# Patient Record
Sex: Male | Born: 1945
Health system: Southern US, Community
[De-identification: ages and names within clinical notes are randomized; demographics above are authoritative.]

## PROBLEM LIST (undated history)

## (undated) ENCOUNTER — Emergency Department (HOSPITAL_BASED_OUTPATIENT_CLINIC_OR_DEPARTMENT_OTHER): Payer: Medicare Other

## (undated) DIAGNOSIS — G709 Myoneural disorder, unspecified: Secondary | ICD-10-CM

## (undated) DIAGNOSIS — L4050 Arthropathic psoriasis, unspecified: Secondary | ICD-10-CM

## (undated) DIAGNOSIS — H269 Unspecified cataract: Secondary | ICD-10-CM

## (undated) DIAGNOSIS — E785 Hyperlipidemia, unspecified: Secondary | ICD-10-CM

## (undated) DIAGNOSIS — K5790 Diverticulosis of intestine, part unspecified, without perforation or abscess without bleeding: Secondary | ICD-10-CM

## (undated) DIAGNOSIS — J40 Bronchitis, not specified as acute or chronic: Secondary | ICD-10-CM

## (undated) DIAGNOSIS — E669 Obesity, unspecified: Secondary | ICD-10-CM

## (undated) DIAGNOSIS — E079 Disorder of thyroid, unspecified: Secondary | ICD-10-CM

## (undated) DIAGNOSIS — T7840XA Allergy, unspecified, initial encounter: Secondary | ICD-10-CM

## (undated) DIAGNOSIS — Z842 Family history of other diseases of the genitourinary system: Secondary | ICD-10-CM

## (undated) DIAGNOSIS — I10 Essential (primary) hypertension: Secondary | ICD-10-CM

## (undated) DIAGNOSIS — Z8639 Personal history of other endocrine, nutritional and metabolic disease: Secondary | ICD-10-CM

## (undated) DIAGNOSIS — M069 Rheumatoid arthritis, unspecified: Secondary | ICD-10-CM

## (undated) DIAGNOSIS — K219 Gastro-esophageal reflux disease without esophagitis: Secondary | ICD-10-CM

## (undated) DIAGNOSIS — N189 Chronic kidney disease, unspecified: Secondary | ICD-10-CM

## (undated) DIAGNOSIS — G4730 Sleep apnea, unspecified: Secondary | ICD-10-CM

## (undated) HISTORY — DX: Unspecified cataract: H26.9

## (undated) HISTORY — DX: Gastro-esophageal reflux disease without esophagitis: K21.9

## (undated) HISTORY — PX: INGUINAL HERNIA REPAIR: SHX194

## (undated) HISTORY — DX: Personal history of other endocrine, nutritional and metabolic disease: Z86.39

## (undated) HISTORY — DX: Sleep apnea, unspecified: G47.30

## (undated) HISTORY — DX: Essential (primary) hypertension: I10

## (undated) HISTORY — DX: Rheumatoid arthritis, unspecified: M06.9

## (undated) HISTORY — DX: Myoneural disorder, unspecified: G70.9

## (undated) HISTORY — PX: KNEE SURGERY: SHX244

## (undated) HISTORY — DX: Allergy, unspecified, initial encounter: T78.40XA

## (undated) HISTORY — DX: Chronic kidney disease, unspecified: N18.9

## (undated) HISTORY — PX: UMBILICAL HERNIA REPAIR: SHX196

## (undated) HISTORY — PX: TRANSURETHRAL RESECTION OF PROSTATE: SHX73

---

## 2002-05-02 ENCOUNTER — Ambulatory Visit (HOSPITAL_COMMUNITY): Admission: RE | Admit: 2002-05-02 | Discharge: 2002-05-02 | Payer: Self-pay | Admitting: *Deleted

## 2002-05-02 ENCOUNTER — Encounter (INDEPENDENT_AMBULATORY_CARE_PROVIDER_SITE_OTHER): Payer: Self-pay | Admitting: Specialist

## 2010-04-16 ENCOUNTER — Ambulatory Visit (HOSPITAL_COMMUNITY): Admission: RE | Admit: 2010-04-16 | Discharge: 2010-04-16 | Payer: Self-pay | Admitting: Cardiology

## 2010-10-15 ENCOUNTER — Ambulatory Visit: Payer: Self-pay | Admitting: Cardiology

## 2010-11-20 ENCOUNTER — Ambulatory Visit: Payer: Self-pay | Admitting: Cardiology

## 2011-04-24 NOTE — Procedures (Signed)
[place]. [facility]  Patient:    Martin Rubio, Martin Rubio Visit Number: [id number] MRN: [id number]          Service Type: END Location: ENDO Attending Physician:  Jim Desanctis Dictated by:   Jim Desanctis, M.D. Proc. Date: 05/02/02 Admit Date:  05/02/2002                             Procedure Report  PROCEDURE PERFORMED:  Colonoscopy.  ENDOSCOPIST:  Jim Desanctis, M.D.  INDICATIONS FOR PROCEDURE:  Hemoccult positivity.  ANESTHESIA:  Demerol 20 mg, Versed 2 mg.  DESCRIPTION OF PROCEDURE:  With the patient mildly sedated in the left lateral decubitus position, a rectal exam was performed which was unremarkable. Subsequently, the Olympus videoscopic colonoscope was inserted in the rectum and passed under direct vision into the cecum, identified by the ileocecal valve and appendiceal orifice.  On the ileocecal valve was a small ulcer that was photographed and biopsied.  We entered into the terminal ileum which appeared normal and was photographed.  From this point, the colonoscope was slowly withdrawn, taking circumferential views of the entire colonic mucosa. stopping only then in the rectum, which appeared normal on direct and showed hemorrhoids on retroflex view.  The endoscope was straightened and withdrawn. Patients vital signs and pulse oximeter remained stable.  The patient tolerated the procedure well and without apparent complications.  FINDINGS:  Internal hemorrhoids plus ulcer on the ileocecal valve.  Await biopsy report.  The patient will call me for results and follow up with me as an outpatient. Dictated by:   Jim Desanctis, M.D. Attending Physician:  Jim Desanctis DD:  05/02/02 TD:  05/03/02 Job: [id number]

## 2011-04-24 NOTE — Procedures (Signed)
[place]. [facility]  Patient:    Martin Rubio, Martin Rubio Visit Number: [id number] MRN: [id number]          Service Type: END Location: ENDO Attending Physician:  Jim Desanctis Dictated by:   Jim Desanctis, M.D. Admit Date:  05/02/2002 Discharge Date: 05/02/2002                             Procedure Report  PROCEDURE:  Upper endoscopy.  INDICATIONS:  Hemoccult positivity.  ANESTHESIA:  Demerol 60 and Versed 6 mg.  PROCEDURE: With the patient mildly sedated in the left lateral decubitus position, the Olympus videoscopic endoscope was inserted and passed under direct vision through the esophagus which appeared normal and to the stomach which appeared normal.  The fundus, body, antrum, duodenal bulb, and second portion of the duodenum all appeared normal.  From this point, the scope was slowly withdrawn, taking circumferential views in the entire duodenal mucosa. The endoscope was then pulled back into the stomach and placed in retroflexion, viewing the stomach from below.  The endoscope was then straightened and withdrawn, taking circumferential views in the entire gastric and esophageal mucosa.  The patients vital signs and pulse oximeter remained stable.  The patient tolerated the procedure well without apparent complications.  FINDINGS:  Essentially negative endoscopic examination.  PLAN:  Proceed to colonoscopy. Dictated by:   Jim Desanctis, M.D. Attending Physician:  Jim Desanctis DD:  05/02/02 TD:  05/03/02 Job: [id number]

## 2011-06-09 DIAGNOSIS — N138 Other obstructive and reflux uropathy: Secondary | ICD-10-CM | POA: Insufficient documentation

## 2011-06-09 DIAGNOSIS — E890 Postprocedural hypothyroidism: Secondary | ICD-10-CM | POA: Insufficient documentation

## 2011-06-09 DIAGNOSIS — L4050 Arthropathic psoriasis, unspecified: Secondary | ICD-10-CM | POA: Insufficient documentation

## 2011-06-09 DIAGNOSIS — E039 Hypothyroidism, unspecified: Secondary | ICD-10-CM | POA: Insufficient documentation

## 2011-06-09 DIAGNOSIS — N400 Enlarged prostate without lower urinary tract symptoms: Secondary | ICD-10-CM | POA: Insufficient documentation

## 2011-07-07 DIAGNOSIS — IMO0001 Reserved for inherently not codable concepts without codable children: Secondary | ICD-10-CM | POA: Insufficient documentation

## 2011-07-29 LAB — HM COLONOSCOPY

## 2011-09-01 DIAGNOSIS — K5790 Diverticulosis of intestine, part unspecified, without perforation or abscess without bleeding: Secondary | ICD-10-CM | POA: Insufficient documentation

## 2011-09-20 ENCOUNTER — Encounter: Payer: Self-pay | Admitting: *Deleted

## 2011-09-20 ENCOUNTER — Emergency Department (HOSPITAL_BASED_OUTPATIENT_CLINIC_OR_DEPARTMENT_OTHER)
Admission: EM | Admit: 2011-09-20 | Discharge: 2011-09-20 | Disposition: A | Discharge status: Home / Self Care | Payer: Medicare Other | Attending: Emergency Medicine | Admitting: Emergency Medicine

## 2011-09-20 ENCOUNTER — Other Ambulatory Visit: Payer: Self-pay

## 2011-09-20 DIAGNOSIS — J9801 Acute bronchospasm: Secondary | ICD-10-CM | POA: Insufficient documentation

## 2011-09-20 DIAGNOSIS — R55 Syncope and collapse: Secondary | ICD-10-CM | POA: Insufficient documentation

## 2011-09-20 DIAGNOSIS — Z79899 Other long term (current) drug therapy: Secondary | ICD-10-CM | POA: Insufficient documentation

## 2011-09-20 DIAGNOSIS — J40 Bronchitis, not specified as acute or chronic: Secondary | ICD-10-CM | POA: Insufficient documentation

## 2011-09-20 DIAGNOSIS — R05 Cough: Secondary | ICD-10-CM

## 2011-09-20 DIAGNOSIS — R054 Cough syncope: Secondary | ICD-10-CM

## 2011-09-20 DIAGNOSIS — E669 Obesity, unspecified: Secondary | ICD-10-CM | POA: Insufficient documentation

## 2011-09-20 DIAGNOSIS — R069 Unspecified abnormalities of breathing: Secondary | ICD-10-CM | POA: Insufficient documentation

## 2011-09-20 DIAGNOSIS — E785 Hyperlipidemia, unspecified: Secondary | ICD-10-CM | POA: Insufficient documentation

## 2011-09-20 HISTORY — DX: Disorder of thyroid, unspecified: E07.9

## 2011-09-20 HISTORY — DX: Bronchitis, not specified as acute or chronic: J40

## 2011-09-20 HISTORY — DX: Arthropathic psoriasis, unspecified: L40.50

## 2011-09-20 HISTORY — DX: Diverticulosis of intestine, part unspecified, without perforation or abscess without bleeding: K57.90

## 2011-09-20 HISTORY — DX: Obesity, unspecified: E66.9

## 2011-09-20 HISTORY — DX: Family history of other diseases of the genitourinary system: Z84.2

## 2011-09-20 HISTORY — DX: Hyperlipidemia, unspecified: E78.5

## 2011-09-20 LAB — BASIC METABOLIC PANEL
CO2: 26 mEq/L (ref 19–32)
Calcium: 9.2 mg/dL (ref 8.4–10.5)
Chloride: 102 mEq/L (ref 96–112)
Glucose, Bld: 98 mg/dL (ref 70–99)
Sodium: 138 mEq/L (ref 135–145)

## 2011-09-20 LAB — CBC
HCT: 41.5 % (ref 39.0–52.0)
MCV: 85.2 fL (ref 78.0–100.0)
Platelets: 140 10*3/uL — ABNORMAL LOW (ref 150–400)
RBC: 4.87 MIL/uL (ref 4.22–5.81)
WBC: 6.5 10*3/uL (ref 4.0–10.5)

## 2011-09-20 LAB — DIFFERENTIAL
Eosinophils Relative: 3 % (ref 0–5)
Lymphocytes Relative: 24 % (ref 12–46)
Lymphs Abs: 1.5 10*3/uL (ref 0.7–4.0)

## 2011-09-20 LAB — TROPONIN I: Troponin I: <0.3 ng/mL (ref <0.30)

## 2011-09-20 MED ORDER — IPRATROPIUM BROMIDE 0.02 % IN SOLN
0.5000 mg | Freq: Once | RESPIRATORY_TRACT | Status: AC
  Administered 2011-09-20: 0.5 mg via RESPIRATORY_TRACT
  Filled 2011-09-20: qty 2.5

## 2011-09-20 MED ORDER — METHYLPREDNISOLONE SODIUM SUCC 125 MG IJ SOLR
125.0000 mg | Freq: Once | INTRAMUSCULAR | Status: AC
  Administered 2011-09-20: 125 mg via INTRAVENOUS
  Filled 2011-09-20: qty 2

## 2011-09-20 MED ORDER — AZITHROMYCIN 250 MG PO TABS
500.0000 mg | ORAL_TABLET | Freq: Once | ORAL | Status: AC
  Administered 2011-09-20: 500 mg via ORAL
  Filled 2011-09-20: qty 2

## 2011-09-20 MED ORDER — ALBUTEROL SULFATE (5 MG/ML) 0.5% IN NEBU
5.0000 mg | INHALATION_SOLUTION | Freq: Once | RESPIRATORY_TRACT | Status: AC
  Administered 2011-09-20: 5 mg via RESPIRATORY_TRACT
  Filled 2011-09-20: qty 1

## 2011-09-20 MED ORDER — ALBUTEROL SULFATE HFA 108 (90 BASE) MCG/ACT IN AERS
2.0000 | INHALATION_SPRAY | RESPIRATORY_TRACT | Status: DC | PRN
  Administered 2011-09-20: 2 via RESPIRATORY_TRACT
  Filled 2011-09-20: qty 6.7

## 2011-09-20 NOTE — ED Provider Notes (Signed)
Scribed for Kathalene Frames, MD, the patient was seen in room MH01/MH01 . This chart was scribed by Glory Buff. This patient's care was started at 7:37 PM.   CSN: [id number] Arrival date & time: 09/20/2011  7:38 PM  Chief Complaint  Patient presents with  . Cough    (Consider location/radiation/quality/duration/timing/severity/associated sxs/prior treatment) HPI Martin Rubio is a 65 y.o. male who presents to the Emergency Department complaining of cough. Pt reports cough for the past week becoming progressively worse with associated wheezing and syncopal episodes. Pt reports coughing spells are intermittent and severe causing him to pass out at times. Pt was seen at [facility] on the 8th (6 days ago) and was Dx with bronchitis and Rx'd amoxicillin. Pt reports his cough has not improved with medications. Pt also has treated with mucinex and Delcin cough syrup with little relief. Pt returned to [facility] today received a breathing treatment and xray and a prescription for albuterol inhaler, Zpak, and prednisone which have not been filled yet. Marland Kitchen Pt came to ED tonight because the syncopal episodes are becoming worse. Additionally pt reports an HA. After driving home from the urgent care, patient had a near syncopal episode in the car.   Past Medical History  Diagnosis Date  . Bronchitis   . Thyroid disease   . Psoriatic arthritis   . FH: BPH (benign prostatic hypertrophy)   . Obesity   . Hyperlipidemia   . Diverticulosis   . White coat hypertension     Past Surgical History  Procedure Date  . Knee surgery     No family history on file.  History  Substance Use Topics  . Smoking status: Former Research scientist (life sciences)  . Smokeless tobacco: Not on file  . Alcohol Use: No    Review of Systems  Constitutional: Negative for fever.  Respiratory: Positive for cough and wheezing.   All other systems reviewed and are negative.   Allergies  Review of patient's allergies indicates not on file.  Home  Medications   Current Outpatient Rx  Name Route Sig Dispense Refill  . DELSYM PO Oral Take 30 mLs by mouth every 4 (four) hours as needed. For cough     . DOXAZOSIN MESYLATE 4 MG PO TABS Oral Take 4 mg by mouth at bedtime.      Marland Kitchen ETANERCEPT 50 MG/ML [place] SOLN Subcutaneous Inject 50 mg into the skin once a week. Give on Wednesday     . EZETIMIBE 10 MG PO TABS Oral Take 10 mg by mouth daily.      Marland Kitchen FLUTICASONE PROPIONATE 50 MCG/ACT NA SUSP Nasal Place 1 spray into the nose once.      Marland Kitchen FOLIC ACID A999333 MCG PO TABS Oral Take 400 mcg by mouth daily.      Marland Kitchen GLUCOSAMINE COMPLEX PO Oral Take 1 tablet by mouth every other day.      . GUAIFENESIN 600 MG PO TB12 Oral Take 600 mg by mouth every 4 (four) hours as needed. For congestion     . LANSOPRAZOLE 30 MG PO CPDR Oral Take 30 mg by mouth daily.      Marland Kitchen LEVOTHYROXINE SODIUM 150 MCG PO TABS Oral Take 150 mcg by mouth daily. Take every day except on Sunday     . ONE-DAILY MULTI VITAMINS PO TABS Oral Take 1 tablet by mouth daily.      Marland Kitchen METHOTREXATE (ANTI-RHEUMATIC) 2.5 MG PO TABS Oral Take 10 mg by mouth every 7 (seven) days. Take on Sunday  BP 134/67  Pulse 69  Temp(Src) 98.1 F (36.7 C) (Oral)  Resp 20  Ht 5\' 10"  (1.778 m)  Wt 230 lb (104.327 kg)  BMI 33.00 kg/m2  SpO2 96%  Physical Exam  Nursing note and vitals reviewed. Constitutional: He appears well-developed and well-nourished. No distress.  HENT:  Head: Normocephalic and atraumatic.  Right Ear: External ear normal.  Left Ear: External ear normal.  Eyes: Conjunctivae are normal. Right eye exhibits no discharge. Left eye exhibits no discharge. No scleral icterus.  Neck: Neck supple. No tracheal deviation present.  Cardiovascular: Normal rate, regular rhythm and intact distal pulses.   Pulmonary/Chest: Effort normal. No stridor. No respiratory distress. He has wheezes. He has no rales.       Wheezing on expiration with no retractions or labored breathing. Occasional coughing spells.   Abdominal: Soft. Bowel sounds are normal. He exhibits no distension. There is no tenderness. There is no rebound and no guarding.  Musculoskeletal: He exhibits no edema and no tenderness.  Neurological: He is alert. He has normal strength. No sensory deficit. Cranial nerve deficit:  no gross defecits noted. He exhibits normal muscle tone. He displays no seizure activity. Coordination normal.  Skin: Skin is warm and dry. No rash noted.  Psychiatric: He has a normal mood and affect.    ED Course  Procedures (including critical care time)  Date: 09/20/2011  Rate: 66  Rhythm: normal sinus rhythm  QRS Axis: left  Intervals: normal  ST/T Wave abnormalities: nonspecific T wave changes  Conduction Disutrbances:first-degree A-V block , left axis deviation  Narrative Interpretation:   Old EKG Reviewed: none available   OTHER DATA REVIEWED: Nursing notes, vital signs, and past medical records reviewed.  DIAGNOSTIC STUDIES: Oxygen Saturation is 96% on room air, normal by my interpretation.    LABS / RADIOLOGY:  Labs Reviewed  CBC - Abnormal; Notable for the following:    Platelets 140 (*)    All other components within normal limits  DIFFERENTIAL - Abnormal; Notable for the following:    Monocytes Relative 14 (*)    All other components within normal limits  BASIC METABOLIC PANEL - Abnormal; Notable for the following:    Potassium 3.4 (*)    GFR calc non Af Amer 61 (*)    GFR calc Af Amer 71 (*)    All other components within normal limits  PRO B NATRIURETIC PEPTIDE  TROPONIN I    ED COURSE / COORDINATION OF CARE: While in the emergency department patient had another brief presyncopal episode after coughing. No dysrhythmias noted on the monitor. 9:24 PM Patient is feeling better he did like to remain here for a period of time longer. While in the ED he'll be given an albuterol inhaler and a dose of Zithromax which is what the urgent care doctor prescribed for him but he had not  been able to pick it up yet.  MDM: Patient appears to having a COPD-type exacerbation with cough syncope. Patient had a chest x-ray at the outpatient facility just prior to arrival. That was not repeated today. Patient has had numerous coughing type spells we will then had syncopal episodes. Pertussis is a consideration. Apparently patient was given a prescription for azithromycin.  I discussed possible admission with the patient considering a syncopal episode. Patient states he is actually feeling better and preferred not to be admitted to the hospital. Overall I think is low risk for any type of life-threatening acute cardiac event   MEDICATIONS GIVEN  IN THE E.D.  Medications  albuterol (PROVENTIL HFA;VENTOLIN HFA) 108 (90 BASE) MCG/ACT inhaler 2 puff (2 puff Inhalation Given 09/20/11 2145)  ipratropium (ATROVENT) nebulizer solution 0.5 mg (0.5 mg Nebulization Given 09/20/11 2034)  albuterol (PROVENTIL) (5 MG/ML) 0.5% nebulizer solution 5 mg (5 mg Nebulization Given 09/20/11 2034)  methylPREDNISolone sodium succinate (SOLU-MEDROL) 125 MG injection 125 mg (125 mg Intravenous Given 09/20/11 2055)  azithromycin (ZITHROMAX) tablet 500 mg (500 mg Oral Given 09/20/11 2142)    SCRIBE ATTESTATION: I personally performed the services described in this documentation, which was scribed in my presence.  The recorded information has been reviewed and considered.         Kathalene Frames, MD 09/20/11 2202

## 2011-09-20 NOTE — ED Notes (Signed)
Pt was seen at [facility]) on the 8th and diagnosed with bronchitis. Rx'd Albuterol inh, Zpak and Prednisone, but he did not get filled yet. Had repeat xray today. ?Passed out in car per wife, so she brought him here. Pt alert, oriented at present. Responding appropriately and following commands.

## 2011-10-28 ENCOUNTER — Other Ambulatory Visit: Payer: Self-pay | Admitting: Cardiology

## 2012-01-06 ENCOUNTER — Other Ambulatory Visit: Payer: Self-pay | Admitting: Nurse Practitioner

## 2012-09-15 ENCOUNTER — Encounter: Payer: Self-pay | Admitting: Cardiology

## 2012-12-09 ENCOUNTER — Encounter: Payer: Self-pay | Admitting: Cardiology

## 2012-12-22 ENCOUNTER — Encounter: Payer: Self-pay | Admitting: Cardiology

## 2012-12-22 ENCOUNTER — Ambulatory Visit (INDEPENDENT_AMBULATORY_CARE_PROVIDER_SITE_OTHER): Payer: Medicare Other | Admitting: Cardiology

## 2012-12-22 VITALS — BP 145/83 | HR 71 | Ht 70.0 in | Wt 220.0 lb

## 2012-12-22 DIAGNOSIS — R5381 Other malaise: Secondary | ICD-10-CM

## 2012-12-22 DIAGNOSIS — I517 Cardiomegaly: Secondary | ICD-10-CM

## 2012-12-22 DIAGNOSIS — D649 Anemia, unspecified: Secondary | ICD-10-CM

## 2012-12-22 DIAGNOSIS — R5383 Other fatigue: Secondary | ICD-10-CM

## 2012-12-22 LAB — CBC
Hemoglobin: 15.1 g/dL (ref 13.0–17.0)
MCHC: 33 g/dL (ref 30.0–36.0)
Platelets: 170 10*3/uL (ref 150.0–400.0)
RBC: 5.19 Mil/uL (ref 4.22–5.81)
WBC: 7.8 10*3/uL (ref 4.5–10.5)

## 2012-12-22 NOTE — Progress Notes (Signed)
HPI The patient has previously been seen by Dr. Doreatha Lew. He has a history of hypertensive heart disease. He has not been seen in several years. He did have some LVH with systolic anterior motion of the mitral valve previously. Her last echo that I see was in 2010. I saw a stress perfusion study in 2011. I actually reviewed these images and I see no evidence of ischemia or infarct on this study. He now presents for evaluation and followup. He was seen in 2012 in the emergency room apparently with syncope with cough.  He presents for followup but predominantly for evaluation of fatigue. This has been slowly progressive.  He reports tiredness during the day. He says he sleeps about 7 hours with CPAP. He does not describe new shortness of breath though he might get dyspneic walking up an incline with his dog. He does not describe chest pressure, neck or arm discomfort. He does not describe palpitations, presyncope or syncope. He does not have any PND or orthopnea. He's not having any weight gain other than slowly progressive or edema.  No Known Allergies  Current Outpatient Prescriptions  Medication Sig Dispense Refill  . Dextromethorphan Polistirex (DELSYM PO) Take 30 mLs by mouth every 4 (four) hours as needed. For cough       . fluticasone (FLONASE) 50 MCG/ACT nasal spray Place 1 spray into the nose once.        . folic acid (FOLVITE) A999333 MCG tablet Take 400 mcg by mouth daily.        . Glucosamine HCl-MSM 1500-500 MG/30ML LIQD Take by mouth as needed.      Marland Kitchen guaiFENesin (MUCINEX) 600 MG 12 hr tablet Take 600 mg by mouth every 4 (four) hours as needed. For congestion       . lansoprazole (PREVACID) 30 MG capsule Take 30 mg by mouth daily.        Marland Kitchen levothyroxine (SYNTHROID, LEVOTHROID) 150 MCG tablet Take 150 mcg by mouth daily. Take every day except on Sunday       . methotrexate (RHEUMATREX) 2.5 MG tablet Take 10 mg by mouth every 7 (seven) days. Take on Sunday      . Multiple Vitamin  (MULTIVITAMIN) tablet Take 1 tablet by mouth daily.        . sodium chloride 0.9 % SOLN with inFLIXimab 100 MG SOLR Inject into the vein every 6 (six) weeks.      . tadalafil (CIALIS) 5 MG tablet Take 5 mg by mouth daily.      . Tamsulosin HCl (FLOMAX) 0.4 MG CAPS Take 0.4 mg by mouth daily.        Past Medical History  Diagnosis Date  . Bronchitis   . Thyroid disease   . Psoriatic arthritis   . FH: BPH (benign prostatic hypertrophy)   . Obesity   . Hyperlipidemia   . Diverticulosis   . White coat hypertension   . Sleep apnea     CPAP    Past Surgical History  Procedure Date  . Knee surgery     bilateral arthroscopic  . Inguinal hernia repair     bilateral  . Umbilical hernia repair    ROS:    PHYSICAL EXAM BP 145/83  Pulse 71  Ht 5\' 10"  (1.778 m)  Wt 220 lb (99.791 kg)  BMI 31.57 kg/m2 GENERAL:  Well appearing HEENT:  Pupils equal round and reactive, fundi not visualized, oral mucosa unremarkable NECK:  No jugular venous distention, waveform within normal  limits, carotid upstroke brisk and symmetric, no bruits, no thyromegaly LYMPHATICS:  No cervical, inguinal adenopathy LUNGS:  Clear to auscultation bilaterally BACK:  No CVA tenderness CHEST:  Unremarkable HEART:  PMI not displaced or sustained,S1 and S2 within normal limits, no S3, no S4, no clicks, no rubs, apical systolic mid peaking radiating out the aortic outflow tract and increasing with a strain phase of Valsalva systolic murmur, no diastolic murmurs ABD:  Flat, positive bowel sounds normal in frequency in pitch, no bruits, no rebound, no guarding, no midline pulsatile mass, no hepatomegaly, no splenomegaly, obese well-healed surgical scar EXT:  2 plus pulses throughout, no edema, no cyanosis no clubbing SKIN:  No rashes no nodules NEURO:  Cranial nerves II through XII grossly intact, motor grossly intact throughout [facility]:  Cognitively intact, oriented to person place and time  EKG:  Sinus rhythm, rate 71,  left axis deviation, left into the hypertrophy, probable repolarization changes, early transition in lead V2. 12/22/2012  ASSESSMENT AND PLAN  Left ventricular hypertrophy The patient does have a systolic dynamic murmur. He will need an echocardiogram to further evaluate this. We did discuss the physiology of this and treatment with blood pressure and fluid restriction and salt restriction.  Fatigue I'm trying to check with his primary provider to see what recent lab work he might have. I would like to make sure he is not anemic. I'm sure his TSH has been followed. He would like a B12 and B6 drawn.  Overweight We had a long discussion about this and I gave him very specific instructions on diet and exercise.  Hypertension  His blood pressure is usually well controlled. We discussed therapeutic lifestyle changes and weight loss

## 2012-12-22 NOTE — Patient Instructions (Addendum)
The current medical regimen is effective;  continue present plan and medications.  Your physician has requested that you have an echocardiogram. Echocardiography is a painless test that uses sound waves to create images of your heart. It provides your doctor with information about the size and shape of your heart and how well your heart's chambers and valves are working. This procedure takes approximately one hour. There are no restrictions for this procedure.  Please have blood work today  Follow up in 3 months with Dr [name]

## 2012-12-25 DIAGNOSIS — R5383 Other fatigue: Secondary | ICD-10-CM | POA: Insufficient documentation

## 2012-12-25 DIAGNOSIS — I517 Cardiomegaly: Secondary | ICD-10-CM | POA: Insufficient documentation

## 2012-12-28 ENCOUNTER — Other Ambulatory Visit (HOSPITAL_COMMUNITY): Payer: Medicare Other

## 2012-12-29 ENCOUNTER — Ambulatory Visit (HOSPITAL_COMMUNITY): Payer: Medicare Other | Attending: Cardiology

## 2012-12-29 DIAGNOSIS — I369 Nonrheumatic tricuspid valve disorder, unspecified: Secondary | ICD-10-CM | POA: Insufficient documentation

## 2012-12-29 DIAGNOSIS — I379 Nonrheumatic pulmonary valve disorder, unspecified: Secondary | ICD-10-CM | POA: Insufficient documentation

## 2012-12-29 DIAGNOSIS — R011 Cardiac murmur, unspecified: Secondary | ICD-10-CM | POA: Insufficient documentation

## 2012-12-29 DIAGNOSIS — R5383 Other fatigue: Secondary | ICD-10-CM

## 2012-12-29 DIAGNOSIS — I080 Rheumatic disorders of both mitral and aortic valves: Secondary | ICD-10-CM | POA: Insufficient documentation

## 2012-12-29 NOTE — Progress Notes (Signed)
Echocardiogram performed.  

## 2013-01-04 ENCOUNTER — Telehealth: Payer: Self-pay | Admitting: Cardiology

## 2013-01-04 NOTE — Telephone Encounter (Signed)
New Problem    Pt is calling about results from echo done last week.

## 2013-01-04 NOTE — Telephone Encounter (Signed)
Called pt with results of his 2 D Echo  He states understanding.

## 2013-01-21 ENCOUNTER — Other Ambulatory Visit: Payer: Self-pay

## 2013-02-14 ENCOUNTER — Encounter: Payer: Self-pay | Admitting: Cardiology

## 2013-03-04 DIAGNOSIS — R509 Fever, unspecified: Secondary | ICD-10-CM | POA: Insufficient documentation

## 2013-03-30 ENCOUNTER — Encounter: Payer: Self-pay | Admitting: Cardiology

## 2013-03-31 ENCOUNTER — Encounter: Payer: Self-pay | Admitting: Cardiology

## 2013-04-24 ENCOUNTER — Telehealth: Payer: Self-pay | Admitting: Cardiology

## 2013-04-24 NOTE — Telephone Encounter (Signed)
Have been having problems with BP being elevated.  States its been all over the place.  140-150 to 132/? Being the lowest.  Advised he is not at a dangerous level to be concerned about immediate risk for stroke.  His PCP did not want to make medication changes but suggested he have an echocardiogram (pt just had one in 12/2012 which was sent to his PCP thru [facility])  Pt is not taking anything for his BP.  He is on Tamsulosin which he reports has been helping with his BP.  He was advised to keep a dairy of BP's and come in for evaluation at appt given 6/4  He is in agreement.

## 2013-04-24 NOTE — Telephone Encounter (Signed)
New Problem:    PAtient called in because he saw his GP and his BP has been erratic and he was advised to seen his cardiologist and have an ECHO soon.  Please call back.

## 2013-05-02 ENCOUNTER — Ambulatory Visit: Payer: Medicare Other | Admitting: Cardiology

## 2013-05-04 ENCOUNTER — Ambulatory Visit (INDEPENDENT_AMBULATORY_CARE_PROVIDER_SITE_OTHER): Payer: Medicare Other | Admitting: Cardiology

## 2013-05-04 ENCOUNTER — Encounter: Payer: Self-pay | Admitting: Cardiology

## 2013-05-04 VITALS — BP 150/100 | HR 69 | Ht 70.0 in | Wt 224.1 lb

## 2013-05-04 DIAGNOSIS — I517 Cardiomegaly: Secondary | ICD-10-CM

## 2013-05-04 DIAGNOSIS — R5381 Other malaise: Secondary | ICD-10-CM

## 2013-05-04 DIAGNOSIS — R5383 Other fatigue: Secondary | ICD-10-CM

## 2013-05-04 MED ORDER — AMLODIPINE BESYLATE 5 MG PO TABS: 5.0000 mg | ORAL_TABLET | Freq: Every day | ORAL | Status: DC

## 2013-05-04 NOTE — Progress Notes (Signed)
HPI The patient has previously been seen by Dr. Doreatha Lew. He has a history of hypertensive heart disease. Plan I first saw him it was predominantly for evaluation of fatigue.  Because of a history of LVH I sent him for an echocardiogram.  This confirmed some LVH with septal hypertrophy. He actually doesn't recall having an echo when I reviewed it with him today. We had called him with results. He's been keeping an electronic blood pressure record and his blood pressures are significantly elevated. He has no new symptoms but continues to have fatigue as his predominant complaints.  No Known Allergies  Current Outpatient Prescriptions  Medication Sig Dispense Refill  . Dextromethorphan Polistirex (DELSYM PO) Take 30 mLs by mouth every 4 (four) hours as needed. For cough       . fluticasone (FLONASE) 50 MCG/ACT nasal spray Place 1 spray into the nose once.        . folic acid (FOLVITE) A999333 MCG tablet Take 400 mcg by mouth daily.        . Glucosamine HCl-MSM 1500-500 MG/30ML LIQD Take by mouth as needed.      Marland Kitchen guaiFENesin (MUCINEX) 600 MG 12 hr tablet Take 600 mg by mouth every 4 (four) hours as needed. For congestion       . lansoprazole (PREVACID) 30 MG capsule Take 30 mg by mouth daily.        Marland Kitchen levothyroxine (SYNTHROID, LEVOTHROID) 150 MCG tablet Take 150 mcg by mouth daily. Take every day except on Sunday       . methotrexate (RHEUMATREX) 2.5 MG tablet Take 10 mg by mouth every 7 (seven) days. Take on Sunday      . Multiple Vitamin (MULTIVITAMIN) tablet Take 1 tablet by mouth daily.        . sodium chloride 0.9 % SOLN with inFLIXimab 100 MG SOLR Inject into the vein every 6 (six) weeks.      . tadalafil (CIALIS) 5 MG tablet Take 5 mg by mouth daily.      . Tamsulosin HCl (FLOMAX) 0.4 MG CAPS Take 0.4 mg by mouth daily.      Marland Kitchen zolpidem (AMBIEN) 5 MG tablet 5 mg at bedtime as needed.        No current facility-administered medications for this visit.    Past Medical History  Diagnosis  Date  . Bronchitis   . Thyroid disease   . Psoriatic arthritis   . FH: BPH (benign prostatic hypertrophy)   . Obesity   . Hyperlipidemia   . Diverticulosis   . White coat hypertension   . Sleep apnea     CPAP    Past Surgical History  Procedure Laterality Date  . Knee surgery      bilateral arthroscopic  . Inguinal hernia repair      bilateral  . Umbilical hernia repair     ROS:  As stated in the HPI and negative for all other systems.  PHYSICAL EXAM BP 150/100  Pulse 69  Ht 5\' 10"  (1.778 m)  Wt 224 lb 1.9 oz (101.66 kg)  BMI 32.16 kg/m2 GENERAL:  Well appearing NECK:  No jugular venous distention, waveform within normal limits, carotid upstroke brisk and symmetric, no bruits, no thyromegaly LUNGS:  Clear to auscultation bilaterally CHEST:  Unremarkable HEART:  PMI not displaced or sustained,S1 and S2 within normal limits, no S3, no S4, no clicks, no rubs, apical systolic mid peaking radiating out the aortic outflow tract and increasing with a strain phase  of Valsalva systolic murmur, no diastolic murmurs ABD:  Flat, positive bowel sounds normal in frequency in pitch, no bruits, no rebound, no guarding, no midline pulsatile mass, no hepatomegaly, no splenomegaly, obese well-healed surgical scar EXT:  2 plus pulses throughout, no edema, no cyanosis no clubbing  EKG:  Sinus rhythm, rate 71, left axis deviation, left into the hypertrophy, probable repolarization changes, early transition in lead V2. 05/04/2013  ASSESSMENT AND PLAN  Left ventricular hypertrophy We previously reviewed this.I reviewed the echocardiogram with the patient. I will followup with an echo in one year.  Fatigue He reports an extensive workup by his primary provider. This is probably in part related to on treated sleep apnea since he cannot use the mask.  Overweight I have prescribed the [employer].  Hypertension  His blood pressure is elevated We discussed therapeutic lifestyle changes and  weight loss. I will add Norvasc 5 mg daily.

## 2013-05-04 NOTE — Patient Instructions (Addendum)
Please start Amlodipine 5 mg a day. Continue all other medications as listed  Follow up with Dr [name] in 2 months.

## 2013-05-10 ENCOUNTER — Ambulatory Visit: Payer: Medicare Other | Admitting: Cardiology

## 2013-06-22 ENCOUNTER — Ambulatory Visit: Payer: Medicare Other | Admitting: Cardiology

## 2013-06-22 ENCOUNTER — Encounter: Payer: Self-pay | Admitting: Cardiology

## 2013-06-22 ENCOUNTER — Ambulatory Visit (INDEPENDENT_AMBULATORY_CARE_PROVIDER_SITE_OTHER): Payer: Medicare Other | Admitting: Cardiology

## 2013-06-22 VITALS — BP 144/71 | HR 67 | Ht 70.0 in | Wt 202.4 lb

## 2013-06-22 DIAGNOSIS — I517 Cardiomegaly: Secondary | ICD-10-CM

## 2013-06-22 MED ORDER — AMLODIPINE BESYLATE 2.5 MG PO TABS: 2.5000 mg | ORAL_TABLET | Freq: Every day | ORAL | Status: DC

## 2013-06-22 NOTE — Progress Notes (Signed)
HPI The patient returns for follow up of history of hypertensive heart disease. He has a LVH with septal hypertrophy. Since I last saw him he has lost about 21 pounds through the [employer]. He's had laser treatment on his prostate. He's had rounds of antibiotics or pulmonary problems. He's complained of losing his taste and this is possibly related to this. He's had leg cramps. He actually feels better with weight loss. He does have dizziness which he ascribes to the Norvasc. He's not having any presyncope or syncope. He denies any chest pressure, neck or arm discomfort. He's not had any palpitations.  No Known Allergies  Current Outpatient Prescriptions  Medication Sig Dispense Refill  . amLODipine (NORVASC) 5 MG tablet Take 1 tablet (5 mg total) by mouth daily.  30 tablet  11  . fluticasone (FLONASE) 50 MCG/ACT nasal spray Place 1 spray into the nose once. As needed      . folic acid (FOLVITE) A999333 MCG tablet Take 400 mcg by mouth daily.        . Glucosamine HCl-MSM 1500-500 MG/30ML LIQD Take by mouth as needed.      . lansoprazole (PREVACID) 30 MG capsule Take 30 mg by mouth daily.        Marland Kitchen levothyroxine (SYNTHROID, LEVOTHROID) 150 MCG tablet Take 150 mcg by mouth daily. Take every day except on Sunday       . methotrexate (RHEUMATREX) 2.5 MG tablet Take 10 mg by mouth every 7 (seven) days. Take on Sunday      . Multiple Vitamin (MULTIVITAMIN) tablet Take 1 tablet by mouth daily.        . sodium chloride 0.9 % SOLN with inFLIXimab 100 MG SOLR Inject into the vein every 6 (six) weeks.      . tadalafil (CIALIS) 5 MG tablet Take 5 mg by mouth daily.      . Tamsulosin HCl (FLOMAX) 0.4 MG CAPS Take 0.4 mg by mouth daily.       No current facility-administered medications for this visit.    Past Medical History  Diagnosis Date  . Bronchitis   . Thyroid disease   . Psoriatic arthritis   . FH: BPH (benign prostatic hypertrophy)   . Obesity   . Hyperlipidemia   . Diverticulosis   .  White coat hypertension   . Sleep apnea     CPAP    Past Surgical History  Procedure Laterality Date  . Knee surgery      bilateral arthroscopic  . Inguinal hernia repair      bilateral  . Umbilical hernia repair     ROS:  As stated in the HPI and negative for all other systems.  PHYSICAL EXAM BP 144/71  Pulse 67  Ht 5\' 10"  (1.778 m)  Wt 202 lb 6.4 oz (91.808 kg)  BMI 29.04 kg/m2 GENERAL:  Well appearing NECK:  No jugular venous distention, waveform within normal limits, carotid upstroke brisk and symmetric, no bruits, no thyromegaly LUNGS:  Clear to auscultation bilaterally CHEST:  Unremarkable HEART:  PMI not displaced or sustained,S1 and S2 within normal limits, no S3, no S4, no clicks, no rubs, apical systolic mid peaking radiating out the aortic outflow tract and increasing with a strain phase of Valsalva systolic murmur, no diastolic murmurs ABD:  Flat, positive bowel sounds normal in frequency in pitch, no bruits, no rebound, no guarding, no midline pulsatile mass, no hepatomegaly, no splenomegaly, well-healed surgical scar EXT:  2 plus pulses throughout, no  edema, no cyanosis no clubbing  EKG:  Sinus rhythm, rate 71, left axis deviation, left into the hypertrophy, probable repolarization changes, early transition in lead V2. 06/22/2013  ASSESSMENT AND PLAN  Left ventricular hypertrophy I will repeat an echo when I see him in one year.    Fatigue This is improved with exercise and weight loss.   Overweight I am very proud of his weight loss and I encouraged more of the same.  Hypertension  Given his fall in blood pressure with his weight loss and dizziness we will reduce her Norvasc to 2.5 mg daily.

## 2013-06-22 NOTE — Patient Instructions (Addendum)
Please decrease your Amlodipine to 2.5 mg daily Continue all other medications as listed.  Your physician has requested that you have an echocardiogram in 1 year. Echocardiography is a painless test that uses sound waves to create images of your heart. It provides your doctor with information about the size and shape of your heart and how well your heart's chambers and valves are working. This procedure takes approximately one hour. There are no restrictions for this procedure.  Follow up in 1 year with Dr [name].  You will receive a letter in the mail 2 months before you are due.  Please call us when you receive this letter to schedule your follow up appointment.

## 2013-07-12 ENCOUNTER — Other Ambulatory Visit: Payer: Self-pay

## 2013-09-07 ENCOUNTER — Ambulatory Visit (INDEPENDENT_AMBULATORY_CARE_PROVIDER_SITE_OTHER): Payer: Medicare Other | Admitting: Cardiology

## 2013-09-07 ENCOUNTER — Encounter: Payer: Self-pay | Admitting: Cardiology

## 2013-09-07 VITALS — BP 136/82 | HR 62 | Ht 70.0 in | Wt 180.0 lb

## 2013-09-07 DIAGNOSIS — R0989 Other specified symptoms and signs involving the circulatory and respiratory systems: Secondary | ICD-10-CM

## 2013-09-07 DIAGNOSIS — I517 Cardiomegaly: Secondary | ICD-10-CM

## 2013-09-07 NOTE — Progress Notes (Signed)
HPI The patient returns for follow up of history of hypertensive heart disease. He has a LVH with septal hypertrophy. He has now lost a total of 44 pounds with the [employer]. He feels much better. He's exercising. The patient denies any new symptoms such as chest discomfort, neck or arm discomfort. There has been no new shortness of breath, PND or orthopnea. There have been no reported palpitations, presyncope or syncope.  He does have some knee trouble and is going to get these injected. He took himself off of Norvasc because his blood pressure was running low.  Allergies  Allergen Reactions  . Codeine     Tension, nasty feeling     Current Outpatient Prescriptions  Medication Sig Dispense Refill  . amLODipine (NORVASC) 2.5 MG tablet Take 1 tablet (2.5 mg total) by mouth daily.  30 tablet  11  . fluticasone (FLONASE) 50 MCG/ACT nasal spray Place 1 spray into the nose once. As needed      . folic acid (FOLVITE) A999333 MCG tablet Take 400 mcg by mouth daily.        . Glucosamine HCl-MSM 1500-500 MG/30ML LIQD Take by mouth as needed.      . lansoprazole (PREVACID) 30 MG capsule Take 30 mg by mouth daily.        Marland Kitchen levothyroxine (SYNTHROID, LEVOTHROID) 150 MCG tablet Take 150 mcg by mouth daily. Take every day except on Sunday       . methotrexate (RHEUMATREX) 2.5 MG tablet Take 10 mg by mouth every 7 (seven) days. Take on Sunday      . Multiple Vitamin (MULTIVITAMIN) tablet Take 1 tablet by mouth daily.        . sodium chloride 0.9 % SOLN with inFLIXimab 100 MG SOLR Inject into the vein every 6 (six) weeks.      . tadalafil (CIALIS) 5 MG tablet Take 5 mg by mouth daily as needed.       . Tamsulosin HCl (FLOMAX) 0.4 MG CAPS Take 0.4 mg by mouth daily.       No current facility-administered medications for this visit.    Past Medical History  Diagnosis Date  . Bronchitis   . Thyroid disease   . Psoriatic arthritis   . FH: BPH (benign prostatic hypertrophy)   . Obesity   .  Hyperlipidemia   . Diverticulosis   . White coat hypertension   . Sleep apnea     CPAP    Past Surgical History  Procedure Laterality Date  . Knee surgery      bilateral arthroscopic  . Inguinal hernia repair      bilateral  . Umbilical hernia repair     ROS:  As stated in the HPI and negative for all other systems.  PHYSICAL EXAM BP 136/82  Pulse 62  Ht 5\' 10"  (1.778 m)  Wt 180 lb (81.647 kg)  BMI 25.83 kg/m2 GENERAL:  Well appearing NECK:  No jugular venous distention, waveform within normal limits, carotid upstroke brisk and symmetric, no bruits, no thyromegaly LUNGS:  Clear to auscultation bilaterally CHEST:  Unremarkable HEART:  PMI not displaced or sustained,S1 and S2 within normal limits, no S3, no S4, no clicks, no rubs, apical systolic mid peaking radiating out the aortic outflow tract and increasing with a strain phase of Valsalva systolic murmur, no diastolic murmurs ABD:  Flat, positive bowel sounds normal in frequency in pitch, positive midline bruits, no rebound, no guarding, no midline pulsatile mass, no hepatomegaly, no  splenomegaly, well-healed surgical scar EXT:  2 plus pulses throughout, no edema, no cyanosis no clubbing  EKG:  Sinus rhythm, rate 71, left axis deviation, left into the hypertrophy, probable repolarization changes, early transition in lead V2. 09/07/2013  ASSESSMENT AND PLAN  Left ventricular hypertrophy This was checked on an echo in January. No change in therapy is indicated. No further imaging is indicated at this time. Clinically he has no symptoms related to this.  Bruit He has an abdominal bruit and I will check an ultrasound to rule out abdominal aneurysm.  Overweight He is no longer overweight having lost 44 pounds!  Hypertension  He discontinued the Norvasc on his own and his blood pressure is well controlled. No change in therapy is indicated.

## 2013-09-07 NOTE — Patient Instructions (Addendum)
The current medical regimen is effective;  continue present plan and medications.  Your physician has requested that you have an abdominal aorta duplex. During this test, an ultrasound is used to evaluate the aorta. Allow 30 minutes for this exam. Do not eat after midnight the day before and avoid carbonated beverages  Follow up in 1 year with Dr [name].  You will receive a letter in the mail 2 months before you are due.  Please call us when you receive this letter to schedule your follow up appointment.

## 2013-09-12 ENCOUNTER — Ambulatory Visit (HOSPITAL_COMMUNITY): Payer: Medicare Other | Attending: Cardiology

## 2013-09-12 DIAGNOSIS — I708 Atherosclerosis of other arteries: Secondary | ICD-10-CM | POA: Insufficient documentation

## 2013-09-12 DIAGNOSIS — I77811 Abdominal aortic ectasia: Secondary | ICD-10-CM | POA: Insufficient documentation

## 2013-09-12 DIAGNOSIS — I723 Aneurysm of iliac artery: Secondary | ICD-10-CM | POA: Insufficient documentation

## 2013-09-12 DIAGNOSIS — R0989 Other specified symptoms and signs involving the circulatory and respiratory systems: Secondary | ICD-10-CM | POA: Insufficient documentation

## 2013-09-13 ENCOUNTER — Encounter (HOSPITAL_COMMUNITY): Payer: Medicare Other

## 2013-10-12 ENCOUNTER — Other Ambulatory Visit: Payer: Self-pay

## 2014-06-27 ENCOUNTER — Telehealth: Payer: Self-pay | Admitting: Cardiology

## 2014-06-29 NOTE — Telephone Encounter (Signed)
Closed encounter °

## 2014-08-07 DIAGNOSIS — M1990 Unspecified osteoarthritis, unspecified site: Secondary | ICD-10-CM | POA: Insufficient documentation

## 2014-08-07 DIAGNOSIS — M7750 Other enthesopathy of unspecified foot: Secondary | ICD-10-CM | POA: Insufficient documentation

## 2014-08-07 DIAGNOSIS — R1310 Dysphagia, unspecified: Secondary | ICD-10-CM | POA: Insufficient documentation

## 2014-08-07 DIAGNOSIS — M069 Rheumatoid arthritis, unspecified: Secondary | ICD-10-CM

## 2014-08-07 DIAGNOSIS — M6208 Separation of muscle (nontraumatic), other site: Secondary | ICD-10-CM | POA: Insufficient documentation

## 2014-08-07 DIAGNOSIS — R300 Dysuria: Secondary | ICD-10-CM | POA: Insufficient documentation

## 2014-08-07 DIAGNOSIS — R351 Nocturia: Secondary | ICD-10-CM | POA: Insufficient documentation

## 2014-08-07 DIAGNOSIS — H9319 Tinnitus, unspecified ear: Secondary | ICD-10-CM | POA: Insufficient documentation

## 2014-08-07 DIAGNOSIS — J209 Acute bronchitis, unspecified: Secondary | ICD-10-CM | POA: Insufficient documentation

## 2014-08-07 DIAGNOSIS — N3941 Urge incontinence: Secondary | ICD-10-CM | POA: Insufficient documentation

## 2014-08-07 DIAGNOSIS — R059 Cough, unspecified: Secondary | ICD-10-CM | POA: Insufficient documentation

## 2014-08-07 DIAGNOSIS — R339 Retention of urine, unspecified: Secondary | ICD-10-CM | POA: Insufficient documentation

## 2014-08-07 DIAGNOSIS — N390 Urinary tract infection, site not specified: Secondary | ICD-10-CM | POA: Insufficient documentation

## 2014-08-07 DIAGNOSIS — R5383 Other fatigue: Secondary | ICD-10-CM

## 2014-08-07 DIAGNOSIS — G4733 Obstructive sleep apnea (adult) (pediatric): Secondary | ICD-10-CM | POA: Insufficient documentation

## 2014-08-07 DIAGNOSIS — M24573 Contracture, unspecified ankle: Secondary | ICD-10-CM | POA: Insufficient documentation

## 2014-08-07 DIAGNOSIS — R05 Cough: Secondary | ICD-10-CM | POA: Insufficient documentation

## 2014-08-07 DIAGNOSIS — J310 Chronic rhinitis: Secondary | ICD-10-CM | POA: Insufficient documentation

## 2014-08-07 DIAGNOSIS — R350 Frequency of micturition: Secondary | ICD-10-CM | POA: Insufficient documentation

## 2014-08-07 DIAGNOSIS — K219 Gastro-esophageal reflux disease without esophagitis: Secondary | ICD-10-CM | POA: Insufficient documentation

## 2014-08-07 DIAGNOSIS — M24576 Contracture, unspecified foot: Secondary | ICD-10-CM

## 2014-08-07 DIAGNOSIS — N419 Inflammatory disease of prostate, unspecified: Secondary | ICD-10-CM | POA: Insufficient documentation

## 2014-08-07 DIAGNOSIS — H905 Unspecified sensorineural hearing loss: Secondary | ICD-10-CM | POA: Insufficient documentation

## 2014-08-07 DIAGNOSIS — R5381 Other malaise: Secondary | ICD-10-CM | POA: Insufficient documentation

## 2014-08-07 DIAGNOSIS — G479 Sleep disorder, unspecified: Secondary | ICD-10-CM | POA: Insufficient documentation

## 2014-08-07 DIAGNOSIS — N529 Male erectile dysfunction, unspecified: Secondary | ICD-10-CM | POA: Insufficient documentation

## 2014-08-07 HISTORY — DX: Rheumatoid arthritis, unspecified: M06.9

## 2014-09-10 ENCOUNTER — Other Ambulatory Visit: Payer: Self-pay | Admitting: *Deleted

## 2014-09-10 ENCOUNTER — Ambulatory Visit (INDEPENDENT_AMBULATORY_CARE_PROVIDER_SITE_OTHER): Payer: Medicare Other | Admitting: Cardiology

## 2014-09-10 ENCOUNTER — Encounter: Payer: Self-pay | Admitting: Cardiology

## 2014-09-10 VITALS — BP 155/90 | HR 67 | Ht 70.0 in | Wt 183.0 lb

## 2014-09-10 DIAGNOSIS — I517 Cardiomegaly: Secondary | ICD-10-CM

## 2014-09-10 MED ORDER — METOPROLOL SUCCINATE ER 50 MG PO TB24: 50.0000 mg | ORAL_TABLET | Freq: Every day | ORAL | Status: DC

## 2014-09-10 NOTE — Patient Instructions (Addendum)
Stop taking Amlodipine  Start taking Toprol XL 50 mg Daily  Your physician recommends that you schedule a follow-up appointment in: one year with Dr.Hochrein

## 2014-09-10 NOTE — Progress Notes (Signed)
HPI The patient returns for follow up of history of hypertensive heart disease. He has a LVH with septal hypertrophy. Since I last saw him he has had some increase dizziness that he relates to Norvasc he is getting for his hypertension. He's not describing orthostasis. He's not had increased near-syncope. He denies any palpitations. He's had no new shortness of breath, PND or orthopnea. He has no chest pressure, neck or arm discomfort. He's had no weight gain or edema. His gym closed so is not exercising as much as he should. Of note he did fall in the office today when he went to sit down and he missed the chair. We are writing an incident report. He banged his elbow slightly but otherwise has no trauma.  Allergies  Allergen Reactions  . Codeine     Tension, nasty feeling     Current Outpatient Prescriptions  Medication Sig Dispense Refill  . amLODipine (NORVASC) 2.5 MG tablet Take 2.5 mg by mouth 2 (two) times daily.      Marland Kitchen levothyroxine (SYNTHROID, LEVOTHROID) 112 MCG tablet Take 112 mcg by mouth daily before breakfast.      . pravastatin (PRAVACHOL) 20 MG tablet Take 20 mg by mouth daily.      . fluticasone (FLONASE) 50 MCG/ACT nasal spray Place 1 spray into the nose once. As needed      . Folic Acid 20 MG CAPS Take 1 mg by mouth.      . Glucosamine HCl-MSM 1500-500 MG/30ML LIQD Take by mouth as needed.      . methotrexate (RHEUMATREX) 2.5 MG tablet Take 10 mg by mouth every 7 (seven) days. Take on Sunday      . Multiple Vitamin (MULTIVITAMIN) tablet Take 1 tablet by mouth daily.        . sodium chloride 0.9 % SOLN with inFLIXimab 100 MG SOLR Inject into the vein every 6 (six) weeks.      . tadalafil (CIALIS) 5 MG tablet Take 5 mg by mouth daily as needed.       . Tamsulosin HCl (FLOMAX) 0.4 MG CAPS Take 0.4 mg by mouth daily.       No current facility-administered medications for this visit.    Past Medical History  Diagnosis Date  . Bronchitis   . Thyroid disease   .  Psoriatic arthritis   . FH: BPH (benign prostatic hypertrophy)   . Obesity   . Hyperlipidemia   . Diverticulosis   . White coat hypertension   . Sleep apnea     CPAP    Past Surgical History  Procedure Laterality Date  . Knee surgery      bilateral arthroscopic  . Inguinal hernia repair      bilateral  . Umbilical hernia repair    . Transurethral resection of prostate     ROS:  As stated in the HPI and negative for all other systems.  PHYSICAL EXAM BP 155/90  Pulse 67  Ht 5\' 10"  (1.778 m)  Wt 183 lb (83.008 kg)  BMI 26.26 kg/m2 GENERAL:  Well appearing NECK:  No jugular venous distention, waveform within normal limits, carotid upstroke brisk and symmetric, no bruits, no thyromegaly LUNGS:  Clear to auscultation bilaterally CHEST:  Unremarkable HEART:  PMI not displaced or sustained,S1 and S2 within normal limits, no S3, no S4, no clicks, no rubs, apical systolic mid peaking radiating out the aortic outflow tract and increasing with a strain phase of Valsalva systolic murmur, no diastolic murmurs ABD:  Flat, positive bowel sounds normal in frequency in pitch, positive midline bruits, no rebound, no guarding, no midline pulsatile mass, no hepatomegaly, no splenomegaly, well-healed surgical scar EXT:  2 plus pulses throughout, no edema, no cyanosis no clubbing  EKG:  Sinus rhythm, rate 67, left axis deviation, left into the hypertrophy, probable repolarization changes, early transition in lead V2. 09/10/2014  ASSESSMENT AND PLAN  Left ventricular hypertrophy The patient does have a murmur and some evidence of HCM physiology.  However, he has no symptoms related to this.  No change in therapy is indicated.  I will likely order an echo when I see him next year.   Abdominal Bruit He did have a slightly dilated abdominal aorta. However, we can it here to followup with a repeat ultrasound.  Overweight He has lost about 50 pounds overall and seems to be maintaining good weight  loss.  Hypertension  He has been placed on Norvasc and the dose titrated but he doesn't really feel well with this. Therefore, I will try a low dose of beta blocker Toprol-XL 50 mg to be taken at night. He will let me now how he feels with this.  Headache I told this could be related to sleep apnea. He no longer uses CPAP since he lost weight. I have encouraged him to follow up with a sleep MD possibly for repeat testing.

## 2014-10-08 ENCOUNTER — Telehealth: Payer: Self-pay | Admitting: Cardiology

## 2014-10-08 NOTE — Telephone Encounter (Signed)
Pt states since starting Toprol XL 50mg  (he takes it at night) he has dizziness at night when he gets up to go to the bathroom. He does not check his BP when he feels this way. He states if he takes 1/2 of a Toprol XL 50mg  it does not work. His BP is 150s/ the next morning when he checks it. He has a friend that takes Benicar instead of Toprol XL and this works better for them.   Pt states he has significant joint pain since starting pravachol a couple of months ago.  He does not think he can continue to take pravachol because of the joint pain.   Pt aware that I am forwarding to Dr [name] for review and recommendations.

## 2014-10-08 NOTE — Telephone Encounter (Signed)
Mr.Buskey is stating that the Metoprolol is causing him to be dizzy and the cholesterol medication is making his joints to hurt pretty badly . Would like to speak to someone about it . Please call   Thanks

## 2014-10-09 NOTE — Telephone Encounter (Signed)
He can hold the statin but will need to schedule a follow up with me to discuss the next steps with antihypertensives as he has been sensitive to multiple medications.

## 2014-10-15 NOTE — Telephone Encounter (Signed)
I spoke with patient and gave him Dr Rosezella [place] recommendations.  He voiced understanding.  He has already stopped the pravastatin.  I advised that he needs an appt to discuss the antihypertensive medications.  I offered him an appt tomorrow but he declined.  I made him an appt 12/10 with Dr [name].  I offered him a sooner appt with our pharmacist, but he declined.  He said that he was managing on the Toprol.  I encouraged patient to call back with any change in symptoms.

## 2014-11-16 ENCOUNTER — Ambulatory Visit (INDEPENDENT_AMBULATORY_CARE_PROVIDER_SITE_OTHER): Payer: Medicare Other | Admitting: Cardiology

## 2014-11-16 ENCOUNTER — Encounter: Payer: Self-pay | Admitting: Cardiology

## 2014-11-16 VITALS — BP 140/72 | HR 46 | Ht 70.0 in | Wt 195.7 lb

## 2014-11-16 DIAGNOSIS — I714 Abdominal aortic aneurysm, without rupture, unspecified: Secondary | ICD-10-CM

## 2014-11-16 DIAGNOSIS — I517 Cardiomegaly: Secondary | ICD-10-CM

## 2014-11-16 DIAGNOSIS — R5382 Chronic fatigue, unspecified: Secondary | ICD-10-CM

## 2014-11-16 NOTE — Patient Instructions (Addendum)
We are ordering an echo in September -October For hypertropic cardiomopathy  And we are ordering an abd ultrasound  For AAA  Your physician recommends that you schedule a follow-up appointment in: after both test are done

## 2014-11-16 NOTE — Progress Notes (Signed)
HPI The patient returns for follow up of history of hypertensive heart disease. He has a LVH with septal hypertrophy. He did not tolerate Norvasc.  He is on a low dose beta blocker although he is slightly tired with this.  He's not describing orthostasis. He's not had increased near-syncope. He denies any palpitations. He's had no new shortness of breath, PND or orthopnea. He has no chest pressure, neck or arm discomfort.    Allergies  Allergen Reactions  . Codeine     Tension, nasty feeling     Current Outpatient Prescriptions  Medication Sig Dispense Refill  . fluticasone (FLONASE) 50 MCG/ACT nasal spray Place 1 spray into the nose once. As needed    . Folic Acid 20 MG CAPS Take 1 mg by mouth.    . Glucosamine HCl-MSM 1500-500 MG/30ML LIQD Take by mouth as needed.    Marland Kitchen levothyroxine (SYNTHROID, LEVOTHROID) 112 MCG tablet Take 112 mcg by mouth daily before breakfast.    . methotrexate (RHEUMATREX) 2.5 MG tablet Take 10 mg by mouth every 7 (seven) days. Take on Sunday    . metoprolol succinate (TOPROL-XL) 50 MG 24 hr tablet Take 1 tablet (50 mg total) by mouth daily. Take with or immediately following a meal. 30 tablet 6  . Multiple Vitamin (MULTIVITAMIN) tablet Take 1 tablet by mouth daily.      . sodium chloride 0.9 % SOLN with inFLIXimab 100 MG SOLR Inject into the vein every 6 (six) weeks.    . tadalafil (CIALIS) 5 MG tablet Take 5 mg by mouth daily as needed.      No current facility-administered medications for this visit.    Past Medical History  Diagnosis Date  . Bronchitis   . Thyroid disease   . Psoriatic arthritis   . FH: BPH (benign prostatic hypertrophy)   . Obesity   . Hyperlipidemia   . Diverticulosis   . HTN (hypertension)   . Sleep apnea     Controlled with weight loss    Past Surgical History  Procedure Laterality Date  . Knee surgery      bilateral arthroscopic  . Inguinal hernia repair      bilateral  . Umbilical hernia repair    . Transurethral  resection of prostate     ROS:  As stated in the HPI and negative for all other systems.  PHYSICAL EXAM BP 140/72 mmHg  Pulse 46  Ht 5\' 10"  (1.778 m)  Wt 195 lb 11.2 oz (88.769 kg)  BMI 28.08 kg/m2 GENERAL:  Well appearing NECK:  No jugular venous distention, waveform within normal limits, carotid upstroke brisk and symmetric, no bruits, no thyromegaly LUNGS:  Clear to auscultation bilaterally CHEST:  Unremarkable HEART:  PMI not displaced or sustained,S1 and S2 within normal limits, no S3, no S4, no clicks, no rubs, apical systolic mid peaking radiating out the aortic outflow tract and increasing with a strain phase of Valsalva systolic murmur, no diastolic murmurs ABD:  Flat, positive bowel sounds normal in frequency in pitch, positive midline bruits, no rebound, no guarding, no midline pulsatile mass, no hepatomegaly, no splenomegaly, well-healed surgical scar EXT:  2 plus pulses throughout, no edema, no cyanosis no clubbing  EKG:  Sinus rhythm, rate 67, left axis deviation, left into the hypertrophy, probable repolarization changes, early transition in lead V2. 11/16/2014  ASSESSMENT AND PLAN  Left ventricular hypertrophy The patient does have a murmur and some evidence of HCM physiology.  However, he has no symptoms related  to this.  No change in therapy is indicated.  I will order an echo when I see him in the fall of next year   Abdominal Bruit He did have a slightly dilated abdominal aorta. I will look at this in the fall of next year.  Overweight He has lost about 50 pounds but gained a few pounds back.  He is encouraged to maintain weight loss.  Hypertension  The blood pressure is at target. No change in medications is indicated. We will continue with therapeutic lifestyle changes (TLC).  Headache This is improved.  No follow up is planned.  This could be related to sleep apnea but has not wanted to use CPAP.

## 2015-04-05 ENCOUNTER — Other Ambulatory Visit: Payer: Self-pay | Admitting: *Deleted

## 2015-04-05 MED ORDER — METOPROLOL SUCCINATE ER 50 MG PO TB24: 50.0000 mg | ORAL_TABLET | Freq: Every day | ORAL | Status: DC

## 2015-04-10 ENCOUNTER — Other Ambulatory Visit: Payer: Self-pay

## 2015-04-10 MED ORDER — METOPROLOL SUCCINATE ER 50 MG PO TB24: 50.0000 mg | ORAL_TABLET | Freq: Every day | ORAL | Status: DC

## 2015-06-26 ENCOUNTER — Telehealth (HOSPITAL_COMMUNITY): Payer: Self-pay | Admitting: *Deleted

## 2015-09-12 ENCOUNTER — Other Ambulatory Visit: Payer: Self-pay | Admitting: Cardiology

## 2015-09-12 DIAGNOSIS — R0989 Other specified symptoms and signs involving the circulatory and respiratory systems: Secondary | ICD-10-CM

## 2015-09-17 ENCOUNTER — Encounter (HOSPITAL_COMMUNITY): Payer: Medicare Other

## 2015-09-17 ENCOUNTER — Ambulatory Visit (HOSPITAL_COMMUNITY): Payer: Medicare Other

## 2015-09-18 ENCOUNTER — Ambulatory Visit (HOSPITAL_BASED_OUTPATIENT_CLINIC_OR_DEPARTMENT_OTHER): Payer: Medicare Other

## 2015-09-18 ENCOUNTER — Other Ambulatory Visit: Payer: Self-pay | Admitting: Cardiology

## 2015-09-18 ENCOUNTER — Ambulatory Visit (HOSPITAL_COMMUNITY)
Admission: RE | Admit: 2015-09-18 | Discharge: 2015-09-18 | Disposition: A | Discharge status: Home / Self Care | Payer: Medicare Other | Source: Ambulatory Visit | Attending: Cardiovascular Disease | Admitting: Cardiovascular Disease

## 2015-09-18 ENCOUNTER — Encounter (HOSPITAL_COMMUNITY): Payer: Medicare Other

## 2015-09-18 ENCOUNTER — Other Ambulatory Visit: Payer: Self-pay

## 2015-09-18 ENCOUNTER — Ambulatory Visit (HOSPITAL_COMMUNITY): Payer: Medicare Other

## 2015-09-18 DIAGNOSIS — R0989 Other specified symptoms and signs involving the circulatory and respiratory systems: Secondary | ICD-10-CM

## 2015-09-18 DIAGNOSIS — I517 Cardiomegaly: Secondary | ICD-10-CM | POA: Diagnosis present

## 2015-09-18 DIAGNOSIS — I10 Essential (primary) hypertension: Secondary | ICD-10-CM | POA: Insufficient documentation

## 2015-09-18 DIAGNOSIS — I723 Aneurysm of iliac artery: Secondary | ICD-10-CM | POA: Diagnosis not present

## 2015-09-18 DIAGNOSIS — Z8249 Family history of ischemic heart disease and other diseases of the circulatory system: Secondary | ICD-10-CM | POA: Insufficient documentation

## 2015-09-18 DIAGNOSIS — I714 Abdominal aortic aneurysm, without rupture: Secondary | ICD-10-CM | POA: Diagnosis not present

## 2015-09-18 DIAGNOSIS — G4733 Obstructive sleep apnea (adult) (pediatric): Secondary | ICD-10-CM | POA: Diagnosis not present

## 2015-09-18 DIAGNOSIS — Z87891 Personal history of nicotine dependence: Secondary | ICD-10-CM | POA: Insufficient documentation

## 2015-09-18 DIAGNOSIS — E785 Hyperlipidemia, unspecified: Secondary | ICD-10-CM | POA: Insufficient documentation

## 2015-09-18 DIAGNOSIS — I70203 Unspecified atherosclerosis of native arteries of extremities, bilateral legs: Secondary | ICD-10-CM | POA: Diagnosis not present

## 2015-09-23 ENCOUNTER — Telehealth: Payer: Self-pay | Admitting: Cardiology

## 2015-09-23 NOTE — Telephone Encounter (Signed)
Martin Rubio is calling about his test results . Please call   Thanks

## 2015-09-23 NOTE — Telephone Encounter (Signed)
AAA duplex results given to patient - informed him echo results still waiting on provider sig.

## 2015-10-08 ENCOUNTER — Ambulatory Visit (INDEPENDENT_AMBULATORY_CARE_PROVIDER_SITE_OTHER): Payer: Medicare Other | Admitting: Cardiology

## 2015-10-08 ENCOUNTER — Encounter: Payer: Self-pay | Admitting: Cardiology

## 2015-10-08 VITALS — BP 128/84 | HR 59 | Ht 70.0 in | Wt 215.5 lb

## 2015-10-08 DIAGNOSIS — I517 Cardiomegaly: Secondary | ICD-10-CM

## 2015-10-08 MED ORDER — METOPROLOL SUCCINATE ER 50 MG PO TB24: 50.0000 mg | ORAL_TABLET | Freq: Every day | ORAL | Status: DC

## 2015-10-08 NOTE — Patient Instructions (Signed)
Your physician wants you to follow-up in: 1 Year. You will receive a reminder letter in the mail two months in advance. If you don't receive a letter, please call our office to schedule the follow-up appointment.  

## 2015-10-08 NOTE — Progress Notes (Signed)
HPI The patient returns for follow up of history of hypertensive heart disease. He has a LVH with septal hypertrophy. An echocardiogram done the other day demonstrates stable mild LVH and a normal left ventricular function without significant gradient. He does have a left common iliac aneurysm following the filling no significant abnormalities on lower extremity ultrasound. He's not exercising as much as I would like gained some weight.  . He denies any palpitations. He's had no new shortness of breath, PND or orthopnea. He has no chest pressure, neck or arm discomfort.  He does have some nighttime leg cramps.  Allergies  Allergen Reactions  . Codeine     Tension, nasty feeling     Current Outpatient Prescriptions  Medication Sig Dispense Refill  . Certolizumab Pegol (CIMZIA [place]) Inject 1 mL into the skin every 6 (six) weeks.    . Glucosamine HCl-MSM 1500-500 MG/30ML LIQD Take by mouth as needed.    Marland Kitchen levothyroxine (SYNTHROID, LEVOTHROID) 112 MCG tablet Take 112 mcg by mouth daily before breakfast.    . metoprolol succinate (TOPROL-XL) 50 MG 24 hr tablet Take 1 tablet (50 mg total) by mouth daily. Take with or immediately following a meal. 30 tablet 5  . Multiple Vitamin (MULTIVITAMIN) tablet Take 1 tablet by mouth daily.      . tadalafil (CIALIS) 5 MG tablet Take 5 mg by mouth daily as needed.      No current facility-administered medications for this visit.    Past Medical History  Diagnosis Date  . Bronchitis   . Thyroid disease   . Psoriatic arthritis ([place])   . FH: BPH (benign prostatic hypertrophy)   . Obesity   . Hyperlipidemia   . Diverticulosis   . HTN (hypertension)   . Sleep apnea     Controlled with weight loss    Past Surgical History  Procedure Laterality Date  . Knee surgery      bilateral arthroscopic  . Inguinal hernia repair      bilateral  . Umbilical hernia repair    . Transurethral resection of prostate     ROS:  HA, fatigue, nausea.  Otherwise as  stated in the HPI and negative for all other systems.  PHYSICAL EXAM BP 128/84 mmHg  Pulse 59  Ht 5\' 10"  (1.778 m)  Wt 215 lb 8 oz (97.75 kg)  BMI 30.92 kg/m2 GENERAL:  Well appearing NECK:  No jugular venous distention, waveform within normal limits, carotid upstroke brisk and symmetric, no bruits, no thyromegaly LUNGS:  Clear to auscultation bilaterally CHEST:  Unremarkable HEART:  PMI not displaced or sustained,S1 and S2 within normal limits, no S3, no S4, no clicks, no rubs, apical systolic mid peaking radiating out the aortic outflow tract and increasing with a strain phase of Valsalva systolic murmur, no diastolic murmurs ABD:  Flat, positive bowel sounds normal in frequency in pitch, positive midline bruits, no rebound, no guarding, no midline pulsatile mass, no hepatomegaly, no splenomegaly, well-healed surgical scar EXT:  2 plus pulses throughout, no edema, no cyanosis no clubbing  EKG:  Sinus rhythm, rate 59, left axis deviation, left into the hypertrophy, probable repolarization changes, early transition in lead V2. 10/08/2015  ASSESSMENT AND PLAN  Left ventricular hypertrophy This is stable.  No change in therapy is indicated.  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 He has a 50% stenosis, iliac arteries. He's not having any symptoms related to this and  he has good distal pulses. No change in therapy is indicated.  Overweight He had lost weight but gained it back.  We talked about increasing his activity.    Hypertension  The blood pressure is at target. No change in medications is indicated. We will continue with therapeutic lifestyle changes (TLC).

## 2016-01-26 NOTE — Progress Notes (Signed)
HPI The patient returns for follow up of leg pain. He has a LVH with septal hypertrophy. The most recent echocardiogram demonstrates stable mild LVH and a normal left ventricular function without significant gradient. 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  Recently he had some left thigh weakness and was told that the might be vascular.  He denies any claudication. He does have leg pain but mostly at night. He's not had any other extensive vascular disease. He has had ultrasounds as below. He's had no new chest discomfort, neck or arm discomfort. He's had no shortness of breath, PND or orthopnea.cles in the left thigh were smaller than the right.    Allergies  Allergen Reactions  . Codeine     Tension, nasty feeling   . Prednisone Swelling  . Statins     Joint pain  . Sulfa Antibiotics     Current Outpatient Prescriptions  Medication Sig Dispense Refill  . Albuterol Sulfate (PROAIR RESPICLICK) [id number] (90 Base) MCG/ACT AEPB Inhale 2 puffs into the lungs daily as needed. Inhale into the lungs. As needed    . Certolizumab Pegol (CIMZIA [place]) Inject 1 mL into the skin every 6 (six) weeks.    . Glucosamine HCl-MSM 1500-500 MG/30ML LIQD Take by mouth as needed.    Marland Kitchen levothyroxine (SYNTHROID, LEVOTHROID) 112 MCG tablet Take 112 mcg by mouth daily before breakfast.    . metoprolol succinate (TOPROL-XL) 50 MG 24 hr tablet Take 1 tablet (50 mg total) by mouth daily. Take with or immediately following a meal. 30 tablet 5  . Multiple Vitamin (MULTIVITAMIN) tablet Take 1 tablet by mouth daily.      . tadalafil (CIALIS) 5 MG tablet Take 5 mg by mouth daily as needed.      No current facility-administered medications for this visit.    Past Medical History  Diagnosis Date  . Bronchitis   . Thyroid disease   . Psoriatic arthritis ([place])   . FH: BPH (benign prostatic hypertrophy)   . Obesity   . Hyperlipidemia   . Diverticulosis   . HTN (hypertension)   . Sleep  apnea     Controlled with weight loss    Past Surgical History  Procedure Laterality Date  . Knee surgery      bilateral arthroscopic  . Inguinal hernia repair      bilateral  . Umbilical hernia repair    . Transurethral resection of prostate     ROS:  HA, fatigue, nausea.  Otherwise as stated in the HPI and negative for all other systems.  PHYSICAL EXAM BP 130/60 mmHg  Pulse 64  Ht 5\' 10"  (1.778 m)  Wt 213 lb 8 oz (96.843 kg)  BMI 30.63 kg/m2 GENERAL:  Well appearing NECK:  No jugular venous distention, waveform within normal limits, carotid upstroke brisk and symmetric, no bruits, no thyromegaly LUNGS:  Clear to auscultation bilaterally CHEST:  Unremarkable HEART:  PMI not displaced or sustained,S1 and S2 within normal limits, no S3, no S4, no clicks, no rubs, apical systolic mid peaking radiating out the aortic outflow tract and increasing with a strain phase of Valsalva systolic murmur, no diastolic murmurs ABD:  Flat, positive bowel sounds normal in frequency in pitch, positive midline bruits, no rebound, no guarding, no midline pulsatile mass, no hepatomegaly, no splenomegaly, well-healed surgical scar EXT:  2 plus pulses throughout, no edema, no cyanosis no clubbing   ASSESSMENT AND PLAN  Leg pain I see  no evidence that this is related to the aneurysm below.  He has excellent pulses.  I would suggest that this could be neurologic and I have suggested that he be evaluated by a back specialist.  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 He has a 50% stenosis, iliac arteries. He's not having any symptoms related to this and he has good distal pulses. No change in therapy is indicated.  Hypertension  The blood pressure is at target. No change in medications is indicated. We will continue with therapeutic lifestyle changes (TLC).

## 2016-01-27 ENCOUNTER — Encounter: Payer: Self-pay | Admitting: Cardiology

## 2016-01-27 ENCOUNTER — Ambulatory Visit (INDEPENDENT_AMBULATORY_CARE_PROVIDER_SITE_OTHER): Payer: Medicare Other | Admitting: Cardiology

## 2016-01-27 VITALS — BP 130/60 | HR 64 | Ht 70.0 in | Wt 213.5 lb

## 2016-01-27 DIAGNOSIS — M79605 Pain in left leg: Secondary | ICD-10-CM | POA: Diagnosis not present

## 2016-01-27 NOTE — Patient Instructions (Signed)
Your physician wants you to follow-up in: ONE YEAR WITH DR HOCHREIN You will receive a reminder letter in the mail two months in advance. If you don't receive a letter, please call our office to schedule the follow-up appointment.   If you need a refill on your cardiac medications before your next appointment, please call your pharmacy.  

## 2016-02-26 ENCOUNTER — Ambulatory Visit: Payer: Medicare Other | Attending: Physician Assistant | Admitting: Physical Therapy

## 2016-02-26 DIAGNOSIS — R29898 Other symptoms and signs involving the musculoskeletal system: Secondary | ICD-10-CM | POA: Diagnosis present

## 2016-02-26 DIAGNOSIS — M5442 Lumbago with sciatica, left side: Secondary | ICD-10-CM | POA: Insufficient documentation

## 2016-02-26 NOTE — Therapy (Addendum)
[facility] [address]  [place], [place], [address] Phone: [phone]   Fax:  [phone]  Physical Therapy Evaluation  Patient Details  Name: Martin Rubio MRN: [id number] Date of Birth: [birth year] Referring Provider: Melida Gimenez, PA-C for Dr. Rolena Infante  Encounter Date: 02/26/2016      PT End of Session - 02/26/16 0922    Visit Number 1   Number of Visits 12   Date for PT Re-Evaluation 04/08/16   PT Start Time 0910  Issues with locating PT order - Pt forgot to bring order sheet and MD office unable to locate order   PT Stop Time 0938   PT Time Calculation (min) 28 min   Activity Tolerance Patient tolerated treatment well   Behavior During Therapy [facility] for tasks assessed/performed      Past Medical History  Diagnosis Date  . Bronchitis   . Thyroid disease   . Psoriatic arthritis ([place])   . FH: BPH (benign prostatic hypertrophy)   . Obesity   . Hyperlipidemia   . Diverticulosis   . HTN (hypertension)   . Sleep apnea     Controlled with weight loss    Past Surgical History  Procedure Laterality Date  . Knee surgery      bilateral arthroscopic  . Inguinal hernia repair      bilateral  . Umbilical hernia repair    . Transurethral resection of prostate      There were no vitals filed for this visit.  Visit Diagnosis:  Left-sided low back pain with left-sided sciatica  Weakness of both legs      Subjective Assessment - 02/26/16 0912    Subjective Pt reports pain began ~3 months ago when he started doing sit-ups at the gym in an effort to "get rid of my gut" when he noted onset of low back pain with radicular pain down his L leg. Saw MD and received L5-S1 ESI on 02/12/16 with relief of radicular pain but pain still present in low back.   Pertinent History B knee arthroscopy, B knee rooster comb injections x 2   Limitations Sitting;Standing   How long can you sit comfortably? <1 hr while driving    How long can you stand comfortably? < 20 minutes   Diagnostic tests 02/10/16 MRI Lumbar Spine: Multilevel DDD and facet arthrosis, L2-3 mild L foraminal stenosis, L3-4 moderate-size R posteriolateral disc protrusion slightly displaces the R L4 nerve root with mild R foraminal stenosis.   Patient Stated Goals "No pain"   Currently in Pain? Yes   Pain Score 0-No pain  Least 0/10, Avg 2-3/10, Worst 4-5/10   Pain Location Back   Pain Orientation Mid;Lower   Pain Descriptors / Indicators Nagging   Pain Type Acute pain   Pain Radiating Towards Radicular pain & numbness down L leg to knee initially but resolved since ESI   Pain Onset More than a month ago   Pain Frequency Intermittent   Aggravating Factors  Bending, Driving, Static standing   Pain Relieving Factors Ibuprofen, Tylenol, Sitting, Walking/changing position   Effect of Pain on Daily Activities Avoids lifting due to fear of hurting self, Unable to stand for longer periods            [facility] PT Assessment - 02/26/16 0910    Assessment   Medical Diagnosis Chronic L low back pain with L-sided sciatica   Referring Provider Melida Gimenez, PA-C for Dr. Rolena Infante  Onset Date/Surgical Date --  7 months ago per MD notes   Next MD Visit 03/30/16 with Dr. Rolena Infante   Prior Therapy none   Prior Function   Level of [place] Retired   Leisure [employer], Travel   Observation/Other Assessments   Focus on Therapeutic Outcomes (FOTO)  Lumbar Spine - 59% (41% limitation); Predicted 65% (35% limitation)   Posture/Postural Control   Posture/Postural Control No significant limitations   ROM / Strength   AROM / PROM / Strength AROM;Strength   AROM   AROM Assessment Site Lumbar   Lumbar Flexion 75%   Lumbar Extension 50%   Lumbar - Right Side Bend WFL   Lumbar - Left Side Bend WFL   Lumbar - Right Rotation WFL   Lumbar - Left Rotation [facility]   Strength   Strength Assessment Site Hip;Knee   Right/Left Hip Right;Left    Right Hip Flexion 3+/5   Right Hip Extension 3+/5   Right Hip ABduction 4-/5   Right Hip ADduction 4-/5   Left Hip Flexion 3+/5   Left Hip Extension 3/5   Left Hip ABduction 3+/5   Left Hip ADduction 4-/5   Right/Left Knee Right;Left   Right Knee Flexion 4-/5   Right Knee Extension 4-/5   Left Knee Flexion 4-/5   Left Knee Extension 4-/5   Flexibility   Soft Tissue Assessment /Muscle Length yes   Hamstrings moderate tightness bilaterally   Quadriceps moderate tightness bilaterally   ITB WFL   Piriformis moderate tightness bilaterally   Palpation   Palpation comment no ttp with mild tightness in lumbar paraspinals   Special Tests    Special Tests Lumbar;Hip Special Tests   Lumbar Tests FABER test;Prone Knee Bend Test;Straight Leg Raise   Hip Special Tests  Thomas Test;Ober's Test   FABER test   findings Negative   Comment Negative for change in LBP, but some discomfort in hips and moderate tightness noted   Prone Knee Bend Test   Findings Positive   Side Left   Straight Leg Raise   Findings Negative   Thomas Test    Findings Positive  mildly   Side Right;Left   Ober's Test   Findings Negative              PT Short Term Goals - 02/26/16 1320    PT SHORT TERM GOAL #1   Title Pt will be independent with initial HEP by 03/11/16   Status New           PT Long Term Goals - 02/26/16 1321    PT LONG TERM GOAL #1   Title Pt will be independent with advanced HEP/gym program by 04/08/16   Status New   PT LONG TERM GOAL #2   Title Pt will demonstrate appropraite body mechanics for lifting to avoid further injury to low back by 04/08/16   Status New   PT LONG TERM GOAL #3   Title Pt will be able to perform normal daily tasks without limitation due to low back pain by 04/08/16   Status New   PT LONG TERM GOAL #4   Title Pt will be able to drive >1 hr without limitation due to low back pain by 04/08/16   Status New               Plan - 02/26/16 1258    Clinical  Impression Statement Pt is a 70 y/o male who presents to OP PT with  low back pain with L-sided sciatica of ~7 months duration. Pt reports low back pain originated when he started to add sit-ups to his workout routine at the gym in an effort to "get rid of my gut" and progressed to include radicular pain and numbness down L LE to knee. Radicular pain appears to have resolved following recent lumbar ESI but low back pain persists averaging 2-3/10 and up to 4-5/10 at worst. Only mild limitations noted in lumbar ROM but moderate tightness noted in proximal LE musculature. LE MMT reveals weakness in bilateral hips and knees of 3/5 to 4-/5, with weakness more pronounced proximally on the left. Pain is exacerbated by bending, static standing and driving for prolonged periods and allieviated by changing position or walking around. Pt has avoided lifting for fear of hurting his back further.   Pt will benefit from skilled therapeutic intervention in order to improve on the following deficits Pain;Impaired flexibility;Decreased range of motion;Improper body mechanics;Decreased strength;Decreased activity tolerance   Clinical Impairments Affecting Rehab Potential B knee OA s/p arthroscpic surgeries and rooster comb injections; Psoriatic arthritis; Obesity; Aneurysm of the proximal left common iliac artery and 50% stenosis in iliac arteries   PT Frequency 2x / week   PT Duration 6 weeks   PT Treatment/Interventions Patient/family education;Manual techniques;Taping;Dry needling;Neuromuscular re-education;Therapeutic exercise;Therapeutic activities;Ultrasound;Moist Heat;Electrical Stimulation;Cryotherapy;Iontophoresis 4mg /ml Dexamethasone;Traction   PT Next Visit Plan Create initial HEP for LE flexibility and lumbar stabilization; Manual therapy and modalities PRN for pain   Consulted and Agree with Plan of Care Patient         Problem List Patient Active Problem List   Diagnosis Date Noted  . Fatigue 12/25/2012   . LVH (left ventricular hypertrophy) 12/25/2012    [name], PT, MPT 2016-03-20, 1:27 PM  [facility] [address]  [place], [place], [address] Phone: [phone]   Fax:  [phone]  Name: Martin Rubio MRN: [id number] Date of Birth: 1946-02-22   G-Codes - 20-Mar-2016 1105   Functional Assessment Tool Used Lumbar Spine FOTO= 59% (41% limitation)   Functional Limitation Changing and maintaining body position     Changing and Maintaining Body Position Goal Status [phone]) At least 40 percent but less than 60 percent impaired, limited or restricted     Changing and Maintaining Body Position Goal Status [phone]) At least 20 percent but less than 40 percent impaired, limited or restricted         [name], PT, MPT 03/18/2016, 1:56 PM  [facility] [address]  [place], [place], [address] Phone: [phone]   Fax:  [phone]

## 2016-02-28 ENCOUNTER — Ambulatory Visit: Payer: Medicare Other | Admitting: Physical Therapy

## 2016-02-28 DIAGNOSIS — M5442 Lumbago with sciatica, left side: Secondary | ICD-10-CM | POA: Diagnosis not present

## 2016-02-28 DIAGNOSIS — R29898 Other symptoms and signs involving the musculoskeletal system: Secondary | ICD-10-CM

## 2016-02-28 NOTE — Therapy (Signed)
[facility] [address]  [place], [place], [address] Phone: [phone]   Fax:  [phone]  Physical Therapy Treatment  Patient Details  Name: Martin Rubio MRN: [id number] Date of Birth: [birth year] Referring Provider: Melida Gimenez, PA-C for Dr. Rolena Infante  Encounter Date: 02/28/2016      PT End of Session - 02/28/16 0809    Visit Number 2   Number of Visits 12   Date for PT Re-Evaluation 04/08/16   PT Start Time 0803   PT Stop Time [id number]   PT Time Calculation (min) 44 min   Activity Tolerance Patient tolerated treatment well   Behavior During Therapy [facility] for tasks assessed/performed      Past Medical History  Diagnosis Date  . Bronchitis   . Thyroid disease   . Psoriatic arthritis ([place])   . FH: BPH (benign prostatic hypertrophy)   . Obesity   . Hyperlipidemia   . Diverticulosis   . HTN (hypertension)   . Sleep apnea     Controlled with weight loss    Past Surgical History  Procedure Laterality Date  . Knee surgery      bilateral arthroscopic  . Inguinal hernia repair      bilateral  . Umbilical hernia repair    . Transurethral resection of prostate      There were no vitals filed for this visit.  Visit Diagnosis:  Left-sided low back pain with left-sided sciatica  Weakness of both legs      Subjective Assessment - 02/28/16 0806    Subjective Pt reporting pain in his knees this morning, R > L.   Currently in Pain? Yes   Pain Score 0-No pain   Pain Location Back   Multiple Pain Sites Yes   Pain Score 3  was 8/10 when he first got up this morning   Pain Location Knee   Pain Orientation Right;Anterior   Pain Type Acute pain        Today's Treatment  TherEx (all stretches demonstrated manually for 1st rep, then pt instructed how to complete stretch on own for last 2 reps)    B Hamstring stretch with strap 3x20"    B SKTC 3x20"    B KTOS Piriformis stretch 3x20"    B Thomas  stretch 3x20" LTR 10x5" Abdominal bracing 10x5" TrA + Hooklying alternating Hip ABD/ER 10x3" TrA + Hooklying march 10x3"         PT Education - 02/28/16 0851    Education provided Yes   Education Details Initial HEP    Person(s) Educated Patient   Methods Explanation;Demonstration;Tactile cues;Verbal cues;Handout   Comprehension Verbalized understanding;Returned demonstration;Verbal cues required;Tactile cues required;Need further instruction          PT Short Term Goals - 02/28/16 0903    PT SHORT TERM GOAL #1   Title Pt will be independent with initial HEP by 03/11/16   Status On-going           PT Long Term Goals - 02/28/16 0904    PT LONG TERM GOAL #1   Title Pt will be independent with advanced HEP/gym program by 04/08/16   Status On-going   PT LONG TERM GOAL #2   Title Pt will demonstrate appropraite body mechanics for lifting to avoid further injury to low back by 04/08/16   Status On-going   PT LONG TERM GOAL #3   Title Pt will be able to perform normal daily tasks without  limitation due to low back pain by 04/08/16   Status On-going   PT LONG TERM GOAL #4   Title Pt will be able to drive >1 hr without limitation due to low back pain by 04/08/16   Status On-going               Plan - 02/28/16 0853    Clinical Impression Statement Provided instruction in initial HEP with modification made to some stretches to allow for best stretch with least discomfort. Pt cautioned to take stretches only to point where gentle stretch felt and not force into painful movement. Introduced abdominal stabilization exercises with no resistance added to promote increased focus on abdominal activation.   PT Next Visit Plan Review initial HEP; Progress LE flexibility and lumbar stabilization as able; Manual therapy and modalities PRN for pain   Consulted and Agree with Plan of Care Patient        Problem List Patient Active Problem List   Diagnosis Date Noted  . Fatigue  12/25/2012  . LVH (left ventricular hypertrophy) 12/25/2012    [name], PT, MPT 02/28/2016, 9:05 AM  [facility] [address]  [place], [place], [address] Phone: [phone]   Fax:  [phone]  Name: Martin Rubio MRN: [id number] Date of Birth: 1946/01/28

## 2016-03-03 ENCOUNTER — Ambulatory Visit: Payer: Medicare Other

## 2016-03-03 DIAGNOSIS — M5442 Lumbago with sciatica, left side: Secondary | ICD-10-CM

## 2016-03-03 DIAGNOSIS — R29898 Other symptoms and signs involving the musculoskeletal system: Secondary | ICD-10-CM

## 2016-03-03 NOTE — Therapy (Signed)
[facility] [address]  [place], [place], [address] Phone: [phone]   Fax:  [phone]  Physical Therapy Treatment  Patient Details  Name: Martin Rubio MRN: [id number] Date of Birth: 04-Feb-1946 Referring Provider: Melida Gimenez, PA-C for Dr. Rolena Infante  Encounter Date: 03/03/2016      PT End of Session - 03/03/16 1003    Visit Number 3   Number of Visits 12   Date for PT Re-Evaluation 04/08/16   PT Start Time 0938   PT Stop Time 1019   PT Time Calculation (min) 41 min   Activity Tolerance Patient tolerated treatment well   Behavior During Therapy [facility] for tasks assessed/performed      Past Medical History  Diagnosis Date  . Bronchitis   . Thyroid disease   . Psoriatic arthritis ([place])   . FH: BPH (benign prostatic hypertrophy)   . Obesity   . Hyperlipidemia   . Diverticulosis   . HTN (hypertension)   . Sleep apnea     Controlled with weight loss    Past Surgical History  Procedure Laterality Date  . Knee surgery      bilateral arthroscopic  . Inguinal hernia repair      bilateral  . Umbilical hernia repair    . Transurethral resection of prostate      There were no vitals filed for this visit.  Visit Diagnosis:  Weakness of both legs  Left-sided low back pain with left-sided sciatica      Subjective Assessment - 03/03/16 0959    Subjective Pt. reports 0/10 LBP currently.  Pt. reports rotating at the LB caused pain this morning while getting out of bed.     Patient Stated Goals "No pain"   Currently in Pain? No/denies   Pain Score 0-No pain   Multiple Pain Sites No       Today's treatment:  TherEx:     B Hamstring stretch with strap 3x20"     B SKTC 3x20"     B KTOS Piriformis stretch 3x20"     B Thomas stretch 3x20" LTR 2 x 20" Abdominal bracing 10x5" TrA + Hooklying alternating Hip ABD/ER 10x3" TrA + Hooklying march 2 x 10x3" Bridging with ER / abd with blue TB  combo 2 x 20 sec  Bridging 2 x 10 reps          PT Short Term Goals - 02/28/16 0903    PT SHORT TERM GOAL #1   Title Pt will be independent with initial HEP by 03/11/16   Status On-going           PT Long Term Goals - 02/28/16 0904    PT LONG TERM GOAL #1   Title Pt will be independent with advanced HEP/gym program by 04/08/16   Status On-going   PT LONG TERM GOAL #2   Title Pt will demonstrate appropraite body mechanics for lifting to avoid further injury to low back by 04/08/16   Status On-going   PT LONG TERM GOAL #3   Title Pt will be able to perform normal daily tasks without limitation due to low back pain by 04/08/16   Status On-going   PT LONG TERM GOAL #4   Title Pt will be able to drive >1 hr without limitation due to low back pain by 04/08/16   Status On-going               Plan - 03/03/16  1004    Clinical Impression Statement Pt. tolerated all lumbopelvic strengthening and stretching well today with no LBP increase however difficulty relaxing with strretching.  flexion bias approach taken with all activities.  Current HEP reviewed with pt. today; pt. demo'd basic understanding however may benefit from further instruction.     PT Next Visit Plan Progress LE flexibility and lumbar stabilization as able; Manual therapy and modalities PRN for pain   Consulted and Agree with Plan of Care Patient        Problem List Patient Active Problem List   Diagnosis Date Noted  . Fatigue 12/25/2012  . LVH (left ventricular hypertrophy) 12/25/2012    Bess Harvest, PTA 03/03/2016, 12:10 PM  [facility] [address]  [place], [place], [address] Phone: [phone]   Fax:  [phone]  Name: Martin Rubio MRN: [id number] Date of Birth: 1946-03-25

## 2016-03-04 ENCOUNTER — Ambulatory Visit: Payer: Medicare Other | Admitting: Physical Therapy

## 2016-03-06 ENCOUNTER — Ambulatory Visit: Payer: Medicare Other

## 2016-03-06 DIAGNOSIS — M5442 Lumbago with sciatica, left side: Secondary | ICD-10-CM | POA: Diagnosis not present

## 2016-03-06 DIAGNOSIS — R29898 Other symptoms and signs involving the musculoskeletal system: Secondary | ICD-10-CM

## 2016-03-06 NOTE — Therapy (Signed)
[facility] [address]  [place], [place], [address] Phone: [phone]   Fax:  [phone]  Physical Therapy Treatment  Patient Details  Name: Martin Rubio MRN: [id number] Date of Birth: October 17, 1946 Referring Provider: Melida Gimenez, PA-C for Dr. Rolena Infante  Encounter Date: 03/06/2016      PT End of Session - 03/06/16 0853    Visit Number 4   Number of Visits 12   Date for PT Re-Evaluation 04/08/16   PT Start Time 0851   PT Stop Time 0932   PT Time Calculation (min) 41 min   Activity Tolerance Patient tolerated treatment well   Behavior During Therapy [facility] for tasks assessed/performed      Past Medical History  Diagnosis Date  . Bronchitis   . Thyroid disease   . Psoriatic arthritis ([place])   . FH: BPH (benign prostatic hypertrophy)   . Obesity   . Hyperlipidemia   . Diverticulosis   . HTN (hypertension)   . Sleep apnea     Controlled with weight loss    Past Surgical History  Procedure Laterality Date  . Knee surgery      bilateral arthroscopic  . Inguinal hernia repair      bilateral  . Umbilical hernia repair    . Transurethral resection of prostate      There were no vitals filed for this visit.  Visit Diagnosis:  Weakness of both legs  Left-sided low back pain with left-sided sciatica      Subjective Assessment - 03/06/16 0857    Subjective Pt. reports 0/10 LBP currently.     Patient Stated Goals "No pain"   Currently in Pain? No/denies   Multiple Pain Sites No       Today's Treatment  TherEx NuStep:  B HS, SKTC, piri, RF  3 x 20"  LTR 10x5" Abdominal bracing 10x5" TrA + Hooklying alternating Hip ABD/ER 10x3" TrA + Hooklying march 10x3" HS curl with heels on peanut p-ball x 10 reps  Bridging with heels on peanut p-ball x 10 reps Hooklying B abd/ER with blue TB x 10 reps Bridging with B hip abd/ER with blue TB around knees x 10 reps  Bridging with unilateral isometric / ER  with blue TB 3 x 5 reps             PT Short Term Goals - 03/06/16 0902    PT SHORT TERM GOAL #1   Title Pt will be independent with initial HEP by 03/11/16  03/06/16: pt. verbalized / demo'd independence with initial HEP.     Status Achieved           PT Long Term Goals - 02/28/16 0904    PT LONG TERM GOAL #1   Title Pt will be independent with advanced HEP/gym program by 04/08/16   Status On-going   PT LONG TERM GOAL #2   Title Pt will demonstrate appropraite body mechanics for lifting to avoid further injury to low back by 04/08/16   Status On-going   PT LONG TERM GOAL #3   Title Pt will be able to perform normal daily tasks without limitation due to low back pain by 04/08/16   Status On-going   PT LONG TERM GOAL #4   Title Pt will be able to drive >1 hr without limitation due to low back pain by 04/08/16   Status On-going  Plan - 03/06/16 0855    Clinical Impression Statement Pt. tolerated advancement of difficulty with lumbopelvic strengthening activity well today with only occasional L LB / hip pain which resolved itself quickly.  Pt. reports adherence to current HEP.     PT Next Visit Plan Progress LE flexibility and lumbar stabilization as able; Manual therapy and modalities PRN for pain        Problem List Patient Active Problem List   Diagnosis Date Noted  . Fatigue 12/25/2012  . LVH (left ventricular hypertrophy) 12/25/2012    Bess Harvest, PTA 03/06/2016, 12:24 PM  [facility] [address]  [place], [place], [address] Phone: [phone]   Fax:  [phone]  Name: Martin Rubio MRN: [id number] Date of Birth: 30-Jul-1946

## 2016-03-10 ENCOUNTER — Ambulatory Visit: Payer: Medicare Other | Attending: Physician Assistant

## 2016-03-10 DIAGNOSIS — M6281 Muscle weakness (generalized): Secondary | ICD-10-CM | POA: Insufficient documentation

## 2016-03-10 DIAGNOSIS — R29898 Other symptoms and signs involving the musculoskeletal system: Secondary | ICD-10-CM | POA: Insufficient documentation

## 2016-03-10 DIAGNOSIS — M5442 Lumbago with sciatica, left side: Secondary | ICD-10-CM | POA: Insufficient documentation

## 2016-03-10 NOTE — Therapy (Signed)
[facility] [address]  [place], [place], [address] Phone: [phone]   Fax:  [phone]  Physical Therapy Treatment  Patient Details  Name: Martin Rubio MRN: [id number] Date of Birth: 1945/12/29 Referring Provider: Melida Gimenez, PA-C for Dr. Rolena Infante  Encounter Date: 03/10/2016      PT End of Session - 03/10/16 1031    Visit Number 5   Number of Visits 12   Date for PT Re-Evaluation 04/08/16   PT Start Time [id number]   PT Stop Time 1102   PT Time Calculation (min) 39 min   Activity Tolerance Patient tolerated treatment well   Behavior During Therapy [facility] for tasks assessed/performed      Past Medical History  Diagnosis Date  . Bronchitis   . Thyroid disease   . Psoriatic arthritis ([place])   . FH: BPH (benign prostatic hypertrophy)   . Obesity   . Hyperlipidemia   . Diverticulosis   . HTN (hypertension)   . Sleep apnea     Controlled with weight loss    Past Surgical History  Procedure Laterality Date  . Knee surgery      bilateral arthroscopic  . Inguinal hernia repair      bilateral  . Umbilical hernia repair    . Transurethral resection of prostate      There were no vitals filed for this visit.  Visit Diagnosis:  Left-sided low back pain with left-sided sciatica  Weakness of both legs      Subjective Assessment - 03/10/16 1030    Subjective Pt. reports 0/10 LBP currently.     Patient Stated Goals "No pain"   Currently in Pain? No/denies   Pain Score 0-No pain   Multiple Pain Sites No        Today's Treatment  TherEx Recurrent 4 min, level 2  B HS, SKTC, piri, RF 3 x 20"  Bridging 10 reps   HS curl with bridge combo with heels on peanut p-ball x 10 reps Bridge with heels on peanut p-ball x 10 reps LTR 10" x 2 each way  TrA + Hooklying march 10x3" Hooklying B abd/ER with black TB x 10 reps Bridging with B hip abd/ER with black TB around knees x 10 reps  Bridging with  unilateral isometric / ER with black TB 3 x 5 reps Fitter hip extension x 5 each side (1 black / 1 blue) Leg curl machine x 15 reps @ 25#          PT Short Term Goals - 03/06/16 0902    PT SHORT TERM GOAL #1   Title Pt will be independent with initial HEP by 03/11/16  03/06/16: pt. verbalized / demo'd independence with initial HEP.     Status Achieved           PT Long Term Goals - 02/28/16 0904    PT LONG TERM GOAL #1   Title Pt will be independent with advanced HEP/gym program by 04/08/16   Status On-going   PT LONG TERM GOAL #2   Title Pt will demonstrate appropraite body mechanics for lifting to avoid further injury to low back by 04/08/16   Status On-going   PT LONG TERM GOAL #3   Title Pt will be able to perform normal daily tasks without limitation due to low back pain by 04/08/16   Status On-going   PT LONG TERM GOAL #4   Title Pt will be able to  drive >1 hr without limitation due to low back pain by 04/08/16   Status On-going               Plan - 03/10/16 1031    Clinical Impression Statement Pt. tolerated advancement of lumbopelvic strengthening activity well today with SL standing hip extension on fitter added today.  Pt. with initial LBP 0/10 today, which remained unchanged througout therex.  Lumbopelvic strengthening activity mostly with flexion bias approach.     PT Next Visit Plan Progress LE flexibility and lumbar stabilization as able; Manual therapy and modalities PRN for pain        Problem List Patient Active Problem List   Diagnosis Date Noted  . Fatigue 12/25/2012  . LVH (left ventricular hypertrophy) 12/25/2012    Bess Harvest, PTA 03/10/2016, 12:08 PM  [facility] [address]  [place], [place], [address] Phone: [phone]   Fax:  [phone]  Name: Martin Rubio MRN: [id number] Date of Birth: August 24, 1946

## 2016-03-12 ENCOUNTER — Ambulatory Visit: Payer: Medicare Other

## 2016-03-12 DIAGNOSIS — M5442 Lumbago with sciatica, left side: Secondary | ICD-10-CM | POA: Diagnosis not present

## 2016-03-12 DIAGNOSIS — R29898 Other symptoms and signs involving the musculoskeletal system: Secondary | ICD-10-CM

## 2016-03-12 NOTE — Therapy (Signed)
[facility] [address]  [place], [place], [address] Phone: [phone]   Fax:  [phone]  Physical Therapy Treatment  Patient Details  Name: Martin Rubio MRN: [id number] Date of Birth: October 13, 1946 Referring Provider: Melida Gimenez, PA-C for Dr. Rolena Infante  Encounter Date: 03/12/2016      PT End of Session - 03/12/16 1510    Visit Number 6   Number of Visits 12   Date for PT Re-Evaluation 04/08/16   PT Start Time 1319   PT Stop Time 1400   PT Time Calculation (min) 41 min   Activity Tolerance Patient tolerated treatment well   Behavior During Therapy [facility] for tasks assessed/performed      Past Medical History  Diagnosis Date  . Bronchitis   . Thyroid disease   . Psoriatic arthritis ([place])   . FH: BPH (benign prostatic hypertrophy)   . Obesity   . Hyperlipidemia   . Diverticulosis   . HTN (hypertension)   . Sleep apnea     Controlled with weight loss    Past Surgical History  Procedure Laterality Date  . Knee surgery      bilateral arthroscopic  . Inguinal hernia repair      bilateral  . Umbilical hernia repair    . Transurethral resection of prostate      There were no vitals filed for this visit.  Visit Diagnosis:  Left-sided low back pain with left-sided sciatica  Weakness of both legs      Subjective Assessment - 03/12/16 1321    Subjective Pt. reports 0/10 LBP currently.     Patient Stated Goals "No pain"   Currently in Pain? No/denies   Pain Score 0-No pain   Multiple Pain Sites No      Today's treatment: Therex: NuStep 4 min, level 3  Manual:   B Hamstring stretch with strap 3x20"  B SKTC 3x20"  B KTOS Piriformis stretch 3x20"   B Thomas stretch 3x20" Therex: LTR x 20" each way Bridging x 10 reps Single leg bridging x 10 reps each side LE marching with abdominal marching x 10 each side Alternating Hip isometric / ABD/ER with blue TB around knees 3 x 5  reps each side B side lying clam shell with blue TB around knees x 10 reps Leg curl machine x 15 25#        PT Short Term Goals - 03/06/16 0902    PT SHORT TERM GOAL #1   Title Pt will be independent with initial HEP by 03/11/16  03/06/16: pt. verbalized / demo'd independence with initial HEP.     Status Achieved           PT Long Term Goals - 02/28/16 0904    PT LONG TERM GOAL #1   Title Pt will be independent with advanced HEP/gym program by 04/08/16   Status On-going   PT LONG TERM GOAL #2   Title Pt will demonstrate appropraite body mechanics for lifting to avoid further injury to low back by 04/08/16   Status On-going   PT LONG TERM GOAL #3   Title Pt will be able to perform normal daily tasks without limitation due to low back pain by 04/08/16   Status On-going   PT LONG TERM GOAL #4   Title Pt will be able to drive >1 hr without limitation due to low back pain by 04/08/16   Status On-going  Plan - 03/12/16 1510    Clinical Impression Statement Pt. tolerated all lubmopelvic strengthening well today with lower frequency of LBP instances with passive stretching and strengthening activity. Pt. initial LBP continues to be 0/10 today; with exception of occasional mild LBP with stretching, no pain with therex.  Flexion bias approach continued with all therex.     PT Next Visit Plan Progress LE flexibility and lumbar stabilization as able; Manual therapy and modalities PRN for pain        Problem List Patient Active Problem List   Diagnosis Date Noted  . Fatigue 12/25/2012  . LVH (left ventricular hypertrophy) 12/25/2012    Bess Harvest, PTA 03/12/2016, 3:15 PM  [facility] [address]  [place], [place], [address] Phone: [phone]   Fax:  [phone]  Name: Byard Duston MRN: [id number] Date of Birth: March 20, 1946

## 2016-03-13 ENCOUNTER — Ambulatory Visit: Payer: Medicare Other

## 2016-03-13 DIAGNOSIS — R29898 Other symptoms and signs involving the musculoskeletal system: Secondary | ICD-10-CM

## 2016-03-13 DIAGNOSIS — M5442 Lumbago with sciatica, left side: Secondary | ICD-10-CM

## 2016-03-13 NOTE — Therapy (Addendum)
[facility] [address]  [place], [place], [address] Phone: [phone]   Fax:  [phone]  Physical Therapy Treatment  Patient Details  Name: Martin Rubio MRN: [id number] Date of Birth: 10/22/1946 Referring Provider: Melida Gimenez, PA-C for Dr. Rolena Infante  Encounter Date: 03/13/2016      PT End of Session - 03/13/16 1026    Visit Number 7   Number of Visits 12   Date for PT Re-Evaluation 04/08/16   PT Start Time 1021   PT Stop Time 1102   PT Time Calculation (min) 41 min   Activity Tolerance Patient tolerated treatment well   Behavior During Therapy [facility] for tasks assessed/performed      Past Medical History  Diagnosis Date  . Bronchitis   . Thyroid disease   . Psoriatic arthritis ([place])   . FH: BPH (benign prostatic hypertrophy)   . Obesity   . Hyperlipidemia   . Diverticulosis   . HTN (hypertension)   . Sleep apnea     Controlled with weight loss    Past Surgical History  Procedure Laterality Date  . Knee surgery      bilateral arthroscopic  . Inguinal hernia repair      bilateral  . Umbilical hernia repair    . Transurethral resection of prostate      There were no vitals filed for this visit.      Subjective Assessment - 03/13/16 1024    Subjective Pt. reports 0/10 LBP currently.     Patient Stated Goals "No pain"   Currently in Pain? No/denies   Pain Score 0-No pain   Multiple Pain Sites No       Today's treatment: Therex: NuStep 4 min, level 5 Manual:   B Hamstring stretch with strap 3x20"  B SKTC 3x20"  B KTOS Piriformis stretch 3x20"   B Thomas stretch 3x20" Therex: LTR x 20" each way Alternating Hip isometric / ABD/ER with blue TB around knees 3 x 5 reps each side B side lying clam shell with blue TB around knees x 15 reps Leg curl machine x 15 25# Leg extension machine x 15 just eccentric #20 (5 sec on down count) TKE with black TB in door x 15  reps each leg           PT Short Term Goals - 03/06/16 0902    PT SHORT TERM GOAL #1   Title Pt will be independent with initial HEP by 03/11/16  03/06/16: pt. verbalized / demo'd independence with initial HEP.     Status Achieved           PT Long Term Goals - 02/28/16 0904    PT LONG TERM GOAL #1   Title Pt will be independent with advanced HEP/gym program by 04/08/16   Status On-going   PT LONG TERM GOAL #2   Title Pt will demonstrate appropraite body mechanics for lifting to avoid further injury to low back by 04/08/16   Status On-going   PT LONG TERM GOAL #3   Title Pt will be able to perform normal daily tasks without limitation due to low back pain by 04/08/16   Status On-going   PT LONG TERM GOAL #4   Title Pt will be able to drive >1 hr without limitation due to low back pain by 04/08/16   Status On-going               Plan -  03/13/16 1026    Clinical Impression Statement Pt. tolerated all lumbopelvic strengthening and stretching activity well today with initial LBP 0/10 which remained unchanged throughout treatment.  Pt. reports LBP continues to be intermittant.  B quad  strengthening additional focus today secondary to pt. reports of weakness descending stairs.     PT Next Visit Plan Progress LE flexibility and lumbar stabilization as able; Manual therapy and modalities PRN for pain      Patient will benefit from skilled therapeutic intervention in order to improve the following deficits and impairments:  Pain, Impaired flexibility, Decreased range of motion, Improper body mechanics, Decreased strength, Decreased activity tolerance  Visit Diagnosis: Left-sided low back pain with left-sided sciatica  Weakness of both legs     Problem List Patient Active Problem List   Diagnosis Date Noted  . Fatigue 12/25/2012  . LVH (left ventricular hypertrophy) 12/25/2012    Bess Harvest, PTA 03/13/2016, 1:14 PM  [facility] [address]  [place], [place], [address] Phone: [phone]   Fax:  [phone]  Name: Martin Rubio MRN: [id number] Date of Birth: August 01, 1946

## 2016-03-16 ENCOUNTER — Ambulatory Visit: Payer: Medicare Other | Admitting: Physical Therapy

## 2016-03-16 DIAGNOSIS — M5442 Lumbago with sciatica, left side: Secondary | ICD-10-CM

## 2016-03-16 DIAGNOSIS — M6281 Muscle weakness (generalized): Secondary | ICD-10-CM

## 2016-03-16 NOTE — Therapy (Signed)
[facility] [address]  [place], [place], [address] Phone: [phone]   Fax:  [phone]  Physical Therapy Treatment  Patient Details  Name: Martin Rubio MRN: [id number] Date of Birth: 11-Dec-1945 Referring Provider: Melida Gimenez, PA-C for Dr. Rolena Infante  Encounter Date: 03/16/2016      PT End of Session - 03/16/16 1329    Visit Number 8   Number of Visits 12   Date for PT Re-Evaluation 04/08/16   PT Start Time [id number]   PT Stop Time 1402   PT Time Calculation (min) 45 min   Activity Tolerance Patient tolerated treatment well   Behavior During Therapy [facility] for tasks assessed/performed      Past Medical History  Diagnosis Date  . Bronchitis   . Thyroid disease   . Psoriatic arthritis ([place])   . FH: BPH (benign prostatic hypertrophy)   . Obesity   . Hyperlipidemia   . Diverticulosis   . HTN (hypertension)   . Sleep apnea     Controlled with weight loss    Past Surgical History  Procedure Laterality Date  . Knee surgery      bilateral arthroscopic  . Inguinal hernia repair      bilateral  . Umbilical hernia repair    . Transurethral resection of prostate      There were no vitals filed for this visit.      Subjective Assessment - 03/16/16 1323    Subjective Pt denies pain currently other than when bending forward he notes a "slight twinge". Still reports some radicular symptoms at night.    Limitations Sitting;Standing   How long can you stand comfortably? 20 minutes   Patient Stated Goals "No pain"   Currently in Pain? No/denies            [facility] PT Assessment - 03/16/16 1317    Assessment   Medical Diagnosis Chronic L low back pain with L-sided sciatica   Referring Provider Melida Gimenez, PA-C for Dr. Rolena Infante   Onset Date/Surgical Date --  7 months ago per MD notes   Next MD Visit 03/30/16 with Dr. Rolena Infante   Prior Therapy none   Prior Function   Level of Independence Independent   Vocation Retired   Leisure [employer], Travel         Today's Treatment  TherEx NuStep 4 min, level 5  Manual B Hamstring stretch x30" B SKTC x30" B KTOS Piriformis stretch x30" B Thomas stretch x30"  TherEx  LTR with feet on orange (55 cm) Pball 10x3" DKTC with feet on orange (55 cm) Pball 10x3" Bridge with feet on orange (55 cm) Pball 10x3" Hooklying Alternating Hip ABD/ER with black TB x10 B sidelying clam shell with black TB x15 BATCA Low Row 20# x10, 25# x10         PT Short Term Goals - 03/06/16 0902    PT SHORT TERM GOAL #1   Title Pt will be independent with initial HEP by 03/11/16  03/06/16: pt. verbalized / demo'd independence with initial HEP.     Status Achieved           PT Long Term Goals - 03/16/16 1850    PT LONG TERM GOAL #1   Title Pt will be independent with advanced HEP/gym program by 04/08/16   Status On-going   PT LONG TERM GOAL #2   Title Pt will demonstrate appropraite body mechanics for lifting to avoid further  injury to low back by 04/08/16   Status On-going   PT LONG TERM GOAL #3   Title Pt will be able to perform normal daily tasks without limitation due to low back pain by 04/08/16   Status On-going   PT LONG TERM GOAL #4   Title Pt will be able to drive >1 hr without limitation due to low back pain by 04/08/16   Status On-going               Plan - 03/16/16 1847    Clinical Impression Statement Pt continues to report no pain in low back except slight twinge with foward trunk flexion and occasional radicular symptoms while in bed at night. Progressed resistance and introduced increased instability during exercises with pt able to tolerate progression wihout increased pain.   Rehab Potential Good   Clinical Impairments Affecting Rehab Potential B knee OA s/p arthroscpic surgeries and rooster comb injections; Psoriatic arthritis; Obesity; Aneurysm of the proximal left common iliac artery and 50% stenosis in iliac arteries   PT  Treatment/Interventions Patient/family education;Manual techniques;Taping;Dry needling;Neuromuscular re-education;Therapeutic exercise;Therapeutic activities;Ultrasound;Moist Heat;Electrical Stimulation;Cryotherapy;Iontophoresis 4mg /ml Dexamethasone;Traction   PT Next Visit Plan Progress LE flexibility and lumbar stabilization as able; Manual therapy and modalities PRN for pain   Consulted and Agree with Plan of Care Patient      Patient will benefit from skilled therapeutic intervention in order to improve the following deficits and impairments:  Pain, Impaired flexibility, Decreased range of motion, Improper body mechanics, Decreased strength, Decreased activity tolerance  Visit Diagnosis: Left-sided low back pain with left-sided sciatica  Muscle weakness (generalized)     Problem List Patient Active Problem List   Diagnosis Date Noted  . Fatigue 12/25/2012  . LVH (left ventricular hypertrophy) 12/25/2012    [name], PT, MPT 03/16/2016, 6:54 PM  [facility] [address]  [place], [place], [address] Phone: [phone]   Fax:  [phone]  Name: Martin Rubio MRN: [id number] Date of Birth: [birth year]

## 2016-03-17 ENCOUNTER — Ambulatory Visit: Payer: Medicare Other

## 2016-03-18 ENCOUNTER — Ambulatory Visit: Payer: Medicare Other

## 2016-03-18 DIAGNOSIS — M6281 Muscle weakness (generalized): Secondary | ICD-10-CM

## 2016-03-18 DIAGNOSIS — M5442 Lumbago with sciatica, left side: Secondary | ICD-10-CM

## 2016-03-18 NOTE — Therapy (Signed)
[facility] [address]  [place], [place], [address] Phone: [phone]   Fax:  [phone]  Physical Therapy Treatment  Patient Details  Name: Martin Rubio MRN: [id number] Date of Birth: [birth year] Referring Provider: Melida Gimenez, PA-C for Dr. Rolena Infante  Encounter Date: 03/18/2016      PT End of Session - 03/18/16 1045    Visit Number 9   Number of Visits 12   Date for PT Re-Evaluation 04/08/16   PT Start Time 1027   PT Stop Time 1106   PT Time Calculation (min) 39 min   Activity Tolerance Patient tolerated treatment well   Behavior During Therapy [facility] for tasks assessed/performed      Past Medical History  Diagnosis Date  . Bronchitis   . Thyroid disease   . Psoriatic arthritis ([place])   . FH: BPH (benign prostatic hypertrophy)   . Obesity   . Hyperlipidemia   . Diverticulosis   . HTN (hypertension)   . Sleep apnea     Controlled with weight loss    Past Surgical History  Procedure Laterality Date  . Knee surgery      bilateral arthroscopic  . Inguinal hernia repair      bilateral  . Umbilical hernia repair    . Transurethral resection of prostate      There were no vitals filed for this visit.      Subjective Assessment - 03/18/16 1031    Subjective Pt. reports R superior portion of foot has been hurting him at night with a crushing pain.    Patient Stated Goals "No pain"   Currently in Pain? No/denies   Pain Score 0-No pain   Multiple Pain Sites No      Today's Treatment  TherEx NuStep 4 min, level 5  Manual B Hamstring stretch x30" B SKTC x30" B KTOS Piriformis stretch x30" B Thomas stretch x30"  TherEx  Bridge with B hip abduction / ER with black TB x 10 reps  SL bridge x 10 each side  Straight leg bridge with heels on peanut p-ball x 10 reps LTR with feet on orange (55 cm) Pball 20" Bridge with feet on orange (55 cm) Pball 10x3" Hooklying Alternating Hip ABD/ER with  black TB x 5 reps   B side lying clam shell with blue TB x 15 reps each side BATCA HS curl machine x 15 reps 25 #          PT Short Term Goals - 03/06/16 0902    PT SHORT TERM GOAL #1   Title Pt will be independent with initial HEP by 03/11/16  03/06/16: pt. verbalized / demo'd independence with initial HEP.     Status Achieved           PT Long Term Goals - 03/16/16 1850    PT LONG TERM GOAL #1   Title Pt will be independent with advanced HEP/gym program by 04/08/16   Status On-going   PT LONG TERM GOAL #2   Title Pt will demonstrate appropraite body mechanics for lifting to avoid further injury to low back by 04/08/16   Status On-going   PT LONG TERM GOAL #3   Title Pt will be able to perform normal daily tasks without limitation due to low back pain by 04/08/16   Status On-going   PT LONG TERM GOAL #4   Title Pt will be able to drive >1 hr without limitation due to  low back pain by 04/08/16   Status On-going               Plan - 03/18/16 1045    Clinical Impression Statement Pt. continues to perform well with therex reporting only occasional LBP with transfers.  Pt. continues to demo marked B HS tightness; pt. instructed to adhere to HS stretching HEP for improved lumbopelvic ROM.     PT Treatment/Interventions Patient/family education;Manual techniques;Taping;Dry needling;Neuromuscular re-education;Therapeutic exercise;Therapeutic activities;Ultrasound;Moist Heat;Electrical Stimulation;Cryotherapy;Iontophoresis 4mg /ml Dexamethasone;Traction   PT Next Visit Plan Progress LE flexibility and lumbar stabilization as able; Manual therapy and modalities PRN for pain      Patient will benefit from skilled therapeutic intervention in order to improve the following deficits and impairments:  Pain, Impaired flexibility, Decreased range of motion, Improper body mechanics, Decreased strength, Decreased activity tolerance  Visit Diagnosis: Left-sided low back pain with left-sided  sciatica  Muscle weakness (generalized)     Problem List Patient Active Problem List   Diagnosis Date Noted  . Fatigue 12/25/2012  . LVH (left ventricular hypertrophy) 12/25/2012    Bess Harvest, PTA 03/18/2016, 3:12 PM  [facility] [address]  [place], [place], [address] Phone: [phone]   Fax:  [phone]  Name: Martin Rubio MRN: [id number] Date of Birth: 09/04/1946

## 2016-03-19 ENCOUNTER — Ambulatory Visit: Payer: Medicare Other

## 2016-03-23 ENCOUNTER — Ambulatory Visit: Payer: Medicare Other

## 2016-03-23 DIAGNOSIS — M6281 Muscle weakness (generalized): Secondary | ICD-10-CM

## 2016-03-23 DIAGNOSIS — M5442 Lumbago with sciatica, left side: Secondary | ICD-10-CM | POA: Diagnosis not present

## 2016-03-23 NOTE — Therapy (Signed)
[facility] [address]  [place], [place], [address] Phone: [phone]   Fax:  [phone]  Physical Therapy Treatment  Patient Details  Name: Martin Rubio MRN: [id number] Date of Birth: 03-30-1946 Referring Provider: Melida Gimenez, PA-C for Dr. Rolena Infante  Encounter Date: 03/23/2016      PT End of Session - 03/23/16 1317    Visit Number 10   Number of Visits 12   Date for PT Re-Evaluation 04/08/16   PT Start Time 1320   PT Stop Time 1400   PT Time Calculation (min) 40 min   Activity Tolerance Patient tolerated treatment well   Behavior During Therapy [facility] for tasks assessed/performed      Past Medical History  Diagnosis Date  . Bronchitis   . Thyroid disease   . Psoriatic arthritis ([place])   . FH: BPH (benign prostatic hypertrophy)   . Obesity   . Hyperlipidemia   . Diverticulosis   . HTN (hypertension)   . Sleep apnea     Controlled with weight loss    Past Surgical History  Procedure Laterality Date  . Knee surgery      bilateral arthroscopic  . Inguinal hernia repair      bilateral  . Umbilical hernia repair    . Transurethral resection of prostate      There were no vitals filed for this visit.      Subjective Assessment - 03/23/16 1512    Subjective Pt. continues to report R superior portion of foot has been hurting him at night with a crushing pain however no pain initially today.     Patient Stated Goals "No pain"   Currently in Pain? No/denies   Pain Score 0-No pain   Multiple Pain Sites No            [facility] PT Assessment - 03/23/16 1325    Observation/Other Assessments   Focus on Therapeutic Outcomes (FOTO)  66% status (34% limitation)        Today's Treatment:  TherEx: NuStep 4 min, level 4  Manual B Hamstring stretch x30" B SKTC x30" B KTOS Piriformis stretch x30" B Thomas stretch x30"  TherEx  Bridge with B hip abduction / ER with black TB 3 x 5 reps  SL  bridge x 10 each side  Bridge / HS curl with heels on peanut p-ball x 10 reps B side lying clam shell with blue TB x 10 reps each side Hooklying alternating marching with abdominal bracing x 10 reps each side Hooklying pullover with 8# dumbbell x 10 reps Hooklying LTR x 10 each way with 5" hold        PT Short Term Goals - 03/06/16 0902    PT SHORT TERM GOAL #1   Title Pt will be independent with initial HEP by 03/11/16  03/06/16: pt. verbalized / demo'd independence with initial HEP.     Status Achieved           PT Long Term Goals - 03/23/16 1334    PT LONG TERM GOAL #1   Title Pt will be independent with advanced HEP/gym program by 04/08/16   Status On-going   PT LONG TERM GOAL #2   Title Pt will demonstrate appropraite body mechanics for lifting to avoid further injury to low back by 04/08/16   Status On-going   PT LONG TERM GOAL #3   Title Pt will be able to perform normal daily tasks without limitation  due to low back pain by 04/08/16   Status Achieved  04/22/16: Pt. able to perform normal daily tasks without limitation from LBP pain.     PT LONG TERM GOAL #4   Title Pt will be able to drive >1 hr without limitation due to low back pain by 04/08/16   Status On-going               Plan - 04-22-16 1317    Clinical Impression Statement Pt. tolerated all lumbopelvic strengthening and stretching activity well today with no LBP or L sided hip pain reported.  Pt. reports he still wakes up at night with occasional LBP however is bothered less frequently with this.  No pain initially or with therex today.     PT Treatment/Interventions Patient/family education;Manual techniques;Taping;Dry needling;Neuromuscular re-education;Therapeutic exercise;Therapeutic activities;Ultrasound;Moist Heat;Electrical Stimulation;Cryotherapy;Iontophoresis 4mg /ml Dexamethasone;Traction   PT Next Visit Plan Progress LE flexibility and lumbar stabilization as able; Manual therapy and modalities PRN for  pain      Patient will benefit from skilled therapeutic intervention in order to improve the following deficits and impairments:  Pain, Impaired flexibility, Decreased range of motion, Improper body mechanics, Decreased strength, Decreased activity tolerance  Visit Diagnosis: Left-sided low back pain with left-sided sciatica  Muscle weakness (generalized)       G-Codes - 2016-04-22 1819    Functional Assessment Tool Used Lumbar Spine FOTO - 66% (34% limitation)   Functional Limitation Changing and maintaining body position   Changing and Maintaining Body Position Current Status [id number]) At least 20 percent but less than 40 percent impaired, limited or restricted   Changing and Maintaining Body Position Goal Status [id number]) At least 20 percent but less than 40 percent impaired, limited or restricted      Problem List Patient Active Problem List   Diagnosis Date Noted  . Fatigue 12/25/2012  . LVH (left ventricular hypertrophy) 12/25/2012    Bess Harvest, PTA 2016-04-22, 6:20 PM  [facility] [address]  [place], [place], [address] Phone: [phone]   Fax:  [phone]  Name: Martin Rubio MRN: [id number] Date of Birth: Jan 02, 1946  [name], PT, MPT 22-Apr-2016, 6:23 PM  [facility] [address]  [place], [place], [address] Phone: [phone]   Fax:  [phone]

## 2016-03-26 ENCOUNTER — Ambulatory Visit: Payer: Medicare Other | Admitting: Physical Therapy

## 2016-03-26 DIAGNOSIS — M5442 Lumbago with sciatica, left side: Secondary | ICD-10-CM | POA: Diagnosis not present

## 2016-03-26 DIAGNOSIS — M6281 Muscle weakness (generalized): Secondary | ICD-10-CM

## 2016-03-26 NOTE — Therapy (Signed)
[facility] [address]  [place], [place], [address] Phone: [phone]   Fax:  [phone]  Physical Therapy Treatment  Patient Details  Name: Martin Rubio MRN: [id number] Date of Birth: 01-Feb-1946 Referring Provider: Melida Gimenez, PA-C for Dr. Rolena Infante  Encounter Date: 03/26/2016      PT End of Session - 03/26/16 1331    Visit Number 11   Number of Visits 12   Date for PT Re-Evaluation 04/08/16   PT Start Time 1320   PT Stop Time 1400   PT Time Calculation (min) 40 min   Activity Tolerance Patient tolerated treatment well   Behavior During Therapy [facility] for tasks assessed/performed      Past Medical History  Diagnosis Date  . Bronchitis   . Thyroid disease   . Psoriatic arthritis ([place])   . FH: BPH (benign prostatic hypertrophy)   . Obesity   . Hyperlipidemia   . Diverticulosis   . HTN (hypertension)   . Sleep apnea     Controlled with weight loss    Past Surgical History  Procedure Laterality Date  . Knee surgery      bilateral arthroscopic  . Inguinal hernia repair      bilateral  . Umbilical hernia repair    . Transurethral resection of prostate      There were no vitals filed for this visit.      Subjective Assessment - 03/26/16 1326    Subjective Pt reports back pain continues to remain pretty well controlled. Some occasional discomfort while sleeping, but able to reposition and allieviate pain. Only pain recently has been in dorsal aspect of R foot and in knees from arthritis.   Patient Stated Goals "No pain"   Currently in Pain? No/denies   Pain Score 0  3-4/10 on average   Pain Location Foot   Pain Orientation Right;Upper  Dorsal aspect of 2nd & 3rd metatarsal heads          Today's Treatment:  TherEx: NuStep - lvl 4 x 6'  Manual B Hamstring stretch x30" B SKTC x30" B KTOS Piriformis stretch x30" B Thomas stretch x30"  TherEx  Review of current HEP  Bridge +  Alternating Hip ABD/ER with black TB x10 B side-lying Hip ABD/ER clam with black TB x 10  TrA + Hooklying alternating marching with black TB around knees x10  Bridge + HS curl with heels on peanut p-ball x10 SL bridge x10 each          PT Education - 03/26/16 1403    Education provided Yes   Education Details Updated HEP   Person(s) Educated Patient   Methods Explanation;Demonstration;Handout   Comprehension Verbalized understanding;Returned demonstration          PT Short Term Goals - 03/06/16 0902    PT SHORT TERM GOAL #1   Title Pt will be independent with initial HEP by 03/11/16  03/06/16: pt. verbalized / demo'd independence with initial HEP.     Status Achieved           PT Long Term Goals - 03/23/16 1334    PT LONG TERM GOAL #1   Title Pt will be independent with advanced HEP/gym program by 04/08/16   Status On-going   PT LONG TERM GOAL #2   Title Pt will demonstrate appropraite body mechanics for lifting to avoid further injury to low back by 04/08/16   Status On-going   PT LONG TERM  GOAL #3   Title Pt will be able to perform normal daily tasks without limitation due to low back pain by 04/08/16   Status Achieved  03/23/16: Pt. able to perform normal daily tasks without limitation from LBP pain.     PT LONG TERM GOAL #4   Title Pt will be able to drive >1 hr without limitation due to low back pain by 04/08/16   Status On-going               Plan - 03/26/16 1510    Clinical Impression Statement Pt has demonstrated good progress with PT with no recent LBP or radicular symptoms, with only recent pain from OA in knees and intermittent pain in dorsum of R foot (no pain today but ttp). Pt nearing end of current approved visits, is pleased with progress and feels ready to transition to HEP, therefore reviewed and updated HEP. Will f/u on next visit to identify any concerns or problems and complete discharge assessment.   PT Treatment/Interventions Patient/family  education;Manual techniques;Taping;Dry needling;Neuromuscular re-education;Therapeutic exercise;Therapeutic activities;Ultrasound;Moist Heat;Electrical Stimulation;Cryotherapy;Iontophoresis 4mg /ml Dexamethasone;Traction   PT Next Visit Plan Final HEP review, discharge assessment   Consulted and Agree with Plan of Care Patient      Patient will benefit from skilled therapeutic intervention in order to improve the following deficits and impairments:  Pain, Impaired flexibility, Decreased range of motion, Improper body mechanics, Decreased strength, Decreased activity tolerance  Visit Diagnosis: Left-sided low back pain with left-sided sciatica  Muscle weakness (generalized)     Problem List Patient Active Problem List   Diagnosis Date Noted  . Fatigue 12/25/2012  . LVH (left ventricular hypertrophy) 12/25/2012    [name], PT, MPT 03/26/2016, 3:20 PM  [facility] [address]  [place], [place], [address] Phone: [phone]   Fax:  [phone]  Name: Martin Rubio MRN: [id number] Date of Birth: February 23, 1946

## 2016-03-31 ENCOUNTER — Ambulatory Visit: Payer: Medicare Other | Admitting: Physical Therapy

## 2016-03-31 DIAGNOSIS — M5442 Lumbago with sciatica, left side: Secondary | ICD-10-CM | POA: Diagnosis not present

## 2016-03-31 DIAGNOSIS — M6281 Muscle weakness (generalized): Secondary | ICD-10-CM

## 2016-03-31 NOTE — Therapy (Signed)
[facility] [address]  [place], [place], [address] Phone: [phone]   Fax:  [phone]  Physical Therapy Treatment  Patient Details  Name: Martin Rubio MRN: [id number] Date of Birth: [birth year] Referring Provider: Melida Gimenez, PA-C for Dr. Rolena Infante  Encounter Date: 03/31/2016      PT End of Session - 03/31/16 0937    Visit Number 12   Number of Visits 12   PT Start Time 0932   PT Stop Time 1021   PT Time Calculation (min) 49 min   Activity Tolerance Patient tolerated treatment well   Behavior During Therapy [facility] for tasks assessed/performed      Past Medical History  Diagnosis Date  . Bronchitis   . Thyroid disease   . Psoriatic arthritis ([place])   . FH: BPH (benign prostatic hypertrophy)   . Obesity   . Hyperlipidemia   . Diverticulosis   . HTN (hypertension)   . Sleep apnea     Controlled with weight loss    Past Surgical History  Procedure Laterality Date  . Knee surgery      bilateral arthroscopic  . Inguinal hernia repair      bilateral  . Umbilical hernia repair    . Transurethral resection of prostate      There were no vitals filed for this visit.      Subjective Assessment - 03/31/16 0935    Subjective Pt saw MD yesterday and states he has been released, and he is good with today being his last day of therapy. Reports he was able to work out "hard" on Sat then put a bed together without any LBP. Only issue is still pain in his knees but feels if he is able to get back into his old work-out routine that this will improve.   Currently in Pain? No/denies            [facility] PT Assessment - 03/31/16 0932    Assessment   Medical Diagnosis Chronic L low back pain with L-sided sciatica   Onset Date/Surgical Date --  7 months ago per MD notes   Observation/Other Assessments   Focus on Therapeutic Outcomes (FOTO)  70% (30% limitation)   AROM   AROM Assessment Site Lumbar   Lumbar  Flexion WFL   Lumbar Extension WFL   Lumbar - Right Side Bend [facility]   Lumbar - Left Side Bend WFL   Lumbar - Right Rotation [facility]   Lumbar - Left Rotation [facility]   Strength   Strength Assessment Site Hip;Knee   Right/Left Hip Right;Left   Right Hip Flexion 4/5   Right Hip Extension 4/5   Right Hip ABduction 4+/5   Right Hip ADduction 4+/5   Left Hip Flexion 4/5   Left Hip Extension 4/5   Left Hip ABduction 4+/5   Left Hip ADduction 4/5   Right/Left Knee Right;Left   Right Knee Flexion 4+/5   Right Knee Extension 4+/5   Left Knee Flexion 4/5   Left Knee Extension 4+/5   Flexibility   Hamstrings WFL   Quadriceps WFL   ITB WFL   Piriformis mild tightness           Today's Treatment  TherEx NuStep - lvl 5 x 6' B Quad stretch  - pt instructed in sidelying and prone techniques using towel 2x30" each B Hamstring stretch 2x30" (pt reminded to use B SKTC x30" B KTOS Piriformis stretch x30" B Thomas stretch  x30" Bridge + Alternating Hip ABD/ER with black TB x10 B side-lying Hip ABD/ER clam with black TB x 10  TrA + Hooklying alternating marching with black TB around knees x10  SL bridge x10 each   TherAct Demonstrated proper lifting technique with pt able to perform return demonstration with good body mechanics:   Lift wooden box from floor <> mat table   Turning while carrying box avoiding twisting in back          PT Short Term Goals - 03/06/16 0902    PT SHORT TERM GOAL #1   Title Pt will be independent with initial HEP by 03/11/16  03/06/16: pt. verbalized / demo'd independence with initial HEP.     Status Achieved           PT Long Term Goals - 04-17-2016 0954    PT LONG TERM GOAL #1   Title Pt will be independent with advanced HEP/gym program by 04/08/16   Status Achieved   PT LONG TERM GOAL #2   Title Pt will demonstrate appropraite body mechanics for lifting to avoid further injury to low back by 04/08/16   Status Achieved   PT LONG TERM GOAL #3   Title  Pt will be able to perform normal daily tasks without limitation due to low back pain by 04/08/16   Status Achieved   PT LONG TERM GOAL #4   Title Pt will be able to drive >1 hr without limitation due to low back pain by 04/08/16   Status Unable to assess  Pt has not taken any longer car trips since starting therapy               Plan - 04/17/16 1024    Clinical Impression Statement Pt has demonstrated excellent progress with PT with resolution of LBP and left sided radicular symptoms, with only recent pain from OA in knees. Hip and knee strength has improved to 4/5 or greater and lumbar and proximal LE flexibility WFL other than mild tightness persisting in piriformis, L > R. Pt able to demonstrate all HEP stretches and exercises appropriately and verbalizes/demonstrates understanding of proper lifting technique with good body mechanics. All goals met for this episode other than unable to assess tolerance for driving >1 hr as pt has not attempted any longer road trips recently. Pt is pleased with progress and feels ready to transition to HEP, therefore will proceed with discharge from PT for this episode.    PT Treatment/Interventions --   PT Next Visit Plan Discharge   Consulted and Agree with Plan of Care Patient      Patient will benefit from skilled therapeutic intervention in order to improve the following deficits and impairments:  Pain, Impaired flexibility, Decreased range of motion, Improper body mechanics, Decreased strength, Decreased activity tolerance  Visit Diagnosis: Left-sided low back pain with left-sided sciatica  Muscle weakness (generalized)       G-Codes - 04/17/16 1034    Functional Assessment Tool Used Lumbar Spine FOTO - 70% (30% limitation)   Functional Limitation Changing and maintaining body position   Changing and Maintaining Body Position Goal Status (K8768) At least 20 percent but less than 40 percent impaired, limited or restricted   Changing and  Maintaining Body Position Discharge Status (T1572) At least 20 percent but less than 40 percent impaired, limited or restricted      Problem List Patient Active Problem List   Diagnosis Date Noted  . Fatigue 12/25/2012  . LVH (left  ventricular hypertrophy) 12/25/2012    [name], PT, MPT 03/31/2016, 10:35 AM  [facility] [address]  [place], [place], [address] Phone: [phone]   Fax:  [phone]  Name: Martin Rubio MRN: [id number] Date of Birth: 04/22/1946   PHYSICAL THERAPY DISCHARGE SUMMARY  Visits from Start of Care: 12  Current functional level related to goals / functional outcomes:   Pt has demonstrated excellent progress with PT with resolution of LBP and left sided radicular symptoms, with only recent pain from OA in knees. Hip and knee strength has improved to 4/5 or greater and lumbar and proximal LE flexibility WFL other than mild tightness persisting in piriformis, L > R. Pt able to demonstrate all HEP stretches and exercises appropriately and verbalizes/demonstrates understanding of proper lifting technique with good body mechanics. All goals met for this episode other than unable to assess tolerance for driving >1 hr as pt has not attempted any longer road trips recently. Pt is pleased with progress and feels ready to transition to HEP, therefore will proceed with discharge from PT for this episode.    Remaining deficits:   None   Education / Equipment:   HEP, Proper lifting technique  Plan: Patient agrees to discharge.  Patient goals were met. Patient is being discharged due to meeting the stated rehab goals.  ?????       [name], PT, MPT 03/31/2016, 10:39 AM  [facility] [address]  [place], [place], [address] Phone: [phone]   Fax:  [phone]

## 2016-05-08 ENCOUNTER — Other Ambulatory Visit: Payer: Self-pay | Admitting: Cardiology

## 2016-05-08 NOTE — Telephone Encounter (Signed)
Rx(s) sent to pharmacy electronically.  

## 2016-06-04 ENCOUNTER — Ambulatory Visit: Payer: Medicare Other | Admitting: Podiatry

## 2016-08-21 ENCOUNTER — Other Ambulatory Visit: Payer: Self-pay

## 2016-08-21 MED ORDER — METOPROLOL SUCCINATE ER 50 MG PO TB24: ORAL_TABLET | ORAL | 6 refills | Status: DC

## 2016-08-21 MED ORDER — METOPROLOL SUCCINATE ER 50 MG PO TB24: ORAL_TABLET | ORAL | 1 refills | Status: DC

## 2016-10-08 DIAGNOSIS — G629 Polyneuropathy, unspecified: Secondary | ICD-10-CM | POA: Insufficient documentation

## 2016-10-08 DIAGNOSIS — M4852XA Collapsed vertebra, not elsewhere classified, cervical region, initial encounter for fracture: Secondary | ICD-10-CM | POA: Insufficient documentation

## 2016-10-08 DIAGNOSIS — Z23 Encounter for immunization: Secondary | ICD-10-CM | POA: Insufficient documentation

## 2016-10-08 DIAGNOSIS — Z1211 Encounter for screening for malignant neoplasm of colon: Secondary | ICD-10-CM | POA: Insufficient documentation

## 2016-10-08 DIAGNOSIS — Z0000 Encounter for general adult medical examination without abnormal findings: Secondary | ICD-10-CM | POA: Insufficient documentation

## 2016-10-12 DIAGNOSIS — J209 Acute bronchitis, unspecified: Secondary | ICD-10-CM | POA: Insufficient documentation

## 2016-10-12 DIAGNOSIS — J4520 Mild intermittent asthma, uncomplicated: Secondary | ICD-10-CM | POA: Insufficient documentation

## 2016-10-13 DIAGNOSIS — E611 Iron deficiency: Secondary | ICD-10-CM | POA: Insufficient documentation

## 2016-10-13 DIAGNOSIS — E7800 Pure hypercholesterolemia, unspecified: Secondary | ICD-10-CM | POA: Insufficient documentation

## 2016-11-12 DIAGNOSIS — J45909 Unspecified asthma, uncomplicated: Secondary | ICD-10-CM | POA: Insufficient documentation

## 2016-12-09 DIAGNOSIS — L4050 Arthropathic psoriasis, unspecified: Secondary | ICD-10-CM | POA: Diagnosis not present

## 2016-12-09 NOTE — Progress Notes (Signed)
HPI The patient returns for follow up of HTN.  His last echo in Nov of 2016 demonstrated no septal hypertrophy.   He was having leg pain. However, this turned out probably to be some neuropathy and related to some back pain. He came back today because his blood pressures been creeping up. It's actually been in the 517O 160V systolic routinely than it ever was before.  The patient denies any new symptoms such as chest discomfort, neck or arm discomfort. There has been no new shortness of breath, PND or orthopnea. There have been no reported palpitations, presyncope or syncope.  Of note he reports his LDL was greater than 240 recently. He hasn't tolerated Crestor. He is not tolerating Zocor.  Allergies  Allergen Reactions  . Codeine     Tension, nasty feeling   . Prednisone Swelling  . Statins     Joint pain  . Sulfa Antibiotics     Current Outpatient Prescriptions  Medication Sig Dispense Refill  . Albuterol Sulfate (PROAIR RESPICLICK) 371 (90 Base) MCG/ACT AEPB Inhale 2 puffs into the lungs daily as needed. Reported on 02/26/2016    . alendronate (FOSAMAX) 70 MG tablet Take 70 mg by mouth once a week. Take with a full glass of water on an empty stomach.    . Certolizumab Pegol (CIMZIA ) Inject 1 mL into the skin every 6 (six) weeks.    . Cholecalciferol (VITAMIN D3) 1000 units CAPS Take 2 capsules by mouth daily.    . ferrous sulfate 325 (65 FE) MG tablet Take 325 mg by mouth daily with breakfast.    . levothyroxine (SYNTHROID, LEVOTHROID) 112 MCG tablet Take 112 mcg by mouth daily before breakfast.    . Magnesium 400 MG CAPS Take by mouth.    . Magnesium Hydroxide 400 MG CHEW Chew 1 tablet by mouth daily.    . metoprolol succinate (TOPROL-XL) 50 MG 24 hr tablet TAKE 1 TABLET (50 MG TOTAL) BY MOUTH DAILY. TAKE WITH OR IMMEDIATELY FOLLOWING A MEAL. 90 tablet 1  . Misc Natural Products (JOINT HEALTH) CAPS Take 1-2 capsules by mouth daily.    . Multiple Minerals-Vitamins (CALCIUM & VIT D3  BONE HEALTH PO) Take 1 tablet by mouth daily.    . rosuvastatin (CRESTOR) 20 MG tablet Take 20 mg by mouth 3 (three) times a week.    . tadalafil (CIALIS) 5 MG tablet Take 5 mg by mouth daily as needed.     . vitamin C (ASCORBIC ACID) 500 MG tablet Take 500 mg by mouth daily.     No current facility-administered medications for this visit.     Past Medical History:  Diagnosis Date  . Bronchitis   . Diverticulosis   . FH: BPH (benign prostatic hypertrophy)   . HTN (hypertension)   . Hyperlipidemia   . Obesity   . Psoriatic arthritis ([place])   . Sleep apnea    CPAP  . Thyroid disease     Past Surgical History:  Procedure Laterality Date  . INGUINAL HERNIA REPAIR     bilateral  . KNEE SURGERY     bilateral arthroscopic  . TRANSURETHRAL RESECTION OF PROSTATE    . UMBILICAL HERNIA REPAIR     ROS:    Otherwise as stated in the HPI and negative for all other systems.  PHYSICAL EXAM BP 132/90 (BP Location: Left Arm, Patient Position: Sitting, Cuff Size: Normal)   Pulse (!) 58   Ht 5\' 10"  (1.778 m)   Wt 212  lb 9.6 oz (96.4 kg)   BMI 30.50 kg/m  GENERAL:  Well appearing NECK:  No jugular venous distention, waveform within normal limits, carotid upstroke brisk and symmetric, no bruits, no thyromegaly LUNGS:  Clear to auscultation bilaterally CHEST:  Unremarkable HEART:  PMI not displaced or sustained,S1 and S2 within normal limits, no S3, no S4, no clicks, no rubs, apical systolic mid peaking radiating out the aortic outflow tract and increasing with a strain phase of Valsalva systolic murmur no, no diastolic murmurs ABD:  Flat, positive bowel sounds normal in frequency in pitch, positive midline bruits, no rebound, no guarding, no midline pulsatile mass, no hepatomegaly, no splenomegaly, well-healed surgical scar EXT:  2 plus pulses throughout, no edema, no cyanosis no clubbing  EKG:  Sinus rhythm, rate 58, left axis deviation, left ventricular hypertrophy, early transition in  lead V2, no acute ST-T wave changes, chronic lateral repolarization changes.  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 He has a 50% stenosis, iliac arteries. He's not having any symptoms related to this and he has good distal pulses.  He is due for follow up imaging in October.   Hypertension  The blood pressure is elevated at home but not today.  He will keep a BP diary.  I will adjust his meds based on this.    Septal hypertrophy This was mild or nonexistent on last ultrasound. No change in therapy is indicated. I'll follow this clinically  Dyslipidemia I'm going to get a copy of his outside lipids. Provided the actually is an LDL greater than 240 he's going to need a PCSK9 inhibitor. He's been intolerant of statins.  Obesity The patient understands the need to lose weight with diet and exercise. We have discussed specific strategies for this.

## 2016-12-10 ENCOUNTER — Ambulatory Visit (INDEPENDENT_AMBULATORY_CARE_PROVIDER_SITE_OTHER): Payer: Medicare Other | Admitting: Cardiology

## 2016-12-10 ENCOUNTER — Encounter: Payer: Self-pay | Admitting: Cardiology

## 2016-12-10 VITALS — BP 132/90 | HR 58 | Ht 70.0 in | Wt 212.6 lb

## 2016-12-10 DIAGNOSIS — I10 Essential (primary) hypertension: Secondary | ICD-10-CM | POA: Diagnosis not present

## 2016-12-10 DIAGNOSIS — I723 Aneurysm of iliac artery: Secondary | ICD-10-CM | POA: Diagnosis not present

## 2016-12-10 DIAGNOSIS — I517 Cardiomegaly: Secondary | ICD-10-CM

## 2016-12-10 NOTE — Patient Instructions (Signed)
Medication Instructions:  Continue current medications  Labwork: None Ordered  Testing/Procedures: None Ordered  Follow-Up: Your physician recommends that you schedule a follow-up appointment in: [place] physician wants you to follow-up in: 6 Months. You will receive a reminder letter in the mail two months in advance. If you don't receive a letter, please call our office to schedule the follow-up appointment.    Any Other Special Instructions Will Be Listed Below (If Applicable).   If you need a refill on your cardiac medications before your next appointment, please call your pharmacy.

## 2016-12-14 DIAGNOSIS — H02422 Myogenic ptosis of left eyelid: Secondary | ICD-10-CM | POA: Diagnosis not present

## 2016-12-14 DIAGNOSIS — H0279 Other degenerative disorders of eyelid and periocular area: Secondary | ICD-10-CM | POA: Diagnosis not present

## 2016-12-16 ENCOUNTER — Encounter: Payer: Self-pay | Admitting: Pharmacist Clinician (PhC)/ Clinical Pharmacy Specialist

## 2016-12-16 ENCOUNTER — Ambulatory Visit (INDEPENDENT_AMBULATORY_CARE_PROVIDER_SITE_OTHER): Payer: Medicare Other | Admitting: Pharmacist Clinician (PhC)/ Clinical Pharmacy Specialist

## 2016-12-16 DIAGNOSIS — I10 Essential (primary) hypertension: Secondary | ICD-10-CM | POA: Diagnosis not present

## 2016-12-16 DIAGNOSIS — E785 Hyperlipidemia, unspecified: Secondary | ICD-10-CM | POA: Diagnosis not present

## 2016-12-16 MED ORDER — VALSARTAN 160 MG PO TABS: 160.0000 mg | ORAL_TABLET | Freq: Every day | ORAL | 5 refills | Status: DC

## 2016-12-16 NOTE — Assessment & Plan Note (Signed)
Patient with elevated BP in the office, confirmed by home readings from past 2 weeks.  Will start him on valsartan 160 mg once daily and have him return to the office in 3 weeks for follow up.  His last SCr was 1.28, so will repeat at his return visit.  He is to continue with daily home BP monitoring and will email his readings to our office a few days before his return visit.

## 2016-12-16 NOTE — Progress Notes (Signed)
12/16/2016 Martin Rubio March 20, 1946 [id number]   HPI:  Martin Rubio is a 71 y.o. male patient of Dr [name], who presents today for a lipid clinic evaluation.  He also brings in his home BP readings and cuff for review.   His cardiac history is significant only for LVH.  He is asymptomatic.  His LDL cholesterol was 244 in early November, and a review of his chart shows that this is his baseline level.  He has been unable to tolerate high intensity statins due to pain in his joints and legs.  He does not recall having ever tried ezetimibe.  He also has rheumatoid arthritis, for which he previously took a biologic, but when he turned 63, the copay went up drastically and he was unable to afford.  He has not been able to tolerate any of the attempted statins long enough to determine if they would be effective.    He also comes into the office with his home BP machine (iHE[employer]), that connects directly to his iPAD.  His home readings have been consistently elevated 426-834 systolic range.  He takes nothing for this other than metoprolol.    Current Medications:  Hyperlipidemia - None   Hypertension - metoprolol succ 50 mg qd  Goals:  Hyperlipidemia - LDL < 100  Hypertension - BP < 130/80   Intolerant/previously tried:  Hyperlipidemia - rosuvastatin 20 mg daily, took x 2 weeks but then had to discontinue due to back and leg pains (Nov 2017).  Then was given simvastatin 40 mg daily, took for about 10 days, the same symptoms returned (Dec 2017).  He had also tried pravastatin 20 mg back in 2015 with similar results.  Family history:   Mother had MI in her late 61's, stroke in mid 31's, father with MI at 53 (fatal);  1 brother with MI in his early 29's.  Diet:   Eats both at home and in restaurants.  Admits to liking pastas, breads, rice (he enjoys all [place] and [place] foods).   Eggs regularly.  Exercise:    Nothing specific.  Belongs to [employer], but has not been in some time.  Labs:   10/2016 (in Care Everywhere) -  TC 326, TG 120, HDL 58, LDL 244  Current Outpatient Prescriptions  Medication Sig Dispense Refill  . Albuterol Sulfate (PROAIR RESPICLICK) 196 (90 Base) MCG/ACT AEPB Inhale 2 puffs into the lungs daily as needed. Reported on 02/26/2016    . alendronate (FOSAMAX) 70 MG tablet Take 70 mg by mouth once a week. Take with a full glass of water on an empty stomach.    . Certolizumab Pegol (CIMZIA [place]) Inject 1 mL into the skin every 6 (six) weeks.    . Cholecalciferol (VITAMIN D3) 1000 units CAPS Take 2 capsules by mouth daily.    . ferrous sulfate 325 (65 FE) MG tablet Take 325 mg by mouth daily with breakfast.    . levothyroxine (SYNTHROID, LEVOTHROID) 112 MCG tablet Take 112 mcg by mouth daily before breakfast.    . Magnesium 400 MG CAPS Take by mouth.    . Magnesium Hydroxide 400 MG CHEW Chew 1 tablet by mouth daily.    . metoprolol succinate (TOPROL-XL) 50 MG 24 hr tablet TAKE 1 TABLET (50 MG TOTAL) BY MOUTH DAILY. TAKE WITH OR IMMEDIATELY FOLLOWING A MEAL. 90 tablet 1  . Misc Natural Products (JOINT HEALTH) CAPS Take 1-2 capsules by mouth daily.    . Multiple Minerals-Vitamins (CALCIUM & VIT D3 BONE HEALTH  PO) Take 1 tablet by mouth daily.    . rosuvastatin (CRESTOR) 20 MG tablet Take 20 mg by mouth 3 (three) times a week.    . tadalafil (CIALIS) 5 MG tablet Take 5 mg by mouth daily as needed.     . valsartan (DIOVAN) 160 MG tablet Take 1 tablet (160 mg total) by mouth daily. 30 tablet 5  . vitamin C (ASCORBIC ACID) 500 MG tablet Take 500 mg by mouth daily.     No current facility-administered medications for this visit.     Allergies  Allergen Reactions  . Codeine     Tension, nasty feeling   . Prednisone Swelling  . Statins     Joint pain  . Sulfa Antibiotics     Past Medical History:  Diagnosis Date  . Bronchitis   . Diverticulosis   . FH: BPH (benign prostatic hypertrophy)   . HTN (hypertension)   . Hyperlipidemia   . Obesity   . Psoriatic  arthritis ([place])   . Sleep apnea    CPAP  . Thyroid disease     Blood pressure (!) 172/106, pulse 64.   Essential hypertension Patient with elevated BP in the office, confirmed by home readings from past 2 weeks.  Will start him on valsartan 160 mg once daily and have him return to the office in 3 weeks for follow up.  His last SCr was 1.28, so will repeat at his return visit.  He is to continue with daily home BP monitoring and will email his readings to our office a few days before his return visit.    Hyperlipidemia LDL goal <100 Patient with elevated LDL at 244.  Because he is primary prevention, his [place] score is only at 4.  He does not have xanthomas, although he does have a lipoma on his right shoulder.   We had a long discussion about PCSK-9 inhibitors, and even if we can get approved under the new indication, he would most likely be unable to afford.  We can look at patient assistance should we get that far.  For now he is willing to re-challenge with rosuvastatin.  He has the 20 mg tablets so I asked that he cut into quarters and take just 5 mg each week for the next 4 weeks.  If he tolerates this, will increase to 5 mg twice weekly.  He would also be interested in a research foundation study, but again he is primary prevention, so will look into seeing if he would qualify.   [name] PharmD CPP [facility]

## 2016-12-16 NOTE — Patient Instructions (Addendum)
Return for a a follow up appointment in 1 month  EMAIL YOUR BP READINGS ABOUT 3-4 DAYS PRIOR TO YOUR NEXT APPT  Your blood pressure today is 172/106  (goal is < 130/80)  Check your blood pressure at home daily and keep record of the readings.  Take your BP meds as follows:  Start valsartan 160 mg once daily.  Start rosuvastatin 5 mg (1/4 tablet) once weekly   Bring all of your meds, your BP cuff and your record of home blood pressures to your next appointment.  Exercise as you're able, try to walk approximately 30 minutes per day.  Keep salt intake to a minimum, especially watch canned and prepared boxed foods.  Eat more fresh fruits and vegetables and fewer canned items.  Avoid eating in fast food restaurants.    HOW TO TAKE YOUR BLOOD PRESSURE: . Rest 5 minutes before taking your blood pressure. .  Don't smoke or drink caffeinated beverages for at least 30 minutes before. . Take your blood pressure before (not after) you eat. . Sit comfortably with your back supported and both feet on the floor (don't cross your legs). . Elevate your arm to heart level on a table or a desk. . Use the proper sized cuff. It should fit smoothly and snugly around your bare upper arm. There should be enough room to slip a fingertip under the cuff. The bottom edge of the cuff should be 1 inch above the crease of the elbow. . Ideally, take 3 measurements at one sitting and record the average.

## 2016-12-16 NOTE — Assessment & Plan Note (Signed)
Patient with elevated LDL at 244.  Because he is primary prevention, his [place] score is only at 4.  He does not have xanthomas, although he does have a lipoma on his right shoulder.   We had a long discussion about PCSK-9 inhibitors, and even if we can get approved under the new indication, he would most likely be unable to afford.  We can look at patient assistance should we get that far.  For now he is willing to re-challenge with rosuvastatin.  He has the 20 mg tablets so I asked that he cut into quarters and take just 5 mg each week for the next 4 weeks.  If he tolerates this, will increase to 5 mg twice weekly.  He would also be interested in a research foundation study, but again he is primary prevention, so will look into seeing if he would qualify.

## 2017-01-06 DIAGNOSIS — L4050 Arthropathic psoriasis, unspecified: Secondary | ICD-10-CM | POA: Diagnosis not present

## 2017-01-12 DIAGNOSIS — J4521 Mild intermittent asthma with (acute) exacerbation: Secondary | ICD-10-CM | POA: Diagnosis not present

## 2017-01-13 ENCOUNTER — Telehealth: Payer: Self-pay | Admitting: Cardiology

## 2017-01-13 NOTE — Telephone Encounter (Signed)
Patient has bronchitis, did have a fever this morning but has cleared since then. Patient is on the 3rd day of taking antibiotics and patient would like to know if he should come to appointment or not. Please advise, thanks.

## 2017-01-13 NOTE — Telephone Encounter (Signed)
RESCHEDULE FOR 01/22/17 AT 2 PM

## 2017-01-14 ENCOUNTER — Ambulatory Visit: Payer: Medicare Other

## 2017-01-20 DIAGNOSIS — E669 Obesity, unspecified: Secondary | ICD-10-CM | POA: Diagnosis not present

## 2017-01-20 DIAGNOSIS — M79671 Pain in right foot: Secondary | ICD-10-CM | POA: Diagnosis not present

## 2017-01-20 DIAGNOSIS — L409 Psoriasis, unspecified: Secondary | ICD-10-CM | POA: Diagnosis not present

## 2017-01-20 DIAGNOSIS — L4050 Arthropathic psoriasis, unspecified: Secondary | ICD-10-CM | POA: Diagnosis not present

## 2017-01-20 DIAGNOSIS — M545 Low back pain: Secondary | ICD-10-CM | POA: Diagnosis not present

## 2017-01-20 DIAGNOSIS — M150 Primary generalized (osteo)arthritis: Secondary | ICD-10-CM | POA: Diagnosis not present

## 2017-01-20 DIAGNOSIS — R062 Wheezing: Secondary | ICD-10-CM | POA: Diagnosis not present

## 2017-01-20 DIAGNOSIS — Z6830 Body mass index (BMI) 30.0-30.9, adult: Secondary | ICD-10-CM | POA: Diagnosis not present

## 2017-01-22 ENCOUNTER — Other Ambulatory Visit: Payer: Self-pay | Admitting: Cardiology

## 2017-01-22 ENCOUNTER — Ambulatory Visit (INDEPENDENT_AMBULATORY_CARE_PROVIDER_SITE_OTHER): Payer: Medicare Other | Admitting: Pharmacist Clinician (PhC)/ Clinical Pharmacy Specialist

## 2017-01-22 VITALS — BP 146/84 | HR 61

## 2017-01-22 DIAGNOSIS — I10 Essential (primary) hypertension: Secondary | ICD-10-CM | POA: Diagnosis not present

## 2017-01-22 DIAGNOSIS — E785 Hyperlipidemia, unspecified: Secondary | ICD-10-CM | POA: Diagnosis not present

## 2017-01-22 MED ORDER — VALSARTAN 320 MG PO TABS: 320.0000 mg | ORAL_TABLET | Freq: Every day | ORAL | 6 refills | Status: DC

## 2017-01-22 NOTE — Patient Instructions (Addendum)
Return for a a follow up appointment in 1 month  Go to lab today for blood draw to check kidney function.  Your blood pressure today is 146/84 (goal is < 130/80)  Check your blood pressure at home daily and keep record of the readings.  Take your BP meds as follows:  Increase valsartan to 320 mg daily (take 2 of the 160 mg tabs daily until they are gone).   Hold rosuvastatin dose next week.  If swelling in hands goes down, then rechallenge 1 week later to see if swelling returns.  If swelling does not go down, you can continue with the medication once weekly.  Bring all of your meds, your BP cuff and your record of home blood pressures to your next appointment.  Exercise as you're able, try to walk approximately 30 minutes per day.  Keep salt intake to a minimum, especially watch canned and prepared boxed foods.  Eat more fresh fruits and vegetables and fewer canned items.  Avoid eating in fast food restaurants.    HOW TO TAKE YOUR BLOOD PRESSURE: . Rest 5 minutes before taking your blood pressure. .  Don't smoke or drink caffeinated beverages for at least 30 minutes before. . Take your blood pressure before (not after) you eat. . Sit comfortably with your back supported and both feet on the floor (don't cross your legs). . Elevate your arm to heart level on a table or a desk. . Use the proper sized cuff. It should fit smoothly and snugly around your bare upper arm. There should be enough room to slip a fingertip under the cuff. The bottom edge of the cuff should be 1 inch above the crease of the elbow. . Ideally, take 3 measurements at one sitting and record the average.

## 2017-01-22 NOTE — Progress Notes (Signed)
01/22/2017 Martin Rubio 1946/06/08 [id number]   HPI:  Martin Rubio is a 71 y.o. male patient of Dr [name], who presents today for a lipid and hypertension follow up.  Last month he brought his home blood pressure cuff to the office and it read similar to the office cuff.  We started him on valsartan 160 mg daily, for which he notes no side effects or concerns.  His cardiac history is significant only for asymptomatic LVH.  His LDL cholesterol was 244 in early November, and a review of his chart shows that this is his baseline level.  He has been unable to tolerate high intensity statins due to pain in his joints and legs.  He does not recall having ever tried ezetimibe.  He also has rheumatoid arthritis, for which he previously took a biologic, but when he turned 63, the copay went up drastically and he was unable to afford.  He has not been able to tolerate any of the attempted statins long enough to determine if they would be effective.       Current Medications:  Hyperlipidemia - rosuvastatin 10 mg once weekly   Hypertension - metoprolol succ 50 mg qd, valsartan 160 mg qd  Goals:  Hyperlipidemia - LDL < 100  Hypertension - BP < 130/80   Intolerant/previously tried:  Hyperlipidemia - rosuvastatin 20 mg daily, took x 2 weeks but then had to discontinue due to back and leg pains (Nov 2017).  Then was given simvastatin 40 mg daily, took for about 10 days, the same symptoms returned (Dec 2017).  He had also tried pravastatin 20 mg back in 2015 with similar results.  Family history:   Mother had MI in her late 81's, stroke in mid 59's, father with MI at 30 (fatal);  1 brother with MI in his early 107's.  Diet:   Eats both at home and in restaurants.  Admits to liking pastas, breads, rice (he enjoys all [place] and [place] foods).   Eggs regularly.  Exercise:    Nothing specific.  Belongs to [employer], but has not been in some time.  Labs:  10/2016 (in Care Everywhere) -  TC 326, TG  120, HDL 58, LDL 244  Home BP readings:  Average of morning readings for past 15 days shows 143/89, range of all readings 121-162/75-100, with most diastolic readings still > 80.    Current Outpatient Prescriptions  Medication Sig Dispense Refill  . alendronate (FOSAMAX) 70 MG tablet Take 70 mg by mouth once a week. Take with a full glass of water on an empty stomach.    . Certolizumab Pegol (CIMZIA [place]) Inject 1 mL into the skin every 6 (six) weeks.    . Cholecalciferol (VITAMIN D3) 1000 units CAPS Take 2 capsules by mouth daily.    . ferrous sulfate 325 (65 FE) MG tablet Take 325 mg by mouth daily with breakfast.    . levothyroxine (SYNTHROID, LEVOTHROID) 112 MCG tablet Take 112 mcg by mouth daily before breakfast.    . Magnesium 400 MG CAPS Take by mouth.    . Magnesium Hydroxide 400 MG CHEW Chew 1 tablet by mouth daily.    . metoprolol succinate (TOPROL-XL) 50 MG 24 hr tablet TAKE 1 TABLET (50 MG TOTAL) BY MOUTH DAILY. TAKE WITH OR IMMEDIATELY FOLLOWING A MEAL. 90 tablet 1  . Misc Natural Products (JOINT HEALTH) CAPS Take 1-2 capsules by mouth daily.    . Multiple Minerals-Vitamins (CALCIUM & VIT D3 BONE HEALTH PO) Take  1 tablet by mouth daily.    . rosuvastatin (CRESTOR) 20 MG tablet Take 20 mg by mouth 3 (three) times a week.    . tadalafil (CIALIS) 5 MG tablet Take 5 mg by mouth daily as needed.     . valsartan (DIOVAN) 160 MG tablet Take 1 tablet (160 mg total) by mouth daily. 30 tablet 5  . vitamin C (ASCORBIC ACID) 500 MG tablet Take 500 mg by mouth daily.     No current facility-administered medications for this visit.     Allergies  Allergen Reactions  . Codeine     Tension, nasty feeling   . Prednisone Swelling  . Statins     Joint pain  . Sulfa Antibiotics     Past Medical History:  Diagnosis Date  . Bronchitis   . Diverticulosis   . FH: BPH (benign prostatic hypertrophy)   . HTN (hypertension)   . Hyperlipidemia   . Obesity   . Psoriatic arthritis ([place])   .  Sleep apnea    CPAP  . Thyroid disease     There were no vitals taken for this visit.   No problem-specific Assessment & Plan notes found for this encounter.   [name] PharmD CPP [facility]

## 2017-01-22 NOTE — Assessment & Plan Note (Signed)
Blood pressure improved since starting valsartan, but not yet to goal.  Will increase dose to 320 mg daily and have him go to lab today for BMET.   Will see him back in a month for follow up.

## 2017-01-22 NOTE — Assessment & Plan Note (Signed)
Patient on rosuvastatin 10 mg once weekly.  Has noticed some swelling/pain in his hands, not sure if related to rosuvastatin or his RA flaring up.  Advised he hold next dose of rosuvastatin and then re-challenge if swelling goes down.  Will repeat labs after next visit if he is able to continue

## 2017-01-23 LAB — BASIC METABOLIC PANEL
BUN: 15 mg/dL (ref 7–25)
CO2: 23 mmol/L (ref 20–31)
Calcium: 9.2 mg/dL (ref 8.6–10.3)
Chloride: 104 mmol/L (ref 98–110)
Creat: 1.02 mg/dL (ref 0.70–1.18)
GLUCOSE: 81 mg/dL (ref 65–99)
Potassium: 4.2 mmol/L (ref 3.5–5.3)
SODIUM: 139 mmol/L (ref 135–146)

## 2017-01-27 ENCOUNTER — Emergency Department (HOSPITAL_BASED_OUTPATIENT_CLINIC_OR_DEPARTMENT_OTHER): Payer: Medicare Other

## 2017-01-27 ENCOUNTER — Emergency Department (HOSPITAL_BASED_OUTPATIENT_CLINIC_OR_DEPARTMENT_OTHER)
Admission: EM | Admit: 2017-01-27 | Discharge: 2017-01-27 | Disposition: A | Discharge status: Home / Self Care | Payer: Medicare Other | Attending: Emergency Medicine | Admitting: Emergency Medicine

## 2017-01-27 ENCOUNTER — Encounter (HOSPITAL_BASED_OUTPATIENT_CLINIC_OR_DEPARTMENT_OTHER): Payer: Self-pay | Admitting: *Deleted

## 2017-01-27 DIAGNOSIS — I75022 Atheroembolism of left lower extremity: Secondary | ICD-10-CM | POA: Insufficient documentation

## 2017-01-27 DIAGNOSIS — I10 Essential (primary) hypertension: Secondary | ICD-10-CM | POA: Insufficient documentation

## 2017-01-27 DIAGNOSIS — Z87891 Personal history of nicotine dependence: Secondary | ICD-10-CM | POA: Diagnosis not present

## 2017-01-27 DIAGNOSIS — M7989 Other specified soft tissue disorders: Secondary | ICD-10-CM | POA: Insufficient documentation

## 2017-01-27 DIAGNOSIS — Z79899 Other long term (current) drug therapy: Secondary | ICD-10-CM | POA: Diagnosis not present

## 2017-01-27 LAB — CBC
HCT: 43.7 % (ref 39.0–52.0)
Hemoglobin: 14.2 g/dL (ref 13.0–17.0)
MCH: 28.5 pg (ref 26.0–34.0)
MCHC: 32.5 g/dL (ref 30.0–36.0)
MCV: 87.8 fL (ref 78.0–100.0)
Platelets: 152 10*3/uL (ref 150–400)
RBC: 4.98 MIL/uL (ref 4.22–5.81)
RDW: 16 % — ABNORMAL HIGH (ref 11.5–15.5)
WBC: 7.9 10*3/uL (ref 4.0–10.5)

## 2017-01-27 LAB — BASIC METABOLIC PANEL
Anion gap: 7 (ref 5–15)
BUN: 13 mg/dL (ref 6–20)
CO2: 26 mmol/L (ref 22–32)
Calcium: 8.8 mg/dL — ABNORMAL LOW (ref 8.9–10.3)
Chloride: 106 mmol/L (ref 101–111)
Creatinine, Ser: 1.06 mg/dL (ref 0.61–1.24)
GFR calc non Af Amer: >60 mL/min (ref >60)
GLUCOSE: 90 mg/dL (ref 65–99)
Potassium: 4.1 mmol/L (ref 3.5–5.1)
Sodium: 139 mmol/L (ref 135–145)

## 2017-01-27 NOTE — ED Provider Notes (Signed)
[facility] DEPT [facility] Provider Note   CSN: [id number] Arrival date & time: 01/27/17  1248     History   Chief Complaint Chief Complaint  Patient presents with  . Leg Swelling    HPI Martin Rubio is a 71 y.o. male.  The history is provided by the patient.   CC: swelling  Onset/Duration: 3 days Timing: gradual, constant, worsening Location: left leg Quality: swelling Severity: moderate Modifying Factors:  Improved by: nothing  Worsened by: nothing Associated Signs/Symptoms:  Pertinent (+): discolorization of the left foot, foot pain (a little more that normal)  Pertinent (-): Chest pain, shortness of breath, nausea, vomiting, diarrhea, abdominal pain.  Context: Patient was sent from his primary care provider for possibility of "blue toe syndrome." Patient and family report that the patient has been lying on the couch a lot since he had the flu several weeks ago and has not been getting up much during the day.  They deny any recent trips, history of cancer, hormone replacement therapy, prior history of blood clots. The deny any previous history of vascular surgery. They do report that the patient hasn't iliac aneurysm that his cardiologist has been keeping an eye on for several years. Last check aneurysm had been stable.  Past Medical History:  Diagnosis Date  . Bronchitis   . Diverticulosis   . FH: BPH (benign prostatic hypertrophy)   . HTN (hypertension)   . Hyperlipidemia   . Obesity   . Psoriatic arthritis ([place])   . Sleep apnea    CPAP  . Thyroid disease     Patient Active Problem List   Diagnosis Date Noted  . Essential hypertension 12/16/2016  . Hyperlipidemia LDL goal <100 12/16/2016  . Fatigue 12/25/2012  . LVH (left ventricular hypertrophy) 12/25/2012    Past Surgical History:  Procedure Laterality Date  . INGUINAL HERNIA REPAIR     bilateral  . KNEE SURGERY     bilateral arthroscopic  . TRANSURETHRAL RESECTION OF PROSTATE    . UMBILICAL  HERNIA REPAIR         Home Medications    Prior to Admission medications   Medication Sig Start Date End Date Taking? Authorizing Provider  alendronate (FOSAMAX) 70 MG tablet Take 70 mg by mouth once a week. Take with a full glass of water on an empty stomach.    Historical Provider, MD  Certolizumab Pegol (CIMZIA [place]) Inject 1 mL into the skin every 6 (six) weeks.    Historical Provider, MD  Cholecalciferol (VITAMIN D3) 1000 units CAPS Take 2 capsules by mouth daily.    Historical Provider, MD  ferrous sulfate 325 (65 FE) MG tablet Take 325 mg by mouth daily with breakfast.    Historical Provider, MD  levothyroxine (SYNTHROID, LEVOTHROID) 112 MCG tablet Take 112 mcg by mouth daily before breakfast.    Historical Provider, MD  Magnesium 400 MG CAPS Take by mouth.    Historical Provider, MD  Magnesium Hydroxide 400 MG CHEW Chew 1 tablet by mouth daily.    Historical Provider, MD  metoprolol succinate (TOPROL-XL) 50 MG 24 hr tablet TAKE 1 TABLET (50 MG TOTAL) BY MOUTH DAILY. TAKE WITH OR IMMEDIATELY FOLLOWING A MEAL. 08/21/16   Minus Breeding, MD  Misc Natural Products [facility]) CAPS Take 1-2 capsules by mouth daily.    Historical Provider, MD  Multiple Minerals-Vitamins (CALCIUM & VIT D3 BONE HEALTH PO) Take 1 tablet by mouth daily.    Historical Provider, MD  rosuvastatin (CRESTOR) 20 MG tablet  Take 20 mg by mouth 3 (three) times a week.    Historical Provider, MD  tadalafil (CIALIS) 5 MG tablet Take 5 mg by mouth daily as needed.     Historical Provider, MD  valsartan (DIOVAN) 320 MG tablet Take 1 tablet (320 mg total) by mouth daily. 01/22/17   Minus Breeding, MD  vitamin C (ASCORBIC ACID) 500 MG tablet Take 500 mg by mouth daily.    Historical Provider, MD    Family History Family History  Problem Relation Age of Onset  . CAD Father 57    Died age 37  . CAD Mother 33    CABG  . Diabetes Brother     Social History Social History  Substance Use Topics  . Smoking status:  Former Smoker    Types: Cigarettes  . Smokeless tobacco: Never Used     Comment: Quit 30 years ago.  . Alcohol use No     Allergies   Codeine; Prednisone; Statins; and Sulfa antibiotics   Review of Systems Review of Systems Ten systems are reviewed and are negative for acute change except as noted in the HPI   Physical Exam Updated Vital Signs BP 150/97 (BP Location: Right Arm)   Pulse (!) 59   Temp 98.6 F (37 C) (Oral)   Resp 20   Ht 5\' 10"  (1.778 m)   Wt 214 lb (97.1 kg)   SpO2 96%   BMI 30.71 kg/m   Physical Exam  Constitutional: He is oriented to person, place, and time. He appears well-developed and well-nourished. No distress.  HENT:  Head: Normocephalic and atraumatic.  Nose: Nose normal.  Eyes: Conjunctivae and EOM are normal. Pupils are equal, round, and reactive to light. Right eye exhibits no discharge. Left eye exhibits no discharge. No scleral icterus.  Neck: Normal range of motion. Neck supple.  Cardiovascular: Normal rate and regular rhythm.  Exam reveals no gallop and no friction rub.   No murmur heard. Pulses:      Dorsalis pedis pulses are 2+ on the right side, and 2+ on the left side.       Posterior tibial pulses are 1+ on the right side, and 1+ on the left side.  Pulmonary/Chest: Effort normal and breath sounds normal. No stridor. No respiratory distress. He has no rales.  Abdominal: Soft. He exhibits no distension. There is no tenderness.  Musculoskeletal: He exhibits no edema or tenderness.  Left leg swelling.  Neurological: He is alert and oriented to person, place, and time.  Skin: Skin is warm and dry. No rash noted. He is not diaphoretic. No erythema.  Lighter color to left foot than right.  Psychiatric: He has a normal mood and affect.  Vitals reviewed.    ED Treatments / Results  Labs (all labs ordered are listed, but only abnormal results are displayed) Labs Reviewed  BASIC METABOLIC PANEL - Abnormal; Notable for the following:        Result Value   Calcium 8.8 (*)    All other components within normal limits  CBC - Abnormal; Notable for the following:    RDW 16.0 (*)    All other components within normal limits    EKG  EKG Interpretation None       Radiology US Venous Img Lower Unilateral Left  Result Date: 01/27/2017 CLINICAL DATA:  Swallow left foot. EXAM: Left LOWER EXTREMITY VENOUS DOPPLER ULTRASOUND TECHNIQUE: Gray-scale sonography with graded compression, as well as color Doppler and duplex ultrasound were performed  to evaluate the lower extremity deep venous systems from the level of the common femoral vein and including the common femoral, femoral, profunda femoral, popliteal and calf veins including the posterior tibial, peroneal and gastrocnemius veins when visible. The superficial great saphenous vein was also interrogated. Spectral Doppler was utilized to evaluate flow at rest and with distal augmentation maneuvers in the common femoral, femoral and popliteal veins. COMPARISON:  No recent prior. FINDINGS: Contralateral Common Femoral Vein: Respiratory phasicity is normal and symmetric with the symptomatic side. No evidence of thrombus. Normal compressibility. Common Femoral Vein: No evidence of thrombus. Normal compressibility, respiratory phasicity and response to augmentation. Saphenofemoral Junction: No evidence of thrombus. Normal compressibility and flow on color Doppler imaging. Profunda Femoral Vein: No evidence of thrombus. Normal compressibility and flow on color Doppler imaging. Femoral Vein: No evidence of thrombus. Normal compressibility, respiratory phasicity and response to augmentation. Popliteal Vein: No evidence of thrombus. Normal compressibility, respiratory phasicity and response to augmentation. Calf Veins: No evidence of thrombus. Normal compressibility and flow on color Doppler imaging. Superficial Great Saphenous Vein: No evidence of thrombus. Normal compressibility and flow on color  Doppler imaging. Other Findings:  None. IMPRESSION: No evidence of deep venous thrombosis. Electronically Signed   By: Marcello Moores  Register   On: 01/27/2017 15:56    Procedures Procedures (including critical care time)  Medications Ordered in ED Medications - No data to display   Initial Impression / Assessment and Plan / ED Course  I have reviewed the triage vital signs and the nursing notes.  Pertinent labs & imaging results that were available during my care of the patient were reviewed by me and considered in my medical decision making (see chart for details).      Intact DP and PT pulses that are symmetric bilaterally. Low suspicion for acute arterial occlusion. Given patient's history of recent immobility and the asymmetry of swelling, would like to rule out DVT.  [place] negative for DVT. The patient is safe for discharge with strict return precautions with close follow up with PCP.   Final Clinical Impressions(s) / ED Diagnoses   Final diagnoses:  Leg swelling   Disposition: Discharge  Condition: Good  I have discussed the results, Dx and Tx plan with the patient who expressed understanding and agree(s) with the plan. Discharge instructions discussed at great length. The patient was given strict return precautions who verbalized understanding of the instructions. No further questions at time of discharge.    Discharge Medication List as of 01/27/2017  4:02 PM      Follow Up: Libby Maw, MD [place] [address] [phone]  Schedule an appointment as soon as possible for a visit  As needed      Fatima Blank, MD 01/27/17 [phone]

## 2017-01-27 NOTE — ED Notes (Signed)
Pt brought back from [facility]

## 2017-01-27 NOTE — ED Triage Notes (Signed)
Pt c/o leg swelling and pain x 3 days , sent here from [place] office for [place]

## 2017-01-28 DIAGNOSIS — H02422 Myogenic ptosis of left eyelid: Secondary | ICD-10-CM | POA: Diagnosis not present

## 2017-02-04 DIAGNOSIS — L4050 Arthropathic psoriasis, unspecified: Secondary | ICD-10-CM | POA: Diagnosis not present

## 2017-02-04 DIAGNOSIS — Z79899 Other long term (current) drug therapy: Secondary | ICD-10-CM | POA: Diagnosis not present

## 2017-02-08 DIAGNOSIS — G4733 Obstructive sleep apnea (adult) (pediatric): Secondary | ICD-10-CM | POA: Diagnosis not present

## 2017-02-08 DIAGNOSIS — J4520 Mild intermittent asthma, uncomplicated: Secondary | ICD-10-CM | POA: Diagnosis not present

## 2017-02-08 DIAGNOSIS — Z9989 Dependence on other enabling machines and devices: Secondary | ICD-10-CM | POA: Diagnosis not present

## 2017-02-15 DIAGNOSIS — Z85828 Personal history of other malignant neoplasm of skin: Secondary | ICD-10-CM | POA: Diagnosis not present

## 2017-02-15 DIAGNOSIS — D692 Other nonthrombocytopenic purpura: Secondary | ICD-10-CM | POA: Diagnosis not present

## 2017-02-15 DIAGNOSIS — Z08 Encounter for follow-up examination after completed treatment for malignant neoplasm: Secondary | ICD-10-CM | POA: Diagnosis not present

## 2017-02-15 DIAGNOSIS — L570 Actinic keratosis: Secondary | ICD-10-CM | POA: Diagnosis not present

## 2017-02-18 DIAGNOSIS — M5442 Lumbago with sciatica, left side: Secondary | ICD-10-CM | POA: Diagnosis not present

## 2017-02-18 DIAGNOSIS — N400 Enlarged prostate without lower urinary tract symptoms: Secondary | ICD-10-CM | POA: Diagnosis not present

## 2017-02-18 DIAGNOSIS — R202 Paresthesia of skin: Secondary | ICD-10-CM | POA: Diagnosis not present

## 2017-02-18 DIAGNOSIS — R201 Hypoesthesia of skin: Secondary | ICD-10-CM | POA: Diagnosis not present

## 2017-02-18 DIAGNOSIS — M5441 Lumbago with sciatica, right side: Secondary | ICD-10-CM | POA: Diagnosis not present

## 2017-02-18 DIAGNOSIS — N529 Male erectile dysfunction, unspecified: Secondary | ICD-10-CM | POA: Diagnosis not present

## 2017-02-19 DIAGNOSIS — H02403 Unspecified ptosis of bilateral eyelids: Secondary | ICD-10-CM | POA: Diagnosis not present

## 2017-02-19 DIAGNOSIS — H2513 Age-related nuclear cataract, bilateral: Secondary | ICD-10-CM | POA: Diagnosis not present

## 2017-02-26 ENCOUNTER — Ambulatory Visit (INDEPENDENT_AMBULATORY_CARE_PROVIDER_SITE_OTHER): Payer: Medicare Other | Admitting: Pharmacist

## 2017-02-26 VITALS — BP 128/76 | HR 66

## 2017-02-26 DIAGNOSIS — I10 Essential (primary) hypertension: Secondary | ICD-10-CM | POA: Diagnosis not present

## 2017-02-26 DIAGNOSIS — E785 Hyperlipidemia, unspecified: Secondary | ICD-10-CM

## 2017-02-26 MED ORDER — VALSARTAN 160 MG PO TABS: 160.0000 mg | ORAL_TABLET | Freq: Two times a day (BID) | ORAL | 11 refills | Status: DC

## 2017-02-26 NOTE — Progress Notes (Signed)
Patient ID: Martin Rubio                 DOB: Sep 21, 1946                      MRN: [id number]     HPI:  Martin Rubio is a 71 y.o. male patient of Dr [name], who presents today for a lipid and hypertension follow up.  Last month he brought his home blood pressure cuff to the office and it read similar to the office cuff.  His valsartan dose was increased from 160mg  daily to 320mg  daily during last HTN follow up on 01/22/2017. BMET was done 01/22/17 and 01/27/17. Renal function stable with Scr=1.06.  His cardiac history is significant only for asymptomatic LVH.  His LDL cholesterol was 244 in early November, and a review of his chart shows that this is his baseline level.  He has been unable to tolerate high intensity statins due to pain in his joints and legs.  He does not recall having ever tried ezetimibe.  He also has rheumatoid arthritis, for which he previously used Embrel, but when he turned 72, the copay went up drastically and he was unable to afford.  He has not been able to tolerate any of the attempted statins long enough to determine if they would be effective. During his last office visit some swelling was reported and rosuvastatin low dose therapy was held for 2 weeks to r/o ADR.  Patient reports he didn't noticed any changes after stopping rosuvastatin for 2 weeks and decided to resume at previously tolerated dose  Patient denies increased swelling, headaches or fatigue.  Reports recurrent felling light-headed in the mornings when getting up but resolves by taking few extra seconds to get out of bed. No fall or syncope reported.     Current Medications:             Hyperlipidemia - rosuvastatin 10 mg once weekly              Hypertension - metoprolol succ 50 mg daily, valsartan 320 mg daily  Goals:             Hyperlipidemia - LDL < 100             Hypertension - BP < 130/80   Intolerant/previously tried:             Hyperlipidemia - rosuvastatin 20 mg daily, took x 2 weeks but  then had to discontinue due to back and leg pains (Nov 2017).  Then was given simvastatin 40 mg daily, took for about 10 days, the same symptoms returned (Dec 2017).  He had also tried pravastatin 20 mg back in 2015 with similar results.  Family history:              Mother had MI in her late 87's, stroke in mid 56's, father with MI at 42 (fatal);  1 brother with MI in his early 74's.  Diet:              Eats both at home and in restaurants.  Admits to liking pastas, breads, rice (he enjoys all [place] and [place] foods).   Eats eggs, cheese, bacon and chips regularly.  Exercise:               Nothing specific.  Belongs to [employer], but has not been in some time.  Labs:  10/2016 (in Care Everywhere) -  TC 326, TG 120,  HDL 58, LDL 244  Home BP readings:     22 readings; 145/89 average reading (1 reading elevated to 180/104 after patient unable to sleep)  ALL readings taken prior to taking blood pressure medication or immediately after taking the mediation.  Wt Readings from Last 3 Encounters:  01/27/17 214 lb (97.1 kg)  12/10/16 212 lb 9.6 oz (96.4 kg)  01/27/16 213 lb 8 oz (96.8 kg)   BP Readings from Last 3 Encounters:  02/26/17 128/76  01/27/17 150/97  01/22/17 (!) 146/84   Pulse Readings from Last 3 Encounters:  02/26/17 66  01/27/17 (!) 59  01/22/17 61    Past Medical History:  Diagnosis Date  . Bronchitis   . Diverticulosis   . FH: BPH (benign prostatic hypertrophy)   . HTN (hypertension)   . Hyperlipidemia   . Obesity   . Psoriatic arthritis ([place])   . Sleep apnea    CPAP  . Thyroid disease     Current Outpatient Prescriptions on File Prior to Visit  Medication Sig Dispense Refill  . alendronate (FOSAMAX) 70 MG tablet Take 70 mg by mouth once a week. Take with a full glass of water on an empty stomach.    . Certolizumab Pegol (CIMZIA [place]) Inject 1 mL into the skin every 6 (six) weeks.    Marland Kitchen levothyroxine (SYNTHROID, LEVOTHROID) 112 MCG tablet Take 112  mcg by mouth daily before breakfast.    . metoprolol succinate (TOPROL-XL) 50 MG 24 hr tablet TAKE 1 TABLET (50 MG TOTAL) BY MOUTH DAILY. TAKE WITH OR IMMEDIATELY FOLLOWING A MEAL. 90 tablet 1  . rosuvastatin (CRESTOR) 20 MG tablet Take 20 mg by mouth 3 (three) times a week.    . valsartan (DIOVAN) 320 MG tablet Take 1 tablet (320 mg total) by mouth daily. 30 tablet 6  . Cholecalciferol (VITAMIN D3) 1000 units CAPS Take 2 capsules by mouth daily.    . ferrous sulfate 325 (65 FE) MG tablet Take 325 mg by mouth daily with breakfast.    . Magnesium 400 MG CAPS Take by mouth.    . Magnesium Hydroxide 400 MG CHEW Chew 1 tablet by mouth daily.    . Misc Natural Products (JOINT HEALTH) CAPS Take 1-2 capsules by mouth daily.    . Multiple Minerals-Vitamins (CALCIUM & VIT D3 BONE HEALTH PO) Take 1 tablet by mouth daily.    . tadalafil (CIALIS) 5 MG tablet Take 5 mg by mouth daily as needed.     . vitamin C (ASCORBIC ACID) 500 MG tablet Take 500 mg by mouth daily.     No current facility-administered medications on file prior to visit.     Allergies  Allergen Reactions  . Codeine     Tension, nasty feeling   . Prednisone Swelling  . Statins     Joint pain  . Sulfa Antibiotics     Blood pressure 128/76, pulse 66, SpO2 97 %.  Essential hypertension:  Blood pressure today is at goal during office visit but noted elevated readings every morning prior to taking medication.  Patient also reports he is unable to swallow valsartan 320mg  pills without cutting them in 4 pieces.  Based on renal function and elevated morning reading we can start chlorthalidone 25mg  daily, but I have concerns about episodes of dizziness and BP at goal during the day.  Will change medication administration times as follow: metoprolol 50mg  at bedtime and valsartan 320mg  (160mg  every morning and 160mg  every with dinner). Patient will call  clinic in 2 weeks if systolic BP >076 in the mornings. Plan to start chlorthalidone at that  time if need.      Hyperlipidemia LDL goal <100: Patient is taking 1/2 of rosuvastatin 20 tablet every week. If he tolerates this, will increase to 20 mg weekly.  Plan repeat lipid panel after next office visit if patient able to continue therapy for an additional 4 weeks.  Significant amount of time spent discussing life style modifications like: decrease fat in diet and modifying eating habits.  Patient to work on eating less chips, cheese and bacon prior to the next appointment. Patient also encouraged to establish a goal to decrease 10% - 20% of current weight within a year.  Martin Rubio PharmD, [place] [id number] 02/26/2017 7:58 PM

## 2017-02-26 NOTE — Patient Instructions (Addendum)
Return for a  follow up appointment in 4 weeks  Your blood pressure today is 128/76 pulse 66  Check your blood pressure at home daily (if able) and keep record of the readings.  Take your BP meds as follows:   Hyperlipidemia - rosuvastatin 10 mg once weekly    Hypertension   metoprolol succ 50 mg daily  valsartan 160mg  twice a day (morning and afternoon)   **Okay to try melatonin 3mg  daily for 2 weeks for sleep aid** **Call clinic at [facility] or [facility] if BP > 150 to start chlorthalidone**  Bring all of your meds, your BP cuff and your record of home blood pressures to your next appointment.  Exercise as you're able, try to walk approximately 30 minutes per day.  Keep salt intake to a minimum, especially watch canned and prepared boxed foods.  Eat more fresh fruits and vegetables and fewer canned items.  Avoid eating in fast food restaurants.    HOW TO TAKE YOUR BLOOD PRESSURE: . Rest 5 minutes before taking your blood pressure. .  Don't smoke or drink caffeinated beverages for at least 30 minutes before. . Take your blood pressure before (not after) you eat. . Sit comfortably with your back supported and both feet on the floor (don't cross your legs). . Elevate your arm to heart level on a table or a desk. . Use the proper sized cuff. It should fit smoothly and snugly around your bare upper arm. There should be enough room to slip a fingertip under the cuff. The bottom edge of the cuff should be 1 inch above the crease of the elbow. . Ideally, take 3 measurements at one sitting and record the average.

## 2017-03-04 DIAGNOSIS — L4050 Arthropathic psoriasis, unspecified: Secondary | ICD-10-CM | POA: Diagnosis not present

## 2017-03-15 ENCOUNTER — Other Ambulatory Visit: Payer: Self-pay | Admitting: Pharmacist

## 2017-03-15 MED ORDER — VALSARTAN 160 MG PO TABS: 160.0000 mg | ORAL_TABLET | Freq: Two times a day (BID) | ORAL | 11 refills | Status: DC

## 2017-03-15 NOTE — Telephone Encounter (Signed)
Valsartan rx 160mg  twice daily; sent to prefer pharmacy

## 2017-04-01 DIAGNOSIS — L4050 Arthropathic psoriasis, unspecified: Secondary | ICD-10-CM | POA: Diagnosis not present

## 2017-04-05 ENCOUNTER — Ambulatory Visit (INDEPENDENT_AMBULATORY_CARE_PROVIDER_SITE_OTHER): Payer: Medicare Other | Admitting: Pharmacist

## 2017-04-05 VITALS — BP 118/78 | HR 58

## 2017-04-05 DIAGNOSIS — I10 Essential (primary) hypertension: Secondary | ICD-10-CM | POA: Diagnosis not present

## 2017-04-05 DIAGNOSIS — H02422 Myogenic ptosis of left eyelid: Secondary | ICD-10-CM | POA: Diagnosis not present

## 2017-04-05 DIAGNOSIS — Z09 Encounter for follow-up examination after completed treatment for conditions other than malignant neoplasm: Secondary | ICD-10-CM | POA: Diagnosis not present

## 2017-04-05 NOTE — Progress Notes (Signed)
Patient ID: Martin Rubio                 DOB: 07/02/1946                      MRN: [id number]     HPI:  Martin Rubio is a 71 y.o. male patient of Dr [name], who presents today hypertension and lipids follow up. His cardiac history is significant for asymptomatic LVH, hypercholesterolemia and rheumatoid arthritis.  During his most recent office visit his BP was at goal but patient reported few episodes of dizziness during the day. Administration times for his medication was changed to: metoprolol succinate 50mg  at bedtime and valsartan 320mg  (160mg  every morning and 160mg  every with dinner).  Rosuvastatin 10mg  weekly was continued for lipid management with intention to increase dose to 20 mg weekly if patient is able to tolerate low dose.  Patient denies swelling, headaches or fatigue.  Reports few episodes of dizziness and fatigue, but denies fall or syncope.   Current Medications: Hyperlipidemia - rosuvastatin 10 mg once weekly Hypertension - metoprolol succ 50 mg daily, valsartan 320 mg daily  Goals: Hyperlipidemia - LDL <100 Hypertension - BP <130/80   Intolerant/previously tried: Hyperlipidemia - rosuvastatin 20 mg daily, took x 2 weeks but then had to discontinue due to back and leg pains (Nov 2017). Then was given simvastatin 40 mg daily, took for about 10 days, the same symptoms returned (Dec 2017). He had also tried pravastatin 20 mg back in 2015 with similar results.  Family history: Mother had MI in her late 27's, stroke in mid 60's, father with MI at 25 (fatal); 1 brother with MI in his early 27's.  Diet: Eats both at home and in restaurants. Admits to liking pastas, breads, rice (he enjoys all [place] and [place] foods). Eats eggs, cheese, bacon and chips regularly.  Exercise: Nothing specific. Belongs to [employer], but has not been in some time.  Labs:    TC 326; TG 12;, HDL 58; LDL 244  (10/2016 per [facility] Everywhere)   Home BP readings:  20 readings; average 136/86 (range 117-158/73-97); pulse 33-68  Wt Readings from Last 3 Encounters:  01/27/17 214 lb (97.1 kg)  12/10/16 212 lb 9.6 oz (96.4 kg)  01/27/16 213 lb 8 oz (96.8 kg)   BP Readings from Last 3 Encounters:  04/05/17 118/78  02/26/17 128/76  01/27/17 150/97   Pulse Readings from Last 3 Encounters:  04/05/17 (!) 58  02/26/17 66  01/27/17 (!) 59    Past Medical History:  Diagnosis Date  . Bronchitis   . Diverticulosis   . FH: BPH (benign prostatic hypertrophy)   . HTN (hypertension)   . Hyperlipidemia   . Obesity   . Psoriatic arthritis ([place])   . Sleep apnea    CPAP  . Thyroid disease     Current Outpatient Prescriptions on File Prior to Visit  Medication Sig Dispense Refill  . alendronate (FOSAMAX) 70 MG tablet Take 70 mg by mouth once a week. Take with a full glass of water on an empty stomach.    . Certolizumab Pegol (CIMZIA [place]) Inject 1 mL into the skin every 6 (six) weeks.    . Cholecalciferol (VITAMIN D3) 1000 units CAPS Take 2 capsules by mouth daily.    . ferrous sulfate 325 (65 FE) MG tablet Take 325 mg by mouth daily with breakfast.    . levothyroxine (SYNTHROID, LEVOTHROID) 112 MCG tablet Take 112 mcg by mouth  daily before breakfast.    . Magnesium 400 MG CAPS Take by mouth.    . Magnesium Hydroxide 400 MG CHEW Chew 1 tablet by mouth daily.    . metoprolol succinate (TOPROL-XL) 50 MG 24 hr tablet TAKE 1 TABLET (50 MG TOTAL) BY MOUTH DAILY. TAKE WITH OR IMMEDIATELY FOLLOWING A MEAL. 90 tablet 1  . Misc Natural Products (JOINT HEALTH) CAPS Take 1-2 capsules by mouth daily.    . Multiple Minerals-Vitamins (CALCIUM & VIT D3 BONE HEALTH PO) Take 1 tablet by mouth daily.    . rosuvastatin (CRESTOR) 20 MG tablet Take 20 mg by mouth 3 (three) times a week.    . tadalafil (CIALIS) 5 MG tablet Take 5 mg by mouth daily as needed.     . valsartan (DIOVAN)  160 MG tablet Take 1 tablet (160 mg total) by mouth 2 (two) times daily. 60 tablet 11  . vitamin C (ASCORBIC ACID) 500 MG tablet Take 500 mg by mouth daily.     No current facility-administered medications on file prior to visit.     Allergies  Allergen Reactions  . Codeine     Tension, nasty feeling   . Prednisone Swelling  . Statins     Joint pain  . Sulfa Antibiotics     Blood pressure 118/78, pulse (!) 58.  Essential hypertension: Blood pressure today remains at desired goal of <130/80. Noted home BP reading also improved with average of 136/86.  Noted diastolic BP at home is slightly above goal but also noted few HR readings in 30s-40s. Episodes of dizziness and fatigue may be related to bradycardia. Will hold metoprolol x 3 days for a wash-out period, then resume at 25mg  daily. Patient to continue with valsartan 160mg  twice daily, and  follow up with HTN clinic in 4 weeks.  Hyperlipidemia: Patient tolerating rosuvastatin 10mg  weekly. Agreed to increase rosuvastatin to 10mg  twice weekly. If patient able to tolerate, plan to repeat LDL level in 8 weeks.  Gwyneth Fernandez Rodriguez-Guzman PharmD, [facility] [place] [address] 04/06/2017 11:21 AM

## 2017-04-05 NOTE — Patient Instructions (Addendum)
Return for a a follow up appointment in 4 weeks  Your blood pressure today is 118/78 pulse 58  Check your blood pressure at home daily (if able) and keep record of the readings.  Take your BP meds as follows: INCREASE rosuvastatin (Crestor) to 10mg  twice weekly **HOLD metoprolol succinate x 3, then resume at lower dose of 1/2 tablet (metoprolol succinate 25mg  daily)   Bring BP cuff and your record of home blood pressures to your next appointment.  Exercise as you're able, try to walk approximately 30 minutes per day.  Keep salt intake to a minimum, especially watch canned and prepared boxed foods.  Eat more fresh fruits and vegetables and fewer canned items.  Avoid eating in fast food restaurants.    HOW TO TAKE YOUR BLOOD PRESSURE: . Rest 5 minutes before taking your blood pressure. .  Don't smoke or drink caffeinated beverages for at least 30 minutes before. . Take your blood pressure before (not after) you eat. . Sit comfortably with your back supported and both feet on the floor (don't cross your legs). . Elevate your arm to heart level on a table or a desk. . Use the proper sized cuff. It should fit smoothly and snugly around your bare upper arm. There should be enough room to slip a fingertip under the cuff. The bottom edge of the cuff should be 1 inch above the crease of the elbow. . Ideally, take 3 measurements at one sitting and record the average.

## 2017-04-12 DIAGNOSIS — J309 Allergic rhinitis, unspecified: Secondary | ICD-10-CM | POA: Diagnosis not present

## 2017-04-12 DIAGNOSIS — J0100 Acute maxillary sinusitis, unspecified: Secondary | ICD-10-CM | POA: Diagnosis not present

## 2017-04-21 DIAGNOSIS — L4050 Arthropathic psoriasis, unspecified: Secondary | ICD-10-CM | POA: Diagnosis not present

## 2017-04-21 DIAGNOSIS — M150 Primary generalized (osteo)arthritis: Secondary | ICD-10-CM | POA: Diagnosis not present

## 2017-04-21 DIAGNOSIS — Z6830 Body mass index (BMI) 30.0-30.9, adult: Secondary | ICD-10-CM | POA: Diagnosis not present

## 2017-04-21 DIAGNOSIS — M545 Low back pain: Secondary | ICD-10-CM | POA: Diagnosis not present

## 2017-04-21 DIAGNOSIS — M79671 Pain in right foot: Secondary | ICD-10-CM | POA: Diagnosis not present

## 2017-04-21 DIAGNOSIS — L409 Psoriasis, unspecified: Secondary | ICD-10-CM | POA: Diagnosis not present

## 2017-04-29 DIAGNOSIS — L4050 Arthropathic psoriasis, unspecified: Secondary | ICD-10-CM | POA: Diagnosis not present

## 2017-05-04 ENCOUNTER — Other Ambulatory Visit: Payer: Self-pay | Admitting: Cardiology

## 2017-05-06 ENCOUNTER — Ambulatory Visit (INDEPENDENT_AMBULATORY_CARE_PROVIDER_SITE_OTHER): Payer: Medicare Other | Admitting: Pharmacist Clinician (PhC)/ Clinical Pharmacy Specialist

## 2017-05-06 DIAGNOSIS — I10 Essential (primary) hypertension: Secondary | ICD-10-CM | POA: Diagnosis not present

## 2017-05-06 DIAGNOSIS — E785 Hyperlipidemia, unspecified: Secondary | ICD-10-CM

## 2017-05-06 NOTE — Assessment & Plan Note (Signed)
Patient with BP still varied, but good in the office today.  I will have him discontinue the metoprolol due to bradycardia and suggested that he may need to increase the valsartan back to 160 mg over the next week should his BP rise.  He will continue with home BP monitoring, however I asked that he check his HR as well as BP anytime he notices dizziness in the next month.  He should keep a log to help determine if this is in fact due to systolic readings in the 944'C, bradycardia or maybe even his Orencia.

## 2017-05-06 NOTE — Patient Instructions (Signed)
Return for a a follow up appointment in July with Dr. [name]  Your blood pressure today is 118/72  Check your blood pressure at home daily and keep record of the readings.  Take your BP meds as follows:  Continue with valsartan 1/2 to 1 tablet twice daily depending on BP readings  Stop metoprolol for now.  Bring all of your meds, your BP cuff and your record of home blood pressures to your next appointment.  Exercise as you're able, try to walk approximately 30 minutes per day.  Keep salt intake to a minimum, especially watch canned and prepared boxed foods.  Eat more fresh fruits and vegetables and fewer canned items.  Avoid eating in fast food restaurants.    HOW TO TAKE YOUR BLOOD PRESSURE: . Rest 5 minutes before taking your blood pressure. .  Don't smoke or drink caffeinated beverages for at least 30 minutes before. . Take your blood pressure before (not after) you eat. . Sit comfortably with your back supported and both feet on the floor (don't cross your legs). . Elevate your arm to heart level on a table or a desk. . Use the proper sized cuff. It should fit smoothly and snugly around your bare upper arm. There should be enough room to slip a fingertip under the cuff. The bottom edge of the cuff should be 1 inch above the crease of the elbow. . Ideally, take 3 measurements at one sitting and record the average.

## 2017-05-06 NOTE — Progress Notes (Signed)
Patient ID: Arland Usery                 DOB: November 04, 1946                      MRN: [id number]     HPI:  Martin Rubio is a 71 y.o. male patient of Dr [name], who presents today hypertension and lipids follow up. His cardiac history is significant for asymptomatic LVH, hypercholesterolemia and rheumatoid arthritis.  At his last visit the metoprolol was cut back to 25 mg daily, due to some episodes of bradycardia.  Since then he has been doing some self dosing because of ongoing dizziness.  For the past 2-3 weeks he has only taken 80 mg of valsartan twice daily instead of 160.  Home BP readings are still quite varied.  He has assumed that the dizziness is associated with lower BP readings, however his lowest reading was **.  I suspect that the dizziness is more related to either the bradycardia or his RA medication Orencia.    Martin Rubio took rosuvastatin 5 mg once weekly, but stopped about 2-3 weeks ago because of joint pains in his knees.  He reports that he held the dose for about 2 weeks last month and then realized his joint pain was not as bad, but once he restarted the pains returned.    Current Medications: Hyperlipidemia - no medications Hypertension - metoprolol succ 25 mg daily, valsartan 80 mg bid  Goals: Hyperlipidemia - LDL <100 Hypertension - BP <130/80   Intolerant/previously tried: Hyperlipidemia - rosuvastatin 20 mg daily, took x 2 weeks but then had to discontinue due to back and leg pains (Nov 2017). Then was given simvastatin 40 mg daily, took for about 10 days, the same symptoms returned (Dec 2017). He had also tried pravastatin 20 mg back in 2015 with similar results.  Family history: Mother had MI/CABG in her late 12's, stroke in mid 77's, father with MI at 2 (fatal); 1 brother with MI in his early 27's.  Diet: Eats both at home and in restaurants. Admits to liking pastas,  breads, rice (he enjoys all [place] and [place] foods). Eats eggs, cheese, bacon and chips regularly.  Exercise: Nothing specific. Belongs to [employer], but has not been in some time.  Labs:   10/2016:  TC 326; TG 12;, HDL 58; LDL 244    Home BP readings:  23 readings; average 131/80 (range 110-162/62-108); pulse 33-72  Wt Readings from Last 3 Encounters:  01/27/17 214 lb (97.1 kg)  12/10/16 212 lb 9.6 oz (96.4 kg)  01/27/16 213 lb 8 oz (96.8 kg)   BP Readings from Last 3 Encounters:  04/05/17 118/78  02/26/17 128/76  01/27/17 150/97   Pulse Readings from Last 3 Encounters:  04/05/17 (!) 58  02/26/17 66  01/27/17 (!) 59    Past Medical History:  Diagnosis Date  . Bronchitis   . Diverticulosis   . FH: BPH (benign prostatic hypertrophy)   . HTN (hypertension)   . Hyperlipidemia   . Obesity   . Psoriatic arthritis ([place])   . Sleep apnea    CPAP  . Thyroid disease     Current Outpatient Prescriptions on File Prior to Visit  Medication Sig Dispense Refill  . alendronate (FOSAMAX) 70 MG tablet Take 70 mg by mouth once a week. Take with a full glass of water on an empty stomach.    . Certolizumab Pegol (CIMZIA [place]) Inject 1 mL  into the skin every 6 (six) weeks.    . Cholecalciferol (VITAMIN D3) 1000 units CAPS Take 2 capsules by mouth daily.    . ferrous sulfate 325 (65 FE) MG tablet Take 325 mg by mouth daily with breakfast.    . levothyroxine (SYNTHROID, LEVOTHROID) 112 MCG tablet Take 112 mcg by mouth daily before breakfast.    . Magnesium 400 MG CAPS Take by mouth.    . Magnesium Hydroxide 400 MG CHEW Chew 1 tablet by mouth daily.    . metoprolol succinate (TOPROL-XL) 50 MG 24 hr tablet TAKE 1 TABLET (50 MG TOTAL) BY MOUTH DAILY. TAKE WITH OR IMMEDIATELY FOLLOWING A MEAL. 90 tablet 1  . metoprolol succinate (TOPROL-XL) 50 MG 24 hr tablet TAKE 1 TABLET (50 MG TOTAL) BY MOUTH DAILY. TAKE WITH OR IMMEDIATELY FOLLOWING A MEAL. 30 tablet 6  . Misc  Natural Products (JOINT HEALTH) CAPS Take 1-2 capsules by mouth daily.    . Multiple Minerals-Vitamins (CALCIUM & VIT D3 BONE HEALTH PO) Take 1 tablet by mouth daily.    . rosuvastatin (CRESTOR) 20 MG tablet Take 20 mg by mouth 3 (three) times a week.    . tadalafil (CIALIS) 5 MG tablet Take 5 mg by mouth daily as needed.     . valsartan (DIOVAN) 160 MG tablet Take 1 tablet (160 mg total) by mouth 2 (two) times daily. 60 tablet 11  . vitamin C (ASCORBIC ACID) 500 MG tablet Take 500 mg by mouth daily.     No current facility-administered medications on file prior to visit.     Allergies  Allergen Reactions  . Codeine     Tension, nasty feeling   . Prednisone Swelling  . Statins     Joint pain  . Sulfa Antibiotics     There were no vitals taken for this visit.  Essential hypertension: Patient with BP still varied, but good in the office today.  I will have him discontinue the metoprolol due to bradycardia and suggested that he may need to increase the valsartan back to 160 mg over the next week should his BP rise.  He will continue with home BP monitoring, however I asked that he check his HR as well as BP anytime he notices dizziness in the next month.  He should keep a log to help determine if this is in fact due to systolic readings in the 453'M, bradycardia or maybe even his Orencia.    Hyperlipidemia: Patient tried and failed rosuvastatin 10 mg weekly then 5 mg weekly as well.  With baseline LDL at 244 and mother with early onset disease his [place] Lipid score is a 4.  Will have him repeat lipid labs in July (will do Cardio IQ) then see if we can get approval from his insurance.  Patient aware that medication may be cost prohibitive.   Martin Rubio PharmD CPP [facility] [address] [place] [address] 05/06/2017 8:30 AM

## 2017-05-06 NOTE — Assessment & Plan Note (Signed)
Patient tried and failed rosuvastatin 10 mg weekly then 5 mg weekly as well.  With baseline LDL at 244 and mother with early onset disease his [place] Lipid score is a 4.  Will have him repeat lipid labs in July (will do Cardio IQ) then see if we can get approval from his insurance.  Patient aware that medication may be cost prohibitive.

## 2017-05-07 ENCOUNTER — Encounter: Payer: Self-pay | Admitting: Pharmacist Clinician (PhC)/ Clinical Pharmacy Specialist

## 2017-05-17 ENCOUNTER — Telehealth: Payer: Self-pay | Admitting: Pharmacist

## 2017-05-17 DIAGNOSIS — Z719 Counseling, unspecified: Secondary | ICD-10-CM | POA: Diagnosis not present

## 2017-05-17 NOTE — Telephone Encounter (Signed)
Patient called. His blood pressure is going up and he took 1 dose of metoprolol yesterday.   Diastolic BP 82-883D with pulse 45bpm  Recommendation:  1. Stop metoprolol as previously discussed 2. Resume taking valsartan 160mg  twice daily 3. Continue to monitor BP and HR daily 4. Visit nearest Emergency room if vomiting/severe headache or BP >190systolic

## 2017-05-19 DIAGNOSIS — G603 Idiopathic progressive neuropathy: Secondary | ICD-10-CM | POA: Diagnosis not present

## 2017-05-19 DIAGNOSIS — R42 Dizziness and giddiness: Secondary | ICD-10-CM | POA: Diagnosis not present

## 2017-05-19 DIAGNOSIS — R202 Paresthesia of skin: Secondary | ICD-10-CM | POA: Diagnosis not present

## 2017-05-19 DIAGNOSIS — M5412 Radiculopathy, cervical region: Secondary | ICD-10-CM | POA: Diagnosis not present

## 2017-05-19 DIAGNOSIS — M5441 Lumbago with sciatica, right side: Secondary | ICD-10-CM | POA: Diagnosis not present

## 2017-05-19 DIAGNOSIS — M5442 Lumbago with sciatica, left side: Secondary | ICD-10-CM | POA: Diagnosis not present

## 2017-05-20 ENCOUNTER — Telehealth: Payer: Self-pay | Admitting: Pharmacist

## 2017-05-20 DIAGNOSIS — R55 Syncope and collapse: Secondary | ICD-10-CM
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
## 2017-05-20 NOTE — Telephone Encounter (Signed)
OK to order Carotid Doppler for presyncope.

## 2017-05-21 NOTE — Telephone Encounter (Signed)
Carotid doppler ordered and send to schedule to call pt and schedule appt.

## 2017-05-22 DIAGNOSIS — J0100 Acute maxillary sinusitis, unspecified: Secondary | ICD-10-CM | POA: Diagnosis not present

## 2017-05-22 DIAGNOSIS — J40 Bronchitis, not specified as acute or chronic: Secondary | ICD-10-CM | POA: Diagnosis not present

## 2017-05-24 ENCOUNTER — Ambulatory Visit (HOSPITAL_COMMUNITY)
Admission: RE | Admit: 2017-05-24 | Discharge: 2017-05-24 | Disposition: A | Discharge status: Home / Self Care | Payer: Medicare Other | Source: Ambulatory Visit | Attending: Cardiovascular Disease | Admitting: Cardiovascular Disease

## 2017-05-24 DIAGNOSIS — I6522 Occlusion and stenosis of left carotid artery: Secondary | ICD-10-CM | POA: Diagnosis not present

## 2017-05-24 DIAGNOSIS — R55 Syncope and collapse: Secondary | ICD-10-CM | POA: Diagnosis not present

## 2017-05-27 ENCOUNTER — Telehealth: Payer: Self-pay | Admitting: Cardiology

## 2017-05-27 NOTE — Telephone Encounter (Signed)
New message    Pt is calling about his test results.

## 2017-05-27 NOTE — Telephone Encounter (Signed)
Spoke with pt, aware of preliminary report is normal.

## 2017-06-03 ENCOUNTER — Telehealth: Payer: Self-pay | Admitting: Pharmacist

## 2017-06-03 NOTE — Telephone Encounter (Signed)
Patient called to report severe dizziness with valsartan and will like to stop but his evening blood pressure is increasing and he continue to take valsartan PRN.  Noted history of metoprolol succinate 50mg  daily in the past and amlodipine 5mg  daily as well.  Patient stated he was not as dizzy when taking metoprolol and cannot remember ADR to amlodipine either.   Recommendation: 1. STOP taking valsartan (last doe this morning) 2. Resume metoprolol succinate 50mg  daily and keep log with BP and HR readings; call clinic if heart rate 50 or lower. 3. Plan to initiate amlodipine 2.5mg  daily if patient unable to tolerate metoprolol.  *Schedule f/u with Dr [name] on 07/12/2017. Patient unable to come to HTN clinic from 7/12 to 7/23*

## 2017-06-11 DIAGNOSIS — H02422 Myogenic ptosis of left eyelid: Secondary | ICD-10-CM | POA: Diagnosis not present

## 2017-06-14 NOTE — Progress Notes (Signed)
HPI The patient returns for follow up of HTN.  His last echo in Nov of 2016 demonstrated no septal hypertrophy.   He was hypertensive on metoprolol which was stopped.  He called recently and he was having dizziness with Valsartan.  He self medicates and he stopped his metoprolol. He takes his valsartan one half when necessary and this seems to be slightly random although they're usually take it if his diastolic is close to 779. He says he actually hasn't needed any in a few days.  He thinks that these medications causes dizziness which has been a continued problem.  Other issues have been his intolerance to statins. We'll trying to get PCSK9 paid for.  He doesn't have any acute problem such as shortness of breath, PND or orthopnea. He does have neuropathy and back problems. The dizziness is not with any syncope or presyncope. It seems to be sporadic.  Allergies  Allergen Reactions  . Codeine     Tension, nasty feeling   . Prednisone Swelling  . Statins     Joint pain  . Sulfa Antibiotics     Current Outpatient Prescriptions  Medication Sig Dispense Refill  . alendronate (FOSAMAX) 70 MG tablet Take 70 mg by mouth once a week. Take with a full glass of water on an empty stomach.    . Certolizumab Pegol (CIMZIA [place]) Inject 1 mL into the skin every 6 (six) weeks.    . Cholecalciferol (VITAMIN D3) 1000 units CAPS Take 3 capsules by mouth daily.     Marland Kitchen levothyroxine (SYNTHROID, LEVOTHROID) 112 MCG tablet Take 112 mcg by mouth daily before breakfast.    . Magnesium Hydroxide 400 MG CHEW Chew 1 tablet by mouth daily.    . Misc Natural Products (JOINT HEALTH) CAPS Take 1-2 capsules by mouth daily.    . Multiple Minerals-Vitamins (CALCIUM & VIT D3 BONE HEALTH PO) Take 1 tablet by mouth daily.    . vitamin C (ASCORBIC ACID) 500 MG tablet Take 1,000 mg by mouth daily.     Marland Kitchen ezetimibe (ZETIA) 10 MG tablet Take 1 tablet (10 mg total) by mouth daily. 90 tablet 3  . metoprolol succinate (TOPROL-XL) 50 MG  24 hr tablet TAKE 1 TABLET (50 MG TOTAL) BY MOUTH DAILY. TAKE WITH OR IMMEDIATELY FOLLOWING A MEAL. (Patient not taking: Reported on 06/15/2017) 30 tablet 6   No current facility-administered medications for this visit.     Past Medical History:  Diagnosis Date  . Bronchitis   . Diverticulosis   . FH: BPH (benign prostatic hypertrophy)   . HTN (hypertension)   . Hyperlipidemia   . Obesity   . Psoriatic arthritis ([place])   . Sleep apnea    CPAP  . Thyroid disease     Past Surgical History:  Procedure Laterality Date  . INGUINAL HERNIA REPAIR     bilateral  . KNEE SURGERY     bilateral arthroscopic  . TRANSURETHRAL RESECTION OF PROSTATE    . UMBILICAL HERNIA REPAIR     ROS:    Otherwise as stated in the HPI and negative for all other systems.  PHYSICAL EXAM BP 136/84   Pulse (!) 58   Ht 5\' 10"  (1.778 m)   Wt 219 lb (99.3 kg)   BMI 31.42 kg/m   GENERAL:  Well appearing NECK:  No jugular venous distention, waveform within normal limits, carotid upstroke brisk and symmetric, no bruits, no thyromegaly LUNGS:  Clear to auscultation bilaterally CHEST:  Unremarkable HEART:  PMI not displaced or sustained,S1 and S2 within normal limits, no S3, no S4, no clicks, no rubs, 2 out of 6 apical systolic murmur increasing with a strain phase of Valsalva, no diastolic murmurs ABD:  Flat, positive bowel sounds normal in frequency in pitch, no bruits, no rebound, no guarding, no midline pulsatile mass, no hepatomegaly, no splenomegaly, well healed surgical scar EXT:  2 plus pulses throughout, no edema, no cyanosis no clubbing    ASSESSMENT AND PLAN   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 He has a 50% stenosis, iliac arteries. I will arrange follow-up for October of this year.  HTN Since difficult to manage because his symptoms and the fact that he self medicates. I'm going to apply a 24-hour blood pressure cuff to try to  understand what his readings are he'll be off of his medications during that 24 hours since he is not taking them for the most part anyway.  Septal hypertrophy This was very mild previously. I'm going to follow this up is in a couple of years.  Dyslipidemia He was in the Lipid Clinic and was intolerant of multiple statins.  I contacted our lipid clinic to see what we are with potentially getting him PCSK9.  I am going to start Zetia 10 mg daily.  Obesity He ate at [facility] today.  I have talked to him about diet and extercise.

## 2017-06-15 ENCOUNTER — Encounter: Payer: Self-pay | Admitting: Cardiology

## 2017-06-15 ENCOUNTER — Ambulatory Visit (INDEPENDENT_AMBULATORY_CARE_PROVIDER_SITE_OTHER): Payer: Medicare Other | Admitting: Cardiology

## 2017-06-15 VITALS — BP 136/84 | HR 58 | Ht 70.0 in | Wt 219.0 lb

## 2017-06-15 DIAGNOSIS — I422 Other hypertrophic cardiomyopathy: Secondary | ICD-10-CM

## 2017-06-15 DIAGNOSIS — L4050 Arthropathic psoriasis, unspecified: Secondary | ICD-10-CM | POA: Diagnosis not present

## 2017-06-15 DIAGNOSIS — E785 Hyperlipidemia, unspecified: Secondary | ICD-10-CM

## 2017-06-15 DIAGNOSIS — I723 Aneurysm of iliac artery: Secondary | ICD-10-CM

## 2017-06-15 DIAGNOSIS — I10 Essential (primary) hypertension: Secondary | ICD-10-CM | POA: Diagnosis not present

## 2017-06-15 DIAGNOSIS — I517 Cardiomegaly: Secondary | ICD-10-CM

## 2017-06-15 MED ORDER — EZETIMIBE 10 MG PO TABS: 10.0000 mg | ORAL_TABLET | Freq: Every day | ORAL | 3 refills | Status: DC

## 2017-06-15 NOTE — Patient Instructions (Signed)
Medication Instructions:  START- Zetia 10 mg daily  Labwork: Fasting Lipids in 8 Weeks  Testing/Procedures: Your physician has requested that you have an echocardiogram. Echocardiography is a painless test that uses sound waves to create images of your heart. It provides your doctor with information about the size and shape of your heart and how well your heart's chambers and valves are working. This procedure takes approximately one hour. There are no restrictions for this procedure.  Your physician has recommended that you wear a 24 hour blood pressure monitor. This is put on at our [facility].  Follow-Up: Your physician recommends that you schedule a follow-up appointment in: After Test.   Any Other Special Instructions Will Be Listed Below (If Applicable).   If you need a refill on your cardiac medications before your next appointment, please call your pharmacy.

## 2017-06-16 ENCOUNTER — Encounter: Payer: Self-pay | Admitting: Cardiology

## 2017-06-16 DIAGNOSIS — I723 Aneurysm of iliac artery: Secondary | ICD-10-CM | POA: Insufficient documentation

## 2017-06-16 DIAGNOSIS — I422 Other hypertrophic cardiomyopathy: Secondary | ICD-10-CM | POA: Insufficient documentation

## 2017-07-06 ENCOUNTER — Ambulatory Visit (HOSPITAL_COMMUNITY): Payer: Medicare Other | Attending: Cardiology

## 2017-07-06 ENCOUNTER — Encounter: Payer: Self-pay | Admitting: *Deleted

## 2017-07-06 ENCOUNTER — Other Ambulatory Visit: Payer: Self-pay

## 2017-07-06 ENCOUNTER — Ambulatory Visit (INDEPENDENT_AMBULATORY_CARE_PROVIDER_SITE_OTHER): Payer: Medicare Other

## 2017-07-06 DIAGNOSIS — I10 Essential (primary) hypertension: Secondary | ICD-10-CM

## 2017-07-06 DIAGNOSIS — Z87891 Personal history of nicotine dependence: Secondary | ICD-10-CM | POA: Insufficient documentation

## 2017-07-06 DIAGNOSIS — I517 Cardiomegaly: Secondary | ICD-10-CM | POA: Diagnosis not present

## 2017-07-06 DIAGNOSIS — I422 Other hypertrophic cardiomyopathy: Secondary | ICD-10-CM | POA: Diagnosis not present

## 2017-07-06 DIAGNOSIS — Z6831 Body mass index (BMI) 31.0-31.9, adult: Secondary | ICD-10-CM | POA: Insufficient documentation

## 2017-07-06 DIAGNOSIS — G4733 Obstructive sleep apnea (adult) (pediatric): Secondary | ICD-10-CM | POA: Insufficient documentation

## 2017-07-06 DIAGNOSIS — Z8249 Family history of ischemic heart disease and other diseases of the circulatory system: Secondary | ICD-10-CM | POA: Insufficient documentation

## 2017-07-06 DIAGNOSIS — E785 Hyperlipidemia, unspecified: Secondary | ICD-10-CM | POA: Insufficient documentation

## 2017-07-06 DIAGNOSIS — E669 Obesity, unspecified: Secondary | ICD-10-CM | POA: Insufficient documentation

## 2017-07-06 DIAGNOSIS — I119 Hypertensive heart disease without heart failure: Secondary | ICD-10-CM | POA: Diagnosis not present

## 2017-07-06 NOTE — Progress Notes (Signed)
Patient ID: Martin Rubio, male   DOB: Dec 10, 1945, 71 y.o.   MRN: [id number] 24 hour ambulatory blood pressure monitor applied to patient using standard adult cuff.

## 2017-07-08 ENCOUNTER — Other Ambulatory Visit: Payer: Self-pay | Admitting: Pharmacist Clinician (PhC)/ Clinical Pharmacy Specialist

## 2017-07-08 DIAGNOSIS — E785 Hyperlipidemia, unspecified: Secondary | ICD-10-CM | POA: Diagnosis not present

## 2017-07-08 LAB — LIPID PANEL
Chol/HDL Ratio: 6.9 ratio — ABNORMAL HIGH (ref 0.0–5.0)
Cholesterol, Total: 274 mg/dL — ABNORMAL HIGH (ref 100–199)
HDL: 40 mg/dL (ref >39)
LDL Calculated: 194 mg/dL — ABNORMAL HIGH (ref 0–99)
Triglycerides: 198 mg/dL — ABNORMAL HIGH (ref 0–149)
VLDL Cholesterol Cal: 40 mg/dL (ref 5–40)

## 2017-07-08 MED ORDER — EVOLOCUMAB 140 MG/ML ~~LOC~~ SOAJ: 140.0000 mg | SUBCUTANEOUS | 12 refills | Status: DC

## 2017-07-12 ENCOUNTER — Ambulatory Visit: Payer: Medicare Other | Admitting: Cardiology

## 2017-07-13 DIAGNOSIS — L4050 Arthropathic psoriasis, unspecified: Secondary | ICD-10-CM | POA: Diagnosis not present

## 2017-07-13 LAB — LIPID PANEL

## 2017-07-14 DIAGNOSIS — G5603 Carpal tunnel syndrome, bilateral upper limbs: Secondary | ICD-10-CM | POA: Diagnosis not present

## 2017-07-14 DIAGNOSIS — M5417 Radiculopathy, lumbosacral region: Secondary | ICD-10-CM | POA: Diagnosis not present

## 2017-07-14 DIAGNOSIS — M5412 Radiculopathy, cervical region: Secondary | ICD-10-CM | POA: Diagnosis not present

## 2017-07-14 DIAGNOSIS — N400 Enlarged prostate without lower urinary tract symptoms: Secondary | ICD-10-CM | POA: Diagnosis not present

## 2017-07-14 DIAGNOSIS — R202 Paresthesia of skin: Secondary | ICD-10-CM | POA: Diagnosis not present

## 2017-07-14 DIAGNOSIS — N529 Male erectile dysfunction, unspecified: Secondary | ICD-10-CM | POA: Diagnosis not present

## 2017-07-14 DIAGNOSIS — G603 Idiopathic progressive neuropathy: Secondary | ICD-10-CM | POA: Diagnosis not present

## 2017-07-15 ENCOUNTER — Telehealth: Payer: Self-pay | Admitting: Cardiology

## 2017-07-15 NOTE — Telephone Encounter (Signed)
Called back Dr. Quentin Cornwall - he is with the medicare Part D plan. Says that Repatha is not formulary and would recommend Praluaent. I don't see an issue with that. Will still need prior authorization.  Dr. Debara Pickett

## 2017-07-15 NOTE — Telephone Encounter (Signed)
Noted, will re-send as Praluent

## 2017-07-15 NOTE — Telephone Encounter (Signed)
New message  Dr. Walker Kehr call requesting to speak with Dr. [name] or DOD. He states this is a time sensitive matter. Please call back to discuss as soon as possible

## 2017-07-19 ENCOUNTER — Telehealth: Payer: Self-pay | Admitting: Pharmacist Clinician (PhC)/ Clinical Pharmacy Specialist

## 2017-07-19 MED ORDER — ALIROCUMAB 150 MG/ML ~~LOC~~ SOPN: 150.0000 mg | PEN_INJECTOR | SUBCUTANEOUS | 12 refills | Status: DC

## 2017-07-19 NOTE — Telephone Encounter (Signed)
Prescription sent to [facility] for Praluent 150 mg.  Pt to call if copay is cost prohibitive.

## 2017-07-22 DIAGNOSIS — Z6831 Body mass index (BMI) 31.0-31.9, adult: Secondary | ICD-10-CM | POA: Diagnosis not present

## 2017-07-22 DIAGNOSIS — M79671 Pain in right foot: Secondary | ICD-10-CM | POA: Diagnosis not present

## 2017-07-22 DIAGNOSIS — M545 Low back pain: Secondary | ICD-10-CM | POA: Diagnosis not present

## 2017-07-22 DIAGNOSIS — E669 Obesity, unspecified: Secondary | ICD-10-CM | POA: Diagnosis not present

## 2017-07-22 DIAGNOSIS — L409 Psoriasis, unspecified: Secondary | ICD-10-CM | POA: Diagnosis not present

## 2017-07-22 DIAGNOSIS — M150 Primary generalized (osteo)arthritis: Secondary | ICD-10-CM | POA: Diagnosis not present

## 2017-07-22 DIAGNOSIS — L4050 Arthropathic psoriasis, unspecified: Secondary | ICD-10-CM | POA: Diagnosis not present

## 2017-07-25 NOTE — Progress Notes (Signed)
HPI The patient returns for follow up of HTN.  His last echo in Nov of 2016 demonstrated no septal hypertrophy.  He has had problems with BP and with self medication secondary to medication tolerance issues.  At the last visit I had him wear a 24 hour BP monitor and has BP was not well controlled.  He returns to discuss this.  He reports his blood pressure when he takes it at home is usually well controlled. It's particularly well controlled when he takes Viagra and he says that he does this occasionally reports intended purpose. Afterwards his blood pressure is well-controlled for couple of days. If his blood pressure starts to creep up he'll take a quarter of a valsartan. He has neuropathy and back problems. I don't think he's particularly active. He has had dizziness that he now understands isn't related to the valsartan because it happens when he doesn't take this medication.   Allergies  Allergen Reactions  . Codeine     Tension, nasty feeling   . Prednisone Swelling  . Statins     Joint pain  . Sulfa Antibiotics     Current Outpatient Prescriptions  Medication Sig Dispense Refill  . alendronate (FOSAMAX) 70 MG tablet Take 70 mg by mouth once a week. Take with a full glass of water on an empty stomach.    . Cholecalciferol (VITAMIN D3) 1000 units CAPS Take 3 capsules by mouth daily.     Marland Kitchen ezetimibe (ZETIA) 10 MG tablet Take 1 tablet (10 mg total) by mouth daily. 90 tablet 3  . levothyroxine (SYNTHROID, LEVOTHROID) 112 MCG tablet Take 112 mcg by mouth daily before breakfast.    . Magnesium Hydroxide 400 MG CHEW Chew 1 tablet by mouth daily.    . Misc Natural Products (JOINT HEALTH) CAPS Take 1-2 capsules by mouth daily.    . Multiple Minerals-Vitamins (CALCIUM & VIT D3 BONE HEALTH PO) Take 1 tablet by mouth daily.    . vitamin C (ASCORBIC ACID) 500 MG tablet Take 1,000 mg by mouth daily.     . Alirocumab (PRALUENT) 150 MG/ML SOPN Inject 150 mg into the skin every 14 (fourteen) days.  2 pen 11   No current facility-administered medications for this visit.     Past Medical History:  Diagnosis Date  . Bronchitis   . Diverticulosis   . FH: BPH (benign prostatic hypertrophy)   . HTN (hypertension)   . Hyperlipidemia   . Obesity   . Psoriatic arthritis ([place])   . Sleep apnea    CPAP  . Thyroid disease     Past Surgical History:  Procedure Laterality Date  . INGUINAL HERNIA REPAIR     bilateral  . KNEE SURGERY     bilateral arthroscopic  . TRANSURETHRAL RESECTION OF PROSTATE    . UMBILICAL HERNIA REPAIR     ROS:   As stated in the HPI and negative for all other systems. 2 out of 6 apical systolic murmur increasing slightly with the strain phase of Valsalva, no diastolic  PHYSICAL EXAM BP 123/80   Pulse 67   Ht 5\' 10"  (1.778 m)   Wt 223 lb (101.2 kg)   BMI 32.00 kg/m   GENERAL:  Well appearing NECK:  No jugular venous distention, waveform within normal limits, carotid upstroke brisk and symmetric, no bruits, no thyromegaly LUNGS:  Clear to auscultation bilaterally BACK:  No CVA tenderness CHEST:  Unremarkable HEART:  PMI not displaced or sustained,S1 and S2 within normal limits,  no S3, no S4, no clicks, no rubs, no murmurs ABD:  Flat, positive bowel sounds normal in frequency in pitch, no bruits, no rebound, no guarding, no midline pulsatile mass, no hepatomegaly, no splenomegaly EXT:  2 plus pulses throughout, no edema, no cyanosis no clubbing   EKG:  NA   ASSESSMENT AND PLAN   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 He has a 50% stenosis, iliac arteries. He has had no AAA.  He is overdue for follow up of the iliacs.  I will arrange follow-up.   HTN He had poor control on the 24 hour monitor.  He has not been compliant with the meds.  He's going to keep his blood pressure 5 times a day for the next 2 weeks and I'll determine whether he needs to be back on schedule ARB or not. At this point I  believe his blood pressure is not well controlled per the 24-hour monitor but I'm getting conflicting data from his home readings. He is absolutely convinced his blood pressure cuff is accurate. I reviewed the blood pressure diary with him in the office today showing and the tracings.  Septal hypertrophy This was very mild previously.  No further imaging is indicated at this point. I'll probably repeat this next year.  Dyslipidemia He is being managed in the lipid clinic with PCSK9 inhibitor.  He's having trouble getting this pain before. The pharmacist came in today to confirm with him and to talk about whether his valsartan is on recall.  Obesity The patient is asked to make an attempt to improve diet and exercise patterns to aid in medical management of this problem.

## 2017-07-26 ENCOUNTER — Ambulatory Visit (INDEPENDENT_AMBULATORY_CARE_PROVIDER_SITE_OTHER): Payer: Medicare Other | Admitting: Cardiology

## 2017-07-26 ENCOUNTER — Other Ambulatory Visit: Payer: Self-pay | Admitting: Pharmacist

## 2017-07-26 VITALS — BP 123/80 | HR 67 | Ht 70.0 in | Wt 223.0 lb

## 2017-07-26 DIAGNOSIS — I723 Aneurysm of iliac artery: Secondary | ICD-10-CM

## 2017-07-26 DIAGNOSIS — I422 Other hypertrophic cardiomyopathy: Secondary | ICD-10-CM | POA: Diagnosis not present

## 2017-07-26 DIAGNOSIS — I10 Essential (primary) hypertension: Secondary | ICD-10-CM | POA: Diagnosis not present

## 2017-07-26 MED ORDER — ALIROCUMAB 150 MG/ML ~~LOC~~ SOPN: 150.0000 mg | PEN_INJECTOR | SUBCUTANEOUS | 11 refills | Status: DC

## 2017-07-26 NOTE — Patient Instructions (Addendum)
Medication Instructions:  Continue current medications  If you need a refill on your cardiac medications before your next appointment, please call your pharmacy.  Labwork: None Ordered   Testing/Procedures: None Ordered  Follow-Up: Your physician wants you to follow-up in: 6 Months. You should receive a reminder letter in the mail two months in advance. If you do not receive a letter, please call our office [phone].    Thank you for choosing [facility] at [facility]!!

## 2017-07-27 ENCOUNTER — Encounter: Payer: Self-pay | Admitting: Cardiology

## 2017-08-10 DIAGNOSIS — L4050 Arthropathic psoriasis, unspecified: Secondary | ICD-10-CM | POA: Diagnosis not present

## 2017-08-10 DIAGNOSIS — Z79899 Other long term (current) drug therapy: Secondary | ICD-10-CM | POA: Diagnosis not present

## 2017-08-13 DIAGNOSIS — G4733 Obstructive sleep apnea (adult) (pediatric): Secondary | ICD-10-CM | POA: Diagnosis not present

## 2017-08-13 DIAGNOSIS — E669 Obesity, unspecified: Secondary | ICD-10-CM | POA: Insufficient documentation

## 2017-08-13 DIAGNOSIS — Z23 Encounter for immunization: Secondary | ICD-10-CM | POA: Diagnosis not present

## 2017-08-19 DIAGNOSIS — R201 Hypoesthesia of skin: Secondary | ICD-10-CM | POA: Diagnosis not present

## 2017-08-19 DIAGNOSIS — M5441 Lumbago with sciatica, right side: Secondary | ICD-10-CM | POA: Diagnosis not present

## 2017-08-19 DIAGNOSIS — M5442 Lumbago with sciatica, left side: Secondary | ICD-10-CM | POA: Diagnosis not present

## 2017-08-19 DIAGNOSIS — R202 Paresthesia of skin: Secondary | ICD-10-CM | POA: Diagnosis not present

## 2017-09-07 DIAGNOSIS — Z79899 Other long term (current) drug therapy: Secondary | ICD-10-CM | POA: Diagnosis not present

## 2017-09-07 DIAGNOSIS — L4050 Arthropathic psoriasis, unspecified: Secondary | ICD-10-CM | POA: Diagnosis not present

## 2017-09-15 ENCOUNTER — Ambulatory Visit (HOSPITAL_COMMUNITY)
Admission: RE | Admit: 2017-09-15 | Discharge: 2017-09-15 | Disposition: A | Discharge status: Home / Self Care | Payer: Medicare Other | Source: Ambulatory Visit | Attending: Internal Medicine | Admitting: Internal Medicine

## 2017-09-15 DIAGNOSIS — I714 Abdominal aortic aneurysm, without rupture: Secondary | ICD-10-CM | POA: Diagnosis not present

## 2017-09-15 DIAGNOSIS — I723 Aneurysm of iliac artery: Secondary | ICD-10-CM | POA: Insufficient documentation

## 2017-09-21 ENCOUNTER — Telehealth: Payer: Self-pay | Admitting: *Deleted

## 2017-09-21 MED ORDER — VALSARTAN 80 MG PO TABS: 80.0000 mg | ORAL_TABLET | Freq: Every day | ORAL | 3 refills | Status: DC

## 2017-09-21 NOTE — Telephone Encounter (Signed)
Pt stated that he is taking Valsartan 80 mg, he take 80 mg if his blood pressure is over 106 systolic and he will take 1/2 of 80 mg if his BP is at 150 and he will not take any medication if his blood pressure is under 140. Spoke with Dr [name] about this and Dr [name] recommended that pt take Valsartan 80 mg daily. Rx has been sent to the pharmacy electronically. Valsartan 80 mg # 90 3 refills [facility].

## 2017-10-01 ENCOUNTER — Other Ambulatory Visit: Payer: Self-pay | Admitting: *Deleted

## 2017-10-01 DIAGNOSIS — I714 Abdominal aortic aneurysm, without rupture, unspecified: Secondary | ICD-10-CM

## 2017-10-05 DIAGNOSIS — L4050 Arthropathic psoriasis, unspecified: Secondary | ICD-10-CM | POA: Diagnosis not present

## 2017-10-21 DIAGNOSIS — N529 Male erectile dysfunction, unspecified: Secondary | ICD-10-CM | POA: Diagnosis not present

## 2017-10-21 DIAGNOSIS — N401 Enlarged prostate with lower urinary tract symptoms: Secondary | ICD-10-CM | POA: Diagnosis not present

## 2017-10-21 DIAGNOSIS — N138 Other obstructive and reflux uropathy: Secondary | ICD-10-CM | POA: Diagnosis not present

## 2017-10-25 ENCOUNTER — Telehealth: Payer: Self-pay | Admitting: Cardiology

## 2017-10-25 DIAGNOSIS — E785 Hyperlipidemia, unspecified: Secondary | ICD-10-CM

## 2017-10-25 NOTE — Telephone Encounter (Signed)
Patient due to repeat lipid panel within next 1-2 weeks. Order in [place], okay to stop by clinic and get blood fasting blood work any day between 11/20 and 11/30.

## 2017-10-25 NOTE — Telephone Encounter (Signed)
Left message to call back  

## 2017-10-25 NOTE — Telephone Encounter (Signed)
Martin Rubio is calling because he is taking Praluent 150 mg for his Cholesterol wants to know if he should have labs to find out if he needs to continue with the medication .

## 2017-10-25 NOTE — Telephone Encounter (Signed)
Returned the call to the patient. He stated that he currenty has had five injections of Praluent and has one left. He would like to know if he should come in for repeat labs to check his fasting lipids. He would like to be finished with the Praluent. Will route to pharmd who is following the patient.

## 2017-11-01 ENCOUNTER — Other Ambulatory Visit: Payer: Self-pay | Admitting: *Deleted

## 2017-11-01 DIAGNOSIS — L409 Psoriasis, unspecified: Secondary | ICD-10-CM | POA: Diagnosis not present

## 2017-11-01 DIAGNOSIS — E785 Hyperlipidemia, unspecified: Secondary | ICD-10-CM | POA: Diagnosis not present

## 2017-11-01 DIAGNOSIS — M150 Primary generalized (osteo)arthritis: Secondary | ICD-10-CM | POA: Diagnosis not present

## 2017-11-01 DIAGNOSIS — E669 Obesity, unspecified: Secondary | ICD-10-CM | POA: Diagnosis not present

## 2017-11-01 DIAGNOSIS — L4050 Arthropathic psoriasis, unspecified: Secondary | ICD-10-CM | POA: Diagnosis not present

## 2017-11-01 DIAGNOSIS — Z6832 Body mass index (BMI) 32.0-32.9, adult: Secondary | ICD-10-CM | POA: Diagnosis not present

## 2017-11-01 DIAGNOSIS — M79671 Pain in right foot: Secondary | ICD-10-CM | POA: Diagnosis not present

## 2017-11-01 DIAGNOSIS — M545 Low back pain: Secondary | ICD-10-CM | POA: Diagnosis not present

## 2017-11-01 LAB — LIPID PANEL
Chol/HDL Ratio: 3.1 ratio (ref 0.0–5.0)
Cholesterol, Total: 140 mg/dL (ref 100–199)
HDL: 45 mg/dL (ref >39)
LDL Calculated: 63 mg/dL (ref 0–99)
Triglycerides: 161 mg/dL — ABNORMAL HIGH (ref 0–149)
VLDL Cholesterol Cal: 32 mg/dL (ref 5–40)

## 2017-11-01 LAB — HEPATIC FUNCTION PANEL
ALBUMIN: 4.1 g/dL (ref 3.5–4.8)
ALK PHOS: 54 IU/L (ref 39–117)
ALT: 20 IU/L (ref 0–44)
AST: 25 IU/L (ref 0–40)
BILIRUBIN, DIRECT: 0.15 mg/dL (ref 0.00–0.40)
Bilirubin Total: 0.5 mg/dL (ref 0.0–1.2)
TOTAL PROTEIN: 6.3 g/dL (ref 6.0–8.5)

## 2017-11-02 DIAGNOSIS — L4050 Arthropathic psoriasis, unspecified: Secondary | ICD-10-CM | POA: Diagnosis not present

## 2017-11-02 NOTE — Telephone Encounter (Signed)
Follow up     Pt is calling to get his blood work results

## 2017-11-02 NOTE — Telephone Encounter (Signed)
Notes recorded by Minus Breeding, MD on 11/02/2017 at 12:57 PM EST Labs OK.  Continue current therapy. Call Mr. Kessen with the results and send results to Libby Maw, MD Results sent to PCP via Epic  Pt notified he will call back to schedule appt with [facility]

## 2017-11-22 DIAGNOSIS — M71572 Other bursitis, not elsewhere classified, left ankle and foot: Secondary | ICD-10-CM | POA: Diagnosis not present

## 2017-11-22 DIAGNOSIS — M76822 Posterior tibial tendinitis, left leg: Secondary | ICD-10-CM | POA: Diagnosis not present

## 2017-11-22 DIAGNOSIS — M19072 Primary osteoarthritis, left ankle and foot: Secondary | ICD-10-CM | POA: Diagnosis not present

## 2017-11-26 DIAGNOSIS — M71572 Other bursitis, not elsewhere classified, left ankle and foot: Secondary | ICD-10-CM | POA: Diagnosis not present

## 2017-11-26 DIAGNOSIS — M76822 Posterior tibial tendinitis, left leg: Secondary | ICD-10-CM | POA: Diagnosis not present

## 2017-12-02 DIAGNOSIS — L4050 Arthropathic psoriasis, unspecified: Secondary | ICD-10-CM | POA: Diagnosis not present

## 2017-12-03 DIAGNOSIS — M71571 Other bursitis, not elsewhere classified, right ankle and foot: Secondary | ICD-10-CM | POA: Diagnosis not present

## 2017-12-03 DIAGNOSIS — M76822 Posterior tibial tendinitis, left leg: Secondary | ICD-10-CM | POA: Diagnosis not present

## 2017-12-09 DIAGNOSIS — M5441 Lumbago with sciatica, right side: Secondary | ICD-10-CM | POA: Diagnosis not present

## 2017-12-09 DIAGNOSIS — G603 Idiopathic progressive neuropathy: Secondary | ICD-10-CM | POA: Diagnosis not present

## 2017-12-09 DIAGNOSIS — M5442 Lumbago with sciatica, left side: Secondary | ICD-10-CM | POA: Diagnosis not present

## 2017-12-09 DIAGNOSIS — R270 Ataxia, unspecified: Secondary | ICD-10-CM | POA: Diagnosis not present

## 2017-12-09 DIAGNOSIS — G5603 Carpal tunnel syndrome, bilateral upper limbs: Secondary | ICD-10-CM | POA: Diagnosis not present

## 2017-12-09 DIAGNOSIS — R202 Paresthesia of skin: Secondary | ICD-10-CM | POA: Diagnosis not present

## 2017-12-17 ENCOUNTER — Telehealth: Payer: Self-pay | Admitting: Cardiology

## 2017-12-17 MED ORDER — LOSARTAN POTASSIUM 50 MG PO TABS: 50.0000 mg | ORAL_TABLET | Freq: Every day | ORAL | 3 refills | Status: DC

## 2017-12-17 NOTE — Telephone Encounter (Signed)
Okay to change valsartan 37m to losartan 50mg  daily  Rx sent to prefer pharmacy  Patient to monitor BP 2-3x/week for 4 weeks and call back if BP not well control.

## 2017-12-17 NOTE — Telephone Encounter (Signed)
Returned the call to the patient. He stated that the Valsartan is too costly for him and would like to be switched to something cheaper. [facility] get [facility] Los[facility]. Message will be routed to pharmd and the provider for their recommendations.

## 2017-12-17 NOTE — Telephone Encounter (Signed)
Pt calling requesting a cheaper version of Valsartan, pt states that the medication prescribed is expensive. Pt would like a call back. Please address

## 2018-01-17 DIAGNOSIS — G4733 Obstructive sleep apnea (adult) (pediatric): Secondary | ICD-10-CM | POA: Diagnosis not present

## 2018-01-19 DIAGNOSIS — E890 Postprocedural hypothyroidism: Secondary | ICD-10-CM | POA: Diagnosis not present

## 2018-01-19 DIAGNOSIS — E559 Vitamin D deficiency, unspecified: Secondary | ICD-10-CM | POA: Diagnosis not present

## 2018-01-19 DIAGNOSIS — M8580 Other specified disorders of bone density and structure, unspecified site: Secondary | ICD-10-CM | POA: Insufficient documentation

## 2018-01-19 DIAGNOSIS — I10 Essential (primary) hypertension: Secondary | ICD-10-CM | POA: Diagnosis not present

## 2018-01-19 DIAGNOSIS — Z8639 Personal history of other endocrine, nutritional and metabolic disease: Secondary | ICD-10-CM

## 2018-01-19 DIAGNOSIS — E782 Mixed hyperlipidemia: Secondary | ICD-10-CM | POA: Diagnosis not present

## 2018-01-19 HISTORY — DX: Personal history of other endocrine, nutritional and metabolic disease: Z86.39

## 2018-01-27 DIAGNOSIS — L4050 Arthropathic psoriasis, unspecified: Secondary | ICD-10-CM | POA: Diagnosis not present

## 2018-02-01 DIAGNOSIS — M79671 Pain in right foot: Secondary | ICD-10-CM | POA: Diagnosis not present

## 2018-02-01 DIAGNOSIS — N183 Chronic kidney disease, stage 3 (moderate): Secondary | ICD-10-CM | POA: Diagnosis not present

## 2018-02-01 DIAGNOSIS — M150 Primary generalized (osteo)arthritis: Secondary | ICD-10-CM | POA: Diagnosis not present

## 2018-02-01 DIAGNOSIS — L4050 Arthropathic psoriasis, unspecified: Secondary | ICD-10-CM | POA: Diagnosis not present

## 2018-02-01 DIAGNOSIS — M545 Low back pain: Secondary | ICD-10-CM | POA: Diagnosis not present

## 2018-02-01 DIAGNOSIS — E669 Obesity, unspecified: Secondary | ICD-10-CM | POA: Diagnosis not present

## 2018-02-01 DIAGNOSIS — L409 Psoriasis, unspecified: Secondary | ICD-10-CM | POA: Diagnosis not present

## 2018-02-01 DIAGNOSIS — Z6830 Body mass index (BMI) 30.0-30.9, adult: Secondary | ICD-10-CM | POA: Diagnosis not present

## 2018-02-07 ENCOUNTER — Telehealth: Payer: Self-pay | Admitting: Pharmacist

## 2018-02-07 DIAGNOSIS — M85851 Other specified disorders of bone density and structure, right thigh: Secondary | ICD-10-CM | POA: Diagnosis not present

## 2018-02-07 DIAGNOSIS — Z7952 Long term (current) use of systemic steroids: Secondary | ICD-10-CM | POA: Diagnosis not present

## 2018-02-07 MED ORDER — LOSARTAN POTASSIUM 50 MG PO TABS: 50.0000 mg | ORAL_TABLET | Freq: Two times a day (BID) | ORAL | 3 refills | Status: DC

## 2018-02-07 NOTE — Telephone Encounter (Signed)
Pt called to report BP measurements in 280K-349Z systolic. He has been taking losartan 50mg  BID the last 2 days and this seems to help. Advised ok to to increase to 50mg  BID. Rx sent to pharmacy for higher dose and appt made for BP check and BMET on 3/27.

## 2018-02-08 DIAGNOSIS — N529 Male erectile dysfunction, unspecified: Secondary | ICD-10-CM | POA: Diagnosis not present

## 2018-02-08 DIAGNOSIS — N138 Other obstructive and reflux uropathy: Secondary | ICD-10-CM | POA: Diagnosis not present

## 2018-02-08 DIAGNOSIS — R109 Unspecified abdominal pain: Secondary | ICD-10-CM | POA: Diagnosis not present

## 2018-02-08 DIAGNOSIS — N401 Enlarged prostate with lower urinary tract symptoms: Secondary | ICD-10-CM | POA: Diagnosis not present

## 2018-02-08 DIAGNOSIS — N289 Disorder of kidney and ureter, unspecified: Secondary | ICD-10-CM | POA: Diagnosis not present

## 2018-02-11 DIAGNOSIS — N2889 Other specified disorders of kidney and ureter: Secondary | ICD-10-CM | POA: Diagnosis not present

## 2018-02-11 DIAGNOSIS — N189 Chronic kidney disease, unspecified: Secondary | ICD-10-CM | POA: Diagnosis not present

## 2018-02-23 DIAGNOSIS — D485 Neoplasm of uncertain behavior of skin: Secondary | ICD-10-CM | POA: Diagnosis not present

## 2018-02-23 DIAGNOSIS — L570 Actinic keratosis: Secondary | ICD-10-CM | POA: Diagnosis not present

## 2018-02-23 DIAGNOSIS — L218 Other seborrheic dermatitis: Secondary | ICD-10-CM | POA: Diagnosis not present

## 2018-02-28 ENCOUNTER — Other Ambulatory Visit: Payer: Self-pay | Admitting: Cardiology

## 2018-03-02 ENCOUNTER — Ambulatory Visit (INDEPENDENT_AMBULATORY_CARE_PROVIDER_SITE_OTHER): Payer: Medicare Other | Admitting: Pharmacist

## 2018-03-02 ENCOUNTER — Encounter: Payer: Self-pay | Admitting: Pharmacist

## 2018-03-02 VITALS — BP 140/88 | HR 62

## 2018-03-02 DIAGNOSIS — I10 Essential (primary) hypertension: Secondary | ICD-10-CM | POA: Diagnosis not present

## 2018-03-02 MED ORDER — AMLODIPINE BESYLATE 2.5 MG PO TABS: 2.5000 mg | ORAL_TABLET | Freq: Every day | ORAL | 1 refills | Status: DC

## 2018-03-02 MED ORDER — LOSARTAN POTASSIUM 50 MG PO TABS: 50.0000 mg | ORAL_TABLET | Freq: Two times a day (BID) | ORAL | 3 refills | Status: DC

## 2018-03-02 NOTE — Patient Instructions (Addendum)
Return for a follow up appointment in 4 week  Check your blood pressure at home daily (if able) and keep record of the readings.  Take your BP meds as follows: *START taking amlodipine 2.5mg  daily* Continue taking all other medication as prescribed  REPEAT blood work today  Bring all of your meds, your BP cuff and your record of home blood pressures to your next appointment.  Exercise as you're able, try to walk approximately 30 minutes per day.  Keep salt intake to a minimum, especially watch canned and prepared boxed foods.  Eat more fresh fruits and vegetables and fewer canned items.  Avoid eating in fast food restaurants.    HOW TO TAKE YOUR BLOOD PRESSURE: . Rest 5 minutes before taking your blood pressure. .  Don't smoke or drink caffeinated beverages for at least 30 minutes before. . Take your blood pressure before (not after) you eat. . Sit comfortably with your back supported and both feet on the floor (don't cross your legs). . Elevate your arm to heart level on a table or a desk. . Use the proper sized cuff. It should fit smoothly and snugly around your bare upper arm. There should be enough room to slip a fingertip under the cuff. The bottom edge of the cuff should be 1 inch above the crease of the elbow. . Ideally, take 3 measurements at one sitting and record the average.

## 2018-03-02 NOTE — Progress Notes (Signed)
Patient ID: Martin Rubio                 DOB: 07/19/1946                      MRN: [id number]      HPI: Martin Rubio is a 72 y.o. male referred by Dr. [name] to HTN clinic.  PMH includes uncontrolled hypertension, hyperlipidemia, and sleep apnea.  Valsartan 80mg  was changed due to cost and patient now on losartan 50 mg twice daily.  Patient presents today for hypertension follow up and denies swelling, increased fatigue, increased dizziness, or any other problems with current therapy.    Current HTN meds:  Losartan 50mg  twice daily  BP goal: 130/80  Family history: Mother had MI/CABG in her late 59's, stroke in mid 62's, father with MI at 34 (fatal); 1 brother with MI in his early 53's.  Diet: Eats both at home and in restaurants. Admits to liking pastas, breads, rice (he enjoys all [place] and [place] foods). Eats eggs, cheese, bacon and chips regularly.  Home BP readings:  6 readings; average 147/84 (pulse 54-64 bpm)  Wt Readings from Last 3 Encounters:  07/26/17 223 lb (101.2 kg)  06/15/17 219 lb (99.3 kg)  01/27/17 214 lb (97.1 kg)   BP Readings from Last 3 Encounters:  03/02/18 140/88  07/26/17 123/80  06/15/17 136/84   Pulse Readings from Last 3 Encounters:  03/02/18 62  07/26/17 67  06/15/17 (!) 58    Past Medical History:  Diagnosis Date  . Bronchitis   . Diverticulosis   . FH: BPH (benign prostatic hypertrophy)   . HTN (hypertension)   . Hyperlipidemia   . Obesity   . Psoriatic arthritis ([place])   . Sleep apnea    CPAP  . Thyroid disease     Current Outpatient Medications on File Prior to Visit  Medication Sig Dispense Refill  . alendronate (FOSAMAX) 70 MG tablet Take 70 mg by mouth once a week. Take with a full glass of water on an empty stomach.    . Alirocumab (PRALUENT) 150 MG/ML SOPN Inject 150 mg into the skin every 14 (fourteen) days. 2 pen 11  . Cholecalciferol (VITAMIN D3) 1000 units CAPS Take 3 capsules by mouth daily.     Marland Kitchen  ezetimibe (ZETIA) 10 MG tablet Take 1 tablet (10 mg total) by mouth daily. 90 tablet 3  . Golimumab 100 MG/ML SOAJ Inject 100 mg into the skin every 8 (eight) weeks.    Marland Kitchen levothyroxine (SYNTHROID, LEVOTHROID) 125 MCG tablet Take 125 mcg by mouth daily before breakfast.     . Magnesium Hydroxide 400 MG CHEW Chew 1 tablet by mouth daily.    . Melatonin 5 MG TABS Take 5 mg by mouth.    . Misc Natural Products (JOINT HEALTH) CAPS Take 1-2 capsules by mouth daily.    Marland Kitchen omeprazole (PRILOSEC) 20 MG capsule Take 20 mg by mouth 2 (two) times daily before a meal.    . vitamin C (ASCORBIC ACID) 500 MG tablet Take 1,000 mg by mouth daily.      No current facility-administered medications on file prior to visit.     Allergies  Allergen Reactions  . Codeine     Tension, nasty feeling   . Prednisone Swelling  . Statins     Joint pain  . Sulfa Antibiotics     Blood pressure 140/88, pulse 62, SpO2 97 %.  Essential hypertension Blood pressure remains  above desire goal of 130/80 but improved from previous readings. Patient self adjusted his losartan from 50mg  daily to 50mg  twice daily about 3 weeks ago. Will repeat BMET today due to history of CKD, and will add low dose amlodipine to his current regimen. Patient is to follow up with HTN clinic in 4 weeks.  [name] PharmD, BCPS, [place] [address] [name] [address] 03/02/2018 8:02 PM

## 2018-03-02 NOTE — Assessment & Plan Note (Addendum)
Blood pressure remains above desire goal of 130/80 but improved from previous readings. Patient self adjusted his losartan from 50mg  daily to 50mg  twice daily about 3 weeks ago. Will repeat BMET today due to history of CKD, and will add low dose amlodipine to his current regimen. Patient is to follow up with HTN clinic in 4 weeks.

## 2018-03-03 LAB — BASIC METABOLIC PANEL
BUN/Creatinine Ratio: 10 (ref 10–24)
BUN: 14 mg/dL (ref 8–27)
CO2: 24 mmol/L (ref 20–29)
CREATININE: 1.42 mg/dL — AB (ref 0.76–1.27)
Calcium: 9.5 mg/dL (ref 8.6–10.2)
Chloride: 101 mmol/L (ref 96–106)
GFR calc Af Amer: 56 mL/min/{1.73_m2} — ABNORMAL LOW (ref >59)
GFR calc non Af Amer: 49 mL/min/{1.73_m2} — ABNORMAL LOW (ref >59)
GLUCOSE: 88 mg/dL (ref 65–99)
POTASSIUM: 4.2 mmol/L (ref 3.5–5.2)
SODIUM: 141 mmol/L (ref 134–144)

## 2018-03-11 ENCOUNTER — Telehealth: Payer: Self-pay | Admitting: Pharmacist Clinician (PhC)/ Clinical Pharmacy Specialist

## 2018-03-11 NOTE — Telephone Encounter (Signed)
Patient was started on amlodipine 2.5 mg daily at OV with Raquel Rodriguez-Guzman on 3.27.  Patient was already on losartan 50 mg bid (pt self increased dose prior to that appt).  He reports that after 1 dose of amlodipine his BP dropped from average of 270 systolic to 90 systolic.  Reports feeling "horrible" and hasn't taken another dose.  BP back up to mostly 623'J systolic.    Of note, patient had BMET that day due to increase in losartan, SCr increased from 1.06 to 1.42.  Dr. [name] advised to continue with current dose of losartan, but repeat BMET in 10 days.  Patient will be out of town from this weekend until Thursday, but will come in Friday morning (April 12) to repeat labs.  He understands to continue with losartan 50 mg bid and hold the amlodipine.    Suspect that the drop in BP was more of a fluke, as the amlodipine doesn't usually cause that much of a drop, especially at the 2. 5 mg dose.  Could consider cutting losartan back to 50 mg daily and re-challenging with amlodipine 2.5 mg.

## 2018-03-14 ENCOUNTER — Telehealth: Payer: Self-pay | Admitting: *Deleted

## 2018-03-14 DIAGNOSIS — Z79899 Other long term (current) drug therapy: Secondary | ICD-10-CM

## 2018-03-14 NOTE — Telephone Encounter (Signed)
Spoke with pt about his blood, BMP ordered for pt to get done, pt made aware

## 2018-03-14 NOTE — Telephone Encounter (Signed)
-----   Message from Minus Breeding, MD sent at 03/05/2018  2:37 PM EDT ----- Creat is elevated compared with previous.  He recently had his ARB changed.  I would not make any med changes but would suggest repeat BMET in 10 days.  Call Mr. Bracco with the results and send results to Libby Maw, MD

## 2018-03-18 DIAGNOSIS — H2513 Age-related nuclear cataract, bilateral: Secondary | ICD-10-CM | POA: Diagnosis not present

## 2018-03-18 DIAGNOSIS — H04123 Dry eye syndrome of bilateral lacrimal glands: Secondary | ICD-10-CM | POA: Diagnosis not present

## 2018-03-18 DIAGNOSIS — H43813 Vitreous degeneration, bilateral: Secondary | ICD-10-CM | POA: Diagnosis not present

## 2018-03-18 DIAGNOSIS — Z79899 Other long term (current) drug therapy: Secondary | ICD-10-CM | POA: Diagnosis not present

## 2018-03-19 LAB — BASIC METABOLIC PANEL
BUN/Creatinine Ratio: 9 — ABNORMAL LOW (ref 10–24)
BUN: 12 mg/dL (ref 8–27)
CHLORIDE: 104 mmol/L (ref 96–106)
CO2: 24 mmol/L (ref 20–29)
CREATININE: 1.28 mg/dL — AB (ref 0.76–1.27)
Calcium: 9.1 mg/dL (ref 8.6–10.2)
GFR calc Af Amer: 64 mL/min/{1.73_m2} (ref >59)
GFR calc non Af Amer: 55 mL/min/{1.73_m2} — ABNORMAL LOW (ref >59)
Glucose: 93 mg/dL (ref 65–99)
Potassium: 4.2 mmol/L (ref 3.5–5.2)
Sodium: 144 mmol/L (ref 134–144)

## 2018-03-24 DIAGNOSIS — Z79899 Other long term (current) drug therapy: Secondary | ICD-10-CM | POA: Diagnosis not present

## 2018-03-24 DIAGNOSIS — L4050 Arthropathic psoriasis, unspecified: Secondary | ICD-10-CM | POA: Diagnosis not present

## 2018-03-29 DIAGNOSIS — M5442 Lumbago with sciatica, left side: Secondary | ICD-10-CM | POA: Diagnosis not present

## 2018-03-29 DIAGNOSIS — R270 Ataxia, unspecified: Secondary | ICD-10-CM | POA: Diagnosis not present

## 2018-03-29 DIAGNOSIS — R202 Paresthesia of skin: Secondary | ICD-10-CM | POA: Diagnosis not present

## 2018-03-29 DIAGNOSIS — M5441 Lumbago with sciatica, right side: Secondary | ICD-10-CM | POA: Diagnosis not present

## 2018-03-29 DIAGNOSIS — M5412 Radiculopathy, cervical region: Secondary | ICD-10-CM | POA: Diagnosis not present

## 2018-03-30 DIAGNOSIS — G4733 Obstructive sleep apnea (adult) (pediatric): Secondary | ICD-10-CM | POA: Diagnosis not present

## 2018-03-30 DIAGNOSIS — E669 Obesity, unspecified: Secondary | ICD-10-CM | POA: Diagnosis not present

## 2018-03-30 DIAGNOSIS — K219 Gastro-esophageal reflux disease without esophagitis: Secondary | ICD-10-CM | POA: Diagnosis not present

## 2018-04-25 ENCOUNTER — Telehealth: Payer: Self-pay | Admitting: Pharmacist Clinician (PhC)/ Clinical Pharmacy Specialist

## 2018-04-25 ENCOUNTER — Encounter: Payer: Self-pay | Admitting: Cardiology

## 2018-04-25 NOTE — Telephone Encounter (Signed)
Patient called with 2 concerns:  1.   Received paperwork from Pral[employer] regarding potential adverse response to medication.  He is not sure where this came from or why.  Explained that when he called for the refill, they ask "how are you feeling" and if you have a health complaint that you mention, they will often assume they need to fill out the adverse response form.  He reports doing well with Praluent and will toss the form.  2.  In the past couple of weeks he has been having drops in his BP to < 798 systolic, which often causes him to feel fatigued and weak.  He currently takes losartan 50 mg bid, but skips as many doses as he takes.  Advised that he take only a 50 mg tablet for BP > 921 systolic.  He should do this for a week and call back to let us know his BP readings.

## 2018-05-15 NOTE — Progress Notes (Signed)
HPI The patient returns for follow up of HTN.  His last echo in Nov of 2016 demonstrated no septal hypertrophy.  He has had problems with BP and with self medication secondary to medication tolerance issues.  He wore a 24 hour BP monitor and has BP was not well controlled.  After the last visit he had hypotension which he reported was related to low dose Norvasc.    Since I last saw him he has been taking his blood pressure twice daily and he takes his Cozaar if his systolic is about 102.  We went through the blood pressure diary and he does this occasionally but not always.  He has been having pain in his hip and had a tooth extraction which cause discomfort and so he has been taking nonsteroidals and having some pain.  This might be driving his blood pressure.  He is also exercising about 3 times per week and has lost some weight on weight watchers and this might be helping him.  He denies any acute cardiovascular symptoms. The patient denies any new symptoms such as chest discomfort, neck or arm discomfort. There has been no new shortness of breath, PND or orthopnea. There have been no reported palpitations, presyncope or syncope.   Allergies  Allergen Reactions  . Codeine     Tension, nasty feeling   . Prednisone Swelling  . Statins     Joint pain  . Sulfa Antibiotics     Current Outpatient Medications  Medication Sig Dispense Refill  . ezetimibe (ZETIA) 10 MG tablet Take 10 mg by mouth daily.    Marland Kitchen losartan (COZAAR) 50 MG tablet Take 50 mg by mouth daily.    Marland Kitchen alendronate (FOSAMAX) 70 MG tablet Take 70 mg by mouth once a week. Take with a full glass of water on an empty stomach.    . Alirocumab (PRALUENT) 150 MG/ML SOPN Inject 150 mg into the skin every 14 (fourteen) days. 2 pen 11  . Cholecalciferol (VITAMIN D3) 1000 units CAPS Take 3 capsules by mouth daily.     . Golimumab 100 MG/ML SOAJ Inject 100 mg into the skin every 8 (eight) weeks.    Marland Kitchen levothyroxine (SYNTHROID,  LEVOTHROID) 125 MCG tablet Take 125 mcg by mouth daily before breakfast.     . Magnesium Hydroxide 400 MG CHEW Chew 1 tablet by mouth daily.    . Melatonin 5 MG TABS Take 5 mg by mouth.    . Misc Natural Products (JOINT HEALTH) CAPS Take 1-2 capsules by mouth daily.    Marland Kitchen omeprazole (PRILOSEC) 20 MG capsule Take 20 mg by mouth 2 (two) times daily before a meal.    . vitamin C (ASCORBIC ACID) 500 MG tablet Take 1,000 mg by mouth daily.      No current facility-administered medications for this visit.     Past Medical History:  Diagnosis Date  . Bronchitis   . Diverticulosis   . FH: BPH (benign prostatic hypertrophy)   . HTN (hypertension)   . Hyperlipidemia   . Obesity   . Psoriatic arthritis ([place])   . Sleep apnea    CPAP  . Thyroid disease     Past Surgical History:  Procedure Laterality Date  . INGUINAL HERNIA REPAIR     bilateral  . KNEE SURGERY     bilateral arthroscopic  . TRANSURETHRAL RESECTION OF PROSTATE    . UMBILICAL HERNIA REPAIR      ROS:   Positive for hip  joint pain, headaches, occasional dizziness.   Otherwise as stated in the HPI and negative for all other systems.   PHYSICAL EXAM BP 138/82   Pulse (!) 55   Ht 5\' 10"  (1.778 m)   Wt 206 lb (93.4 kg)   BMI 29.56 kg/m   GENERAL:  Well appearing NECK:  No jugular venous distention, waveform within normal limits, carotid upstroke brisk and symmetric, no bruits, no thyromegaly LUNGS:  Clear to auscultation bilaterally CHEST:  Unremarkable HEART:  PMI not displaced or sustained,S1 and S2 within normal limits, no S3, no S4, no clicks, no rubs, 2 out of 6 apical systolic murmur at the apex and radiating slightly, no diastolic murmurs ABD:  Flat, positive bowel sounds normal in frequency in pitch, no bruits, no rebound, no guarding, no midline pulsatile mass, no hepatomegaly, no splenomegaly EXT:  2 plus pulses throughout, no edema, no cyanosis no clubbing  Lab Results  Component Value Date   CHOL 140  11/01/2017   TRIG 161 (H) 11/01/2017   HDL 45 11/01/2017   LDLCALC 63 11/01/2017    EKG: Sinus rhythm, rate 55, left axis deviation, repolarization ST changes.  No change from previous. 05/16/2018   ASSESSMENT AND PLAN   Left common iliac anuerysm He had stable aneurysms in October. I will follow again in two years.    HTN The blood pressure is slightly labile.   However, he is been quite sensitive to medications.  He is willing to take his blood pressure twice daily and I adjust his medications as he has been. Given this he can continue as listed.  No change in therapy.   Septal hypertrophy I will follow this clinically.   Dyslipidemia He is being managed in the lipid clinic with PCSK9 inhibitor.  He is doing quite well with this and his LDL is 63.  He will continue on meds as listed.   Obesity He is losing weight and I applaud and encourage more of this.

## 2018-05-16 ENCOUNTER — Encounter: Payer: Self-pay | Admitting: Cardiology

## 2018-05-16 ENCOUNTER — Ambulatory Visit (INDEPENDENT_AMBULATORY_CARE_PROVIDER_SITE_OTHER): Payer: Medicare Other | Admitting: Cardiology

## 2018-05-16 VITALS — BP 138/82 | HR 55 | Ht 70.0 in | Wt 206.0 lb

## 2018-05-16 DIAGNOSIS — I422 Other hypertrophic cardiomyopathy: Secondary | ICD-10-CM

## 2018-05-16 DIAGNOSIS — I10 Essential (primary) hypertension: Secondary | ICD-10-CM | POA: Diagnosis not present

## 2018-05-16 DIAGNOSIS — E785 Hyperlipidemia, unspecified: Secondary | ICD-10-CM | POA: Diagnosis not present

## 2018-05-16 DIAGNOSIS — I723 Aneurysm of iliac artery: Secondary | ICD-10-CM

## 2018-05-16 NOTE — Patient Instructions (Addendum)
Medication Instructions:  Continue current medications  If you need a refill on your cardiac medications before your next appointment, please call your pharmacy.  Labwork: Fasting Lipids in 3 weeks  Testing/Procedures: None Ordered  Follow-Up: Your physician wants you to follow-up in: 1 Year. You should receive a reminder letter in the mail two months in advance. If you do not receive a letter, please call our office [phone].     Thank you for choosing [facility] at [facility]!!      3

## 2018-05-19 DIAGNOSIS — L4050 Arthropathic psoriasis, unspecified: Secondary | ICD-10-CM | POA: Diagnosis not present

## 2018-05-23 ENCOUNTER — Other Ambulatory Visit: Payer: Self-pay | Admitting: Cardiology

## 2018-05-30 DIAGNOSIS — M71572 Other bursitis, not elsewhere classified, left ankle and foot: Secondary | ICD-10-CM | POA: Diagnosis not present

## 2018-05-30 DIAGNOSIS — M76822 Posterior tibial tendinitis, left leg: Secondary | ICD-10-CM | POA: Diagnosis not present

## 2018-06-02 DIAGNOSIS — M545 Low back pain: Secondary | ICD-10-CM | POA: Diagnosis not present

## 2018-06-02 DIAGNOSIS — M150 Primary generalized (osteo)arthritis: Secondary | ICD-10-CM | POA: Diagnosis not present

## 2018-06-02 DIAGNOSIS — M79671 Pain in right foot: Secondary | ICD-10-CM | POA: Diagnosis not present

## 2018-06-02 DIAGNOSIS — E663 Overweight: Secondary | ICD-10-CM | POA: Diagnosis not present

## 2018-06-02 DIAGNOSIS — Z6829 Body mass index (BMI) 29.0-29.9, adult: Secondary | ICD-10-CM | POA: Diagnosis not present

## 2018-06-02 DIAGNOSIS — L409 Psoriasis, unspecified: Secondary | ICD-10-CM | POA: Diagnosis not present

## 2018-06-02 DIAGNOSIS — L4050 Arthropathic psoriasis, unspecified: Secondary | ICD-10-CM | POA: Diagnosis not present

## 2018-06-06 DIAGNOSIS — M71572 Other bursitis, not elsewhere classified, left ankle and foot: Secondary | ICD-10-CM | POA: Diagnosis not present

## 2018-06-06 DIAGNOSIS — M76822 Posterior tibial tendinitis, left leg: Secondary | ICD-10-CM | POA: Diagnosis not present

## 2018-06-07 DIAGNOSIS — Z1211 Encounter for screening for malignant neoplasm of colon: Secondary | ICD-10-CM | POA: Diagnosis not present

## 2018-06-07 DIAGNOSIS — R1310 Dysphagia, unspecified: Secondary | ICD-10-CM | POA: Diagnosis not present

## 2018-06-13 DIAGNOSIS — E785 Hyperlipidemia, unspecified: Secondary | ICD-10-CM | POA: Diagnosis not present

## 2018-06-13 LAB — LIPID PANEL
Chol/HDL Ratio: 2.6 ratio (ref 0.0–5.0)
Cholesterol, Total: 115 mg/dL (ref 100–199)
HDL: 45 mg/dL (ref >39)
LDL CALC: 43 mg/dL (ref 0–99)
Triglycerides: 135 mg/dL (ref 0–149)
VLDL CHOLESTEROL CAL: 27 mg/dL (ref 5–40)

## 2018-06-14 DIAGNOSIS — K222 Esophageal obstruction: Secondary | ICD-10-CM | POA: Diagnosis not present

## 2018-06-14 DIAGNOSIS — R1314 Dysphagia, pharyngoesophageal phase: Secondary | ICD-10-CM | POA: Diagnosis not present

## 2018-06-14 DIAGNOSIS — K228 Other specified diseases of esophagus: Secondary | ICD-10-CM | POA: Diagnosis not present

## 2018-06-22 DIAGNOSIS — K644 Residual hemorrhoidal skin tags: Secondary | ICD-10-CM | POA: Diagnosis not present

## 2018-06-24 ENCOUNTER — Ambulatory Visit (INDEPENDENT_AMBULATORY_CARE_PROVIDER_SITE_OTHER): Payer: Medicare Other | Admitting: Family Medicine

## 2018-06-24 ENCOUNTER — Encounter: Payer: Self-pay | Admitting: Family Medicine

## 2018-06-24 VITALS — BP 130/82 | HR 64 | Temp 98.4°F | Ht 70.0 in | Wt 197.4 lb

## 2018-06-24 DIAGNOSIS — K649 Unspecified hemorrhoids: Secondary | ICD-10-CM

## 2018-06-24 MED ORDER — HYDROCORTISONE ACE-PRAMOXINE 2.5-1 % RE CREA: 1.0000 "application " | TOPICAL_CREAM | Freq: Three times a day (TID) | RECTAL | 1 refills | Status: DC

## 2018-06-24 NOTE — Progress Notes (Signed)
Martin Rubio - 72 y.o. male MRN [id number]  Date of birth: [birth year]  Subjective Chief Complaint  Patient presents with  . Establish Care    hemmorhoid, three days ago, tried OTC cream.    HPI Martin Rubio is a 72 y.o. male with history of HTN, HLD, GERD, OSA, RA, Hypothyroidism, BPH and hypertrophic cardiomyopathy here today to establish with new PCP.  He is followed by several specialists as well including cardiology (Dr. [name]), Pulmonology (Dr. Greggory Stallion), Urology (Dr. Felipa Eth), and Rheumatology (Dr. Amil Amen).  Reports that chronic conditions are well controlled at this time and will schedule appt to discuss these at a future date.  Would like to discuss hemorrhoid issues today.  Reports hemorrhoid that began 3 days ago.  Has had in the past as well, previous I&D while in the [employer] aboard a ship. Seen by another clinic recently as well and prescribed anusol but did not pick these up due to cost.  Has been using OTC preparation H without a whole lot of improvement.  Denies significant pain with this but has quite a bit of itching.  He has had some mild bleeding at times as well.  He denies issues with constipation or diarrhea.  He has not had fever, chills, or purulent drainage from the rectal area.  He is up to date on colon cancer screening.   ROS:  A comprehensive ROS was completed and negative except as noted per HPI    Allergies  Allergen Reactions  . Codeine     Tension, nasty feeling   . Nsaids Other (See Comments)    Renal insufficiency  . Prednisone Swelling  . Statins Other (See Comments)    Joint pain Joint pain Joint pain Joint pain Joint pain   . Sulfa Antibiotics Other (See Comments)    Past Medical History:  Diagnosis Date  . Bronchitis   . Diverticulosis   . FH: BPH (benign prostatic hypertrophy)   . History of Graves' disease 01/19/2018   S/p ablation  . HTN (hypertension)   . Hyperlipidemia   . Obesity   . Psoriatic arthritis ([place])   . Rheumatoid  arthritis ([place]) 08/07/2014  . Sleep apnea    CPAP  . Thyroid disease     Past Surgical History:  Procedure Laterality Date  . INGUINAL HERNIA REPAIR     bilateral  . KNEE SURGERY     bilateral arthroscopic  . TRANSURETHRAL RESECTION OF PROSTATE    . UMBILICAL HERNIA REPAIR      Social History   Socioeconomic History  . Marital status: Married    Spouse name: Not on file  . Number of children: 2  . Years of education: Not on file  . Highest education level: Not on file  Occupational History  . Occupation: Retired    Fish farm manager: [employer]  . Financial resource strain: Not on file  . Food insecurity:    Worry: Not on file    Inability: Not on file  . Transportation needs:    Medical: Not on file    Non-medical: Not on file  Tobacco Use  . Smoking status: Former Smoker    Types: Cigarettes  . Smokeless tobacco: Never Used  . Tobacco comment: Quit 30 years ago.  Substance and Sexual Activity  . Alcohol use: No  . Drug use: No  . Sexual activity: Not on file  Lifestyle  . Physical activity:    Days per week: Not on file  Minutes per session: Not on file  . Stress: Not on file  Relationships  . Social connections:    Talks on phone: Not on file    Gets together: Not on file    Attends religious service: Not on file    Active member of club or organization: Not on file    Attends meetings of clubs or organizations: Not on file    Relationship status: Not on file  Other Topics Concern  . Not on file  Social History Narrative   Lives with wife.    Family History  Problem Relation Age of Onset  . CAD Father 5       Died age 15  . CAD Mother 77       CABG  . Diabetes Brother     Health Maintenance  Topic Date Due  . Hepatitis C Screening  1946-12-06  . TETANUS/TDAP  12/04/1963  . COLONOSCOPY  12/03/1994  . PNA vac Low Risk Adult (1 of 2 - PCV13) 12/03/2009  . INFLUENZA VACCINE  07/07/2018     ----------------------------------------------------------------------------------------------------------------------------------------------------------------------------------------------------------------- Physical Exam BP 130/82 (BP Location: Left Arm, Patient Position: Sitting, Cuff Size: Normal)   Pulse 64   Temp 98.4 F (36.9 C) (Oral)   Ht 5\' 10"  (1.778 m)   Wt 197 lb 6.4 oz (89.5 kg)   SpO2 99%   BMI 28.32 kg/m   Physical Exam  Constitutional: He is oriented to person, place, and time. He appears well-nourished. No distress.  HENT:  Head: Normocephalic and atraumatic.  Mouth/Throat: Oropharynx is clear and moist.  Eyes: No scleral icterus.  Abdominal: Soft. He exhibits no distension. There is no tenderness.  Genitourinary:  Genitourinary Comments: Partially thrombosed external hemorrhoid.  No bleeding noted.    Neurological: He is alert and oriented to person, place, and time.  Skin: Skin is warm and dry.  Psychiatric: He has a normal mood and affect. His behavior is normal.    ------------------------------------------------------------------------------------------------------------------------------------------------------------------------------------------------------------------- Assessment and Plan  Hemorrhoids Partially thrombosed external hemorrhoid.  Not painful, would not recommend I&D at this time.  Recommend sitz bath Witch hazel wipes for comfort Rx for analpram for itching/irritation Increase fiber and fluid intake Call if not improving or for worsening symptoms.

## 2018-06-24 NOTE — Patient Instructions (Addendum)
Try tucks (witch hazel) pads when wiping Use cream as directed.   Hemorrhoids Hemorrhoids are swollen veins in and around the rectum or anus. Hemorrhoids can cause pain, itching, or bleeding. Most of the time, they do not cause serious problems. They usually get better with diet changes, lifestyle changes, and other home treatments. Follow these instructions at home: Eating and drinking  Eat foods that have fiber, such as whole grains, beans, nuts, fruits, and vegetables. Ask your doctor about taking products that have added fiber (fibersupplements).  Drink enough fluid to keep your pee (urine) clear or pale yellow. For Pain and Swelling  Take a warm-water bath (sitz bath) for 20 minutes to ease pain. Do this 3-4 times a day.  If directed, put ice on the painful area. It may be helpful to use ice between your warm baths. ? Put ice in a plastic bag. ? Place a towel between your skin and the bag. ? Leave the ice on for 20 minutes, 2-3 times a day. General instructions  Take over-the-counter and prescription medicines only as told by your doctor. ? Medicated creams and medicines that are inserted into the anus (suppositories) may be used or applied as told.  Exercise often.  Go to the bathroom when you have the urge to poop (to have a bowel movement). Do not wait.  Avoid pushing too hard (straining) when you poop.  Keep the butt area dry and clean. Use wet toilet paper or moist paper towels.  Do not sit on the toilet for a long time. Contact a doctor if:  You have any of these: ? Pain and swelling that do not get better with treatment or medicine. ? Bleeding that will not stop. ? Trouble pooping or you cannot poop. ? Pain or swelling outside the area of the hemorrhoids. This information is not intended to replace advice given to you by your health care provider. Make sure you discuss any questions you have with your health care provider. Document Released: 09/01/2008 Document  Revised: 04/30/2016 Document Reviewed: 08/07/2015 Elsevier Interactive Patient Education  [employer].

## 2018-06-24 NOTE — Assessment & Plan Note (Signed)
Partially thrombosed external hemorrhoid.  Not painful, would not recommend I&D at this time.  Recommend sitz bath Witch hazel wipes for comfort Rx for analpram for itching/irritation Increase fiber and fluid intake Call if not improving or for worsening symptoms.

## 2018-06-28 DIAGNOSIS — G43109 Migraine with aura, not intractable, without status migrainosus: Secondary | ICD-10-CM | POA: Diagnosis not present

## 2018-06-28 DIAGNOSIS — R270 Ataxia, unspecified: Secondary | ICD-10-CM | POA: Diagnosis not present

## 2018-06-28 DIAGNOSIS — M5441 Lumbago with sciatica, right side: Secondary | ICD-10-CM | POA: Diagnosis not present

## 2018-06-28 DIAGNOSIS — M5442 Lumbago with sciatica, left side: Secondary | ICD-10-CM | POA: Diagnosis not present

## 2018-06-28 DIAGNOSIS — M5412 Radiculopathy, cervical region: Secondary | ICD-10-CM | POA: Diagnosis not present

## 2018-06-28 DIAGNOSIS — G5603 Carpal tunnel syndrome, bilateral upper limbs: Secondary | ICD-10-CM | POA: Diagnosis not present

## 2018-07-14 DIAGNOSIS — L4050 Arthropathic psoriasis, unspecified: Secondary | ICD-10-CM | POA: Diagnosis not present

## 2018-07-18 DIAGNOSIS — G4733 Obstructive sleep apnea (adult) (pediatric): Secondary | ICD-10-CM | POA: Diagnosis not present

## 2018-08-10 DIAGNOSIS — G43109 Migraine with aura, not intractable, without status migrainosus: Secondary | ICD-10-CM | POA: Diagnosis not present

## 2018-08-10 DIAGNOSIS — M5417 Radiculopathy, lumbosacral region: Secondary | ICD-10-CM | POA: Diagnosis not present

## 2018-08-10 DIAGNOSIS — M5412 Radiculopathy, cervical region: Secondary | ICD-10-CM | POA: Diagnosis not present

## 2018-08-10 DIAGNOSIS — G5603 Carpal tunnel syndrome, bilateral upper limbs: Secondary | ICD-10-CM | POA: Diagnosis not present

## 2018-08-10 DIAGNOSIS — R270 Ataxia, unspecified: Secondary | ICD-10-CM | POA: Diagnosis not present

## 2018-08-10 DIAGNOSIS — G603 Idiopathic progressive neuropathy: Secondary | ICD-10-CM | POA: Diagnosis not present

## 2018-08-12 ENCOUNTER — Other Ambulatory Visit: Payer: Self-pay | Admitting: Specialist

## 2018-08-12 DIAGNOSIS — Z7952 Long term (current) use of systemic steroids: Secondary | ICD-10-CM

## 2018-08-19 ENCOUNTER — Ambulatory Visit
Admission: RE | Admit: 2018-08-19 | Discharge: 2018-08-19 | Disposition: A | Discharge status: Home / Self Care | Payer: Medicare Other | Source: Ambulatory Visit | Attending: Specialist | Admitting: Specialist

## 2018-08-19 DIAGNOSIS — Z7952 Long term (current) use of systemic steroids: Secondary | ICD-10-CM

## 2018-08-19 DIAGNOSIS — M85851 Other specified disorders of bone density and structure, right thigh: Secondary | ICD-10-CM | POA: Diagnosis not present

## 2018-09-12 DIAGNOSIS — L4050 Arthropathic psoriasis, unspecified: Secondary | ICD-10-CM | POA: Diagnosis not present

## 2018-09-12 DIAGNOSIS — Z79899 Other long term (current) drug therapy: Secondary | ICD-10-CM | POA: Diagnosis not present

## 2018-09-13 ENCOUNTER — Telehealth: Payer: Self-pay | Admitting: Pharmacist Clinician (PhC)/ Clinical Pharmacy Specialist

## 2018-09-13 NOTE — Telephone Encounter (Signed)
Patient emailed in home BP readings from past month.  Averaged the readings, 133/77.   Returned email to patient advising to continue without changes

## 2018-09-15 ENCOUNTER — Ambulatory Visit (HOSPITAL_COMMUNITY)
Admission: RE | Admit: 2018-09-15 | Discharge: 2018-09-15 | Disposition: A | Discharge status: Home / Self Care | Payer: Medicare Other | Source: Ambulatory Visit | Attending: Internal Medicine | Admitting: Internal Medicine

## 2018-09-15 DIAGNOSIS — I714 Abdominal aortic aneurysm, without rupture, unspecified: Secondary | ICD-10-CM

## 2018-09-21 DIAGNOSIS — M5442 Lumbago with sciatica, left side: Secondary | ICD-10-CM | POA: Diagnosis not present

## 2018-09-21 DIAGNOSIS — R202 Paresthesia of skin: Secondary | ICD-10-CM | POA: Diagnosis not present

## 2018-09-21 DIAGNOSIS — M5441 Lumbago with sciatica, right side: Secondary | ICD-10-CM | POA: Diagnosis not present

## 2018-09-21 DIAGNOSIS — R270 Ataxia, unspecified: Secondary | ICD-10-CM | POA: Diagnosis not present

## 2018-09-21 DIAGNOSIS — M5412 Radiculopathy, cervical region: Secondary | ICD-10-CM | POA: Diagnosis not present

## 2018-09-21 DIAGNOSIS — G43109 Migraine with aura, not intractable, without status migrainosus: Secondary | ICD-10-CM | POA: Diagnosis not present

## 2018-09-23 ENCOUNTER — Other Ambulatory Visit: Payer: Self-pay | Admitting: Cardiology

## 2018-09-24 DIAGNOSIS — Z23 Encounter for immunization: Secondary | ICD-10-CM | POA: Diagnosis not present

## 2018-10-02 ENCOUNTER — Telehealth: Payer: Self-pay | Admitting: Pharmacist Clinician (PhC)/ Clinical Pharmacy Specialist

## 2018-10-02 DIAGNOSIS — E785 Hyperlipidemia, unspecified: Secondary | ICD-10-CM

## 2018-10-02 NOTE — Telephone Encounter (Signed)
Mailed new PASSapplication and lab order

## 2018-10-14 ENCOUNTER — Telehealth: Payer: Self-pay | Admitting: Pharmacist Clinician (PhC)/ Clinical Pharmacy Specialist

## 2018-10-14 DIAGNOSIS — L720 Epidermal cyst: Secondary | ICD-10-CM | POA: Diagnosis not present

## 2018-10-14 DIAGNOSIS — L570 Actinic keratosis: Secondary | ICD-10-CM | POA: Diagnosis not present

## 2018-10-14 NOTE — Telephone Encounter (Signed)
Patient brought in home BP readings for month of October.  For 30 morning readings his average was 132/74.  Patient continues to take losartan 25-50 mg daily depending on BP readings.  Advised him that readings look good and continue as is.

## 2018-10-17 ENCOUNTER — Other Ambulatory Visit: Payer: Self-pay

## 2018-10-17 DIAGNOSIS — E785 Hyperlipidemia, unspecified: Secondary | ICD-10-CM | POA: Diagnosis not present

## 2018-10-18 LAB — LIPID PANEL
CHOL/HDL RATIO: 2.5 ratio (ref 0.0–5.0)
Cholesterol, Total: 144 mg/dL (ref 100–199)
HDL: 57 mg/dL (ref >39)
LDL CALC: 60 mg/dL (ref 0–99)
TRIGLYCERIDES: 135 mg/dL (ref 0–149)
VLDL Cholesterol Cal: 27 mg/dL (ref 5–40)

## 2018-10-20 ENCOUNTER — Telehealth: Payer: Self-pay | Admitting: Cardiology

## 2018-10-20 NOTE — Telephone Encounter (Signed)
Pt aware of his blood work  

## 2018-10-20 NOTE — Telephone Encounter (Signed)
Follow Up:      Returning your call from yesterday, concerning his lab results.

## 2018-10-21 DIAGNOSIS — N138 Other obstructive and reflux uropathy: Secondary | ICD-10-CM | POA: Diagnosis not present

## 2018-10-21 DIAGNOSIS — N529 Male erectile dysfunction, unspecified: Secondary | ICD-10-CM | POA: Diagnosis not present

## 2018-10-21 DIAGNOSIS — N401 Enlarged prostate with lower urinary tract symptoms: Secondary | ICD-10-CM | POA: Diagnosis not present

## 2018-10-21 DIAGNOSIS — R5383 Other fatigue: Secondary | ICD-10-CM | POA: Diagnosis not present

## 2018-10-26 DIAGNOSIS — R5383 Other fatigue: Secondary | ICD-10-CM | POA: Diagnosis not present

## 2018-10-26 DIAGNOSIS — N138 Other obstructive and reflux uropathy: Secondary | ICD-10-CM | POA: Diagnosis not present

## 2018-10-26 DIAGNOSIS — N401 Enlarged prostate with lower urinary tract symptoms: Secondary | ICD-10-CM | POA: Diagnosis not present

## 2018-11-07 DIAGNOSIS — L4050 Arthropathic psoriasis, unspecified: Secondary | ICD-10-CM | POA: Diagnosis not present

## 2018-11-28 DIAGNOSIS — M5431 Sciatica, right side: Secondary | ICD-10-CM | POA: Diagnosis not present

## 2018-11-28 DIAGNOSIS — M9902 Segmental and somatic dysfunction of thoracic region: Secondary | ICD-10-CM | POA: Diagnosis not present

## 2018-11-28 DIAGNOSIS — M9903 Segmental and somatic dysfunction of lumbar region: Secondary | ICD-10-CM | POA: Diagnosis not present

## 2018-12-02 DIAGNOSIS — M76822 Posterior tibial tendinitis, left leg: Secondary | ICD-10-CM | POA: Diagnosis not present

## 2018-12-02 DIAGNOSIS — M7752 Other enthesopathy of left foot: Secondary | ICD-10-CM | POA: Diagnosis not present

## 2018-12-05 DIAGNOSIS — M9903 Segmental and somatic dysfunction of lumbar region: Secondary | ICD-10-CM | POA: Diagnosis not present

## 2018-12-05 DIAGNOSIS — M5431 Sciatica, right side: Secondary | ICD-10-CM | POA: Diagnosis not present

## 2018-12-05 DIAGNOSIS — M9902 Segmental and somatic dysfunction of thoracic region: Secondary | ICD-10-CM | POA: Diagnosis not present

## 2018-12-08 DIAGNOSIS — M79671 Pain in right foot: Secondary | ICD-10-CM | POA: Diagnosis not present

## 2018-12-08 DIAGNOSIS — L4050 Arthropathic psoriasis, unspecified: Secondary | ICD-10-CM | POA: Diagnosis not present

## 2018-12-08 DIAGNOSIS — Z6827 Body mass index (BMI) 27.0-27.9, adult: Secondary | ICD-10-CM | POA: Diagnosis not present

## 2018-12-08 DIAGNOSIS — L409 Psoriasis, unspecified: Secondary | ICD-10-CM | POA: Diagnosis not present

## 2018-12-08 DIAGNOSIS — E663 Overweight: Secondary | ICD-10-CM | POA: Diagnosis not present

## 2018-12-08 DIAGNOSIS — M150 Primary generalized (osteo)arthritis: Secondary | ICD-10-CM | POA: Diagnosis not present

## 2018-12-08 DIAGNOSIS — M5136 Other intervertebral disc degeneration, lumbar region: Secondary | ICD-10-CM | POA: Diagnosis not present

## 2018-12-08 DIAGNOSIS — M545 Low back pain: Secondary | ICD-10-CM | POA: Diagnosis not present

## 2018-12-09 DIAGNOSIS — M76822 Posterior tibial tendinitis, left leg: Secondary | ICD-10-CM | POA: Diagnosis not present

## 2018-12-12 DIAGNOSIS — M5431 Sciatica, right side: Secondary | ICD-10-CM | POA: Diagnosis not present

## 2018-12-12 DIAGNOSIS — M9902 Segmental and somatic dysfunction of thoracic region: Secondary | ICD-10-CM | POA: Diagnosis not present

## 2018-12-12 DIAGNOSIS — M9903 Segmental and somatic dysfunction of lumbar region: Secondary | ICD-10-CM | POA: Diagnosis not present

## 2018-12-13 DIAGNOSIS — M5431 Sciatica, right side: Secondary | ICD-10-CM | POA: Diagnosis not present

## 2018-12-13 DIAGNOSIS — M9903 Segmental and somatic dysfunction of lumbar region: Secondary | ICD-10-CM | POA: Diagnosis not present

## 2018-12-13 DIAGNOSIS — M9902 Segmental and somatic dysfunction of thoracic region: Secondary | ICD-10-CM | POA: Diagnosis not present

## 2018-12-15 DIAGNOSIS — M9902 Segmental and somatic dysfunction of thoracic region: Secondary | ICD-10-CM | POA: Diagnosis not present

## 2018-12-15 DIAGNOSIS — M5431 Sciatica, right side: Secondary | ICD-10-CM | POA: Diagnosis not present

## 2018-12-15 DIAGNOSIS — M9903 Segmental and somatic dysfunction of lumbar region: Secondary | ICD-10-CM | POA: Diagnosis not present

## 2018-12-16 DIAGNOSIS — M71572 Other bursitis, not elsewhere classified, left ankle and foot: Secondary | ICD-10-CM | POA: Diagnosis not present

## 2018-12-16 DIAGNOSIS — M76822 Posterior tibial tendinitis, left leg: Secondary | ICD-10-CM | POA: Diagnosis not present

## 2018-12-19 DIAGNOSIS — M9902 Segmental and somatic dysfunction of thoracic region: Secondary | ICD-10-CM | POA: Diagnosis not present

## 2018-12-19 DIAGNOSIS — M5431 Sciatica, right side: Secondary | ICD-10-CM | POA: Diagnosis not present

## 2018-12-19 DIAGNOSIS — M9903 Segmental and somatic dysfunction of lumbar region: Secondary | ICD-10-CM | POA: Diagnosis not present

## 2018-12-21 DIAGNOSIS — M9902 Segmental and somatic dysfunction of thoracic region: Secondary | ICD-10-CM | POA: Diagnosis not present

## 2018-12-21 DIAGNOSIS — M9903 Segmental and somatic dysfunction of lumbar region: Secondary | ICD-10-CM | POA: Diagnosis not present

## 2018-12-21 DIAGNOSIS — M5431 Sciatica, right side: Secondary | ICD-10-CM | POA: Diagnosis not present

## 2018-12-26 DIAGNOSIS — M5431 Sciatica, right side: Secondary | ICD-10-CM | POA: Diagnosis not present

## 2018-12-26 DIAGNOSIS — M9902 Segmental and somatic dysfunction of thoracic region: Secondary | ICD-10-CM | POA: Diagnosis not present

## 2018-12-26 DIAGNOSIS — M9903 Segmental and somatic dysfunction of lumbar region: Secondary | ICD-10-CM | POA: Diagnosis not present

## 2018-12-28 DIAGNOSIS — M9902 Segmental and somatic dysfunction of thoracic region: Secondary | ICD-10-CM | POA: Diagnosis not present

## 2018-12-28 DIAGNOSIS — M5431 Sciatica, right side: Secondary | ICD-10-CM | POA: Diagnosis not present

## 2018-12-28 DIAGNOSIS — M9903 Segmental and somatic dysfunction of lumbar region: Secondary | ICD-10-CM | POA: Diagnosis not present

## 2018-12-29 DIAGNOSIS — M9902 Segmental and somatic dysfunction of thoracic region: Secondary | ICD-10-CM | POA: Diagnosis not present

## 2018-12-29 DIAGNOSIS — M9903 Segmental and somatic dysfunction of lumbar region: Secondary | ICD-10-CM | POA: Diagnosis not present

## 2018-12-29 DIAGNOSIS — M5431 Sciatica, right side: Secondary | ICD-10-CM | POA: Diagnosis not present

## 2019-01-02 DIAGNOSIS — L4050 Arthropathic psoriasis, unspecified: Secondary | ICD-10-CM | POA: Diagnosis not present

## 2019-01-03 DIAGNOSIS — M4180 Other forms of scoliosis, site unspecified: Secondary | ICD-10-CM | POA: Diagnosis not present

## 2019-01-03 DIAGNOSIS — M545 Low back pain: Secondary | ICD-10-CM | POA: Diagnosis not present

## 2019-01-11 DIAGNOSIS — M4150 Other secondary scoliosis, site unspecified: Secondary | ICD-10-CM | POA: Diagnosis not present

## 2019-01-11 DIAGNOSIS — M6281 Muscle weakness (generalized): Secondary | ICD-10-CM | POA: Diagnosis not present

## 2019-01-16 DIAGNOSIS — M6281 Muscle weakness (generalized): Secondary | ICD-10-CM | POA: Diagnosis not present

## 2019-01-16 DIAGNOSIS — M4150 Other secondary scoliosis, site unspecified: Secondary | ICD-10-CM | POA: Diagnosis not present

## 2019-01-19 DIAGNOSIS — G4733 Obstructive sleep apnea (adult) (pediatric): Secondary | ICD-10-CM | POA: Diagnosis not present

## 2019-01-20 DIAGNOSIS — M4150 Other secondary scoliosis, site unspecified: Secondary | ICD-10-CM | POA: Diagnosis not present

## 2019-01-20 DIAGNOSIS — M6281 Muscle weakness (generalized): Secondary | ICD-10-CM | POA: Diagnosis not present

## 2019-01-24 DIAGNOSIS — M6281 Muscle weakness (generalized): Secondary | ICD-10-CM | POA: Diagnosis not present

## 2019-01-24 DIAGNOSIS — M4150 Other secondary scoliosis, site unspecified: Secondary | ICD-10-CM | POA: Diagnosis not present

## 2019-01-27 DIAGNOSIS — M4150 Other secondary scoliosis, site unspecified: Secondary | ICD-10-CM | POA: Diagnosis not present

## 2019-01-27 DIAGNOSIS — M6281 Muscle weakness (generalized): Secondary | ICD-10-CM | POA: Diagnosis not present

## 2019-01-31 DIAGNOSIS — M4150 Other secondary scoliosis, site unspecified: Secondary | ICD-10-CM | POA: Diagnosis not present

## 2019-01-31 DIAGNOSIS — M6281 Muscle weakness (generalized): Secondary | ICD-10-CM | POA: Diagnosis not present

## 2019-02-03 DIAGNOSIS — M6281 Muscle weakness (generalized): Secondary | ICD-10-CM | POA: Diagnosis not present

## 2019-02-03 DIAGNOSIS — M4150 Other secondary scoliosis, site unspecified: Secondary | ICD-10-CM | POA: Diagnosis not present

## 2019-02-07 DIAGNOSIS — M6281 Muscle weakness (generalized): Secondary | ICD-10-CM | POA: Diagnosis not present

## 2019-02-07 DIAGNOSIS — G629 Polyneuropathy, unspecified: Secondary | ICD-10-CM | POA: Diagnosis not present

## 2019-02-07 DIAGNOSIS — M4150 Other secondary scoliosis, site unspecified: Secondary | ICD-10-CM | POA: Diagnosis not present

## 2019-02-07 DIAGNOSIS — L4050 Arthropathic psoriasis, unspecified: Secondary | ICD-10-CM | POA: Diagnosis not present

## 2019-02-07 DIAGNOSIS — I10 Essential (primary) hypertension: Secondary | ICD-10-CM | POA: Diagnosis not present

## 2019-02-08 ENCOUNTER — Telehealth: Payer: Self-pay

## 2019-02-08 NOTE — Telephone Encounter (Signed)
Called the pt to let them know that PASS was approved and gave them the phone # to call to schedule the med delivery

## 2019-02-09 DIAGNOSIS — M4150 Other secondary scoliosis, site unspecified: Secondary | ICD-10-CM | POA: Diagnosis not present

## 2019-02-09 DIAGNOSIS — L4050 Arthropathic psoriasis, unspecified: Secondary | ICD-10-CM | POA: Diagnosis not present

## 2019-02-09 DIAGNOSIS — G629 Polyneuropathy, unspecified: Secondary | ICD-10-CM | POA: Diagnosis not present

## 2019-02-09 DIAGNOSIS — M6281 Muscle weakness (generalized): Secondary | ICD-10-CM | POA: Diagnosis not present

## 2019-02-09 DIAGNOSIS — I10 Essential (primary) hypertension: Secondary | ICD-10-CM | POA: Diagnosis not present

## 2019-02-17 DIAGNOSIS — G629 Polyneuropathy, unspecified: Secondary | ICD-10-CM | POA: Diagnosis not present

## 2019-02-17 DIAGNOSIS — L4050 Arthropathic psoriasis, unspecified: Secondary | ICD-10-CM | POA: Diagnosis not present

## 2019-02-17 DIAGNOSIS — M6281 Muscle weakness (generalized): Secondary | ICD-10-CM | POA: Diagnosis not present

## 2019-02-17 DIAGNOSIS — M4150 Other secondary scoliosis, site unspecified: Secondary | ICD-10-CM | POA: Diagnosis not present

## 2019-02-17 DIAGNOSIS — I10 Essential (primary) hypertension: Secondary | ICD-10-CM | POA: Diagnosis not present

## 2019-02-21 DIAGNOSIS — M76822 Posterior tibial tendinitis, left leg: Secondary | ICD-10-CM | POA: Diagnosis not present

## 2019-02-27 DIAGNOSIS — L4050 Arthropathic psoriasis, unspecified: Secondary | ICD-10-CM | POA: Diagnosis not present

## 2019-02-27 DIAGNOSIS — Z79899 Other long term (current) drug therapy: Secondary | ICD-10-CM | POA: Diagnosis not present

## 2019-03-01 ENCOUNTER — Other Ambulatory Visit: Payer: Self-pay | Admitting: Family Medicine

## 2019-03-01 DIAGNOSIS — E890 Postprocedural hypothyroidism: Secondary | ICD-10-CM

## 2019-03-01 NOTE — Telephone Encounter (Signed)
Can we have him come in to have TSH checked.  I have only seen him once and we didn't address this.

## 2019-03-02 NOTE — Telephone Encounter (Signed)
Called Pt . Per Dr. Rodena Piety , Lab for [facility] needed. Pt is aware and is coming in 02/02/2019 @ 10 am for labs . Need TSH before Rx Refill.

## 2019-03-03 ENCOUNTER — Other Ambulatory Visit (INDEPENDENT_AMBULATORY_CARE_PROVIDER_SITE_OTHER): Payer: Medicare Other

## 2019-03-03 ENCOUNTER — Other Ambulatory Visit: Payer: Self-pay

## 2019-03-03 DIAGNOSIS — E890 Postprocedural hypothyroidism: Secondary | ICD-10-CM | POA: Diagnosis not present

## 2019-03-03 LAB — TSH: TSH: 3.91 u[IU]/mL (ref 0.35–4.50)

## 2019-03-06 MED ORDER — LEVOTHYROXINE SODIUM 125 MCG PO TABS: ORAL_TABLET | ORAL | 1 refills | Status: DC

## 2019-03-06 NOTE — Addendum Note (Signed)
Addended by: Gala Lewandowsky A on: 03/06/2019 10:23 AM   Modules accepted: Orders

## 2019-03-08 ENCOUNTER — Other Ambulatory Visit: Payer: Self-pay | Admitting: Family Medicine

## 2019-03-08 MED ORDER — EZETIMIBE 10 MG PO TABS: 10.0000 mg | ORAL_TABLET | Freq: Every day | ORAL | 1 refills | Status: DC

## 2019-03-20 ENCOUNTER — Other Ambulatory Visit: Payer: Self-pay | Admitting: Family Medicine

## 2019-04-12 ENCOUNTER — Other Ambulatory Visit: Payer: Self-pay | Admitting: Family Medicine

## 2019-04-13 ENCOUNTER — Encounter: Payer: Self-pay | Admitting: Family Medicine

## 2019-04-13 ENCOUNTER — Ambulatory Visit (INDEPENDENT_AMBULATORY_CARE_PROVIDER_SITE_OTHER): Payer: Medicare Other | Admitting: Family Medicine

## 2019-04-13 DIAGNOSIS — M6790 Unspecified disorder of synovium and tendon, unspecified site: Secondary | ICD-10-CM | POA: Diagnosis not present

## 2019-04-13 DIAGNOSIS — M8580 Other specified disorders of bone density and structure, unspecified site: Secondary | ICD-10-CM | POA: Diagnosis not present

## 2019-04-13 MED ORDER — ALENDRONATE SODIUM 70 MG PO TABS: 70.0000 mg | ORAL_TABLET | ORAL | 1 refills | Status: DC

## 2019-04-13 NOTE — Assessment & Plan Note (Signed)
-  Has up to date DEXA -Several risk factors including advanced age, RA and prior fracture -Will continue fosamax, rx renewed.

## 2019-04-13 NOTE — Assessment & Plan Note (Signed)
-  New problem -No triggering or pain at this time, will continue to monitor.

## 2019-04-13 NOTE — Progress Notes (Signed)
Martin Rubio - 73 y.o. male MRN [id number]  Date of birth: Jul 04, 1946   This visit type was conducted due to national recommendations for restrictions regarding the COVID-19 Pandemic (e.g. social distancing).  This format is felt to be most appropriate for this patient at this time.  All issues noted in this document were discussed and addressed.  No physical exam was performed (except for noted visual exam findings with Video Visits).  I discussed the limitations of evaluation and management by telemedicine and the availability of in person appointments. The patient expressed understanding and agreed to proceed.  I connected with@ on 04/13/19 at 11:30 AM EDT by a video enabled telemedicine application and verified that I am speaking with the correct person using two identifiers   Patient Location: Home [place] [address]   Provider location:   [facility]  Chief Complaint  Patient presents with  . Follow-up    meds sodium 75 mg tabs , knot in base of middle finger , Hx of same on other hand.    HPI  Martin Rubio is a 73 y.o. male who presents via audio/video conferencing for a telehealth visit today.  He is following up today for osteopenia. History of compression fracture of the spine in the past as well.   He does have a history of RA and psoriatic arthritis.  He is followed by Dr. Amil Amen for this.  He has been on fosamax for a few years now with last bone density test completed in 2019 with T-score at femoral neck of -1.5.  He is doing well with fosamax.  Denies side effects.  He does sit upright after taking.   He also has noted a nodule to palm of R hand.  Area is non tender and he denies any triggering associated with this.    ROS:  A comprehensive ROS was completed and negative except as noted per HPI  Past Medical History:  Diagnosis Date  . Bronchitis   . Diverticulosis   . FH: BPH (benign prostatic hypertrophy)   . History of Graves' disease  01/19/2018   S/p ablation  . HTN (hypertension)   . Hyperlipidemia   . Obesity   . Psoriatic arthritis ([place])   . Rheumatoid arthritis ([place]) 08/07/2014  . Sleep apnea    CPAP  . Thyroid disease     Past Surgical History:  Procedure Laterality Date  . INGUINAL HERNIA REPAIR     bilateral  . KNEE SURGERY     bilateral arthroscopic  . TRANSURETHRAL RESECTION OF PROSTATE    . UMBILICAL HERNIA REPAIR      Family History  Problem Relation Age of Onset  . CAD Father 47       Died age 59  . CAD Mother 61       CABG  . Diabetes Brother     Social History   Socioeconomic History  . Marital status: Married    Spouse name: Not on file  . Number of children: 2  . Years of education: Not on file  . Highest education level: Not on file  Occupational History  . Occupation: Retired    Fish farm manager: [employer]  . Financial resource strain: Not on file  . Food insecurity:    Worry: Not on file    Inability: Not on file  . Transportation needs:    Medical: Not on file    Non-medical: Not on file  Tobacco Use  .  Smoking status: Former Smoker    Types: Cigarettes  . Smokeless tobacco: Never Used  . Tobacco comment: Quit 30 years ago.  Substance and Sexual Activity  . Alcohol use: No  . Drug use: No  . Sexual activity: Not on file  Lifestyle  . Physical activity:    Days per week: Not on file    Minutes per session: Not on file  . Stress: Not on file  Relationships  . Social connections:    Talks on phone: Not on file    Gets together: Not on file    Attends religious service: Not on file    Active member of club or organization: Not on file    Attends meetings of clubs or organizations: Not on file    Relationship status: Not on file  . Intimate partner violence:    Fear of current or ex partner: Not on file    Emotionally abused: Not on file    Physically abused: Not on file    Forced sexual activity: Not on file  Other Topics Concern  . Not on file   Social History Narrative   Lives with wife.     Current Outpatient Medications:  .  alendronate (FOSAMAX) 70 MG tablet, Take 70 mg by mouth once a week. Take with a full glass of water on an empty stomach., Disp: , Rfl:  .  Alirocumab (PRALUENT) 150 MG/ML SOPN, Inject 150 mg into the skin every 14 (fourteen) days., Disp: 2 pen, Rfl: 11 .  levothyroxine (SYNTHROID, LEVOTHROID) 125 MCG tablet, TAKE 1 TABLET (125 MCG TOTAL) BY MOUTH DAILY ON AN EMPTY STOMACH, Disp: 90 tablet, Rfl: 1 .  losartan (COZAAR) 50 MG tablet, Take 1 tablet (50 mg total) by mouth 2 (two) times daily., Disp: 180 tablet, Rfl: 1 .  tadalafil (CIALIS) 5 MG tablet, Cialis 5 mg tablet, Disp: , Rfl:  .  tamsulosin (FLOMAX) 0.4 MG CAPS capsule, Take by mouth., Disp: , Rfl:  .  fluticasone (FLONASE) 50 MCG/ACT nasal spray, 1 spray., Disp: , Rfl:  .  omeprazole (PRILOSEC) 20 MG capsule, Take 20 mg by mouth 2 (two) times daily before a meal., Disp: , Rfl:   EXAM:  VITALS per patient if applicable: BP 384/66 (Patient Position: Sitting)   Pulse (!) 54   Ht 5\' 10"  (1.778 m)   Wt 198 lb (89.8 kg)   BMI 28.41 kg/m   GENERAL: alert, oriented, appears well and in no acute distress  HEENT: atraumatic, conjunttiva clear, no obvious abnormalities on inspection of external nose and ears  NECK: normal movements of the head and neck  LUNGS: on inspection no signs of respiratory distress, breathing rate appears normal, no obvious gross SOB, gasping or wheezing  CV: no obvious cyanosis  MS: moves all visible extremities without noticeable abnormality  PSYCH/NEURO: pleasant and cooperative, no obvious depression or anxiety, speech and thought processing grossly intact  ASSESSMENT AND PLAN:  Discussed the following assessment and plan:  Low bone mass -Has up to date DEXA -Several risk factors including advanced age, RA and prior fracture -Will continue fosamax, rx renewed.   Nodule of flexor tendon sheath -New problem -No  triggering or pain at this time, will continue to monitor.        I discussed the assessment and treatment plan with the patient. The patient was provided an opportunity to ask questions and all were answered. The patient agreed with the plan and demonstrated an understanding of the instructions.  The patient was advised to call back or seek an in-person evaluation if the symptoms worsen or if the condition fails to improve as anticipated.     [name], DO

## 2019-04-18 ENCOUNTER — Other Ambulatory Visit (HOSPITAL_COMMUNITY): Payer: Self-pay | Admitting: Cardiology

## 2019-04-18 DIAGNOSIS — I723 Aneurysm of iliac artery: Secondary | ICD-10-CM

## 2019-04-24 DIAGNOSIS — L4050 Arthropathic psoriasis, unspecified: Secondary | ICD-10-CM | POA: Diagnosis not present

## 2019-05-10 ENCOUNTER — Other Ambulatory Visit: Payer: Self-pay | Admitting: Pharmacist

## 2019-05-10 MED ORDER — ALIROCUMAB 150 MG/ML ~~LOC~~ SOAJ: 150.0000 mg | SUBCUTANEOUS | 3 refills | Status: DC

## 2019-05-10 NOTE — Telephone Encounter (Signed)
PASS needs new Rx for Praluent 150mg 

## 2019-05-15 ENCOUNTER — Other Ambulatory Visit: Payer: Self-pay

## 2019-05-15 MED ORDER — ALIROCUMAB 150 MG/ML ~~LOC~~ SOAJ: 150.0000 mg | SUBCUTANEOUS | 3 refills | Status: DC

## 2019-06-16 DIAGNOSIS — M79671 Pain in right foot: Secondary | ICD-10-CM | POA: Diagnosis not present

## 2019-06-16 DIAGNOSIS — M150 Primary generalized (osteo)arthritis: Secondary | ICD-10-CM | POA: Diagnosis not present

## 2019-06-16 DIAGNOSIS — E663 Overweight: Secondary | ICD-10-CM | POA: Diagnosis not present

## 2019-06-16 DIAGNOSIS — M5136 Other intervertebral disc degeneration, lumbar region: Secondary | ICD-10-CM | POA: Diagnosis not present

## 2019-06-16 DIAGNOSIS — L4050 Arthropathic psoriasis, unspecified: Secondary | ICD-10-CM | POA: Diagnosis not present

## 2019-06-16 DIAGNOSIS — Z6829 Body mass index (BMI) 29.0-29.9, adult: Secondary | ICD-10-CM | POA: Diagnosis not present

## 2019-06-16 DIAGNOSIS — L409 Psoriasis, unspecified: Secondary | ICD-10-CM | POA: Diagnosis not present

## 2019-06-16 DIAGNOSIS — M545 Low back pain: Secondary | ICD-10-CM | POA: Diagnosis not present

## 2019-06-19 DIAGNOSIS — L4050 Arthropathic psoriasis, unspecified: Secondary | ICD-10-CM | POA: Diagnosis not present

## 2019-08-15 DIAGNOSIS — L4050 Arthropathic psoriasis, unspecified: Secondary | ICD-10-CM | POA: Diagnosis not present

## 2019-08-15 DIAGNOSIS — Z79899 Other long term (current) drug therapy: Secondary | ICD-10-CM | POA: Diagnosis not present

## 2019-08-16 DIAGNOSIS — M7752 Other enthesopathy of left foot: Secondary | ICD-10-CM | POA: Diagnosis not present

## 2019-08-16 DIAGNOSIS — M67372 Transient synovitis, left ankle and foot: Secondary | ICD-10-CM | POA: Diagnosis not present

## 2019-08-16 DIAGNOSIS — M76822 Posterior tibial tendinitis, left leg: Secondary | ICD-10-CM | POA: Diagnosis not present

## 2019-08-22 DIAGNOSIS — L2089 Other atopic dermatitis: Secondary | ICD-10-CM | POA: Diagnosis not present

## 2019-08-22 DIAGNOSIS — D225 Melanocytic nevi of trunk: Secondary | ICD-10-CM | POA: Diagnosis not present

## 2019-08-22 DIAGNOSIS — D1801 Hemangioma of skin and subcutaneous tissue: Secondary | ICD-10-CM | POA: Diagnosis not present

## 2019-08-22 DIAGNOSIS — B351 Tinea unguium: Secondary | ICD-10-CM | POA: Diagnosis not present

## 2019-08-22 DIAGNOSIS — L821 Other seborrheic keratosis: Secondary | ICD-10-CM | POA: Diagnosis not present

## 2019-08-22 DIAGNOSIS — L570 Actinic keratosis: Secondary | ICD-10-CM | POA: Diagnosis not present

## 2019-08-23 DIAGNOSIS — M19072 Primary osteoarthritis, left ankle and foot: Secondary | ICD-10-CM | POA: Diagnosis not present

## 2019-08-23 DIAGNOSIS — M7752 Other enthesopathy of left foot: Secondary | ICD-10-CM | POA: Diagnosis not present

## 2019-08-25 DIAGNOSIS — H04123 Dry eye syndrome of bilateral lacrimal glands: Secondary | ICD-10-CM | POA: Diagnosis not present

## 2019-08-25 DIAGNOSIS — H2513 Age-related nuclear cataract, bilateral: Secondary | ICD-10-CM | POA: Diagnosis not present

## 2019-09-07 ENCOUNTER — Other Ambulatory Visit: Payer: Self-pay | Admitting: Family Medicine

## 2019-09-07 DIAGNOSIS — E890 Postprocedural hypothyroidism: Secondary | ICD-10-CM

## 2019-09-14 ENCOUNTER — Telehealth: Payer: Self-pay | Admitting: Cardiology

## 2019-09-14 DIAGNOSIS — E785 Hyperlipidemia, unspecified: Secondary | ICD-10-CM | POA: Insufficient documentation

## 2019-09-14 NOTE — Telephone Encounter (Addendum)
° °  Please call patient at [phone]   STAT if HR is under 50 or over 120 (normal HR is 60-100 beats per minute)  1) What is your heart rate? 53  2) Do you have a log of your heart rate readings (document readings)? BP 136/77   3) Do you have any other symptoms? DIZZINESS

## 2019-09-14 NOTE — Telephone Encounter (Signed)
Spoke with patient an he has been feeling lightheaded last few days. Patient HR normally in the low 50's and today BP 118/77 HR 48, last night 41. Had patient recheck and and BP 136/77 HR 53. Scheduled appointment for patient to see Dr [name] tomorrow. Advised if worse go to ED, patient verbalized understanding.

## 2019-09-14 NOTE — Telephone Encounter (Signed)
Agree 

## 2019-09-14 NOTE — Telephone Encounter (Signed)
New message:    Patient calling stating that his BP running low and he is dizzy. Patient would like to speak with someone and may need to see doctor soon.

## 2019-09-14 NOTE — Progress Notes (Signed)
HPI The patient returns for follow up of HTN.  His last echo in Nov of 2016 demonstrated no septal hypertrophy.  He has had problems with BP and with self medication secondary to medication tolerance issues.  He wore a 24 hour BP monitor and has BP was not well controlled.  He has however not wanted to take low dose Norvasc and doses the Cozaar if his SBP is greater than 150.      He called to be added to the schedule because he was feeling very dizzy.  This has been going on for a few weeks.  Looking at his blood pressure monitor he has been getting some reports of an irregular heart rate probably over the last month or so.  He is in atrial fibrillation today which seems to be new.  He does not feel any tachypalpitations.  He would notice that his heart rate is going fast or slow.  However, he just feels dizzy.  He presented with presyncopal.  He has to be careful.  He still doing some walking.  He is not had any orthostatic symptoms.  He is not had any chest pressure, neck or arm discomfort.  He is probably had some mild increased shortness of breath with exertion.  He has had no PND or orthopnea.  He feels a little chest tightness may get short of breath when he is trying to walk up an incline.  He is not having any resting chest pressure, neck or arm discomfort.  Allergies  Allergen Reactions  . Codeine     Tension, nasty feeling   . Nsaids Other (See Comments)    Renal insufficiency  . Prednisone Swelling  . Statins Other (See Comments)    Joint pain Joint pain Joint pain Joint pain Joint pain   . Sulfa Antibiotics Other (See Comments)    Current Outpatient Medications  Medication Sig Dispense Refill  . alendronate (FOSAMAX) 70 MG tablet Take 1 tablet (70 mg total) by mouth once a week. Take with a full glass of water on an empty stomach. 26 tablet 1  . Alirocumab (PRALUENT) 150 MG/ML SOAJ Inject 150 mg into the skin every 14 (fourteen) days. 6 pen 3  . fluticasone (FLONASE)  50 MCG/ACT nasal spray 1 spray.    Marland Kitchen golimumab 2 mg/kg in sodium chloride 0.9 % Inject 2 mg/kg into the vein every 8 (eight) weeks.    Marland Kitchen levothyroxine (SYNTHROID) 125 MCG tablet TAKE 1 TABLET (125 MCG TOTAL) BY MOUTH DAILY ON AN EMPTY STOMACH 90 tablet 1  . losartan (COZAAR) 50 MG tablet Take 1 tablet (50 mg total) by mouth 2 (two) times daily. 180 tablet 1  . tadalafil (CIALIS) 5 MG tablet daily as needed.     . tamsulosin (FLOMAX) 0.4 MG CAPS capsule Take by mouth.    Marland Kitchen apixaban (ELIQUIS) 5 MG TABS tablet Take 1 tablet (5 mg total) by mouth 2 (two) times daily. 180 tablet 3   No current facility-administered medications for this visit.     Past Medical History:  Diagnosis Date  . Bronchitis   . Diverticulosis   . FH: BPH (benign prostatic hypertrophy)   . History of Graves' disease 01/19/2018   S/p ablation  . HTN (hypertension)   . Hyperlipidemia   . Obesity   . Psoriatic arthritis ([place])   . Rheumatoid arthritis ([place]) 08/07/2014  . Sleep apnea    CPAP  . Thyroid disease     Past  Surgical History:  Procedure Laterality Date  . INGUINAL HERNIA REPAIR     bilateral  . KNEE SURGERY     bilateral arthroscopic  . TRANSURETHRAL RESECTION OF PROSTATE    . UMBILICAL HERNIA REPAIR      ROS:   Positive for reflux and other discomfort previously related to ulcerative disease.   Otherwise as stated in the HPI and negative for all other systems.   PHYSICAL EXAM BP (!) 143/83   Pulse 60   Ht 5\' 10"  (1.778 m)   Wt 211 lb (95.7 kg)   SpO2 99%   BMI 30.28 kg/m   GENERAL:  Well appearing NECK:  No jugular venous distention, waveform within normal limits, carotid upstroke brisk and symmetric, no bruits, no thyromegaly LUNGS:  Clear to auscultation bilaterally CHEST:  Unremarkable HEART:  PMI not displaced or sustained,S1 and S2 within normal limits, no S3, no clicks, no rubs, no murmurs, irregular.   ABD:  Flat, positive bowel sounds normal in frequency in pitch, no bruits, no  rebound, no guarding, no midline pulsatile mass, no hepatomegaly, no splenomegaly EXT:  2 plus pulses throughout, no edema, no cyanosis no clubbing NEURO:  Nonfocal   Lab Results  Component Value Date   CHOL 144 10/17/2018   TRIG 135 10/17/2018   HDL 57 10/17/2018   LDLCALC 60 10/17/2018    EKG:   ATRIAL fibrillation, rate 60, junctional escape beat noted, left ventricular [place], left axis deviation, nonspecific T wave flattening, left axis deviation, repolarization ST changes.  No change from previous. 09/15/2019   ASSESSMENT AND PLAN  Atrial fib This is a new problem and may be related to his dizziness.  He has a slow ventricular response and probably has some tachybradycardia.  He does not have any absolute contraindication to anticoagulation though he has some ulcers that are going to be evaluated in a few weeks with an EGD.  He should continue with this work-up.  He does not have any active bleeding however.  Check a CBC today.  I am going to start Eliquis 5 mg twice daily and will check his renal function to make sure this is the right dose.  I had a long discussion with patient and his wife about the risks benefits and indications for blood thinners.  They know to look out for any bleeding in the bowel movements.  I am to check another CBC in about 2 weeks.  He is to let me know what GI says he should not be taking this but as of now there are no contraindications.  I think he is going to need cardioversion after he has been on anticoagulation for 3 weeks.  Again I have him wear a 48-hour Holter monitor to make sure he has permanent atrial fibrillation.   [name] has a CHA2DS2 - VASc score of 3.       Left common iliac anuerysm He had stable aneurysms in October last year.  He will need follow up.  Next year.  HTN The blood pressure is slightly labile.   He is been hesitant to take medicines as previously prescribed and for now not to make any changes.   Septal  hypertrophy I will be checking echocardiogram to follow this and to follow his fibrillation.   Dyslipidemia He is being managed in the lipid clinic with PCSK9 inhibitor.     Obesity We discussed weight loss with diet and exercise.  For now I have asked him to  hold off on exercising given his dizziness.

## 2019-09-15 ENCOUNTER — Other Ambulatory Visit: Payer: Self-pay

## 2019-09-15 ENCOUNTER — Ambulatory Visit (INDEPENDENT_AMBULATORY_CARE_PROVIDER_SITE_OTHER): Payer: Medicare Other | Admitting: Cardiology

## 2019-09-15 ENCOUNTER — Telehealth: Payer: Self-pay

## 2019-09-15 ENCOUNTER — Encounter: Payer: Self-pay | Admitting: Cardiology

## 2019-09-15 VITALS — BP 143/83 | HR 60 | Ht 70.0 in | Wt 211.0 lb

## 2019-09-15 DIAGNOSIS — I4891 Unspecified atrial fibrillation: Secondary | ICD-10-CM | POA: Diagnosis not present

## 2019-09-15 DIAGNOSIS — E785 Hyperlipidemia, unspecified: Secondary | ICD-10-CM | POA: Diagnosis not present

## 2019-09-15 DIAGNOSIS — I723 Aneurysm of iliac artery: Secondary | ICD-10-CM

## 2019-09-15 DIAGNOSIS — I10 Essential (primary) hypertension: Secondary | ICD-10-CM

## 2019-09-15 DIAGNOSIS — I422 Other hypertrophic cardiomyopathy: Secondary | ICD-10-CM | POA: Diagnosis not present

## 2019-09-15 DIAGNOSIS — Z01812 Encounter for preprocedural laboratory examination: Secondary | ICD-10-CM

## 2019-09-15 DIAGNOSIS — R2681 Unsteadiness on feet: Secondary | ICD-10-CM | POA: Diagnosis not present

## 2019-09-15 MED ORDER — APIXABAN 5 MG PO TABS: 5.0000 mg | ORAL_TABLET | Freq: Two times a day (BID) | ORAL | 3 refills | Status: DC

## 2019-09-15 NOTE — Patient Instructions (Signed)
Medication Instructions:  Start taking 5mg  Eliquis twice daily.   If you need a refill on your cardiac medications before your next appointment, please call your pharmacy.   Lab work: TSH, CBC, BMET If you have labs (blood work) drawn today and your tests are completely normal, you will receive your results only by: [place] (if you have MyChart) OR A paper copy in the mail If you have any lab test that is abnormal or we need to change your treatment, we will call you to review the results.  Testing/Procedures: Your physician has requested that you have an echocardiogram. Echocardiography is a painless test that uses sound waves to create images of your heart. It provides your doctor with information about the size and shape of your heart and how well your heart's chambers and valves are working. This procedure takes approximately one hour. There are no restrictions for this procedure. [place] has recommended that you wear a 3 day event monitor. Event monitors are medical devices that record the heart's electrical activity. Doctors most often [place] these monitors to diagnose arrhythmias. Arrhythmias are problems with the speed or rhythm of the heartbeat. The monitor is a small, portable device. You can wear one while you do your normal daily activities. This is usually used to diagnose what is causing palpitations/syncope (passing out).   Follow-Up: After the cardioversion.    Any Other Special Instructions Will Be Listed Below (If Applicable).  Ambulatory Cardiac Monitoring An ambulatory cardiac monitor is a small recording device that is used to detect abnormal heart rhythms (arrhythmias). Most monitors are connected by wires to flat, sticky disks (electrodes) that are then attached to your chest. You may need to wear a monitor if you have had symptoms such as:  Fast heartbeats (palpitations).  Dizziness.  Fainting or light-headedness.   Unexplained weakness.  Shortness of breath. There are several types of monitors. Some common monitors include:  Holter monitor. This records your heart rhythm continuously, usually for 24-48 hours.  Event (episodic) monitor. This monitor has a symptoms button, and when pushed, it will begin recording. You need to activate this monitor to record when you have a heart-related symptom.  Automatic detection monitor. This monitor will begin recording when it detects an abnormal heartbeat. What are the risks? Generally, these devices are safe to use. However, it is possible that the skin under the electrodes will become irritated. How to prepare for monitoring Your health care provider will prepare your chest for the electrode placement and show you how to use the monitor.  Do not apply lotions to your chest before monitoring.  Follow directions on how to care for the monitor, and how to return the monitor when the testing period is complete. How to use your cardiac monitor  Follow directions about how long to wear the monitor, and if you can take the monitor off in order to shower or bathe. ? Do not let the monitor get wet. ? Do not bathe, swim, or use a hot tub while wearing the monitor.  Keep your skin clean. Do not put body lotion or moisturizer on your chest.  Change the electrodes as told by your health care provider, or any time they stop sticking to your skin. You may need to use medical tape to keep them on.  Try to put the electrodes in slightly different places on your chest to help prevent skin irritation. Follow directions from your health care provider  about where to place the electrodes.  Make sure the monitor is safely clipped to your clothing or in a location close to your body as recommended by your health care provider.  If your monitor has a symptoms button, press the button to mark an event as soon as you feel a heart-related symptom, such as: ? Dizziness. ? Weakness.  ? Light-headedness. ? Palpitations. ? Thumping or pounding in your chest. ? Shortness of breath. ? Unexplained weakness.  Keep a diary of your activities, such as walking, doing chores, and taking medicine. It is very important to note what you were doing when you pushed the button to record your symptoms. This will help your health care provider determine what might be contributing to your symptoms.  Send the recorded information as recommended by your health care provider. It may take some time for your health care provider to process the results.  Change the batteries as told by your health care provider.  Keep electronic devices away from your monitor. These include: ? Tablets. ? MP3 players. ? Cell phones.  While wearing your monitor you should avoid: ? Electric blankets. ? Armed forces operational officer. ? Electric toothbrushes. ? Microwave ovens. ? Magnets. ? Metal detectors. Get help right away if:  You have chest pain.  You have shortness of breath or extreme difficulty breathing.  You develop a very fast heartbeat that does not get better.  You develop dizziness that does not go away.  You faint or constantly feel like you are about to faint. Summary  An ambulatory cardiac monitor is a small recording device that is used to detect abnormal heart rhythms (arrhythmias).  Make sure you understand how to send the information from the monitor to your health care provider.  It is important to press the button on the monitor when you have any heart-related symptoms.  Keep a diary of your activities, such as walking, doing chores, and taking medicine. It is very important to note what you were doing when you pushed the button to record your symptoms. This will help your health care provider learn what might be causing your symptoms. This information is not intended to replace advice given to you by your health care provider. Make sure you discuss any questions you have with your  health care provider. Document Released: 09/01/2008 Document Revised: 11/05/2017 Document Reviewed: 11/07/2016 Elsevier Patient Education  [place].   Echocardiogram An echocardiogram is a procedure that uses painless sound waves (ultrasound) to produce an image of the heart. Images from an echocardiogram can provide important information about:  Signs of coronary artery disease (CAD).  Aneurysm detection. An aneurysm is a weak or damaged part of an artery wall that bulges out from the normal force of blood pumping through the body.  Heart size and shape. Changes in the size or shape of the heart can be associated with certain conditions, including heart failure, aneurysm, and CAD.  Heart muscle function.  Heart valve function.  Signs of a past heart attack.  Fluid buildup around the heart.  Thickening of the heart muscle.  A tumor or infectious growth around the heart valves. Tell a health care provider about:  Any allergies you have.  All medicines you are taking, including vitamins, herbs, eye drops, creams, and over-the-counter medicines.  Any blood disorders you have.  Any surgeries you have had.  Any medical conditions you have.  Whether you are pregnant or may be pregnant. What are the risks? Generally, this is a  safe procedure. However, problems may occur, including:  Allergic reaction to dye (contrast) that may be used during the procedure. What happens before the procedure? No specific preparation is needed. You may eat and drink normally. What happens during the procedure?   An IV tube may be inserted into one of your veins.  You may receive contrast through this tube. A contrast is an injection that improves the quality of the pictures from your heart.  A gel will be applied to your chest.  A wand-like tool (transducer) will be moved over your chest. The gel will help to transmit the sound waves from the transducer.  The sound waves will  harmlessly bounce off of your heart to allow the heart images to be captured in real-time motion. The images will be recorded on a computer. The procedure may vary among health care providers and hospitals. What happens after the procedure?  You may return to your normal, everyday life, including diet, activities, and medicines, unless your health care provider tells you not to do that. Summary  An echocardiogram is a procedure that uses painless sound waves (ultrasound) to produce an image of the heart.  Images from an echocardiogram can provide important information about the size and shape of your heart, heart muscle function, heart valve function, and fluid buildup around your heart.  You do not need to do anything to prepare before this procedure. You may eat and drink normally.  After the echocardiogram is completed, you may return to your normal, everyday life, unless your health care provider tells you not to do that. This information is not intended to replace advice given to you by your health care provider. Make sure you discuss any questions you have with your health care provider. Document Released: 11/20/2000 Document Revised: 03/16/2019 Document Reviewed: 12/26/2016 Elsevier Patient Education  2020 [employer].

## 2019-09-15 NOTE — Telephone Encounter (Signed)
Pt needs f/u appt with Hochrein after cardioversion 11/2.

## 2019-09-16 LAB — BASIC METABOLIC PANEL
BUN/Creatinine Ratio: 8 — ABNORMAL LOW (ref 10–24)
BUN: 14 mg/dL (ref 8–27)
CO2: 23 mmol/L (ref 20–29)
Calcium: 9.6 mg/dL (ref 8.6–10.2)
Chloride: 103 mmol/L (ref 96–106)
Creatinine, Ser: 1.67 mg/dL — ABNORMAL HIGH (ref 0.76–1.27)
GFR calc Af Amer: 46 mL/min/{1.73_m2} — ABNORMAL LOW (ref >59)
GFR calc non Af Amer: 40 mL/min/{1.73_m2} — ABNORMAL LOW (ref >59)
Glucose: 83 mg/dL (ref 65–99)
Potassium: 4.6 mmol/L (ref 3.5–5.2)
Sodium: 143 mmol/L (ref 134–144)

## 2019-09-16 LAB — TSH: TSH: 1.87 u[IU]/mL (ref 0.450–4.500)

## 2019-09-16 LAB — CBC
Hematocrit: 43.9 % (ref 37.5–51.0)
Hemoglobin: 13.5 g/dL (ref 13.0–17.7)
MCH: 22.7 pg — ABNORMAL LOW (ref 26.6–33.0)
MCHC: 30.8 g/dL — ABNORMAL LOW (ref 31.5–35.7)
MCV: 74 fL — ABNORMAL LOW (ref 79–97)
Platelets: 227 10*3/uL (ref 150–450)
RBC: 5.96 x10E6/uL — ABNORMAL HIGH (ref 4.14–5.80)
RDW: 16.6 % — ABNORMAL HIGH (ref 11.6–15.4)
WBC: 5.9 10*3/uL (ref 3.4–10.8)

## 2019-09-18 NOTE — Telephone Encounter (Signed)
Called patient to schedule follow up and go over cardioversion information. He was not able to talk, he will call back tomorrow discuss

## 2019-09-19 ENCOUNTER — Telehealth: Payer: Self-pay | Admitting: *Deleted

## 2019-09-19 NOTE — Telephone Encounter (Signed)
3 day ZIO XT long term holter monitor to be mailed to the patients home.  Instructions reviewed briefly as they are included in the monitor kit.  Do not apply monitor until after you echocardiogram on 09/25/19.  Patch will be in the way for the ultrasound tech.  This patch cannot be removed and re-applied.  [employer] will not supply you with a second patch/recorder.

## 2019-09-20 DIAGNOSIS — M79671 Pain in right foot: Secondary | ICD-10-CM | POA: Diagnosis not present

## 2019-09-20 DIAGNOSIS — Z6830 Body mass index (BMI) 30.0-30.9, adult: Secondary | ICD-10-CM | POA: Diagnosis not present

## 2019-09-20 DIAGNOSIS — M545 Low back pain: Secondary | ICD-10-CM | POA: Diagnosis not present

## 2019-09-20 DIAGNOSIS — M5136 Other intervertebral disc degeneration, lumbar region: Secondary | ICD-10-CM | POA: Diagnosis not present

## 2019-09-20 DIAGNOSIS — L409 Psoriasis, unspecified: Secondary | ICD-10-CM | POA: Diagnosis not present

## 2019-09-20 DIAGNOSIS — E663 Overweight: Secondary | ICD-10-CM | POA: Diagnosis not present

## 2019-09-20 DIAGNOSIS — L4050 Arthropathic psoriasis, unspecified: Secondary | ICD-10-CM | POA: Diagnosis not present

## 2019-09-20 DIAGNOSIS — M150 Primary generalized (osteo)arthritis: Secondary | ICD-10-CM | POA: Diagnosis not present

## 2019-09-21 ENCOUNTER — Telehealth: Payer: Self-pay | Admitting: Cardiology

## 2019-09-21 NOTE — Telephone Encounter (Signed)
° ° °  Pt c/o BP issue: STAT if pt c/o blurred vision, one-sided weakness or slurred speech  1. What are your last 5 BP readings? 127/70 HR 59, 112/68 HR  2. Are you having any other symptoms (ex. Dizziness, headache, blurred vision, passed out)? NO  3. What is your BP issue? PATIENT CALLING TO REPORT

## 2019-09-21 NOTE — Telephone Encounter (Signed)
Spoke with pt, just calling to report his bp was better. There has been confusion regarding the heart monitor. Will make dr hochrein aware.

## 2019-09-21 NOTE — Telephone Encounter (Signed)
Reviewed ZIO patch instructions with patient.

## 2019-09-21 NOTE — Telephone Encounter (Signed)
Returned call to pt he states his BP has been up 171/84 and pulse 54- today 150/78 and pulse rate 47. He states that he took another Losartan. Pt states that he takes losartan PRN >150 25mg  and >160 takes 50mg . He does not take BP after taking he will start taking BP after also. He states that he is taking all other medications as ordered. Denies any Chest pain, SOB, DOE, HA, dizziness, nausea, confusion or weakness. Has h/o AFIB. He states this only happened yesterday his BP has been gunning 10-12 149-sys 10-13 130 and 136 the 14th 671 these are systolic. He just wanted to let Dr [name] know just in case he wanted to adjust his medications.  Please advise  Pt is requesting pt assistance form for Eliquis. I will mail these to pt.

## 2019-09-21 NOTE — Telephone Encounter (Signed)
° ° °  Patient calling to request  clarify instructions

## 2019-09-21 NOTE — Telephone Encounter (Signed)
ollow Up:   Pt says his heart rate when down again.    STAT if HR is under 50 or over 120 (normal HR is 60-100 beats per minute)  What is your heart rate? 47, 54-  1) Do you have a log of your heart rate readings (document readings)? yes  2) Do you have any other symptoms?  Blood pressure when high last night- 171/84 and pulse 54- today 150/78 and pulse rate 47

## 2019-09-22 NOTE — Telephone Encounter (Signed)
I am not clear what the question about the heart monitor is.

## 2019-09-25 ENCOUNTER — Ambulatory Visit (INDEPENDENT_AMBULATORY_CARE_PROVIDER_SITE_OTHER): Payer: Medicare Other

## 2019-09-25 ENCOUNTER — Ambulatory Visit (HOSPITAL_COMMUNITY): Payer: Medicare Other | Attending: Cardiology

## 2019-09-25 ENCOUNTER — Other Ambulatory Visit: Payer: Self-pay

## 2019-09-25 DIAGNOSIS — I4891 Unspecified atrial fibrillation: Secondary | ICD-10-CM | POA: Insufficient documentation

## 2019-09-25 DIAGNOSIS — I10 Essential (primary) hypertension: Secondary | ICD-10-CM

## 2019-09-27 ENCOUNTER — Telehealth: Payer: Self-pay | Admitting: *Deleted

## 2019-09-27 NOTE — Telephone Encounter (Signed)
   Primary Cardiologist: Minus Breeding, MD  Chart reviewed as part of pre-operative protocol coverage. Given past medical history and time since last visit, based on ACC/AHA guidelines, Martin Rubio would be at acceptable risk for the planned procedure without further cardiovascular testing.   Martin Rubio is scheduled for a cardioversion 10/09/2019 for new onset symptomatic atrial fibrillation. Spoke with Dr. [name] and the patient regarding the situation. He is ok to proceed with an EGD without interruption in anticoagulation however if a colonoscopy is also planned, we will need to hold off until after the cardioversion. He is to be on uninterrupted anticoagulation for at least three weeks prior to a cardioversion. If interruption, will need to restart timeline which would obviously prolong his symptoms and need for restoration of NSR. Would also recommend obtaining a CBC prior to procedure.     [facility] Pre-Op team: -Could you please inform the requesting party of the plan and let us know from their standpoint going forward? -Please place note in pre-op team pool for review.   I will route this recommendation to the requesting party via Epic fax function as well as CV Pre-op callback team.   Please call with questions.  Kathyrn Drown, NP 09/27/2019, 11:43 AM

## 2019-09-27 NOTE — Telephone Encounter (Signed)
New Message     Patient calling in would like to speak to a nurse about the issue please call him back.

## 2019-09-27 NOTE — Telephone Encounter (Signed)
Dr. [name]  Can you please comment on this patients Eliquis? You saw the patient 09/22/2019 for dizziness and was found to be in new onset AF. He was started on Eliquis with the plan to proceed with DCCV after 3 weeks of AC. We have been contacted by GI as he is due for GD/colonoscopy which you have also mentioned in your note.   Would you want them to proceed with the procedure or wait until after DCCV and adequate AC? If the recommendation is to proceed, I have included pharmacy for holding recommendations or would appreciate yours as well.  Please send your response to the pre-op pool.   Thank you  Sharee Pimple

## 2019-09-27 NOTE — Telephone Encounter (Signed)
Routed to pre-op

## 2019-09-27 NOTE — Telephone Encounter (Signed)
   [facility] Pre-operative Risk Assessment    Request for surgical clearance:  1. What type of surgery is being performed? EGD/colonoscopy  2. When is this surgery scheduled? TBD   3. What type of clearance is required (medical clearance vs. Pharmacy clearance to hold med vs. Both)? both  4. Are there any medications that need to be held prior to surgery and how long?eliquis-need direction   5. Practice name and name of physician performing surgery? [facility]   6. What is your office phone number [phone]   7.   What is your office fax number [phone]  8.   Anesthesia type (None, local, MAC, general) ? Not listed   Martin Rubio 09/27/2019, 7:19 AM  _________________________________________________________________   (provider comments below)

## 2019-09-27 NOTE — Telephone Encounter (Signed)
Plan as we discussed.  Please document your phone conversations and instructions to GI.

## 2019-09-27 NOTE — Telephone Encounter (Signed)
Faxed to [place]. I will remove from the pre op call back pool.

## 2019-09-27 NOTE — Telephone Encounter (Signed)
Patient with diagnosis of atrial fibrillation on Eliquis for anticoagulation.    Procedure: colonoscopy/EGD Date of procedure: TBD  CHADS2-VASc score of  3 (HTN, AGE, CAD)  CrCl 52.5 Platelet count 227  Per office protocol, patient can hold Eliquis for 1 day prior to procedure.   Patient will not need bridging with Lovenox (enoxaparin) around procedure.  Patient should restart Eliquis on the evening of procedure or day after, at discretion of procedure MD  Please note that patient is scheduled for cardioversion on Nov 2.  Eliquis needs to be taken, uninterrupted, until this time.  Please schedule GI procedures for after that date.

## 2019-09-28 ENCOUNTER — Telehealth: Payer: Self-pay | Admitting: Cardiology

## 2019-09-28 NOTE — Telephone Encounter (Signed)
New Message   Patient is calling because he is needing a letter advising as to why he needs a cane to give the DME ([place]). It can be faxed to [phone].

## 2019-09-28 NOTE — Telephone Encounter (Signed)
Please send a letter.  He needs a cain because of decreased balance

## 2019-09-28 NOTE — Telephone Encounter (Signed)
Please advise of letter

## 2019-09-29 NOTE — Telephone Encounter (Signed)
Wrote letter per Dr Hochrein's request. Faxed to [place] and sent pt a copy in mail and [place]. Pt aware.

## 2019-10-03 ENCOUNTER — Other Ambulatory Visit: Payer: Self-pay

## 2019-10-03 ENCOUNTER — Other Ambulatory Visit (HOSPITAL_COMMUNITY): Payer: Self-pay | Admitting: Cardiology

## 2019-10-03 ENCOUNTER — Ambulatory Visit (HOSPITAL_COMMUNITY)
Admission: RE | Admit: 2019-10-03 | Discharge: 2019-10-03 | Disposition: A | Discharge status: Home / Self Care | Payer: Medicare Other | Source: Ambulatory Visit | Attending: Cardiovascular Disease | Admitting: Cardiovascular Disease

## 2019-10-03 DIAGNOSIS — I723 Aneurysm of iliac artery: Secondary | ICD-10-CM | POA: Diagnosis not present

## 2019-10-05 ENCOUNTER — Other Ambulatory Visit (HOSPITAL_COMMUNITY)
Admission: RE | Admit: 2019-10-05 | Discharge: 2019-10-05 | Disposition: A | Discharge status: Home / Self Care | Payer: Medicare Other | Source: Ambulatory Visit | Attending: Internal Medicine | Admitting: Internal Medicine

## 2019-10-05 ENCOUNTER — Other Ambulatory Visit: Payer: Self-pay

## 2019-10-05 DIAGNOSIS — I10 Essential (primary) hypertension: Secondary | ICD-10-CM | POA: Diagnosis not present

## 2019-10-05 DIAGNOSIS — Z20828 Contact with and (suspected) exposure to other viral communicable diseases: Secondary | ICD-10-CM | POA: Diagnosis not present

## 2019-10-05 DIAGNOSIS — Z01812 Encounter for preprocedural laboratory examination: Secondary | ICD-10-CM | POA: Diagnosis not present

## 2019-10-05 DIAGNOSIS — I480 Paroxysmal atrial fibrillation: Secondary | ICD-10-CM | POA: Diagnosis not present

## 2019-10-05 LAB — CBC
Hematocrit: 41.3 % (ref 37.5–51.0)
Hemoglobin: 13 g/dL (ref 13.0–17.7)
MCH: 23.4 pg — ABNORMAL LOW (ref 26.6–33.0)
MCHC: 31.5 g/dL (ref 31.5–35.7)
MCV: 74 fL — ABNORMAL LOW (ref 79–97)
Platelets: 203 10*3/uL (ref 150–450)
RBC: 5.56 x10E6/uL (ref 4.14–5.80)
RDW: 16.7 % — ABNORMAL HIGH (ref 11.6–15.4)
WBC: 6.8 10*3/uL (ref 3.4–10.8)

## 2019-10-05 LAB — BASIC METABOLIC PANEL
BUN/Creatinine Ratio: 14 (ref 10–24)
BUN: 22 mg/dL (ref 8–27)
CO2: 21 mmol/L (ref 20–29)
Calcium: 10 mg/dL (ref 8.6–10.2)
Chloride: 103 mmol/L (ref 96–106)
Creatinine, Ser: 1.53 mg/dL — ABNORMAL HIGH (ref 0.76–1.27)
GFR calc Af Amer: 51 mL/min/{1.73_m2} — ABNORMAL LOW (ref >59)
GFR calc non Af Amer: 44 mL/min/{1.73_m2} — ABNORMAL LOW (ref >59)
Glucose: 84 mg/dL (ref 65–99)
Potassium: 4.7 mmol/L (ref 3.5–5.2)
Sodium: 141 mmol/L (ref 134–144)

## 2019-10-05 LAB — SARS CORONAVIRUS 2 (TAT 6-24 HRS): SARS Coronavirus 2: NEGATIVE

## 2019-10-05 NOTE — Progress Notes (Signed)
Left message to reschedule covid test

## 2019-10-07 DIAGNOSIS — I4891 Unspecified atrial fibrillation: Secondary | ICD-10-CM | POA: Diagnosis not present

## 2019-10-09 ENCOUNTER — Encounter (HOSPITAL_COMMUNITY)
Admission: RE | Disposition: A | Discharge status: Home / Self Care | Payer: Self-pay | Source: Home / Self Care | Attending: Internal Medicine

## 2019-10-09 ENCOUNTER — Ambulatory Visit (HOSPITAL_COMMUNITY): Payer: Medicare Other | Admitting: Certified Registered Nurse Anesthetist

## 2019-10-09 ENCOUNTER — Ambulatory Visit: Payer: Medicare Other | Admitting: Podiatry

## 2019-10-09 ENCOUNTER — Other Ambulatory Visit: Payer: Self-pay

## 2019-10-09 ENCOUNTER — Ambulatory Visit (HOSPITAL_COMMUNITY)
Admission: RE | Admit: 2019-10-09 | Discharge: 2019-10-09 | Disposition: A | Discharge status: Home / Self Care | Payer: Medicare Other | Attending: Internal Medicine | Admitting: Internal Medicine

## 2019-10-09 ENCOUNTER — Encounter (HOSPITAL_COMMUNITY): Payer: Self-pay | Admitting: Certified Registered Nurse Anesthetist

## 2019-10-09 ENCOUNTER — Telehealth: Payer: Self-pay

## 2019-10-09 DIAGNOSIS — Z538 Procedure and treatment not carried out for other reasons: Secondary | ICD-10-CM | POA: Diagnosis not present

## 2019-10-09 DIAGNOSIS — I4891 Unspecified atrial fibrillation: Secondary | ICD-10-CM | POA: Insufficient documentation

## 2019-10-09 DIAGNOSIS — E785 Hyperlipidemia, unspecified: Secondary | ICD-10-CM

## 2019-10-09 SURGERY — CANCELLED PROCEDURE

## 2019-10-09 MED ORDER — SODIUM CHLORIDE 0.9 % IV SOLN: INTRAVENOUS | Status: DC

## 2019-10-09 NOTE — Telephone Encounter (Signed)
Called pt to let him know Dr [name] wants a f/u appt in 3 weeks and to stay on anticoagulant. Verbalized understanding. Scheduled appt for 11/20. Stated he is having a endoscopy in on 10/18/19 and needs to know if he can be off eliquis for 3 days. Advised pt to contact GI to send cardiac clearance over to the office. Verbalized understanding. Will route to Dr [name] for review.

## 2019-10-09 NOTE — CV Procedure (Signed)
Cardioversion  Pt presented for cardioversion today    Telemetry showed SR. EKG done    Procedure cancelled.  Dorris Carnes MD

## 2019-10-09 NOTE — Anesthesia Preprocedure Evaluation (Addendum)
Anesthesia Evaluation    Reviewed: Allergy & Precautions, Patient's Chart, lab work & pertinent test results  Airway        Dental   Pulmonary sleep apnea and Continuous Positive Airway Pressure Ventilation , former smoker,           Cardiovascular hypertension, Pt. on medications      Neuro/Psych negative neurological ROS     GI/Hepatic Neg liver ROS, GERD  ,  Endo/Other  Hypothyroidism   Renal/GU negative Renal ROS     Musculoskeletal negative musculoskeletal ROS (+)   Abdominal   Peds  Hematology negative hematology ROS (+)   Anesthesia Other Findings Day of surgery medications reviewed with the patient.  Reproductive/Obstetrics                             Anesthesia Physical Anesthesia Plan  ASA: III  Anesthesia Plan: General   Post-op Pain Management:    Induction: Intravenous  PONV Risk Score and Plan: Treatment may vary due to age or medical condition  Airway Management Planned: Mask  Additional Equipment:   Intra-op Plan:   Post-operative Plan:   Informed Consent:   Plan Discussed with: Anesthesiologist  Anesthesia Plan Comments: (PT in SR procedure cancelled)       Anesthesia Quick Evaluation

## 2019-10-09 NOTE — Progress Notes (Signed)
Patient appeared to be in NSR on admission to endo.  EKG performed. Dr Harrington Challenger confirmed, spoke with patient, order placed for discharge.

## 2019-10-10 ENCOUNTER — Telehealth: Payer: Self-pay | Admitting: *Deleted

## 2019-10-10 DIAGNOSIS — Z79899 Other long term (current) drug therapy: Secondary | ICD-10-CM | POA: Diagnosis not present

## 2019-10-10 DIAGNOSIS — L4050 Arthropathic psoriasis, unspecified: Secondary | ICD-10-CM | POA: Diagnosis not present

## 2019-10-10 NOTE — Telephone Encounter (Signed)
Dr. [name]  Pt presented for DCCV 11/2 and was found ot be in NSR confirmed by EKG. Procedure cancelled. What are your recommendations to proceed with EGD/colonoscopy?   Please send your recommendation to the pre-op pool   Thank you  Sharee Pimple

## 2019-10-10 NOTE — Telephone Encounter (Signed)
Patient with diagnosis of afib on Eliquis for anticoagulation.    Procedure: egd/colonoscopy Date of procedure: TBD  CHADS2-VASc score of  3 (CHF, HTN, AGE)  CrCl 56 ml/min  Per office protocol, patient can hold Eliquis for 1-2 days prior to procedure.

## 2019-10-10 NOTE — Telephone Encounter (Signed)
PRIMARY CARDIOLOGIST - [facility] Pre-operative Risk Assessment    Request for surgical clearance:  1. What type of surgery is being performed? egd/colonoscopy  2. When is this surgery scheduled? tbd  3. What type of clearance is required (medical clearance vs. Pharmacy clearance to hold med vs. Both)? BOTH  4. Are there any medications that need to be held prior to surgery and how long?ELIQUIS  5. Practice name and name of physician performing surgery? [facility]; UNKNOWN  6. What is your office phone number [phone]    7.   What is your office fax number [phone]   8.   Anesthesia type (None, local, MAC, general) ? UNKNOWN   Raiford Simmonds 10/10/2019, 1:28 PM  _________________________________________________________________   (provider comments below)

## 2019-10-10 NOTE — Telephone Encounter (Signed)
I would prefer he not stop the anticoagulation.  However, agree that he needs to check with GI.

## 2019-10-11 ENCOUNTER — Ambulatory Visit (INDEPENDENT_AMBULATORY_CARE_PROVIDER_SITE_OTHER): Payer: Medicare Other

## 2019-10-11 ENCOUNTER — Other Ambulatory Visit: Payer: Self-pay

## 2019-10-11 ENCOUNTER — Ambulatory Visit (INDEPENDENT_AMBULATORY_CARE_PROVIDER_SITE_OTHER): Payer: Medicare Other | Admitting: Podiatry

## 2019-10-11 ENCOUNTER — Telehealth: Payer: Self-pay | Admitting: Cardiology

## 2019-10-11 DIAGNOSIS — M898X7 Other specified disorders of bone, ankle and foot: Secondary | ICD-10-CM

## 2019-10-11 DIAGNOSIS — L603 Nail dystrophy: Secondary | ICD-10-CM | POA: Diagnosis not present

## 2019-10-11 DIAGNOSIS — I4891 Unspecified atrial fibrillation: Secondary | ICD-10-CM

## 2019-10-11 DIAGNOSIS — M79672 Pain in left foot: Secondary | ICD-10-CM

## 2019-10-11 NOTE — Telephone Encounter (Signed)
Patient calling the office for samples of medication:   1.  What medication and dosage are you requesting samples for? apixaban (ELIQUIS) 5 MG TABS tablet  2.  Are you currently out of this medication? no   Patient is applying for assistance with [employer] to get help covering his Eliquis. There are some forms that Dr. [name] will need to fill out for the application, and the patient wanted to know if he could drop the paperwork off tomorrow to Dr. [name].   If we have any samples of Eliquis, he would like to pick them up tomorrow, or whenever he comes to drop off the paperwork. Please let the patient know when is best for him to come by the office. He would like to have the application done before his appointment 11/20

## 2019-10-11 NOTE — Telephone Encounter (Signed)
I did review his monitor that he wore and he has PAF.  He will likely need to be on long term anticoagulation and needs to be cleared by GI for this.  He needs the GI procedure and it is OK to be off of anticoagulation of needed for this.  No other work up or change in therapy is needed prior to this

## 2019-10-11 NOTE — Telephone Encounter (Signed)
   Primary Cardiologist: Minus Breeding, MD  Chart reviewed as part of pre-operative protocol coverage. Given past medical history and time since last visit, based on ACC/AHA guidelines, ZEVEN KOCAK would be at acceptable risk for the planned procedure without further cardiovascular testing.   Patient with diagnosis of afib on Eliquis for anticoagulation.    Procedure: egd/colonoscopy Date of procedure: TBD  CHADS2-VASc score of  3 (CHF, HTN, AGE)  CrCl 56 ml/min  Per office protocol, patient can hold Eliquis for 1-2 days prior to procedure.    I will route this recommendation to the requesting party via Epic fax function and remove from pre-op pool.  Please call with questions.  Kathyrn Drown, NP 10/11/2019, 1:29 PM

## 2019-10-11 NOTE — Telephone Encounter (Signed)
The patient stated that he will come by tomorrow to drop off the assistance forms to have Dr. [name] sign.  He has been made aware that we currently do not have any Eliquis 5 mg samples available.

## 2019-10-11 NOTE — Patient Instructions (Signed)
Pre-Operative Instructions  Congratulations, you have decided to take an important step towards improving your quality of life.  You can be assured that the doctors and staff at [facility] will be with you every step of the way.  Here are some important things you should know:  1. Plan to be at the surgery center/hospital at least 1 (one) hour prior to your scheduled time, unless otherwise directed by the surgical center/hospital staff.  You must have a responsible adult accompany you, remain during the surgery and drive you home.  Make sure you have directions to the surgical center/hospital to ensure you arrive on time. 2. If you are having surgery at [facility] or [facility] hospitals, you will need a copy of your medical history and physical form from your family physician within one month prior to the date of surgery. We will give you a form for your primary physician to complete.  3. We make every effort to accommodate the date you request for surgery.  However, there are times where surgery dates or times have to be moved.  We will contact you as soon as possible if a change in schedule is required.   4. No aspirin/ibuprofen for one week before surgery.  If you are on aspirin, any non-steroidal anti-inflammatory medications (Mobic, Aleve, Ibuprofen) should not be taken seven (7) days prior to your surgery.  You make take Tylenol for pain prior to surgery.  5. Medications - If you are taking daily heart and blood pressure medications, seizure, reflux, allergy, asthma, anxiety, pain or diabetes medications, make sure you notify the surgery center/hospital before the day of surgery so they can tell you which medications you should take or avoid the day of surgery. 6. No food or drink after midnight the night before surgery unless directed otherwise by surgical center/hospital staff. 7. No alcoholic beverages 24-hours prior to surgery.  No smoking 24-hours prior or 24-hours after  surgery. 8. Wear loose pants or shorts. They should be loose enough to fit over bandages, boots, and casts. 9. Don't wear slip-on shoes. Sneakers are preferred. 10. Bring your boot with you to the surgery center/hospital.  Also bring crutches or a walker if your physician has prescribed it for you.  If you do not have this equipment, it will be provided for you after surgery. 11. If you have not been contacted by the surgery center/hospital by the day before your surgery, call to confirm the date and time of your surgery. 12. Leave-time from work may vary depending on the type of surgery you have.  Appropriate arrangements should be made prior to surgery with your employer. 13. Prescriptions will be provided immediately following surgery by your doctor.  Fill these as soon as possible after surgery and take the medication as directed. Pain medications will not be refilled on weekends and must be approved by the doctor. 14. Remove nail polish on the operative foot and avoid getting pedicures prior to surgery. 15. Wash the night before surgery.  The night before surgery wash the foot and leg well with water and the antibacterial soap provided. Be sure to pay special attention to beneath the toenails and in between the toes.  Wash for at least three (3) minutes. Rinse thoroughly with water and dry well with a towel.  Perform this wash unless told not to do so by your physician.  Enclosed: 1 Ice pack (please put in freezer the night before surgery)   1 Hibiclens skin cleaner     Pre-op instructions  If you have any questions regarding the instructions, please do not hesitate to call our office.  [place]: [address], [place], [place] [address] -- [phone]  [place]: [address] [place], [place] [address] -- [phone]  [place]: [address]  [place], [place] [address] -- [phone]   Website: [email or web address] 

## 2019-10-12 MED ORDER — APIXABAN 5 MG PO TABS: 5.0000 mg | ORAL_TABLET | Freq: Two times a day (BID) | ORAL | 3 refills | Status: DC

## 2019-10-12 NOTE — Telephone Encounter (Signed)
Patient brought in Patient Assistance forms He needs to get his proof of income, will bring by the office.  Have physicians portion for Dr [name] to sign

## 2019-10-13 ENCOUNTER — Other Ambulatory Visit: Payer: Self-pay | Admitting: Podiatry

## 2019-10-13 DIAGNOSIS — M898X7 Other specified disorders of bone, ankle and foot: Secondary | ICD-10-CM

## 2019-10-16 ENCOUNTER — Telehealth: Payer: Self-pay

## 2019-10-16 ENCOUNTER — Telehealth: Payer: Self-pay | Admitting: *Deleted

## 2019-10-16 NOTE — Progress Notes (Signed)
   HPI: 73 y.o. male presenting today as a new patient, referred by Dr. Gershon Mussel, with a chief complaint of a painful nodule to the dorsal left foot that has been present for multiple years. He states he thinks the nodule has been there his whole life. He also reports pain to the medial foot that extends to the medial ankle. He reports associated swelling. He has been taking pain medication for treatment. Walking increases the pain. Patient is here for further evaluation and treatment.   Past Medical History:  Diagnosis Date  . Bronchitis   . Diverticulosis   . FH: BPH (benign prostatic hypertrophy)   . History of Graves' disease 01/19/2018   S/p ablation  . HTN (hypertension)   . Hyperlipidemia   . Obesity   . Psoriatic arthritis ([place])   . Rheumatoid arthritis ([place]) 08/07/2014  . Sleep apnea    CPAP  . Thyroid disease      Physical Exam: General: The patient is alert and oriented x3 in no acute distress.  Dermatology: Hyperkeratotic, discolored, thickened, onychodystrophy of the left great toenail. Skin is warm, dry and supple bilateral lower extremities. Negative for open lesions or macerations.  Vascular: Palpable pedal pulses bilaterally. No edema or erythema noted. Capillary refill within normal limits.  Neurological: Epicritic and protective threshold grossly intact bilaterally.   Musculoskeletal Exam: Exostosis of the 1st met-cuneiform noted of the left foot. Range of motion within normal limits to all pedal and ankle joints bilateral. Muscle strength 5/5 in all groups bilateral.   Radiographic Exam:  Exostosis noted to the 1st met-cuneiform. Joint spaces preserved. No fracture/dislocation/boney destruction.    Assessment: 1. 1st met-cuneiform exostosis left 2. Dystrophic nail left great toe   Plan of Care:  1. Patient evaluated. X-Rays reviewed.  2. Today we discussed the conservative versus surgical management of the presenting pathology. The patient opts for surgical  management. All possible complications and details of the procedure were explained. All patient questions were answered. No guarantees were expressed or implied. 3. Authorization for surgery was initiated today. Surgery will consist of 1st met-cuneiform exostectomy left.  4. Mechanical debridement of the left great toenail performed using a nail nipper. Filed with dremel without incident.  5. Return to clinic one week post op.       Edrick Kins, DPM [facility]  Dr. Edrick Kins, DPM    [address],  [address]                Office [phone]  Fax [phone]

## 2019-10-16 NOTE — Telephone Encounter (Signed)
   [facility] Pre-operative Risk Assessment    Request for surgical clearance:  1. What type of surgery is being performed? EGD   2. When is this surgery scheduled? TBD  3. What type of clearance is required (medical clearance vs. Pharmacy clearance to hold med vs. Both)?   BOTH  4. Are there any medications that need to be held prior to surgery and how long? ELIQUIS  5. Practice name and name of physician performing surgery? [facility]  UNKNOWN  6. What is your office phone number [phone]    7.   What is your office fax number [phone]  8.   Anesthesia type (None, local, MAC, general) ?  MAC   Devra Dopp 10/16/2019, 2:11 PM  _________________________________________________________________   (provider comments below)

## 2019-10-16 NOTE — Telephone Encounter (Signed)
Called and spoke w/pt regarding missing information on the pt assistance forms. Pt provided information and voiced understanding.

## 2019-10-17 NOTE — Telephone Encounter (Signed)
Patient with diagnosis of afib on Eliquis for anticoagulation.    Procedure: EGD  Date of procedure: TBD  CHADS2-VASc score of  3 (HTN, AGE, CAD)  CrCl 13ml/min  Per office protocol, patient can hold Eliquis for 1-2 days prior to procedure.

## 2019-10-17 NOTE — Telephone Encounter (Signed)
Spoke with [employer]. They did receive both faxes, one on the 9th and one on the 10th.  Nothing further is needed.

## 2019-10-17 NOTE — Telephone Encounter (Signed)
I will re-fax Martin Rubio's note dated 10/11/19.  Callback - please contact the requesting office to make sure they received our letter. Please make sure they don't need anything further from [place].

## 2019-10-18 DIAGNOSIS — K259 Gastric ulcer, unspecified as acute or chronic, without hemorrhage or perforation: Secondary | ICD-10-CM | POA: Diagnosis not present

## 2019-10-18 DIAGNOSIS — Z01818 Encounter for other preprocedural examination: Secondary | ICD-10-CM | POA: Diagnosis not present

## 2019-10-20 DIAGNOSIS — E785 Hyperlipidemia, unspecified: Secondary | ICD-10-CM | POA: Diagnosis not present

## 2019-10-20 LAB — LIPID PANEL
Chol/HDL Ratio: 3 ratio (ref 0.0–5.0)
Cholesterol, Total: 152 mg/dL (ref 100–199)
HDL: 51 mg/dL (ref >39)
LDL Chol Calc (NIH): 80 mg/dL (ref 0–99)
Triglycerides: 116 mg/dL (ref 0–149)
VLDL Cholesterol Cal: 21 mg/dL (ref 5–40)

## 2019-10-25 DIAGNOSIS — K2270 Barrett's esophagus without dysplasia: Secondary | ICD-10-CM | POA: Diagnosis not present

## 2019-10-26 DIAGNOSIS — I480 Paroxysmal atrial fibrillation: Secondary | ICD-10-CM | POA: Insufficient documentation

## 2019-10-26 NOTE — Progress Notes (Signed)
HPI The patient returns for follow up of HTN.  His last echo in Nov of 2016 demonstrated no septal hypertrophy.  He has had problems with BP and with self medication secondary to medication tolerance issues.  He has been dizzy and was in atrial fib with slow rate when I last saw him.  I set him up for DCCV but when he presented he was in NSR.  He wore a Holter and and he had PAF with slow rate.  He had flutter, Mobitz 1, first degree AV block.   Since I last saw him he has had increasing episodes of dizziness.  States he correlates this on his Alive cor with his fibrillation.  He is tolerating anticoagulation.  He completed a GI work-up and has no active bleeding.  He says sometimes his heart rate gets up into the 90s and he wonders if this could be associated with certain foods.  He has not had any frank syncope.  He get dizzy sometimes when he does not take his antihypertensive if he is dizzy and he notices his blood pressure is low.  He has not had any new chest pressure, neck or arm discomfort.  He describes some right arm discomfort occasionally.  Allergies  Allergen Reactions  . Codeine     Tension, nasty feeling   . Nsaids Other (See Comments)    Renal insufficiency  . Prednisone Other (See Comments)    Unknown/ some times takes  . Statins Other (See Comments)    Joint pain Joint pain Joint pain Joint pain Joint pain   . Sulfa Antibiotics Other (See Comments)    Joint pain    Current Outpatient Medications  Medication Sig Dispense Refill  . alendronate (FOSAMAX) 70 MG tablet Take 1 tablet (70 mg total) by mouth once a week. Take with a full glass of water on an empty stomach. (Patient taking differently: Take 70 mg by mouth once a week. Take with a full glass of water on an empty stomach.  Wednesday) 26 tablet 1  . Alirocumab (PRALUENT) 150 MG/ML SOAJ Inject 150 mg into the skin every 14 (fourteen) days. 6 pen 3  . apixaban (ELIQUIS) 5 MG TABS tablet Take 1 tablet (5 mg  total) by mouth 2 (two) times daily. 180 tablet 3  . golimumab 2 mg/kg in sodium chloride 0.9 % Inject 2 mg/kg into the vein every 8 (eight) weeks.    Marland Kitchen levothyroxine (SYNTHROID) 125 MCG tablet TAKE 1 TABLET (125 MCG TOTAL) BY MOUTH DAILY ON AN EMPTY STOMACH (Patient taking differently: Take 125 mcg by mouth daily before breakfast. ) 90 tablet 1  . losartan (COZAAR) 50 MG tablet Take 1 tablet (50 mg total) by mouth 2 (two) times daily. (Patient taking differently: Take 50 mg by mouth every other day. ) 180 tablet 1  . pantoprazole (PROTONIX) 40 MG tablet Take 40 mg by mouth daily.    . sucralfate (CARAFATE) 1 g tablet Take 1 g by mouth 3 (three) times daily.    . tamsulosin (FLOMAX) 0.4 MG CAPS capsule Take 0.4 mg by mouth at bedtime.     . triamcinolone cream (KENALOG) 0.1 % Apply 1 application topically as needed for itching.    . fluticasone (FLONASE) 50 MCG/ACT nasal spray Place 1 spray into both nostrils as needed for allergies.      No current facility-administered medications for this visit.     Past Medical History:  Diagnosis Date  . Bronchitis   .  Diverticulosis   . FH: BPH (benign prostatic hypertrophy)   . History of Graves' disease 01/19/2018   S/p ablation  . HTN (hypertension)   . Hyperlipidemia   . Obesity   . Psoriatic arthritis ([place])   . Rheumatoid arthritis ([place]) 08/07/2014  . Sleep apnea    CPAP  . Thyroid disease     Past Surgical History:  Procedure Laterality Date  . INGUINAL HERNIA REPAIR     bilateral  . KNEE SURGERY     bilateral arthroscopic  . TRANSURETHRAL RESECTION OF PROSTATE    . UMBILICAL HERNIA REPAIR      ROS:   As stated in the HPI and negative for all other systems.   PHYSICAL EXAM BP (!) 151/81   Pulse 64   Temp (!) 97.3 F (36.3 C)   Ht 5' 10"  (1.778 m)   Wt 218 lb 6.4 oz (99.1 kg)   SpO2 98%   BMI 31.34 kg/m   GENERAL:  Well appearing NECK:  No jugular venous distention, waveform within normal limits, carotid upstroke brisk  and symmetric, no bruits, no thyromegaly LUNGS:  Clear to auscultation bilaterally CHEST:  Unremarkable HEART:  PMI not displaced or sustained,S1 and S2 within normal limits, no S3, no S4, no clicks, no rubs, no murmurs ABD:  Flat, positive bowel sounds normal in frequency in pitch, no bruits, no rebound, no guarding, no midline pulsatile mass, no hepatomegaly, no splenomegaly EXT:  2 plus pulses throughout, trace edema, no cyanosis no clubbing   Lab Results  Component Value Date   CHOL 152 10/20/2019   TRIG 116 10/20/2019   HDL 51 10/20/2019   LDLCALC 80 10/20/2019      ASSESSMENT AND PLAN   Atrial fib He has PAF.  I think he is symptomatic with this.  He has significant bradycardia arrhythmias as well.  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  Am going to set him up to see Dr. [name] to discuss the possibility of a pacemaker given his complex arrhythmias.  For now he will continue on anticoagulation.   [name] has a CHA2DS2 - VASc score of 3.      Left common iliac anuerysm He had stable aneurysms in October last year.   He will have follow-up next year.  HTN The blood pressure is slightly elevated today but it has been labile.  No changes in therapy.   Septal hypertrophy There was no significant hypertrophy on the recent echo.  I reviewed this result for this appointment.   Dyslipidemia He is being managed in the lipid clinic with PCSK9 inhibitor.  Met with the pharmacist today during this appointment to discuss the PCSK9.

## 2019-10-27 ENCOUNTER — Encounter: Payer: Self-pay | Admitting: Cardiology

## 2019-10-27 ENCOUNTER — Other Ambulatory Visit: Payer: Self-pay

## 2019-10-27 ENCOUNTER — Ambulatory Visit (INDEPENDENT_AMBULATORY_CARE_PROVIDER_SITE_OTHER): Payer: Medicare Other | Admitting: Cardiology

## 2019-10-27 VITALS — BP 151/81 | HR 64 | Temp 97.3°F | Ht 70.0 in | Wt 218.4 lb

## 2019-10-27 DIAGNOSIS — I723 Aneurysm of iliac artery: Secondary | ICD-10-CM

## 2019-10-27 DIAGNOSIS — I10 Essential (primary) hypertension: Secondary | ICD-10-CM | POA: Diagnosis not present

## 2019-10-27 DIAGNOSIS — E785 Hyperlipidemia, unspecified: Secondary | ICD-10-CM

## 2019-10-27 DIAGNOSIS — I480 Paroxysmal atrial fibrillation: Secondary | ICD-10-CM

## 2019-10-27 NOTE — Patient Instructions (Signed)
Medication Instructions:  Your physician recommends that you continue on your current medications as directed. Please refer to the Current Medication list given to you today.  If you need a refill on your cardiac medications before your next appointment, please call your pharmacy.   Lab work: NONE  Testing/Procedures: NONE  Follow-Up: At [facility], you and your health needs are our priority.  As part of our continuing mission to provide you with exceptional heart care, we have created designated Provider Care Teams.  These Care Teams include your primary Cardiologist (physician) and Advanced Practice Providers (APPs -  Physician Assistants and Nurse Practitioners) who all work together to provide you with the care you need, when you need it. You may see Minus Breeding, MD or one of the following Advanced Practice Providers on your designated Care Team:    Rosaria Ferries, PA-C  Jory Sims, DNP, ANP  Cadence Kathlen Mody, NP   Your physician wants you to follow-up in: 3 months.   Any Other Special Instructions Will Be Listed Below (If Applicable). Make an appointment with Cristopher Peru, MD

## 2019-10-30 ENCOUNTER — Telehealth: Payer: Self-pay | Admitting: Pharmacist

## 2019-10-30 DIAGNOSIS — I4891 Unspecified atrial fibrillation: Secondary | ICD-10-CM

## 2019-10-30 MED ORDER — APIXABAN 5 MG PO TABS: 5.0000 mg | ORAL_TABLET | Freq: Two times a day (BID) | ORAL | 3 refills | Status: DC

## 2019-10-30 NOTE — Telephone Encounter (Signed)
Patient admitted skipping 1 or 2 doses.  Has medication free of charge until January, then [employer] from January/2021 to Dec/2021.   Will repeat fasting Lipid panel in 3 months

## 2019-10-31 NOTE — Telephone Encounter (Signed)
Received notification from Patient Assistance household size missing refaxed forms with information requested, patient aware

## 2019-11-07 ENCOUNTER — Telehealth: Payer: Self-pay

## 2019-11-07 NOTE — Telephone Encounter (Signed)
INCOMING CALL FROM SCHEDULING. [facility] CONTACTING OFFICE. PT OF DR. HOCHREIN HAS LAB ORDER WITH INCOMPLETE CODE PROVIDED. CALL TRANSFERRED. SPOKE WITH [facility] REP WHO STATED SOME OF CODE MISSING FOR PT LABS ORDERED 11/29. CURRENT CODE IS I48.9; REQUESTS MORE CODING INFORMATION? REQUESTED TO PUT [facility] REP ON HOLD. HE AGREED. WHILE ON HOLD, NURSE NOTICED THAT LINE DISCONNECTED. ATTEMPTED CALL BACK TO [phone]. NO ANSWER

## 2019-11-08 DIAGNOSIS — Z23 Encounter for immunization: Secondary | ICD-10-CM | POA: Diagnosis not present

## 2019-11-14 NOTE — Telephone Encounter (Signed)
Follow up   [facility] Ref # [id number] Phone [phone]  Rep Malachy Mood asking for additional codes for lab draw done on 10-27-2023

## 2019-11-14 NOTE — Telephone Encounter (Signed)
Spoke with [facility], additional diagnosis code given

## 2019-11-22 ENCOUNTER — Ambulatory Visit (INDEPENDENT_AMBULATORY_CARE_PROVIDER_SITE_OTHER): Payer: Medicare Other | Admitting: Internal Medicine

## 2019-11-22 ENCOUNTER — Encounter: Payer: Self-pay | Admitting: Internal Medicine

## 2019-11-22 ENCOUNTER — Other Ambulatory Visit: Payer: Self-pay

## 2019-11-22 ENCOUNTER — Telehealth: Payer: Self-pay

## 2019-11-22 ENCOUNTER — Telehealth: Payer: Self-pay | Admitting: *Deleted

## 2019-11-22 VITALS — BP 180/90 | HR 64 | Ht 70.0 in | Wt 220.0 lb

## 2019-11-22 DIAGNOSIS — I480 Paroxysmal atrial fibrillation: Secondary | ICD-10-CM

## 2019-11-22 MED ORDER — PRALUENT 150 MG/ML ~~LOC~~ SOAJ: 150.0000 mg | SUBCUTANEOUS | 3 refills | Status: DC

## 2019-11-22 NOTE — Telephone Encounter (Signed)
I attempted to call the patient.  I left him a message that Dr. Amalia Hailey wants to get cardiac medical clearance.  I told him I was going to send a letter to Dr. [name].

## 2019-11-22 NOTE — Patient Instructions (Addendum)
Medication Instructions:  Your physician recommends that you continue on your current medications as directed. Please refer to the Current Medication list given to you today.  *If you need a refill on your cardiac medications before your next appointment, please call your pharmacy*  Lab Work: None ordered.   If you have labs (blood work) drawn today and your tests are completely normal, you will receive your results only by: Marland Kitchen MyChart Message (if you have MyChart) OR . A paper copy in the mail If you have any lab test that is abnormal or we need to change your treatment, we will call you to review the results.  Testing/Procedures: Your physician has recommended that you have a pacemaker inserted. A pacemaker is a small device that is placed under the skin of your chest or abdomen to help control abnormal heart rhythms. This device uses electrical pulses to prompt the heart to beat at a normal rate. Pacemakers are used to treat heart rhythms that are too slow. Wire (leads) are attached to the pacemaker that goes into the chambers of you heart. This is done in the hospital and usually requires and overnight stay. Please see the instruction sheet given to you today for more information.    Follow-Up: At [facility], you and your health needs are our priority.  As part of our continuing mission to provide you with exceptional heart care, we have created designated Provider Care Teams.  These Care Teams include your primary Cardiologist (physician) and Advanced Practice Providers (APPs -  Physician Assistants and Nurse Practitioners) who all work together to provide you with the care you need, when you need it.  You will call me after your foot surgery to schedule pacemaker    Pacemaker Implantation, Adult Pacemaker implantation is a procedure to place a pacemaker inside your chest. A pacemaker is a small computer that sends electrical signals to the heart and helps your heart beat normally.  A pacemaker also stores information about your heart rhythms. You may need pacemaker implantation if you:  Have a slow heartbeat (bradycardia).  Faint (syncope).  Have shortness of breath (dyspnea) due to heart problems. The pacemaker attaches to your heart through a wire, called a lead. Sometimes just one lead is needed. Other times, there will be two leads. There are two types of pacemakers:  Transvenous pacemaker. This type is placed under the skin or muscle of your chest. The lead goes through a vein in the chest area to reach the inside of the heart.  Epicardial pacemaker. This type is placed under the skin or muscle of your chest or belly. The lead goes through your chest to the outside of the heart. Tell a health care provider about:  Any allergies you have.  All medicines you are taking, including vitamins, herbs, eye drops, creams, and over-the-counter medicines.  Any problems you or family members have had with anesthetic medicines.  Any blood or bone disorders you have.  Any surgeries you have had.  Any medical conditions you have.  Whether you are pregnant or may be pregnant. What are the risks? Generally, this is a safe procedure. However, problems may occur, including:  Infection.  Bleeding.  Failure of the pacemaker or the lead.  Collapse of a lung or bleeding into a lung.  Blood clot inside a blood vessel with a lead.  Damage to the heart.  Infection inside the heart (endocarditis).  Allergic reactions to medicines. What happens before the procedure? Staying hydrated Follow instructions  from your health care provider about hydration, which may include:  Up to 2 hours before the procedure - you may continue to drink clear liquids, such as water, clear fruit juice, black coffee, and plain tea. Eating and drinking restrictions Follow instructions from your health care provider about eating and drinking, which may include:  8 hours before the procedure  - stop eating heavy meals or foods such as meat, fried foods, or fatty foods.  6 hours before the procedure - stop eating light meals or foods, such as toast or cereal.  6 hours before the procedure - stop drinking milk or drinks that contain milk.  2 hours before the procedure - stop drinking clear liquids. Medicines  Ask your health care provider about: ? Changing or stopping your regular medicines. This is especially important if you are taking diabetes medicines or blood thinners. ? Taking medicines such as aspirin and ibuprofen. These medicines can thin your blood. Do not take these medicines before your procedure if your health care provider instructs you not to.  You may be given antibiotic medicine to help prevent infection. General instructions  You will have a heart evaluation. This may include an electrocardiogram (ECG), chest X-ray, and heart imaging (echocardiogram,  or echo) tests.  You will have blood tests.  Do not use any products that contain nicotine or tobacco, such as cigarettes and e-cigarettes. If you need help quitting, ask your health care provider.  Plan to have someone take you home from the hospital or clinic.  If you will be going home right after the procedure, plan to have someone with you for 24 hours.  Ask your health care provider how your surgical site will be marked or identified. What happens during the procedure?  To reduce your risk of infection: ? Your health care team will wash or sanitize their hands. ? Your skin will be washed with soap. ? Hair may be removed from the surgical area.  An IV tube will be inserted into one of your veins.  You will be given one or more of the following: ? A medicine to help you relax (sedative). ? A medicine to numb the area (local anesthetic). ? A medicine to make you fall asleep (general anesthetic).  If you are getting a transvenous pacemaker: ? An incision will be made in your upper chest. ? A  pocket will be made for the pacemaker. It may be placed under the skin or between layers of muscle. ? The lead will be inserted into a blood vessel that returns to the heart. ? While X-rays are taken by an imaging machine (fluoroscopy), the lead will be advanced through the vein to the inside of your heart. ? The other end of the lead will be tunneled under the skin and attached to the pacemaker.  If you are getting an epicardial pacemaker: ? An incision will be made near your ribs or breastbone (sternum) for the lead. ? The lead will be attached to the outside of your heart. ? Another incision will be made in your chest or upper belly to create a pocket for the pacemaker. ? The free end of the lead will be tunneled under the skin and attached to the pacemaker.  The transvenous or epicardial pacemaker will be tested. Imaging studies may be done to check the lead position.  The incisions will be closed with stitches (sutures), adhesive strips, or skin glue.  Bandages (dressing) will be placed over the incisions. The procedure may  vary among health care providers and hospitals. What happens after the procedure?  Your blood pressure, heart rate, breathing rate, and blood oxygen level will be monitored until the medicines you were given have worn off.  You will be given antibiotics and pain medicine.  ECG and chest x-rays will be done.  You will wear a continuous type of ECG (Holter monitor) to check your heart rhythm.  Your health care provider will program the pacemaker.  Do not drive for 24 hours if you received a sedative. This information is not intended to replace advice given to you by your health care provider. Make sure you discuss any questions you have with your health care provider. Document Released: 11/13/2002 Document Revised: 08/12/2018 Document Reviewed: 05/06/2016 Elsevier Patient Education  2020 [employer].

## 2019-11-22 NOTE — Telephone Encounter (Signed)
"  I'm scheduled to have surgery on December 14, 2019.  I am calling to see if Dr. Amalia Hailey has had any cancellations for this month.  I need to schedule a surgery to have a pacemaker put in and I'm trying to do that as soon as possible, that's why I'm asking this question."  We do not have any cancellations at this time.  "Can you let me know if anything becomes available?  I'd like to do this as soon as possible."

## 2019-11-22 NOTE — Progress Notes (Signed)
HPI Martin Rubio is referred today by Dr. [facility] for consideration of symptomatic bradycardia due to sinus node dysfunction. He has a h/o PAF with a slow VR. He also has a h/o atrial flutter. He has had periods of second degree AV block in the past. He has not had frank syncope. The patient has had difficult to control bp and is limited in the medications that he can take due to his bradycardia. He wore a cardiac monitor which demonstrated atrial fib with a slow VR, as well as transient heart block and sinus node dysfunction. He has nocturnal as well as morning bradycardia. He has dizziness but no frank syncope. Allergies  Allergen Reactions  . Codeine     Tension, nasty feeling   . Nsaids Other (See Comments)    Renal insufficiency  . Prednisone Other (See Comments)    Unknown/ some times takes  . Statins Other (See Comments)    Joint pain Joint pain Joint pain Joint pain Joint pain   . Sulfa Antibiotics Other (See Comments)    Joint pain     Current Outpatient Medications  Medication Sig Dispense Refill  . alendronate (FOSAMAX) 70 MG tablet Take 1 tablet (70 mg total) by mouth once a week. Take with a full glass of water on an empty stomach. (Patient taking differently: Take 70 mg by mouth once a week. Take with a full glass of water on an empty stomach.  Wednesday) 26 tablet 1  . Alirocumab (PRALUENT) 150 MG/ML SOAJ Inject 150 mg into the skin every 14 (fourteen) days. 6 pen 3  . apixaban (ELIQUIS) 5 MG TABS tablet Take 1 tablet (5 mg total) by mouth 2 (two) times daily. 180 tablet 3  . fluticasone (FLONASE) 50 MCG/ACT nasal spray Place 1 spray into both nostrils as needed for allergies.     Marland Kitchen golimumab 2 mg/kg in sodium chloride 0.9 % Inject 2 mg/kg into the vein every 8 (eight) weeks.    Marland Kitchen levothyroxine (SYNTHROID) 125 MCG tablet TAKE 1 TABLET (125 MCG TOTAL) BY MOUTH DAILY ON AN EMPTY STOMACH (Patient taking differently: Take 125 mcg by mouth daily before breakfast. ) 90  tablet 1  . losartan (COZAAR) 50 MG tablet Take 1 tablet (50 mg total) by mouth 2 (two) times daily. (Patient taking differently: Take 50 mg by mouth every other day. ) 180 tablet 1  . pantoprazole (PROTONIX) 40 MG tablet Take 40 mg by mouth daily.    . sucralfate (CARAFATE) 1 g tablet Take 1 g by mouth 3 (three) times daily.    . tamsulosin (FLOMAX) 0.4 MG CAPS capsule Take 0.4 mg by mouth at bedtime.     . triamcinolone cream (KENALOG) 0.1 % Apply 1 application topically as needed for itching.     No current facility-administered medications for this visit.     Past Medical History:  Diagnosis Date  . Bronchitis   . Diverticulosis   . FH: BPH (benign prostatic hypertrophy)   . History of Graves' disease 01/19/2018   S/p ablation  . HTN (hypertension)   . Hyperlipidemia   . Obesity   . Psoriatic arthritis ([place])   . Rheumatoid arthritis ([place]) 08/07/2014  . Sleep apnea    CPAP  . Thyroid disease     ROS:   All systems reviewed and negative except as noted in the HPI.   Past Surgical History:  Procedure Laterality Date  . INGUINAL HERNIA REPAIR     bilateral  .  KNEE SURGERY     bilateral arthroscopic  . TRANSURETHRAL RESECTION OF PROSTATE    . UMBILICAL HERNIA REPAIR       Family History  Problem Relation Age of Onset  . CAD Father 84       Died age 53  . CAD Mother 47       CABG  . Diabetes Brother      Social History   Socioeconomic History  . Marital status: Married    Spouse name: Not on file  . Number of children: 2  . Years of education: Not on file  . Highest education level: Not on file  Occupational History  . Occupation: Retired    Fish farm manager: [employer]  Tobacco Use  . Smoking status: Former Smoker    Types: Cigarettes  . Smokeless tobacco: Never Used  . Tobacco comment: Quit 30 years ago.  Substance and Sexual Activity  . Alcohol use: No  . Drug use: No  . Sexual activity: Not on file  Other Topics Concern  . Not on file  Social  History Narrative   Lives with wife.   Social Determinants of Health   Financial Resource Strain:   . Difficulty of Paying Living Expenses: Not on file  Food Insecurity:   . Worried About Charity fundraiser in the Last Year: Not on file  . Ran Out of Food in the Last Year: Not on file  Transportation Needs:   . Lack of Transportation (Medical): Not on file  . Lack of Transportation (Non-Medical): Not on file  Physical Activity:   . Days of Exercise per Week: Not on file  . Minutes of Exercise per Session: Not on file  Stress:   . Feeling of Stress : Not on file  Social Connections:   . Frequency of Communication with Friends and Family: Not on file  . Frequency of Social Gatherings with Friends and Family: Not on file  . Attends Religious Services: Not on file  . Active Member of Clubs or Organizations: Not on file  . Attends Archivist Meetings: Not on file  . Marital Status: Not on file  Intimate Partner Violence:   . Fear of Current or Ex-Partner: Not on file  . Emotionally Abused: Not on file  . Physically Abused: Not on file  . Sexually Abused: Not on file     BP (!) 180/90   Pulse 64   Ht 5\' 10"  (1.778 m)   Wt 220 lb (99.8 kg)   SpO2 99%   BMI 31.57 kg/m   Physical Exam:  Well appearing NAD HEENT: Unremarkable Neck:  No JVD, no thyromegally Lymphatics:  No adenopathy Back:  No CVA tenderness Lungs:  Clear with no wheezes HEART:  Regular rate rhythm, no murmurs, no rubs, no clicks Abd:  soft, positive bowel sounds, no organomegally, no rebound, no guarding Ext:  2 plus pulses, no edema, no cyanosis, no clubbing Skin:  No rashes no nodules Neuro:  CN II through XII intact, motor grossly intact  EKG - sinus bradycardia, reviewed  Assess/Plan: 1. PAF - he is maintaining NSR but is also have atrial fib and has a slow VR. I have discussed the treatment options with the patient and recommended insertion of a DDD PM followed by uptitration of his  beta blocker and/or initiation of an AA drug regimen.  2. Coags - his blood thinners will be held prior to the procedure 3. HTN - his bp is not well  controlled. He will have additional medications added after his PPM is inserted.  Mikle Bosworth.D.

## 2019-11-22 NOTE — Telephone Encounter (Signed)
Called and instructed the pt to apply to hw and they were already approved

## 2019-11-22 NOTE — H&P (View-Only) (Signed)
HPI [name] is referred today by Dr. [facility] for consideration of symptomatic bradycardia due to sinus node dysfunction. He has a h/o PAF with a slow VR. He also has a h/o atrial flutter. He has had periods of second degree AV block in the past. He has not had frank syncope. The patient has had difficult to control bp and is limited in the medications that he can take due to his bradycardia. He wore a cardiac monitor which demonstrated atrial fib with a slow VR, as well as transient heart block and sinus node dysfunction. He has nocturnal as well as morning bradycardia. He has dizziness but no frank syncope. Allergies  Allergen Reactions  . Codeine     Tension, nasty feeling   . Nsaids Other (See Comments)    Renal insufficiency  . Prednisone Other (See Comments)    Unknown/ some times takes  . Statins Other (See Comments)    Joint pain Joint pain Joint pain Joint pain Joint pain   . Sulfa Antibiotics Other (See Comments)    Joint pain     Current Outpatient Medications  Medication Sig Dispense Refill  . alendronate (FOSAMAX) 70 MG tablet Take 1 tablet (70 mg total) by mouth once a week. Take with a full glass of water on an empty stomach. (Patient taking differently: Take 70 mg by mouth once a week. Take with a full glass of water on an empty stomach.  Wednesday) 26 tablet 1  . Alirocumab (PRALUENT) 150 MG/ML SOAJ Inject 150 mg into the skin every 14 (fourteen) days. 6 pen 3  . apixaban (ELIQUIS) 5 MG TABS tablet Take 1 tablet (5 mg total) by mouth 2 (two) times daily. 180 tablet 3  . fluticasone (FLONASE) 50 MCG/ACT nasal spray Place 1 spray into both nostrils as needed for allergies.     Marland Kitchen golimumab 2 mg/kg in sodium chloride 0.9 % Inject 2 mg/kg into the vein every 8 (eight) weeks.    Marland Kitchen levothyroxine (SYNTHROID) 125 MCG tablet TAKE 1 TABLET (125 MCG TOTAL) BY MOUTH DAILY ON AN EMPTY STOMACH (Patient taking differently: Take 125 mcg by mouth daily before breakfast. ) 90  tablet 1  . losartan (COZAAR) 50 MG tablet Take 1 tablet (50 mg total) by mouth 2 (two) times daily. (Patient taking differently: Take 50 mg by mouth every other day. ) 180 tablet 1  . pantoprazole (PROTONIX) 40 MG tablet Take 40 mg by mouth daily.    . sucralfate (CARAFATE) 1 g tablet Take 1 g by mouth 3 (three) times daily.    . tamsulosin (FLOMAX) 0.4 MG CAPS capsule Take 0.4 mg by mouth at bedtime.     . triamcinolone cream (KENALOG) 0.1 % Apply 1 application topically as needed for itching.     No current facility-administered medications for this visit.     Past Medical History:  Diagnosis Date  . Bronchitis   . Diverticulosis   . FH: BPH (benign prostatic hypertrophy)   . History of Graves' disease 01/19/2018   S/p ablation  . HTN (hypertension)   . Hyperlipidemia   . Obesity   . Psoriatic arthritis ([place])   . Rheumatoid arthritis ([place]) 08/07/2014  . Sleep apnea    CPAP  . Thyroid disease     ROS:   All systems reviewed and negative except as noted in the HPI.   Past Surgical History:  Procedure Laterality Date  . INGUINAL HERNIA REPAIR     bilateral  .  KNEE SURGERY     bilateral arthroscopic  . TRANSURETHRAL RESECTION OF PROSTATE    . UMBILICAL HERNIA REPAIR       Family History  Problem Relation Age of Onset  . CAD Father 41       Died age 29  . CAD Mother 18       CABG  . Diabetes Brother      Social History   Socioeconomic History  . Marital status: Married    Spouse name: Not on file  . Number of children: 2  . Years of education: Not on file  . Highest education level: Not on file  Occupational History  . Occupation: Retired    Fish farm manager: [employer]  Tobacco Use  . Smoking status: Former Smoker    Types: Cigarettes  . Smokeless tobacco: Never Used  . Tobacco comment: Quit 30 years ago.  Substance and Sexual Activity  . Alcohol use: No  . Drug use: No  . Sexual activity: Not on file  Other Topics Concern  . Not on file  Social  History Narrative   Lives with wife.   Social Determinants of Health   Financial Resource Strain:   . Difficulty of Paying Living Expenses: Not on file  Food Insecurity:   . Worried About Charity fundraiser in the Last Year: Not on file  . Ran Out of Food in the Last Year: Not on file  Transportation Needs:   . Lack of Transportation (Medical): Not on file  . Lack of Transportation (Non-Medical): Not on file  Physical Activity:   . Days of Exercise per Week: Not on file  . Minutes of Exercise per Session: Not on file  Stress:   . Feeling of Stress : Not on file  Social Connections:   . Frequency of Communication with Friends and Family: Not on file  . Frequency of Social Gatherings with Friends and Family: Not on file  . Attends Religious Services: Not on file  . Active Member of Clubs or Organizations: Not on file  . Attends Archivist Meetings: Not on file  . Marital Status: Not on file  Intimate Partner Violence:   . Fear of Current or Ex-Partner: Not on file  . Emotionally Abused: Not on file  . Physically Abused: Not on file  . Sexually Abused: Not on file     BP (!) 180/90   Pulse 64   Ht 5\' 10"  (1.778 m)   Wt 220 lb (99.8 kg)   SpO2 99%   BMI 31.57 kg/m   Physical Exam:  Well appearing NAD HEENT: Unremarkable Neck:  No JVD, no thyromegally Lymphatics:  No adenopathy Back:  No CVA tenderness Lungs:  Clear with no wheezes HEART:  Regular rate rhythm, no murmurs, no rubs, no clicks Abd:  soft, positive bowel sounds, no organomegally, no rebound, no guarding Ext:  2 plus pulses, no edema, no cyanosis, no clubbing Skin:  No rashes no nodules Neuro:  CN II through XII intact, motor grossly intact  EKG - sinus bradycardia, reviewed  Assess/Plan: 1. PAF - he is maintaining NSR but is also have atrial fib and has a slow VR. I have discussed the treatment options with the patient and recommended insertion of a DDD PM followed by uptitration of his  beta blocker and/or initiation of an AA drug regimen.  2. Coags - his blood thinners will be held prior to the procedure 3. HTN - his bp is not well  controlled. He will have additional medications added after his PPM is inserted.  Mikle Bosworth.D.

## 2019-11-27 ENCOUNTER — Encounter: Payer: Self-pay | Admitting: *Deleted

## 2019-11-27 NOTE — Progress Notes (Signed)
Per Dr. Amalia Hailey, is sent a surgical medical clearance request letter to Dr. [name].  Martin Rubio is scheduled for surgery on 12/14/2019.

## 2019-11-27 NOTE — Progress Notes (Signed)
The patient can hold anticoagulation prior to foot surgery and then resume when felt OK per the operating provider.  He will be calling to schedule a pacemaker after this.  Of note he has no absolute cardiovascular contraindication to the planned surgery.  However, HR should be monitored closely given his propensity for atrial fib and bradycardia.

## 2019-11-28 ENCOUNTER — Telehealth: Payer: Self-pay | Admitting: Internal Medicine

## 2019-11-28 DIAGNOSIS — I480 Paroxysmal atrial fibrillation: Secondary | ICD-10-CM

## 2019-11-28 NOTE — Telephone Encounter (Signed)
Patient states that he spoke with Sonia Baller in regards to getting a pacemaker per his last appt with Dr. [name] on 11/22/19. He is ready to be scheduled for that now.

## 2019-11-29 NOTE — Telephone Encounter (Signed)
Spoke with pt re: insertion of new pacemaker and scheduled for 12/13/2019 with Dr [name].  Procedure at 11am with arrival to short stay at [facility].  Reviewed instructions with pt and advised to hold Eliquis 2 days prior to procedure with last dose to be taken on the evening of January 3,2021.  Pt has already received surgical scrub with instructions during 12/16 OV.  Lab appointment scheduled for 12/06/2019 and Covid screening appointment scheduled for 12/09/2019 at 925am.  Instruction letter sen via My Chart and copy of letter placed for pt pickup.

## 2019-11-29 NOTE — Telephone Encounter (Signed)
Dr. [name] cleared Mr. Jansma for surgery.  He said Mr Gasper could hold the anticoagulant prior to his surgery then resume when felt okay per the operating provider.  He said Mr. Vickrey will be scheduling a pacemaker procedure after this surgery.  He also stated Mr. Fallert does not have an absolute cardiovascular contraindication to the planned surgery.  However, his heart rate should be monitored closely given his propensity for atrial fibrillation and bradycardia.  "I'm scheduled to have surgery.  I want to cancel it.  I have some other health concerns that I need to address first because they are more pressing.  I need a pacemaker, so, I want to hold off until February, at least I hope I'll be better by then.  I was told it would take about six weeks to recover.  I won't reschedule it at this time to make sure I'm better before proceeding."  I understand.  I will cancel your surgery.  I hope everything goes well for you.  "Thank you so much."  I called Caren Griffins at [facility] and canceled Mr. Massengale's surgery for 12/14/2019.

## 2019-12-06 ENCOUNTER — Other Ambulatory Visit: Payer: Self-pay

## 2019-12-06 ENCOUNTER — Other Ambulatory Visit: Payer: Medicare Other | Admitting: *Deleted

## 2019-12-06 DIAGNOSIS — I480 Paroxysmal atrial fibrillation: Secondary | ICD-10-CM

## 2019-12-06 LAB — CBC WITH DIFFERENTIAL/PLATELET
Basophils Absolute: 0.1 10*3/uL (ref 0.0–0.2)
Basos: 1 %
EOS (ABSOLUTE): 0.3 10*3/uL (ref 0.0–0.4)
Eos: 6 %
Hematocrit: 39.2 % (ref 37.5–51.0)
Hemoglobin: 12.3 g/dL — ABNORMAL LOW (ref 13.0–17.7)
Immature Grans (Abs): 0 10*3/uL (ref 0.0–0.1)
Immature Granulocytes: 0 %
Lymphocytes Absolute: 1.7 10*3/uL (ref 0.7–3.1)
Lymphs: 29 %
MCH: 22.9 pg — ABNORMAL LOW (ref 26.6–33.0)
MCHC: 31.4 g/dL — ABNORMAL LOW (ref 31.5–35.7)
MCV: 73 fL — ABNORMAL LOW (ref 79–97)
Monocytes Absolute: 0.8 10*3/uL (ref 0.1–0.9)
Monocytes: 13 %
Neutrophils Absolute: 3.1 10*3/uL (ref 1.4–7.0)
Neutrophils: 51 %
Platelets: 190 10*3/uL (ref 150–450)
RBC: 5.36 x10E6/uL (ref 4.14–5.80)
RDW: 16 % — ABNORMAL HIGH (ref 11.6–15.4)
WBC: 6 10*3/uL (ref 3.4–10.8)

## 2019-12-06 LAB — BASIC METABOLIC PANEL
BUN/Creatinine Ratio: 10 (ref 10–24)
BUN: 15 mg/dL (ref 8–27)
CO2: 25 mmol/L (ref 20–29)
Calcium: 9.8 mg/dL (ref 8.6–10.2)
Chloride: 103 mmol/L (ref 96–106)
Creatinine, Ser: 1.53 mg/dL — ABNORMAL HIGH (ref 0.76–1.27)
GFR calc Af Amer: 51 mL/min/{1.73_m2} — ABNORMAL LOW (ref >59)
GFR calc non Af Amer: 44 mL/min/{1.73_m2} — ABNORMAL LOW (ref >59)
Glucose: 87 mg/dL (ref 65–99)
Potassium: 4.5 mmol/L (ref 3.5–5.2)
Sodium: 141 mmol/L (ref 134–144)

## 2019-12-09 ENCOUNTER — Other Ambulatory Visit (HOSPITAL_COMMUNITY)
Admission: RE | Admit: 2019-12-09 | Discharge: 2019-12-09 | Disposition: A | Discharge status: Home / Self Care | Payer: Medicare Other | Source: Ambulatory Visit | Attending: Internal Medicine | Admitting: Internal Medicine

## 2019-12-09 DIAGNOSIS — Z01812 Encounter for preprocedural laboratory examination: Secondary | ICD-10-CM | POA: Insufficient documentation

## 2019-12-09 DIAGNOSIS — Z20822 Contact with and (suspected) exposure to covid-19: Secondary | ICD-10-CM | POA: Diagnosis not present

## 2019-12-10 LAB — NOVEL CORONAVIRUS, NAA (HOSP ORDER, SEND-OUT TO REF LAB; TAT 18-24 HRS): SARS-CoV-2, NAA: NOT DETECTED

## 2019-12-12 ENCOUNTER — Encounter (HOSPITAL_COMMUNITY): Payer: Self-pay | Admitting: Certified Registered"

## 2019-12-13 ENCOUNTER — Inpatient Hospital Stay (HOSPITAL_COMMUNITY)
Admission: RE | Disposition: A | Discharge status: Home / Self Care | Payer: Self-pay | Source: Home / Self Care | Attending: Internal Medicine

## 2019-12-13 ENCOUNTER — Ambulatory Visit (HOSPITAL_COMMUNITY)
Admission: RE | Admit: 2019-12-13 | Discharge: 2019-12-13 | Disposition: A | Discharge status: Home / Self Care | Payer: Medicare Other | Source: Home / Self Care | Attending: Internal Medicine | Admitting: Internal Medicine

## 2019-12-13 ENCOUNTER — Other Ambulatory Visit: Payer: Self-pay

## 2019-12-13 ENCOUNTER — Encounter (HOSPITAL_COMMUNITY): Payer: Self-pay | Admitting: Internal Medicine

## 2019-12-13 ENCOUNTER — Ambulatory Visit (HOSPITAL_COMMUNITY): Payer: Medicare Other

## 2019-12-13 DIAGNOSIS — E785 Hyperlipidemia, unspecified: Secondary | ICD-10-CM | POA: Insufficient documentation

## 2019-12-13 DIAGNOSIS — I4891 Unspecified atrial fibrillation: Secondary | ICD-10-CM

## 2019-12-13 DIAGNOSIS — I129 Hypertensive chronic kidney disease with stage 1 through stage 4 chronic kidney disease, or unspecified chronic kidney disease: Secondary | ICD-10-CM | POA: Diagnosis not present

## 2019-12-13 DIAGNOSIS — I495 Sick sinus syndrome: Secondary | ICD-10-CM

## 2019-12-13 DIAGNOSIS — R7989 Other specified abnormal findings of blood chemistry: Secondary | ICD-10-CM | POA: Diagnosis not present

## 2019-12-13 DIAGNOSIS — Z66 Do not resuscitate: Secondary | ICD-10-CM | POA: Diagnosis not present

## 2019-12-13 DIAGNOSIS — E039 Hypothyroidism, unspecified: Secondary | ICD-10-CM | POA: Diagnosis not present

## 2019-12-13 DIAGNOSIS — R0781 Pleurodynia: Secondary | ICD-10-CM | POA: Diagnosis not present

## 2019-12-13 DIAGNOSIS — I480 Paroxysmal atrial fibrillation: Secondary | ICD-10-CM | POA: Diagnosis present

## 2019-12-13 DIAGNOSIS — E669 Obesity, unspecified: Secondary | ICD-10-CM | POA: Insufficient documentation

## 2019-12-13 DIAGNOSIS — Z87891 Personal history of nicotine dependence: Secondary | ICD-10-CM | POA: Insufficient documentation

## 2019-12-13 DIAGNOSIS — R079 Chest pain, unspecified: Secondary | ICD-10-CM | POA: Diagnosis not present

## 2019-12-13 DIAGNOSIS — Z20822 Contact with and (suspected) exposure to covid-19: Secondary | ICD-10-CM | POA: Diagnosis not present

## 2019-12-13 DIAGNOSIS — G4730 Sleep apnea, unspecified: Secondary | ICD-10-CM | POA: Insufficient documentation

## 2019-12-13 DIAGNOSIS — Z79890 Hormone replacement therapy (postmenopausal): Secondary | ICD-10-CM | POA: Insufficient documentation

## 2019-12-13 DIAGNOSIS — I441 Atrioventricular block, second degree: Secondary | ICD-10-CM | POA: Diagnosis not present

## 2019-12-13 DIAGNOSIS — Z7901 Long term (current) use of anticoagulants: Secondary | ICD-10-CM | POA: Insufficient documentation

## 2019-12-13 DIAGNOSIS — Z950 Presence of cardiac pacemaker: Secondary | ICD-10-CM

## 2019-12-13 DIAGNOSIS — I10 Essential (primary) hypertension: Secondary | ICD-10-CM | POA: Insufficient documentation

## 2019-12-13 DIAGNOSIS — R42 Dizziness and giddiness: Secondary | ICD-10-CM | POA: Diagnosis not present

## 2019-12-13 DIAGNOSIS — Z6831 Body mass index (BMI) 31.0-31.9, adult: Secondary | ICD-10-CM | POA: Insufficient documentation

## 2019-12-13 DIAGNOSIS — R0789 Other chest pain: Secondary | ICD-10-CM | POA: Diagnosis not present

## 2019-12-13 DIAGNOSIS — R778 Other specified abnormalities of plasma proteins: Secondary | ICD-10-CM | POA: Diagnosis not present

## 2019-12-13 DIAGNOSIS — M069 Rheumatoid arthritis, unspecified: Secondary | ICD-10-CM | POA: Insufficient documentation

## 2019-12-13 DIAGNOSIS — N184 Chronic kidney disease, stage 4 (severe): Secondary | ICD-10-CM | POA: Diagnosis not present

## 2019-12-13 DIAGNOSIS — Z79899 Other long term (current) drug therapy: Secondary | ICD-10-CM | POA: Insufficient documentation

## 2019-12-13 DIAGNOSIS — G4733 Obstructive sleep apnea (adult) (pediatric): Secondary | ICD-10-CM | POA: Diagnosis not present

## 2019-12-13 DIAGNOSIS — E079 Disorder of thyroid, unspecified: Secondary | ICD-10-CM | POA: Insufficient documentation

## 2019-12-13 HISTORY — PX: PACEMAKER IMPLANT: EP1218

## 2019-12-13 SURGERY — PACEMAKER IMPLANT

## 2019-12-13 MED ORDER — MIDAZOLAM HCL 5 MG/5ML IJ SOLN
INTRAMUSCULAR | Status: DC | PRN
  Administered 2019-12-13 (×4): 1 mg via INTRAVENOUS

## 2019-12-13 MED ORDER — ACETAMINOPHEN 325 MG PO TABS
ORAL_TABLET | ORAL | Status: AC
  Filled 2019-12-13: qty 2

## 2019-12-13 MED ORDER — FENTANYL CITRATE (PF) 100 MCG/2ML IJ SOLN
INTRAMUSCULAR | Status: AC
  Filled 2019-12-13: qty 2

## 2019-12-13 MED ORDER — SODIUM CHLORIDE 0.9 % IV SOLN: INTRAVENOUS | Status: DC | PRN

## 2019-12-13 MED ORDER — SODIUM CHLORIDE 0.9 % IV SOLN
80.0000 mg | INTRAVENOUS | Status: AC
  Administered 2019-12-13: 80 mg

## 2019-12-13 MED ORDER — MIDAZOLAM HCL 5 MG/5ML IJ SOLN
INTRAMUSCULAR | Status: AC
  Filled 2019-12-13: qty 5

## 2019-12-13 MED ORDER — ONDANSETRON HCL 4 MG/2ML IJ SOLN: 4.0000 mg | Freq: Four times a day (QID) | INTRAMUSCULAR | Status: DC | PRN

## 2019-12-13 MED ORDER — CEFAZOLIN SODIUM-DEXTROSE 2-4 GM/100ML-% IV SOLN
INTRAVENOUS | Status: AC
  Filled 2019-12-13: qty 100

## 2019-12-13 MED ORDER — LIDOCAINE HCL (PF) 1 % IJ SOLN
INTRAMUSCULAR | Status: DC | PRN
  Administered 2019-12-13: 60 mL

## 2019-12-13 MED ORDER — LIDOCAINE HCL (PF) 1 % IJ SOLN
INTRAMUSCULAR | Status: AC
  Filled 2019-12-13: qty 60

## 2019-12-13 MED ORDER — CEFAZOLIN SODIUM-DEXTROSE 2-4 GM/100ML-% IV SOLN
2.0000 g | INTRAVENOUS | Status: AC
  Administered 2019-12-13: 2 g via INTRAVENOUS

## 2019-12-13 MED ORDER — SODIUM CHLORIDE 0.9 % IV SOLN
INTRAVENOUS | Status: AC
  Filled 2019-12-13: qty 2

## 2019-12-13 MED ORDER — SODIUM CHLORIDE 0.9 % IV SOLN: INTRAVENOUS | Status: DC

## 2019-12-13 MED ORDER — CEFAZOLIN SODIUM-DEXTROSE 1-4 GM/50ML-% IV SOLN: 1.0000 g | Freq: Once | INTRAVENOUS | Status: DC

## 2019-12-13 MED ORDER — FENTANYL CITRATE (PF) 100 MCG/2ML IJ SOLN
INTRAMUSCULAR | Status: DC | PRN
  Administered 2019-12-13 (×4): 12.5 ug via INTRAVENOUS

## 2019-12-13 MED ORDER — HEPARIN (PORCINE) IN NACL 1000-0.9 UT/500ML-% IV SOLN
INTRAVENOUS | Status: AC
  Filled 2019-12-13: qty 500

## 2019-12-13 MED ORDER — HEPARIN (PORCINE) IN NACL 1000-0.9 UT/500ML-% IV SOLN
INTRAVENOUS | Status: DC | PRN
  Administered 2019-12-13: 500 mL

## 2019-12-13 MED ORDER — CHLORHEXIDINE GLUCONATE 4 % EX LIQD: 4.0000 "application " | Freq: Once | CUTANEOUS | Status: DC

## 2019-12-13 MED ORDER — APIXABAN 5 MG PO TABS: 5.0000 mg | ORAL_TABLET | Freq: Two times a day (BID) | ORAL | 3 refills | Status: DC

## 2019-12-13 MED ORDER — ACETAMINOPHEN 325 MG PO TABS
325.0000 mg | ORAL_TABLET | ORAL | Status: DC | PRN
  Administered 2019-12-13: 650 mg via ORAL

## 2019-12-13 SURGICAL SUPPLY — 12 items

## 2019-12-13 NOTE — Interval H&P Note (Signed)
History and Physical Interval Note:  12/13/2019 10:28 AM  Martin Rubio  has presented today for surgery, with the diagnosis of atrial fibrillation.  The various methods of treatment have been discussed with the patient and family. After consideration of risks, benefits and other options for treatment, the patient has consented to  Procedure(s): PACEMAKER IMPLANT (N/A) as a surgical intervention.  The patient's history has been reviewed, patient examined, no change in status, stable for surgery.  I have reviewed the patient's chart and labs.  Questions were answered to the patient's satisfaction.     Cristopher Peru

## 2019-12-13 NOTE — Discharge Instructions (Signed)
Pacemaker Implantation, Adult, Care After This sheet gives you information about how to care for yourself after your procedure. Your health care provider may also give you more specific instructions. If you have problems or questions, contact your health care provider. What can I expect after the procedure? After the procedure, it is common to have:  Mild pain.  Slight bruising.  Some swelling over the incision.  A slight bump over the skin where the device was placed. Sometimes, it is possible to feel the device under the skin. This is normal. Follow these instructions at home: Medicines  Take over-the-counter and prescription medicines only as told by your health care provider.  If you were prescribed an antibiotic medicine, take it as told by your health care provider. Do not stop taking the antibiotic even if you start to feel better. Wound care   Do not remove the bandage on your chest until directed to do so by your health care provider.  After your bandage is removed, you may see pieces of tape called skin adhesive strips over the area where the cut was made (incision site). Let them fall off on their own.  Check the incision site every day to make sure it is not infected, bleeding, or starting to pull apart.  Do not use lotions or ointments near the incision site unless directed to do so.  Keep the incision area clean and dry for 2-3 days after the procedure or as directed by your health care provider. It takes several weeks for the incision site to completely heal.  Do not take baths, swim, or use a hot tub for 7-10 days or as otherwise directed by your health care provider. Activity  Do not drive or use heavy machinery while taking prescription pain medicine.  Do not drive for 24 hours if you were given a medicine to help you relax (sedative).  Check with your health care provider before you start to drive or play sports.  Avoid sudden jerking, pulling, or chopping  movements that pull your upper arm far away from your body. Avoid these movements for at least 6 weeks or as long as told by your health care provider.  Do not lift your upper arm above your shoulders for at least 6 weeks or as long as told by your health care provider. This means no tennis, golf, or swimming.  You may go back to work when your health care provider says it is okay. Pacemaker care  You may be shown how to transfer data from your pacemaker through the phone to your health care provider.  Always let all health care providers know about your pacemaker before you have any medical procedures or tests.  Wear a medical ID bracelet or necklace stating that you have a pacemaker. Carry a pacemaker ID card with you at all times.  Your pacemaker battery will last for 5-15 years. Routine checks by your health care provider will let the health care provider know when the battery is starting to run down. The pacemaker will need to be replaced when the battery starts to run down.  Do not use amateur Chief of Staff. Other electrical devices are safe to use, including power tools, lawn mowers, and speakers. If you are unsure of whether something is safe to use, ask your health care provider.  When using your cell phone, hold it to the ear opposite the pacemaker. Do not leave your cell phone in a pocket over the pacemaker.  Avoid places or objects that have a strong electric or magnetic field, including: ? Airport Herbalist. When at the airport, let officials know that you have a pacemaker. ? Power plants. ? Large electrical generators. ? Radiofrequency transmission towers, such as cell phone and radio towers. General instructions  Weigh yourself every day. If you suddenly gain weight, fluid may be building up in your body.  Keep all follow-up visits as told by your health care provider. This is important. Contact a health care provider if:  You gain  weight suddenly.  Your legs or feet swell.  It feels like your heart is fluttering or skipping beats (heart palpitations).  You have chills or a fever.  You have more redness, swelling, or pain around your incisions.  You have more fluid or blood coming from your incisions.  Your incisions feel warm to the touch.  You have pus or a bad smell coming from your incisions. Get help right away if:  You have chest pain.  You have trouble breathing or are short of breath.  You become extremely tired.  You are light-headed or you faint. This information is not intended to replace advice given to you by your health care provider. Make sure you discuss any questions you have with your health care provider. Document Revised: 11/05/2017 Document Reviewed: 09/04/2016 Elsevier Patient Education  2020 [employer].

## 2019-12-14 MED FILL — Gentamicin Sulfate Inj 40 MG/ML: INTRAMUSCULAR | Qty: 80 | Status: AC

## 2019-12-15 ENCOUNTER — Emergency Department (HOSPITAL_BASED_OUTPATIENT_CLINIC_OR_DEPARTMENT_OTHER): Payer: Medicare Other

## 2019-12-15 ENCOUNTER — Telehealth: Payer: Self-pay | Admitting: Internal Medicine

## 2019-12-15 ENCOUNTER — Inpatient Hospital Stay (HOSPITAL_BASED_OUTPATIENT_CLINIC_OR_DEPARTMENT_OTHER)
Admission: EM | Admit: 2019-12-15 | Discharge: 2019-12-17 | DRG: 243 | Disposition: A | Discharge status: Home / Self Care | Payer: Medicare Other | Attending: Internal Medicine | Admitting: Internal Medicine

## 2019-12-15 ENCOUNTER — Encounter (HOSPITAL_BASED_OUTPATIENT_CLINIC_OR_DEPARTMENT_OTHER): Payer: Self-pay

## 2019-12-15 ENCOUNTER — Other Ambulatory Visit: Payer: Self-pay

## 2019-12-15 DIAGNOSIS — E039 Hypothyroidism, unspecified: Secondary | ICD-10-CM | POA: Diagnosis present

## 2019-12-15 DIAGNOSIS — R079 Chest pain, unspecified: Secondary | ICD-10-CM | POA: Diagnosis not present

## 2019-12-15 DIAGNOSIS — I129 Hypertensive chronic kidney disease with stage 1 through stage 4 chronic kidney disease, or unspecified chronic kidney disease: Secondary | ICD-10-CM | POA: Diagnosis present

## 2019-12-15 DIAGNOSIS — N184 Chronic kidney disease, stage 4 (severe): Secondary | ICD-10-CM

## 2019-12-15 DIAGNOSIS — G4733 Obstructive sleep apnea (adult) (pediatric): Secondary | ICD-10-CM | POA: Diagnosis present

## 2019-12-15 DIAGNOSIS — E785 Hyperlipidemia, unspecified: Secondary | ICD-10-CM | POA: Diagnosis present

## 2019-12-15 DIAGNOSIS — Z66 Do not resuscitate: Secondary | ICD-10-CM | POA: Diagnosis present

## 2019-12-15 DIAGNOSIS — I495 Sick sinus syndrome: Secondary | ICD-10-CM

## 2019-12-15 DIAGNOSIS — Z87891 Personal history of nicotine dependence: Secondary | ICD-10-CM

## 2019-12-15 DIAGNOSIS — R0789 Other chest pain: Principal | ICD-10-CM | POA: Diagnosis present

## 2019-12-15 DIAGNOSIS — Z7983 Long term (current) use of bisphosphonates: Secondary | ICD-10-CM

## 2019-12-15 DIAGNOSIS — N400 Enlarged prostate without lower urinary tract symptoms: Secondary | ICD-10-CM | POA: Diagnosis present

## 2019-12-15 DIAGNOSIS — Z7901 Long term (current) use of anticoagulants: Secondary | ICD-10-CM

## 2019-12-15 DIAGNOSIS — Z79890 Hormone replacement therapy (postmenopausal): Secondary | ICD-10-CM

## 2019-12-15 DIAGNOSIS — Z20822 Contact with and (suspected) exposure to covid-19: Secondary | ICD-10-CM | POA: Diagnosis present

## 2019-12-15 DIAGNOSIS — Z8249 Family history of ischemic heart disease and other diseases of the circulatory system: Secondary | ICD-10-CM

## 2019-12-15 DIAGNOSIS — I480 Paroxysmal atrial fibrillation: Secondary | ICD-10-CM | POA: Diagnosis present

## 2019-12-15 DIAGNOSIS — Z833 Family history of diabetes mellitus: Secondary | ICD-10-CM

## 2019-12-15 DIAGNOSIS — Z950 Presence of cardiac pacemaker: Secondary | ICD-10-CM

## 2019-12-15 DIAGNOSIS — N138 Other obstructive and reflux uropathy: Secondary | ICD-10-CM | POA: Diagnosis present

## 2019-12-15 DIAGNOSIS — R7989 Other specified abnormal findings of blood chemistry: Secondary | ICD-10-CM | POA: Diagnosis not present

## 2019-12-15 DIAGNOSIS — I441 Atrioventricular block, second degree: Secondary | ICD-10-CM | POA: Diagnosis present

## 2019-12-15 DIAGNOSIS — R778 Other specified abnormalities of plasma proteins: Secondary | ICD-10-CM | POA: Diagnosis present

## 2019-12-15 DIAGNOSIS — N1832 Chronic kidney disease, stage 3b: Secondary | ICD-10-CM

## 2019-12-15 LAB — CBC WITH DIFFERENTIAL/PLATELET
Abs Immature Granulocytes: 0.04 10*3/uL (ref 0.00–0.07)
Basophils Absolute: 0.1 10*3/uL (ref 0.0–0.1)
Basophils Relative: 1 %
Eosinophils Absolute: 0.4 10*3/uL (ref 0.0–0.5)
Eosinophils Relative: 4 %
HCT: 45 % (ref 39.0–52.0)
Hemoglobin: 13.4 g/dL (ref 13.0–17.0)
Immature Granulocytes: 0 %
Lymphocytes Relative: 22 %
Lymphs Abs: 2.1 10*3/uL (ref 0.7–4.0)
MCH: 22.8 pg — ABNORMAL LOW (ref 26.0–34.0)
MCHC: 29.8 g/dL — ABNORMAL LOW (ref 30.0–36.0)
MCV: 76.5 fL — ABNORMAL LOW (ref 80.0–100.0)
Monocytes Absolute: 1.2 10*3/uL — ABNORMAL HIGH (ref 0.1–1.0)
Monocytes Relative: 12 %
Neutro Abs: 5.7 10*3/uL (ref 1.7–7.7)
Neutrophils Relative %: 61 %
Platelets: 197 10*3/uL (ref 150–400)
RBC: 5.88 MIL/uL — ABNORMAL HIGH (ref 4.22–5.81)
RDW: 16.1 % — ABNORMAL HIGH (ref 11.5–15.5)
WBC: 9.4 10*3/uL (ref 4.0–10.5)
nRBC: 0 % (ref 0.0–0.2)

## 2019-12-15 LAB — BASIC METABOLIC PANEL
Anion gap: 10 (ref 5–15)
BUN: 20 mg/dL (ref 8–23)
CO2: 28 mmol/L (ref 22–32)
Calcium: 9.5 mg/dL (ref 8.9–10.3)
Chloride: 102 mmol/L (ref 98–111)
Creatinine, Ser: 1.46 mg/dL — ABNORMAL HIGH (ref 0.61–1.24)
GFR calc Af Amer: 54 mL/min — ABNORMAL LOW (ref >60)
GFR calc non Af Amer: 46 mL/min — ABNORMAL LOW (ref >60)
Glucose, Bld: 71 mg/dL (ref 70–99)
Potassium: 4.1 mmol/L (ref 3.5–5.1)
Sodium: 140 mmol/L (ref 135–145)

## 2019-12-15 LAB — TROPONIN I (HIGH SENSITIVITY)
Troponin I (High Sensitivity): 133 ng/L (ref <18)
Troponin I (High Sensitivity): 116 ng/L (ref <18)
Troponin I (High Sensitivity): 129 ng/L (ref <18)

## 2019-12-15 LAB — D-DIMER, QUANTITATIVE: D-Dimer, Quant: 1.54 ug/mL-FEU — ABNORMAL HIGH (ref 0.00–0.50)

## 2019-12-15 MED ORDER — TAMSULOSIN HCL 0.4 MG PO CAPS
0.4000 mg | ORAL_CAPSULE | Freq: Every day | ORAL | Status: DC
  Administered 2019-12-15 – 2019-12-16 (×2): 0.4 mg via ORAL
  Filled 2019-12-15 (×2): qty 1

## 2019-12-15 MED ORDER — ZINC SULFATE 220 (50 ZN) MG PO CAPS
220.0000 mg | ORAL_CAPSULE | Freq: Every day | ORAL | Status: DC
  Filled 2019-12-15 (×3): qty 1

## 2019-12-15 MED ORDER — IOHEXOL 350 MG/ML SOLN
100.0000 mL | Freq: Once | INTRAVENOUS | Status: AC | PRN
  Administered 2019-12-15: 80 mL via INTRAVENOUS

## 2019-12-15 MED ORDER — LOSARTAN POTASSIUM 25 MG PO TABS
50.0000 mg | ORAL_TABLET | Freq: Once | ORAL | Status: AC
  Administered 2019-12-15: 50 mg via ORAL
  Filled 2019-12-15: qty 2

## 2019-12-15 MED ORDER — SODIUM CHLORIDE 0.9 % IV BOLUS
500.0000 mL | Freq: Once | INTRAVENOUS | Status: AC
  Administered 2019-12-15: 500 mL via INTRAVENOUS

## 2019-12-15 MED ORDER — MELATONIN 5 MG PO TABS
5.0000 mg | ORAL_TABLET | Freq: Every day | ORAL | Status: DC
  Filled 2019-12-15 (×2): qty 1

## 2019-12-15 MED ORDER — VITAMIN D 25 MCG (1000 UNIT) PO TABS
5000.0000 [IU] | ORAL_TABLET | Freq: Every day | ORAL | Status: DC
  Administered 2019-12-17: 5000 [IU] via ORAL
  Filled 2019-12-15 (×2): qty 5

## 2019-12-15 MED ORDER — LEVOTHYROXINE SODIUM 25 MCG PO TABS
125.0000 ug | ORAL_TABLET | Freq: Every day | ORAL | Status: DC
  Administered 2019-12-16 – 2019-12-17 (×2): 125 ug via ORAL
  Filled 2019-12-15 (×2): qty 1

## 2019-12-15 MED ORDER — ASPIRIN 81 MG PO CHEW
324.0000 mg | CHEWABLE_TABLET | Freq: Once | ORAL | Status: AC
  Administered 2019-12-15: 324 mg via ORAL
  Filled 2019-12-15: qty 4

## 2019-12-15 MED ORDER — FLUTICASONE PROPIONATE 50 MCG/ACT NA SUSP
1.0000 | NASAL | Status: DC | PRN
  Filled 2019-12-15: qty 16

## 2019-12-15 MED ORDER — PANTOPRAZOLE SODIUM 40 MG PO TBEC
40.0000 mg | DELAYED_RELEASE_TABLET | Freq: Every day | ORAL | Status: DC
  Administered 2019-12-16 – 2019-12-17 (×2): 40 mg via ORAL
  Filled 2019-12-15 (×2): qty 1

## 2019-12-15 MED ORDER — LOSARTAN POTASSIUM 50 MG PO TABS
50.0000 mg | ORAL_TABLET | Freq: Two times a day (BID) | ORAL | Status: DC
  Administered 2019-12-16 – 2019-12-17 (×3): 50 mg via ORAL
  Filled 2019-12-15: qty 1
  Filled 2019-12-15: qty 2
  Filled 2019-12-15: qty 1

## 2019-12-15 NOTE — Telephone Encounter (Signed)
LM TO CALL BACK ./CY 

## 2019-12-15 NOTE — Telephone Encounter (Signed)
Spoke with patient. He reports that around 05:30 today, he began having sharp, shooting chest pains in the center of his chest, pains eventually moved to right upper chest and dissipated after ~52min. Associated with some dizziness and SOB as well as right shoulder pain. Pt reports chest pain was not worse with inspiration. Similar episode occurred again later this morning (unable to recall exact time), lasted about 13min. Reports breathing is at baseline now, no current chest discomfort or dizziness. BP 158/95 at 09:42 (prior to taking losartan 50mg ), now 154/88. Has been taking losartan 50mg  BID since returning home from the hospital on 1/6 due to elevated BPs. Will route to [facility] Triage team for further assistance/advisement. ED precautions reviewed for any worsening symptoms.  S/p PPM implant at left chest on 12/13/19. Steri-strips intact per wife, no signs of drainage, redness, swelling. Taking acetaminophen every 4hrs for pain management. No concerns with PPM site or left arm/chest. PPM manual transmission reviewed. Normal device function. Lead trends stable. Presenting rhythm AS/VP to AP/VP 60s-70s. 1 AF episode on 12/14/19, 2hr 41min duration. Pt reports he was asymptomatic with AF episode. Eliquis on hold per discharge instructions until 1/10 per patient. Reassured pt of normal PPM function. No further PPM-related questions at this time.

## 2019-12-15 NOTE — ED Triage Notes (Signed)
PT had a pacemaker placed this week and has a hx of a-fib. Today pt developed a dull HA and then is having an achy CP. Pt states it hurts on the upper part of his sternum and when he takes a deep breath a sharp pain radiates from there to the frontal area of the head. Pt reports some associated ShOB. Denies diaphoresis or nausea.

## 2019-12-15 NOTE — ED Notes (Signed)
Frozen meal, snacks & water given.

## 2019-12-15 NOTE — ED Notes (Signed)
Patient transported to CT 

## 2019-12-15 NOTE — ED Notes (Signed)
Date and time results received: 12/15/19 2154 (use smartphrase ".now" to insert current time)  Test: Troponin Critical Value: 129  Name of Provider Notified: Dr Rex Kras   Orders Received? Or Actions Taken?: none

## 2019-12-15 NOTE — Telephone Encounter (Signed)
error 

## 2019-12-15 NOTE — Telephone Encounter (Signed)
Pt states around 5:30 states he had some chest pains near the center of his chest. His arm is sore from getting his ppm. A few minutes ago he had some more chest pain. The pt is currently feeling light headed. The pt did feel some SOB and states is still a little SOB. I had the pt send in a transmission. Transmission received. I let him speak with device nurse Raquel Sarna, Rn.

## 2019-12-15 NOTE — ED Provider Notes (Signed)
[facility] EMERGENCY DEPARTMENT Provider Note   CSN: [id number] Arrival date & time: 12/15/19  1445     History Chief Complaint  Patient presents with  . Chest Pain  . Headache    Martin Rubio is a 74 y.o. male.  74yo M w/ PMH including A fib on Eliquis, HTN, HLD, OSA who presents with chest pain.  2 days ago, patient had pacemaker placed by Dr. [name].  Procedure went well and he has been recovering well at home with no problems at the surgical site.  This morning around 5:30 AM while in bed, he began having central, intermittent, sharp chest pains that came and went for approximately 30 minutes and then completely resolved.  He thought about coming to the hospital but decided not to.  This afternoon, he began having pain in his upper central chest near his sternal notch that radiates to the top of his head.  The pain is intermittent and seems to be worse when he takes a deep breath then, causing him to feel short of breath.  He denies any associated cough or fever.  No leg swelling or pain.  He notes that he has been off of Eliquis for approximately 5 days because of the procedure.  He plans to restart in 2 days. He became concerned that the pain was radiating to his head which is what prompted him to come in.   The history is provided by the patient.  Chest Pain Associated symptoms: headache   Headache      Past Medical History:  Diagnosis Date  . Bronchitis   . Diverticulosis   . FH: BPH (benign prostatic hypertrophy)   . History of Graves' disease 01/19/2018   S/p ablation  . HTN (hypertension)   . Hyperlipidemia   . Obesity   . Psoriatic arthritis ([place])   . Rheumatoid arthritis ([place]) 08/07/2014  . Sleep apnea    CPAP  . Thyroid disease     Patient Active Problem List   Diagnosis Date Noted  . PAF (paroxysmal atrial fibrillation) ([place]) 10/26/2019  . Dyslipidemia 09/14/2019  . Nodule of flexor tendon sheath 04/13/2019  . Hemorrhoids 06/24/2018  . Low  bone mass 01/19/2018  . Iliac aneurysm ([place]) 06/16/2017  . Hypertrophic cardiomyopathy ([place]) 06/16/2017  . Essential hypertension 12/16/2016  . Hyperlipidemia LDL goal <100 12/16/2016  . Asthma in adult, mild intermittent, uncomplicated 38/88/2800  . Collapsed vertebra, not elsewhere classified, cervical region, initial encounter for fracture ([place]) 10/08/2016  . Neuropathy 10/08/2016  . Contracture of ankle and foot joint 08/07/2014  . Dysphagia 08/07/2014  . Esophageal reflux 08/07/2014  . Male erectile dysfunction 08/07/2014  . Obstructive sleep apnea 08/07/2014  . Osteoarthritis 08/07/2014  . Rheumatoid arthritis ([place]) 08/07/2014  . Sensorineural hearing loss 08/07/2014  . Sleep disorder 08/07/2014  . Tinnitus 08/07/2014  . LVH (left ventricular hypertrophy) 12/25/2012  . Diverticulosis 09/01/2011  . White coat hypertension 07/07/2011  . Postablative hypothyroidism 06/09/2011  . Psoriatic arthritis ([place]) 06/09/2011  . Benign prostatic hyperplasia 06/09/2011    Past Surgical History:  Procedure Laterality Date  . INGUINAL HERNIA REPAIR     bilateral  . KNEE SURGERY     bilateral arthroscopic  . PACEMAKER IMPLANT N/A 12/13/2019   Procedure: PACEMAKER IMPLANT;  Surgeon: Evans Lance, MD;  Location: [facility];  Service: Cardiovascular;  Laterality: N/A;  . TRANSURETHRAL RESECTION OF PROSTATE    . UMBILICAL HERNIA REPAIR  Family History  Problem Relation Age of Onset  . CAD Father 68       Died age 29  . CAD Mother 44       CABG  . Diabetes Brother     Social History   Tobacco Use  . Smoking status: Former Smoker    Types: Cigarettes  . Smokeless tobacco: Never Used  . Tobacco comment: Quit 30 years ago.  Substance Use Topics  . Alcohol use: No  . Drug use: No    Home Medications Prior to Admission medications   Medication Sig Start Date End Date Taking? Authorizing Provider  alendronate (FOSAMAX) 70 MG tablet Take 1 tablet (70 mg total)  by mouth once a week. Take with a full glass of water on an empty stomach. Patient taking differently: Take 70 mg by mouth once a week. Take with a full glass of water on an empty stomach.  Wednesday 04/13/19   [name], DO  Alirocumab (PRALUENT) 150 MG/ML SOAJ Inject 150 mg into the skin every 14 (fourteen) days. 11/22/19   Minus Breeding, MD  apixaban (ELIQUIS) 5 MG TABS tablet Take 1 tablet (5 mg total) by mouth 2 (two) times daily. 12/17/19   Shirley Friar, PA-C  Cholecalciferol (VITAMIN D) 125 MCG (5000 UT) CAPS Take 5,000 Units by mouth daily.    [provider]  fluticasone (FLONASE) 50 MCG/ACT nasal spray Place 1 spray into both nostrils as needed for allergies.     [provider]  golimumab 2 mg/kg in sodium chloride 0.9 % Inject 2 mg/kg into the vein every 8 (eight) weeks.    [provider]  levothyroxine (SYNTHROID) 125 MCG tablet TAKE 1 TABLET (125 MCG TOTAL) BY MOUTH DAILY ON AN EMPTY STOMACH Patient taking differently: Take 125 mcg by mouth daily before breakfast.  09/07/19   [name], DO  losartan (COZAAR) 50 MG tablet Take 1 tablet (50 mg total) by mouth 2 (two) times daily. 09/23/18   Minus Breeding, MD  meclizine (ANTIVERT) 25 MG tablet Take 25 mg by mouth 3 (three) times daily as needed for dizziness.    [provider]  Melatonin 5 MG TABS Take 5 mg by mouth at bedtime.    [provider]  pantoprazole (PROTONIX) 40 MG tablet Take 40 mg by mouth daily. 09/18/19   [provider]  tamsulosin (FLOMAX) 0.4 MG CAPS capsule Take 0.4 mg by mouth at bedtime.  03/01/19   [provider]  triamcinolone cream (KENALOG) 0.1 % Apply 1 application topically as needed for itching. 08/22/19   [provider]  zinc gluconate 50 MG tablet Take 50 mg by mouth at bedtime.    [provider]    Allergies    Codeine, Nsaids, Prednisone, Statins, and Sulfa antibiotics  Review of Systems   Review  of Systems  Cardiovascular: Positive for chest pain.  Neurological: Positive for headaches.   All other systems reviewed and are negative except that which was mentioned in HPI  Physical Exam Updated Vital Signs BP (!) 173/101   Pulse 61   Temp 98.6 F (37 C) (Oral)   Resp 18   Ht 5\' 10"  (1.778 m)   Wt 99.8 kg   SpO2 98%   BMI 31.57 kg/m   Physical Exam Vitals and nursing note reviewed.  Constitutional:      General: He is not in acute distress.    Appearance: He is well-developed.  HENT:     Head:  Normocephalic and atraumatic.  Eyes:     Conjunctiva/sclera: Conjunctivae normal.  Cardiovascular:     Rate and Rhythm: Normal rate and regular rhythm.     Heart sounds: Murmur present.  Pulmonary:     Effort: Pulmonary effort is normal.     Breath sounds: Normal breath sounds.  Abdominal:     General: Bowel sounds are normal. There is no distension.     Palpations: Abdomen is soft.     Tenderness: There is no abdominal tenderness.  Musculoskeletal:     Cervical back: Neck supple.     Right lower leg: No edema.     Left lower leg: No edema.  Skin:    General: Skin is warm and dry.     Comments: Pacemaker L upper chest w/ steristrips in place, clean and dry; surrounding ecchymosis, no crepitus  Neurological:     Mental Status: He is alert and oriented to person, place, and time.     Comments: Fluent speech  Psychiatric:        Judgment: Judgment normal.     ED Results / Procedures / Treatments   Labs (all labs ordered are listed, but only abnormal results are displayed) Labs Reviewed  BASIC METABOLIC PANEL - Abnormal; Notable for the following components:      Result Value   Creatinine, Ser 1.46 (*)    GFR calc non Af Amer 46 (*)    GFR calc Af Amer 54 (*)    All other components within normal limits  CBC WITH DIFFERENTIAL/PLATELET - Abnormal; Notable for the following components:   RBC 5.88 (*)    MCV 76.5 (*)    MCH 22.8 (*)    MCHC 29.8 (*)    RDW 16.1  (*)    Monocytes Absolute 1.2 (*)    All other components within normal limits  D-DIMER, QUANTITATIVE (NOT AT [facility]) - Abnormal; Notable for the following components:   D-Dimer, Quant 1.54 (*)    All other components within normal limits  TROPONIN I (HIGH SENSITIVITY) - Abnormal; Notable for the following components:   Troponin I (High Sensitivity) 133 (*)    All other components within normal limits  TROPONIN I (HIGH SENSITIVITY) - Abnormal; Notable for the following components:   Troponin I (High Sensitivity) 116 (*)    All other components within normal limits  TROPONIN I (HIGH SENSITIVITY) - Abnormal; Notable for the following components:   Troponin I (High Sensitivity) 129 (*)    All other components within normal limits  SARS CORONAVIRUS 2 (TAT 6-24 HRS)    EKG EKG Interpretation  Date/Time:  Friday December 15 2019 14:52:58 EST Ventricular Rate:  72 PR Interval:    QRS Duration: 91 QT Interval:  415 QTC Calculation: 455 R Axis:   0 Text Interpretation: A-V dual-paced rhythm with some inhibition No further analysis attempted due to paced rhythm similar to previous Confirmed by Theotis Burrow [phone]) on 12/15/2019 3:19:46 PM   Radiology DG Chest 2 View  Result Date: 12/15/2019 CLINICAL DATA:  Central chest pain. EXAM: CHEST - 2 VIEW COMPARISON:  December 13, 2019 FINDINGS: The lateral view is limited in evaluation secondary to positioning of the patient's upper extremities. A dual lead AICD is noted. There is no evidence of acute infiltrate, pleural effusion or pneumothorax. The heart size and mediastinal contours are within normal limits. There is tortuosity of the descending thoracic aorta. Degenerative changes seen throughout the thoracic spine. IMPRESSION: No active cardiopulmonary disease. Electronically Signed  By: Virgina Norfolk M.D.   On: 12/15/2019 16:51   CT Angio Chest PE W/Cm &/Or Wo Cm  Result Date: 12/15/2019 CLINICAL DATA:  Elevated D-dimer with anterior chest  pain. EXAM: CT ANGIOGRAPHY CHEST WITH CONTRAST TECHNIQUE: Multidetector CT imaging of the chest was performed using the standard protocol during bolus administration of intravenous contrast. Multiplanar CT image reconstructions and MIPs were obtained to evaluate the vascular anatomy. CONTRAST:  70mL OMNIPAQUE IOHEXOL 350 MG/ML SOLN COMPARISON:  None. FINDINGS: Cardiovascular: There is moderate severity calcification of the thoracic aorta. Satisfactory opacification of the pulmonary arteries to the segmental level. No evidence of pulmonary embolism. Normal heart size. No pericardial effusion. Mediastinum/Nodes: No enlarged mediastinal, hilar, or axillary lymph nodes. Thyroid gland, trachea, and esophagus demonstrate no significant findings. Lungs/Pleura: Lungs are clear. No pleural effusion or pneumothorax. Upper Abdomen: Numerous gallstones are seen within the lumen of an otherwise normal-appearing gallbladder. Musculoskeletal: No chest wall abnormality. No acute or significant osseous findings. Review of the MIP images confirms the above findings. IMPRESSION: 1. Negative for pulmonary embolism or other acute intrathoracic process. 2. Moderate severity calcification of the thoracic aorta. 3. Cholelithiasis. 4. Aortic atherosclerosis. Aortic Atherosclerosis (ICD10-I70.0). Electronically Signed   By: Virgina Norfolk M.D.   On: 12/15/2019 18:49    Procedures Procedures (including critical care time) CRITICAL CARE Performed by: Wenda Overland Duaa Stelzner   Total critical care time: 30 minutes  Critical care time was exclusive of separately billable procedures and treating other patients.  Critical care was necessary to treat or prevent imminent or life-threatening deterioration.  Critical care was time spent personally by me on the following activities: development of treatment plan with patient and/or surrogate as well as nursing, discussions with consultants, evaluation of patient's response to treatment,  examination of patient, obtaining history from patient or surrogate, ordering and performing treatments and interventions, ordering and review of laboratory studies, ordering and review of radiographic studies, pulse oximetry and re-evaluation of patient's condition.  Medications Ordered in ED Medications  losartan (COZAAR) tablet 50 mg (has no administration in time range)  sodium chloride 0.9 % bolus 500 mL (0 mLs Intravenous Stopped 12/15/19 1833)  aspirin chewable tablet 324 mg (324 mg Oral Given 12/15/19 1735)  iohexol (OMNIPAQUE) 350 MG/ML injection 100 mL (80 mLs Intravenous Contrast Given 12/15/19 1819)    ED Course  I have reviewed the triage vital signs and the nursing notes.  Pertinent labs & imaging results that were available during my care of the patient were reviewed by me and considered in my medical decision making (see chart for details).    MDM Rules/Calculators/A&P                      Comfortable on exam, hypertensive, EKG shows paced rhythm. No problems at pacemaker/AICD site. CXR shows appropriate placement, no acute findings. Because of pleuritic pain, obtained d-dimer which was elevated. Obtained CTA which was negative for PE. Labs notable for stable Cr 1.5, initial trop 133; repeat 116. Discussed pt's case w/ cardiologist, Dr. [name].  He recommended repeating troponin again and if downtrending, patient could follow-up in clinic this week; alternatively, if uptrending he agreed with plan for observation admission.  Repeat troponin is 129.  Discussed again with overnight cardiologist, Dr. Marletta Lor.  Patient is chest pain-free therefore will hold off on heparin.  Discussed admission with [facility], Dr. Si Raider.  Final Clinical Impression(s) / ED Diagnoses Final diagnoses:  None    Rx / DC Orders ED Discharge  Orders    None       Levan Aloia, Wenda Overland, MD 12/15/19 [phone]

## 2019-12-15 NOTE — ED Notes (Signed)
Pt took home Tylenol for headache/arm pain, OK per MD.

## 2019-12-16 ENCOUNTER — Inpatient Hospital Stay (HOSPITAL_COMMUNITY): Payer: Medicare Other

## 2019-12-16 DIAGNOSIS — Z833 Family history of diabetes mellitus: Secondary | ICD-10-CM | POA: Diagnosis not present

## 2019-12-16 DIAGNOSIS — Z950 Presence of cardiac pacemaker: Secondary | ICD-10-CM

## 2019-12-16 DIAGNOSIS — N184 Chronic kidney disease, stage 4 (severe): Secondary | ICD-10-CM | POA: Diagnosis not present

## 2019-12-16 DIAGNOSIS — R079 Chest pain, unspecified: Secondary | ICD-10-CM

## 2019-12-16 DIAGNOSIS — Z7901 Long term (current) use of anticoagulants: Secondary | ICD-10-CM | POA: Diagnosis not present

## 2019-12-16 DIAGNOSIS — I480 Paroxysmal atrial fibrillation: Secondary | ICD-10-CM | POA: Diagnosis not present

## 2019-12-16 DIAGNOSIS — N1832 Chronic kidney disease, stage 3b: Secondary | ICD-10-CM

## 2019-12-16 DIAGNOSIS — E039 Hypothyroidism, unspecified: Secondary | ICD-10-CM | POA: Diagnosis not present

## 2019-12-16 DIAGNOSIS — I495 Sick sinus syndrome: Secondary | ICD-10-CM | POA: Diagnosis present

## 2019-12-16 DIAGNOSIS — Z20822 Contact with and (suspected) exposure to covid-19: Secondary | ICD-10-CM | POA: Diagnosis not present

## 2019-12-16 DIAGNOSIS — R42 Dizziness and giddiness: Secondary | ICD-10-CM | POA: Diagnosis not present

## 2019-12-16 DIAGNOSIS — I129 Hypertensive chronic kidney disease with stage 1 through stage 4 chronic kidney disease, or unspecified chronic kidney disease: Secondary | ICD-10-CM | POA: Diagnosis not present

## 2019-12-16 DIAGNOSIS — N400 Enlarged prostate without lower urinary tract symptoms: Secondary | ICD-10-CM | POA: Diagnosis present

## 2019-12-16 DIAGNOSIS — R0789 Other chest pain: Secondary | ICD-10-CM | POA: Diagnosis present

## 2019-12-16 DIAGNOSIS — R778 Other specified abnormalities of plasma proteins: Secondary | ICD-10-CM | POA: Diagnosis not present

## 2019-12-16 DIAGNOSIS — Z87891 Personal history of nicotine dependence: Secondary | ICD-10-CM | POA: Diagnosis not present

## 2019-12-16 DIAGNOSIS — R0781 Pleurodynia: Secondary | ICD-10-CM | POA: Diagnosis not present

## 2019-12-16 DIAGNOSIS — Z79890 Hormone replacement therapy (postmenopausal): Secondary | ICD-10-CM | POA: Diagnosis not present

## 2019-12-16 DIAGNOSIS — E785 Hyperlipidemia, unspecified: Secondary | ICD-10-CM | POA: Diagnosis not present

## 2019-12-16 DIAGNOSIS — G4733 Obstructive sleep apnea (adult) (pediatric): Secondary | ICD-10-CM | POA: Diagnosis not present

## 2019-12-16 DIAGNOSIS — R071 Chest pain on breathing: Secondary | ICD-10-CM | POA: Diagnosis not present

## 2019-12-16 DIAGNOSIS — Z7983 Long term (current) use of bisphosphonates: Secondary | ICD-10-CM | POA: Diagnosis not present

## 2019-12-16 DIAGNOSIS — Z8249 Family history of ischemic heart disease and other diseases of the circulatory system: Secondary | ICD-10-CM | POA: Diagnosis not present

## 2019-12-16 DIAGNOSIS — I441 Atrioventricular block, second degree: Secondary | ICD-10-CM | POA: Diagnosis not present

## 2019-12-16 DIAGNOSIS — Z66 Do not resuscitate: Secondary | ICD-10-CM | POA: Diagnosis not present

## 2019-12-16 LAB — ECHOCARDIOGRAM LIMITED
Height: 70 in
Weight: 3457.6 oz

## 2019-12-16 LAB — SARS CORONAVIRUS 2 (TAT 6-24 HRS): SARS Coronavirus 2: NEGATIVE

## 2019-12-16 LAB — TROPONIN I (HIGH SENSITIVITY): Troponin I (High Sensitivity): 140 ng/L (ref <18)

## 2019-12-16 MED ORDER — ENOXAPARIN SODIUM 40 MG/0.4ML ~~LOC~~ SOLN
40.0000 mg | SUBCUTANEOUS | Status: DC
  Administered 2019-12-16: 40 mg via SUBCUTANEOUS
  Filled 2019-12-16: qty 0.4

## 2019-12-16 MED ORDER — APIXABAN 5 MG PO TABS
5.0000 mg | ORAL_TABLET | Freq: Two times a day (BID) | ORAL | Status: DC
  Administered 2019-12-17: 5 mg via ORAL
  Filled 2019-12-16: qty 1

## 2019-12-16 MED ORDER — MELATONIN 3 MG PO TABS
4.5000 mg | ORAL_TABLET | Freq: Every day | ORAL | Status: DC
  Administered 2019-12-16: 4.5 mg via ORAL
  Filled 2019-12-16 (×2): qty 1.5

## 2019-12-16 MED ORDER — ACETAMINOPHEN 325 MG PO TABS: 650.0000 mg | ORAL_TABLET | ORAL | Status: DC | PRN

## 2019-12-16 MED ORDER — ONDANSETRON HCL 4 MG/2ML IJ SOLN: 4.0000 mg | Freq: Four times a day (QID) | INTRAMUSCULAR | Status: DC | PRN

## 2019-12-16 MED ORDER — LOSARTAN POTASSIUM 25 MG PO TABS
ORAL_TABLET | ORAL | Status: AC
  Filled 2019-12-16: qty 1

## 2019-12-16 MED ORDER — ACETAMINOPHEN 500 MG PO TABS
1000.0000 mg | ORAL_TABLET | Freq: Once | ORAL | Status: AC
  Administered 2019-12-16: 1000 mg via ORAL
  Filled 2019-12-16: qty 2

## 2019-12-16 NOTE — ED Notes (Signed)
Reports called to [facility] , and the primary nurse Marya Amsler, RN at [facility].

## 2019-12-16 NOTE — Progress Notes (Signed)
  Echocardiogram 2D Echocardiogram has been performed.  Martin Rubio 12/16/2019, 7:50 PM

## 2019-12-16 NOTE — ED Notes (Signed)
Pt attempting to get back in bed after voiding  Assisted pt back to bed  Pt c/o headache  Emptied urinals x 2 one for 634ml and the other 632ml of yellow urine  Sling on left arm intact  Monitor reading atrial fib with some paced beats noted  Denies chest pain at this time  Resp even and unlabored  Skin warm and dry

## 2019-12-16 NOTE — H&P (Signed)
History and Physical    [name] PNT:[id number] DOB: [birth year] DOA: 12/15/2019  PCP: [name], DO  Patient coming from: [facility]  I have personally briefly reviewed patient's old medical records in [place]  Chief Complaint: Chest pain  HPI: Martin Rubio is a 74 y.o. male with medical history significant of paroxysmal atrial fibrillation on Eliquis, sick sinus syndrome s/p pacemaker, hypertension, OSA on CPAP, CKD stage IV who presents with concerns of chest pain. Patient recently had pacemaker placement with Dr. [name] on 1/6. Earlier yesterday morning he noted midsternal chest pain that radiated to the right side.  Pain was sharp and stabbing and lasted for about 30 minutes.  Then later in the afternoon he again had midsternal chest pain that shot up up to the top of his head which also gave him a headache.  This time it was also associated with shortness of breath.  This episode lasted for about 15 to 20 minutes. He has been taking Tylenol BID for pain from his recent pacemaker placement. No aggravating or alleviating factors.  No nausea, vomiting, or abdominal pain.   D-dimer was elevated in the ED. CTA chest obtained showed no pulmonary embolism. EKG showed paced rhythm.  Case was discussed with ED physician with cardiologist Dr. [name] and initial plan was to follow troponin and see outpatient if there was a downtrend. Initial troponin of 133, 116, 129 so pt was asked to be admitted.   On arrival, patient was asymptomatic and chest pain free.   Review of Systems:  Constitutional: No Weight Change, No Fever ENT/Mouth: No sore throat, No Rhinorrhea Eyes: No Vision Changes Cardiovascular: + Chest Pain, + SOB Respiratory: No Cough, No Sputum Gastrointestinal: No Nausea, No Vomiting, No Diarrhea, No Pain Genitourinary: no Urinary Incontinence Musculoskeletal: No Arthralgias, No Myalgias Skin: No Skin Lesions, No Pruritus, Neuro: no Weakness, No Numbness Psych:  no decrease appetite Heme/Lymph: No Bruising, No Bleeding  Past Medical History:  Diagnosis Date  . Bronchitis   . Diverticulosis   . FH: BPH (benign prostatic hypertrophy)   . History of Graves' disease 01/19/2018   S/p ablation  . HTN (hypertension)   . Hyperlipidemia   . Obesity   . Psoriatic arthritis ([place])   . Rheumatoid arthritis ([place]) 08/07/2014  . Sleep apnea    CPAP  . Thyroid disease     Past Surgical History:  Procedure Laterality Date  . INGUINAL HERNIA REPAIR     bilateral  . KNEE SURGERY     bilateral arthroscopic  . PACEMAKER IMPLANT N/A 12/13/2019   Procedure: PACEMAKER IMPLANT;  Surgeon: Evans Lance, MD;  Location: [place] [facility];  Service: Cardiovascular;  Laterality: N/A;  . TRANSURETHRAL RESECTION OF PROSTATE    . UMBILICAL HERNIA REPAIR       reports that he has quit smoking. His smoking use included cigarettes. He has never used smokeless tobacco. He reports that he does not drink alcohol or use drugs.  Allergies  Allergen Reactions  . Codeine     Tension, nasty feeling   . Nsaids Other (See Comments)    Renal insufficiency  . Prednisone Other (See Comments)    Unknown/ some times takes  . Statins Other (See Comments)    Joint pain   . Sulfa Antibiotics Other (See Comments)    Joint pain    Family History  Problem Relation Age of Onset  . CAD Father 1       Died age 34  .  CAD Mother 9       CABG  . Diabetes Brother      Prior to Admission medications   Medication Sig Start Date End Date Taking? Authorizing Provider  alendronate (FOSAMAX) 70 MG tablet Take 1 tablet (70 mg total) by mouth once a week. Take with a full glass of water on an empty stomach. Patient taking differently: Take 70 mg by mouth once a week. Take with a full glass of water on an empty stomach.  Wednesday 04/13/19   [name], DO  Alirocumab (PRALUENT) 150 MG/ML SOAJ Inject 150 mg into the skin every 14 (fourteen) days. 11/22/19   Minus Breeding, MD    apixaban (ELIQUIS) 5 MG TABS tablet Take 1 tablet (5 mg total) by mouth 2 (two) times daily. 12/17/19   Shirley Friar, PA-C  Cholecalciferol (VITAMIN D) 125 MCG (5000 UT) CAPS Take 5,000 Units by mouth daily.    [provider]  fluticasone (FLONASE) 50 MCG/ACT nasal spray Place 1 spray into both nostrils as needed for allergies.     [provider]  golimumab 2 mg/kg in sodium chloride 0.9 % Inject 2 mg/kg into the vein every 8 (eight) weeks.    [provider]  levothyroxine (SYNTHROID) 125 MCG tablet TAKE 1 TABLET (125 MCG TOTAL) BY MOUTH DAILY ON AN EMPTY STOMACH Patient taking differently: Take 125 mcg by mouth daily before breakfast.  09/07/19   [name], DO  losartan (COZAAR) 50 MG tablet Take 1 tablet (50 mg total) by mouth 2 (two) times daily. 09/23/18   Minus Breeding, MD  meclizine (ANTIVERT) 25 MG tablet Take 25 mg by mouth 3 (three) times daily as needed for dizziness.    [provider]  Melatonin 5 MG TABS Take 5 mg by mouth at bedtime.    [provider]  pantoprazole (PROTONIX) 40 MG tablet Take 40 mg by mouth daily. 09/18/19   [provider]  tamsulosin (FLOMAX) 0.4 MG CAPS capsule Take 0.4 mg by mouth at bedtime.  03/01/19   [provider]  triamcinolone cream (KENALOG) 0.1 % Apply 1 application topically as needed for itching. 08/22/19   [provider]  zinc gluconate 50 MG tablet Take 50 mg by mouth at bedtime.    [provider]    Physical Exam: Vitals:   12/16/19 1000 12/16/19 1030 12/16/19 1300 12/16/19 1453  BP: (!) 150/88 (!) 164/86 (!) 138/94 (!) 132/56  Pulse: 62 (!) 59 66 72  Resp: 18 18 16    Temp:    98 F (36.7 C)  TempSrc:    Oral  SpO2: 95% 98% 97% 96%  Weight:    98 kg  Height:    5\' 10"  (1.778 m)    Constitutional: NAD, calm, comfortable, sitting up on side of bed Vitals:   12/16/19 1000 12/16/19 1030 12/16/19 1300 12/16/19 1453  BP: (!) 150/88 (!)  164/86 (!) 138/94 (!) 132/56  Pulse: 62 (!) 59 66 72  Resp: 18 18 16    Temp:    98 F (36.7 C)  TempSrc:    Oral  SpO2: 95% 98% 97% 96%  Weight:    98 kg  Height:    5\' 10"  (1.778 m)   Eyes: PERRL, lids and conjunctivae normal ENMT: Mucous membranes are moist.  Neck: normal, supple Respiratory: clear to auscultation bilaterally, no wheezing, no crackles. Normal respiratory effort on room air. No accessory muscle use.  Cardiovascular: Regular rate and rhythm, no murmurs /  rubs / gallops. No extremity edema. Pacemaker placement seen on left anterior chest with clean overlaying steri-strip  Abdomen: no tenderness, no masses palpated.  Bowel sounds positive.  Musculoskeletal: no clubbing / cyanosis. No joint deformity upper and lower extremities. Good ROM, no contractures. Normal muscle tone. Left arm in sling due to recent pacemaker placement. Skin: no rashes, lesions, ulcers. No induration Neurologic: CN 2-12 grossly intact. Sensation intact. Strength 5/5 in all 4.  Psychiatric: Normal judgment and insight. Alert and oriented x 3. Normal mood.     Labs on Admission: I have personally reviewed following labs and imaging studies  CBC: Recent Labs  Lab 12/15/19 1614  WBC 9.4  NEUTROABS 5.7  HGB 13.4  HCT 45.0  MCV 76.5*  PLT 353   Basic Metabolic Panel: Recent Labs  Lab 12/15/19 1614  NA 140  K 4.1  CL 102  CO2 28  GLUCOSE 71  BUN 20  CREATININE 1.46*  CALCIUM 9.5   GFR: Estimated Creatinine Clearance: 51.3 mL/min (A) (by C-G formula based on SCr of 1.46 mg/dL (H)). Liver Function Tests: No results for input(s): AST, ALT, ALKPHOS, BILITOT, PROT, ALBUMIN in the last 168 hours. No results for input(s): LIPASE, AMYLASE in the last 168 hours. No results for input(s): AMMONIA in the last 168 hours. Coagulation Profile: No results for input(s): INR, PROTIME in the last 168 hours. Cardiac Enzymes: No results for input(s): CKTOTAL, CKMB, CKMBINDEX, TROPONINI in the last  168 hours. BNP (last 3 results) No results for input(s): PROBNP in the last 8760 hours. HbA1C: No results for input(s): HGBA1C in the last 72 hours. CBG: No results for input(s): GLUCAP in the last 168 hours. Lipid Profile: No results for input(s): CHOL, HDL, LDLCALC, TRIG, CHOLHDL, LDLDIRECT in the last 72 hours. Thyroid Function Tests: No results for input(s): TSH, T4TOTAL, FREET4, T3FREE, THYROIDAB in the last 72 hours. Anemia Panel: No results for input(s): VITAMINB12, FOLATE, FERRITIN, TIBC, IRON, RETICCTPCT in the last 72 hours. Urine analysis: No results found for: COLORURINE, APPEARANCEUR, LABSPEC, [place], GLUCOSEU, HGBUR, BILIRUBINUR, KETONESUR, PROTEINUR, UROBILINOGEN, NITRITE, LEUKOCYTESUR  Radiological Exams on Admission: DG Chest 2 View  Result Date: 12/15/2019 CLINICAL DATA:  Central chest pain. EXAM: CHEST - 2 VIEW COMPARISON:  December 13, 2019 FINDINGS: The lateral view is limited in evaluation secondary to positioning of the patient's upper extremities. A dual lead AICD is noted. There is no evidence of acute infiltrate, pleural effusion or pneumothorax. The heart size and mediastinal contours are within normal limits. There is tortuosity of the descending thoracic aorta. Degenerative changes seen throughout the thoracic spine. IMPRESSION: No active cardiopulmonary disease. Electronically Signed   By: Virgina Norfolk M.D.   On: 12/15/2019 16:51   CT Angio Chest PE W/Cm &/Or Wo Cm  Result Date: 12/15/2019 CLINICAL DATA:  Elevated D-dimer with anterior chest pain. EXAM: CT ANGIOGRAPHY CHEST WITH CONTRAST TECHNIQUE: Multidetector CT imaging of the chest was performed using the standard protocol during bolus administration of intravenous contrast. Multiplanar CT image reconstructions and MIPs were obtained to evaluate the vascular anatomy. CONTRAST:  67mL OMNIPAQUE IOHEXOL 350 MG/ML SOLN COMPARISON:  None. FINDINGS: Cardiovascular: There is moderate severity calcification of the  thoracic aorta. Satisfactory opacification of the pulmonary arteries to the segmental level. No evidence of pulmonary embolism. Normal heart size. No pericardial effusion. Mediastinum/Nodes: No enlarged mediastinal, hilar, or axillary lymph nodes. Thyroid gland, trachea, and esophagus demonstrate no significant findings. Lungs/Pleura: Lungs are clear. No pleural effusion or pneumothorax. Upper Abdomen: Numerous gallstones  are seen within the lumen of an otherwise normal-appearing gallbladder. Musculoskeletal: No chest wall abnormality. No acute or significant osseous findings. Review of the MIP images confirms the above findings. IMPRESSION: 1. Negative for pulmonary embolism or other acute intrathoracic process. 2. Moderate severity calcification of the thoracic aorta. 3. Cholelithiasis. 4. Aortic atherosclerosis. Aortic Atherosclerosis (ICD10-I70.0). Electronically Signed   By: Virgina Norfolk M.D.   On: 12/15/2019 18:49   ECHOCARDIOGRAM LIMITED  Result Date: 12/16/2019   ECHOCARDIOGRAM LIMITED REPORT   Patient Name:   Martin Rubio Date of Exam: 12/16/2019 Medical Rec #:  [id number]       Height:       70.0 in Accession #:    [id number]      Weight:       216.1 lb Date of Birth:  1946/08/01      BSA:          2.16 m Patient Age:    1 years        BP:           132/56 mmHg Patient Gender: M               HR:           60 bpm. Exam Location:  Inpatient  Procedure: Limited Echo, Cardiac Doppler and Color Doppler STAT ECHO Indications:    chest pain 786.50  History:        Patient has prior history of Echocardiogram examinations, most                 recent 09/25/2019.  Sonographer:    Johny Chess Referring Phys: [id number] [place] T Miko Sirico IMPRESSIONS  1. Left ventricular ejection fraction, by visual estimation, is 55 to 60%. The left ventricle has normal function. There is no increased left ventricular wall thickness.  2. Left ventricular diastolic parameters are consistent with Grade I diastolic dysfunction  (impaired relaxation).  3. The left ventricle has no regional wall motion abnormalities.  4. Global right ventricle has normal systolc function.The right ventricular size is normal. no increase in right ventricular wall thickness.  5. The mitral valve is normal in structure. No evidence of mitral valve regurgitation. No evidence of mitral stenosis.  6. The tricuspid valve was normal in structure. Tricuspid valve regurgitation is not demonstrated.  7. Tricuspid valve regurgitation is not demonstrated.  8. Mild aortic valve sclerosis without stenosis.  9. The pulmonic valve was normal in structure. 10. The inferior vena cava is normal in size with greater than 50% respiratory variability, suggesting right atrial pressure of 3 mmHg. FINDINGS  Left Ventricle: Left ventricular ejection fraction, by visual estimation, is 55 to 60%. The left ventricle has normal function. The left ventricle has no regional wall motion abnormalities. There is no increased left ventricular wall thickness. Left ventricular diastolic parameters are consistent with Grade I diastolic dysfunction (impaired relaxation). Normal left atrial pressure. Right Ventricle: The right ventricular size is normal. No increase in right ventricular wall thickness. Global RV systolic function is has normal systolic function. Left Atrium: Left atrial size was normal in size. Right Atrium: Right atrial size was normal in size. Right atrial pressure is estimated at 5 mmHg. Pericardium: There is no evidence of pericardial effusion is seen. There is no evidence of pericardial effusion. Mitral Valve: The mitral valve is normal in structure. No evidence of mitral valve stenosis by observation. MV Area by PHT, 2.26 cm. MV PHT, 97.15 msec. No evidence of mitral valve regurgitation. Tricuspid  Valve: The tricuspid valve is normal in structure. Tricuspid valve regurgitation is not demonstrated. Aortic Valve: The aortic valve is tricuspid. Aortic valve regurgitation is not  visualized. Mild aortic valve sclerosis is present, with no evidence of aortic valve stenosis. Pulmonic Valve: The pulmonic valve was normal in structure. Pulmonic valve regurgitation is not visualized by color flow Doppler. Pulmonic regurgitation is not visualized by color flow Doppler. Aorta: The aortic root, ascending aorta and aortic arch are all structurally normal, with no evidence of dilitation or obstruction. Venous: The inferior vena cava is normal in size with greater than 50% respiratory variability, suggesting right atrial pressure of 3 mmHg. Shunts: There is no evidence of a patent foramen ovale. No ventricular septal defect is seen or detected. There is no evidence of an atrial septal defect. No atrial level shunt detected by color flow Doppler.  LEFT VENTRICLE         Normals PLAX 2D LVIDd:         3.10 cm 3.6 cm   Diastology                 Normals LVIDs:         2.30 cm 1.7 cm   LV e' lateral:   7.94 cm/s 6.42 cm/s LV SV:         20 ml   79 ml    LV E/e' lateral: 7.8       15.4 LV SV Index:   8.89    45 ml/m2 LV e' medial:    6.20 cm/s 6.96 cm/s                                 LV E/e' medial:  10.0      6.96  LEFT ATRIUM         Index LA diam:    3.20 cm 1.48 cm/m  AORTIC VALVE              Normals LVOT Vmax:   174.00 cm/s LVOT Vmean:  126.000 cm/s 75 cm/s LVOT VTI:    0.353 m      25.3 cm  AORTA                 Normals Ao Root diam: 2.70 cm 31 mm MITRAL VALVE              Normals MV Area (PHT): 2.26 cm             SHUNTS MV PHT:        97.15 msec 55 ms     Systemic VTI: 0.35 m MV Decel Time: 335 msec   187 ms MV E velocity: 61.70 cm/s 103 cm/s MV A velocity: 83.10 cm/s 70.3 cm/s MV E/A ratio:  0.74       1.5  Fransico Him MD Electronically signed by Fransico Him MD Signature Date/Time: 12/16/2019/7:59:20 PMThe mitral valve is normal in structure.    Final     EKG: Independently reviewed.   Assessment/Plan  Chest pain following recent pacemaker placement Pacemaker placed on 12/13/19 with Dr.  [name]  Will continue to trend troponins  I spoke with cardiologist Dr. Radford Pax who recommended a stat Echo to evaluate for pericardial effusion   Paroxysmal atrial fibrillation  will resume Eliquis on 1/10 per Dr. [name]   Sick sinus syndrome s/p pacemaker stable. should not have wound care until 1/12.   CKD stage IV Stable  avoid nephrotoxic agent  Hypertension continue losartan   BPH  continue flomax  Hypothyroidism continue levothyroxine  OSA Continue home CPAP  DVT prophylaxis:SCD- start Eliquis tomorrow Code Status:DNR Family Communication: Plan discussed with patient and wife at bedside  disposition Plan: Home with at least 2 midnight stays  Consults called: cardiology Admission status: inpatient   Brenee Gajda T Diyan Dave DO [facility]   If 7PM-7AM, please contact night-coverage [email or web address] Password [facility]  12/16/2019, 8:09 PM

## 2019-12-16 NOTE — Plan of Care (Signed)
  Problem: Health Behavior/Discharge Planning: Goal: Ability to manage health-related needs will improve Outcome: Progressing   

## 2019-12-17 DIAGNOSIS — R071 Chest pain on breathing: Secondary | ICD-10-CM

## 2019-12-17 LAB — TROPONIN I (HIGH SENSITIVITY): Troponin I (High Sensitivity): 62 ng/L — ABNORMAL HIGH (ref <18)

## 2019-12-17 NOTE — Plan of Care (Signed)

## 2019-12-17 NOTE — Discharge Instructions (Signed)

## 2019-12-17 NOTE — Plan of Care (Signed)
  Problem: Education: Goal: Knowledge of General Education information will improve Description: Including pain rating scale, medication(s)/side effects and non-pharmacologic comfort measures Outcome: Progressing   Problem: Health Behavior/Discharge Planning: Goal: Ability to manage health-related needs will improve Outcome: Progressing   Problem: Clinical Measurements: Goal: Diagnostic test results will improve Outcome: Progressing   Problem: Coping: Goal: Level of anxiety will decrease Outcome: Progressing   Problem: Pain Managment: Goal: General experience of comfort will improve Outcome: Progressing   Problem: Safety: Goal: Ability to remain free from injury will improve Outcome: Progressing  Elesa Hacker, RN

## 2019-12-17 NOTE — Discharge Summary (Signed)
Physician Discharge Summary  Martin Rubio WCH:[id number] DOB: 1946-08-05 DOA: 12/15/2019  PCP: [name], DO  Admit date: 12/15/2019 Discharge date: 12/17/2019  Time spent: 45 minutes  Recommendations for Outpatient Follow-up:  Patient will be discharged to home.  Patient will need to follow up with primary care provider within one week of discharge. Follow up with cardiology.  Patient should continue medications as prescribed.  Patient should follow a heart healthy diet.   Discharge Diagnoses:  Chest pain with elevated troponins Paroxysmal atrial fibrillation Sick sinus syndrome Chronic kidney disease, stage IV Essential Hypertension BPH Hypothyroidism OSA  Discharge Condition: Stable  Diet recommendation: heart healthy  Filed Weights   12/15/19 1456 12/16/19 1453 12/17/19 0540  Weight: 99.8 kg 98 kg 97.5 kg    History of present illness:  On 12/16/2019 by Dr. [name] is a 74 y.o. male with medical history significant of paroxysmal atrial fibrillation on Eliquis, sick sinus syndrome s/p pacemaker, hypertension, OSA on CPAP, CKD stage IV who presents with concerns of chest pain. Patient recently had pacemaker placement with Dr. [name] on 1/6. Earlier yesterday morning he noted midsternal chest pain that radiated to the right side.  Pain was sharp and stabbing and lasted for about 30 minutes.  Then later in the afternoon he again had midsternal chest pain that shot up up to the top of his head which also gave him a headache.  This time it was also associated with shortness of breath.  This episode lasted for about 15 to 20 minutes. He has been taking Tylenol BID for pain from his recent pacemaker placement. No aggravating or alleviating factors.  No nausea, vomiting, or abdominal pain.   D-dimer was elevated in the ED. CTA chest obtained showed no pulmonary embolism. EKG showed paced rhythm.  Case was discussed with ED physician with cardiologist Dr. [name]  and initial plan was to follow troponin and see outpatient if there was a downtrend. Initial troponin of 133, 116, 129 so pt was asked to be admitted.   On arrival, patient was asymptomatic and chest pain free.  Hospital Course:  Chest pain with elevated troponins -Chest pain has resolved. -Suspect troponins are elevated secondary to recent pacemaker placement, however trending downward -With some mild musculoskeletal involvement -Cardiology consulted and appreciated. Discussed with cardiology, Dr. [name], pacemaker was interrogated and appears to be functioning normally.  Paroxysmal atrial fibrillation -restart Eliquis today  Sick sinus syndrome -Has pacemaker placement  Chronic kidney disease, stage IV -Stable  Essential Hypertension -Continue losartan   BPH -Continue Flomax  Hypothyroidism -Continue Synthroid  OSA -Continue CPAP  Code status: DNR  Procedures: Echocardiogram  Consultations: Cardiology  Discharge Exam: Vitals:   12/16/19 2112 12/17/19 0540  BP: (!) 162/76 (!) 152/91  Pulse: (!) 59 60  Resp: 16 16  Temp: 98.4 F (36.9 C) 98 F (36.7 C)  SpO2: 95% 97%     General: Well developed, well nourished, NAD, appears stated age  HEENT: NCAT, mucous membranes moist.  Cardiovascular: S1 S2 auscultated, RRR, no murmur.   Respiratory: Clear to auscultation bilaterally   Abdomen: Soft, nontender, nondistended, + bowel sounds  Extremities: warm dry without cyanosis clubbing or edema. LUE in sling  Neuro: AAOx3, nonfocal  Psych: Pleasant, appropriate mood and affect  Discharge Instructions Discharge Instructions    Discharge instructions   Complete by: As directed    Patient will be discharged to home.  Patient will need to follow up with primary care provider within  one week of discharge. Follow up with cardiology.  Patient should continue medications as prescribed.  Patient should follow a heart healthy diet.     Allergies as of 12/17/2019       Reactions   Codeine    Tension, nasty feeling   Nsaids Other (See Comments)   Renal insufficiency   Prednisone Other (See Comments)   Unknown/ sometimes takes with lower dose   Statins Other (See Comments)   Joint pain   Sulfa Antibiotics Other (See Comments)   Joint pain      Medication List    TAKE these medications   acetaminophen 325 MG tablet Commonly known as: TYLENOL Take 650 mg by mouth every 6 (six) hours as needed for mild pain.   alendronate 70 MG tablet Commonly known as: FOSAMAX Take 1 tablet (70 mg total) by mouth once a week. Take with a full glass of water on an empty stomach. What changed: additional instructions   apixaban 5 MG Tabs tablet Commonly known as: Eliquis Take 1 tablet (5 mg total) by mouth 2 (two) times daily.   fluticasone 50 MCG/ACT nasal spray Commonly known as: FLONASE Place 1 spray into both nostrils as needed for allergies.   golimumab 2 mg/kg in sodium chloride 0.9 % Inject 2 mg/kg into the vein every 8 (eight) weeks.   levothyroxine 125 MCG tablet Commonly known as: SYNTHROID TAKE 1 TABLET (125 MCG TOTAL) BY MOUTH DAILY ON AN EMPTY STOMACH What changed: See the new instructions.   losartan 50 MG tablet Commonly known as: COZAAR Take 1 tablet (50 mg total) by mouth 2 (two) times daily.   meclizine 25 MG tablet Commonly known as: ANTIVERT Take 25 mg by mouth 3 (three) times daily as needed for dizziness.   Melatonin 5 MG Tabs Take 5 mg by mouth at bedtime.   pantoprazole 40 MG tablet Commonly known as: PROTONIX Take 40 mg by mouth daily.   Praluent 150 MG/ML Soaj Generic drug: Alirocumab Inject 150 mg into the skin every 14 (fourteen) days.   sildenafil 20 MG tablet Commonly known as: REVATIO Take 60-100 mg by mouth daily as needed for erectile dysfunction.   tamsulosin 0.4 MG Caps capsule Commonly known as: FLOMAX Take 0.4 mg by mouth at bedtime.   triamcinolone cream 0.1 % Commonly known as:  KENALOG Apply 1 application topically as needed for itching.   Vitamin D 125 MCG (5000 UT) Caps Take 5,000 Units by mouth daily.   zinc gluconate 50 MG tablet Take 50 mg by mouth at bedtime.      Allergies  Allergen Reactions   Codeine     Tension, nasty feeling    Nsaids Other (See Comments)    Renal insufficiency   Prednisone Other (See Comments)    Unknown/ sometimes takes with lower dose   Statins Other (See Comments)    Joint pain    Sulfa Antibiotics Other (See Comments)    Joint pain   Follow-up Information    [name], DO. Schedule an appointment as soon as possible for a visit in 1 week(s).   Specialty: Family Medicine Why: Hospital follow up Contact information: [place] [address] [phone]        Minus Breeding, MD .   Specialty: Cardiology Contact information: [address] [place] [address] [phone]            The results of significant diagnostics from this hospitalization (including imaging, microbiology, ancillary and  laboratory) are listed below for reference.    Significant Diagnostic Studies: DG Chest 2 View  Result Date: 12/15/2019 CLINICAL DATA:  Central chest pain. EXAM: CHEST - 2 VIEW COMPARISON:  December 13, 2019 FINDINGS: The lateral view is limited in evaluation secondary to positioning of the patient's upper extremities. A dual lead AICD is noted. There is no evidence of acute infiltrate, pleural effusion or pneumothorax. The heart size and mediastinal contours are within normal limits. There is tortuosity of the descending thoracic aorta. Degenerative changes seen throughout the thoracic spine. IMPRESSION: No active cardiopulmonary disease. Electronically Signed   By: Virgina Norfolk M.D.   On: 12/15/2019 16:51   DG Chest 2 View  Result Date: 12/13/2019 CLINICAL DATA:  Cardiac device in place. EXAM: CHEST - 2 VIEW COMPARISON:  October 30, 2015 FINDINGS: A dual lead  AICD is noted with adequate lead positioning. This represents a new finding when compared to the prior study. There is no evidence of acute infiltrate, pleural effusion or pneumothorax. The heart size and mediastinal contours are within normal limits. Multilevel degenerative changes seen throughout the thoracic spine. IMPRESSION: 1. Interval dual lead AICD placement and positioning, as described above, when compared to the prior study dated October 30, 2015. 2. No active cardiopulmonary disease. Electronically Signed   By: Virgina Norfolk M.D.   On: 12/13/2019 17:37   CT Angio Chest PE W/Cm &/Or Wo Cm  Result Date: 12/15/2019 CLINICAL DATA:  Elevated D-dimer with anterior chest pain. EXAM: CT ANGIOGRAPHY CHEST WITH CONTRAST TECHNIQUE: Multidetector CT imaging of the chest was performed using the standard protocol during bolus administration of intravenous contrast. Multiplanar CT image reconstructions and MIPs were obtained to evaluate the vascular anatomy. CONTRAST:  77mL OMNIPAQUE IOHEXOL 350 MG/ML SOLN COMPARISON:  None. FINDINGS: Cardiovascular: There is moderate severity calcification of the thoracic aorta. Satisfactory opacification of the pulmonary arteries to the segmental level. No evidence of pulmonary embolism. Normal heart size. No pericardial effusion. Mediastinum/Nodes: No enlarged mediastinal, hilar, or axillary lymph nodes. Thyroid gland, trachea, and esophagus demonstrate no significant findings. Lungs/Pleura: Lungs are clear. No pleural effusion or pneumothorax. Upper Abdomen: Numerous gallstones are seen within the lumen of an otherwise normal-appearing gallbladder. Musculoskeletal: No chest wall abnormality. No acute or significant osseous findings. Review of the MIP images confirms the above findings. IMPRESSION: 1. Negative for pulmonary embolism or other acute intrathoracic process. 2. Moderate severity calcification of the thoracic aorta. 3. Cholelithiasis. 4. Aortic atherosclerosis.  Aortic Atherosclerosis (ICD10-I70.0). Electronically Signed   By: Virgina Norfolk M.D.   On: 12/15/2019 18:49   EP PPM/ICD IMPLANT  Result Date: 12/13/2019 CONCLUSIONS:  1. Successful implantation of a medtronic dual-chamber pacemaker for symptomatic bradycardia due to sinus node dysfunction and mobitz 2, second degree AV block  2. No early apparent complications.       Cristopher Peru, MD 12/13/2019 12:29 PM   ECHOCARDIOGRAM LIMITED  Result Date: 12/16/2019   ECHOCARDIOGRAM LIMITED REPORT   Patient Name:   Martin Rubio Date of Exam: 12/16/2019 Medical Rec #:  [id number]       Height:       70.0 in Accession #:    [id number]      Weight:       216.1 lb Date of Birth:  06-Sep-1946      BSA:          2.16 m Patient Age:    74 years        BP:  132/56 mmHg Patient Gender: M               HR:           60 bpm. Exam Location:  Inpatient  Procedure: Limited Echo, Cardiac Doppler and Color Doppler STAT ECHO Indications:    chest pain 786.50  History:        Patient has prior history of Echocardiogram examinations, most                 recent 09/25/2019.  Sonographer:    Johny Chess Referring Phys: [id number] [place] T TU IMPRESSIONS  1. Left ventricular ejection fraction, by visual estimation, is 55 to 60%. The left ventricle has normal function. There is no increased left ventricular wall thickness.  2. Left ventricular diastolic parameters are consistent with Grade I diastolic dysfunction (impaired relaxation).  3. The left ventricle has no regional wall motion abnormalities.  4. Global right ventricle has normal systolc function.The right ventricular size is normal. no increase in right ventricular wall thickness.  5. The mitral valve is normal in structure. No evidence of mitral valve regurgitation. No evidence of mitral stenosis.  6. The tricuspid valve was normal in structure. Tricuspid valve regurgitation is not demonstrated.  7. Tricuspid valve regurgitation is not demonstrated.  8. Mild aortic valve  sclerosis without stenosis.  9. The pulmonic valve was normal in structure. 10. The inferior vena cava is normal in size with greater than 50% respiratory variability, suggesting right atrial pressure of 3 mmHg. FINDINGS  Left Ventricle: Left ventricular ejection fraction, by visual estimation, is 55 to 60%. The left ventricle has normal function. The left ventricle has no regional wall motion abnormalities. There is no increased left ventricular wall thickness. Left ventricular diastolic parameters are consistent with Grade I diastolic dysfunction (impaired relaxation). Normal left atrial pressure. Right Ventricle: The right ventricular size is normal. No increase in right ventricular wall thickness. Global RV systolic function is has normal systolic function. Left Atrium: Left atrial size was normal in size. Right Atrium: Right atrial size was normal in size. Right atrial pressure is estimated at 5 mmHg. Pericardium: There is no evidence of pericardial effusion is seen. There is no evidence of pericardial effusion. Mitral Valve: The mitral valve is normal in structure. No evidence of mitral valve stenosis by observation. MV Area by PHT, 2.26 cm. MV PHT, 97.15 msec. No evidence of mitral valve regurgitation. Tricuspid Valve: The tricuspid valve is normal in structure. Tricuspid valve regurgitation is not demonstrated. Aortic Valve: The aortic valve is tricuspid. Aortic valve regurgitation is not visualized. Mild aortic valve sclerosis is present, with no evidence of aortic valve stenosis. Pulmonic Valve: The pulmonic valve was normal in structure. Pulmonic valve regurgitation is not visualized by color flow Doppler. Pulmonic regurgitation is not visualized by color flow Doppler. Aorta: The aortic root, ascending aorta and aortic arch are all structurally normal, with no evidence of dilitation or obstruction. Venous: The inferior vena cava is normal in size with greater than 50% respiratory variability, suggesting  right atrial pressure of 3 mmHg. Shunts: There is no evidence of a patent foramen ovale. No ventricular septal defect is seen or detected. There is no evidence of an atrial septal defect. No atrial level shunt detected by color flow Doppler.  LEFT VENTRICLE         Normals PLAX 2D LVIDd:         3.10 cm 3.6 cm   Diastology  Normals LVIDs:         2.30 cm 1.7 cm   LV e' lateral:   7.94 cm/s 6.42 cm/s LV SV:         20 ml   79 ml    LV E/e' lateral: 7.8       15.4 LV SV Index:   8.89    45 ml/m2 LV e' medial:    6.20 cm/s 6.96 cm/s                                 LV E/e' medial:  10.0      6.96  LEFT ATRIUM         Index LA diam:    3.20 cm 1.48 cm/m  AORTIC VALVE              Normals LVOT Vmax:   174.00 cm/s LVOT Vmean:  126.000 cm/s 75 cm/s LVOT VTI:    0.353 m      25.3 cm  AORTA                 Normals Ao Root diam: 2.70 cm 31 mm MITRAL VALVE              Normals MV Area (PHT): 2.26 cm             SHUNTS MV PHT:        97.15 msec 55 ms     Systemic VTI: 0.35 m MV Decel Time: 335 msec   187 ms MV E velocity: 61.70 cm/s 103 cm/s MV A velocity: 83.10 cm/s 70.3 cm/s MV E/A ratio:  0.74       1.5  Fransico Him MD Electronically signed by Fransico Him MD Signature Date/Time: 12/16/2019/7:59:20 PMThe mitral valve is normal in structure.    Final     Microbiology: Recent Results (from the past 240 hour(s))  Novel Coronavirus, NAA (Hosp order, Send-out to Ref Lab; TAT 18-24 hrs     Status: None   Collection Time: 12/09/19 12:31 PM   Specimen: Nasopharyngeal Swab; Respiratory  Result Value Ref Range Status   SARS-CoV-2, NAA NOT DETECTED NOT DETECTED Final    Comment: (NOTE) This nucleic acid amplification test was developed and its performance characteristics determined by [employer]. Nucleic acid amplification tests include PCR and TMA. This test has not been FDA cleared or approved. This test has been authorized by FDA under an Emergency Use Authorization (EUA). This test is  only authorized for the duration of time the declaration that circumstances exist justifying the authorization of the emergency use of in vitro diagnostic tests for detection of SARS-CoV-2 virus and/or diagnosis of COVID-19 infection under section 564(b)(1) of the Act, 21 U.S.C. 563JSH-7(W) (1), unless the authorization is terminated or revoked sooner. When diagnostic testing is negative, the possibility of a false negative result should be considered in the context of a patient's recent exposures and the presence of clinical signs and symptoms consistent with COVID-19. An individual without symptoms of COVID- 19 and who is not shedding SARS-CoV-2 vi rus would expect to have a negative (not detected) result in this assay. Performed At: [facility] [address] [place], [place] [id number] Rush Farmer MD OY:[id number]    [place]  Final    Comment: Performed at [facility], [place] [address], [place], [place] [address]  SARS CORONAVIRUS 2 (TAT 6-24 HRS) Nasopharyngeal Nasopharyngeal Swab  Status: None   Collection Time: 12/15/19 10:47 PM   Specimen: Nasopharyngeal Swab  Result Value Ref Range Status   SARS Coronavirus 2 NEGATIVE NEGATIVE Final    Comment: (NOTE) SARS-CoV-2 target nucleic acids are NOT DETECTED. The SARS-CoV-2 RNA is generally detectable in upper and lower respiratory specimens during the acute phase of infection. Negative results do not preclude SARS-CoV-2 infection, do not rule out co-infections with other pathogens, and should not be used as the sole basis for treatment or other patient management decisions. Negative results must be combined with clinical observations, patient history, and epidemiological information. The expected result is Negative. Fact Sheet for Patients: SugarRoll.be Fact Sheet for Healthcare Providers: https://www.woods-mathews.com/ This test is not yet  approved or cleared by the [place] FDA and  has been authorized for detection and/or diagnosis of SARS-CoV-2 by FDA under an Emergency Use Authorization (EUA). This EUA will remain  in effect (meaning this test can be used) for the duration of the COVID-19 declaration under Section 56 4(b)(1) of the Act, 21 U.S.C. section 360bbb-3(b)(1), unless the authorization is terminated or revoked sooner. Performed at [facility], [employer] [address] [place],  [address]      Labs: Basic Metabolic Panel: Recent Labs  Lab 12/15/19 1614  NA 140  K 4.1  CL 102  CO2 28  GLUCOSE 71  BUN 20  CREATININE 1.46*  CALCIUM 9.5   Liver Function Tests: No results for input(s): AST, ALT, ALKPHOS, BILITOT, PROT, ALBUMIN in the last 168 hours. No results for input(s): LIPASE, AMYLASE in the last 168 hours. No results for input(s): AMMONIA in the last 168 hours. CBC: Recent Labs  Lab 12/15/19 1614  WBC 9.4  NEUTROABS 5.7  HGB 13.4  HCT 45.0  MCV 76.5*  PLT 197   Cardiac Enzymes: No results for input(s): CKTOTAL, CKMB, CKMBINDEX, TROPONINI in the last 168 hours. BNP: BNP (last 3 results) No results for input(s): BNP in the last 8760 hours.  ProBNP (last 3 results) No results for input(s): PROBNP in the last 8760 hours.  CBG: No results for input(s): GLUCAP in the last 168 hours.     Signed:  Cristal Ford  [facility] 12/17/2019, 1:57 PM

## 2019-12-17 NOTE — Progress Notes (Signed)
Patient has home CPAP unit but needed help with setup. Machine is seyup and doing well. Patient is using powernsouce from our machine so machine is in room and educated patient on not to leave with our plug in power source. Also RN aware and leaving note for next shift if patient is Discharged later today.

## 2019-12-17 NOTE — Consult Note (Addendum)
Cardiology Consultation:   Patient ID: Martin Rubio MRN: [id number]; DOB: 1946/10/17  Admit date: 12/15/2019 Date of Consult: 12/17/2019  Primary Care Provider: [name], DO Primary Cardiologist: Minus Breeding, MD  Primary Electrophysiologist:  None [name]   Patient Profile:   Martin Rubio is a 74 y.o. male with a hx of symptomatic tachy-brady who is being seen today for the evaluation of pleuritic chest pain after PPM insertion at the request of Dr. Ree Kida.  History of Present Illness:   Martin Rubio presented last week with symptomatic atrial fib and pauses and underwent PPM insertion. He developed pleuritic chest pain yesteday and was admitted for evaluation. His symptoms have resolved. He denies fever/chills/shortness of breath or edema.  Heart Pathway Score:     Past Medical History:  Diagnosis Date  . Bronchitis   . Diverticulosis   . FH: BPH (benign prostatic hypertrophy)   . History of Graves' disease 01/19/2018   S/p ablation  . HTN (hypertension)   . Hyperlipidemia   . Obesity   . Psoriatic arthritis ([place])   . Rheumatoid arthritis ([place]) 08/07/2014  . Sleep apnea    CPAP  . Thyroid disease     Past Surgical History:  Procedure Laterality Date  . INGUINAL HERNIA REPAIR     bilateral  . KNEE SURGERY     bilateral arthroscopic  . PACEMAKER IMPLANT N/A 12/13/2019   Procedure: PACEMAKER IMPLANT;  Surgeon: Evans Lance, MD;  Location: [facility];  Service: Cardiovascular;  Laterality: N/A;  . TRANSURETHRAL RESECTION OF PROSTATE    . UMBILICAL HERNIA REPAIR       Home Medications:  Prior to Admission medications   Medication Sig Start Date End Date Taking? Authorizing Provider  alendronate (FOSAMAX) 70 MG tablet Take 1 tablet (70 mg total) by mouth once a week. Take with a full glass of water on an empty stomach. Patient taking differently: Take 70 mg by mouth once a week. Take with a full glass of water on an empty stomach.  Wednesday 04/13/19    [name], DO  Alirocumab (PRALUENT) 150 MG/ML SOAJ Inject 150 mg into the skin every 14 (fourteen) days. 11/22/19   Minus Breeding, MD  apixaban (ELIQUIS) 5 MG TABS tablet Take 1 tablet (5 mg total) by mouth 2 (two) times daily. 12/17/19   Shirley Friar, PA-C  Cholecalciferol (VITAMIN D) 125 MCG (5000 UT) CAPS Take 5,000 Units by mouth daily.    [provider]  fluticasone (FLONASE) 50 MCG/ACT nasal spray Place 1 spray into both nostrils as needed for allergies.     [provider]  golimumab 2 mg/kg in sodium chloride 0.9 % Inject 2 mg/kg into the vein every 8 (eight) weeks.    [provider]  levothyroxine (SYNTHROID) 125 MCG tablet TAKE 1 TABLET (125 MCG TOTAL) BY MOUTH DAILY ON AN EMPTY STOMACH Patient taking differently: Take 125 mcg by mouth daily before breakfast.  09/07/19   [name], DO  losartan (COZAAR) 50 MG tablet Take 1 tablet (50 mg total) by mouth 2 (two) times daily. 09/23/18   Minus Breeding, MD  meclizine (ANTIVERT) 25 MG tablet Take 25 mg by mouth 3 (three) times daily as needed for dizziness.    [provider]  Melatonin 5 MG TABS Take 5 mg by mouth at bedtime.    [provider]  pantoprazole (PROTONIX) 40 MG tablet Take 40 mg by mouth daily. 09/18/19   [provider]  tamsulosin (  FLOMAX) 0.4 MG CAPS capsule Take 0.4 mg by mouth at bedtime.  03/01/19   [provider]  triamcinolone cream (KENALOG) 0.1 % Apply 1 application topically as needed for itching. 08/22/19   [provider]  zinc gluconate 50 MG tablet Take 50 mg by mouth at bedtime.    [provider]    Inpatient Medications: Scheduled Meds: . apixaban  5 mg Oral BID  . cholecalciferol  5,000 Units Oral Daily  . levothyroxine  125 mcg Oral QAC breakfast  . losartan  50 mg Oral BID  . Melatonin  4.5 mg Oral QHS  . pantoprazole  40 mg Oral Daily  . tamsulosin  0.4 mg Oral QHS  . zinc sulfate  220 mg Oral  QHS   Continuous Infusions:  PRN Meds: acetaminophen, fluticasone, ondansetron (ZOFRAN) IV  Allergies:    Allergies  Allergen Reactions  . Codeine     Tension, nasty feeling   . Nsaids Other (See Comments)    Renal insufficiency  . Prednisone Other (See Comments)    Unknown/ some times takes  . Statins Other (See Comments)    Joint pain   . Sulfa Antibiotics Other (See Comments)    Joint pain    Social History:   Social History   Socioeconomic History  . Marital status: Married    Spouse name: Not on file  . Number of children: 2  . Years of education: Not on file  . Highest education level: Not on file  Occupational History  . Occupation: Retired    Fish farm manager: [employer]  Tobacco Use  . Smoking status: Former Smoker    Types: Cigarettes  . Smokeless tobacco: Never Used  . Tobacco comment: Quit 30 years ago.  Substance and Sexual Activity  . Alcohol use: No  . Drug use: No  . Sexual activity: Not on file  Other Topics Concern  . Not on file  Social History Narrative   Lives with wife.   Social Determinants of Health   Financial Resource Strain:   . Difficulty of Paying Living Expenses: Not on file  Food Insecurity:   . Worried About Charity fundraiser in the Last Year: Not on file  . Ran Out of Food in the Last Year: Not on file  Transportation Needs:   . Lack of Transportation (Medical): Not on file  . Lack of Transportation (Non-Medical): Not on file  Physical Activity:   . Days of Exercise per Week: Not on file  . Minutes of Exercise per Session: Not on file  Stress:   . Feeling of Stress : Not on file  Social Connections:   . Frequency of Communication with Friends and Family: Not on file  . Frequency of Social Gatherings with Friends and Family: Not on file  . Attends Religious Services: Not on file  . Active Member of Clubs or Organizations: Not on file  . Attends Archivist Meetings: Not on file  . Marital Status: Not on file    Intimate Partner Violence:   . Fear of Current or Ex-Partner: Not on file  . Emotionally Abused: Not on file  . Physically Abused: Not on file  . Sexually Abused: Not on file    Family History:    Family History  Problem Relation Age of Onset  . CAD Father 28       Died age 30  . CAD Mother 50       CABG  . Diabetes  Brother      ROS:  Please see the history of present illness.   All other ROS reviewed and negative.     Physical Exam/Data:   Vitals:   12/16/19 1300 12/16/19 1453 12/16/19 2112 12/17/19 0540  BP: (!) 138/94 (!) 132/56 (!) 162/76 (!) 152/91  Pulse: 66 72 (!) 59 60  Resp: 16  16 16   Temp:  98 F (36.7 C) 98.4 F (36.9 C) 98 F (36.7 C)  TempSrc:  Oral Oral Oral  SpO2: 97% 96% 95% 97%  Weight:  98 kg  97.5 kg  Height:  5\' 10"  (1.778 m)      Intake/Output Summary (Last 24 hours) at 12/17/2019 0907 Last data filed at 12/17/2019 0842 Gross per 24 hour  Intake 240 ml  Output --  Net 240 ml   Last 3 Weights 12/17/2019 12/16/2019 12/15/2019  Weight (lbs) 214 lb 14.4 oz 216 lb 1.6 oz 220 lb  Weight (kg) 97.478 kg 98.022 kg 99.791 kg     Body mass index is 30.83 kg/m.  General:  Well nourished, well developed, in no acute distress HEENT: normal Lymph: no adenopathy Neck: no JVD Endocrine:  No thryomegaly Vascular: No carotid bruits; FA pulses 2+ bilaterally without bruits  Cardiac:  normal S1, S2; RRR; no murmur  Lungs:  clear to auscultation bilaterally, no wheezing, rhonchi or rales; well healed PPM incision Abd: soft, nontender, no hepatomegaly  Ext: no edema Musculoskeletal:  No deformities, BUE and BLE strength normal and equal Skin: warm and dry  Neuro:  CNs 2-12 intact, no focal abnormalities noted Psych:  Normal affect   EKG:  The EKG was personally reviewed and demonstrates:  nsr with AV pacing Telemetry:  Telemetry was personally reviewed and demonstrates:  NSR with AV pacing  Relevant CV Studies: none  Laboratory Data:  High  Sensitivity Troponin:   Recent Labs  Lab 12/15/19 1614 12/15/19 1727 12/15/19 2021 12/16/19 0558  TROPONINIHS 133* 116* 129* 140*     Chemistry Recent Labs  Lab 12/15/19 1614  NA 140  K 4.1  CL 102  CO2 28  GLUCOSE 71  BUN 20  CREATININE 1.46*  CALCIUM 9.5  GFRNONAA 46*  GFRAA 54*  ANIONGAP 10    No results for input(s): PROT, ALBUMIN, AST, ALT, ALKPHOS, BILITOT in the last 168 hours. Hematology Recent Labs  Lab 12/15/19 1614  WBC 9.4  RBC 5.88*  HGB 13.4  HCT 45.0  MCV 76.5*  MCH 22.8*  MCHC 29.8*  RDW 16.1*  PLT 197   BNPNo results for input(s): BNP, PROBNP in the last 168 hours.  DDimer  Recent Labs  Lab 12/15/19 1614  DDIMER 1.54*     Radiology/Studies:  DG Chest 2 View  Result Date: 12/15/2019 CLINICAL DATA:  Central chest pain. EXAM: CHEST - 2 VIEW COMPARISON:  December 13, 2019 FINDINGS: The lateral view is limited in evaluation secondary to positioning of the patient's upper extremities. A dual lead AICD is noted. There is no evidence of acute infiltrate, pleural effusion or pneumothorax. The heart size and mediastinal contours are within normal limits. There is tortuosity of the descending thoracic aorta. Degenerative changes seen throughout the thoracic spine. IMPRESSION: No active cardiopulmonary disease. Electronically Signed   By: Virgina Norfolk M.D.   On: 12/15/2019 16:51   DG Chest 2 View  Result Date: 12/13/2019 CLINICAL DATA:  Cardiac device in place. EXAM: CHEST - 2 VIEW COMPARISON:  October 30, 2015 FINDINGS: A dual lead AICD is  noted with adequate lead positioning. This represents a new finding when compared to the prior study. There is no evidence of acute infiltrate, pleural effusion or pneumothorax. The heart size and mediastinal contours are within normal limits. Multilevel degenerative changes seen throughout the thoracic spine. IMPRESSION: 1. Interval dual lead AICD placement and positioning, as described above, when compared to the  prior study dated October 30, 2015. 2. No active cardiopulmonary disease. Electronically Signed   By: Virgina Norfolk M.D.   On: 12/13/2019 17:37   CT Angio Chest PE W/Cm &/Or Wo Cm  Result Date: 12/15/2019 CLINICAL DATA:  Elevated D-dimer with anterior chest pain. EXAM: CT ANGIOGRAPHY CHEST WITH CONTRAST TECHNIQUE: Multidetector CT imaging of the chest was performed using the standard protocol during bolus administration of intravenous contrast. Multiplanar CT image reconstructions and MIPs were obtained to evaluate the vascular anatomy. CONTRAST:  16mL OMNIPAQUE IOHEXOL 350 MG/ML SOLN COMPARISON:  None. FINDINGS: Cardiovascular: There is moderate severity calcification of the thoracic aorta. Satisfactory opacification of the pulmonary arteries to the segmental level. No evidence of pulmonary embolism. Normal heart size. No pericardial effusion. Mediastinum/Nodes: No enlarged mediastinal, hilar, or axillary lymph nodes. Thyroid gland, trachea, and esophagus demonstrate no significant findings. Lungs/Pleura: Lungs are clear. No pleural effusion or pneumothorax. Upper Abdomen: Numerous gallstones are seen within the lumen of an otherwise normal-appearing gallbladder. Musculoskeletal: No chest wall abnormality. No acute or significant osseous findings. Review of the MIP images confirms the above findings. IMPRESSION: 1. Negative for pulmonary embolism or other acute intrathoracic process. 2. Moderate severity calcification of the thoracic aorta. 3. Cholelithiasis. 4. Aortic atherosclerosis. Aortic Atherosclerosis (ICD10-I70.0). Electronically Signed   By: Virgina Norfolk M.D.   On: 12/15/2019 18:49   EP PPM/ICD IMPLANT  Result Date: 12/13/2019 CONCLUSIONS:  1. Successful implantation of a medtronic dual-chamber pacemaker for symptomatic bradycardia due to sinus node dysfunction and mobitz 2, second degree AV block  2. No early apparent complications.       Cristopher Peru, MD 12/13/2019 12:29 PM    ECHOCARDIOGRAM LIMITED  Result Date: 12/16/2019   ECHOCARDIOGRAM LIMITED REPORT   Patient Name:   Martin Rubio Date of Exam: 12/16/2019 Medical Rec #:  [id number]       Height:       70.0 in Accession #:    [id number]      Weight:       216.1 lb Date of Birth:  11/14/1946      BSA:          2.16 m Patient Age:    35 years        BP:           132/56 mmHg Patient Gender: M               HR:           60 bpm. Exam Location:  Inpatient  Procedure: Limited Echo, Cardiac Doppler and Color Doppler STAT ECHO Indications:    chest pain 786.50  History:        Patient has prior history of Echocardiogram examinations, most                 recent 09/25/2019.  Sonographer:    Johny Chess Referring Phys: [id number] [place] T TU IMPRESSIONS  1. Left ventricular ejection fraction, by visual estimation, is 55 to 60%. The left ventricle has normal function. There is no increased left ventricular wall thickness.  2. Left ventricular diastolic parameters are consistent with Grade I  diastolic dysfunction (impaired relaxation).  3. The left ventricle has no regional wall motion abnormalities.  4. Global right ventricle has normal systolc function.The right ventricular size is normal. no increase in right ventricular wall thickness.  5. The mitral valve is normal in structure. No evidence of mitral valve regurgitation. No evidence of mitral stenosis.  6. The tricuspid valve was normal in structure. Tricuspid valve regurgitation is not demonstrated.  7. Tricuspid valve regurgitation is not demonstrated.  8. Mild aortic valve sclerosis without stenosis.  9. The pulmonic valve was normal in structure. 10. The inferior vena cava is normal in size with greater than 50% respiratory variability, suggesting right atrial pressure of 3 mmHg. FINDINGS  Left Ventricle: Left ventricular ejection fraction, by visual estimation, is 55 to 60%. The left ventricle has normal function. The left ventricle has no regional wall motion abnormalities.  There is no increased left ventricular wall thickness. Left ventricular diastolic parameters are consistent with Grade I diastolic dysfunction (impaired relaxation). Normal left atrial pressure. Right Ventricle: The right ventricular size is normal. No increase in right ventricular wall thickness. Global RV systolic function is has normal systolic function. Left Atrium: Left atrial size was normal in size. Right Atrium: Right atrial size was normal in size. Right atrial pressure is estimated at 5 mmHg. Pericardium: There is no evidence of pericardial effusion is seen. There is no evidence of pericardial effusion. Mitral Valve: The mitral valve is normal in structure. No evidence of mitral valve stenosis by observation. MV Area by PHT, 2.26 cm. MV PHT, 97.15 msec. No evidence of mitral valve regurgitation. Tricuspid Valve: The tricuspid valve is normal in structure. Tricuspid valve regurgitation is not demonstrated. Aortic Valve: The aortic valve is tricuspid. Aortic valve regurgitation is not visualized. Mild aortic valve sclerosis is present, with no evidence of aortic valve stenosis. Pulmonic Valve: The pulmonic valve was normal in structure. Pulmonic valve regurgitation is not visualized by color flow Doppler. Pulmonic regurgitation is not visualized by color flow Doppler. Aorta: The aortic root, ascending aorta and aortic arch are all structurally normal, with no evidence of dilitation or obstruction. Venous: The inferior vena cava is normal in size with greater than 50% respiratory variability, suggesting right atrial pressure of 3 mmHg. Shunts: There is no evidence of a patent foramen ovale. No ventricular septal defect is seen or detected. There is no evidence of an atrial septal defect. No atrial level shunt detected by color flow Doppler.  LEFT VENTRICLE         Normals PLAX 2D LVIDd:         3.10 cm 3.6 cm   Diastology                 Normals LVIDs:         2.30 cm 1.7 cm   LV e' lateral:   7.94 cm/s 6.42  cm/s LV SV:         20 ml   79 ml    LV E/e' lateral: 7.8       15.4 LV SV Index:   8.89    45 ml/m2 LV e' medial:    6.20 cm/s 6.96 cm/s                                 LV E/e' medial:  10.0      6.96  LEFT ATRIUM         Index LA  diam:    3.20 cm 1.48 cm/m  AORTIC VALVE              Normals LVOT Vmax:   174.00 cm/s LVOT Vmean:  126.000 cm/s 75 cm/s LVOT VTI:    0.353 m      25.3 cm  AORTA                 Normals Ao Root diam: 2.70 cm 31 mm MITRAL VALVE              Normals MV Area (PHT): 2.26 cm             SHUNTS MV PHT:        97.15 msec 55 ms     Systemic VTI: 0.35 m MV Decel Time: 335 msec   187 ms MV E velocity: 61.70 cm/s 103 cm/s MV A velocity: 83.10 cm/s 70.3 cm/s MV E/A ratio:  0.74       1.5  Fransico Him MD Electronically signed by Fransico Him MD Signature Date/Time: 12/16/2019/7:59:20 PMThe mitral valve is normal in structure.    Final     Assessment and Plan:   1. Pleuritic chest pain - his symptoms have resolved. I do not see a cxr but this should be done if not already. Microperforation of one of his newly planted pacing leads is a possibility. I would suggest interrogation of his PPM and if it is functioning normally and his pain remains controlled, then he can be discharged home with followup as previously arranged on our device clinic. 2. PAF - he is currently in NSR but does go in and out of atrial fib. He is asymptomatic.     For questions or updates, please contact [place] Please consult [email or web address] for contact info under   Signed, Cristopher Peru, MD  12/17/2019 9:07 AM  EP Addendum  Pacemaker interrogation carried out demonstrates normal RA/RV pacing/sensing and impedences. He has had 1.5 hours of PAF. I would recommend discharging the patient back on Eliquis. Continue outpatient meds. Followup as scheduled. NSAID's in limited amounts for chest pain if it recurs.  Mikle Bosworth.D.

## 2019-12-18 ENCOUNTER — Telehealth: Payer: Self-pay | Admitting: *Deleted

## 2019-12-18 ENCOUNTER — Telehealth: Payer: Self-pay | Admitting: Cardiology

## 2019-12-18 NOTE — Telephone Encounter (Signed)
Per pt went to hospital on 12/15/19 and was discharged on 12/17/19.Pt has appt with you on 01/29/20

## 2019-12-18 NOTE — Telephone Encounter (Signed)
Pt declines TCM at this time. States he will follow up with cardiology.

## 2019-12-18 NOTE — Telephone Encounter (Signed)
See other message

## 2019-12-18 NOTE — Telephone Encounter (Signed)
Can he get into see a PA either at [facility] or pacer clinic.

## 2019-12-18 NOTE — Telephone Encounter (Signed)
New Message  Pt is returning a call back from Friday from [place].   Please call back

## 2019-12-19 ENCOUNTER — Telehealth: Payer: Self-pay | Admitting: Family Medicine

## 2019-12-19 NOTE — Telephone Encounter (Signed)
That is okay, he must schedule an nonurgent, nonacute office visit to get established with me before I become responsible for refills. Please arrange if patient so desires

## 2019-12-19 NOTE — Telephone Encounter (Signed)
Copied from [place] [id number]. Topic: Appointment Scheduling - Transfer of Care >> Dec 19, 2019  2:04 PM Alanda Slim E wrote: Pt is requesting to transfer FROM: [name]  Pt is requesting to transfer TO: Dr. Larose Kells  Reason for requested transfer: [name] is leaving [facility] to go to [facility] and Pt doesn't want to go [facility] for care   Send CRM to patient's current PCP (transferring FROM).

## 2019-12-19 NOTE — Telephone Encounter (Signed)
Received notification from [facility] since patient has insurance Eliquis not approved. May be approved once patient spends 3% total household income on Rx's. Left message to call back

## 2019-12-20 ENCOUNTER — Encounter: Payer: Self-pay | Admitting: Cardiology

## 2019-12-20 ENCOUNTER — Telehealth: Payer: Self-pay | Admitting: Cardiology

## 2019-12-20 NOTE — Telephone Encounter (Signed)
  1. Has your device fired? No  2. Is you device beeping? No  3. Are you experiencing draining or swelling at device site? Doesn't know still wearing bandage   4. Are you calling to see if we received your device transmission? No  5. Have you passed out? No  Patient is calling stating he was advised to take a shower/bath today 12/20/19 with his bandage on. He states his wound is sore and warm to the touch & wanted to clarify he is still to do that today with the condition of his wound. Please advise.    Please route to [place]

## 2019-12-20 NOTE — Telephone Encounter (Signed)
I gave [place] number and he said he will call when he is ready to schedule appointment

## 2019-12-20 NOTE — Telephone Encounter (Signed)
Patient is calling stating he is returning Martin Rubio's call. Please advise.

## 2019-12-20 NOTE — Telephone Encounter (Signed)
Patient reports that he has steri-strips in place, no redness, no drainage from wound site, no increased swelling at pacemaker area since impantation. Patient reports area feels warm to touch and is same temperature as the skin on his chest. Reports he has no fever and area over steri-strips is not hot to touch. Informed it is ok to shower and provided with device clinic # if he has drainage, fever, increased swelling or redness at wound site.

## 2019-12-21 NOTE — Telephone Encounter (Signed)
This encounter was created in error - please disregard.

## 2019-12-21 NOTE — Telephone Encounter (Signed)
Discussed with patient and he will resubmit after meeting the 3% out of pocket. He will call back then

## 2019-12-26 ENCOUNTER — Other Ambulatory Visit: Payer: Self-pay

## 2019-12-26 ENCOUNTER — Ambulatory Visit (INDEPENDENT_AMBULATORY_CARE_PROVIDER_SITE_OTHER): Payer: Medicare Other | Admitting: *Deleted

## 2019-12-26 DIAGNOSIS — I495 Sick sinus syndrome: Secondary | ICD-10-CM

## 2019-12-26 NOTE — Patient Instructions (Signed)
All the office if you have any drainage , increased swelling, or redness at site of wound.

## 2020-01-01 ENCOUNTER — Encounter: Payer: Self-pay | Admitting: Internal Medicine

## 2020-01-01 ENCOUNTER — Other Ambulatory Visit: Payer: Self-pay

## 2020-01-01 ENCOUNTER — Ambulatory Visit (INDEPENDENT_AMBULATORY_CARE_PROVIDER_SITE_OTHER): Payer: Medicare Other | Admitting: Internal Medicine

## 2020-01-01 DIAGNOSIS — R071 Chest pain on breathing: Secondary | ICD-10-CM | POA: Diagnosis not present

## 2020-01-01 DIAGNOSIS — I480 Paroxysmal atrial fibrillation: Secondary | ICD-10-CM | POA: Diagnosis not present

## 2020-01-01 DIAGNOSIS — G629 Polyneuropathy, unspecified: Secondary | ICD-10-CM | POA: Diagnosis not present

## 2020-01-01 DIAGNOSIS — N529 Male erectile dysfunction, unspecified: Secondary | ICD-10-CM

## 2020-01-01 DIAGNOSIS — I10 Essential (primary) hypertension: Secondary | ICD-10-CM | POA: Diagnosis not present

## 2020-01-01 NOTE — Progress Notes (Signed)
Subjective:    Patient ID: Martin Rubio, male    DOB: 04/22/1946, 74 y.o.   MRN: [id number]  DOS:  01/01/2020 Type of visit - description: Attempted  to make this a video visit, due to technical difficulties from the patient side it was not possible  thus we proceeded with a Virtual Visit via Telephone    I connected with above mentioned patient  by telephone and verified that I am speaking with the correct person using two identifiers.  THIS ENCOUNTER IS A VIRTUAL VISIT DUE TO COVID-19 - PATIENT WAS NOT SEEN IN THE OFFICE. PATIENT HAS CONSENTED TO VIRTUAL VISIT / TELEMEDICINE VISIT   Location of patient: home  Location of provider: office  I discussed the limitations, risks, security and privacy concerns of performing an evaluation and management service by telephone and the availability of in person appointments. I also discussed with the patient that there may be a patient responsible charge related to this service. The patient expressed understanding and agreed to proceed.   History of Present Illness: New patient to me. Patient was admitted and discharged 12/16/2018. He presented to the ER with need sternal chest pain 1 day PTA.,  Headache. At the ER, the d-dimer was elevated, CT chest  negative for pulmonary emboli. Troponins were noted to be elevated and he was admitted.  Chest pain and elevated troponins were felt to be secondary to recent pacemaker placement.  Some MSK involvement.  BP Readings from Last 3 Encounters:  12/17/19 (!) 152/91  12/13/19 (!) 157/71  11/22/19 (!) 180/90     Review of Systems Since he left the hospital he is feeling well. Still has mild chest pain on and off. He has a number of other concerns:  DOE at baseline More fatigued than usual.  Denies lower extremity edema, palpitations.  He does have occasional lower extremity swelling.  No nausea, vomiting, diarrhea  Also reports 2 weeks history of headache, mild, typically at the end of  the day, decreased with Tylenol.  He is anticoagulated but denies a history of head injury. Is not the worst headache of his life, no associated nausea-photophobia-phonophobia-fever chills or  weight loss-visual disturbances. Headaches are different from previous migraines.  Past Medical History:  Diagnosis Date  . Bronchitis   . Diverticulosis   . FH: BPH (benign prostatic hypertrophy)   . History of Graves' disease 01/19/2018   S/p ablation  . HTN (hypertension)   . Hyperlipidemia   . Obesity   . Psoriatic arthritis ([place])   . Rheumatoid arthritis ([place]) 08/07/2014  . Sleep apnea    CPAP  . Thyroid disease     Past Surgical History:  Procedure Laterality Date  . INGUINAL HERNIA REPAIR     bilateral  . KNEE SURGERY     bilateral arthroscopic  . PACEMAKER IMPLANT N/A 12/13/2019   Procedure: PACEMAKER IMPLANT;  Surgeon: Evans Lance, MD;  Location: [facility];  Service: Cardiovascular;  Laterality: N/A;  . TRANSURETHRAL RESECTION OF PROSTATE    . UMBILICAL HERNIA REPAIR          Objective:   Physical Exam There were no vitals taken for this visit. This is telephone virtual visit, he is alert oriented x3, no apparent distress, speaking in complete sentences    Assessment    Assessment HTN High cholesterol, seen at the lipid clinic Hypothyroidism Neuropathy: Used to see Dr. Trula Ore  Psoriatic Arthritis [name]  Osteoporosis: on fosamax rx by PCP CKD  mild,stable . Creatinine ~1.5 CV: -Paroxysmal A. Fib, dx 09/2019 anticoagulated -Sick sinus syndrome, s/p pacemaker (12/13/2019) -Septal hypertrophy per echo -Left common iliac aneurysm OSA on CPAP BPH [name]  PLAN  HTN: Controlled?  We will check BPs here when he comes back, continue  Losartan. Atrial fibrillation: Diagnosed 09-2019, anticoagulated, status post pacemaker 12/13/2019, subsequently admitted to the hospital with chest pain, felt to be due to recent pacemaker placement.  He continue with chest  pain, on and off and mild.  We agreed on observation, if chest pain changes or increases he is to call cardiology or go to the ER. Osteoporosis: Last T score -1.5 on 08-2018, on Fosamax for years typically refilled by PCP.  Will need more information about osteoporosis. Hypothyroidism: Last TSH satisfactory Neuropathy: At baseline, reports pain at the foot. ED: Wonders if he could continue sildenafil, he just had a pacemaker and is not feeling 100%, recommend to hold for now. Multiple all symptoms including headache, fatigue: Reassess on RTC. RTC 4 weeks  Time spent 45 minutes, this is a new patient to me, hospital records reviewed, also extensive chart review.    I discussed the assessment and treatment plan with the patient. The patient was provided an opportunity to ask questions and all were answered. The patient agreed with the plan and demonstrated an understanding of the instructions.   The patient was advised to call back or seek an in-person evaluation if the symptoms worsen or if the condition fails to improve as anticipated.  I provided 45 minutes of non-face-to-face time during this encounter.  Kathlene November, MD   This visit occurred during the SARS-CoV-2 public health emergency.  Safety protocols were in place, including screening questions prior to the visit, additional usage of staff PPE, and extensive cleaning of exam room while observing appropriate contact time as indicated for disinfecting solutions.

## 2020-01-04 ENCOUNTER — Other Ambulatory Visit: Payer: Self-pay

## 2020-01-09 ENCOUNTER — Other Ambulatory Visit: Payer: Self-pay

## 2020-01-09 ENCOUNTER — Telehealth: Payer: Self-pay | Admitting: Internal Medicine

## 2020-01-09 ENCOUNTER — Encounter: Payer: Self-pay | Admitting: Internal Medicine

## 2020-01-09 ENCOUNTER — Ambulatory Visit (INDEPENDENT_AMBULATORY_CARE_PROVIDER_SITE_OTHER): Payer: Medicare Other | Admitting: Internal Medicine

## 2020-01-09 VITALS — BP 148/76 | HR 60 | Temp 97.8°F | Resp 16 | Ht 70.0 in | Wt 217.5 lb

## 2020-01-09 DIAGNOSIS — M810 Age-related osteoporosis without current pathological fracture: Secondary | ICD-10-CM | POA: Diagnosis not present

## 2020-01-09 DIAGNOSIS — I10 Essential (primary) hypertension: Secondary | ICD-10-CM

## 2020-01-09 DIAGNOSIS — R519 Headache, unspecified: Secondary | ICD-10-CM | POA: Diagnosis not present

## 2020-01-09 DIAGNOSIS — E039 Hypothyroidism, unspecified: Secondary | ICD-10-CM | POA: Diagnosis not present

## 2020-01-09 DIAGNOSIS — R5382 Chronic fatigue, unspecified: Secondary | ICD-10-CM | POA: Diagnosis not present

## 2020-01-09 LAB — FOLATE: Folate: 8.7 ng/mL (ref >5.9)

## 2020-01-09 LAB — VITAMIN D 25 HYDROXY (VIT D DEFICIENCY, FRACTURES): VITD: 90.6 ng/mL (ref 30.00–100.00)

## 2020-01-09 LAB — SEDIMENTATION RATE: Sed Rate: 13 mm/hr (ref 0–20)

## 2020-01-09 LAB — VITAMIN B12: Vitamin B-12: 186 pg/mL — ABNORMAL LOW (ref 211–911)

## 2020-01-09 LAB — TSH: TSH: 2.3 u[IU]/mL (ref 0.35–4.50)

## 2020-01-09 NOTE — Patient Instructions (Addendum)
Please schedule Medicare Wellness with Glenard Haring.   GO TO THE LAB : Get the blood work     [place] back for a for a checkup in 4 weeks, please make an appointment

## 2020-01-09 NOTE — Progress Notes (Signed)
Pre visit review using our clinic review tool, if applicable. No additional management support is needed unless otherwise documented below in the visit note. 

## 2020-01-09 NOTE — Telephone Encounter (Signed)
I do not see a contraindication, refills need to be obtained from Dr. Felipa Eth, urology

## 2020-01-09 NOTE — Progress Notes (Signed)
Subjective:    Patient ID: Martin Rubio, male    DOB: 1946-10-10, 74 y.o.   MRN: [id number]  DOS:  01/09/2020 Type of visit - description: Acute. The patient recently established with me via a virtual visit I asked him to come back due to multiple symptoms.  1 year history of headaches, located at the top of the head, typically at night, decreased with Tylenol, often times the headache resurface in the middle of the night and requires a second round of Tylenol. This is not associated with nausea, vomiting. No neck pain, not the worst headache of his life, "I used to have migraines"  Also, 3 to 4 years history of fatigue described as simply lack of energy. Worse since the pacemaker was placed?. He reports no snoring due to compliance with CPAP. He also has DOE after walking 100 yards. Denies chest pain, lower extremity edema or palpitations.  Foot pain: Reports pain at the base of the left great toe, typically at night, wonders if his neuropathy. Denies any burning on the plantar area. No redness or swelling.    Review of Systems See above   Past Medical History:  Diagnosis Date  . Bronchitis   . Diverticulosis   . FH: BPH (benign prostatic hypertrophy)   . History of Graves' disease 01/19/2018   S/p ablation  . HTN (hypertension)   . Hyperlipidemia   . Obesity   . Psoriatic arthritis ([place])   . Rheumatoid arthritis ([place]) 08/07/2014  . Sleep apnea    CPAP  . Thyroid disease     Past Surgical History:  Procedure Laterality Date  . INGUINAL HERNIA REPAIR     bilateral  . KNEE SURGERY     bilateral arthroscopic  . PACEMAKER IMPLANT N/A 12/13/2019   Procedure: PACEMAKER IMPLANT;  Surgeon: Evans Lance, MD;  Location: [facility];  Service: Cardiovascular;  Laterality: N/A;  . TRANSURETHRAL RESECTION OF PROSTATE    . UMBILICAL HERNIA REPAIR          Objective:   Physical Exam BP (!) 148/76 (BP Location: Left Arm, Patient Position: Sitting, Cuff Size:  Normal)   Pulse 60   Temp 97.8 F (36.6 C) (Temporal)   Resp 16   Ht 5\' 10"  (1.778 m)   Wt 217 lb 8 oz (98.7 kg)   SpO2 100%   BMI 31.21 kg/m  General:   Well developed, NAD, BMI noted.  HEENT:  Normocephalic . Face symmetric, atraumatic Lungs:  CTA B Normal respiratory effort, no intercostal retractions, no accessory muscle use. Heart: RRR, soft systolic murmur?  .  Lower extremities: No edema Normal pedal pulses Feet: No evidence of synovitis at any of the joints.  Not particularly TTP at the left great toe Abdomen:  Not distended, soft, non-tender. No rebound or rigidity.   Skin: Not pale. Not jaundice Neurologic:  alert & oriented X3.  Speech normal, gait appropriate for age and unassisted Motor symmetric. EOMI. Psych--  Cognition and judgment appear intact.  Cooperative with normal attention span and concentration.  Behavior appropriate. No anxious or depressed appearing.     Assessment    ASSESSMENT  (transfer to me 12/2019) HTN High cholesterol, seen at the lipid clinic Hypothyroidism  Psoriatic Arthritis [name]  Osteoporosis: on fosamax rx by PCP CKD mild,stable . Creatinine ~1.5 CV: -Paroxysmal A. Fib, dx 09/2019 anticoagulated -Sick sinus syndrome, s/p pacemaker (12/13/2019) -Septal hypertrophy per echo -Left common iliac aneurysm OSA on CPAP BPH [name]  PLAN Multiple symptoms Fatigue: Recent hemoglobin normal, last echocardiogram 09-2019: EF 65%, creatinine slightly elevated, last TSH satisfactory, good compliance with CPAP, etiology not completely clear. Plan: D78, folic acid, vitamin D Headache: Chronic, as described above, check a sed rate Neuropathy?  Previously he reported neuropathy, on chart review I do not see evidence of that.  Foot pain likely unrelated to neuropathy.  See next Foot pain: Given location likely DJD.  Recommend observation for now Osteoporosis: T score 02-2018 (-) 1.7, T score 9-20 19: (-) 1.5, on Fosamax, unclear  for how long has he been treated, I advised him to review his record because after 5-year he needs a holiday. Hypothyroidism: Check a TSH, RF as needed RTC 4 weeks   This visit occurred during the SARS-CoV-2 public health emergency.  Safety protocols were in place, including screening questions prior to the visit, additional usage of staff PPE, and extensive cleaning of exam room while observing appropriate contact time as indicated for disinfecting solutions.

## 2020-01-09 NOTE — Telephone Encounter (Signed)
Please advise 

## 2020-01-09 NOTE — Telephone Encounter (Signed)
Spoke w/ Pt- informed of recommendations. Pt verbalized understanding.  

## 2020-01-09 NOTE — Telephone Encounter (Signed)
Pt wants to know if it's okay to take  Sildenafil. States he forgot to ask during his visit. Please call back to advise

## 2020-01-10 DIAGNOSIS — M810 Age-related osteoporosis without current pathological fracture: Secondary | ICD-10-CM | POA: Insufficient documentation

## 2020-01-10 DIAGNOSIS — Z09 Encounter for follow-up examination after completed treatment for conditions other than malignant neoplasm: Secondary | ICD-10-CM | POA: Insufficient documentation

## 2020-01-10 NOTE — Assessment & Plan Note (Signed)
Multiple symptoms Fatigue: Recent hemoglobin normal, last echocardiogram 09-2019: EF 65%, creatinine slightly elevated, last TSH satisfactory, good compliance with CPAP, etiology not completely clear. Plan: M15, folic acid, vitamin D Headache: Chronic, as described above, check a sed rate Neuropathy?  Previously he reported neuropathy, on chart review I do not see evidence of that.  Foot pain likely unrelated to neuropathy.  See next Foot pain: Given location likely DJD.  Recommend observation for now Osteoporosis: T score 02-2018 (-) 1.7, T score 9-20 19: (-) 1.5, on Fosamax, unclear for how long has he been treated, I advised him to review his record because after 5-year he needs a holiday. Hypothyroidism: Check a TSH, RF as needed RTC 4 weeks

## 2020-01-11 LAB — CUP PACEART INCLINIC DEVICE CHECK
Battery Remaining Longevity: 132 mo
Battery Voltage: 3.21 V
Brady Statistic AP VP Percent: 63.83 %
Brady Statistic AP VS Percent: 0.02 %
Brady Statistic AS VP Percent: 35.9 %
Brady Statistic AS VS Percent: 0.24 %
Brady Statistic RA Percent Paced: 63.8 %
Brady Statistic RV Percent Paced: 99.73 %
Date Time Interrogation Session: 20210119161900
Implantable Lead Implant Date: 20210105190000
Implantable Lead Implant Date: 20210105190000
Implantable Lead Location: 753859
Implantable Lead Location: 753860
Implantable Lead Model: 3830
Implantable Lead Model: 5076
Implantable Pulse Generator Implant Date: 20210105190000
Lead Channel Impedance Value: 342 Ohm
Lead Channel Impedance Value: 399 Ohm
Lead Channel Impedance Value: 475 Ohm
Lead Channel Impedance Value: 608 Ohm
Lead Channel Pacing Threshold Amplitude: 0.5 V
Lead Channel Pacing Threshold Amplitude: 0.5 V
Lead Channel Pacing Threshold Pulse Width: 0.4 ms
Lead Channel Pacing Threshold Pulse Width: 0.5 ms
Lead Channel Sensing Intrinsic Amplitude: 3.375 mV
Lead Channel Sensing Intrinsic Amplitude: 31.25 mV
Lead Channel Setting Pacing Amplitude: 3.5 V
Lead Channel Setting Pacing Amplitude: 3.5 V
Lead Channel Setting Pacing Pulse Width: 0.5 ms
Lead Channel Setting Sensing Sensitivity: 2 mV

## 2020-01-11 NOTE — Progress Notes (Signed)
Wound check appointment. Steri-strips removed. Wound without redness or edema. Incision edges approximated, wound well healed. Normal device function. Thresholds, sensing, and impedances consistent with implant measurements. Device programmed at 3.5V/auto capture programmed on for extra safety margin until 3 month visit. Histogram distribution appropriate for patient and level of activity. AT/AF burden < .1%. 1 high ventricular rates noted, EGM shows 14 beat episode of NSVT. Patient educated about wound care, arm mobility, lifting restrictions. ROV with Dr [name] 03/13/20. next remote transmission 03/14/20 and every 3 months there after.

## 2020-01-22 DIAGNOSIS — E669 Obesity, unspecified: Secondary | ICD-10-CM | POA: Diagnosis not present

## 2020-01-22 DIAGNOSIS — G4733 Obstructive sleep apnea (adult) (pediatric): Secondary | ICD-10-CM | POA: Diagnosis not present

## 2020-01-23 DIAGNOSIS — Z6831 Body mass index (BMI) 31.0-31.9, adult: Secondary | ICD-10-CM | POA: Diagnosis not present

## 2020-01-23 DIAGNOSIS — M79671 Pain in right foot: Secondary | ICD-10-CM | POA: Diagnosis not present

## 2020-01-23 DIAGNOSIS — E663 Overweight: Secondary | ICD-10-CM | POA: Diagnosis not present

## 2020-01-23 DIAGNOSIS — L4050 Arthropathic psoriasis, unspecified: Secondary | ICD-10-CM | POA: Diagnosis not present

## 2020-01-23 DIAGNOSIS — M150 Primary generalized (osteo)arthritis: Secondary | ICD-10-CM | POA: Diagnosis not present

## 2020-01-23 DIAGNOSIS — L409 Psoriasis, unspecified: Secondary | ICD-10-CM | POA: Diagnosis not present

## 2020-01-23 DIAGNOSIS — M5136 Other intervertebral disc degeneration, lumbar region: Secondary | ICD-10-CM | POA: Diagnosis not present

## 2020-01-23 DIAGNOSIS — M545 Low back pain: Secondary | ICD-10-CM | POA: Diagnosis not present

## 2020-01-27 DIAGNOSIS — Z7189 Other specified counseling: Secondary | ICD-10-CM | POA: Insufficient documentation

## 2020-01-27 NOTE — Progress Notes (Signed)
Cardiology Office Note   Date:  01/29/2020   ID:  Kerron, [place] 08-14-1946, MRN [id number]  PCP:  Colon Branch, MD  Cardiologist:   Minus Breeding, MD   Chief Complaint  Patient presents with  . Atrial Fibrillation      History of Present Illness: Martin Rubio is a 74 y.o. male who presents for follow up of HTN.  His last echo in Nov of 2016 demonstrated no septal hypertrophy.  He has had atrial fib/flutter paroxysmally with slow ventricular rates.  Since I last saw him he had pacemaker placement.    Since his pacemaker placement he is unfortunately continuing to have paroxysmal atrial fibrillation and he is very symptomatic with this.  He says that when he is in this rhythm he cannot walk up the stairs.  He feels like he is very fatigued.  He is huffing and puffing.  He feels lightheaded.  He says he does not feel like this when he is in normal sinus rhythm.  I did review his last device interrogation for this appointment.  It looks like he was spending 2 to 3 hours at a time paroxysmally in fibrillation.  Of note there are some mention of rapid heart rates but he says he never sees his heart rhythm above the 70s.  Past Medical History:  Diagnosis Date  . Bronchitis   . Diverticulosis   . FH: BPH (benign prostatic hypertrophy)   . History of Graves' disease 01/19/2018   S/p ablation  . HTN (hypertension)   . Hyperlipidemia   . Obesity   . Psoriatic arthritis ([place])   . Rheumatoid arthritis ([place]) 08/07/2014  . Sleep apnea    CPAP  . Thyroid disease     Past Surgical History:  Procedure Laterality Date  . INGUINAL HERNIA REPAIR     bilateral  . KNEE SURGERY     bilateral arthroscopic  . PACEMAKER IMPLANT N/A 12/13/2019   Procedure: PACEMAKER IMPLANT;  Surgeon: Evans Lance, MD;  Location: [facility];  Service: Cardiovascular;  Laterality: N/A;  . TRANSURETHRAL RESECTION OF PROSTATE    . UMBILICAL HERNIA REPAIR       Current Outpatient Medications    Medication Sig Dispense Refill  . acetaminophen (TYLENOL) 325 MG tablet Take 650 mg by mouth every 6 (six) hours as needed for mild pain.    Marland Kitchen alendronate (FOSAMAX) 70 MG tablet Take 1 tablet (70 mg total) by mouth once a week. Take with a full glass of water on an empty stomach. (Patient taking differently: Take 70 mg by mouth once a week. Take with a full glass of water on an empty stomach.  Wednesday) 26 tablet 1  . Alirocumab (PRALUENT) 150 MG/ML SOAJ Inject 150 mg into the skin every 14 (fourteen) days. 6 pen 3  . apixaban (ELIQUIS) 5 MG TABS tablet Take 1 tablet (5 mg total) by mouth 2 (two) times daily. 180 tablet 3  . Ascorbic Acid (VITAMIN C ADULT GUMMIES PO) Take by mouth. 2 gummies daily    . Cholecalciferol (VITAMIN D) 125 MCG (5000 UT) CAPS Take 7,000 Units by mouth daily.     . fluticasone (FLONASE) 50 MCG/ACT nasal spray Place 1 spray into both nostrils as needed for allergies.     Marland Kitchen levothyroxine (SYNTHROID) 125 MCG tablet TAKE 1 TABLET (125 MCG TOTAL) BY MOUTH DAILY ON AN EMPTY STOMACH (Patient taking differently: Take 125 mcg by mouth daily before breakfast. ) 90  tablet 1  . losartan (COZAAR) 50 MG tablet Take 1 tablet (50 mg total) by mouth 2 (two) times daily. 180 tablet 1  . meclizine (ANTIVERT) 25 MG tablet Take 25 mg by mouth 3 (three) times daily as needed for dizziness.    . Melatonin 5 MG TABS Take 5 mg by mouth at bedtime.    . pantoprazole (PROTONIX) 40 MG tablet Take 40 mg by mouth daily.    . sildenafil (REVATIO) 20 MG tablet Take 60-100 mg by mouth daily as needed for erectile dysfunction.    . tamsulosin (FLOMAX) 0.4 MG CAPS capsule Take 0.4 mg by mouth at bedtime.     . triamcinolone cream (KENALOG) 0.1 % Apply 1 application topically as needed for itching.    . zinc gluconate 50 MG tablet Take 50 mg by mouth at bedtime.     No current facility-administered medications for this visit.    Allergies:   Codeine, Nsaids, Prednisone, Statins, and Sulfa antibiotics     ROS:  Please see the history of present illness.   Otherwise, review of systems are positive for none.   All other systems are reviewed and negative.    PHYSICAL EXAM: VS:  BP 128/74   Pulse 85   Ht 5\' 10"  (1.778 m)   Wt 219 lb (99.3 kg)   BMI 31.42 kg/m  , BMI Body mass index is 31.42 kg/m. GENERAL:  Well appearing NECK:  No jugular venous distention, waveform within normal limits, carotid upstroke brisk and symmetric, no bruits, no thyromegaly LUNGS:  Clear to auscultation bilaterally CHEST: Well-healed pacemaker pocket HEART:  PMI not displaced or sustained,S1 and S2 within normal limits, no S3,  no clicks, no rubs, soft apical systolic early peaking murmur radiating out aortic outflow tract, no diastolic murmurs, irregular ABD:  Flat, positive bowel sounds normal in frequency in pitch, no bruits, no rebound, no guarding, no midline pulsatile mass, no hepatomegaly, no splenomegaly EXT:  2 plus pulses throughout, no edema, no cyanosis no clubbing   EKG:  EKG is ordered today. The ekg ordered today demonstrates atrial fibrillation, rate 85, left axis deviation, high lateral T wave inversions unchanged from previous.   Recent Labs: 12/15/2019: BUN 20; Creatinine, Ser 1.46; Hemoglobin 13.4; Platelets 197; Potassium 4.1; Sodium 140 01/09/2020: TSH 2.30    Lipid Panel    Component Value Date/Time   CHOL 152 10/20/2019 0959   TRIG 116 10/20/2019 0959   HDL 51 10/20/2019 0959   CHOLHDL 3.0 10/20/2019 0959   LDLCALC 80 10/20/2019 0959      Wt Readings from Last 3 Encounters:  01/29/20 219 lb (99.3 kg)  01/09/20 217 lb 8 oz (98.7 kg)  12/17/19 214 lb 14.4 oz (97.5 kg)      Other studies Reviewed: Additional studies/ records that were reviewed today include: EP device interrogation. Review of the above records demonstrates:  Please see elsewhere in the note.     ASSESSMENT AND PLAN:  Atrial fib I am contacting Dr. [name].  I think this gentleman needs rhythm control  as he seems to be symptomatic coincident with his fibrillation.  I would suggest first starting Tikosyn and I did discuss this with the patient.  He understands the need to be hospitalized.  He wants to think about this.  In the meantime he will continue anticoagulation.   Pacemaker placement He is up-to-date with follow-up.  Left common iliac anuerysm This was stable in October of last year and I will schedule him  to have a follow-up ultrasound in October of this year.   HTN Blood pressure is controlled today.  No change in therapy.  Septal hypertrophy There was no significant septal hypertrophy on his most recent echo.  No change in therapy.  Dyslipidemia He is being managed in the lipid clinic with a PCSK9 inhibitor.    Current medicines are reviewed at length with the patient today.  The patient does not have concerns regarding medicines.  The following changes have been made:  no change  Labs/ tests ordered today include: None  Orders Placed This Encounter  Procedures  . EKG 12-Lead     Disposition:   FU with me in about 3 months.  However, he will notify me if he agrees to start Tikosyn.   Signed, Minus Breeding, MD  01/29/2020 12:55 PM    [facility]

## 2020-01-29 ENCOUNTER — Telehealth: Payer: Self-pay

## 2020-01-29 ENCOUNTER — Other Ambulatory Visit: Payer: Self-pay

## 2020-01-29 ENCOUNTER — Telehealth: Payer: Self-pay | Admitting: Cardiology

## 2020-01-29 ENCOUNTER — Encounter: Payer: Self-pay | Admitting: Cardiology

## 2020-01-29 ENCOUNTER — Ambulatory Visit (INDEPENDENT_AMBULATORY_CARE_PROVIDER_SITE_OTHER): Payer: Medicare Other | Admitting: Cardiology

## 2020-01-29 VITALS — BP 128/74 | HR 85 | Ht 70.0 in | Wt 219.0 lb

## 2020-01-29 DIAGNOSIS — E782 Mixed hyperlipidemia: Secondary | ICD-10-CM

## 2020-01-29 DIAGNOSIS — Z950 Presence of cardiac pacemaker: Secondary | ICD-10-CM

## 2020-01-29 DIAGNOSIS — I723 Aneurysm of iliac artery: Secondary | ICD-10-CM | POA: Diagnosis not present

## 2020-01-29 DIAGNOSIS — E785 Hyperlipidemia, unspecified: Secondary | ICD-10-CM

## 2020-01-29 DIAGNOSIS — I10 Essential (primary) hypertension: Secondary | ICD-10-CM

## 2020-01-29 DIAGNOSIS — I480 Paroxysmal atrial fibrillation: Secondary | ICD-10-CM | POA: Diagnosis not present

## 2020-01-29 DIAGNOSIS — Z7189 Other specified counseling: Secondary | ICD-10-CM | POA: Diagnosis not present

## 2020-01-29 MED ORDER — PRALUENT 150 MG/ML ~~LOC~~ SOAJ: 150.0000 mg | SUBCUTANEOUS | 3 refills | Status: DC

## 2020-01-29 NOTE — Telephone Encounter (Signed)
-----   Message from [place], [facility] sent at 01/29/2020  8:09 AM EST ----- Regarding: FW: Praluent Please call patient.  Still using Praluent? Any missed doses? Due to repeat fasting blood work in March.  Raquel ----- Message ----- From: Harrington Challenger, RPH Sent: 01/29/2020 To: Roxanne Mins Rodriguez-Guzman, RPH Subject: Praluent                                       3 months lipids follow up.

## 2020-01-29 NOTE — Telephone Encounter (Signed)
New Message    Pt is calling about a procedure he had discussed with Dr [name]. He states he would like to do the procedure as soon as he is able. Within  The next week or so   Please call

## 2020-01-29 NOTE — Patient Instructions (Signed)
Medication Instructions:  No Changes *If you need a refill on your cardiac medications before your next appointment, please call your pharmacy*  Lab Work: None  Testing/Procedures: None  Follow-Up: At [facility], you and your health needs are our priority.  As part of our continuing mission to provide you with exceptional heart care, we have created designated Provider Care Teams.  These Care Teams include your primary Cardiologist (physician) and Advanced Practice Providers (APPs -  Physician Assistants and Nurse Practitioners) who all work together to provide you with the care you need, when you need it.  Your next appointment:   3 month(s)  The format for your next appointment:   In Person  Provider:   Minus Breeding, MD

## 2020-01-29 NOTE — Telephone Encounter (Signed)
Called the pt and got them a refill they stated that they have been taking the praluent. Lipid labs ordered and pt instructed to come fasting. Pt voiced understanding

## 2020-01-30 DIAGNOSIS — E782 Mixed hyperlipidemia: Secondary | ICD-10-CM | POA: Diagnosis not present

## 2020-01-31 LAB — LIPID PANEL
Chol/HDL Ratio: 3.6 ratio (ref 0.0–5.0)
Cholesterol, Total: 142 mg/dL (ref 100–199)
HDL: 40 mg/dL (ref >39)
LDL Chol Calc (NIH): 72 mg/dL (ref 0–99)
Triglycerides: 174 mg/dL — ABNORMAL HIGH (ref 0–149)
VLDL Cholesterol Cal: 30 mg/dL (ref 5–40)

## 2020-02-01 DIAGNOSIS — L4050 Arthropathic psoriasis, unspecified: Secondary | ICD-10-CM | POA: Diagnosis not present

## 2020-02-12 ENCOUNTER — Telehealth: Payer: Self-pay | Admitting: Pharmacist

## 2020-02-12 NOTE — Telephone Encounter (Signed)
Medication list reviewed in anticipation of upcoming Tikosyn initiation. Patient is not taking any contraindicated or QTc prolonging medications.   Patient is anticoagulated on Eliquis on the appropriate dose. Please ensure that patient has not missed any anticoagulation doses in the 3 weeks prior to Tikosyn initiation.

## 2020-02-13 ENCOUNTER — Other Ambulatory Visit (HOSPITAL_COMMUNITY): Payer: Self-pay | Admitting: *Deleted

## 2020-02-13 NOTE — Telephone Encounter (Signed)
Patient reported he is also on Stelara (currently starter dosing will eventually be every 3 months). Wanted to ensure this does not interfere with [employer]

## 2020-02-13 NOTE — Telephone Encounter (Signed)
No med interaction with Tikosyn - pt is ok to take Stelara.

## 2020-02-28 ENCOUNTER — Other Ambulatory Visit: Payer: Self-pay

## 2020-02-29 ENCOUNTER — Telehealth: Payer: Self-pay | Admitting: Internal Medicine

## 2020-02-29 ENCOUNTER — Other Ambulatory Visit: Payer: Self-pay

## 2020-02-29 ENCOUNTER — Encounter: Payer: Self-pay | Admitting: Internal Medicine

## 2020-02-29 ENCOUNTER — Ambulatory Visit (INDEPENDENT_AMBULATORY_CARE_PROVIDER_SITE_OTHER): Payer: Medicare Other | Admitting: Internal Medicine

## 2020-02-29 VITALS — BP 149/73 | HR 67 | Temp 99.2°F | Resp 18 | Ht 70.0 in | Wt 222.0 lb

## 2020-02-29 DIAGNOSIS — I10 Essential (primary) hypertension: Secondary | ICD-10-CM

## 2020-02-29 DIAGNOSIS — E538 Deficiency of other specified B group vitamins: Secondary | ICD-10-CM | POA: Diagnosis not present

## 2020-02-29 DIAGNOSIS — I480 Paroxysmal atrial fibrillation: Secondary | ICD-10-CM

## 2020-02-29 DIAGNOSIS — L4050 Arthropathic psoriasis, unspecified: Secondary | ICD-10-CM

## 2020-02-29 DIAGNOSIS — Z79899 Other long term (current) drug therapy: Secondary | ICD-10-CM | POA: Diagnosis not present

## 2020-02-29 LAB — VITAMIN B12: Vitamin B-12: 1108 pg/mL — ABNORMAL HIGH (ref 211–911)

## 2020-02-29 NOTE — Progress Notes (Signed)
Pre visit review using our clinic review tool, if applicable. No additional management support is needed unless otherwise documented below in the visit note. 

## 2020-02-29 NOTE — Patient Instructions (Addendum)
Please schedule Medicare Wellness with Glenard Haring.    GO TO THE LAB : Get the blood work     [place] [place], please reschedule your appointments Come back for for a checkup in 6 months

## 2020-02-29 NOTE — Telephone Encounter (Signed)
Patient states he is taking OTC B12,5000MG .  Patient has blood work today for B12.

## 2020-02-29 NOTE — Assessment & Plan Note (Signed)
HTN: BP slightly elevated today, continue losartan. Hypothyroidism: Last TSH satisfactory B12 deficiency: Diagnosed at the last office visit, on oral supplements, subjectively feels better. Checking levels. Fatigue: See last visit, overall feeling better since he is started B12 supplements Paroxysmal atrial fibrillation, saw cardiology 01/29/2020, he was felt to be having symptoms from A. fib, they discussed Tikosyn, it is to be started in the hospital soon Headaches: See last visit, sed rate was negative, still having headaches every other day, for now declined a neurology referral. Osteoporosis: Again he is not clear when exactly he started Fosamax, 3 or 4 years?.  We agreed to start a holiday by mid 2022. Vitamin D supplements: Last levels were in the high side of normal.  He was taking 7000 units daily now is taking 5000 units daily.  No change Psoriatic arthritis: Per rheumatology, on Stelara RTC 6 months

## 2020-02-29 NOTE — Progress Notes (Signed)
Subjective:    Patient ID: Martin Rubio, male    DOB: [birth year], 74 y.o.   MRN: [id number]  DOS:  02/29/2020 Type of visit - description: Follow-up Follow-up from recent visit Saw cardiology, notes reviewed.  BP Readings from Last 3 Encounters:  02/29/20 (!) 149/73  01/29/20 128/74  01/09/20 (!) 148/76     Review of Systems Still having headaches every other day Still having episodes of atrial fibrillation on and off, less noticeable since he started B12 supplements according to the patient.   Past Medical History:  Diagnosis Date  . Bronchitis   . Diverticulosis   . FH: BPH (benign prostatic hypertrophy)   . History of Graves' disease 01/19/2018   S/p ablation  . HTN (hypertension)   . Hyperlipidemia   . Obesity   . Psoriatic arthritis ([place])   . Rheumatoid arthritis ([place]) 08/07/2014  . Sleep apnea    CPAP  . Thyroid disease     Past Surgical History:  Procedure Laterality Date  . INGUINAL HERNIA REPAIR     bilateral  . KNEE SURGERY     bilateral arthroscopic  . PACEMAKER IMPLANT N/A 12/13/2019   Procedure: PACEMAKER IMPLANT;  Surgeon: Evans Lance, MD;  Location: [facility];  Service: Cardiovascular;  Laterality: N/A;  . TRANSURETHRAL RESECTION OF PROSTATE    . UMBILICAL HERNIA REPAIR      Allergies as of 02/29/2020      Reactions   Codeine    Tension, nasty feeling   Nsaids Other (See Comments)   Renal insufficiency   Prednisone Other (See Comments)   Unknown/ sometimes takes with lower dose   Statins Other (See Comments)   Joint pain   Sulfa Antibiotics Other (See Comments)   Joint pain      Medication List       Accurate as of February 29, 2020 10:02 AM. If you have any questions, ask your nurse or doctor.        acetaminophen 325 MG tablet Commonly known as: TYLENOL Take 650 mg by mouth every 6 (six) hours as needed for mild pain.   alendronate 70 MG tablet Commonly known as: FOSAMAX Take 1 tablet (70 mg total) by mouth once a  week. Take with a full glass of water on an empty stomach. What changed: additional instructions   apixaban 5 MG Tabs tablet Commonly known as: Eliquis Take 1 tablet (5 mg total) by mouth 2 (two) times daily.   fluticasone 50 MCG/ACT nasal spray Commonly known as: FLONASE Place 1 spray into both nostrils as needed for allergies.   levothyroxine 125 MCG tablet Commonly known as: SYNTHROID TAKE 1 TABLET (125 MCG TOTAL) BY MOUTH DAILY ON AN EMPTY STOMACH What changed: See the new instructions.   losartan 50 MG tablet Commonly known as: COZAAR Take 1 tablet (50 mg total) by mouth 2 (two) times daily.   meclizine 25 MG tablet Commonly known as: ANTIVERT Take 25 mg by mouth 3 (three) times daily as needed for dizziness.   melatonin 5 MG Tabs Take 5 mg by mouth at bedtime.   pantoprazole 40 MG tablet Commonly known as: PROTONIX Take 40 mg by mouth daily.   Praluent 150 MG/ML Soaj Generic drug: Alirocumab Inject 150 mg into the skin every 14 (fourteen) days.   sildenafil 20 MG tablet Commonly known as: REVATIO Take 60-100 mg by mouth daily as needed for erectile dysfunction.   tamsulosin 0.4 MG Caps capsule Commonly known as: FLOMAX  Take 0.4 mg by mouth at bedtime.   triamcinolone cream 0.1 % Commonly known as: KENALOG Apply 1 application topically as needed for itching.   VITAMIN C ADULT GUMMIES PO Take by mouth. 2 gummies daily   Vitamin D 125 MCG (5000 UT) Caps Take 7,000 Units by mouth daily.   zinc gluconate 50 MG tablet Take 50 mg by mouth at bedtime.          Objective:   Physical Exam BP (!) 149/73 (BP Location: Left Arm, Patient Position: Sitting, Cuff Size: Normal)   Pulse 67   Temp 99.2 F (37.3 C) (Temporal)   Resp 18   Ht 5\' 10"  (1.778 m)   Wt 222 lb (100.7 kg)   SpO2 98%   BMI 31.85 kg/m  General:   Well developed, NAD, BMI noted. HEENT:  Normocephalic . Face symmetric, atraumatic Lungs:  CTA B Normal respiratory effort, no  intercostal retractions, no accessory muscle use. Heart: RRR,  no murmur.  Lower extremities: no pretibial edema bilaterally  Skin: Not pale. Not jaundice Neurologic:  alert & oriented X3.  Speech normal, gait appropriate for age and unassisted Psych--  Cognition and judgment appear intact.  Cooperative with normal attention span and concentration.  Behavior appropriate. No anxious or depressed appearing.      Assessment      ASSESSMENT  (transfer to me 12/2019) HTN High cholesterol, seen at the lipid clinic Hypothyroidism  Psoriatic Arthritis [name]  Osteoporosis: on fosamax rx by PCP, start holiday by mid 2022 (see visit note from 02/29/2020) CKD mild,stable . Creatinine ~1.5 CV: -Paroxysmal A. Fib, dx 09/2019 anticoagulated -Sick sinus syndrome, s/p pacemaker (12/13/2019) -Septal hypertrophy per echo -Left common iliac aneurysm, S/U cardiology OSA on CPAP BPH [name]  PLAN HTN: BP slightly elevated today, continue losartan. Hypothyroidism: Last TSH satisfactory B12 deficiency: Diagnosed at the last office visit, on oral supplements, subjectively feels better. Checking levels. Fatigue: See last visit, overall feeling better since he is started B12 supplements Paroxysmal atrial fibrillation, saw cardiology 01/29/2020, he was felt to be having symptoms from A. fib, they discussed Tikosyn, it is to be started in the hospital soon Headaches: See last visit, sed rate was negative, still having headaches every other day, for now declined a neurology referral. Osteoporosis: Again he is not clear when exactly he started Fosamax, 3 or 4 years?.  We agreed to start a holiday by mid 2022. Vitamin D supplements: Last levels were in the high side of normal.  He was taking 7000 units daily now is taking 5000 units daily.  No change Psoriatic arthritis: Per rheumatology, on Stelara RTC 6 months   This visit occurred during the SARS-CoV-2 public health emergency.  Safety protocols  were in place, including screening questions prior to the visit, additional usage of staff PPE, and extensive cleaning of exam room while observing appropriate contact time as indicated for disinfecting solutions.

## 2020-03-01 ENCOUNTER — Other Ambulatory Visit (HOSPITAL_COMMUNITY)
Admission: RE | Admit: 2020-03-01 | Discharge: 2020-03-01 | Disposition: A | Discharge status: Home / Self Care | Payer: Medicare Other | Source: Ambulatory Visit | Attending: Internal Medicine | Admitting: Internal Medicine

## 2020-03-01 DIAGNOSIS — Z01812 Encounter for preprocedural laboratory examination: Secondary | ICD-10-CM | POA: Diagnosis not present

## 2020-03-01 DIAGNOSIS — Z20822 Contact with and (suspected) exposure to covid-19: Secondary | ICD-10-CM | POA: Insufficient documentation

## 2020-03-01 LAB — SARS CORONAVIRUS 2 (TAT 6-24 HRS): SARS Coronavirus 2: NEGATIVE

## 2020-03-01 NOTE — Telephone Encounter (Signed)
Noted  

## 2020-03-05 ENCOUNTER — Inpatient Hospital Stay (HOSPITAL_COMMUNITY)
Admission: RE | Admit: 2020-03-05 | Discharge: 2020-03-08 | DRG: 309 | Disposition: A | Discharge status: Home / Self Care | Payer: Medicare Other | Source: Ambulatory Visit | Attending: Internal Medicine | Admitting: Internal Medicine

## 2020-03-05 ENCOUNTER — Other Ambulatory Visit: Payer: Self-pay

## 2020-03-05 ENCOUNTER — Encounter (HOSPITAL_COMMUNITY): Payer: Self-pay | Admitting: Internal Medicine

## 2020-03-05 ENCOUNTER — Ambulatory Visit (HOSPITAL_COMMUNITY)
Admission: RE | Admit: 2020-03-05 | Discharge: 2020-03-05 | Disposition: A | Discharge status: Home / Self Care | Payer: Medicare Other | Source: Ambulatory Visit | Attending: Physician Assistant | Admitting: Physician Assistant

## 2020-03-05 VITALS — BP 146/80 | HR 71 | Ht 70.0 in | Wt 221.2 lb

## 2020-03-05 DIAGNOSIS — Z20822 Contact with and (suspected) exposure to covid-19: Secondary | ICD-10-CM | POA: Diagnosis present

## 2020-03-05 DIAGNOSIS — N183 Chronic kidney disease, stage 3 unspecified: Secondary | ICD-10-CM | POA: Diagnosis not present

## 2020-03-05 DIAGNOSIS — G4733 Obstructive sleep apnea (adult) (pediatric): Secondary | ICD-10-CM | POA: Diagnosis not present

## 2020-03-05 DIAGNOSIS — E785 Hyperlipidemia, unspecified: Secondary | ICD-10-CM | POA: Diagnosis not present

## 2020-03-05 DIAGNOSIS — I4892 Unspecified atrial flutter: Secondary | ICD-10-CM | POA: Diagnosis not present

## 2020-03-05 DIAGNOSIS — Z886 Allergy status to analgesic agent status: Secondary | ICD-10-CM

## 2020-03-05 DIAGNOSIS — E039 Hypothyroidism, unspecified: Secondary | ICD-10-CM | POA: Diagnosis present

## 2020-03-05 DIAGNOSIS — Z79890 Hormone replacement therapy (postmenopausal): Secondary | ICD-10-CM

## 2020-03-05 DIAGNOSIS — Z8249 Family history of ischemic heart disease and other diseases of the circulatory system: Secondary | ICD-10-CM | POA: Diagnosis not present

## 2020-03-05 DIAGNOSIS — Z79899 Other long term (current) drug therapy: Secondary | ICD-10-CM

## 2020-03-05 DIAGNOSIS — D6869 Other thrombophilia: Secondary | ICD-10-CM | POA: Diagnosis not present

## 2020-03-05 DIAGNOSIS — I495 Sick sinus syndrome: Secondary | ICD-10-CM | POA: Diagnosis not present

## 2020-03-05 DIAGNOSIS — I480 Paroxysmal atrial fibrillation: Principal | ICD-10-CM | POA: Diagnosis present

## 2020-03-05 DIAGNOSIS — Z950 Presence of cardiac pacemaker: Secondary | ICD-10-CM | POA: Diagnosis not present

## 2020-03-05 DIAGNOSIS — I129 Hypertensive chronic kidney disease with stage 1 through stage 4 chronic kidney disease, or unspecified chronic kidney disease: Secondary | ICD-10-CM | POA: Diagnosis present

## 2020-03-05 DIAGNOSIS — Z7901 Long term (current) use of anticoagulants: Secondary | ICD-10-CM

## 2020-03-05 DIAGNOSIS — Z6831 Body mass index (BMI) 31.0-31.9, adult: Secondary | ICD-10-CM

## 2020-03-05 DIAGNOSIS — E669 Obesity, unspecified: Secondary | ICD-10-CM | POA: Diagnosis not present

## 2020-03-05 DIAGNOSIS — Z885 Allergy status to narcotic agent status: Secondary | ICD-10-CM

## 2020-03-05 DIAGNOSIS — Z882 Allergy status to sulfonamides status: Secondary | ICD-10-CM | POA: Diagnosis not present

## 2020-03-05 LAB — BASIC METABOLIC PANEL
Anion gap: 10 (ref 5–15)
BUN: 16 mg/dL (ref 8–23)
CO2: 24 mmol/L (ref 22–32)
Calcium: 9.5 mg/dL (ref 8.9–10.3)
Chloride: 107 mmol/L (ref 98–111)
Creatinine, Ser: 1.51 mg/dL — ABNORMAL HIGH (ref 0.61–1.24)
GFR calc Af Amer: 52 mL/min — ABNORMAL LOW (ref >60)
GFR calc non Af Amer: 45 mL/min — ABNORMAL LOW (ref >60)
Glucose, Bld: 101 mg/dL — ABNORMAL HIGH (ref 70–99)
Potassium: 4.1 mmol/L (ref 3.5–5.1)
Sodium: 141 mmol/L (ref 135–145)

## 2020-03-05 LAB — MAGNESIUM: Magnesium: 1.8 mg/dL (ref 1.7–2.4)

## 2020-03-05 MED ORDER — TAMSULOSIN HCL 0.4 MG PO CAPS
0.4000 mg | ORAL_CAPSULE | Freq: Every day | ORAL | Status: DC
  Administered 2020-03-05 – 2020-03-07 (×3): 0.4 mg via ORAL
  Filled 2020-03-05 (×3): qty 1

## 2020-03-05 MED ORDER — DOFETILIDE 250 MCG PO CAPS
250.0000 ug | ORAL_CAPSULE | Freq: Two times a day (BID) | ORAL | Status: DC
  Administered 2020-03-05 – 2020-03-08 (×6): 250 ug via ORAL
  Filled 2020-03-05 (×6): qty 1

## 2020-03-05 MED ORDER — PANTOPRAZOLE SODIUM 40 MG PO TBEC
40.0000 mg | DELAYED_RELEASE_TABLET | Freq: Every day | ORAL | Status: DC
  Administered 2020-03-06 – 2020-03-08 (×3): 40 mg via ORAL
  Filled 2020-03-05 (×3): qty 1

## 2020-03-05 MED ORDER — VITAMIN D 25 MCG (1000 UNIT) PO TABS
1000.0000 [IU] | ORAL_TABLET | Freq: Every day | ORAL | Status: DC
  Administered 2020-03-06 – 2020-03-08 (×3): 1000 [IU] via ORAL
  Filled 2020-03-05 (×3): qty 1

## 2020-03-05 MED ORDER — LOSARTAN POTASSIUM 50 MG PO TABS
50.0000 mg | ORAL_TABLET | Freq: Two times a day (BID) | ORAL | Status: DC
  Administered 2020-03-05 – 2020-03-07 (×2): 50 mg via ORAL
  Filled 2020-03-05 (×6): qty 1

## 2020-03-05 MED ORDER — SODIUM CHLORIDE 0.9 % IV SOLN: 250.0000 mL | INTRAVENOUS | Status: DC | PRN

## 2020-03-05 MED ORDER — SILDENAFIL CITRATE 20 MG PO TABS: 60.0000 mg | ORAL_TABLET | Freq: Every day | ORAL | Status: DC | PRN

## 2020-03-05 MED ORDER — FLUTICASONE PROPIONATE 50 MCG/ACT NA SUSP: 1.0000 | NASAL | Status: DC | PRN

## 2020-03-05 MED ORDER — ALENDRONATE SODIUM 70 MG PO TABS: 70.0000 mg | ORAL_TABLET | ORAL | Status: DC

## 2020-03-05 MED ORDER — ASCORBIC ACID 125 MG PO CHEW: CHEWABLE_TABLET | Freq: Every day | ORAL | Status: DC

## 2020-03-05 MED ORDER — SODIUM CHLORIDE 0.9% FLUSH
3.0000 mL | Freq: Two times a day (BID) | INTRAVENOUS | Status: DC
  Administered 2020-03-05 – 2020-03-07 (×3): 3 mL via INTRAVENOUS

## 2020-03-05 MED ORDER — SODIUM CHLORIDE 0.9% FLUSH: 3.0000 mL | INTRAVENOUS | Status: DC | PRN

## 2020-03-05 MED ORDER — ACETAMINOPHEN 325 MG PO TABS
650.0000 mg | ORAL_TABLET | Freq: Four times a day (QID) | ORAL | Status: DC | PRN
  Administered 2020-03-08: 05:00:00 650 mg via ORAL
  Filled 2020-03-05: qty 2

## 2020-03-05 MED ORDER — MAGNESIUM SULFATE 2 GM/50ML IV SOLN
2.0000 g | Freq: Once | INTRAVENOUS | Status: AC
  Administered 2020-03-05: 2 g via INTRAVENOUS
  Filled 2020-03-05: qty 50

## 2020-03-05 MED ORDER — ALIROCUMAB 150 MG/ML ~~LOC~~ SOAJ: 150.0000 mg | SUBCUTANEOUS | Status: DC

## 2020-03-05 MED ORDER — ZINC GLUCONATE 50 MG PO TABS: 50.0000 mg | ORAL_TABLET | Freq: Every day | ORAL | Status: DC

## 2020-03-05 MED ORDER — MECLIZINE HCL 25 MG PO TABS: 25.0000 mg | ORAL_TABLET | Freq: Three times a day (TID) | ORAL | Status: DC | PRN

## 2020-03-05 MED ORDER — LEVOTHYROXINE SODIUM 25 MCG PO TABS
125.0000 ug | ORAL_TABLET | Freq: Every day | ORAL | Status: DC
  Administered 2020-03-06 – 2020-03-08 (×3): 125 ug via ORAL
  Filled 2020-03-05 (×3): qty 1

## 2020-03-05 MED ORDER — MELATONIN 3 MG PO TABS
4.5000 mg | ORAL_TABLET | Freq: Every day | ORAL | Status: DC
  Administered 2020-03-05 – 2020-03-07 (×3): 4.5 mg via ORAL
  Filled 2020-03-05 (×4): qty 1.5

## 2020-03-05 MED ORDER — APIXABAN 5 MG PO TABS
5.0000 mg | ORAL_TABLET | Freq: Two times a day (BID) | ORAL | Status: DC
  Administered 2020-03-05 – 2020-03-08 (×6): 5 mg via ORAL
  Filled 2020-03-05 (×6): qty 1

## 2020-03-05 MED ORDER — DOFETILIDE 250 MCG PO CAPS: 250.0000 ug | ORAL_CAPSULE | Freq: Two times a day (BID) | ORAL | Status: DC

## 2020-03-05 MED ORDER — TRIAMCINOLONE ACETONIDE 0.1 % EX CREA: 1.0000 "application " | TOPICAL_CREAM | CUTANEOUS | Status: DC | PRN

## 2020-03-05 NOTE — TOC Benefit Eligibility Note (Signed)
Transition of Care [facility]) Benefit Eligibility Note    Patient Details  Name: Martin Rubio MRN: [id number] Date of Birth: [birth year]   Medication/Dose: DOFETILIDE  125 MCG BID , CO-PAY-$111.41   DOFETILIDE  250 MCG ,CO-PAY- $120.29 , DOFETILIDE  500 MCG BID , CO-PAY-$121.49  Covered?: Yes  Tier: (TIER- 4)  Prescription Coverage [facility]erred [facility]  Spoke with Person/Company/Phone Number:: [employer]  @ [employer] # [phone]  Co-Pay: $111.41  Prior Approval: No  Deductible: (NO DEDUCTIBLE   and   OUT-OF-POCKET : NOT MET)  Additional Notes: TIKOSYN 125 MCG, 1250 MCG BID , 500 MCG BID : NOT COVER/NON-FORMULARY , P/A YES # [id number], FAX # [phone]    Memory Argue Phone Number: 03/05/2020, 3:50 PM

## 2020-03-05 NOTE — H&P (Signed)
Primary Care Physician: Colon Branch, MD  Primary Cardiologist: Dr [name]  Primary Electrophysiologist: Dr [name]  Referring Physician: Dr [name] is a 74 y.o. male with a history of HTN, paroxysmal atrial fibrillation, atrial flutter, OSA, HLD, Grave's disease, sinus node dysfunction s/p PPM who presents for follow up in the [facility]. Patient is on Eliquis for a CHADS2VASC score of 3. Patient underwent PPM implant on 12/13/19 for his sinus node dysfunction. Patient has had episodes of afib lasting 2-3 hours with symptoms of fatigue and dyspnea with exertion. He was seen by Dr [name] on 01/29/20 who recommended dofetilide. He continues to have paroxysms of afib, confirmed on Kardia device. He denies any missed doses of anticoagulation in the last 3 weeks.  Today, he denies symptoms of chest pain, orthopnea, PND, lower extremity edema, dizziness, presyncope, syncope, snoring, daytime somnolence, bleeding, or neurologic sequela. The patient is tolerating medications without difficulties and is otherwise without complaint today.  Atrial Fibrillation Risk Factors:  he does have symptoms or diagnosis of sleep apnea.  he is compliant with CPAP therapy.  he does not have a history of rheumatic fever.  he does have a history of alcohol use.  he has a BMI of Body mass index is 31.74 kg/m.Marland Kitchen     Filed Weights   03/05/20 1103  Weight: 100.3 kg        Family History  Problem Relation Age of Onset  . CAD Father 45   Died age 38  . CAD Mother 14   CABG  . Diabetes Brother    Atrial Fibrillation Management history:  Previous antiarrhythmic drugs: none  Previous cardioversions: none  Previous ablations: none  CHADS2VASC score: 3  Anticoagulation history: Eliquis      Past Medical History:  Diagnosis Date  . Bronchitis   . Diverticulosis   . FH: BPH (benign prostatic hypertrophy)   . History of Graves' disease 01/19/2018   S/p ablation  . HTN  (hypertension)   . Hyperlipidemia   . Obesity   . Psoriatic arthritis ([place])   . Rheumatoid arthritis ([place]) 08/07/2014  . Sleep apnea    CPAP  . Thyroid disease         Past Surgical History:  Procedure Laterality Date  . INGUINAL HERNIA REPAIR     bilateral  . KNEE SURGERY     bilateral arthroscopic  . PACEMAKER IMPLANT N/A 12/13/2019   Procedure: PACEMAKER IMPLANT; Surgeon: Evans Lance, MD; Location: [facility]; Service: Cardiovascular; Laterality: N/A;  . TRANSURETHRAL RESECTION OF PROSTATE    . UMBILICAL HERNIA REPAIR           Current Outpatient Medications  Medication Sig Dispense Refill  . acetaminophen (TYLENOL) 325 MG tablet Take 650 mg by mouth every 6 (six) hours as needed for mild pain.    Marland Kitchen alendronate (FOSAMAX) 70 MG tablet Take 1 tablet (70 mg total) by mouth once a week. Take with a full glass of water on an empty stomach. (Patient taking differently: Take 70 mg by mouth once a week. Take with a full glass of water on an empty stomach.  Wednesday) 26 tablet 1  . Alirocumab (PRALUENT) 150 MG/ML SOAJ Inject 150 mg into the skin every 14 (fourteen) days. 6 pen 3  . apixaban (ELIQUIS) 5 MG TABS tablet Take 1 tablet (5 mg total) by mouth 2 (two) times daily. 180 tablet 3  . Ascorbic Acid (VITAMIN C ADULT GUMMIES PO)  Take by mouth. 2 gummies daily    . Cholecalciferol (VITAMIN D) 125 MCG (5000 UT) CAPS Take 5,000 Units by mouth daily.     . Cyanocobalamin (VITAMIN B 12) 500 MCG TABS Take 1,000 mg by mouth daily.     . fluticasone (FLONASE) 50 MCG/ACT nasal spray Place 1 spray into both nostrils as needed for allergies.     Marland Kitchen levothyroxine (SYNTHROID) 125 MCG tablet TAKE 1 TABLET (125 MCG TOTAL) BY MOUTH DAILY ON AN EMPTY STOMACH (Patient taking differently: Take 125 mcg by mouth daily before breakfast. ) 90 tablet 1  . losartan (COZAAR) 50 MG tablet Take 1 tablet (50 mg total) by mouth 2 (two) times daily. 180 tablet 1  . meclizine (ANTIVERT) 25 MG tablet Take 25  mg by mouth 3 (three) times daily as needed for dizziness.    . Melatonin 5 MG TABS Take 5 mg by mouth at bedtime.    . pantoprazole (PROTONIX) 40 MG tablet Take 40 mg by mouth daily.    . sildenafil (REVATIO) 20 MG tablet Take 60-100 mg by mouth daily as needed for erectile dysfunction.    . tamsulosin (FLOMAX) 0.4 MG CAPS capsule Take 0.4 mg by mouth at bedtime.     . triamcinolone cream (KENALOG) 0.1 % Apply 1 application topically as needed for itching.    Marland Kitchen Ustekinumab (STELARA [place]) Inject into the skin. Per Rheumatology -every 3 months    . zinc gluconate 50 MG tablet Take 50 mg by mouth at bedtime.     No current facility-administered medications for this encounter.        Allergies  Allergen Reactions  . Codeine     Tension, nasty feeling   . Nsaids Other (See Comments)    Renal insufficiency  . Prednisone Other (See Comments)    Unknown/ sometimes takes with lower dose  . Statins Other (See Comments)    Joint pain   . Sulfa Antibiotics Other (See Comments)    Joint pain   Social History        Socioeconomic History  . Marital status: Married    Spouse name: Not on file  . Number of children: 2  . Years of education: Not on file  . Highest education level: Not on file  Occupational History  . Occupation: Retired    Fish farm manager: [employer]  Tobacco Use  . Smoking status: Former Smoker    Types: Cigarettes  . Smokeless tobacco: Never Used  . Tobacco comment: Quit 30 years ago.  Substance and Sexual Activity  . Alcohol use: No  . Drug use: No  . Sexual activity: Not on file  Other Topics Concern  . Not on file  Social History Narrative   Lives with wife.   Social Determinants of Health      Financial Resource Strain:   . Difficulty of Paying Living Expenses:   Food Insecurity:   . Worried About Charity fundraiser in the Last Year:   . Arboriculturist in the Last Year:   Transportation Needs:   . Film/video editor (Medical):   Marland Kitchen Lack of  Transportation (Non-Medical):   Physical Activity:   . Days of Exercise per Week:   . Minutes of Exercise per Session:   Stress:   . Feeling of Stress :   Social Connections:   . Frequency of Communication with Friends and Family:   . Frequency of Social Gatherings with Friends and Family:   .  Attends Religious Services:   . Active Member of Clubs or Organizations:   . Attends Archivist Meetings:   Marland Kitchen Marital Status:   Intimate Partner Violence:   . Fear of Current or Ex-Partner:   . Emotionally Abused:   Marland Kitchen Physically Abused:   . Sexually Abused:    ROS- All systems are reviewed and negative except as per the HPI above.  Physical Exam:     Vitals:   03/05/20 1103  BP: (!) 146/80  Pulse: 71  Weight: 100.3 kg  Height: 5\' 10"  (1.778 m)   GEN- The patient is well appearing obese elderly male, alert and oriented x 3 today.  Head- normocephalic, atraumatic  Eyes- Sclera clear, conjunctiva pink  Ears- hearing intact  Oropharynx- clear  Neck- supple  Lungs- Clear to ausculation bilaterally, normal work of breathing  Heart- Regular rate and rhythm, no murmurs, rubs or gallops  GI- soft, NT, ND, + BS  Extremities- no clubbing, cyanosis, or edema  MS- no significant deformity or atrophy  Skin- no rash or lesion  Psych- euthymic mood, full affect  Neuro- strength and sensation are intact     Wt Readings from Last 3 Encounters:  03/05/20 100.3 kg  02/29/20 100.7 kg  01/29/20 99.3 kg   EKG today demonstrates AS VP rhythm HR 71, PR 196, QRS 122, QTc 443  Echo 12/16/19 demonstrated  1. Left ventricular ejection fraction, by visual estimation, is 55 to  60%. The left ventricle has normal function. There is no increased left  ventricular wall thickness.  2. Left ventricular diastolic parameters are consistent with Grade I  diastolic dysfunction (impaired relaxation).  3. The left ventricle has no regional wall motion abnormalities.  4. Global right ventricle has normal  systolc function.The right  ventricular size is normal. no increase in right ventricular wall  thickness.  5. The mitral valve is normal in structure. No evidence of mitral valve  regurgitation. No evidence of mitral stenosis.  6. The tricuspid valve was normal in structure. Tricuspid valve  regurgitation is not demonstrated.  7. Tricuspid valve regurgitation is not demonstrated.  8. Mild aortic valve sclerosis without stenosis.  9. The pulmonic valve was normal in structure.  10. The inferior vena cava is normal in size with greater than 50%  respiratory variability, suggesting right atrial pressure of 3 mmHg.  Epic records are reviewed at length today  CHA2DS2-VASc Score = 3  The patient's score is based upon:  CHF History: No  HTN History: Yes  Age : 22 +  Diabetes History: No  Stroke History: No  Vascular Disease History: No  Gender: Male  ASSESSMENT AND PLAN:  1. Paroxysmal Atrial Fibrillation/atrial flutter  The patient's CHA2DS2-VASc score is 3, indicating a 3.2% annual risk of stroke.  Patient wants to pursue dofetilide, aware of risk vrs benefit  Aware of price of dofetilide.  Patient will continue on Eliquis 5 mg BID, states no missed doses  No benadryl use  PharmD has screened drugs and no QT prolonging drugs on board  QTc in SR 443 ms with pacing.  Labs today show creatinine at 1.51, K+ 4.1 and mag 1.8, CrCl calculated at 60 mL/min  2. Secondary Hypercoagulable State (ICD10: D68.69)  The patient is at significant risk for stroke/thromboembolism based upon his CHA2DS2-VASc Score of 3. Continue Apixaban (Eliquis).  3. Obesity  Body mass index is 31.74 kg/m.  Lifestyle modification was discussed at length including regular exercise and weight reduction.  Patient willing  to increase activity and reduce junk foods.  4. Obstructive sleep apnea  The importance of adequate treatment of sleep apnea was discussed today in order to improve our ability to maintain sinus rhythm  long term.  Patient reports compliance with CPAP therapy.  5. HTN  Stable, no changes today.  6. Sinus node dysfunction  S/p PPM, followed by Dr [name] and the device clinic.  To be admitted later today once bed becomes available.  [facility]  [address]  [place], [place] [address]  [phone]  03/05/2020  12:46 PM  EP Attending  Patient seen and examined. Agree with the findings as noted above. The patient is stable and is admitted for initiation of dofetilide therapy due to atrial fib. I have reviewed the findings with the patient and he is willing to proceed with initiation of dofetilide.   Mikle Bosworth.D

## 2020-03-05 NOTE — Progress Notes (Addendum)
EKG 2 hr post Tikosyn dose w/Qtc 456. Will continue to monitor. Jessie Foot, RN

## 2020-03-05 NOTE — Progress Notes (Signed)
Pharmacy: Dofetilide (Tikosyn) - Initial Consult Assessment and Electrolyte Replacement  Pharmacy consulted to assist in monitoring and replacing electrolytes in this 74 y.o. male admitted on 03/05/2020 undergoing dofetilide initiation. First dofetilide dose: 03/05/20  Assessment:  Patient Exclusion Criteria: If any screening criteria checked as "Yes", then  patient  should NOT receive dofetilide until criteria item is corrected.  If "Yes" please indicate correction plan.  YES  NO Patient  Exclusion Criteria Correction Plan   [x]   []   Baseline QTc interval is greater than or equal to 440 msec. IF above YES box checked dofetilide contraindicated unless patient has ICD; then may proceed if QTc 500-550 msec or with known ventricular conduction abnormalities may proceed with QTc 550-600 msec. QTc = 452 EP aware   []   [x]   Patient is known or suspected to have a digoxin level greater than 2 ng/ml: No results found for: DIGOXIN     []   [x]   Creatinine clearance less than 20 ml/min (calculated using Cockcroft-Gault, actual body weight and serum creatinine): Estimated Creatinine Clearance: 50.1 mL/min (A) (by C-G formula based on SCr of 1.51 mg/dL (H)).     []   [x]  Patient has received drugs known to prolong the QT intervals within the last 48 hours (phenothiazines, tricyclics or tetracyclic antidepressants, erythromycin, H-1 antihistamines, cisapride, fluoroquinolones, azithromycin). Updated information on QT prolonging agents is available to be searched on the following database:QT prolonging agents     []   [x]   Patient received a dose of hydrochlorothiazide (Oretic) alone or in any combination including triamterene (Dyazide, Maxzide) in the last 48 hours.    []   [x]  Patient received a medication known to increase dofetilide plasma concentrations prior to initial dofetilide dose:  . Trimethoprim (Primsol, Proloprim) in the last 36 hours . Verapamil (Calan, Verelan) in the last 36  hours or a sustained release dose in the last 72 hours . Megestrol (Megace) in the last 5 days  . Cimetidine (Tagamet) in the last 6 hours . Ketoconazole (Nizoral) in the last 24 hours . Itraconazole (Sporanox) in the last 48 hours  . Prochlorperazine (Compazine) in the last 36 hours     []   [x]   Patient is known to have a history of torsades de pointes; congenital or acquired long QT syndromes.    []   [x]   Patient has received a Class 1 antiarrhythmic with less than 2 half-lives since last dose. (Disopyramide, Quinidine, Procainamide, Lidocaine, Mexiletine, Flecainide, Propafenone)    []   [x]   Patient has received amiodarone therapy in the past 3 months or amiodarone level is greater than 0.3 ng/ml.    Patient has been appropriately anticoagulated with apixaban 5mg  BID.  Labs:    Component Value Date/Time   K 4.1 03/05/2020 1130   MG 1.8 03/05/2020 1130     Plan: Potassium: K >/= 4: Appropriate to initiate Tikosyn, no replacement needed    Magnesium: Mg 1.8-2: Give Mg 2 gm IV x1 to prevent Mg from dropping below 1.8 - do not need to recheck Mg. Appropriate to initiate Tikosyn   Thank you for allowing pharmacy to participate in this patient's care   Arrie Senate, PharmD, BCPS Clinical Pharmacist [phone] Please check AMION for all [place] numbers 03/05/2020

## 2020-03-05 NOTE — Progress Notes (Signed)
Primary Care Physician: Colon Branch, MD Primary Cardiologist: Dr [name]  Primary Electrophysiologist: Dr [name] Referring Physician: Dr [name] is a 74 y.o. male with a history of HTN, paroxysmal atrial fibrillation, atrial flutter, OSA, HLD, Grave's disease, sinus node dysfunction s/p PPM who presents for follow up in the [facility]. Patient is on Eliquis for a CHADS2VASC score of 3. Patient underwent PPM implant on 12/13/19 for his sinus node dysfunction. Patient has had episodes of afib lasting 2-3 hours with symptoms of fatigue and dyspnea with exertion. He was seen by Dr [name] on 01/29/20 who recommended dofetilide. He continues to have paroxysms of afib, confirmed on Kardia device. He denies any missed doses of anticoagulation in the last 3 weeks.  Today, he denies symptoms of chest pain, orthopnea, PND, lower extremity edema, dizziness, presyncope, syncope, snoring, daytime somnolence, bleeding, or neurologic sequela. The patient is tolerating medications without difficulties and is otherwise without complaint today.    Atrial Fibrillation Risk Factors:  he does have symptoms or diagnosis of sleep apnea. he is compliant with CPAP therapy. he does not have a history of rheumatic fever. he does have a history of alcohol use.   he has a BMI of Body mass index is 31.74 kg/m.Marland Kitchen Filed Weights   03/05/20 1103  Weight: 100.3 kg    Family History  Problem Relation Age of Onset  . CAD Father 54       Died age 49  . CAD Mother 1       CABG  . Diabetes Brother      Atrial Fibrillation Management history:  Previous antiarrhythmic drugs: none Previous cardioversions: none Previous ablations: none CHADS2VASC score: 3 Anticoagulation history: Eliquis   Past Medical History:  Diagnosis Date  . Bronchitis   . Diverticulosis   . FH: BPH (benign prostatic hypertrophy)   . History of Graves' disease 01/19/2018   S/p ablation    . HTN (hypertension)   . Hyperlipidemia   . Obesity   . Psoriatic arthritis ([place])   . Rheumatoid arthritis ([place]) 08/07/2014  . Sleep apnea    CPAP  . Thyroid disease    Past Surgical History:  Procedure Laterality Date  . INGUINAL HERNIA REPAIR     bilateral  . KNEE SURGERY     bilateral arthroscopic  . PACEMAKER IMPLANT N/A 12/13/2019   Procedure: PACEMAKER IMPLANT;  Surgeon: Evans Lance, MD;  Location: [facility];  Service: Cardiovascular;  Laterality: N/A;  . TRANSURETHRAL RESECTION OF PROSTATE    . UMBILICAL HERNIA REPAIR      Current Outpatient Medications  Medication Sig Dispense Refill  . acetaminophen (TYLENOL) 325 MG tablet Take 650 mg by mouth every 6 (six) hours as needed for mild pain.    Marland Kitchen alendronate (FOSAMAX) 70 MG tablet Take 1 tablet (70 mg total) by mouth once a week. Take with a full glass of water on an empty stomach. (Patient taking differently: Take 70 mg by mouth once a week. Take with a full glass of water on an empty stomach.  Wednesday) 26 tablet 1  . Alirocumab (PRALUENT) 150 MG/ML SOAJ Inject 150 mg into the skin every 14 (fourteen) days. 6 pen 3  . apixaban (ELIQUIS) 5 MG TABS tablet Take 1 tablet (5 mg total) by mouth 2 (two) times daily. 180 tablet 3  . Ascorbic Acid (VITAMIN C ADULT GUMMIES PO) Take by mouth. 2 gummies daily    .  Cholecalciferol (VITAMIN D) 125 MCG (5000 UT) CAPS Take 5,000 Units by mouth daily.     . Cyanocobalamin (VITAMIN B 12) 500 MCG TABS Take 1,000 mg by mouth daily.     . fluticasone (FLONASE) 50 MCG/ACT nasal spray Place 1 spray into both nostrils as needed for allergies.     Marland Kitchen levothyroxine (SYNTHROID) 125 MCG tablet TAKE 1 TABLET (125 MCG TOTAL) BY MOUTH DAILY ON AN EMPTY STOMACH (Patient taking differently: Take 125 mcg by mouth daily before breakfast. ) 90 tablet 1  . losartan (COZAAR) 50 MG tablet Take 1 tablet (50 mg total) by mouth 2 (two) times daily. 180 tablet 1  . meclizine (ANTIVERT) 25 MG tablet Take 25  mg by mouth 3 (three) times daily as needed for dizziness.    . Melatonin 5 MG TABS Take 5 mg by mouth at bedtime.    . pantoprazole (PROTONIX) 40 MG tablet Take 40 mg by mouth daily.    . sildenafil (REVATIO) 20 MG tablet Take 60-100 mg by mouth daily as needed for erectile dysfunction.    . tamsulosin (FLOMAX) 0.4 MG CAPS capsule Take 0.4 mg by mouth at bedtime.     . triamcinolone cream (KENALOG) 0.1 % Apply 1 application topically as needed for itching.    Marland Kitchen Ustekinumab (STELARA [place]) Inject into the skin. Per Rheumatology -every 3 months    . zinc gluconate 50 MG tablet Take 50 mg by mouth at bedtime.     No current facility-administered medications for this encounter.    Allergies  Allergen Reactions  . Codeine     Tension, nasty feeling   . Nsaids Other (See Comments)    Renal insufficiency  . Prednisone Other (See Comments)    Unknown/ sometimes takes with lower dose  . Statins Other (See Comments)    Joint pain   . Sulfa Antibiotics Other (See Comments)    Joint pain    Social History   Socioeconomic History  . Marital status: Married    Spouse name: Not on file  . Number of children: 2  . Years of education: Not on file  . Highest education level: Not on file  Occupational History  . Occupation: Retired    Fish farm manager: [employer]  Tobacco Use  . Smoking status: Former Smoker    Types: Cigarettes  . Smokeless tobacco: Never Used  . Tobacco comment: Quit 30 years ago.  Substance and Sexual Activity  . Alcohol use: No  . Drug use: No  . Sexual activity: Not on file  Other Topics Concern  . Not on file  Social History Narrative   Lives with wife.   Social Determinants of Health   Financial Resource Strain:   . Difficulty of Paying Living Expenses:   Food Insecurity:   . Worried About Charity fundraiser in the Last Year:   . Arboriculturist in the Last Year:   Transportation Needs:   . Film/video editor (Medical):   Marland Kitchen Lack of Transportation  (Non-Medical):   Physical Activity:   . Days of Exercise per Week:   . Minutes of Exercise per Session:   Stress:   . Feeling of Stress :   Social Connections:   . Frequency of Communication with Friends and Family:   . Frequency of Social Gatherings with Friends and Family:   . Attends Religious Services:   . Active Member of Clubs or Organizations:   . Attends Archivist Meetings:   .  Marital Status:   Intimate Partner Violence:   . Fear of Current or Ex-Partner:   . Emotionally Abused:   Marland Kitchen Physically Abused:   . Sexually Abused:      ROS- All systems are reviewed and negative except as per the HPI above.  Physical Exam: Vitals:   03/05/20 1103  BP: (!) 146/80  Pulse: 71  Weight: 100.3 kg  Height: 5\' 10"  (1.778 m)    GEN- The patient is well appearing obese elderly male, alert and oriented x 3 today.   Head- normocephalic, atraumatic Eyes-  Sclera clear, conjunctiva pink Ears- hearing intact Oropharynx- clear Neck- supple  Lungs- Clear to ausculation bilaterally, normal work of breathing Heart- Regular rate and rhythm, no murmurs, rubs or gallops  GI- soft, NT, ND, + BS Extremities- no clubbing, cyanosis, or edema MS- no significant deformity or atrophy Skin- no rash or lesion Psych- euthymic mood, full affect Neuro- strength and sensation are intact  Wt Readings from Last 3 Encounters:  03/05/20 100.3 kg  02/29/20 100.7 kg  01/29/20 99.3 kg    EKG today demonstrates AS VP rhythm HR 71, PR 196, QRS 122, QTc 443  Echo 12/16/19 demonstrated  1. Left ventricular ejection fraction, by visual estimation, is 55 to  60%. The left ventricle has normal function. There is no increased left  ventricular wall thickness.  2. Left ventricular diastolic parameters are consistent with Grade I  diastolic dysfunction (impaired relaxation).  3. The left ventricle has no regional wall motion abnormalities.  4. Global right ventricle has normal systolc  function.The right  ventricular size is normal. no increase in right ventricular wall  thickness.  5. The mitral valve is normal in structure. No evidence of mitral valve  regurgitation. No evidence of mitral stenosis.  6. The tricuspid valve was normal in structure. Tricuspid valve  regurgitation is not demonstrated.  7. Tricuspid valve regurgitation is not demonstrated.  8. Mild aortic valve sclerosis without stenosis.  9. The pulmonic valve was normal in structure.  10. The inferior vena cava is normal in size with greater than 50%  respiratory variability, suggesting right atrial pressure of 3 mmHg.   Epic records are reviewed at length today  CHA2DS2-VASc Score = 3 The patient's score is based upon: CHF History: No HTN History: Yes Age : 34 + Diabetes History: No Stroke History: No Vascular Disease History: No Gender: Male      ASSESSMENT AND PLAN: 1. Paroxysmal Atrial Fibrillation/atrial flutter The patient's CHA2DS2-VASc score is 3, indicating a 3.2% annual risk of stroke.   Patient wants to pursue dofetilide, aware of risk vrs benefit Aware of price of dofetilide. Patient will continue on Eliquis 5 mg BID, states no missed doses No benadryl use PharmD has screened drugs and no QT prolonging drugs on board QTc in SR 443 ms with pacing. Labs today show creatinine at 1.51, K+ 4.1 and mag 1.8, CrCl calculated at 60 mL/min  2. Secondary Hypercoagulable State (ICD10:  D68.69) The patient is at significant risk for stroke/thromboembolism based upon his CHA2DS2-VASc Score of 3.  Continue Apixaban (Eliquis).   3. Obesity Body mass index is 31.74 kg/m. Lifestyle modification was discussed at length including regular exercise and weight reduction. Patient willing to increase activity and reduce junk foods.  4. Obstructive sleep apnea The importance of adequate treatment of sleep apnea was discussed today in order to improve our ability to maintain sinus rhythm long  term. Patient reports compliance with CPAP therapy.  5. HTN  Stable, no changes today.  6. Sinus node dysfunction S/p PPM, followed by Dr [name] and the device clinic.   To be admitted later today once bed becomes available.    [facility] [address] [place], [place] [address] [phone] 03/05/2020 12:46 PM

## 2020-03-06 ENCOUNTER — Encounter (HOSPITAL_COMMUNITY): Payer: Self-pay

## 2020-03-06 DIAGNOSIS — I480 Paroxysmal atrial fibrillation: Principal | ICD-10-CM

## 2020-03-06 LAB — BASIC METABOLIC PANEL
Anion gap: 8 (ref 5–15)
BUN: 17 mg/dL (ref 8–23)
CO2: 26 mmol/L (ref 22–32)
Calcium: 9.2 mg/dL (ref 8.9–10.3)
Chloride: 104 mmol/L (ref 98–111)
Creatinine, Ser: 1.76 mg/dL — ABNORMAL HIGH (ref 0.61–1.24)
GFR calc Af Amer: 43 mL/min — ABNORMAL LOW (ref >60)
GFR calc non Af Amer: 37 mL/min — ABNORMAL LOW (ref >60)
Glucose, Bld: 96 mg/dL (ref 70–99)
Potassium: 4.3 mmol/L (ref 3.5–5.1)
Sodium: 138 mmol/L (ref 135–145)

## 2020-03-06 LAB — MAGNESIUM: Magnesium: 2.2 mg/dL (ref 1.7–2.4)

## 2020-03-06 NOTE — Progress Notes (Signed)
Pharmacy: Dofetilide (Tikosyn) - Follow Up Assessment and Electrolyte Replacement  Pharmacy consulted to assist in monitoring and replacing electrolytes in this 74 y.o. male admitted on 03/05/2020 undergoing dofetilide initiation. First dofetilide dose: 03/05/30  Labs:    Component Value Date/Time   K 4.3 03/06/2020 0232   MG 2.2 03/06/2020 0232     Plan: Potassium: K >/= 4: No additional supplementation needed  Magnesium: Mg > 2: No additional supplementation needed    Thank you for allowing pharmacy to participate in this patient's care   Arrie Senate, PharmD, BCPS Clinical Pharmacist [phone] Please check AMION for all [facility] numbers 03/06/2020

## 2020-03-06 NOTE — Progress Notes (Addendum)
Progress Note  Patient Name: Martin Rubio Date of Encounter: 03/06/2020  Primary Cardiologist: Minus Breeding, MD   Subjective   Feels well  Inpatient Medications    Scheduled Meds:  apixaban  5 mg Oral BID   cholecalciferol  1,000 Units Oral Daily   dofetilide  250 mcg Oral BID   levothyroxine  125 mcg Oral QAC breakfast   losartan  50 mg Oral BID   melatonin  4.5 mg Oral QHS   pantoprazole  40 mg Oral Daily   sodium chloride flush  3 mL Intravenous Q12H   tamsulosin  0.4 mg Oral QHS   Continuous Infusions:  sodium chloride     PRN Meds: sodium chloride, acetaminophen, sodium chloride flush   Vital Signs    Vitals:   03/05/20 1718 03/05/20 2028 03/05/20 2343 03/06/20 0233  BP: (!) 145/73 (!) 141/77 140/85 (!) 144/70  Pulse: 65 68 66 68  Resp:  18 18 20   Temp: 98.4 F (36.9 C) 98.2 F (36.8 C) 98.2 F (36.8 C) (!) 97.5 F (36.4 C)  TempSrc: Oral Oral Oral Oral  SpO2: 97%     Weight:    98.4 kg  Height:        Intake/Output Summary (Last 24 hours) at 03/06/2020 0754 Last data filed at 03/05/2020 1700 Gross per 24 hour  Intake 226.77 ml  Output --  Net 226.77 ml   Last 3 Weights 03/06/2020 03/05/2020 03/05/2020  Weight (lbs) 216 lb 14.4 oz 220 lb 6.4 oz 221 lb 3.2 oz  Weight (kg) 98.385 kg 99.973 kg 100.336 kg      Telemetry    SR/VP, AV paced - Personally Reviewed  ECG    SR, AV pacing, QT stable - Personally Reviewed  Physical Exam   GEN: No acute distress.   Neck: No JVD Cardiac: RRR, no murmurs, rubs, or gallops.  Respiratory: CTA b/l. GI: Soft, nontender, non-distended  MS: No edema; No deformity. Neuro:  Nonfocal  Psych: Normal affect   Labs    High Sensitivity Troponin:  No results for input(s): TROPONINIHS in the last 720 hours.    Chemistry Recent Labs  Lab 03/05/20 1130 03/06/20 0232  NA 141 138  K 4.1 4.3  CL 107 104  CO2 24 26  GLUCOSE 101* 96  BUN 16 17  CREATININE 1.51* 1.76*  CALCIUM 9.5 9.2  GFRNONAA 45*  37*  GFRAA 52* 43*  ANIONGAP 10 8     HematologyNo results for input(s): WBC, RBC, HGB, HCT, MCV, MCH, MCHC, RDW, PLT in the last 168 hours.  BNPNo results for input(s): BNP, PROBNP in the last 168 hours.   DDimer No results for input(s): DDIMER in the last 168 hours.   Radiology    No results found.  Cardiac Studies   Echo 12/16/19   1. Left ventricular ejection fraction, by visual estimation, is 55 to  60%. The left ventricle has normal function. There is no increased left  ventricular wall thickness.   2. Left ventricular diastolic parameters are consistent with Grade I  diastolic dysfunction (impaired relaxation).   3. The left ventricle has no regional wall motion abnormalities.   4. Global right ventricle has normal systolc function.The right  ventricular size is normal. no increase in right ventricular wall  thickness.   5. The mitral valve is normal in structure. No evidence of mitral valve  regurgitation. No evidence of mitral stenosis.   6. The tricuspid valve was normal in structure. Tricuspid  valve  regurgitation is not demonstrated.   7. Tricuspid valve regurgitation is not demonstrated.   8. Mild aortic valve sclerosis without stenosis.   9. The pulmonic valve was normal in structure.  10. The inferior vena cava is normal in size with greater than 50%  respiratory variability, suggesting right atrial pressure of 3 mmHg.   Patient Profile     74 y.o. male HTN, HLD, RA, Graves's disease s/p ablation tx >> hypothyroidism, OSA 2/CPAP, AFib, SN dysfunction with PPM  Admitted for Tikosyn initiation  Assessment & Plan    1. Paroxysmal AFib     CHA2DS2Vasc is 3, on Eliquis     Tikosyn initiation in progress      K+ 4.3      Mag 2.2      Creat 1.76 (calc CrCl is 50), Tikosyn appropriately dosed     QT stable  Telemetry and EKG reviewed with Dr. [name] Continue Tikosyn   2. HTN     Continue homoe meds  3. Hypothyroidism     Continue home meds  4. OSA      Continue HS CPAP   For questions or updates, please contact [place] Please consult [email or web address] for contact info under   Signed, Baldwin [place], PA-C  03/06/2020, 7:54 AM    EP Attending  Patient seen and examined. Agree with the findings as noted above. The patient is maintaining NSR and his QT is acceptable. We will plan to DC home on Friday a.m.   Mikle Bosworth.D.

## 2020-03-07 LAB — BASIC METABOLIC PANEL
Anion gap: 13 (ref 5–15)
BUN: 23 mg/dL (ref 8–23)
CO2: 25 mmol/L (ref 22–32)
Calcium: 9.3 mg/dL (ref 8.9–10.3)
Chloride: 102 mmol/L (ref 98–111)
Creatinine, Ser: 1.75 mg/dL — ABNORMAL HIGH (ref 0.61–1.24)
GFR calc Af Amer: 43 mL/min — ABNORMAL LOW (ref >60)
GFR calc non Af Amer: 37 mL/min — ABNORMAL LOW (ref >60)
Glucose, Bld: 105 mg/dL — ABNORMAL HIGH (ref 70–99)
Potassium: 4 mmol/L (ref 3.5–5.1)
Sodium: 140 mmol/L (ref 135–145)

## 2020-03-07 LAB — MAGNESIUM: Magnesium: 2 mg/dL (ref 1.7–2.4)

## 2020-03-07 MED ORDER — MAGNESIUM SULFATE 2 GM/50ML IV SOLN
2.0000 g | Freq: Once | INTRAVENOUS | Status: AC
  Administered 2020-03-07: 09:00:00 2 g via INTRAVENOUS
  Filled 2020-03-07: qty 50

## 2020-03-07 NOTE — Progress Notes (Signed)
Pharmacy: Dofetilide (Tikosyn) - Follow Up Assessment and Electrolyte Replacement  Pharmacy consulted to assist in monitoring and replacing electrolytes in this 74 y.o. male admitted on 03/05/2020 undergoing dofetilide initiation. First dofetilide dose: 03/05/30  Labs:    Component Value Date/Time   K 4.0 03/07/2020 0354   MG 2.0 03/07/2020 0354     Plan: Potassium: K >/= 4: No additional supplementation needed  Magnesium: Mg 1.8-2: Give Mg 2 gm IV x1     Thank you for allowing pharmacy to participate in this patient's care   Erin Hearing PharmD., BCPS Clinical Pharmacist 03/07/2020 8:22 AM

## 2020-03-07 NOTE — Progress Notes (Addendum)
Progress Note  Patient Name: Martin Rubio Date of Encounter: 03/07/2020  Primary Cardiologist: Minus Breeding, MD   Subjective   Feels well, ambulating in room  Inpatient Medications    Scheduled Meds: . apixaban  5 mg Oral BID  . cholecalciferol  1,000 Units Oral Daily  . dofetilide  250 mcg Oral BID  . levothyroxine  125 mcg Oral QAC breakfast  . losartan  50 mg Oral BID  . melatonin  4.5 mg Oral QHS  . pantoprazole  40 mg Oral Daily  . sodium chloride flush  3 mL Intravenous Q12H  . tamsulosin  0.4 mg Oral QHS   Continuous Infusions: . sodium chloride    . magnesium sulfate bolus IVPB     PRN Meds: sodium chloride, acetaminophen, sodium chloride flush   Vital Signs    Vitals:   03/06/20 0233 03/06/20 1456 03/06/20 2107 03/07/20 0602  BP: (!) 144/70 131/75 (!) 146/82 131/75  Pulse: 68 63 60 60  Resp: 20 18 18 18   Temp: (!) 97.5 F (36.4 C) 98.5 F (36.9 C) 98 F (36.7 C) 98.2 F (36.8 C)  TempSrc: Oral Oral Oral Oral  SpO2:  95% 96% 98%  Weight: 98.4 kg   97.5 kg  Height:        Intake/Output Summary (Last 24 hours) at 03/07/2020 0856 Last data filed at 03/06/2020 2100 Gross per 24 hour  Intake 120 ml  Output --  Net 120 ml   Last 3 Weights 03/07/2020 03/06/2020 03/05/2020  Weight (lbs) 214 lb 14.4 oz 216 lb 14.4 oz 220 lb 6.4 oz  Weight (kg) 97.478 kg 98.385 kg 99.973 kg      Telemetry    SR/VP, AV paced - Personally Reviewed  ECG    SR, AV pacing, QT stable - Personally Reviewed  Physical Exam   No change in PE  GEN: No acute distress.   Neck: No JVD Cardiac: RRR, no murmurs, rubs, or gallops.  Respiratory: CTA b/l. GI: Soft, nontender, non-distended  MS: No edema; No deformity. Neuro:  Nonfocal  Psych: Normal affect   Labs    High Sensitivity Troponin:  No results for input(s): TROPONINIHS in the last 720 hours.    Chemistry Recent Labs  Lab 03/05/20 1130 03/06/20 0232 03/07/20 0354  NA 141 138 140  K 4.1 4.3 4.0  CL 107  104 102  CO2 24 26 25   GLUCOSE 101* 96 105*  BUN 16 17 23   CREATININE 1.51* 1.76* 1.75*  CALCIUM 9.5 9.2 9.3  GFRNONAA 45* 37* 37*  GFRAA 52* 43* 43*  ANIONGAP 10 8 13      HematologyNo results for input(s): WBC, RBC, HGB, HCT, MCV, MCH, MCHC, RDW, PLT in the last 168 hours.  BNPNo results for input(s): BNP, PROBNP in the last 168 hours.   DDimer No results for input(s): DDIMER in the last 168 hours.   Radiology    No results found.  Cardiac Studies   Echo 12/16/19   1. Left ventricular ejection fraction, by visual estimation, is 55 to  60%. The left ventricle has normal function. There is no increased left  ventricular wall thickness.   2. Left ventricular diastolic parameters are consistent with Grade I  diastolic dysfunction (impaired relaxation).   3. The left ventricle has no regional wall motion abnormalities.   4. Global right ventricle has normal systolc function.The right  ventricular size is normal. no increase in right ventricular wall  thickness.   5. The  mitral valve is normal in structure. No evidence of mitral valve  regurgitation. No evidence of mitral stenosis.   6. The tricuspid valve was normal in structure. Tricuspid valve  regurgitation is not demonstrated.   7. Tricuspid valve regurgitation is not demonstrated.   8. Mild aortic valve sclerosis without stenosis.   9. The pulmonic valve was normal in structure.  10. The inferior vena cava is normal in size with greater than 50%  respiratory variability, suggesting right atrial pressure of 3 mmHg.   Patient Profile     74 y.o. male HTN, HLD, RA, Graves's disease s/p ablation tx >> hypothyroidism, OSA 2/CPAP, AFib, SN dysfunction with PPM  Admitted for Tikosyn initiation  Assessment & Plan    1. Paroxysmal AFib     CHA2DS2Vasc is 3, on Eliquis     Tikosyn initiation in progress      K+ 4.0      Mag 2.0      Creat 1.75 stable     QT stable  Telemetry and EKG reviewed with Dr. [name] Continue  Tikosyn  Anticipate discharge tomorrow  2. HTN     Continue homoe meds  3. Hypothyroidism     Continue home meds  4. OSA     Continue HS CPAP   For questions or updates, please contact [place] Please consult [email or web address] for contact info under   Signed, Baldwin Jamaica, PA-C  03/07/2020, 8:56 AM    EP Attending  Patient seen and examined. Agree with above. The patient continues to remain in NSR with ventricular pacing. He will continue dofetilide and plan for DC home tomorrow if his QT is ok.   Mikle Bosworth.D.

## 2020-03-07 NOTE — Care Management (Signed)
1223 03-07-20 Patient presented for Tikosyn Load. Benefits check submitted and patient is aware of cost. Patient wants to use the Good Rx app for cheaper prices. Patient wants a paper Rx for a 30 day supply to take with him at discharge. Case Manager did call [facility] and Dofetilide 250 mcg is in stock. No further needs from Case Manager at this time. Graves-Bigelow, Ocie Cornfield, RN, BSN Case Manager

## 2020-03-07 NOTE — Discharge Summary (Addendum)
ELECTROPHYSIOLOGY PROCEDURE DISCHARGE SUMMARY    Patient ID: Martin Rubio,  MRN: [id number], DOB/AGE: [birth year] 74 y.o.  Admit date: 03/05/2020 Discharge date: 03/08/2020  Primary Care Physician: Colon Branch, MD Primary Cardiologist: Dr. [name] Electrophysiologist: Dr. [name]  Primary Discharge Diagnosis:  1.  Paroxysmal atrial fibrillation status post Tikosyn loading this admission      CHA2DS2Vasc is 3, on Eliquis  Secondary Discharge Diagnosis:  1. HTN 2. Hypothyroidism 3. OSA w/CPAP 4. SN dysfunction w/PPM 5. CKD (III)   Allergies  Allergen Reactions   Codeine     Tension, nasty feeling    Nsaids Other (See Comments)    Renal insufficiency   Prednisone Other (See Comments)    Unknown/ sometimes takes with lower dose   Statins Other (See Comments)    Joint pain    Sulfa Antibiotics Other (See Comments)    Joint pain     Procedures This Admission:  1.  Tikosyn loading   Brief HPI: Martin Rubio is a 74 y.o. male with a past medical history as noted above.  He follows with EP in the outpatient setting for his PPM and atrial fibrillation.  Risks, benefits, and alternatives to Tikosyn were reviewed with the patient who wished to proceed.    Hospital Course:  The patient was admitted and Tikosyn was initiated.  Renal function and electrolytes were followed during the hospitalization.  His QTc remained stable.  He arrived in and maintained SR.  On the day of discharge he feels well, was examined by Dr [name] who considered the patient stable for discharge to home.  Follow-up has been arranged with AFib clinic in 1 week and with Dr [name] in 4 weeks.   Creat up some today, he will have labs done next week.  Encouraged better water/oral intake.  His dose of Tikosyn remains appropriate.  Tikosyn teaching is completed.  The patient requests paper rx for the dofetilide, case manager has noted that the [facility] on [address] has drug in stock and  patient is aware.  Discussed that not all pharmacies carry and not to wait to last moment to fill his drug.  They are aware of cost with [facility] and is affordable for him.  Physical Exam: Vitals:   03/07/20 2112 03/07/20 2328 03/08/20 0447 03/08/20 0851  BP: (!) 168/81 130/70 (!) 147/80 128/73  Pulse: (!) 59 60 (!) 59 64  Resp: 19 18 20 20   Temp: 98.1 F (36.7 C) 98.6 F (37 C) 97.6 F (36.4 C)   TempSrc: Oral Oral Oral   SpO2: 97% 95% 98% 98%  Weight:   97.5 kg   Height:         GEN- The patient is well appearing, alert and oriented x 3 today.   HEENT: normocephalic, atraumatic; sclera clear, conjunctiva pink; hearing intact; oropharynx clear; neck supple, no JVP Lymph- no cervical lymphadenopathy Lungs- CTA b/l, normal work of breathing.  No wheezes, rales, rhonchi Heart- RRR, no murmurs, rubs or gallops, PMI not laterally displaced GI- soft, non-tender, non-distended Extremities- no clubbing, cyanosis, or edema MS- no significant deformity or atrophy Skin- warm and dry, no rash or lesion Psych- euthymic mood, full affect Neuro- strength and sensation are intact   Labs:   Lab Results  Component Value Date   WBC 9.4 12/15/2019   HGB 13.4 12/15/2019   HCT 45.0 12/15/2019   MCV 76.5 (L) 12/15/2019   PLT 197 12/15/2019  Recent Labs  Lab 03/08/20 0304  NA 139  K 4.4  CL 102  CO2 26  BUN 19  CREATININE 1.94*  CALCIUM 9.4  GLUCOSE 99     Discharge Medications:  Allergies as of 03/08/2020       Reactions   Codeine    Tension, nasty feeling   Nsaids Other (See Comments)   Renal insufficiency   Prednisone Other (See Comments)   Unknown/ sometimes takes with lower dose   Statins Other (See Comments)   Joint pain   Sulfa Antibiotics Other (See Comments)   Joint pain        Medication List     TAKE these medications    acetaminophen 325 MG tablet Commonly known as: TYLENOL Take 650 mg by mouth every 6 (six) hours as needed for  mild pain.   alendronate 70 MG tablet Commonly known as: FOSAMAX Take 1 tablet (70 mg total) by mouth once a week. Take with a full glass of water on an empty stomach. What changed: additional instructions   apixaban 5 MG Tabs tablet Commonly known as: Eliquis Take 1 tablet (5 mg total) by mouth 2 (two) times daily.   dofetilide 250 MCG capsule Commonly known as: TIKOSYN Take 1 capsule (250 mcg total) by mouth 2 (two) times daily.   fluticasone 50 MCG/ACT nasal spray Commonly known as: FLONASE Place 1 spray into both nostrils as needed for allergies.   levothyroxine 125 MCG tablet Commonly known as: SYNTHROID TAKE 1 TABLET (125 MCG TOTAL) BY MOUTH DAILY ON AN EMPTY STOMACH What changed: See the new instructions.   losartan 50 MG tablet Commonly known as: COZAAR Take 1 tablet (50 mg total) by mouth 2 (two) times daily. What changed:  when to take this reasons to take this   melatonin 5 MG Tabs Take 5 mg by mouth at bedtime.   pantoprazole 40 MG tablet Commonly known as: PROTONIX Take 40 mg by mouth daily.   Praluent 150 MG/ML Soaj Generic drug: Alirocumab Inject 150 mg into the skin every 14 (fourteen) days.   sildenafil 20 MG tablet Commonly known as: REVATIO Take 60-100 mg by mouth daily as needed for erectile dysfunction.   STELARA [place] Inject into the skin. Per Rheumatology -every 3 months   tamsulosin 0.4 MG Caps capsule Commonly known as: FLOMAX Take 0.4 mg by mouth at bedtime.   triamcinolone cream 0.1 % Commonly known as: KENALOG Apply 1 application topically as needed for itching.   Vitamin B 12 500 MCG Tabs Take 1,000 mcg by mouth daily.   VITAMIN C ADULT GUMMIES PO Take by mouth. 2 gummies daily   Vitamin D 125 MCG (5000 UT) Caps Take 5,000 Units by mouth daily.   zinc gluconate 50 MG tablet Take 50 mg by mouth at bedtime.        Disposition: Home  Discharge Instructions     Diet - low sodium heart healthy   Complete by: As  directed    Increase activity slowly   Complete by: As directed       Follow-up Information     [facility] Follow up.   Specialty: Cardiology Why: 03/14/2020 @ 9:30AM, with C. Marlene Lard, [place] Contact information: [address] [id number] mc [place]        Evans Lance, MD Follow up.   Specialty: Cardiology Why: 04/09/2020 @ 1:45PM Contact information: [address] [place] [address] [phone]  Duration of Discharge Encounter: Greater than 30 minutes including physician time.  Venetia Night, PA-C 03/08/2020 12:36 PM  EP Attending  Patient seen and examined. Agree with the findings as noted above. The patient is doing well and his QT is acceptable and he has maintained NSR with ventricular pacing. He will be discharged home with labs and an ECG in a week as per protocol.   Mikle Bosworth.D.

## 2020-03-08 LAB — BASIC METABOLIC PANEL
Anion gap: 11 (ref 5–15)
BUN: 19 mg/dL (ref 8–23)
CO2: 26 mmol/L (ref 22–32)
Calcium: 9.4 mg/dL (ref 8.9–10.3)
Chloride: 102 mmol/L (ref 98–111)
Creatinine, Ser: 1.94 mg/dL — ABNORMAL HIGH (ref 0.61–1.24)
GFR calc Af Amer: 38 mL/min — ABNORMAL LOW (ref >60)
GFR calc non Af Amer: 33 mL/min — ABNORMAL LOW (ref >60)
Glucose, Bld: 99 mg/dL (ref 70–99)
Potassium: 4.4 mmol/L (ref 3.5–5.1)
Sodium: 139 mmol/L (ref 135–145)

## 2020-03-08 LAB — MAGNESIUM: Magnesium: 2.4 mg/dL (ref 1.7–2.4)

## 2020-03-08 MED ORDER — DOFETILIDE 250 MCG PO CAPS: 250.0000 ug | ORAL_CAPSULE | Freq: Two times a day (BID) | ORAL | 6 refills | Status: DC

## 2020-03-08 NOTE — Discharge Instructions (Signed)
[email or web address]

## 2020-03-08 NOTE — Progress Notes (Signed)
Pharmacy: Dofetilide (Tikosyn) - Follow Up Assessment and Electrolyte Replacement  Pharmacy consulted to assist in monitoring and replacing electrolytes in this 74 y.o. male admitted on 03/05/2020 undergoing dofetilide initiation. First dofetilide dose: 03/05/30  Labs:    Component Value Date/Time   K 4.4 03/08/2020 0304   MG 2.4 03/08/2020 0304     Plan: Potassium: K >/= 4: No additional supplementation needed  Magnesium: Mg > 2: No additional supplementation needed   No potassium supplementation needed at discharge.   Thank you for allowing pharmacy to participate in this patient's care   Erin Hearing PharmD., BCPS Clinical Pharmacist 03/08/2020 7:30 AM

## 2020-03-09 ENCOUNTER — Other Ambulatory Visit: Payer: Self-pay | Admitting: Family Medicine

## 2020-03-09 DIAGNOSIS — E890 Postprocedural hypothyroidism: Secondary | ICD-10-CM

## 2020-03-11 NOTE — Telephone Encounter (Signed)
JP-Plz see refill req/thx dmf

## 2020-03-13 ENCOUNTER — Encounter: Payer: Medicare Other | Admitting: Internal Medicine

## 2020-03-14 ENCOUNTER — Ambulatory Visit (INDEPENDENT_AMBULATORY_CARE_PROVIDER_SITE_OTHER): Payer: Medicare Other | Admitting: *Deleted

## 2020-03-14 ENCOUNTER — Other Ambulatory Visit: Payer: Self-pay

## 2020-03-14 ENCOUNTER — Encounter (HOSPITAL_COMMUNITY): Payer: Self-pay | Admitting: Physician Assistant

## 2020-03-14 ENCOUNTER — Ambulatory Visit (HOSPITAL_COMMUNITY)
Admit: 2020-03-14 | Discharge: 2020-03-14 | Disposition: A | Discharge status: Home / Self Care | Payer: Medicare Other | Source: Ambulatory Visit | Attending: Physician Assistant | Admitting: Physician Assistant

## 2020-03-14 VITALS — BP 120/60 | HR 59 | Ht 70.0 in | Wt 218.6 lb

## 2020-03-14 DIAGNOSIS — Z79899 Other long term (current) drug therapy: Secondary | ICD-10-CM | POA: Insufficient documentation

## 2020-03-14 DIAGNOSIS — I10 Essential (primary) hypertension: Secondary | ICD-10-CM | POA: Diagnosis not present

## 2020-03-14 DIAGNOSIS — G4733 Obstructive sleep apnea (adult) (pediatric): Secondary | ICD-10-CM | POA: Diagnosis not present

## 2020-03-14 DIAGNOSIS — E785 Hyperlipidemia, unspecified: Secondary | ICD-10-CM | POA: Insufficient documentation

## 2020-03-14 DIAGNOSIS — I495 Sick sinus syndrome: Secondary | ICD-10-CM | POA: Insufficient documentation

## 2020-03-14 DIAGNOSIS — M069 Rheumatoid arthritis, unspecified: Secondary | ICD-10-CM | POA: Insufficient documentation

## 2020-03-14 DIAGNOSIS — I4892 Unspecified atrial flutter: Secondary | ICD-10-CM | POA: Diagnosis not present

## 2020-03-14 DIAGNOSIS — Z7901 Long term (current) use of anticoagulants: Secondary | ICD-10-CM | POA: Insufficient documentation

## 2020-03-14 DIAGNOSIS — D6869 Other thrombophilia: Secondary | ICD-10-CM | POA: Insufficient documentation

## 2020-03-14 DIAGNOSIS — I480 Paroxysmal atrial fibrillation: Secondary | ICD-10-CM | POA: Insufficient documentation

## 2020-03-14 DIAGNOSIS — Z87891 Personal history of nicotine dependence: Secondary | ICD-10-CM | POA: Diagnosis not present

## 2020-03-14 LAB — BASIC METABOLIC PANEL
Anion gap: 11 (ref 5–15)
BUN: 16 mg/dL (ref 8–23)
CO2: 24 mmol/L (ref 22–32)
Calcium: 9.1 mg/dL (ref 8.9–10.3)
Chloride: 106 mmol/L (ref 98–111)
Creatinine, Ser: 1.46 mg/dL — ABNORMAL HIGH (ref 0.61–1.24)
GFR calc Af Amer: 54 mL/min — ABNORMAL LOW (ref >60)
GFR calc non Af Amer: 46 mL/min — ABNORMAL LOW (ref >60)
Glucose, Bld: 98 mg/dL (ref 70–99)
Potassium: 4.5 mmol/L (ref 3.5–5.1)
Sodium: 141 mmol/L (ref 135–145)

## 2020-03-14 LAB — CUP PACEART REMOTE DEVICE CHECK
Battery Remaining Longevity: 97 mo
Battery Voltage: 3.15 V
Brady Statistic AP VP Percent: 60.52 %
Brady Statistic AP VS Percent: 0.03 %
Brady Statistic AS VP Percent: 38.99 %
Brady Statistic AS VS Percent: 0.47 %
Brady Statistic RA Percent Paced: 56.28 %
Brady Statistic RV Percent Paced: 96.08 %
Date Time Interrogation Session: 20210408071453
Implantable Lead Implant Date: 20210106
Implantable Lead Implant Date: 20210106
Implantable Lead Location: 753859
Implantable Lead Location: 753860
Implantable Lead Model: 3830
Implantable Lead Model: 5076
Implantable Pulse Generator Implant Date: 20210106
Lead Channel Impedance Value: 304 Ohm
Lead Channel Impedance Value: 361 Ohm
Lead Channel Impedance Value: 399 Ohm
Lead Channel Impedance Value: 532 Ohm
Lead Channel Pacing Threshold Amplitude: 0.5 V
Lead Channel Pacing Threshold Amplitude: 0.625 V
Lead Channel Pacing Threshold Pulse Width: 0.4 ms
Lead Channel Pacing Threshold Pulse Width: 0.4 ms
Lead Channel Sensing Intrinsic Amplitude: 3.875 mV
Lead Channel Sensing Intrinsic Amplitude: 3.875 mV
Lead Channel Sensing Intrinsic Amplitude: 31.625 mV
Lead Channel Sensing Intrinsic Amplitude: 31.625 mV
Lead Channel Setting Pacing Amplitude: 3.25 V
Lead Channel Setting Pacing Amplitude: 3.25 V
Lead Channel Setting Pacing Pulse Width: 0.4 ms
Lead Channel Setting Sensing Sensitivity: 2 mV

## 2020-03-14 LAB — MAGNESIUM: Magnesium: 1.9 mg/dL (ref 1.7–2.4)

## 2020-03-14 NOTE — Progress Notes (Signed)
Primary Care Physician: Colon Branch, MD Primary Cardiologist: Dr [name]  Primary Electrophysiologist: Dr [name] Referring Physician: Dr [name] is a 74 y.o. male with a history of HTN, paroxysmal atrial fibrillation, atrial flutter, OSA, HLD, Grave's disease, sinus node dysfunction s/p PPM who presents for follow up in the [facility]. Patient is on Eliquis for a CHADS2VASC score of 3. Patient underwent PPM implant on 12/13/19 for his sinus node dysfunction. Patient has had episodes of afib lasting 2-3 hours with symptoms of fatigue and dyspnea with exertion. He was seen by Dr [name] on 01/29/20 who recommended dofetilide.   On follow up today, patient is s/p dofetilide loading. Patient reports he has done reasonably well since his hospitalization. He did have a dizzy spell while standing in the kitchen which resolved quickly. No presyncope or falls. He denies symptoms of afib.  Today, he denies symptoms of palpitations, chest pain, orthopnea, PND, lower extremity edema, dizziness, presyncope, syncope, snoring, daytime somnolence, bleeding, or neurologic sequela. The patient is tolerating medications without difficulties and is otherwise without complaint today.    Atrial Fibrillation Risk Factors:  he does have symptoms or diagnosis of sleep apnea. he is compliant with CPAP therapy. he does not have a history of rheumatic fever. he does have a history of alcohol use.   he has a BMI of Body mass index is 31.37 kg/m.Marland Kitchen Filed Weights   03/14/20 0949  Weight: 99.2 kg    Family History  Problem Relation Age of Onset  . CAD Father 108       Died age 23  . CAD Mother 12       CABG  . Diabetes Brother      Atrial Fibrillation Management history:  Previous antiarrhythmic drugs: dofetilide Previous cardioversions: none Previous ablations: none CHADS2VASC score: 3 Anticoagulation history: Eliquis   Past Medical History:    Diagnosis Date  . Bronchitis   . Diverticulosis   . FH: BPH (benign prostatic hypertrophy)   . History of Graves' disease 01/19/2018   S/p ablation  . HTN (hypertension)   . Hyperlipidemia   . Obesity   . Psoriatic arthritis ([place])   . Rheumatoid arthritis ([place]) 08/07/2014  . Sleep apnea    CPAP  . Thyroid disease    Past Surgical History:  Procedure Laterality Date  . INGUINAL HERNIA REPAIR     bilateral  . KNEE SURGERY     bilateral arthroscopic  . PACEMAKER IMPLANT N/A 12/13/2019   Procedure: PACEMAKER IMPLANT;  Surgeon: Evans Lance, MD;  Location: [facility];  Service: Cardiovascular;  Laterality: N/A;  . TRANSURETHRAL RESECTION OF PROSTATE    . UMBILICAL HERNIA REPAIR      Current Outpatient Medications  Medication Sig Dispense Refill  . acetaminophen (TYLENOL) 325 MG tablet Take 650 mg by mouth every 6 (six) hours as needed for mild pain.    Marland Kitchen alendronate (FOSAMAX) 70 MG tablet Take 1 tablet (70 mg total) by mouth once a week. Take with a full glass of water on an empty stomach. 26 tablet 1  . Alirocumab (PRALUENT) 150 MG/ML SOAJ Inject 150 mg into the skin every 14 (fourteen) days. 6 pen 3  . apixaban (ELIQUIS) 5 MG TABS tablet Take 1 tablet (5 mg total) by mouth 2 (two) times daily. 180 tablet 3  . Ascorbic Acid (VITAMIN C ADULT GUMMIES PO) Take by mouth. 2 gummies daily    . Cholecalciferol (  VITAMIN D) 125 MCG (5000 UT) CAPS Take 5,000 Units by mouth daily.     . Cyanocobalamin (VITAMIN B 12) 500 MCG TABS Take 1,000 mcg by mouth daily.     Marland Kitchen dofetilide (TIKOSYN) 250 MCG capsule Take 1 capsule (250 mcg total) by mouth 2 (two) times daily. 60 capsule 6  . fluticasone (FLONASE) 50 MCG/ACT nasal spray Place 1 spray into both nostrils as needed for allergies.     Marland Kitchen levothyroxine (SYNTHROID) 125 MCG tablet Take 1 tablet (125 mcg total) by mouth daily before breakfast. 90 tablet 1  . losartan (COZAAR) 50 MG tablet Take 1 tablet (50 mg total) by mouth 2 (two) times  daily. (Patient taking differently: Take 50 mg by mouth 2 (two) times daily as needed (Blood pressure). ) 180 tablet 1  . Melatonin 5 MG TABS Take 5 mg by mouth at bedtime.    . pantoprazole (PROTONIX) 40 MG tablet Take 40 mg by mouth daily.    . sildenafil (REVATIO) 20 MG tablet Take 60-100 mg by mouth daily as needed for erectile dysfunction.    . tamsulosin (FLOMAX) 0.4 MG CAPS capsule Take 0.4 mg by mouth at bedtime.     . triamcinolone cream (KENALOG) 0.1 % Apply 1 application topically as needed for itching.    Marland Kitchen Ustekinumab (STELARA [place]) Inject into the skin. Per Rheumatology -every 3 months    . zinc gluconate 50 MG tablet Take 50 mg by mouth at bedtime.     No current facility-administered medications for this encounter.    Allergies  Allergen Reactions  . Codeine     Tension, nasty feeling   . Nsaids Other (See Comments)    Renal insufficiency  . Prednisone Other (See Comments)    Unknown/ sometimes takes with lower dose  . Statins Other (See Comments)    Joint pain   . Sulfa Antibiotics Other (See Comments)    Joint pain    Social History   Socioeconomic History  . Marital status: Married    Spouse name: Not on file  . Number of children: 2  . Years of education: Not on file  . Highest education level: Not on file  Occupational History  . Occupation: Retired    Fish farm manager: [employer]  Tobacco Use  . Smoking status: Former Smoker    Types: Cigarettes  . Smokeless tobacco: Never Used  . Tobacco comment: Quit 30 years ago.  Substance and Sexual Activity  . Alcohol use: No  . Drug use: No  . Sexual activity: Not on file  Other Topics Concern  . Not on file  Social History Narrative   Lives with wife.   Social Determinants of Health   Financial Resource Strain:   . Difficulty of Paying Living Expenses:   Food Insecurity:   . Worried About Charity fundraiser in the Last Year:   . Arboriculturist in the Last Year:   Transportation Needs:   . Lexicographer (Medical):   Marland Kitchen Lack of Transportation (Non-Medical):   Physical Activity:   . Days of Exercise per Week:   . Minutes of Exercise per Session:   Stress:   . Feeling of Stress :   Social Connections:   . Frequency of Communication with Friends and Family:   . Frequency of Social Gatherings with Friends and Family:   . Attends Religious Services:   . Active Member of Clubs or Organizations:   . Attends Archivist  Meetings:   Marland Kitchen Marital Status:   Intimate Partner Violence:   . Fear of Current or Ex-Partner:   . Emotionally Abused:   Marland Kitchen Physically Abused:   . Sexually Abused:      ROS- All systems are reviewed and negative except as per the HPI above.  Physical Exam: Vitals:   03/14/20 0949  BP: 120/60  Pulse: (!) 59  Weight: 99.2 kg  Height: 5\' 10"  (1.778 m)    GEN- The patient is well appearing obese elderly male, alert and oriented x 3 today.   HEENT-head normocephalic, atraumatic, sclera clear, conjunctiva pink, hearing intact, trachea midline. Lungs- Clear to ausculation bilaterally, normal work of breathing Heart- Regular rate and rhythm, no murmurs, rubs or gallops  GI- soft, NT, ND, + BS Extremities- no clubbing, cyanosis, or edema MS- no significant deformity or atrophy Skin- no rash or lesion Psych- euthymic mood, full affect Neuro- strength and sensation are intact   Wt Readings from Last 3 Encounters:  03/14/20 99.2 kg  03/08/20 97.5 kg  03/05/20 100.3 kg    EKG today demonstrates AV dual paced rhythm HR 59, PR 176, QRS 134, QTc 467  Echo 12/16/19 demonstrated  1. Left ventricular ejection fraction, by visual estimation, is 55 to  60%. The left ventricle has normal function. There is no increased left  ventricular wall thickness.  2. Left ventricular diastolic parameters are consistent with Grade I  diastolic dysfunction (impaired relaxation).  3. The left ventricle has no regional wall motion abnormalities.  4. Global right  ventricle has normal systolc function.The right  ventricular size is normal. no increase in right ventricular wall  thickness.  5. The mitral valve is normal in structure. No evidence of mitral valve  regurgitation. No evidence of mitral stenosis.  6. The tricuspid valve was normal in structure. Tricuspid valve  regurgitation is not demonstrated.  7. Tricuspid valve regurgitation is not demonstrated.  8. Mild aortic valve sclerosis without stenosis.  9. The pulmonic valve was normal in structure.  10. The inferior vena cava is normal in size with greater than 50%  respiratory variability, suggesting right atrial pressure of 3 mmHg.   Epic records are reviewed at length today  CHA2DS2-VASc Score = 3 The patient's score is based upon: CHF History: No HTN History: Yes Age : 33 + Diabetes History: No Stroke History: No Vascular Disease History: No Gender: Male   ASSESSMENT AND PLAN: 1. Paroxysmal Atrial Fibrillation/atrial flutter The patient's CHA2DS2-VASc score is 3, indicating a 3.2% annual risk of stroke.   S/p dofetilide loading 3/30-03/08/20. Patient appears to be maintaining SR. Continue dofetilide 250 mcg BID. QT stable. Check bmet/mag today. Continue Eliquis 5 mg BID  2. Secondary Hypercoagulable State (ICD10:  D68.69) The patient is at significant risk for stroke/thromboembolism based upon his CHA2DS2-VASc Score of 3.  Continue Apixaban (Eliquis).   3. Obesity Body mass index is 31.37 kg/m. The importance of adequate treatment of sleep apnea was discussed today in order to improve our ability to maintain sinus rhythm long term.  4. Obstructive sleep apnea The importance of adequate treatment of sleep apnea was discussed today in order to improve our ability to maintain sinus rhythm long term. Patient reports compliance with CPAP therapy.  5. HTN Stable, no changes today.  6. Sinus node dysfunction S/p PPM, followed by Dr [name] and the device  clinic.   Follow up with Dr [name] and Dr [name] as scheduled.    [place]  [facility] [address] [place], [place] [address] [phone] 03/14/2020 11:42 AM

## 2020-03-14 NOTE — Progress Notes (Signed)
PPM Remote  

## 2020-03-20 ENCOUNTER — Other Ambulatory Visit: Payer: Self-pay | Admitting: Family Medicine

## 2020-03-20 NOTE — Telephone Encounter (Signed)
JP-Plz see refill req/thx dmf

## 2020-04-09 ENCOUNTER — Other Ambulatory Visit: Payer: Self-pay

## 2020-04-09 ENCOUNTER — Ambulatory Visit (INDEPENDENT_AMBULATORY_CARE_PROVIDER_SITE_OTHER): Payer: Medicare Other | Admitting: Internal Medicine

## 2020-04-09 ENCOUNTER — Encounter: Payer: Self-pay | Admitting: Internal Medicine

## 2020-04-09 VITALS — BP 134/86 | HR 61 | Ht 70.0 in | Wt 210.0 lb

## 2020-04-09 DIAGNOSIS — G4733 Obstructive sleep apnea (adult) (pediatric): Secondary | ICD-10-CM

## 2020-04-09 DIAGNOSIS — I495 Sick sinus syndrome: Secondary | ICD-10-CM | POA: Diagnosis not present

## 2020-04-09 DIAGNOSIS — I10 Essential (primary) hypertension: Secondary | ICD-10-CM

## 2020-04-09 DIAGNOSIS — I480 Paroxysmal atrial fibrillation: Secondary | ICD-10-CM

## 2020-04-09 DIAGNOSIS — Z950 Presence of cardiac pacemaker: Secondary | ICD-10-CM

## 2020-04-09 MED ORDER — FUROSEMIDE 20 MG PO TABS: 20.0000 mg | ORAL_TABLET | Freq: Every day | ORAL | 3 refills | Status: DC

## 2020-04-09 NOTE — Patient Instructions (Addendum)
Medication Instructions:  Your physician has recommended you make the following change in your medication:   1.  Start taking furosemide 20 mg-- Take one tablet by mouth daily   Labwork: You will get lab work:  Apr 17, 2020 any time between 8:00 am and 4:00 pm.  You do not need to be fasting.  Testing/Procedures: None ordered.  Follow-Up: Your physician wants you to follow-up in: 6 months with Dr. [name].   You will receive a reminder letter in the mail two months in advance. If you don't receive a letter, please call our office to schedule the follow-up appointment.  Remote monitoring is used to monitor your Pacemaker from home. This monitoring reduces the number of office visits required to check your device to one time per year. It allows [place] to keep an eye on the functioning of your device to ensure it is working properly. You are scheduled for a device check from home on 06/13/2020. You may send your transmission at any time that day. If you have a wireless device, the transmission will be sent automatically. After your physician reviews your transmission, you will receive a postcard with your next transmission date.  Any Other Special Instructions Will Be Listed Below (If Applicable).  If you need a refill on your cardiac medications before your next appointment, please call your pharmacy.

## 2020-04-09 NOTE — Progress Notes (Signed)
HPI Mr. Gruenhagen returns today for followup. He is a pleasant 74 yo man with CHB who underwent insertion of a DDD PM several months ago. He has experienced some sob and admits to sodium indiscretion. No chest pain. He has not gotten a Covid vaccine. He denies peripheral edema.  Allergies  Allergen Reactions  . Codeine     Tension, nasty feeling   . Nsaids Other (See Comments)    Renal insufficiency  . Prednisone Other (See Comments)    Unknown/ sometimes takes with lower dose  . Statins Other (See Comments)    Joint pain   . Sulfa Antibiotics Other (See Comments)    Joint pain     Current Outpatient Medications  Medication Sig Dispense Refill  . acetaminophen (TYLENOL) 325 MG tablet Take 650 mg by mouth every 6 (six) hours as needed for mild pain.    Marland Kitchen alendronate (FOSAMAX) 70 MG tablet Take 1 tablet (70 mg total) by mouth once a week. Take with a full glass of water on an empty stomach. 12 tablet 3  . Alirocumab (PRALUENT) 150 MG/ML SOAJ Inject 150 mg into the skin every 14 (fourteen) days. 6 pen 3  . apixaban (ELIQUIS) 5 MG TABS tablet Take 1 tablet (5 mg total) by mouth 2 (two) times daily. 180 tablet 3  . Ascorbic Acid (VITAMIN C ADULT GUMMIES PO) Take by mouth. 2 gummies daily    . Cholecalciferol (VITAMIN D) 125 MCG (5000 UT) CAPS Take 5,000 Units by mouth daily.     . Cyanocobalamin (VITAMIN B 12) 500 MCG TABS Take 1,000 mcg by mouth daily.     Marland Kitchen dofetilide (TIKOSYN) 250 MCG capsule Take 1 capsule (250 mcg total) by mouth 2 (two) times daily. 60 capsule 6  . fluticasone (FLONASE) 50 MCG/ACT nasal spray Place 1 spray into both nostrils as needed for allergies.     Marland Kitchen levothyroxine (SYNTHROID) 125 MCG tablet Take 1 tablet (125 mcg total) by mouth daily before breakfast. 90 tablet 1  . losartan (COZAAR) 50 MG tablet Take 1 tablet (50 mg total) by mouth 2 (two) times daily. (Patient taking differently: Take 50 mg by mouth 2 (two) times daily as needed (Blood pressure). ) 180  tablet 1  . Melatonin 5 MG TABS Take 5 mg by mouth at bedtime.    . pantoprazole (PROTONIX) 40 MG tablet Take 40 mg by mouth daily.    . sildenafil (REVATIO) 20 MG tablet Take 60-100 mg by mouth daily as needed for erectile dysfunction.    . tamsulosin (FLOMAX) 0.4 MG CAPS capsule Take 0.4 mg by mouth at bedtime.     . triamcinolone cream (KENALOG) 0.1 % Apply 1 application topically as needed for itching.    Marland Kitchen Ustekinumab (STELARA ) Inject into the skin. Per Rheumatology -every 3 months    . zinc gluconate 50 MG tablet Take 50 mg by mouth at bedtime.     No current facility-administered medications for this visit.     Past Medical History:  Diagnosis Date  . Bronchitis   . Diverticulosis   . FH: BPH (benign prostatic hypertrophy)   . History of Graves' disease 01/19/2018   S/p ablation  . HTN (hypertension)   . Hyperlipidemia   . Obesity   . Psoriatic arthritis ([place])   . Rheumatoid arthritis ([place]) 08/07/2014  . Sleep apnea    CPAP  . Thyroid disease     ROS:   All systems reviewed and negative  except as noted in the HPI.   Past Surgical History:  Procedure Laterality Date  . INGUINAL HERNIA REPAIR     bilateral  . KNEE SURGERY     bilateral arthroscopic  . PACEMAKER IMPLANT N/A 12/13/2019   Procedure: PACEMAKER IMPLANT;  Surgeon: Evans Lance, MD;  Location: [facility];  Service: Cardiovascular;  Laterality: N/A;  . TRANSURETHRAL RESECTION OF PROSTATE    . UMBILICAL HERNIA REPAIR       Family History  Problem Relation Age of Onset  . CAD Father 57       Died age 85  . CAD Mother 73       CABG  . Diabetes Brother      Social History   Socioeconomic History  . Marital status: Married    Spouse name: Not on file  . Number of children: 2  . Years of education: Not on file  . Highest education level: Not on file  Occupational History  . Occupation: Retired    Fish farm manager: [employer]  Tobacco Use  . Smoking status: Former Smoker    Types:  Cigarettes  . Smokeless tobacco: Never Used  . Tobacco comment: Quit 30 years ago.  Substance and Sexual Activity  . Alcohol use: No  . Drug use: No  . Sexual activity: Not on file  Other Topics Concern  . Not on file  Social History Narrative   Lives with wife.   Social Determinants of Health   Financial Resource Strain:   . Difficulty of Paying Living Expenses:   Food Insecurity:   . Worried About Charity fundraiser in the Last Year:   . Arboriculturist in the Last Year:   Transportation Needs:   . Film/video editor (Medical):   Marland Kitchen Lack of Transportation (Non-Medical):   Physical Activity:   . Days of Exercise per Week:   . Minutes of Exercise per Session:   Stress:   . Feeling of Stress :   Social Connections:   . Frequency of Communication with Friends and Family:   . Frequency of Social Gatherings with Friends and Family:   . Attends Religious Services:   . Active Member of Clubs or Organizations:   . Attends Archivist Meetings:   Marland Kitchen Marital Status:   Intimate Partner Violence:   . Fear of Current or Ex-Partner:   . Emotionally Abused:   Marland Kitchen Physically Abused:   . Sexually Abused:      BP 134/86   Pulse 61   Ht 5\' 10"  (1.778 m)   Wt 210 lb (95.3 kg)   SpO2 95%   BMI 30.13 kg/m   Physical Exam:  Well appearing 74 yo man, NAD HEENT: Unremarkable Neck:  6 cm JVD, no thyromegally Lymphatics:  No adenopathy Back:  No CVA tenderness Lungs:  Clear with no wheezes HEART:  Regular rate rhythm, no murmurs, no rubs, no clicks Abd:  soft, positive bowel sounds, no organomegally, no rebound, no guarding Ext:  2 plus pulses, no edema, no cyanosis, no clubbing Skin:  No rashes no nodules Neuro:  CN II through XII intact, motor grossly intact  EKG - nsr with ventricular pacing  DEVICE  Normal device function.  See PaceArt for details.   Assess/Plan: 1. PAF - he is mostly maintaining NSR. He will continue dofetilide. 2. Chronic diastolic heart  failure - his symptoms are class 2. He admits to sodium indiscretion. He will start lasix 20 mg  daily. 3. HTN - his bp is minimally elevated. 4. PPM - his medtronic DDD PM has been reprogrammed to MVP.  Mikle [email or web address].D.

## 2020-04-10 ENCOUNTER — Telehealth: Payer: Self-pay | Admitting: Internal Medicine

## 2020-04-10 MED ORDER — FUROSEMIDE 20 MG PO TABS: 20.0000 mg | ORAL_TABLET | Freq: Every day | ORAL | 3 refills | Status: DC

## 2020-04-10 NOTE — Telephone Encounter (Signed)
New Message    Pt is calling and says he was suppose to have a medication sent to the pharmacy but nothing has been sent. He doesn't remember the name of the medication     Please cal back

## 2020-04-10 NOTE — Telephone Encounter (Signed)
Call placed to pharmacy  They state they did not receive prescription sent yesterday.  They requested it be resubmitted.  Resent prescription as requested  Pt notified prescription has been resent.  Advised to call back if any further issues.

## 2020-04-17 ENCOUNTER — Telehealth: Payer: Self-pay | Admitting: Internal Medicine

## 2020-04-17 NOTE — Telephone Encounter (Signed)
Martin Rubio is calling stating he has been experiencing excessive fatigue that started sometime last week. He states he didn't know if it could be due to a medication and wanted to see what Dr. [name] advises. It is worse in the mornings and last through to the afternoon. The patient has an appointment in regards to this scheduled for Friday 04/19/20 with Renee. Please advise.

## 2020-04-17 NOTE — Telephone Encounter (Signed)
Returned call to patient. Advised of Dr. Tanna Furry recommendations per MyChart message from [place], [place]. Pt verbalizes understanding and agreement with plan.

## 2020-04-18 ENCOUNTER — Other Ambulatory Visit: Payer: Medicare Other

## 2020-04-18 NOTE — Progress Notes (Signed)
Cardiology Office Note Date:  04/19/2020  Patient ID:  Martin, Rubio 1946/01/20, MRN [id number] PCP:  Colon Branch, MD  Cardiologist:  Dr. [name] EP: Dr. [name]    Chief Complaint: fatigued, dizzy spells  History of Present Illness: Martin Rubio is a 74 y.o. male with history of HTN, HLD, RA, Graves's disease s/p ablation tx >> hypothyroidism, OSA w/CPAP, AFib, SN dysfunction with PPM, CKD (III).  He was hospitalized April for Tikosyn initiation, discharged 4/2/201 on 265mcg dose (renal dose).  He had his routine follow up, most recently with Dr. [name] 04/09/20, mentioned maintaining mostly SR.  Described class II HF symptoms (diastolic) and started on lasix.  His PPM programmed MVP on.  He called 5/12 with complaints of new and significant fatigue and given today's appt.  He mentions since his visit with Dr. [name] and starting the furosemide, he is particularly fatigued, weak after his medicines in the morning and has moments of dizziness as well.  He has difficulty describing the dizziness.  He denies feeling like he is going to faint, but more a sensation of being off balance, maybe a motion feeling. It is brief but recurrent, no associated with a particular position or change in position, and by mid-day or afternoon, is better and feel back to his baseline. No CP, no palpitations.   When asked about SOB, he says in fact today was the first day he walked up 15 steps and did not  Feel winded.  That for months he has had some exertional SOB.  No bleeding or signs of bleeding  He takes his losartan not as written but in communication with the HTN clinic only as needed for BP >160/90  Device information MDT dual chamber PPM, implanted 12/13/19   Past Medical History:  Diagnosis Date  . Bronchitis   . Diverticulosis   . FH: BPH (benign prostatic hypertrophy)   . History of Graves' disease 01/19/2018   S/p ablation  . HTN (hypertension)   . Hyperlipidemia   . Obesity     . Psoriatic arthritis ([place])   . Rheumatoid arthritis ([place]) 08/07/2014  . Sleep apnea    CPAP  . Thyroid disease     Past Surgical History:  Procedure Laterality Date  . INGUINAL HERNIA REPAIR     bilateral  . KNEE SURGERY     bilateral arthroscopic  . PACEMAKER IMPLANT N/A 12/13/2019   Procedure: PACEMAKER IMPLANT;  Surgeon: Evans Lance, MD;  Location: [facility];  Service: Cardiovascular;  Laterality: N/A;  . TRANSURETHRAL RESECTION OF PROSTATE    . UMBILICAL HERNIA REPAIR      Current Outpatient Medications  Medication Sig Dispense Refill  . acetaminophen (TYLENOL) 325 MG tablet Take 650 mg by mouth every 6 (six) hours as needed for mild pain.    Marland Kitchen alendronate (FOSAMAX) 70 MG tablet Take 1 tablet (70 mg total) by mouth once a week. Take with a full glass of water on an empty stomach. 12 tablet 3  . Alirocumab (PRALUENT) 150 MG/ML SOAJ Inject 150 mg into the skin every 14 (fourteen) days. 6 pen 3  . apixaban (ELIQUIS) 5 MG TABS tablet Take 1 tablet (5 mg total) by mouth 2 (two) times daily. 180 tablet 3  . Ascorbic Acid (VITAMIN C ADULT GUMMIES PO) Take by mouth. 2 gummies daily    . Cholecalciferol (VITAMIN D) 125 MCG (5000 UT) CAPS Take 5,000 Units by mouth daily.     Marland Kitchen  Cyanocobalamin (VITAMIN B 12) 500 MCG TABS Take 1,000 mcg by mouth daily.     Marland Kitchen dofetilide (TIKOSYN) 250 MCG capsule Take 1 capsule (250 mcg total) by mouth 2 (two) times daily. 60 capsule 6  . fluticasone (FLONASE) 50 MCG/ACT nasal spray Place 1 spray into both nostrils as needed for allergies.     . furosemide (LASIX) 20 MG tablet Take 1 tablet (20 mg total) by mouth daily. 90 tablet 3  . levothyroxine (SYNTHROID) 125 MCG tablet Take 1 tablet (125 mcg total) by mouth daily before breakfast. 90 tablet 1  . losartan (COZAAR) 50 MG tablet Take 1 tablet (50 mg total) by mouth 2 (two) times daily. (Patient taking differently: Take 50 mg by mouth 2 (two) times daily as needed (Blood pressure). ) 180 tablet 1   . Melatonin 5 MG TABS Take 5 mg by mouth at bedtime.    . pantoprazole (PROTONIX) 40 MG tablet Take 40 mg by mouth daily.    . sildenafil (REVATIO) 20 MG tablet Take 60-100 mg by mouth daily as needed for erectile dysfunction.    . tamsulosin (FLOMAX) 0.4 MG CAPS capsule Take 0.4 mg by mouth at bedtime.     . triamcinolone cream (KENALOG) 0.1 % Apply 1 application topically as needed for itching.    Marland Kitchen Ustekinumab (STELARA [place]) Inject into the skin. Per Rheumatology -every 3 months    . zinc gluconate 50 MG tablet Take 50 mg by mouth at bedtime.     No current facility-administered medications for this visit.    Allergies:   Codeine, Nsaids, Prednisone, Statins, and Sulfa antibiotics   Social History:  The patient  reports that he has quit smoking. His smoking use included cigarettes. He has never used smokeless tobacco. He reports that he does not drink alcohol or use drugs.   Family History:  The patient's family history includes CAD (age of onset: 33) in his mother; CAD (age of onset: 34) in his father; Diabetes in his brother.  ROS:  Please see the history of present illness.    All other systems are reviewed and otherwise negative.   PHYSICAL EXAM:  VS:  BP 136/76   Pulse 60   Ht 5\' 10"  (1.778 m)   Wt 210 lb (95.3 kg)   BMI 30.13 kg/m  BMI: Body mass index is 30.13 kg/m. Well nourished, well developed, in no acute distress  HEENT: normocephalic, atraumatic  Neck: no JVD, carotid bruits or masses Cardiac:  RRR; no significant murmurs, no rubs, or gallops Lungs:  CTA b/l, no wheezing, rhonchi or rales  Abd: soft, nontender MS: no deformity or atrophy Ext: no edema  Skin: warm and dry, no rash Neuro:  No gross deficits appreciated Psych: euthymic mood, full affect  PPM site is stable, no tethering or discomfort   EKG:  Done today and reviewed by myself shows AP/VS, PR 26ms, QT 431ms, QTc 486ms  PPM interrogation done today and reviewed by myself:  Battery and lead  measurements are good He has had 2 NSVT episodes, they are MMVT, 3 and 4 seconds (not new for him by prior interrogations) No AF AP 57.4 VP 17.5% MVP on   12/16/19: TTE IMPRESSIONS  1. Left ventricular ejection fraction, by visual estimation, is 55 to  60%. The left ventricle has normal function. There is no increased left  ventricular wall thickness.  2. Left ventricular diastolic parameters are consistent with Grade I  diastolic dysfunction (impaired relaxation).  3. The left  ventricle has no regional wall motion abnormalities.  4. Global right ventricle has normal systolc function.The right  ventricular size is normal. no increase in right ventricular wall  thickness.  5. The mitral valve is normal in structure. No evidence of mitral valve  regurgitation. No evidence of mitral stenosis.  6. The tricuspid valve was normal in structure. Tricuspid valve  regurgitation is not demonstrated.  7. Tricuspid valve regurgitation is not demonstrated.  8. Mild aortic valve sclerosis without stenosis.  9. The pulmonic valve was normal in structure.  10. The inferior vena cava is normal in size with greater than 50%  respiratory variability, suggesting right atrial pressure of 3 mmHg.  Recent Labs: 04/19/2020: BUN 20; Creatinine, Ser 1.57; Hemoglobin 13.3; Magnesium 2.1; Platelets 229; Potassium 4.5; Sodium 140; TSH 1.670  01/30/2020: Chol/HDL Ratio 3.6; Cholesterol, Total 142; HDL 40; LDL Chol Calc (NIH) 72; Triglycerides 174   Estimated Creatinine Clearance: 47.1 mL/min (A) (by C-G formula based on SCr of 1.57 mg/dL (H)).   Wt Readings from Last 3 Encounters:  04/19/20 210 lb (95.3 kg)  04/09/20 210 lb (95.3 kg)  03/14/20 218 lb 9.6 oz (99.2 kg)     Other studies reviewed: Additional studies/records reviewed today include: summarized above  ASSESSMENT AND PLAN:  1. Paroxysmal Afib     CHA2DS2Vasc is 3 (4 given diastolic dysfunction), on Eliquis, appropriately dosed      Tikosyn, stable QT      0% burden on Tikosyn  2. PPM     Intact function, no programming changes made      Interestingly today while here he mentioned a slight sensation of his dizziness, he was AP/VS 60's While I was checking V thresholds AP/VP at 90 he had an awareness of this with a feeling he described as similar to home, though more of a lightheaded or draining sensation I did thresholds at AV pacing at 75 he again was aware of this as well I stopped testing and observed intermittently he had the sensation again, with either AS/VS 60s or AP/VS 60's, I checked his BP 130-140/60's  When he was here recently he was AS/VP without the symptom VP though seemed to reliably make him aware of a symptom, he is programmed MVP so should pace infrequently Though I am not sure why now he has this  For now, the lasix seems the only change that correlates with onset of his fatigue and dizziness Labs today Will hold it and asked him to monitor how he feels.  If no better or worse to let us know Otherwise will see him back in 2 weeks to revisit He did have resolution of his DOE with the lasix.  3. Diastolic dysfunction/CHF     No exam findings to suggest volume OL     ? If perhaps a little dry with addition of lasix  4. HTN      Looks OK   5. NSVT     Was MMVT and looks like he has had in the past     Check lytes today, QT is only 46ms  Disposition: F/u as above  Current medicines are reviewed at length with the patient today.  The patient did not have any concerns regarding medicines.  Venetia Night, PA-C 04/19/2020 6:21 PM     [address] [place] [id number] [phone] (office)  [phone] (fax)

## 2020-04-19 ENCOUNTER — Other Ambulatory Visit: Payer: Self-pay

## 2020-04-19 ENCOUNTER — Other Ambulatory Visit: Payer: Self-pay | Admitting: Physician Assistant

## 2020-04-19 ENCOUNTER — Other Ambulatory Visit: Payer: Medicare Other | Admitting: *Deleted

## 2020-04-19 ENCOUNTER — Ambulatory Visit (INDEPENDENT_AMBULATORY_CARE_PROVIDER_SITE_OTHER): Payer: Medicare Other | Admitting: Physician Assistant

## 2020-04-19 VITALS — BP 136/76 | HR 60 | Ht 70.0 in | Wt 210.0 lb

## 2020-04-19 DIAGNOSIS — I472 Ventricular tachycardia: Secondary | ICD-10-CM

## 2020-04-19 DIAGNOSIS — Z79899 Other long term (current) drug therapy: Secondary | ICD-10-CM | POA: Diagnosis not present

## 2020-04-19 DIAGNOSIS — I5032 Chronic diastolic (congestive) heart failure: Secondary | ICD-10-CM | POA: Diagnosis not present

## 2020-04-19 DIAGNOSIS — I10 Essential (primary) hypertension: Secondary | ICD-10-CM

## 2020-04-19 DIAGNOSIS — I480 Paroxysmal atrial fibrillation: Secondary | ICD-10-CM | POA: Diagnosis not present

## 2020-04-19 DIAGNOSIS — I4729 Other ventricular tachycardia: Secondary | ICD-10-CM

## 2020-04-19 LAB — BASIC METABOLIC PANEL
BUN/Creatinine Ratio: 13 (ref 10–24)
BUN: 20 mg/dL (ref 8–27)
CO2: 27 mmol/L (ref 20–29)
Calcium: 9.6 mg/dL (ref 8.6–10.2)
Chloride: 102 mmol/L (ref 96–106)
Creatinine, Ser: 1.57 mg/dL — ABNORMAL HIGH (ref 0.76–1.27)
GFR calc Af Amer: 49 mL/min/{1.73_m2} — ABNORMAL LOW (ref >59)
GFR calc non Af Amer: 42 mL/min/{1.73_m2} — ABNORMAL LOW (ref >59)
Glucose: 96 mg/dL (ref 65–99)
Potassium: 4.5 mmol/L (ref 3.5–5.2)
Sodium: 140 mmol/L (ref 134–144)

## 2020-04-19 LAB — CBC
Hematocrit: 41.3 % (ref 37.5–51.0)
Hemoglobin: 13.3 g/dL (ref 13.0–17.7)
MCH: 22.9 pg — ABNORMAL LOW (ref 26.6–33.0)
MCHC: 32.2 g/dL (ref 31.5–35.7)
MCV: 71 fL — ABNORMAL LOW (ref 79–97)
Platelets: 229 10*3/uL (ref 150–450)
RBC: 5.82 x10E6/uL — ABNORMAL HIGH (ref 4.14–5.80)
RDW: 19.1 % — ABNORMAL HIGH (ref 11.6–15.4)
WBC: 6.8 10*3/uL (ref 3.4–10.8)

## 2020-04-19 LAB — TSH: TSH: 1.67 u[IU]/mL (ref 0.450–4.500)

## 2020-04-19 LAB — MAGNESIUM: Magnesium: 2.1 mg/dL (ref 1.6–2.3)

## 2020-04-19 NOTE — Patient Instructions (Addendum)
Medication Instructions:    HOLD LASIX  20 MG UNTIL FURTHER NOTICE TO RESUME BACK TAKING   *If you need a refill on your cardiac medications before your next appointment, please call your pharmacy*   Lab Work: BMET MAG CBC AND TSH TODAY   If you have labs (blood work) drawn today and your tests are completely normal, you will receive your results only by: Marland Kitchen MyChart Message (if you have MyChart) OR . A paper copy in the mail If you have any lab test that is abnormal or we need to change your treatment, we will call you to review the results.   Testing/Procedures: NONE ORDERED  TODAY    Follow-Up: At [facility], you and your health needs are our priority.  As part of our continuing mission to provide you with exceptional heart care, we have created designated Provider Care Teams.  These Care Teams include your primary Cardiologist (physician) and Advanced Practice Providers (APPs -  Physician Assistants and Nurse Practitioners) who all work together to provide you with the care you need, when you need it.  We recommend signing up for the patient portal called "MyChart".  Sign up information is provided on this After Visit Summary.  MyChart is used to connect with patients for Virtual Visits (Telemedicine).  Patients are able to view lab/test results, encounter notes, upcoming appointments, etc.  Non-urgent messages can be sent to your provider as well.   To learn more about what you can do with MyChart, go to [email or web address].    Your next appointment:   2 week(s)  The format for your next appointment:   In Person  Provider:   You may see      Tommye Standard, PA-C  [facility] [place] "[place], [place]    Other Instructions

## 2020-04-24 ENCOUNTER — Telehealth: Payer: Self-pay | Admitting: Internal Medicine

## 2020-04-24 DIAGNOSIS — S3992XA Unspecified injury of lower back, initial encounter: Secondary | ICD-10-CM

## 2020-04-24 NOTE — Telephone Encounter (Signed)
Referral placed.

## 2020-04-24 NOTE — Telephone Encounter (Signed)
Caller : Martin Rubio   Call Back # [phone]  Subject : Referral   Patient states that he injured back while lifting a mower out of car. Patient states that he tired stretching and back hasn't improved . Patient is requesting a referral to [facility] , [phone]).

## 2020-04-25 ENCOUNTER — Ambulatory Visit (INDEPENDENT_AMBULATORY_CARE_PROVIDER_SITE_OTHER): Payer: Medicare Other | Admitting: Family Medicine

## 2020-04-25 ENCOUNTER — Other Ambulatory Visit: Payer: Self-pay

## 2020-04-25 ENCOUNTER — Encounter: Payer: Self-pay | Admitting: Family Medicine

## 2020-04-25 ENCOUNTER — Ambulatory Visit (HOSPITAL_BASED_OUTPATIENT_CLINIC_OR_DEPARTMENT_OTHER)
Admission: RE | Admit: 2020-04-25 | Discharge: 2020-04-25 | Disposition: A | Discharge status: Home / Self Care | Payer: Medicare Other | Source: Ambulatory Visit | Attending: Family Medicine | Admitting: Family Medicine

## 2020-04-25 VITALS — BP 129/68 | HR 63 | Ht 70.0 in | Wt 210.0 lb

## 2020-04-25 DIAGNOSIS — M545 Low back pain, unspecified: Secondary | ICD-10-CM

## 2020-04-25 DIAGNOSIS — M546 Pain in thoracic spine: Secondary | ICD-10-CM | POA: Diagnosis not present

## 2020-04-25 DIAGNOSIS — S299XXA Unspecified injury of thorax, initial encounter: Secondary | ICD-10-CM | POA: Diagnosis not present

## 2020-04-25 NOTE — Assessment & Plan Note (Signed)
Pain occurred after an injury at lifting a lawnmower.  Seems tSpasm and strain related.  Want to confirm not related to a compression fracture. -Counseled on home exercise therapy and supportive care. -Lumbar and thoracic x-ray. -Could consider prednisone or baclofen. -Could consider physical therapy.

## 2020-04-25 NOTE — Progress Notes (Signed)
Martin Rubio - 74 y.o. male MRN [id number]  Date of birth: [birth year]  SUBJECTIVE:  Including CC & ROS.  Chief Complaint  Patient presents with  . Back Pain    right-sided mid to low back    Martin Rubio is a 74 y.o. male that is presenting with right-sided back pain.  He was unloading a lawnmower out of his car and felt pain shortly after.  It is occurring in the right thoracic region and extends down to the lumbar.  It has persisted despite trying different exercises and techniques.  Denies any radicular symptoms.  It is worse with transitioning from sitting to standing..   Review of Systems See HPI   HISTORY: Past Medical, Surgical, Social, and Family History Reviewed & Updated per EMR.   Pertinent Historical Findings include:  Past Medical History:  Diagnosis Date  . Bronchitis   . Diverticulosis   . FH: BPH (benign prostatic hypertrophy)   . History of Graves' disease 01/19/2018   S/p ablation  . HTN (hypertension)   . Hyperlipidemia   . Obesity   . Psoriatic arthritis ([place])   . Rheumatoid arthritis ([place]) 08/07/2014  . Sleep apnea    CPAP  . Thyroid disease     Past Surgical History:  Procedure Laterality Date  . INGUINAL HERNIA REPAIR     bilateral  . KNEE SURGERY     bilateral arthroscopic  . PACEMAKER IMPLANT N/A 12/13/2019   Procedure: PACEMAKER IMPLANT;  Surgeon: Evans Lance, MD;  Location: [facility];  Service: Cardiovascular;  Laterality: N/A;  . TRANSURETHRAL RESECTION OF PROSTATE    . UMBILICAL HERNIA REPAIR      Family History  Problem Relation Age of Onset  . CAD Father 53       Died age 24  . CAD Mother 72       CABG  . Diabetes Brother     Social History   Socioeconomic History  . Marital status: Married    Spouse name: Not on file  . Number of children: 2  . Years of education: Not on file  . Highest education level: Not on file  Occupational History  . Occupation: Retired    Fish farm manager: [employer]  Tobacco Use  .  Smoking status: Former Smoker    Types: Cigarettes  . Smokeless tobacco: Never Used  . Tobacco comment: Quit 30 years ago.  Substance and Sexual Activity  . Alcohol use: No  . Drug use: No  . Sexual activity: Not on file  Other Topics Concern  . Not on file  Social History Narrative   Lives with wife.   Social Determinants of Health   Financial Resource Strain:   . Difficulty of Paying Living Expenses:   Food Insecurity:   . Worried About Charity fundraiser in the Last Year:   . Arboriculturist in the Last Year:   Transportation Needs:   . Film/video editor (Medical):   Marland Kitchen Lack of Transportation (Non-Medical):   Physical Activity:   . Days of Exercise per Week:   . Minutes of Exercise per Session:   Stress:   . Feeling of Stress :   Social Connections:   . Frequency of Communication with Friends and Family:   . Frequency of Social Gatherings with Friends and Family:   . Attends Religious Services:   . Active Member of Clubs or Organizations:   . Attends Archivist Meetings:   .  Marital Status:   Intimate Partner Violence:   . Fear of Current or Ex-Partner:   . Emotionally Abused:   Marland Kitchen Physically Abused:   . Sexually Abused:      PHYSICAL EXAM:  VS: BP 129/68   Pulse 63   Ht 5\' 10"  (1.778 m)   Wt 210 lb (95.3 kg)   BMI 30.13 kg/m  Physical Exam Gen: NAD, alert, cooperative with exam, well-appearing MSK:  Back: Tenderness palpation of the paraspinal right-sided muscles in the thoracic to lumbar. No tenderness over the midline thoracic or lumbar region. Normal strength resistance with hip flexion. Normal internal and external rotation. Negative straight leg raise. Neurovascularly intact     ASSESSMENT & PLAN:   Acute right-sided low back pain without sciatica Pain occurred after an injury at lifting a lawnmower.  Seems tSpasm and strain related.  Want to confirm not related to a compression fracture. -Counseled on home exercise therapy  and supportive care. -Lumbar and thoracic x-ray. -Could consider prednisone or baclofen. -Could consider physical therapy.

## 2020-04-25 NOTE — Patient Instructions (Signed)
Nice to meet you Please try the exercises  Please try heat  I will call with the results from today   Please send me a message in [facility] with any questions or updates.  Please see me back in 4 weeks.   --Dr. Raeford Razor

## 2020-04-26 ENCOUNTER — Telehealth: Payer: Self-pay | Admitting: Family Medicine

## 2020-04-26 MED ORDER — BACLOFEN 10 MG PO TABS
5.0000 mg | ORAL_TABLET | Freq: Two times a day (BID) | ORAL | 1 refills | Status: DC | PRN
Start: 2020-04-26 — End: 2020-09-09

## 2020-04-26 NOTE — Telephone Encounter (Signed)
Try baclofen.   Rosemarie Ax, MD [facility] 04/26/2020, 5:25 PM

## 2020-04-26 NOTE — Telephone Encounter (Signed)
Patient requesting prescription for muscle relaxers to be sent to [facility] on [address]. Patient states this was discussed at visit yesterday

## 2020-04-27 NOTE — Progress Notes (Addendum)
Cardiology Office Note   Date:  04/29/2020   ID:  Offie, Waide 1946/07/26, MRN [id number]  PCP:  Colon Branch, MD  Cardiologist:   Minus Breeding, MD   No chief complaint on file.     History of Present Illness: Martin Rubio is a 74 y.o. male who presents for follow up of HTN.  His last echo in Nov of 2016 demonstrated no septal hypertrophy.  He has had atrial fib/flutter paroxysmally with slow ventricular rates.  He had pacemaker placement.   He continued to have PAF.  He was started on Tikosyn.   He had zero present on atrial fib.  He had continued dizziness.  I did review EP notes.  Despite multiple device settings to attempt to control some of his dizziness, he had no significant improvement in these symptoms.    Since I saw him he hurt his back lifting a lawnmower.  He is still getting dyspneic with exertion.  He is not describing palpitations, presyncope or syncope.  He is not having any chest pressure, neck or arm discomfort.  He still very fatigued which limits his activity.  He has not been exercising as his gym was closed and he did not want to go back.  Past Medical History:  Diagnosis Date  . Bronchitis   . Diverticulosis   . FH: BPH (benign prostatic hypertrophy)   . History of Graves' disease 01/19/2018   S/p ablation  . HTN (hypertension)   . Hyperlipidemia   . Obesity   . Psoriatic arthritis ([place])   . Rheumatoid arthritis ([place]) 08/07/2014  . Sleep apnea    CPAP  . Thyroid disease     Past Surgical History:  Procedure Laterality Date  . INGUINAL HERNIA REPAIR     bilateral  . KNEE SURGERY     bilateral arthroscopic  . PACEMAKER IMPLANT N/A 12/13/2019   Procedure: PACEMAKER IMPLANT;  Surgeon: Evans Lance, MD;  Location: [facility];  Service: Cardiovascular;  Laterality: N/A;  . TRANSURETHRAL RESECTION OF PROSTATE    . UMBILICAL HERNIA REPAIR       Current Outpatient Medications  Medication Sig Dispense Refill  . acetaminophen  (TYLENOL) 325 MG tablet Take 650 mg by mouth every 6 (six) hours as needed for mild pain.    Marland Kitchen alendronate (FOSAMAX) 70 MG tablet Take 1 tablet (70 mg total) by mouth once a week. Take with a full glass of water on an empty stomach. 12 tablet 3  . Alirocumab (PRALUENT) 150 MG/ML SOAJ Inject 150 mg into the skin every 14 (fourteen) days. 6 pen 3  . apixaban (ELIQUIS) 5 MG TABS tablet Take 1 tablet (5 mg total) by mouth 2 (two) times daily. 180 tablet 3  . Ascorbic Acid (VITAMIN C ADULT GUMMIES PO) Take by mouth. 2 gummies daily    . baclofen (LIORESAL) 10 MG tablet Take 0.5 tablets (5 mg total) by mouth 2 (two) times daily as needed for muscle spasms. 30 each 1  . Cholecalciferol (VITAMIN D) 125 MCG (5000 UT) CAPS Take 5,000 Units by mouth daily.     . Cyanocobalamin (VITAMIN B 12) 500 MCG TABS Take 1,000 mcg by mouth daily.     Marland Kitchen dofetilide (TIKOSYN) 250 MCG capsule Take 1 capsule (250 mcg total) by mouth 2 (two) times daily. 60 capsule 6  . levothyroxine (SYNTHROID) 125 MCG tablet Take 1 tablet (125 mcg total) by mouth daily before breakfast. 90 tablet 1  .  losartan (COZAAR) 50 MG tablet Take 1 tablet (50 mg total) by mouth 2 (two) times daily. (Patient taking differently: Take 50 mg by mouth 2 (two) times daily as needed (Blood pressure). ) 180 tablet 1  . Melatonin 5 MG TABS Take 5 mg by mouth at bedtime.    . pantoprazole (PROTONIX) 40 MG tablet Take 40 mg by mouth daily.    . sildenafil (REVATIO) 20 MG tablet Take 60-100 mg by mouth daily as needed for erectile dysfunction.    . tamsulosin (FLOMAX) 0.4 MG CAPS capsule Take 0.4 mg by mouth at bedtime.     . triamcinolone cream (KENALOG) 0.1 % Apply 1 application topically as needed for itching.    Marland Kitchen Ustekinumab (STELARA [place]) Inject into the skin. Per Rheumatology -every 3 months    . zinc gluconate 50 MG tablet Take 50 mg by mouth at bedtime.    . fluticasone (FLONASE) 50 MCG/ACT nasal spray Place 1 spray into both nostrils as needed for  allergies.     . furosemide (LASIX) 20 MG tablet Take 1 tablet (20 mg total) by mouth daily. (Patient not taking: Reported on 04/29/2020) 90 tablet 3   No current facility-administered medications for this visit.    Allergies:   Codeine, Nsaids, Prednisone, Statins, and Sulfa antibiotics    ROS:  Please see the history of present illness.   Otherwise, review of systems are positive for back pain, joint pain.   All other systems are reviewed and negative.    PHYSICAL EXAM: VS:  BP (!) 142/86   Pulse 63   Ht 5\' 10"  (1.778 m)   Wt 213 lb 6.4 oz (96.8 kg)   SpO2 98%   BMI 30.62 kg/m  , BMI Body mass index is 30.62 kg/m. GENERAL:  Well appearing NECK:  No jugular venous distention, waveform within normal limits, carotid upstroke brisk and symmetric, no bruits, no thyromegaly LUNGS:  Clear to auscultation bilaterally CHEST:  Unremarkable HEART:  PMI not displaced or sustained,S1 and S2 within normal limits, no S3, no S4, no clicks, no rubs, 2 out of 6 apical systolic murmur early peaking, no diastolic murmurs ABD:  Flat, positive bowel sounds normal in frequency in pitch, no bruits, no rebound, no guarding, no midline pulsatile mass, no hepatomegaly, no splenomegaly EXT:  2 plus pulses throughout, no edema, no cyanosis no clubbing  EKG:  EKG is not ordered today.    Recent Labs: 04/19/2020: BUN 20; Creatinine, Ser 1.57; Hemoglobin 13.3; Magnesium 2.1; Platelets 229; Potassium 4.5; Sodium 140; TSH 1.670    Lipid Panel    Component Value Date/Time   CHOL 142 01/30/2020 1018   TRIG 174 (H) 01/30/2020 1018   HDL 40 01/30/2020 1018   CHOLHDL 3.6 01/30/2020 1018   LDLCALC 72 01/30/2020 1018      Wt Readings from Last 3 Encounters:  04/29/20 213 lb 6.4 oz (96.8 kg)  04/25/20 210 lb (95.3 kg)  04/19/20 210 lb (95.3 kg)      Other studies Reviewed: Additional studies/ records that were reviewed today include:  None Review of the above records demonstrates:  Please see  elsewhere in the note.     ASSESSMENT AND PLAN:  Atrial fib I think he has his rhythm controlled with his pacemaker and his Tikosyn.  He is following up with Dr. [name] soon.  He will continue with anticoagulation.  No change in therapy.   Pacemaker placement He is up-to-date with follow-up. I reviewed the EP records.  Left common iliac anuerysm This was stable in October of last year.  I will follow this up after his next 39-month visit.   HTN Blood pressure is very mildly elevated today but he understands to keep a diary of this and let me know if it runs higher.  No change in therapy.   Murmur He has aortic sclerosis  Dyslipidemia He is being managed he is being managed with a PCSK9 inhibitor.   Dyspnea: I am going to order a stress test.  He would not be able walk on a treadmill.  He will have a perfusion study.  Covid education:   I have suggested the vaccine but asked him to check with his primary care provider or rheumatologist because he is on Stelara.  Current medicines are reviewed at length with the patient today.  The patient does not have concerns regarding medicines.  The following changes have been made: None  Labs/ tests ordered today include:     Orders Placed This Encounter  Procedures  . MYOCARDIAL PERFUSION IMAGING     Disposition:   FU with me in six months   Signed, Minus Breeding, MD  04/29/2020 11:40 AM    [place]

## 2020-04-29 ENCOUNTER — Telehealth: Payer: Self-pay | Admitting: Family Medicine

## 2020-04-29 ENCOUNTER — Ambulatory Visit (INDEPENDENT_AMBULATORY_CARE_PROVIDER_SITE_OTHER): Payer: Medicare Other | Admitting: Cardiology

## 2020-04-29 ENCOUNTER — Encounter: Payer: Self-pay | Admitting: Cardiology

## 2020-04-29 ENCOUNTER — Other Ambulatory Visit: Payer: Self-pay

## 2020-04-29 VITALS — BP 142/86 | HR 63 | Ht 70.0 in | Wt 213.4 lb

## 2020-04-29 DIAGNOSIS — Z7189 Other specified counseling: Secondary | ICD-10-CM | POA: Diagnosis not present

## 2020-04-29 DIAGNOSIS — R0600 Dyspnea, unspecified: Secondary | ICD-10-CM | POA: Diagnosis not present

## 2020-04-29 DIAGNOSIS — Z950 Presence of cardiac pacemaker: Secondary | ICD-10-CM

## 2020-04-29 DIAGNOSIS — R0609 Other forms of dyspnea: Secondary | ICD-10-CM

## 2020-04-29 DIAGNOSIS — E785 Hyperlipidemia, unspecified: Secondary | ICD-10-CM | POA: Diagnosis not present

## 2020-04-29 DIAGNOSIS — I723 Aneurysm of iliac artery: Secondary | ICD-10-CM | POA: Diagnosis not present

## 2020-04-29 DIAGNOSIS — I480 Paroxysmal atrial fibrillation: Secondary | ICD-10-CM

## 2020-04-29 DIAGNOSIS — M545 Low back pain, unspecified: Secondary | ICD-10-CM

## 2020-04-29 NOTE — Telephone Encounter (Signed)
Spoke with patient about his ongoing pain. baclofen has limited improvement. Will send to PT.   Rosemarie Ax, MD [facility] 04/29/2020, 3:11 PM

## 2020-04-29 NOTE — Telephone Encounter (Signed)
Left VM for patient. If he calls back please have him speak with a nurse/CMA and inform that his lumbar x-ray shows good disc height but does show loss of the natural curve of the lordosis.  This could represent a spasm in the back.  The thoracic x-ray shows a mild scoliosis which can lead to an exacerbation of muscle spasms..   If any questions then please take the best time and phone number to call and I will try to call him back.   Rosemarie Ax, MD [facility] 04/29/2020, 9:46 AM

## 2020-04-29 NOTE — Patient Instructions (Signed)
Medication Instructions:  NO CHANGES *If you need a refill on your cardiac medications before your next appointment, please call your pharmacy*  Lab Work: NONE ORDERED THIS VISIT  Testing/Procedures: Your physician has requested that you have a lexiscan myoview. For further information please visit [email or web address]. Please follow instruction sheet, as given.  Follow-Up: At [facility], you and your health needs are our priority.  As part of our continuing mission to provide you with exceptional heart care, we have created designated Provider Care Teams.  These Care Teams include your primary Cardiologist (physician) and Advanced Practice Providers (APPs -  Physician Assistants and Nurse Practitioners) who all work together to provide you with the care you need, when you need it.  Your next appointment:   6 month(s) You will receive a reminder letter in the mail two months in advance. If you don't receive a letter, please call our office to schedule the follow-up appointment.  The format for your next appointment:   In Person  Provider:   Minus Breeding, MD  Other Instructions Your physician has requested that you have a lexiscan myoview. For further information please visit [email or web address]. Please follow instruction sheet, as given. This will take place at [place], [address]  How to prepare for your Myocardial Perfusion Test:  Do not eat or drink 3 hours prior to your test, except you may have water.  Do not consume products containing caffeine (regular or decaffeinated) 12 hours prior to your test. (ex: coffee, chocolate, sodas, tea).  Do bring a list of your current medications with you.  If not listed below, you may take your medications as normal.  Do wear comfortable clothes (no dresses or overalls) and walking shoes, tennis shoes preferred (No heels or open toe shoes are allowed).  Do NOT wear cologne, perfume, aftershave, or lotions (deodorant is  allowed).  The test will take approximately 3 to 4 hours to complete  If these instructions are not followed, your test will have to be rescheduled.

## 2020-05-02 NOTE — Progress Notes (Signed)
Cardiology Office Note Date:  05/03/2020  Patient ID:  Martin, Rubio 1946-03-04, MRN [id number] PCP:  Colon Branch, MD  Cardiologist:  Dr. [name] EP: Dr. [name]    Chief Complaint:  F/u off furosemide   History of Present Illness: Martin Rubio is a 74 y.o. male with history of HTN, HLD, RA, Graves's disease s/p ablation tx >> hypothyroidism, OSA w/CPAP, AFib, SN dysfunction with PPM, CKD (III).  He was hospitalized April for Tikosyn initiation, discharged 4/2/201 on 221mcg dose (renal dose).  He had his routine follow up, most recently with Dr. [name] 04/09/20, mentioned maintaining mostly SR.  Described class II HF symptoms (diastolic) and started on lasix.  His PPM programmed MVP on.  He called 5/12 with complaints of new and significant fatigue and given today's appt.  04/19/20 I saw him, he mentioned since his visit with Dr. [name] and starting the furosemide, he was particularly fatigued, weak after his medicines in the morning and has moments of dizziness as well.  He had difficulty describing the dizziness.  He denied feeling like he is going to faint, but more a sensation of being off balance, maybe a motion feeling. It is brief but recurrent, not associated with a particular position or change in position, and by mid-day or afternoon, is better and feel back to his baseline. No CP, no palpitations.   When asked about SOB, he says in fact today was the first day he walked up 15 steps and did not  Feel winded.  That for months he has had some exertional SOB. No bleeding or signs of bleeding He takes his losartan not as written but in communication with the HTN clinic only as needed for BP >160/90  Interrogation/testing his PPM seemed to reproduce his symptom when V pacing, though intermittently even with SR (AS/VS or AP/VS 60's he reported the sensation of being lightheaded with normal BP as well.  Ultimately unclear the connection with his pacer/pacing, his furosemide  stopped and labs checked. (looked OK, Creat at his baseline), H/H stable, TSH wnl.  He saw Dr. [name] 04/29/20, c/o DOE, no CP, planned for stress testing.  Had hurt his back lifting a lawn mower, felt able to walk on the treadmill.  Noted that he had quithis usual gym regime given COVID, more sedentary then had been previously.   He has had improvement in the slight dizziness/lightheadedness off the furosemide.  He continues with the same DOE.  No rest SOB.  No near syncope or syncope, no palpitations.  He has a infrequent fleeting sharp L sided CP that is not new, not positional or exertional, no pattern or associated symptoms.  No bleeding or signs of bleeding  Device information MDT dual chamber PPM, implanted 12/13/19   Past Medical History:  Diagnosis Date  . Bronchitis   . Diverticulosis   . FH: BPH (benign prostatic hypertrophy)   . History of Graves' disease 01/19/2018   S/p ablation  . HTN (hypertension)   . Hyperlipidemia   . Obesity   . Psoriatic arthritis ([place])   . Rheumatoid arthritis ([place]) 08/07/2014  . Sleep apnea    CPAP  . Thyroid disease     Past Surgical History:  Procedure Laterality Date  . INGUINAL HERNIA REPAIR     bilateral  . KNEE SURGERY     bilateral arthroscopic  . PACEMAKER IMPLANT N/A 12/13/2019   Procedure: PACEMAKER IMPLANT;  Surgeon: Evans Lance, MD;  Location: [facility];  Service: Cardiovascular;  Laterality: N/A;  . TRANSURETHRAL RESECTION OF PROSTATE    . UMBILICAL HERNIA REPAIR      Current Outpatient Medications  Medication Sig Dispense Refill  . acetaminophen (TYLENOL) 325 MG tablet Take 650 mg by mouth every 6 (six) hours as needed for mild pain.    Marland Kitchen alendronate (FOSAMAX) 70 MG tablet Take 1 tablet (70 mg total) by mouth once a week. Take with a full glass of water on an empty stomach. 12 tablet 3  . Alirocumab (PRALUENT) 150 MG/ML SOAJ Inject 150 mg into the skin every 14 (fourteen) days. 6 pen 3  . apixaban (ELIQUIS) 5  MG TABS tablet Take 1 tablet (5 mg total) by mouth 2 (two) times daily. 180 tablet 3  . Ascorbic Acid (VITAMIN C ADULT GUMMIES PO) Take by mouth. 2 gummies daily    . baclofen (LIORESAL) 10 MG tablet Take 0.5 tablets (5 mg total) by mouth 2 (two) times daily as needed for muscle spasms. 30 each 1  . Cholecalciferol (VITAMIN D) 125 MCG (5000 UT) CAPS Take 5,000 Units by mouth daily.     . Cyanocobalamin (VITAMIN B 12) 500 MCG TABS Take 1,000 mcg by mouth daily.     Marland Kitchen dofetilide (TIKOSYN) 250 MCG capsule Take 1 capsule (250 mcg total) by mouth 2 (two) times daily. 60 capsule 6  . fluticasone (FLONASE) 50 MCG/ACT nasal spray Place 1 spray into both nostrils as needed for allergies.     Marland Kitchen levothyroxine (SYNTHROID) 125 MCG tablet Take 1 tablet (125 mcg total) by mouth daily before breakfast. 90 tablet 1  . losartan (COZAAR) 50 MG tablet Take 50 mg by mouth 2 (two) times daily as needed (BLOOD PRESSURE OVER 150/90).    . Melatonin 5 MG TABS Take 5 mg by mouth at bedtime.    . pantoprazole (PROTONIX) 40 MG tablet Take 40 mg by mouth daily.    . sildenafil (REVATIO) 20 MG tablet Take 60-100 mg by mouth daily as needed for erectile dysfunction.    . tamsulosin (FLOMAX) 0.4 MG CAPS capsule Take 0.4 mg by mouth at bedtime.     . triamcinolone cream (KENALOG) 0.1 % Apply 1 application topically as needed for itching.    Marland Kitchen Ustekinumab (STELARA [place]) Inject into the skin. Per Rheumatology -every 3 months    . zinc gluconate 50 MG tablet Take 50 mg by mouth at bedtime.     No current facility-administered medications for this visit.    Allergies:   Codeine, Nsaids, Prednisone, Statins, and Sulfa antibiotics   Social History:  The patient  reports that he has quit smoking. His smoking use included cigarettes. He has never used smokeless tobacco. He reports that he does not drink alcohol or use drugs.   Family History:  The patient's family history includes CAD (age of onset: 13) in his mother; CAD (age of  onset: 68) in his father; Diabetes in his brother.  ROS:  Please see the history of present illness.    All other systems are reviewed and otherwise negative.   PHYSICAL EXAM:  VS:  BP 134/76   Pulse 66   Ht 5\' 10"  (1.778 m)   Wt 213 lb (96.6 kg)   BMI 30.56 kg/m  BMI: Body mass index is 30.56 kg/m. Well nourished, well developed, in no acute distress  HEENT: normocephalic, atraumatic  Neck: no JVD, carotid bruits or masses Cardiac:  RRR; no significant murmurs, no rubs, or gallops Lungs:  CTA b/l, no wheezing, rhonchi or rales  Abd: soft, nontender MS: no deformity or atrophy Ext: no edema  Skin: warm and dry, no rash Neuro:  No gross deficits appreciated Psych: euthymic mood, full affect  PPM site is stable, no tethering or discomfort   EKG:  Done today and reviewed by myself shows  05/03/2020: AS/VS, PR 273ms, QT 454ms, QTc 438ms  04/19/2020: AP/VS, PR 264ms, QT 484ms, QTc 451ms  PPM interrogation done today and reviewed by myself:  Battery and lead measurements are good One AF episode 56seconds No programming changes made AP 61% VP 30%   12/16/19: TTE IMPRESSIONS  1. Left ventricular ejection fraction, by visual estimation, is 55 to  60%. The left ventricle has normal function. There is no increased left  ventricular wall thickness.  2. Left ventricular diastolic parameters are consistent with Grade I  diastolic dysfunction (impaired relaxation).  3. The left ventricle has no regional wall motion abnormalities.  4. Global right ventricle has normal systolc function.The right  ventricular size is normal. no increase in right ventricular wall  thickness.  5. The mitral valve is normal in structure. No evidence of mitral valve  regurgitation. No evidence of mitral stenosis.  6. The tricuspid valve was normal in structure. Tricuspid valve  regurgitation is not demonstrated.  7. Tricuspid valve regurgitation is not demonstrated.  8. Mild aortic valve  sclerosis without stenosis.  9. The pulmonic valve was normal in structure.  10. The inferior vena cava is normal in size with greater than 50%  respiratory variability, suggesting right atrial pressure of 3 mmHg.  Recent Labs: 04/19/2020: BUN 20; Creatinine, Ser 1.57; Hemoglobin 13.3; Magnesium 2.1; Platelets 229; Potassium 4.5; Sodium 140; TSH 1.670  01/30/2020: Chol/HDL Ratio 3.6; Cholesterol, Total 142; HDL 40; LDL Chol Calc (NIH) 72; Triglycerides 174   Estimated Creatinine Clearance: 47.4 mL/min (A) (by C-G formula based on SCr of 1.57 mg/dL (H)).   Wt Readings from Last 3 Encounters:  05/03/20 213 lb (96.6 kg)  04/29/20 213 lb 6.4 oz (96.8 kg)  04/25/20 210 lb (95.3 kg)     Other studies reviewed: Additional studies/records reviewed today include: summarized above  ASSESSMENT AND PLAN:  1. Paroxysmal Afib     CHA2DS2Vasc is 3 (4 given diastolic dysfunction), on Eliquis, appropriately dosed     Tikosyn, stable QT      <0.1% burden on Tikosyn      Labs/lytes OK   2. PPM     Intact function, no programming changes made     No awareness or symptoms today with pacing       3. Diastolic dysfunction/CHF 4. DOE     No exam findings to suggest volume OL     Pending stress test via Dr.Hochrein  5. HTN      Looks OK   6. NSVT     Noted last check MMVT, he has had in the past     None since last check    Disposition: continue Q70mo remotes and see EP in 54mo, sooner if needed. He will follow up with Dr. [name] as directed by him, and his stress test results.    Current medicines are reviewed at length with the patient today.  The patient did not have any concerns regarding medicines.   Venetia Night, PA-C 05/03/2020 9:50 AM     [facility] [place] [phone] (office)  [phone] (fax)

## 2020-05-03 ENCOUNTER — Ambulatory Visit (INDEPENDENT_AMBULATORY_CARE_PROVIDER_SITE_OTHER): Payer: Medicare Other | Admitting: Physician Assistant

## 2020-05-03 ENCOUNTER — Other Ambulatory Visit: Payer: Self-pay

## 2020-05-03 ENCOUNTER — Telehealth (HOSPITAL_COMMUNITY): Payer: Self-pay

## 2020-05-03 VITALS — BP 134/76 | HR 66 | Ht 70.0 in | Wt 213.0 lb

## 2020-05-03 DIAGNOSIS — I480 Paroxysmal atrial fibrillation: Secondary | ICD-10-CM

## 2020-05-03 DIAGNOSIS — I10 Essential (primary) hypertension: Secondary | ICD-10-CM

## 2020-05-03 DIAGNOSIS — Z950 Presence of cardiac pacemaker: Secondary | ICD-10-CM

## 2020-05-03 DIAGNOSIS — I495 Sick sinus syndrome: Secondary | ICD-10-CM | POA: Diagnosis not present

## 2020-05-03 DIAGNOSIS — Z79899 Other long term (current) drug therapy: Secondary | ICD-10-CM | POA: Diagnosis not present

## 2020-05-03 NOTE — Telephone Encounter (Signed)
Encounter complete. 

## 2020-05-03 NOTE — Patient Instructions (Signed)
Medication Instructions:    Your physician recommends that you continue on your current medications as directed. Please refer to the Current Medication list given to you today.  *If you need a refill on your cardiac medications before your next appointment, please call your pharmacy*   Lab Work: NONE ORDERED  TODAY  If you have labs (blood work) drawn today and your tests are completely normal, you will receive your results only by: . MyChart Message (if you have MyChart) OR . A paper copy in the mail If you have any lab test that is abnormal or we need to change your treatment, we will call you to review the results.   Testing/Procedures: NONE ORDERED  TODAY    Follow-Up: At [facility], you and your health needs are our priority.  As part of our continuing mission to provide you with exceptional heart care, we have created designated Provider Care Teams.  These Care Teams include your primary Cardiologist (physician) and Advanced Practice Providers (APPs -  Physician Assistants and Nurse Practitioners) who all work together to provide you with the care you need, when you need it.  We recommend signing up for the patient portal called "MyChart".  Sign up information is provided on this After Visit Summary.  MyChart is used to connect with patients for Virtual Visits (Telemedicine).  Patients are able to view lab/test results, encounter notes, upcoming appointments, etc.  Non-urgent messages can be sent to your provider as well.   To learn more about what you can do with MyChart, go to [email or web address].    Your next appointment:    6 month(s)  The format for your next appointment:   In Person  Provider:   You may see Dr. Taylor or one of the following Advanced Practice Providers on your designated Care Team:    Amber Seiler, NP  Renee Ursuy, PA-C  Michael "Andy" Tillery, PA-C    Other Instructions   

## 2020-05-07 ENCOUNTER — Other Ambulatory Visit: Payer: Self-pay

## 2020-05-07 ENCOUNTER — Ambulatory Visit (HOSPITAL_COMMUNITY)
Admission: RE | Admit: 2020-05-07 | Discharge: 2020-05-07 | Disposition: A | Discharge status: Home / Self Care | Payer: Medicare Other | Source: Ambulatory Visit | Attending: Cardiology | Admitting: Cardiology

## 2020-05-07 DIAGNOSIS — R0600 Dyspnea, unspecified: Secondary | ICD-10-CM | POA: Insufficient documentation

## 2020-05-07 LAB — MYOCARDIAL PERFUSION IMAGING
LV dias vol: 97 mL (ref 62–150)
LV sys vol: 35 mL
Peak HR: 72 {beats}/min
Rest HR: 60 {beats}/min
SDS: 1
SRS: 0
SSS: 1
TID: 0.99

## 2020-05-07 MED ORDER — REGADENOSON 0.4 MG/5ML IV SOLN
0.4000 mg | Freq: Once | INTRAVENOUS | Status: AC
  Administered 2020-05-07: 0.4 mg via INTRAVENOUS

## 2020-05-07 MED ORDER — TECHNETIUM TC 99M TETROFOSMIN IV KIT
10.1000 | PACK | Freq: Once | INTRAVENOUS | Status: AC | PRN
  Administered 2020-05-07: 10.1 via INTRAVENOUS
  Filled 2020-05-07: qty 11

## 2020-05-07 MED ORDER — TECHNETIUM TC 99M TETROFOSMIN IV KIT
30.9000 | PACK | Freq: Once | INTRAVENOUS | Status: AC | PRN
  Administered 2020-05-07: 30.9 via INTRAVENOUS
  Filled 2020-05-07: qty 31

## 2020-05-07 MED ORDER — AMINOPHYLLINE 25 MG/ML IV SOLN
75.0000 mg | Freq: Once | INTRAVENOUS | Status: AC
Start: 2020-05-07 — End: 2020-05-07
  Administered 2020-05-07: 75 mg via INTRAVENOUS

## 2020-05-09 ENCOUNTER — Other Ambulatory Visit: Payer: Self-pay

## 2020-05-09 ENCOUNTER — Ambulatory Visit: Payer: Medicare Other | Attending: Family Medicine | Admitting: Physical Therapy

## 2020-05-09 ENCOUNTER — Encounter: Payer: Self-pay | Admitting: Physical Therapy

## 2020-05-09 VITALS — BP 110/60 | HR 72

## 2020-05-09 DIAGNOSIS — M62830 Muscle spasm of back: Secondary | ICD-10-CM

## 2020-05-09 DIAGNOSIS — M546 Pain in thoracic spine: Secondary | ICD-10-CM

## 2020-05-09 DIAGNOSIS — R29898 Other symptoms and signs involving the musculoskeletal system: Secondary | ICD-10-CM | POA: Diagnosis present

## 2020-05-09 DIAGNOSIS — R293 Abnormal posture: Secondary | ICD-10-CM

## 2020-05-09 NOTE — Therapy (Signed)
[facility] [address]  [place], [place], [address] Phone: [phone]   Fax:  [phone]  Physical Therapy Evaluation  Patient Details  Name: Martin Rubio MRN: [id number] Date of Birth: 08-23-1946 Referring Provider (PT): Clearance Coots, MD   Encounter Date: 05/09/2020  PT End of Session - 05/09/20 1122    Visit Number  1    Number of Visits  10    Date for PT Re-Evaluation  06/20/20    Authorization Type  Medicare & [employer]    PT Start Time  [phone]    PT Stop Time  1018    PT Time Calculation (min)  44 min    Activity Tolerance  Patient tolerated treatment well;Patient limited by pain    Behavior During Therapy  [facility] for tasks assessed/performed       Past Medical History:  Diagnosis Date  . Bronchitis   . Diverticulosis   . FH: BPH (benign prostatic hypertrophy)   . History of Graves' disease 01/19/2018   S/p ablation  . HTN (hypertension)   . Hyperlipidemia   . Obesity   . Psoriatic arthritis ([place])   . Rheumatoid arthritis ([place]) 08/07/2014  . Sleep apnea    CPAP  . Thyroid disease     Past Surgical History:  Procedure Laterality Date  . INGUINAL HERNIA REPAIR     bilateral  . KNEE SURGERY     bilateral arthroscopic  . PACEMAKER IMPLANT N/A 12/13/2019   Procedure: PACEMAKER IMPLANT;  Surgeon: Evans Lance, MD;  Location: [facility];  Service: Cardiovascular;  Laterality: N/A;  . TRANSURETHRAL RESECTION OF PROSTATE    . UMBILICAL HERNIA REPAIR      Vitals:   05/09/20 0950  BP: 110/60  Pulse: 72  SpO2: 98%     Subjective Assessment - 05/09/20 0936    Subjective  Patient notes that he has been having problems with SOB since that start of 2021 and had a pacemaker inserted around that time d/t bradycardia. Has been getting cardiac and GI tests to assess the cause of his SOB. Back pain began as a result of trying to lift a lawnmower while twisting about a month ago. Pain is located  over the R midback. Denies N/T or radiation. Worse when sitting down and twisting to the L. Sometimes will also intermittently have hip pain. Better with meds and icy hot.    Pertinent History  pacemaker, grave's disease, RA, psoriatic arthritis, HLD, HTN, B arthroscopic knee surgery    Limitations  Sitting;Lifting;House hold activities    How long can you sit comfortably?  unlimited    How long can you stand comfortably?  30 min    How long can you walk comfortably?  unsure d/t SOB    Diagnostic tests  04/25/20 lumbar xray: Mild degenerative disc disease is noted at L3-4. No acuteabnormality seen in the lumbar spine.; 04/25/20 thoracic xray: No acute abnormality seen in the thoracic spine. Multilevel degenerative disc disease is noted.    Patient Stated Goals  get rid of pain    Currently in Pain?  No/denies    Pain Score  0-No pain    Pain Location  Back    Pain Orientation  Right    Pain Descriptors / Indicators  Sharp    Pain Type  Acute pain         [facility] PT Assessment - 05/09/20 0944      Assessment  Medical Diagnosis  Acute R sided- LBP without sciatica     Referring Provider (PT)  Clearance Coots, MD    Onset Date/Surgical Date  04/08/20    Hand Dominance  Right    Next MD Visit  05/24/20    Prior Therapy  yes       Precautions   Precautions  ICD/Pacemaker      Balance Screen   Has the patient fallen in the past 6 months  No    Has the patient had a decrease in activity level because of a fear of falling?   No    Is the patient reluctant to leave their home because of a fear of falling?   No      Home Film/video editor residence    Living Arrangements  Spouse/significant other    Available Help at Discharge  Family    Type of [place] to enter    Entrance Stairs-Number of Steps  2    Entrance Stairs-Rails  None    Home Layout  Multi-level   split level   Alternate Level Stairs-Number of Steps  8    Alternate  Level Stairs-Rails  Right      Prior Function   Level of Independence  Independent    Vocation  Retired    Leisure  walking with wife      Cognition   Overall Cognitive Status  Within Functional Limits for tasks assessed      Sensation   Light Touch  Appears Intact      Coordination   Gross Motor Movements are Fluid and Coordinated  Yes      Posture/Postural Control   Posture/Postural Control  Postural limitations    Postural Limitations  Rounded Shoulders;Forward head;Increased thoracic kyphosis      ROM / Strength   AROM / PROM / Strength  AROM;Strength      AROM   Overall AROM Comments  B shoulder AROM WFL, pain in R shoulder with flexion and abduction, pain in L shoulder with IR    AROM Assessment Site  Shoulder;Thoracic    Right/Left Shoulder  Right;Left    Thoracic Flexion  mildly limited   mild midback pain    Thoracic Extension  severely limited   mild pain in LB   Thoracic - Right Side Bend  mildly limited   mild pain in midback   Thoracic - Left Side Bend  [facility]    Thoracic - Right Rotation  moderately limited   mild pain in midback   Thoracic - Left Rotation  moderately limited   mild pain in midback     Strength   Strength Assessment Site  Shoulder    Right/Left Shoulder  Right;Left    Right Shoulder Flexion  4+/5    Right Shoulder ABduction  4/5    Right Shoulder Internal Rotation  4+/5    Right Shoulder External Rotation  4/5    Left Shoulder Flexion  4/5    Left Shoulder ABduction  4+/5    Left Shoulder Internal Rotation  4/5    Left Shoulder External Rotation  4/5      Palpation   Spinal mobility  very TTP and hypomobile with gentle central PAs over thoracic and upper lumbar spine; mildly tender with PAs of 10th rib    Palpation comment  large area of increased soft tissue restriction over R lats and infraspinatus insertion;  TTP over R UT, and with palpation midline of T10 and posterior aspect of R 10th rib                  Objective  measurements completed on examination: See above findings.              PT Education - 05/09/20 1122    Education Details  prognosis, POC, HEP    Person(s) Educated  Patient    Methods  Explanation;Demonstration;Tactile cues;Verbal cues;Handout    Comprehension  Verbalized understanding;Returned demonstration       PT Short Term Goals - 05/09/20 1130      PT SHORT TERM GOAL #1   Title  Pt will be independent with initial HEP.    Time  3    Period  Weeks    Status  New    Target Date  05/30/20        PT Long Term Goals - 05/09/20 1130      PT LONG TERM GOAL #1   Title  Pt will be independent with advanced HEP.    Time  6    Period  Weeks    Status  New    Target Date  06/20/20      PT LONG TERM GOAL #2   Title  Pt will demonstrate proper body mechanics to lift 20lb box to avoid further injury.    Time  6    Period  Weeks    Status  New    Target Date  06/20/20      PT LONG TERM GOAL #3   Title  Patient to demonstrate thoracic AROM WFL and without pain limiting.    Time  6    Period  Weeks    Status  New    Target Date  06/20/20      PT LONG TERM GOAL #4   Title  Patient to recall/demonstrate postural correction at rest and with activity to improve postural awareness.    Time  6    Period  Weeks    Status  New    Target Date  06/20/20      PT LONG TERM GOAL #5   Title  Patient to report 75% improvement in pain.    Time  6    Period  Weeks    Status  New    Target Date  06/20/20             Plan - 05/09/20 1123    Clinical Impression Statement  Patient is a 73y/o M presenting to OPPT with c/o R sided midback pain of 1 month duration since trying to lift a lawnmower while twisting. Referral received indicates R sided LBP, however patient pointing t the midback region today, thus this area of pain was assessed. Patient denies N/T or radiation, but does deal with multiple areas of pain such as B shoulders and R hip which he mentions today.  Midback pain is worse when transferring to sit and twisting to the L. Patient also notes that he has been struggling with SOB for the past 6 months and this is being addressed by his MD's- no similar complaints today. Patient today presenting with kyphotic and forward head posture, limited and painful thoracic AROM, painful B shoulder AROM and decreased shoulder strength, tenderness and hypomobility throughout thoracic and upper lumbar spine with central PAs as well as tenderness with PAs over posterior 10th rib, and soft tissue restriction over R lats and  infraspinatus insertion. Patient was educated on gentle stretching and postural correction HEP and reported understanding. Would benefit from skilled PT services 2x/week for 3 weeks followed by 1x/week for 3 weeks to address aforementioned impairments.    Personal Factors and Comorbidities  Age;Sex;Comorbidity 3+;Fitness;Past/Current Experience;Time since onset of injury/illness/exacerbation    Comorbidities  pacemaker, grave's disease, RA, psoriatic arthritis, HLD, HTN, B arthroscopic knee surgery    Examination-Activity Limitations  Sit;Bend;Squat;Carry;Stand;Stairs;Transfers;Dressing;Lift;Locomotion Level;Reach Overhead    Examination-Participation Restrictions  Church;Cleaning;Shop;Community Activity;Driving;Yard Work;Laundry;Meal Prep    Stability/Clinical Decision Making  Stable/Uncomplicated    Clinical Decision Making  Low    Rehab Potential  Good    PT Frequency  Other (comment)   2x/week for 3 weeks followed by 1x/week for 3 weeks   PT Treatment/Interventions  ADLs/Self Care Home Management;Cryotherapy;Moist Heat;Balance training;Therapeutic exercise;Therapeutic activities;Functional mobility training;Stair training;Gait training;Neuromuscular re-education;Patient/family education;Manual techniques;Taping;Energy conservation;Dry needling;Passive range of motion    PT Next Visit Plan  thoracic FOTO; reassess HEP; central spinal PAs and STM to R  lats, postural correction ther-ex    Consulted and Agree with Plan of Care  Patient       Patient will benefit from skilled therapeutic intervention in order to improve the following deficits and impairments:  Hypomobility, Decreased activity tolerance, Decreased strength, Increased fascial restricitons, Impaired UE functional use, Pain, Difficulty walking, Increased muscle spasms, Cardiopulmonary status limiting activity, Improper body mechanics, Decreased range of motion, Impaired flexibility, Postural dysfunction  Visit Diagnosis: Pain in thoracic spine  Muscle spasm of back  Abnormal posture  Other symptoms and signs involving the musculoskeletal system     Problem List Patient Active Problem List   Diagnosis Date Noted  . Acute right-sided low back pain without sciatica 04/25/2020  . Secondary hypercoagulable state ([place]) 03/05/2020  . Educated about COVID-19 virus infection 01/27/2020  . PCP notes >>>>>>>>>>>>>>> 01/10/2020  . Age related osteoporosis 01/10/2020  . CKD (chronic kidney disease), stage IV ([place]) 12/16/2019  . Pacemaker 12/16/2019  . Sick sinus syndrome ([place]) 12/16/2019  . Chest pain 12/15/2019  . Paroxysmal atrial fibrillation ([place]) 10/26/2019  . Dyslipidemia 09/14/2019  . Nodule of flexor tendon sheath 04/13/2019  . Hemorrhoids 06/24/2018  . Low bone mass 01/19/2018  . Iliac aneurysm ([place]) 06/16/2017  . Hypertrophic cardiomyopathy ([place]) 06/16/2017  . Essential hypertension 12/16/2016  . Hyperlipidemia LDL goal <100 12/16/2016  . Asthma in adult, mild intermittent, uncomplicated 96/22/2979  . Collapsed vertebra, not elsewhere classified, cervical region, initial encounter for fracture ([place]) 10/08/2016  . Neuropathy 10/08/2016  . Contracture of ankle and foot joint 08/07/2014  . Dysphagia 08/07/2014  . Esophageal reflux 08/07/2014  . Erectile dysfunction 08/07/2014  . Obstructive sleep apnea 08/07/2014  . Osteoarthritis 08/07/2014  . Rheumatoid  arthritis ([place]) 08/07/2014  . Sensorineural hearing loss 08/07/2014  . Sleep disorder 08/07/2014  . Tinnitus 08/07/2014  . LVH (left ventricular hypertrophy) 12/25/2012  . Diverticulosis 09/01/2011  . White coat hypertension 07/07/2011  . Hypothyroidism 06/09/2011  . Postablative hypothyroidism 06/09/2011  . Psoriatic arthritis ([place]) 06/09/2011  . Benign prostatic hyperplasia 06/09/2011     Janene Harvey, PT, DPT 05/09/20 11:35 AM   [facility] [address]  [place], [place], [address] Phone: [phone]   Fax:  [phone]  Name: DEKLIN BIELER MRN: [id number] Date of Birth: 1946-04-03

## 2020-05-13 ENCOUNTER — Other Ambulatory Visit: Payer: Self-pay

## 2020-05-13 ENCOUNTER — Ambulatory Visit: Payer: Medicare Other

## 2020-05-13 VITALS — BP 138/70 | HR 73

## 2020-05-13 DIAGNOSIS — M546 Pain in thoracic spine: Secondary | ICD-10-CM

## 2020-05-13 DIAGNOSIS — R293 Abnormal posture: Secondary | ICD-10-CM

## 2020-05-13 DIAGNOSIS — M62830 Muscle spasm of back: Secondary | ICD-10-CM

## 2020-05-13 DIAGNOSIS — R29898 Other symptoms and signs involving the musculoskeletal system: Secondary | ICD-10-CM

## 2020-05-13 NOTE — Therapy (Signed)
[facility] [address]  [place], [place], [address] Phone: [phone]   Fax:  [phone]  Physical Therapy Treatment  Patient Details  Name: Martin Rubio MRN: [id number] Date of Birth: February 23, 1946 Referring Provider (PT): Clearance Coots, MD   Encounter Date: 05/13/2020  PT End of Session - 05/13/20 0947    Visit Number  2    Number of Visits  10    Date for PT Re-Evaluation  06/20/20    Authorization Type  Medicare & [employer]    PT Start Time  [id number]    PT Stop Time  1015    PT Time Calculation (min)  41 min    Activity Tolerance  Patient tolerated treatment well;Patient limited by pain    Behavior During Therapy  [facility] for tasks assessed/performed       Past Medical History:  Diagnosis Date  . Bronchitis   . Diverticulosis   . FH: BPH (benign prostatic hypertrophy)   . History of Graves' disease 01/19/2018   S/p ablation  . HTN (hypertension)   . Hyperlipidemia   . Obesity   . Psoriatic arthritis ([place])   . Rheumatoid arthritis ([place]) 08/07/2014  . Sleep apnea    CPAP  . Thyroid disease     Past Surgical History:  Procedure Laterality Date  . INGUINAL HERNIA REPAIR     bilateral  . KNEE SURGERY     bilateral arthroscopic  . PACEMAKER IMPLANT N/A 12/13/2019   Procedure: PACEMAKER IMPLANT;  Surgeon: Evans Lance, MD;  Location: [facility];  Service: Cardiovascular;  Laterality: N/A;  . TRANSURETHRAL RESECTION OF PROSTATE    . UMBILICAL HERNIA REPAIR      Vitals:   05/13/20 0944  BP: 138/70  Pulse: 73  SpO2: 97%    Subjective Assessment - 05/13/20 0944    Subjective  Had some pain for a few days after performing MD HEP and PT HEP - not sure which exercise bothered him.    Pertinent History  pacemaker, grave's disease, RA, psoriatic arthritis, HLD, HTN, B arthroscopic knee surgery    Diagnostic tests  04/25/20 lumbar xray: Mild degenerative disc disease is noted at L3-4. No  acuteabnormality seen in the lumbar spine.; 04/25/20 thoracic xray: No acute abnormality seen in the thoracic spine. Multilevel degenerative disc disease is noted.    Patient Stated Goals  get rid of pain    Currently in Pain?  No/denies    Pain Score  0-No pain   up to 9/10 at worst   Pain Location  Back    Pain Orientation  Right    Pain Descriptors / Indicators  Sharp    Pain Type  Acute pain    Multiple Pain Sites  No         [facility] PT Assessment - 05/13/20 0001      Observation/Other Assessments   Focus on Therapeutic Outcomes (FOTO)   thoracic FOTO: 43% (57% limitation)                    [facility] Adult PT Treatment/Exercise - 05/13/20 0001      Self-Care   Self-Care  Other Self-Care Comments    Other Self-Care Comments   Discussed proper sitting posture at desk top computer as pt. spends significant time using computer at home; pt. with good overall understanding of proper desk sitting positioning however cued him to change positions frequently  Lumbar Exercises: Aerobic   Nustep  Lvl 3, 6 min (UE/LE)      Lumbar Exercises: Standing   Row  Both;10 reps;Strengthening;Theraband    Theraband Level (Row)  Level 2 (Red)    Row Limitations  cues for proper scapular retraction/depression       Lumbar Exercises: Seated   Other Seated Lumbar Exercises  thoracic extension in chair 3" x 10     Other Seated Lumbar Exercises  seated scapular retraction 5" x 10      Neck Exercises: Stretches   Corner Stretch  30 seconds;2 reps   low and mid - pt. prefers mid version    Warehouse manager Limitations  in doorway - pt. does not have a clear corner in his home               PT Short Term Goals - 05/13/20 0947      PT SHORT TERM GOAL #1   Title  Pt will be independent with initial HEP.    Time  3    Period  Weeks    Status  On-going    Target Date  05/30/20        PT Long Term Goals - 05/13/20 0947      PT LONG TERM GOAL #1   Title  Pt will be  independent with advanced HEP.    Time  6    Period  Weeks    Status  On-going      PT LONG TERM GOAL #2   Title  Pt will demonstrate proper body mechanics to lift 20lb box to avoid further injury.    Time  6    Period  Weeks    Status  On-going      PT LONG TERM GOAL #3   Title  Patient to demonstrate thoracic AROM WFL and without pain limiting.    Time  6    Period  Weeks    Status  On-going      PT LONG TERM GOAL #4   Title  Patient to recall/demonstrate postural correction at rest and with activity to improve postural awareness.    Time  6    Period  Weeks    Status  On-going      PT LONG TERM GOAL #5   Title  Patient to report 75% improvement in pain.    Time  6    Period  Weeks    Status  On-going            Plan - 05/13/20 0947    Clinical Impression Statement  Pt. doing well today however did note some increased mid/upper back pain after performing combination of MD and PT HEP on Friday with a few days of soreness following up to 9/10 pain.  Session focused on review of PT HEP to check for tolerance with pt. requiring mod cueing required for proper scapular retraction/depression and doorway chest stretch positioning.  Instruction required multiple times today to avoid excessive overpressure into stretches and back ROM activities to avoid pain.  Pt. verbalized understanding after instruction for appropriate pressure with HEP activities.  Able to progress throughout session without significant LBP.  Did have complaint of R 5th digit pain which was relieved with movement.  Will plan to review MD HEP to check for proper ROM and technique with bridge and other MD HEP activities in coming session.  Will also initiated MT to back musculature in coming sessions.    Comorbidities  pacemaker, grave's disease, RA, psoriatic arthritis, HLD, HTN, B arthroscopic knee surgery    PT Treatment/Interventions  ADLs/Self Care Home Management;Cryotherapy;Moist Heat;Balance  training;Therapeutic exercise;Therapeutic activities;Functional mobility training;Stair training;Gait training;Neuromuscular re-education;Patient/family education;Manual techniques;Taping;Energy conservation;Dry needling;Passive range of motion    PT Next Visit Plan  address technique with MD HEP; central spinal PAs and STM to R lats, postural correction ther-ex    Consulted and Agree with Plan of Care  Patient       Patient will benefit from skilled therapeutic intervention in order to improve the following deficits and impairments:  Hypomobility, Decreased activity tolerance, Decreased strength, Increased fascial restricitons, Impaired UE functional use, Pain, Difficulty walking, Increased muscle spasms, Cardiopulmonary status limiting activity, Improper body mechanics, Decreased range of motion, Impaired flexibility, Postural dysfunction  Visit Diagnosis: Pain in thoracic spine  Muscle spasm of back  Abnormal posture  Other symptoms and signs involving the musculoskeletal system     Problem List Patient Active Problem List   Diagnosis Date Noted  . Acute right-sided low back pain without sciatica 04/25/2020  . Secondary hypercoagulable state ([place]) 03/05/2020  . Educated about COVID-19 virus infection 01/27/2020  . PCP notes >>>>>>>>>>>>>>> 01/10/2020  . Age related osteoporosis 01/10/2020  . CKD (chronic kidney disease), stage IV ([place]) 12/16/2019  . Pacemaker 12/16/2019  . Sick sinus syndrome ([place]) 12/16/2019  . Chest pain 12/15/2019  . Paroxysmal atrial fibrillation ([place]) 10/26/2019  . Dyslipidemia 09/14/2019  . Nodule of flexor tendon sheath 04/13/2019  . Hemorrhoids 06/24/2018  . Low bone mass 01/19/2018  . Iliac aneurysm ([place]) 06/16/2017  . Hypertrophic cardiomyopathy ([place]) 06/16/2017  . Essential hypertension 12/16/2016  . Hyperlipidemia LDL goal <100 12/16/2016  . Asthma in adult, mild intermittent, uncomplicated 21/22/4825  . Collapsed vertebra, not elsewhere  classified, cervical region, initial encounter for fracture ([place]) 10/08/2016  . Neuropathy 10/08/2016  . Contracture of ankle and foot joint 08/07/2014  . Dysphagia 08/07/2014  . Esophageal reflux 08/07/2014  . Erectile dysfunction 08/07/2014  . Obstructive sleep apnea 08/07/2014  . Osteoarthritis 08/07/2014  . Rheumatoid arthritis ([place]) 08/07/2014  . Sensorineural hearing loss 08/07/2014  . Sleep disorder 08/07/2014  . Tinnitus 08/07/2014  . LVH (left ventricular hypertrophy) 12/25/2012  . Diverticulosis 09/01/2011  . White coat hypertension 07/07/2011  . Hypothyroidism 06/09/2011  . Postablative hypothyroidism 06/09/2011  . Psoriatic arthritis ([place]) 06/09/2011  . Benign prostatic hyperplasia 06/09/2011    Bess Harvest, PTA 05/13/20 10:56 AM   [facility] [address]  [place], [place], [address] Phone: [phone]   Fax:  [phone]  Name: Martin Rubio MRN: [id number] Date of Birth: 25-Feb-1946

## 2020-05-15 ENCOUNTER — Other Ambulatory Visit: Payer: Self-pay

## 2020-05-15 ENCOUNTER — Ambulatory Visit: Payer: Medicare Other

## 2020-05-15 DIAGNOSIS — M546 Pain in thoracic spine: Secondary | ICD-10-CM

## 2020-05-15 DIAGNOSIS — R29898 Other symptoms and signs involving the musculoskeletal system: Secondary | ICD-10-CM

## 2020-05-15 DIAGNOSIS — R293 Abnormal posture: Secondary | ICD-10-CM

## 2020-05-15 DIAGNOSIS — M62830 Muscle spasm of back: Secondary | ICD-10-CM

## 2020-05-15 NOTE — Therapy (Signed)
[facility] [address]  [place], [place], [address] Phone: [phone]   Fax:  [phone]  Physical Therapy Treatment  Patient Details  Name: Martin Rubio MRN: [id number] Date of Birth: [birth year] Referring Provider (PT): Clearance Coots, MD   Encounter Date: 05/15/2020  PT End of Session - 05/15/20 1322    Visit Number  3    Number of Visits  10    Date for PT Re-Evaluation  06/20/20    Authorization Type  Medicare & [employer]    PT Start Time  1316    PT Stop Time  1410   ended visit with 10 min moist heat   PT Time Calculation (min)  54 min    Activity Tolerance  Patient tolerated treatment well    Behavior During Therapy  [facility] for tasks assessed/performed       Past Medical History:  Diagnosis Date  . Bronchitis   . Diverticulosis   . FH: BPH (benign prostatic hypertrophy)   . History of Graves' disease 01/19/2018   S/p ablation  . HTN (hypertension)   . Hyperlipidemia   . Obesity   . Psoriatic arthritis ([place])   . Rheumatoid arthritis ([place]) 08/07/2014  . Sleep apnea    CPAP  . Thyroid disease     Past Surgical History:  Procedure Laterality Date  . INGUINAL HERNIA REPAIR     bilateral  . KNEE SURGERY     bilateral arthroscopic  . PACEMAKER IMPLANT N/A 12/13/2019   Procedure: PACEMAKER IMPLANT;  Surgeon: Evans Lance, MD;  Location: [facility];  Service: Cardiovascular;  Laterality: N/A;  . TRANSURETHRAL RESECTION OF PROSTATE    . UMBILICAL HERNIA REPAIR      There were no vitals filed for this visit.  Subjective Assessment - 05/15/20 1321    Subjective  Pt. doing ok.  " I hurt myself yesterday doing the exercises, but I think it was the doctors exercises".    Pertinent History  pacemaker, grave's disease, RA, psoriatic arthritis, HLD, HTN, B arthroscopic knee surgery    Diagnostic tests  04/25/20 lumbar xray: Mild degenerative disc disease is noted at L3-4. No acuteabnormality  seen in the lumbar spine.; 04/25/20 thoracic xray: No acute abnormality seen in the thoracic spine. Multilevel degenerative disc disease is noted.    Patient Stated Goals  get rid of pain    Currently in Pain?  Yes    Pain Score  3     Pain Location  Back    Pain Orientation  Right    Pain Type  Acute pain    Multiple Pain Sites  No                        [facility] Adult PT Treatment/Exercise - 05/15/20 0001      Lumbar Exercises: Stretches   Single Knee to Chest Stretch  Right;Left;1 rep;30 seconds    Single Knee to Chest Stretch Limitations  B    Lower Trunk Rotation Limitations  5" x 10 reps     Piriformis Stretch  Right;Left;30 seconds;3 reps   towel    Piriformis Stretch Limitations  KTOS - with towel       Lumbar Exercises: Aerobic   Nustep  Lvl 3, 6 min (UE/LE)      Lumbar Exercises: Seated   Other Seated Lumbar Exercises  thoracic extension in chair 3" x 10  Other Seated Lumbar Exercises  seated scapular retraction 5" x 10      Moist Heat Therapy   Number Minutes Moist Heat  10 Minutes    Moist Heat Location  Lumbar Spine   lumbar thoracic cervical spine               PT Short Term Goals - 05/13/20 0947      PT SHORT TERM GOAL #1   Title  Pt will be independent with initial HEP.    Time  3    Period  Weeks    Status  On-going    Target Date  05/30/20        PT Long Term Goals - 05/13/20 0947      PT LONG TERM GOAL #1   Title  Pt will be independent with advanced HEP.    Time  6    Period  Weeks    Status  On-going      PT LONG TERM GOAL #2   Title  Pt will demonstrate proper body mechanics to lift 20lb box to avoid further injury.    Time  6    Period  Weeks    Status  On-going      PT LONG TERM GOAL #3   Title  Patient to demonstrate thoracic AROM WFL and without pain limiting.    Time  6    Period  Weeks    Status  On-going      PT LONG TERM GOAL #4   Title  Patient to recall/demonstrate postural correction at rest  and with activity to improve postural awareness.    Time  6    Period  Weeks    Status  On-going      PT LONG TERM GOAL #5   Title  Patient to report 75% improvement in pain.    Time  6    Period  Weeks    Status  On-going            Plan - 05/15/20 1817    Clinical Impression Statement  [name] doing ok.  Feels that he may have irritated his lower back performing MD HEP bridge activity.  Pt. did have pain with x repetition of hooklying bridge thus instructed pt. to defer this activity until later date.  Session focused on LE stretching and lumbopelvic strengthening to tolerance.  Pt. did require cueing for proper stretching technique to relax LE musculature and for proper hand positioning to ensure proper stretch.  Pt. verbalized poor understanding of proper technique with LE stretching and may require further instruction in upcoming visits.  Ended visit with trial of moist heat to lumbar/thoracic/cervical spine for relaxation of musculature.  Pt. leaving session noting good resolution of LBP and instructed to return to daily adherence of PT HEP and bring handout for MD HEP with him at upcoming visit for further review of tolerance and proper technique.     Comorbidities  pacemaker, grave's disease, RA, psoriatic arthritis, HLD, HTN, B arthroscopic knee surgery    Rehab Potential  Good    PT Treatment/Interventions  ADLs/Self Care Home Management;Cryotherapy;Moist Heat;Balance training;Therapeutic exercise;Therapeutic activities;Functional mobility training;Stair training;Gait training;Neuromuscular re-education;Patient/family education;Manual techniques;Taping;Energy conservation;Dry needling;Passive range of motion    PT Next Visit Plan  address technique with MD HEP (if pt. brings to session); central spinal PAs and STM to R lats, postural correction ther-ex    Consulted and Agree with Plan of Care  Patient  Patient will benefit from skilled therapeutic intervention in order to  improve the following deficits and impairments:  Hypomobility, Decreased activity tolerance, Decreased strength, Increased fascial restricitons, Impaired UE functional use, Pain, Difficulty walking, Increased muscle spasms, Cardiopulmonary status limiting activity, Improper body mechanics, Decreased range of motion, Impaired flexibility, Postural dysfunction  Visit Diagnosis: Pain in thoracic spine  Muscle spasm of back  Abnormal posture  Other symptoms and signs involving the musculoskeletal system     Problem List Patient Active Problem List   Diagnosis Date Noted  . Acute right-sided low back pain without sciatica 04/25/2020  . Secondary hypercoagulable state ([place]) 03/05/2020  . Educated about COVID-19 virus infection 01/27/2020  . PCP notes >>>>>>>>>>>>>>> 01/10/2020  . Age related osteoporosis 01/10/2020  . CKD (chronic kidney disease), stage IV ([place]) 12/16/2019  . Pacemaker 12/16/2019  . Sick sinus syndrome ([place]) 12/16/2019  . Chest pain 12/15/2019  . Paroxysmal atrial fibrillation ([place]) 10/26/2019  . Dyslipidemia 09/14/2019  . Nodule of flexor tendon sheath 04/13/2019  . Hemorrhoids 06/24/2018  . Low bone mass 01/19/2018  . Iliac aneurysm ([place]) 06/16/2017  . Hypertrophic cardiomyopathy ([place]) 06/16/2017  . Essential hypertension 12/16/2016  . Hyperlipidemia LDL goal <100 12/16/2016  . Asthma in adult, mild intermittent, uncomplicated 01/29/3611  . Collapsed vertebra, not elsewhere classified, cervical region, initial encounter for fracture ([place]) 10/08/2016  . Neuropathy 10/08/2016  . Contracture of ankle and foot joint 08/07/2014  . Dysphagia 08/07/2014  . Esophageal reflux 08/07/2014  . Erectile dysfunction 08/07/2014  . Obstructive sleep apnea 08/07/2014  . Osteoarthritis 08/07/2014  . Rheumatoid arthritis ([place]) 08/07/2014  . Sensorineural hearing loss 08/07/2014  . Sleep disorder 08/07/2014  . Tinnitus 08/07/2014  . LVH (left ventricular hypertrophy)  12/25/2012  . Diverticulosis 09/01/2011  . White coat hypertension 07/07/2011  . Hypothyroidism 06/09/2011  . Postablative hypothyroidism 06/09/2011  . Psoriatic arthritis ([place]) 06/09/2011  . Benign prostatic hyperplasia 06/09/2011    Bess Harvest, PTA 05/15/20 6:18 PM   [facility] [address]  [place], [place], [address] Phone: [phone]   Fax:  [phone]  Name: JOHNWESLEY LEDERMAN MRN: [id number] Date of Birth: 02-Dec-1946

## 2020-05-19 ENCOUNTER — Other Ambulatory Visit: Payer: Self-pay | Admitting: Family Medicine

## 2020-05-20 ENCOUNTER — Ambulatory Visit: Payer: Medicare Other

## 2020-05-20 ENCOUNTER — Other Ambulatory Visit: Payer: Self-pay

## 2020-05-20 DIAGNOSIS — R29898 Other symptoms and signs involving the musculoskeletal system: Secondary | ICD-10-CM

## 2020-05-20 DIAGNOSIS — M546 Pain in thoracic spine: Secondary | ICD-10-CM

## 2020-05-20 DIAGNOSIS — R293 Abnormal posture: Secondary | ICD-10-CM

## 2020-05-20 DIAGNOSIS — M62830 Muscle spasm of back: Secondary | ICD-10-CM

## 2020-05-20 NOTE — Therapy (Signed)
[facility] [address]  [place], [place], [address] Phone: [phone]   Fax:  [phone]  Physical Therapy Treatment  Patient Details  Name: Martin Rubio MRN: [id number] Date of Birth: [birth year] Referring Provider (PT): Clearance Coots, MD   Encounter Date: 05/20/2020   PT End of Session - 05/20/20 0938    Visit Number 4    Number of Visits 10    Date for PT Re-Evaluation 06/20/20    Authorization Type Medicare & [employer]    PT Start Time [phone]    PT Stop Time 1025   ended visit with 10 min moist heat   PT Time Calculation (min) 52 min    Activity Tolerance Patient tolerated treatment well    Behavior During Therapy [facility] for tasks assessed/performed           Past Medical History:  Diagnosis Date  . Bronchitis   . Diverticulosis   . FH: BPH (benign prostatic hypertrophy)   . History of Graves' disease 01/19/2018   S/p ablation  . HTN (hypertension)   . Hyperlipidemia   . Obesity   . Psoriatic arthritis ([place])   . Rheumatoid arthritis ([place]) 08/07/2014  . Sleep apnea    CPAP  . Thyroid disease     Past Surgical History:  Procedure Laterality Date  . INGUINAL HERNIA REPAIR     bilateral  . KNEE SURGERY     bilateral arthroscopic  . PACEMAKER IMPLANT N/A 12/13/2019   Procedure: PACEMAKER IMPLANT;  Surgeon: Evans Lance, MD;  Location: [facility];  Service: Cardiovascular;  Laterality: N/A;  . TRANSURETHRAL RESECTION OF PROSTATE    . UMBILICAL HERNIA REPAIR      There were no vitals filed for this visit.   Subjective Assessment - 05/20/20 0936    Subjective Pt. reporting some improvement in mid back pain.  Primary complaint is R-sided LBP.  Used vaccum cleaner and moving things out of the way bothered his LBP this morning.    Pertinent History pacemaker, grave's disease, RA, psoriatic arthritis, HLD, HTN, B arthroscopic knee surgery    Diagnostic tests 04/25/20 lumbar xray: Mild  degenerative disc disease is noted at L3-4. No acuteabnormality seen in the lumbar spine.; 04/25/20 thoracic xray: No acute abnormality seen in the thoracic spine. Multilevel degenerative disc disease is noted.    Patient Stated Goals get rid of pain    Currently in Pain? Yes    Pain Score 1    up to 8/10 this morning   Pain Location Back    Pain Orientation Right    Pain Descriptors / Indicators Sharp    Pain Type Acute pain    Aggravating Factors  moving things out of the way to vacuum    Multiple Pain Sites No                             [facility] Adult PT Treatment/Exercise - 05/20/20 0001      Lumbar Exercises: Stretches   Passive Hamstring Stretch Right;Left;1 rep;20 seconds    Passive Hamstring Stretch Limitations difficulty in standing MD HEP   told patient to hold off on standing version for supine    Single Knee to Chest Stretch Right;Left;1 rep;30 seconds    Single Knee to Chest Stretch Limitations B    Piriformis Stretch Right;Left;30 seconds;1 rep   pt. preferring without towel assistance  Piriformis Stretch Limitations KTOS - with towel       Lumbar Exercises: Aerobic   Nustep Lvl 3, 6 min (UE/LE)      Lumbar Exercises: Supine   Pelvic Tilt 10 reps;5 seconds    Pelvic Tilt Limitations good performance     Other Supine Lumbar Exercises MD HEP: supine partial crunch 3" x 10    deferred this activity from MD HEP due to pain      Lumbar Exercises: Prone   Straight Leg Raise 10 reps;3 seconds   well tolerated from MD handout with pillow    Straight Leg Raises Limitations pillow under hips for comfort       Lumbar Exercises: Quadruped   [place]cat/Old Horse 10 reps    Madcat/Old Horse Limitations Cat/camal - min cueing for neutral shoulder/elbows    did have to cue pt. to avoid over aggressive ROM for pain      Moist Heat Therapy   Number Minutes Moist Heat 10 Minutes    Moist Heat Location Lumbar Spine   and thoracic spine                     PT Short Term Goals - 05/13/20 0947      PT SHORT TERM GOAL #1   Title Pt will be independent with initial HEP.    Time 3    Period Weeks    Status On-going    Target Date 05/30/20             PT Long Term Goals - 05/13/20 0947      PT LONG TERM GOAL #1   Title Pt will be independent with advanced HEP.    Time 6    Period Weeks    Status On-going      PT LONG TERM GOAL #2   Title Pt will demonstrate proper body mechanics to lift 20lb box to avoid further injury.    Time 6    Period Weeks    Status On-going      PT LONG TERM GOAL #3   Title Patient to demonstrate thoracic AROM WFL and without pain limiting.    Time 6    Period Weeks    Status On-going      PT LONG TERM GOAL #4   Title Patient to recall/demonstrate postural correction at rest and with activity to improve postural awareness.    Time 6    Period Weeks    Status On-going      PT LONG TERM GOAL #5   Title Patient to report 75% improvement in pain.    Time 6    Period Weeks    Status On-going                 Plan - 05/20/20 0947    Clinical Impression Statement Session focused on review and check for proper technique with MD HEP issued to pt.  Pt. requiring clarification with piriformis stretch and pelvic tilt exercises along with cuing for positioning with quadruped cat/camel lumbar ROM activity.  Pt. noting LBP following bridge activity in earlier sessions as he notes this was one of his MD HEP activities however MD HEP handout did not show this activity listed.  Pt. instructed to hold off on performing partial crunch, and standing HS stretch due to poor tolerance from MD HEP handout.  Ended session with moist heat to lumbar/thoracic spine for relaxation of musculature with good response.  Pt. noting improvement in  comfort with therapy and seemed to verbalize/demo improved understanding of HEP (PT and MD HEP) to end session.    Comorbidities pacemaker, grave's disease, RA, psoriatic arthritis, HLD,  HTN, B arthroscopic knee surgery    PT Next Visit Plan Central spinal PAs and STM to R lats, postural correction ther-ex; further MD/PT HEP review prn    Consulted and Agree with Plan of Care Patient           Patient will benefit from skilled therapeutic intervention in order to improve the following deficits and impairments:  Hypomobility, Decreased activity tolerance, Decreased strength, Increased fascial restricitons, Impaired UE functional use, Pain, Difficulty walking, Increased muscle spasms, Cardiopulmonary status limiting activity, Improper body mechanics, Decreased range of motion, Impaired flexibility, Postural dysfunction  Visit Diagnosis: Pain in thoracic spine  Muscle spasm of back  Abnormal posture  Other symptoms and signs involving the musculoskeletal system     Problem List Patient Active Problem List   Diagnosis Date Noted  . Acute right-sided low back pain without sciatica 04/25/2020  . Secondary hypercoagulable state ([place]) 03/05/2020  . Educated about COVID-19 virus infection 01/27/2020  . PCP notes >>>>>>>>>>>>>>> 01/10/2020  . Age related osteoporosis 01/10/2020  . CKD (chronic kidney disease), stage IV ([place]) 12/16/2019  . Pacemaker 12/16/2019  . Sick sinus syndrome ([place]) 12/16/2019  . Chest pain 12/15/2019  . Paroxysmal atrial fibrillation ([place]) 10/26/2019  . Dyslipidemia 09/14/2019  . Nodule of flexor tendon sheath 04/13/2019  . Hemorrhoids 06/24/2018  . Low bone mass 01/19/2018  . Iliac aneurysm ([place]) 06/16/2017  . Hypertrophic cardiomyopathy ([place]) 06/16/2017  . Essential hypertension 12/16/2016  . Hyperlipidemia LDL goal <100 12/16/2016  . Asthma in adult, mild intermittent, uncomplicated 47/42/5956  . Collapsed vertebra, not elsewhere classified, cervical region, initial encounter for fracture ([place]) 10/08/2016  . Neuropathy 10/08/2016  . Contracture of ankle and foot joint 08/07/2014  . Dysphagia 08/07/2014  . Esophageal reflux 08/07/2014    . Erectile dysfunction 08/07/2014  . Obstructive sleep apnea 08/07/2014  . Osteoarthritis 08/07/2014  . Rheumatoid arthritis ([place]) 08/07/2014  . Sensorineural hearing loss 08/07/2014  . Sleep disorder 08/07/2014  . Tinnitus 08/07/2014  . LVH (left ventricular hypertrophy) 12/25/2012  . Diverticulosis 09/01/2011  . White coat hypertension 07/07/2011  . Hypothyroidism 06/09/2011  . Postablative hypothyroidism 06/09/2011  . Psoriatic arthritis ([place]) 06/09/2011  . Benign prostatic hyperplasia 06/09/2011    Bess Harvest, PTA 05/20/20 11:46 AM   [facility] [address]  [place], [place], [address] Phone: [phone]   Fax:  [phone]  Name: JARAD BARTH MRN: [id number] Date of Birth: 30-Aug-1946

## 2020-05-22 ENCOUNTER — Ambulatory Visit: Payer: Medicare Other | Admitting: Physical Therapy

## 2020-05-22 ENCOUNTER — Encounter: Payer: Self-pay | Admitting: Physical Therapy

## 2020-05-22 ENCOUNTER — Other Ambulatory Visit: Payer: Self-pay

## 2020-05-22 DIAGNOSIS — R29898 Other symptoms and signs involving the musculoskeletal system: Secondary | ICD-10-CM

## 2020-05-22 DIAGNOSIS — M79671 Pain in right foot: Secondary | ICD-10-CM | POA: Diagnosis not present

## 2020-05-22 DIAGNOSIS — L4050 Arthropathic psoriasis, unspecified: Secondary | ICD-10-CM | POA: Diagnosis not present

## 2020-05-22 DIAGNOSIS — M5136 Other intervertebral disc degeneration, lumbar region: Secondary | ICD-10-CM | POA: Diagnosis not present

## 2020-05-22 DIAGNOSIS — M62830 Muscle spasm of back: Secondary | ICD-10-CM

## 2020-05-22 DIAGNOSIS — Z6830 Body mass index (BMI) 30.0-30.9, adult: Secondary | ICD-10-CM | POA: Diagnosis not present

## 2020-05-22 DIAGNOSIS — M150 Primary generalized (osteo)arthritis: Secondary | ICD-10-CM | POA: Diagnosis not present

## 2020-05-22 DIAGNOSIS — M546 Pain in thoracic spine: Secondary | ICD-10-CM

## 2020-05-22 DIAGNOSIS — E669 Obesity, unspecified: Secondary | ICD-10-CM | POA: Diagnosis not present

## 2020-05-22 DIAGNOSIS — M545 Low back pain: Secondary | ICD-10-CM | POA: Diagnosis not present

## 2020-05-22 DIAGNOSIS — R293 Abnormal posture: Secondary | ICD-10-CM

## 2020-05-22 DIAGNOSIS — L409 Psoriasis, unspecified: Secondary | ICD-10-CM | POA: Diagnosis not present

## 2020-05-22 NOTE — Therapy (Signed)
[facility] [address]  [place], [place], [address] Phone: [phone]   Fax:  [phone]  Physical Therapy Treatment  Patient Details  Name: Martin Rubio MRN: [id number] Date of Birth: 30-Oct-1946 Referring Provider (PT): Clearance Coots, MD   Encounter Date: 05/22/2020   PT End of Session - 05/22/20 1207    Visit Number 5    Number of Visits 10    Date for PT Re-Evaluation 06/20/20    Authorization Type Medicare & [employer] Life    PT Start Time [phone]    PT Stop Time 1014    PT Time Calculation (min) 40 min    Activity Tolerance Patient tolerated treatment well;Patient limited by pain    Behavior During Therapy [facility] for tasks assessed/performed           Past Medical History:  Diagnosis Date  . Bronchitis   . Diverticulosis   . FH: BPH (benign prostatic hypertrophy)   . History of Graves' disease 01/19/2018   S/p ablation  . HTN (hypertension)   . Hyperlipidemia   . Obesity   . Psoriatic arthritis ([place])   . Rheumatoid arthritis ([place]) 08/07/2014  . Sleep apnea    CPAP  . Thyroid disease     Past Surgical History:  Procedure Laterality Date  . INGUINAL HERNIA REPAIR     bilateral  . KNEE SURGERY     bilateral arthroscopic  . PACEMAKER IMPLANT N/A 12/13/2019   Procedure: PACEMAKER IMPLANT;  Surgeon: Evans Lance, MD;  Location: [facility];  Service: Cardiovascular;  Laterality: N/A;  . TRANSURETHRAL RESECTION OF PROSTATE    . UMBILICAL HERNIA REPAIR      There were no vitals filed for this visit.   Subjective Assessment - 05/22/20 0935    Subjective Having some more low back pain today- not sure if he slept funny. Midback pain flucuates. Notes compliance with HEP 1x daily.    Pertinent History pacemaker, grave's disease, RA, psoriatic arthritis, HLD, HTN, B arthroscopic knee surgery    Diagnostic tests 04/25/20 lumbar xray: Mild degenerative disc disease is noted at L3-4. No  acuteabnormality seen in the lumbar spine.; 04/25/20 thoracic xray: No acute abnormality seen in the thoracic spine. Multilevel degenerative disc disease is noted.    Patient Stated Goals get rid of pain    Currently in Pain? Yes    Pain Score 2     Pain Location Back    Pain Orientation Right;Left;Lower    Pain Descriptors / Indicators Dull    Pain Type Acute pain              [facility] PT Assessment - 05/22/20 0001      AROM   Thoracic Flexion WNL    Thoracic Extension moderately limited    Thoracic - Right Side Bend WFL   "stretch" in QL   Thoracic - Left Side Bend WFL   "stretch" in QL   Thoracic - Right Rotation moderately limited   mild pain in lats   Thoracic - Left Rotation moderately limited   mild pain in lats                        [facility] Adult PT Treatment/Exercise - 05/22/20 0001      Therapeutic Activites    Therapeutic Activities ADL's    ADL's practice and demonstration lifting 10# box from floor 3x   tendency to round through  upper back with effort to correct     Lumbar Exercises: Stretches   Other Lumbar Stretch Exercise R/L open book stretch at wall to tolerance x5   more limitation to L     Lumbar Exercises: Aerobic   UBE (Upper Arm Bike) L1.5x 30mn forward/349m back       Lumbar Exercises: Standing   Row Both;10 reps;Strengthening;Theraband    Theraband Level (Row) Level 2 (Red);Level 3 (Green)    Row Limitations 2nd set green TB; cues to avoid shoulder hyperextension    Other Standing Lumbar Exercises B shoulder ER with yellow x10   discontinued d/t pain in L shoulder   Other Standing Lumbar Exercises B horizontal abduction with yellow TB standing at doorframe x5, in supine x5, x10   c/o R shoulder pain in standing, better in supine                 PT Education - 05/22/20 1206    Education Details admistered green TB for rows; discussion on objective progress and remaining impairements    Person(s) Educated Patient    Methods  Explanation;Demonstration    Comprehension Verbalized understanding;Returned demonstration            PT Short Term Goals - 05/22/20 0941      PT SHORT TERM GOAL #1   Title Pt will be independent with initial HEP.    Time 3    Period Weeks    Status Achieved    Target Date 05/30/20             PT Long Term Goals - 05/22/20 0942      PT LONG TERM GOAL #1   Title Pt will be independent with advanced HEP.    Time 6    Period Weeks    Status Partially Met   met for current     PT LONG TERM GOAL #2   Title Pt will demonstrate proper body mechanics to lift 20lb box to avoid further injury.    Time 6    Period Weeks    Status Partially Met   able to lift 10# with mild LBP     PT LONG TERM GOAL #3   Title Patient to demonstrate thoracic AROM WFL and without pain limiting.    Time 6    Period Weeks    Status On-going   proved in flexion, extension, SB     PT LONG TERM GOAL #4   Title Patient to recall/demonstrate postural correction at rest and with activity to improve postural awareness.    Time 6    Period Weeks    Status On-going   noting improved awareness of posture and able to correct himseld throughout the day     PT LONG TERM GOAL #5   Title Patient to report 75% improvement in pain.    Time 6    Period Weeks    Status On-going   reports 70% improvement in pain levels                Plan - 05/22/20 1207    Clinical Impression Statement Patient noting fluctuating midback pain and increased LBP pain this AM. Notes 70% improvement in pain levels since initial eval. Patient also notes improved awareness of posture and notes ability to correct himself throughout the day. Thoracic kyphosis still quite evident at rest. Thoracic AROM has improved in flexion, extension, and sidebending, with most remaining limitation in rotation. Patient was able to lift 10lb  box with ability to maintain neutral spine after cueing. Initiated progressive periscapular strengthening  with light banded resistance today. Modifications made intermittently d/t patient's c/o B shoulder pain. Administered green TB for scapular rows as this was well-tolerated today. Patient noting some soreness in shoulders at end of session but declined modalities. Patient progressing well towards goals. Would benefit from continued skilled PT services to address remaining goals.    Comorbidities pacemaker, grave's disease, RA, psoriatic arthritis, HLD, HTN, B arthroscopic knee surgery    PT Treatment/Interventions ADLs/Self Care Home Management;Cryotherapy;Moist Heat;Balance training;Therapeutic exercise;Therapeutic activities;Functional mobility training;Stair training;Gait training;Neuromuscular re-education;Patient/family education;Manual techniques;Taping;Energy conservation;Dry needling;Passive range of motion    PT Next Visit Plan Central spinal PAs and STM to R lats, postural correction ther-ex; further MD/PT HEP review prn    Consulted and Agree with Plan of Care Patient           Patient will benefit from skilled therapeutic intervention in order to improve the following deficits and impairments:  Hypomobility, Decreased activity tolerance, Decreased strength, Increased fascial restricitons, Impaired UE functional use, Pain, Difficulty walking, Increased muscle spasms, Cardiopulmonary status limiting activity, Improper body mechanics, Decreased range of motion, Impaired flexibility, Postural dysfunction  Visit Diagnosis: Pain in thoracic spine  Muscle spasm of back  Abnormal posture  Other symptoms and signs involving the musculoskeletal system     Problem List Patient Active Problem List   Diagnosis Date Noted  . Acute right-sided low back pain without sciatica 04/25/2020  . Secondary hypercoagulable state ([place]) 03/05/2020  . Educated about COVID-19 virus infection 01/27/2020  . PCP notes >>>>>>>>>>>>>>> 01/10/2020  . Age related osteoporosis 01/10/2020  . CKD (chronic kidney  disease), stage IV ([place]) 12/16/2019  . Pacemaker 12/16/2019  . Sick sinus syndrome ([place]) 12/16/2019  . Chest pain 12/15/2019  . Paroxysmal atrial fibrillation ([place]) 10/26/2019  . Dyslipidemia 09/14/2019  . Nodule of flexor tendon sheath 04/13/2019  . Hemorrhoids 06/24/2018  . Low bone mass 01/19/2018  . Iliac aneurysm ([place]) 06/16/2017  . Hypertrophic cardiomyopathy ([place]) 06/16/2017  . Essential hypertension 12/16/2016  . Hyperlipidemia LDL goal <100 12/16/2016  . Asthma in adult, mild intermittent, uncomplicated 47/06/6150  . Collapsed vertebra, not elsewhere classified, cervical region, initial encounter for fracture ([place]) 10/08/2016  . Neuropathy 10/08/2016  . Contracture of ankle and foot joint 08/07/2014  . Dysphagia 08/07/2014  . Esophageal reflux 08/07/2014  . Erectile dysfunction 08/07/2014  . Obstructive sleep apnea 08/07/2014  . Osteoarthritis 08/07/2014  . Rheumatoid arthritis ([place]) 08/07/2014  . Sensorineural hearing loss 08/07/2014  . Sleep disorder 08/07/2014  . Tinnitus 08/07/2014  . LVH (left ventricular hypertrophy) 12/25/2012  . Diverticulosis 09/01/2011  . White coat hypertension 07/07/2011  . Hypothyroidism 06/09/2011  . Postablative hypothyroidism 06/09/2011  . Psoriatic arthritis ([place]) 06/09/2011  . Benign prostatic hyperplasia 06/09/2011     Janene Harvey, PT, DPT 05/22/20 12:12 PM   [facility] [address]  [place], [place], [address] Phone: [phone]   Fax:  [phone]  Name: Martin Rubio MRN: [id number] Date of Birth: 01/27/1946

## 2020-05-23 DIAGNOSIS — L4050 Arthropathic psoriasis, unspecified: Secondary | ICD-10-CM | POA: Diagnosis not present

## 2020-05-24 ENCOUNTER — Ambulatory Visit: Payer: Medicare Other | Admitting: Family Medicine

## 2020-05-27 ENCOUNTER — Other Ambulatory Visit: Payer: Self-pay

## 2020-05-27 ENCOUNTER — Ambulatory Visit: Payer: Medicare Other

## 2020-05-27 DIAGNOSIS — M546 Pain in thoracic spine: Secondary | ICD-10-CM

## 2020-05-27 DIAGNOSIS — R29898 Other symptoms and signs involving the musculoskeletal system: Secondary | ICD-10-CM

## 2020-05-27 DIAGNOSIS — R293 Abnormal posture: Secondary | ICD-10-CM

## 2020-05-27 DIAGNOSIS — M62830 Muscle spasm of back: Secondary | ICD-10-CM

## 2020-05-27 NOTE — Therapy (Signed)
[facility] [address]  [place], [place], [address] Phone: [phone]   Fax:  [phone]  Physical Therapy Treatment  Patient Details  Name: Martin Rubio MRN: [id number] Date of Birth: Dec 31, 1945 Referring Provider (PT): Clearance Coots, MD   Encounter Date: 05/27/2020   PT End of Session - 05/27/20 0942    Visit Number 6    Number of Visits 10    Date for PT Re-Evaluation 06/20/20    Authorization Type Medicare & [employer] Life    PT Start Time [phone]    PT Stop Time 1015    PT Time Calculation (min) 39 min    Activity Tolerance Patient tolerated treatment well;Patient limited by pain    Behavior During Therapy [facility] for tasks assessed/performed           Past Medical History:  Diagnosis Date  . Bronchitis   . Diverticulosis   . FH: BPH (benign prostatic hypertrophy)   . History of Graves' disease 01/19/2018   S/p ablation  . HTN (hypertension)   . Hyperlipidemia   . Obesity   . Psoriatic arthritis ([place])   . Rheumatoid arthritis ([place]) 08/07/2014  . Sleep apnea    CPAP  . Thyroid disease     Past Surgical History:  Procedure Laterality Date  . INGUINAL HERNIA REPAIR     bilateral  . KNEE SURGERY     bilateral arthroscopic  . PACEMAKER IMPLANT N/A 12/13/2019   Procedure: PACEMAKER IMPLANT;  Surgeon: Evans Lance, MD;  Location: [facility];  Service: Cardiovascular;  Laterality: N/A;  . TRANSURETHRAL RESECTION OF PROSTATE    . UMBILICAL HERNIA REPAIR      There were no vitals filed for this visit.   Subjective Assessment - 05/27/20 0939    Subjective Pt reports he woke up in the middle of the night with 5/10 LBP and 8/10 R hip pain with some L hip pain. Pt is mostly compliant with HEP, but did not do HEP yesterday.    Pertinent History pacemaker, grave's disease, RA, psoriatic arthritis, HLD, HTN, B arthroscopic knee surgery    Diagnostic tests 04/25/20 lumbar xray: Mild degenerative disc  disease is noted at L3-4. No acuteabnormality seen in the lumbar spine.; 04/25/20 thoracic xray: No acute abnormality seen in the thoracic spine. Multilevel degenerative disc disease is noted.    Patient Stated Goals get rid of pain    Currently in Pain? Yes    Pain Score 2    5/10 pain at night   Pain Location Back    Pain Orientation Lower    Pain Descriptors / Indicators Dull    Pain Type Acute pain    Multiple Pain Sites Yes    Pain Score 2   8/10 at night, sharp pain   Pain Location Hip    Pain Orientation Right    Pain Descriptors / Indicators Dull                             [facility] Adult PT Treatment/Exercise - 05/27/20 0001      Lumbar Exercises: Stretches   Lower Trunk Rotation Limitations x10 B with manual OP    Pelvic Tilt 15 reps    Pelvic Tilt Limitations posterior (exhale) back to neutral (inhale)    Other Lumbar Stretch Exercise R/L open book in S/L with HBH x10 B with manual approximation of hips  Lumbar Exercises: Aerobic   UBE (Upper Arm Bike) L1.5x 39mn forward/397m back       Lumbar Exercises: Supine   Pelvic Tilt Limitations x15 (see under stretch)    Other Supine Lumbar Exercises femur arcs x6 B with breathing coordination, imprinted lumbar spine    Other Supine Lumbar Exercises supine ER with imprinted lumbar spine x 10      Manual Therapy   Manual Therapy Joint mobilization;Soft tissue mobilization    Manual therapy comments prone with pillow under hips and bolster under feet    Joint Mobilization B UPAs mid thoracic spine    Soft tissue mobilization R lumbar PS                    PT Short Term Goals - 05/22/20 0941      PT SHORT TERM GOAL #1   Title Pt will be independent with initial HEP.    Time 3    Period Weeks    Status Achieved    Target Date 05/30/20             PT Long Term Goals - 05/22/20 0942      PT LONG TERM GOAL #1   Title Pt will be independent with advanced HEP.    Time 6    Period Weeks     Status Partially Met   met for current     PT LONG TERM GOAL #2   Title Pt will demonstrate proper body mechanics to lift 20lb box to avoid further injury.    Time 6    Period Weeks    Status Partially Met   able to lift 10# with mild LBP     PT LONG TERM GOAL #3   Title Patient to demonstrate thoracic AROM WFL and without pain limiting.    Time 6    Period Weeks    Status On-going   proved in flexion, extension, SB     PT LONG TERM GOAL #4   Title Patient to recall/demonstrate postural correction at rest and with activity to improve postural awareness.    Time 6    Period Weeks    Status On-going   noting improved awareness of posture and able to correct himseld throughout the day     PT LONG TERM GOAL #5   Title Patient to report 75% improvement in pain.    Time 6    Period Weeks    Status On-going   reports 70% improvement in pain levels                Plan - 05/27/20 0943    Clinical Impression Statement Pt presents with increased LBP and R hip pain. He tolerated prone lying with pillow under hips for comfort and gentle rib rocking and thoracic UPAs (pain with PAs). Initiated supine pelvic tilting, femur arcs and isometric pelvic tilt with B shoulder ER + RTB. Prescribed S/L book opener for home in bed and on couch for incr thoracic stretching. Pt was educated on gentle and not forced to accomodate pain.    Comorbidities pacemaker, grave's disease, RA, psoriatic arthritis, HLD, HTN, B arthroscopic knee surgery    PT Treatment/Interventions ADLs/Self Care Home Management;Cryotherapy;Moist Heat;Balance training;Therapeutic exercise;Therapeutic activities;Functional mobility training;Stair training;Gait training;Neuromuscular re-education;Patient/family education;Manual techniques;Taping;Energy conservation;Dry needling;Passive range of motion    PT Next Visit Plan Central spinal PAs and STM to R lats, postural correction ther-ex; further MD/PT HEP review prn    Consulted  and Agree  with Plan of Care Patient           Patient will benefit from skilled therapeutic intervention in order to improve the following deficits and impairments:  Hypomobility, Decreased activity tolerance, Decreased strength, Increased fascial restricitons, Impaired UE functional use, Pain, Difficulty walking, Increased muscle spasms, Cardiopulmonary status limiting activity, Improper body mechanics, Decreased range of motion, Impaired flexibility, Postural dysfunction  Visit Diagnosis: Pain in thoracic spine  Muscle spasm of back  Abnormal posture  Other symptoms and signs involving the musculoskeletal system     Problem List Patient Active Problem List   Diagnosis Date Noted  . Acute right-sided low back pain without sciatica 04/25/2020  . Secondary hypercoagulable state ([place]) 03/05/2020  . Educated about COVID-19 virus infection 01/27/2020  . PCP notes >>>>>>>>>>>>>>> 01/10/2020  . Age related osteoporosis 01/10/2020  . CKD (chronic kidney disease), stage IV ([place]) 12/16/2019  . Pacemaker 12/16/2019  . Sick sinus syndrome ([place]) 12/16/2019  . Chest pain 12/15/2019  . Paroxysmal atrial fibrillation ([place]) 10/26/2019  . Dyslipidemia 09/14/2019  . Nodule of flexor tendon sheath 04/13/2019  . Hemorrhoids 06/24/2018  . Low bone mass 01/19/2018  . Iliac aneurysm ([place]) 06/16/2017  . Hypertrophic cardiomyopathy ([place]) 06/16/2017  . Essential hypertension 12/16/2016  . Hyperlipidemia LDL goal <100 12/16/2016  . Asthma in adult, mild intermittent, uncomplicated 32/35/5732  . Collapsed vertebra, not elsewhere classified, cervical region, initial encounter for fracture ([place]) 10/08/2016  . Neuropathy 10/08/2016  . Contracture of ankle and foot joint 08/07/2014  . Dysphagia 08/07/2014  . Esophageal reflux 08/07/2014  . Erectile dysfunction 08/07/2014  . Obstructive sleep apnea 08/07/2014  . Osteoarthritis 08/07/2014  . Rheumatoid arthritis ([place]) 08/07/2014  . Sensorineural  hearing loss 08/07/2014  . Sleep disorder 08/07/2014  . Tinnitus 08/07/2014  . LVH (left ventricular hypertrophy) 12/25/2012  . Diverticulosis 09/01/2011  . White coat hypertension 07/07/2011  . Hypothyroidism 06/09/2011  . Postablative hypothyroidism 06/09/2011  . Psoriatic arthritis ([place]) 06/09/2011  . Benign prostatic hyperplasia 06/09/2011    Izell Industry, PT, DPT 05/27/2020, 10:28 AM  [facility] [address]  [place], [place], [address] Phone: [phone]   Fax:  [phone]  Name: Martin Rubio MRN: [id number] Date of Birth: 22-Mar-1946

## 2020-05-29 ENCOUNTER — Other Ambulatory Visit: Payer: Self-pay

## 2020-05-29 ENCOUNTER — Ambulatory Visit: Payer: Medicare Other | Admitting: Physical Therapy

## 2020-05-29 ENCOUNTER — Encounter: Payer: Self-pay | Admitting: Physical Therapy

## 2020-05-29 DIAGNOSIS — M62830 Muscle spasm of back: Secondary | ICD-10-CM

## 2020-05-29 DIAGNOSIS — M546 Pain in thoracic spine: Secondary | ICD-10-CM

## 2020-05-29 DIAGNOSIS — R29898 Other symptoms and signs involving the musculoskeletal system: Secondary | ICD-10-CM

## 2020-05-29 DIAGNOSIS — R293 Abnormal posture: Secondary | ICD-10-CM

## 2020-05-29 NOTE — Therapy (Signed)
[facility] [address]  [place], [place], [address] Phone: [phone]   Fax:  [phone]  Physical Therapy Treatment  Patient Details  Name: Martin Rubio MRN: [id number] Date of Birth: Sep 09, 1946 Referring Provider (PT): Clearance Coots, MD   Encounter Date: 05/29/2020   PT End of Session - 05/29/20 1021    Visit Number 7    Number of Visits 10    Date for PT Re-Evaluation 06/20/20    Authorization Type Medicare & [employer] Life    PT Start Time 0932    PT Stop Time 1014    PT Time Calculation (min) 42 min    Activity Tolerance Patient tolerated treatment well;Patient limited by pain    Behavior During Therapy [facility] for tasks assessed/performed           Past Medical History:  Diagnosis Date  . Bronchitis   . Diverticulosis   . FH: BPH (benign prostatic hypertrophy)   . History of Graves' disease 01/19/2018   S/p ablation  . HTN (hypertension)   . Hyperlipidemia   . Obesity   . Psoriatic arthritis ([place])   . Rheumatoid arthritis ([place]) 08/07/2014  . Sleep apnea    CPAP  . Thyroid disease     Past Surgical History:  Procedure Laterality Date  . INGUINAL HERNIA REPAIR     bilateral  . KNEE SURGERY     bilateral arthroscopic  . PACEMAKER IMPLANT N/A 12/13/2019   Procedure: PACEMAKER IMPLANT;  Surgeon: Evans Lance, MD;  Location: [place] [facility];  Service: Cardiovascular;  Laterality: N/A;  . TRANSURETHRAL RESECTION OF PROSTATE    . UMBILICAL HERNIA REPAIR      There were no vitals filed for this visit.   Subjective Assessment - 05/29/20 0933    Subjective "I'm hurting today." More pain in the R hip and LB. Performed all his HEP exercises yesterday.    Pertinent History pacemaker, grave's disease, RA, psoriatic arthritis, HLD, HTN, B arthroscopic knee surgery    Diagnostic tests 04/25/20 lumbar xray: Mild degenerative disc disease is noted at L3-4. No acuteabnormality seen in the lumbar spine.;  04/25/20 thoracic xray: No acute abnormality seen in the thoracic spine. Multilevel degenerative disc disease is noted.    Patient Stated Goals get rid of pain    Currently in Pain? Yes    Pain Score 2     Pain Location Back    Pain Orientation Lower    Pain Descriptors / Indicators Dull    Pain Type Acute pain    Pain Score 3    Pain Location Hip    Pain Orientation Lateral;Right    Pain Descriptors / Indicators Dull    Pain Type Acute pain    Pain Radiating Towards down to knees                             [facility] Adult PT Treatment/Exercise - 05/29/20 0001      Self-Care   Self-Care Other Self-Care Comments    Other Self-Care Comments  edu and practice using tennis ball on wall massage to R rhomboids and lumbar paraspinals      Lumbar Exercises: Stretches   Lower Trunk Rotation Limitations x20 to tolerance    Figure 4 Stretch 1 rep;30 seconds;With overpressure;Supine   more dififculty on R   Figure 4 Stretch Limitations to tolerance; cues to avoid pushing into pain  Lumbar Exercises: Aerobic   UBE (Upper Arm Bike) L1.0x 52mn forward/326m back       Lumbar Exercises: Machines for Strengthening   Other Lumbar Machine Exercise narrow grip machine row 10x 20#   cues for scapular retraction and neutral cervical spine   Other Lumbar Machine Exercise lat pulldown with wide grip x10 15#   discontonued d/t L shoulder pain     Manual Therapy   Manual Therapy Soft tissue mobilization;Myofascial release    Manual therapy comments prone    Joint Mobilization grade II/III central PAs along thoracic segments- most tenderness and hypomobility at T7    Soft tissue mobilization STM to R thoracic and lumbar paraspinals, rhomboids, proximal lats and infraspinatus, QL- tenderness and soft tissue restriction evident throughout    Myofascial Release manual TPR to R proximal lats/infraspinatus                  PT Education - 05/29/20 1021    Education Details  update to HEAvery DennisonEducated Patient    Methods Explanation;Demonstration;Tactile cues;Verbal cues;Handout    Comprehension Verbalized understanding;Returned demonstration            PT Short Term Goals - 05/22/20 0941      PT SHORT TERM GOAL #1   Title Pt will be independent with initial HEP.    Time 3    Period Weeks    Status Achieved    Target Date 05/30/20             PT Long Term Goals - 05/22/20 0942      PT LONG TERM GOAL #1   Title Pt will be independent with advanced HEP.    Time 6    Period Weeks    Status Partially Met   met for current     PT LONG TERM GOAL #2   Title Pt will demonstrate proper body mechanics to lift 20lb box to avoid further injury.    Time 6    Period Weeks    Status Partially Met   able to lift 10# with mild LBP     PT LONG TERM GOAL #3   Title Patient to demonstrate thoracic AROM WFL and without pain limiting.    Time 6    Period Weeks    Status On-going   proved in flexion, extension, SB     PT LONG TERM GOAL #4   Title Patient to recall/demonstrate postural correction at rest and with activity to improve postural awareness.    Time 6    Period Weeks    Status On-going   noting improved awareness of posture and able to correct himseld throughout the day     PT LONG TERM GOAL #5   Title Patient to report 75% improvement in pain.    Time 6    Period Weeks    Status On-going   reports 70% improvement in pain levels                Plan - 05/29/20 1022    Clinical Impression Statement Patient reporting increased R hip and LBP today without known cause. Worked on gentle lumbopelvic and hip stretching to allow for improved exercise tolerance d/t patient's complaints. Updated figure 4 stretch into HEP as patient reported good tolerance. Worked on light machine periscapular strengthening with intermittent cueing for proper alignment and encouragement on scapular retraction. Lat pulldown discontinued d/t L shoulder pain.  Proceeded with MT to address R-sided soft  tissue restriction, trigger points, and pain. Educated patient on self-massage using tennis ball on wall for pain management at home. Patient reported understanding and without complaints at end of session.    Comorbidities pacemaker, grave's disease, RA, psoriatic arthritis, HLD, HTN, B arthroscopic knee surgery    PT Treatment/Interventions ADLs/Self Care Home Management;Cryotherapy;Moist Heat;Balance training;Therapeutic exercise;Therapeutic activities;Functional mobility training;Stair training;Gait training;Neuromuscular re-education;Patient/family education;Manual techniques;Taping;Energy conservation;Dry needling;Passive range of motion    PT Next Visit Plan Central spinal PAs and STM to R lats, postural correction ther-ex; further MD/PT HEP review prn    Consulted and Agree with Plan of Care Patient           Patient will benefit from skilled therapeutic intervention in order to improve the following deficits and impairments:  Hypomobility, Decreased activity tolerance, Decreased strength, Increased fascial restricitons, Impaired UE functional use, Pain, Difficulty walking, Increased muscle spasms, Cardiopulmonary status limiting activity, Improper body mechanics, Decreased range of motion, Impaired flexibility, Postural dysfunction  Visit Diagnosis: Pain in thoracic spine  Muscle spasm of back  Abnormal posture  Other symptoms and signs involving the musculoskeletal system     Problem List Patient Active Problem List   Diagnosis Date Noted  . Acute right-sided low back pain without sciatica 04/25/2020  . Secondary hypercoagulable state ([place]) 03/05/2020  . Educated about COVID-19 virus infection 01/27/2020  . PCP notes >>>>>>>>>>>>>>> 01/10/2020  . Age related osteoporosis 01/10/2020  . CKD (chronic kidney disease), stage IV ([place]) 12/16/2019  . Pacemaker 12/16/2019  . Sick sinus syndrome ([place]) 12/16/2019  . Chest pain 12/15/2019  .  Paroxysmal atrial fibrillation ([place]) 10/26/2019  . Dyslipidemia 09/14/2019  . Nodule of flexor tendon sheath 04/13/2019  . Hemorrhoids 06/24/2018  . Low bone mass 01/19/2018  . Iliac aneurysm ([place]) 06/16/2017  . Hypertrophic cardiomyopathy ([place]) 06/16/2017  . Essential hypertension 12/16/2016  . Hyperlipidemia LDL goal <100 12/16/2016  . Asthma in adult, mild intermittent, uncomplicated 50/56/9794  . Collapsed vertebra, not elsewhere classified, cervical region, initial encounter for fracture ([place]) 10/08/2016  . Neuropathy 10/08/2016  . Contracture of ankle and foot joint 08/07/2014  . Dysphagia 08/07/2014  . Esophageal reflux 08/07/2014  . Erectile dysfunction 08/07/2014  . Obstructive sleep apnea 08/07/2014  . Osteoarthritis 08/07/2014  . Rheumatoid arthritis ([place]) 08/07/2014  . Sensorineural hearing loss 08/07/2014  . Sleep disorder 08/07/2014  . Tinnitus 08/07/2014  . LVH (left ventricular hypertrophy) 12/25/2012  . Diverticulosis 09/01/2011  . White coat hypertension 07/07/2011  . Hypothyroidism 06/09/2011  . Postablative hypothyroidism 06/09/2011  . Psoriatic arthritis ([place]) 06/09/2011  . Benign prostatic hyperplasia 06/09/2011     Janene Harvey, PT, DPT 05/29/20 10:23 AM   [facility] [address]  [place], [place], [address] Phone: [phone]   Fax:  [phone]  Name: Martin Rubio MRN: [id number] Date of Birth: 19-Mar-1946

## 2020-05-31 ENCOUNTER — Other Ambulatory Visit: Payer: Self-pay

## 2020-05-31 ENCOUNTER — Ambulatory Visit (INDEPENDENT_AMBULATORY_CARE_PROVIDER_SITE_OTHER): Payer: Medicare Other | Admitting: Family Medicine

## 2020-05-31 DIAGNOSIS — M545 Low back pain, unspecified: Secondary | ICD-10-CM

## 2020-05-31 MED ORDER — GABAPENTIN 100 MG PO CAPS: 100.0000 mg | ORAL_CAPSULE | Freq: Three times a day (TID) | ORAL | 1 refills | Status: DC | PRN

## 2020-05-31 NOTE — Progress Notes (Signed)
Martin Rubio - 74 y.o. male MRN [id number]  Date of birth: 01-05-1946  SUBJECTIVE:  Including CC & ROS.  No chief complaint on file.   Martin Rubio is a 74 y.o. male that is following up for his back pain.  The pain is still ongoing and is occurring over the right thoracic and lumbar area.  It is intermittent in nature.  He has been taking Tylenol and the muscle relaxer.  Physical therapy does seem to be helping.   Review of Systems See HPI   HISTORY: Past Medical, Surgical, Social, and Family History Reviewed & Updated per EMR.   Pertinent Historical Findings include:  Past Medical History:  Diagnosis Date  . Bronchitis   . Diverticulosis   . FH: BPH (benign prostatic hypertrophy)   . History of Graves' disease 01/19/2018   S/p ablation  . HTN (hypertension)   . Hyperlipidemia   . Obesity   . Psoriatic arthritis ([place])   . Rheumatoid arthritis ([place]) 08/07/2014  . Sleep apnea    CPAP  . Thyroid disease     Past Surgical History:  Procedure Laterality Date  . INGUINAL HERNIA REPAIR     bilateral  . KNEE SURGERY     bilateral arthroscopic  . PACEMAKER IMPLANT N/A 12/13/2019   Procedure: PACEMAKER IMPLANT;  Surgeon: Evans Lance, MD;  Location: [place] [facility];  Service: Cardiovascular;  Laterality: N/A;  . TRANSURETHRAL RESECTION OF PROSTATE    . UMBILICAL HERNIA REPAIR      Family History  Problem Relation Age of Onset  . CAD Father 23       Died age 24  . CAD Mother 16       CABG  . Diabetes Brother     Social History   Socioeconomic History  . Marital status: Married    Spouse name: Not on file  . Number of children: 2  . Years of education: Not on file  . Highest education level: Not on file  Occupational History  . Occupation: Retired    Fish farm manager: [employer]  Tobacco Use  . Smoking status: Former Smoker    Types: Cigarettes  . Smokeless tobacco: Never Used  . Tobacco comment: Quit 30 years ago.  Vaping Use  . Vaping Use: Never used    Substance and Sexual Activity  . Alcohol use: No  . Drug use: No  . Sexual activity: Not on file  Other Topics Concern  . Not on file  Social History Narrative   Lives with wife.   Social Determinants of Health   Financial Resource Strain:   . Difficulty of Paying Living Expenses:   Food Insecurity:   . Worried About Charity fundraiser in the Last Year:   . Arboriculturist in the Last Year:   Transportation Needs:   . Film/video editor (Medical):   Marland Kitchen Lack of Transportation (Non-Medical):   Physical Activity:   . Days of Exercise per Week:   . Minutes of Exercise per Session:   Stress:   . Feeling of Stress :   Social Connections:   . Frequency of Communication with Friends and Family:   . Frequency of Social Gatherings with Friends and Family:   . Attends Religious Services:   . Active Member of Clubs or Organizations:   . Attends Archivist Meetings:   Marland Kitchen Marital Status:   Intimate Partner Violence:   . Fear of Current or Ex-Partner:   .  Emotionally Abused:   Marland Kitchen Physically Abused:   . Sexually Abused:      PHYSICAL EXAM:  VS: BP (!) 150/80   Ht 5\' 10"  (1.778 m)   Wt 212 lb (96.2 kg)   BMI 30.42 kg/m  Physical Exam Gen: NAD, alert, cooperative with exam, well-appearing MSK:  Back: Tenderness palpation over the right SI joint. Some tenderness to palpation at the inferior border of the scapula. No tenderness midline of the back. Normal strength resistance. Neurovascularly intact     ASSESSMENT & PLAN:   Acute right-sided low back pain without sciatica Pain is still ongoing.  He does get improvement with physical therapy.  There may be a component of SI dysfunction with pain over that area.  Avoiding anti-inflammatories with taking Eliquis. -Counseled on home exercise therapy and supportive care. -Initiate gabapentin. -Continue physical therapy. -Could consider further imaging or right SI joint injection.

## 2020-05-31 NOTE — Patient Instructions (Signed)
Good to see you Please try heat on the back  Please try the voltaren on the back  Please try the gabapentin. Please try one pill at night and see how you tolerate it. You can increase to 2 or 3 times daily as you tolerate.   Please send me a message in MyChart with any questions or updates.  Please see me back in 6 weeks.   --Dr. Raeford Razor

## 2020-05-31 NOTE — Assessment & Plan Note (Addendum)
Pain is still ongoing.  He does get improvement with physical therapy.  There may be a component of SI dysfunction with pain over that area.  Avoiding anti-inflammatories with taking Eliquis. -Counseled on home exercise therapy and supportive care. -Initiate gabapentin. -Continue physical therapy. -Could consider further imaging or right SI joint injection.

## 2020-06-05 ENCOUNTER — Encounter: Payer: Self-pay | Admitting: Physical Therapy

## 2020-06-05 ENCOUNTER — Ambulatory Visit: Payer: Medicare Other | Admitting: Physical Therapy

## 2020-06-05 ENCOUNTER — Other Ambulatory Visit: Payer: Self-pay

## 2020-06-05 DIAGNOSIS — R29898 Other symptoms and signs involving the musculoskeletal system: Secondary | ICD-10-CM

## 2020-06-05 DIAGNOSIS — R293 Abnormal posture: Secondary | ICD-10-CM

## 2020-06-05 DIAGNOSIS — M546 Pain in thoracic spine: Secondary | ICD-10-CM

## 2020-06-05 DIAGNOSIS — M62830 Muscle spasm of back: Secondary | ICD-10-CM

## 2020-06-05 NOTE — Therapy (Signed)
[facility] [address]  [place], [place], [address] Phone: [phone]   Fax:  [phone]  Physical Therapy Treatment  Patient Details  Name: Martin Rubio MRN: [id number] Date of Birth: October 28, 1946 Referring Provider (PT): Clearance Coots, MD   Encounter Date: 06/05/2020   PT End of Session - 06/05/20 1014    Visit Number 8    Number of Visits 10    Date for PT Re-Evaluation 06/20/20    Authorization Type Medicare & [employer] Life    PT Start Time [phone]   pt late   PT Stop Time 1014    PT Time Calculation (min) 38 min    Activity Tolerance Patient tolerated treatment well    Behavior During Therapy [name] for tasks assessed/performed           Past Medical History:  Diagnosis Date  . Bronchitis   . Diverticulosis   . FH: BPH (benign prostatic hypertrophy)   . History of Graves' disease 01/19/2018   S/p ablation  . HTN (hypertension)   . Hyperlipidemia   . Obesity   . Psoriatic arthritis ([place])   . Rheumatoid arthritis ([place]) 08/07/2014  . Sleep apnea    CPAP  . Thyroid disease     Past Surgical History:  Procedure Laterality Date  . INGUINAL HERNIA REPAIR     bilateral  . KNEE SURGERY     bilateral arthroscopic  . PACEMAKER IMPLANT N/A 12/13/2019   Procedure: PACEMAKER IMPLANT;  Surgeon: Evans Lance, MD;  Location: [place] [facility];  Service: Cardiovascular;  Laterality: N/A;  . TRANSURETHRAL RESECTION OF PROSTATE    . UMBILICAL HERNIA REPAIR      There were no vitals filed for this visit.   Subjective Assessment - 06/05/20 0940    Subjective Having more pain in the LB today. Put some Voltaren on his knee and LB.    Pertinent History pacemaker, grave's disease, RA, psoriatic arthritis, HLD, HTN, B arthroscopic knee surgery    Diagnostic tests 04/25/20 lumbar xray: Mild degenerative disc disease is noted at L3-4. No acuteabnormality seen in the lumbar spine.; 04/25/20 thoracic xray: No acute  abnormality seen in the thoracic spine. Multilevel degenerative disc disease is noted.    Patient Stated Goals get rid of pain    Currently in Pain? Yes    Pain Score 2     Pain Location Back    Pain Orientation Right;Left;Lower    Pain Descriptors / Indicators Dull    Pain Type Acute pain                             [facility] Adult PT Treatment/Exercise - 06/05/20 0001      Exercises   Exercises Lumbar      Lumbar Exercises: Stretches   Passive Hamstring Stretch Right;Left;30 seconds;2 reps    Passive Hamstring Stretch Limitations --   supine with strap   Lower Trunk Rotation Limitations x20 to tolerance    Hip Flexor Stretch Right;Left;30 seconds;2 reps    Hip Flexor Stretch Limitations mod thomas with strap   cueing to avoid pushing into pain      Lumbar Exercises: Aerobic   Nustep Lvl 3, 6 min (UE/LE)      Lumbar Exercises: Seated   Other Seated Lumbar Exercises B shoulder ER with red TB x10   towel rolls under elbows for neutral position  Lumbar Exercises: Supine   Other Supine Lumbar Exercises B shoulder horizontal abduction x10 yellow TB   unable to perform in sitting d/t form     Lumbar Exercises: Sidelying   Hip Abduction Right;Left;10 reps    Hip Abduction Weights (lbs) good alignment      Lumbar Exercises: Prone   Other Prone Lumbar Exercises prone on elbows 10x5"   cues to encourage upright chest                 PT Education - 06/05/20 1012    Education Details update to HEP    Person(s) Educated Patient    Methods Explanation;Demonstration;Tactile cues;Verbal cues;Handout    Comprehension Verbalized understanding;Returned demonstration            PT Short Term Goals - 05/22/20 0941      PT SHORT TERM GOAL #1   Title Pt will be independent with initial HEP.    Time 3    Period Weeks    Status Achieved    Target Date 05/30/20             PT Long Term Goals - 05/22/20 0942      PT LONG TERM GOAL #1   Title Pt will  be independent with advanced HEP.    Time 6    Period Weeks    Status Partially Met   met for current     PT LONG TERM GOAL #2   Title Pt will demonstrate proper body mechanics to lift 20lb box to avoid further injury.    Time 6    Period Weeks    Status Partially Met   able to lift 10# with mild LBP     PT LONG TERM GOAL #3   Title Patient to demonstrate thoracic AROM WFL and without pain limiting.    Time 6    Period Weeks    Status On-going   proved in flexion, extension, SB     PT LONG TERM GOAL #4   Title Patient to recall/demonstrate postural correction at rest and with activity to improve postural awareness.    Time 6    Period Weeks    Status On-going   noting improved awareness of posture and able to correct himseld throughout the day     PT LONG TERM GOAL #5   Title Patient to report 75% improvement in pain.    Time 6    Period Weeks    Status On-going   reports 70% improvement in pain levels                Plan - 06/05/20 1015    Clinical Impression Statement Patient reporting increased LBP this AM without known cause. Worked on lumbopelvic ROM and LE stretching to patient's tolerance. Patient required consistent cueing to avoid pushing into pain with hip flexor stretching. Patient tolerated prone on elbows without complaints. Did require cueing to maintain upright chest to encourage improved ROM. Reported improved in LB after mat ther-ex, thus updated HEP with prone on elbows for hopeful pain relief at home. Patient reported understanding. Proceeded with periscapular strengthening with patient demonstrating better form with ER vs. horizontal abduction d/t weakness. Patient reported relief of LBP and "I can walk a little better" at end of session.    Comorbidities pacemaker, grave's disease, RA, psoriatic arthritis, HLD, HTN, B arthroscopic knee surgery    PT Treatment/Interventions ADLs/Self Care Home Management;Cryotherapy;Moist Heat;Balance training;Therapeutic  exercise;Therapeutic activities;Functional mobility training;Stair training;Gait training;Neuromuscular re-education;Patient/family education;Manual  techniques;Taping;Energy conservation;Dry needling;Passive range of motion    PT Next Visit Plan Central spinal PAs and STM to R lats, postural correction ther-ex; further MD/PT HEP review prn    Consulted and Agree with Plan of Care Patient           Patient will benefit from skilled therapeutic intervention in order to improve the following deficits and impairments:  Hypomobility, Decreased activity tolerance, Decreased strength, Increased fascial restricitons, Impaired UE functional use, Pain, Difficulty walking, Increased muscle spasms, Cardiopulmonary status limiting activity, Improper body mechanics, Decreased range of motion, Impaired flexibility, Postural dysfunction  Visit Diagnosis: Pain in thoracic spine  Muscle spasm of back  Abnormal posture  Other symptoms and signs involving the musculoskeletal system     Problem List Patient Active Problem List   Diagnosis Date Noted  . Acute right-sided low back pain without sciatica 04/25/2020  . Secondary hypercoagulable state ([place]) 03/05/2020  . Educated about COVID-19 virus infection 01/27/2020  . PCP notes >>>>>>>>>>>>>>> 01/10/2020  . Age related osteoporosis 01/10/2020  . CKD (chronic kidney disease), stage IV ([place]) 12/16/2019  . Pacemaker 12/16/2019  . Sick sinus syndrome ([place]) 12/16/2019  . Chest pain 12/15/2019  . Paroxysmal atrial fibrillation ([place]) 10/26/2019  . Dyslipidemia 09/14/2019  . Nodule of flexor tendon sheath 04/13/2019  . Hemorrhoids 06/24/2018  . Low bone mass 01/19/2018  . Iliac aneurysm ([place]) 06/16/2017  . Hypertrophic cardiomyopathy ([place]) 06/16/2017  . Essential hypertension 12/16/2016  . Hyperlipidemia LDL goal <100 12/16/2016  . Asthma in adult, mild intermittent, uncomplicated 76/28/3151  . Collapsed vertebra, not elsewhere classified, cervical  region, initial encounter for fracture ([place]) 10/08/2016  . Neuropathy 10/08/2016  . Contracture of ankle and foot joint 08/07/2014  . Dysphagia 08/07/2014  . Esophageal reflux 08/07/2014  . Erectile dysfunction 08/07/2014  . Obstructive sleep apnea 08/07/2014  . Osteoarthritis 08/07/2014  . Rheumatoid arthritis ([place]) 08/07/2014  . Sensorineural hearing loss 08/07/2014  . Sleep disorder 08/07/2014  . Tinnitus 08/07/2014  . LVH (left ventricular hypertrophy) 12/25/2012  . Diverticulosis 09/01/2011  . White coat hypertension 07/07/2011  . Hypothyroidism 06/09/2011  . Postablative hypothyroidism 06/09/2011  . Psoriatic arthritis ([place]) 06/09/2011  . Benign prostatic hyperplasia 06/09/2011     Janene Harvey, PT, DPT 06/05/20 10:21 AM   [facility] [address]  [place], [place], [address] Phone: [phone]   Fax:  [phone]  Name: Martin Rubio MRN: [id number] Date of Birth: [birth year]

## 2020-06-12 ENCOUNTER — Ambulatory Visit: Payer: Medicare Other | Attending: Family Medicine | Admitting: Physical Therapy

## 2020-06-12 ENCOUNTER — Other Ambulatory Visit: Payer: Self-pay

## 2020-06-12 ENCOUNTER — Encounter: Payer: Self-pay | Admitting: Physical Therapy

## 2020-06-12 DIAGNOSIS — M546 Pain in thoracic spine: Secondary | ICD-10-CM | POA: Diagnosis not present

## 2020-06-12 DIAGNOSIS — M62830 Muscle spasm of back: Secondary | ICD-10-CM | POA: Diagnosis not present

## 2020-06-12 DIAGNOSIS — R29898 Other symptoms and signs involving the musculoskeletal system: Secondary | ICD-10-CM | POA: Insufficient documentation

## 2020-06-12 DIAGNOSIS — R293 Abnormal posture: Secondary | ICD-10-CM | POA: Diagnosis not present

## 2020-06-12 NOTE — Therapy (Signed)
[facility] [address]  [place], [place], [address] Phone: [phone]   Fax:  [phone]  Physical Therapy Treatment  Patient Details  Name: Martin Rubio MRN: [id number] Date of Birth: 1946/02/27 Referring Provider (PT): Clearance Coots, MD   Encounter Date: 06/12/2020   PT End of Session - 06/12/20 1011    Visit Number 9    Number of Visits 10    Date for PT Re-Evaluation 06/20/20    Authorization Type Medicare & [employer]    PT Start Time 0935    PT Stop Time 1021   moist heat   PT Time Calculation (min) 46 min    Activity Tolerance Patient tolerated treatment well;Patient limited by pain    Behavior During Therapy [facility] for tasks assessed/performed           Past Medical History:  Diagnosis Date   Bronchitis    Diverticulosis    FH: BPH (benign prostatic hypertrophy)    History of Graves' disease 01/19/2018   S/p ablation   HTN (hypertension)    Hyperlipidemia    Obesity    Psoriatic arthritis ([place])    Rheumatoid arthritis ([place]) 08/07/2014   Sleep apnea    CPAP   Thyroid disease     Past Surgical History:  Procedure Laterality Date   INGUINAL HERNIA REPAIR     bilateral   KNEE SURGERY     bilateral arthroscopic   PACEMAKER IMPLANT N/A 12/13/2019   Procedure: PACEMAKER IMPLANT;  Surgeon: Evans Lance, MD;  Location: [facility];  Service: Cardiovascular;  Laterality: N/A;   TRANSURETHRAL RESECTION OF PROSTATE     UMBILICAL HERNIA REPAIR      There were no vitals filed for this visit.   Subjective Assessment - 06/12/20 0938    Subjective Would like to F/U with a surgeon at [facility] d/t worsening LBP. Having trouble sleeping and not getting any relief from HEP. Unsure of the cause of his pain at this time d/t not having performed any strenuous lifting. Has attempted using a "video program" to fix his LBP with some worsening of his pain.    Pertinent History  pacemaker, grave's disease, RA, psoriatic arthritis, HLD, HTN, B arthroscopic knee surgery    Diagnostic tests 04/25/20 lumbar xray: Mild degenerative disc disease is noted at L3-4. No acuteabnormality seen in the lumbar spine.; 04/25/20 thoracic xray: No acute abnormality seen in the thoracic spine. Multilevel degenerative disc disease is noted.    Patient Stated Goals get rid of pain    Currently in Pain? Yes    Pain Score 4     Pain Location Back    Pain Orientation Right;Left;Lower    Pain Descriptors / Indicators Aching;Sharp    Pain Type Acute pain                             [facility] Adult PT Treatment/Exercise - 06/12/20 0001      Lumbar Exercises: Stretches   Passive Hamstring Stretch Right;Left;30 seconds;1 rep    Passive Hamstring Stretch Limitations supine with strap   cues to avoid pushing into pain   Single Knee to Chest Stretch Right;Left;1 rep;30 seconds    Single Knee to Chest Stretch Limitations with strap assist       Lumbar Exercises: Aerobic   Nustep Lvl 4, 6 min (UE/LE)      Lumbar Exercises: Machines for  Strengthening   Other Lumbar Machine Exercise narrow grip machine row 10x 20#   cues to decrease speed     Lumbar Exercises: Standing   Wall Slides 10 reps    Wall Slides Limitations mini wall squat to tolerance      Lumbar Exercises: Supine   Bridge 10 reps    Bridge Limitations limited ROM      Lumbar Exercises: Sidelying   Other Sidelying Lumbar Exercises open book stretch x10 each side    cueing to avoid pushing into pain and compensation     Moist Heat Therapy   Number Minutes Moist Heat 10 Minutes    Moist Heat Location Lumbar Spine                    PT Short Term Goals - 05/22/20 0941      PT SHORT TERM GOAL #1   Title Pt will be independent with initial HEP.    Time 3    Period Weeks    Status Achieved    Target Date 05/30/20             PT Long Term Goals - 05/22/20 0942      PT LONG TERM GOAL #1    Title Pt will be independent with advanced HEP.    Time 6    Period Weeks    Status Partially Met   met for current     PT LONG TERM GOAL #2   Title Pt will demonstrate proper body mechanics to lift 20lb box to avoid further injury.    Time 6    Period Weeks    Status Partially Met   able to lift 10# with mild LBP     PT LONG TERM GOAL #3   Title Patient to demonstrate thoracic AROM WFL and without pain limiting.    Time 6    Period Weeks    Status On-going   proved in flexion, extension, SB     PT LONG TERM GOAL #4   Title Patient to recall/demonstrate postural correction at rest and with activity to improve postural awareness.    Time 6    Period Weeks    Status On-going   noting improved awareness of posture and able to correct himseld throughout the day     PT LONG TERM GOAL #5   Title Patient to report 75% improvement in pain.    Time 6    Period Weeks    Status On-going   reports 70% improvement in pain levels                Plan - 06/12/20 1012    Clinical Impression Statement Patient arrived to session with report of worsening LBP to the point of having trouble sleeping and not getting any relief from HEP. Also notes worsening of his pain after performing an online video program to assist with LBP with report of worsening of pain.  Notes that he is planning to f/u a Psychologist, sport and exercise. Patient with c/o LBP with transfers and bed mobility while performing mat ther-ex. Worked on gentle LE stretching with report of increased difficulty on R vs. L LE d/t hip pain. Limited ROM demonstrated with bridges d/t weakness. Patient frequently required cueing to avoid compensations and pushing into pain with ther-ex today. Ended session with moist heat to LB for pain relief. Patient reporting LBP no worse at end of session.    Comorbidities pacemaker, grave's disease, RA, psoriatic arthritis, HLD,  HTN, B arthroscopic knee surgery    PT Treatment/Interventions ADLs/Self Care Home  Management;Cryotherapy;Moist Heat;Balance training;Therapeutic exercise;Therapeutic activities;Functional mobility training;Stair training;Gait training;Neuromuscular re-education;Patient/family education;Manual techniques;Taping;Energy conservation;Dry needling;Passive range of motion    PT Next Visit Plan Central spinal PAs and STM to R lats, postural correction ther-ex; further MD/PT HEP review prn    Consulted and Agree with Plan of Care Patient           Patient will benefit from skilled therapeutic intervention in order to improve the following deficits and impairments:  Hypomobility, Decreased activity tolerance, Decreased strength, Increased fascial restricitons, Impaired UE functional use, Pain, Difficulty walking, Increased muscle spasms, Cardiopulmonary status limiting activity, Improper body mechanics, Decreased range of motion, Impaired flexibility, Postural dysfunction  Visit Diagnosis: Pain in thoracic spine  Muscle spasm of back  Abnormal posture  Other symptoms and signs involving the musculoskeletal system     Problem List Patient Active Problem List   Diagnosis Date Noted   Acute right-sided low back pain without sciatica 04/25/2020   Secondary hypercoagulable state ([place]) 03/05/2020   Educated about COVID-19 virus infection 01/27/2020   PCP notes >>>>>>>>>>>>>>> 01/10/2020   Age related osteoporosis 01/10/2020   CKD (chronic kidney disease), stage IV (HCC) 12/16/2019   Pacemaker 12/16/2019   Sick sinus syndrome (HCC) 12/16/2019   Chest pain 12/15/2019   Paroxysmal atrial fibrillation (HCC) 10/26/2019   Dyslipidemia 09/14/2019   Nodule of flexor tendon sheath 04/13/2019   Hemorrhoids 06/24/2018   Low bone mass 01/19/2018   Iliac aneurysm ([place]) 06/16/2017   Hypertrophic cardiomyopathy ([place]) 06/16/2017   Essential hypertension 12/16/2016   Hyperlipidemia LDL goal <100 12/16/2016   Asthma in adult, mild intermittent, uncomplicated  96/03/5408   Collapsed vertebra, not elsewhere classified, cervical region, initial encounter for fracture ([place]) 10/08/2016   Neuropathy 10/08/2016   Contracture of ankle and foot joint 08/07/2014   Dysphagia 08/07/2014   Esophageal reflux 08/07/2014   Erectile dysfunction 08/07/2014   Obstructive sleep apnea 08/07/2014   Osteoarthritis 08/07/2014   Rheumatoid arthritis ([place]) 08/07/2014   Sensorineural hearing loss 08/07/2014   Sleep disorder 08/07/2014   Tinnitus 08/07/2014   LVH (left ventricular hypertrophy) 12/25/2012   Diverticulosis 09/01/2011   White coat hypertension 07/07/2011   Hypothyroidism 06/09/2011   Postablative hypothyroidism 06/09/2011   Psoriatic arthritis ([place]) 06/09/2011   Benign prostatic hyperplasia 06/09/2011     Janene Harvey, PT, DPT 06/12/20 12:06 PM   [facility] [address]  [place], [place], [address] Phone: [phone]   Fax:  [phone]  Name: Martin Rubio MRN: [id number] Date of Birth: 07-12-1946

## 2020-06-13 ENCOUNTER — Ambulatory Visit (INDEPENDENT_AMBULATORY_CARE_PROVIDER_SITE_OTHER): Payer: Medicare Other | Admitting: *Deleted

## 2020-06-13 DIAGNOSIS — I495 Sick sinus syndrome: Secondary | ICD-10-CM

## 2020-06-13 LAB — CUP PACEART REMOTE DEVICE CHECK
Battery Remaining Longevity: 135 mo
Battery Voltage: 3.14 V
Brady Statistic AP VP Percent: 5.05 %
Brady Statistic AP VS Percent: 53.38 %
Brady Statistic AS VP Percent: 0.21 %
Brady Statistic AS VS Percent: 41.35 %
Brady Statistic RA Percent Paced: 58.06 %
Brady Statistic RV Percent Paced: 5.27 %
Date Time Interrogation Session: 20210707220724
Implantable Lead Implant Date: 20210106
Implantable Lead Implant Date: 20210106
Implantable Lead Location: 753859
Implantable Lead Location: 753860
Implantable Lead Model: 3830
Implantable Lead Model: 5076
Implantable Pulse Generator Implant Date: 20210106
Lead Channel Impedance Value: 304 Ohm
Lead Channel Impedance Value: 342 Ohm
Lead Channel Impedance Value: 494 Ohm
Lead Channel Impedance Value: 532 Ohm
Lead Channel Pacing Threshold Amplitude: 0.5 V
Lead Channel Pacing Threshold Amplitude: 0.75 V
Lead Channel Pacing Threshold Pulse Width: 0.4 ms
Lead Channel Pacing Threshold Pulse Width: 0.4 ms
Lead Channel Sensing Intrinsic Amplitude: 1.625 mV
Lead Channel Sensing Intrinsic Amplitude: 1.625 mV
Lead Channel Sensing Intrinsic Amplitude: 28.875 mV
Lead Channel Sensing Intrinsic Amplitude: 28.875 mV
Lead Channel Setting Pacing Amplitude: 1.5 V
Lead Channel Setting Pacing Amplitude: 2.5 V
Lead Channel Setting Pacing Pulse Width: 0.4 ms
Lead Channel Setting Sensing Sensitivity: 2 mV

## 2020-06-17 NOTE — Progress Notes (Signed)
Remote pacemaker transmission.   

## 2020-06-19 ENCOUNTER — Ambulatory Visit: Payer: Medicare Other | Admitting: Physical Therapy

## 2020-06-19 ENCOUNTER — Encounter: Payer: Self-pay | Admitting: Physical Therapy

## 2020-06-19 ENCOUNTER — Other Ambulatory Visit: Payer: Self-pay

## 2020-06-19 DIAGNOSIS — M546 Pain in thoracic spine: Secondary | ICD-10-CM

## 2020-06-19 DIAGNOSIS — R29898 Other symptoms and signs involving the musculoskeletal system: Secondary | ICD-10-CM

## 2020-06-19 DIAGNOSIS — M62830 Muscle spasm of back: Secondary | ICD-10-CM

## 2020-06-19 DIAGNOSIS — R293 Abnormal posture: Secondary | ICD-10-CM | POA: Diagnosis not present

## 2020-06-19 NOTE — Therapy (Signed)
[facility] [address]  [place], [place], [address] Phone: [phone]   Fax:  [phone]  Physical Therapy Discharge Summary  Patient Details  Name: Martin Rubio MRN: [id number] Date of Birth: 09-14-1946 Referring Provider (PT): Clearance Coots, MD   Progress Note Reporting Period 05/09/20 to 06/19/20  See note below for Objective Data and Assessment of Progress/Goals.     Encounter Date: 06/19/2020   PT End of Session - 06/19/20 1010    Visit Number 10    Number of Visits 10    Date for PT Re-Evaluation 06/20/20    Authorization Type Medicare & [employer]    PT Start Time [phone]    PT Stop Time 1004    PT Time Calculation (min) 35 min    Activity Tolerance Patient tolerated treatment well    Behavior During Therapy WFL for tasks assessed/performed           Past Medical History:  Diagnosis Date  . Bronchitis   . Diverticulosis   . FH: BPH (benign prostatic hypertrophy)   . History of Graves' disease 01/19/2018   S/p ablation  . HTN (hypertension)   . Hyperlipidemia   . Obesity   . Psoriatic arthritis ([employer])   . Rheumatoid arthritis ([place]) 08/07/2014  . Sleep apnea    CPAP  . Thyroid disease     Past Surgical History:  Procedure Laterality Date  . INGUINAL HERNIA REPAIR     bilateral  . KNEE SURGERY     bilateral arthroscopic  . PACEMAKER IMPLANT N/A 12/13/2019   Procedure: PACEMAKER IMPLANT;  Surgeon: Evans Lance, MD;  Location: [facility];  Service: Cardiovascular;  Laterality: N/A;  . TRANSURETHRAL RESECTION OF PROSTATE    . UMBILICAL HERNIA REPAIR      There were no vitals filed for this visit.   Subjective Assessment - 06/19/20 0929    Subjective Followed up with his surgeon who advised him to return to aquatic therapy at the aquatic center. Reports considerable fluctuation in his pain levels, not sure if he has benefitted from therapy. Still having hip pain with walking  and LBP with bending over, but feels that the midback pain is "much better."    Pertinent History pacemaker, grave's disease, RA, psoriatic arthritis, HLD, HTN, B arthroscopic knee surgery    Diagnostic tests 04/25/20 lumbar xray: Mild degenerative disc disease is noted at L3-4. No acuteabnormality seen in the lumbar spine.; 04/25/20 thoracic xray: No acute abnormality seen in the thoracic spine. Multilevel degenerative disc disease is noted.    Patient Stated Goals get rid of pain    Currently in Pain? No/denies              [facility] PT Assessment - 06/19/20 0001      Observation/Other Assessments   Focus on Therapeutic Outcomes (FOTO)  unable- would not load      AROM   Thoracic Flexion WNL   pain in R hip   Thoracic Extension moderately limited   mild pain in LB   Thoracic - Right [facility]   "stretch"   Thoracic - Left [place] WFL   "stretch" and R hip pain   Thoracic - Right Rotation mildly limited    Thoracic - Left Rotation mildly limited   mild R midback pain  [facility] Adult PT Treatment/Exercise - 06/19/20 0001      Self-Care   Self-Care Lifting    Other Self-Care Comments  edu and practice lifting 10# and 20# box from floor to chest with cues to keep close to object and maintain spine neutral   mild R thoracic back pain which resolved quick     Lumbar Exercises: Aerobic   Nustep Lvl 4, 6 min (UE/LE)      Lumbar Exercises: Standing   Other Standing Lumbar Exercises standing thoracic extension at wall 10x3"   cues for form                 PT Education - 06/19/20 1010    Education Details discussion on objective progress and remaining impairments; update/consolidation of HEP    Person(s) Educated Patient    Methods Demonstration;Explanation;Tactile cues;Verbal cues;Handout    Comprehension Verbalized understanding;Returned demonstration            PT Short Term Goals - 06/19/20 0931      PT SHORT TERM GOAL #1    Title Pt will be independent with initial HEP.    Time 3    Period Weeks    Status Achieved    Target Date 05/30/20             PT Long Term Goals - 06/19/20 0931      PT LONG TERM GOAL #1   Title Pt will be independent with advanced HEP.    Time 6    Period Weeks    Status Achieved      PT LONG TERM GOAL #2   Title Pt will demonstrate proper body mechanics to lift 20lb box to avoid further injury.    Time 6    Period Weeks    Status Achieved   able to lift 20# with mild thoracic pain     PT LONG TERM GOAL #3   Title Patient to demonstrate thoracic AROM WFL and without pain limiting.    Time 6    Period Weeks    Status Partially Met   proved in flexion, extension, SB     PT LONG TERM GOAL #4   Title Patient to recall/demonstrate postural correction at rest and with activity to improve postural awareness.    Time 6    Period Weeks    Status Achieved   notes continued slouching while at the computer, but reports improved awareness     PT LONG TERM GOAL #5   Title Patient to report 75% improvement in pain.    Time 6    Period Weeks    Status Achieved   reports 90% improvement in thoracic pain levels                Plan - 06/19/20 1011    Clinical Impression Statement Patient reporting that he followed up with his surgeon who advised him to start aquatic therapy for his LBP. Patient notes that he still having hip pain with walking and LBP with bending over, but feels that the midback pain is "much better" and reporting 90% improvement in this region. Patient has met or partially met all goals at this time. Thoracic AROM has improved in B rotation, now with mild pain remaining but ROM WFL. Posture goal has been met, as patient does seem to demonstrate an improvement in upright sitting posture and reports improved awareness and ability to self-correct. Able to lift weighted box with able to lift weighted box with cueing  to maintain proper form today. Patient reported  understanding of HEP changes made today and without further complaints at end of session. Patient has demonstrated good progress towards set therapy goals for thoracic spine, with later onset of LBP and hip pain that will be addressed with aquatic therapy. Patient is ready for DC at this time.    Comorbidities pacemaker, grave's disease, RA, psoriatic arthritis, HLD, HTN, B arthroscopic knee surgery    PT Treatment/Interventions ADLs/Self Care Home Management;Cryotherapy;Moist Heat;Balance training;Therapeutic exercise;Therapeutic activities;Functional mobility training;Stair training;Gait training;Neuromuscular re-education;Patient/family education;Manual techniques;Taping;Energy conservation;Dry needling;Passive range of motion    PT Next Visit Plan DC at this time    Consulted and Agree with Plan of Care Patient           Patient will benefit from skilled therapeutic intervention in order to improve the following deficits and impairments:  Hypomobility, Decreased activity tolerance, Decreased strength, Increased fascial restricitons, Impaired UE functional use, Pain, Difficulty walking, Increased muscle spasms, Cardiopulmonary status limiting activity, Improper body mechanics, Decreased range of motion, Impaired flexibility, Postural dysfunction  Visit Diagnosis: Pain in thoracic spine  Muscle spasm of back  Abnormal posture  Other symptoms and signs involving the musculoskeletal system     Problem List Patient Active Problem List   Diagnosis Date Noted  . Acute right-sided low back pain without sciatica 04/25/2020  . Secondary hypercoagulable state ([place]) 03/05/2020  . Educated about COVID-19 virus infection 01/27/2020  . PCP notes >>>>>>>>>>>>>>> 01/10/2020  . Age related osteoporosis 01/10/2020  . CKD (chronic kidney disease), stage IV ([place]) 12/16/2019  . Pacemaker 12/16/2019  . Sick sinus syndrome ([place]) 12/16/2019  . Chest pain 12/15/2019  . Paroxysmal atrial fibrillation  ([place]) 10/26/2019  . Dyslipidemia 09/14/2019  . Nodule of flexor tendon sheath 04/13/2019  . Hemorrhoids 06/24/2018  . Low bone mass 01/19/2018  . Iliac aneurysm ([place]) 06/16/2017  . Hypertrophic cardiomyopathy ([place]) 06/16/2017  . Essential hypertension 12/16/2016  . Hyperlipidemia LDL goal <100 12/16/2016  . Asthma in adult, mild intermittent, uncomplicated 01/74/9449  . Collapsed vertebra, not elsewhere classified, cervical region, initial encounter for fracture ([place]) 10/08/2016  . Neuropathy 10/08/2016  . Contracture of ankle and foot joint 08/07/2014  . Dysphagia 08/07/2014  . Esophageal reflux 08/07/2014  . Erectile dysfunction 08/07/2014  . Obstructive sleep apnea 08/07/2014  . Osteoarthritis 08/07/2014  . Rheumatoid arthritis ([place]) 08/07/2014  . Sensorineural hearing loss 08/07/2014  . Sleep disorder 08/07/2014  . Tinnitus 08/07/2014  . LVH (left ventricular hypertrophy) 12/25/2012  . Diverticulosis 09/01/2011  . White coat hypertension 07/07/2011  . Hypothyroidism 06/09/2011  . Postablative hypothyroidism 06/09/2011  . Psoriatic arthritis ([place]) 06/09/2011  . Benign prostatic hyperplasia 06/09/2011     PHYSICAL THERAPY DISCHARGE SUMMARY  Visits from Start of Care: 10  Current functional level related to goals / functional outcomes: See above clinical impression   Remaining deficits: Painful and limited thoracic ROM   Education / Equipment: HEP  Plan: Patient agrees to discharge.  Patient goals were partially met. Patient is being discharged due to meeting the stated rehab goals.  ?????     Janene Harvey, PT, DPT 06/19/20 10:19 AM   [facility] [address]  [place], [place], [address] Phone: [phone]   Fax:  [phone]  Name: Martin Rubio MRN: [id number] Date of Birth: [birth year]

## 2020-06-28 DIAGNOSIS — M6281 Muscle weakness (generalized): Secondary | ICD-10-CM | POA: Diagnosis not present

## 2020-06-28 DIAGNOSIS — R6884 Jaw pain: Secondary | ICD-10-CM | POA: Diagnosis not present

## 2020-07-05 DIAGNOSIS — M4186 Other forms of scoliosis, lumbar region: Secondary | ICD-10-CM | POA: Diagnosis not present

## 2020-07-08 ENCOUNTER — Other Ambulatory Visit: Payer: Self-pay

## 2020-07-08 ENCOUNTER — Ambulatory Visit (HOSPITAL_COMMUNITY)
Admission: RE | Admit: 2020-07-08 | Discharge: 2020-07-08 | Disposition: A | Discharge status: Home / Self Care | Payer: Medicare Other | Source: Ambulatory Visit | Attending: Physician Assistant | Admitting: Physician Assistant

## 2020-07-08 VITALS — BP 130/68 | HR 65 | Ht 70.0 in | Wt 212.0 lb

## 2020-07-08 DIAGNOSIS — E0500 Thyrotoxicosis with diffuse goiter without thyrotoxic crisis or storm: Secondary | ICD-10-CM | POA: Diagnosis not present

## 2020-07-08 DIAGNOSIS — G4733 Obstructive sleep apnea (adult) (pediatric): Secondary | ICD-10-CM | POA: Insufficient documentation

## 2020-07-08 DIAGNOSIS — I10 Essential (primary) hypertension: Secondary | ICD-10-CM | POA: Insufficient documentation

## 2020-07-08 DIAGNOSIS — Z7901 Long term (current) use of anticoagulants: Secondary | ICD-10-CM | POA: Insufficient documentation

## 2020-07-08 DIAGNOSIS — I480 Paroxysmal atrial fibrillation: Secondary | ICD-10-CM

## 2020-07-08 DIAGNOSIS — I495 Sick sinus syndrome: Secondary | ICD-10-CM | POA: Insufficient documentation

## 2020-07-08 DIAGNOSIS — Z9989 Dependence on other enabling machines and devices: Secondary | ICD-10-CM | POA: Diagnosis not present

## 2020-07-08 DIAGNOSIS — Z79890 Hormone replacement therapy (postmenopausal): Secondary | ICD-10-CM | POA: Insufficient documentation

## 2020-07-08 DIAGNOSIS — E669 Obesity, unspecified: Secondary | ICD-10-CM | POA: Insufficient documentation

## 2020-07-08 DIAGNOSIS — Z950 Presence of cardiac pacemaker: Secondary | ICD-10-CM | POA: Insufficient documentation

## 2020-07-08 DIAGNOSIS — I4892 Unspecified atrial flutter: Secondary | ICD-10-CM | POA: Insufficient documentation

## 2020-07-08 DIAGNOSIS — Z6830 Body mass index (BMI) 30.0-30.9, adult: Secondary | ICD-10-CM | POA: Insufficient documentation

## 2020-07-08 DIAGNOSIS — D6869 Other thrombophilia: Secondary | ICD-10-CM | POA: Diagnosis not present

## 2020-07-08 DIAGNOSIS — Z8249 Family history of ischemic heart disease and other diseases of the circulatory system: Secondary | ICD-10-CM | POA: Diagnosis not present

## 2020-07-08 DIAGNOSIS — E785 Hyperlipidemia, unspecified: Secondary | ICD-10-CM | POA: Insufficient documentation

## 2020-07-08 DIAGNOSIS — Z79899 Other long term (current) drug therapy: Secondary | ICD-10-CM | POA: Diagnosis not present

## 2020-07-08 LAB — MAGNESIUM: Magnesium: 2.1 mg/dL (ref 1.7–2.4)

## 2020-07-08 LAB — BASIC METABOLIC PANEL
Anion gap: 10 (ref 5–15)
BUN: 16 mg/dL (ref 8–23)
CO2: 25 mmol/L (ref 22–32)
Calcium: 9.3 mg/dL (ref 8.9–10.3)
Chloride: 106 mmol/L (ref 98–111)
Creatinine, Ser: 1.44 mg/dL — ABNORMAL HIGH (ref 0.61–1.24)
GFR calc Af Amer: 55 mL/min — ABNORMAL LOW (ref >60)
GFR calc non Af Amer: 47 mL/min — ABNORMAL LOW (ref >60)
Glucose, Bld: 110 mg/dL — ABNORMAL HIGH (ref 70–99)
Potassium: 4.5 mmol/L (ref 3.5–5.1)
Sodium: 141 mmol/L (ref 135–145)

## 2020-07-08 NOTE — Progress Notes (Signed)
Primary Care Physician: Martin Branch, MD Primary Cardiologist: Dr Martin Rubio  Primary Electrophysiologist: Dr Martin Rubio Referring Physician: Dr Martin Rubio is a 74 y.o. male with a history of HTN, paroxysmal atrial fibrillation, atrial flutter, OSA, HLD, Grave's disease, sinus node dysfunction s/p PPM who presents for follow up in the [facility]. Patient is on Eliquis for a CHADS2VASC score of 3. Patient underwent PPM implant on 12/13/19 for his sinus node dysfunction. Patient has had episodes of afib lasting 2-3 hours with symptoms of fatigue and dyspnea with exertion. He was seen by Dr Martin Rubio on 01/29/20 who recommended dofetilide. Patient is s/p dofetilide loading 02/2020.  On follow up today, patient reports that he has done well since his last visit. He denies heart racing or palpitations. His device shows <0.1% afib burden. He denies bleeding issues on anticoagulation.   Today, he denies symptoms of palpitations, chest pain, orthopnea, PND, lower extremity edema, dizziness, presyncope, syncope, snoring, daytime somnolence, bleeding, or neurologic sequela. The patient is tolerating medications without difficulties and is otherwise without complaint today.    Atrial Fibrillation Risk Factors:  he does have symptoms or diagnosis of sleep apnea. he is compliant with CPAP therapy. he does not have a history of rheumatic fever. he does have a history of alcohol use.   he has a BMI of Body mass index is 30.42 kg/m.Marland Kitchen Filed Weights   07/08/20 1040  Weight: (!) 96.2 kg    Family History  Problem Relation Age of Onset  . CAD Father 63       Died age 45  . CAD Mother 70       CABG  . Diabetes Brother      Atrial Fibrillation Management history:  Previous antiarrhythmic drugs: dofetilide Previous cardioversions: none Previous ablations: none CHADS2VASC score: 3 Anticoagulation history: Eliquis   Past Medical History:  Diagnosis Date    . Bronchitis   . Diverticulosis   . FH: BPH (benign prostatic hypertrophy)   . History of Graves' disease 01/19/2018   S/p ablation  . HTN (hypertension)   . Hyperlipidemia   . Obesity   . Psoriatic arthritis ([place])   . Rheumatoid arthritis ([place]) 08/07/2014  . Sleep apnea    CPAP  . Thyroid disease    Past Surgical History:  Procedure Laterality Date  . INGUINAL HERNIA REPAIR     bilateral  . KNEE SURGERY     bilateral arthroscopic  . PACEMAKER IMPLANT N/A 12/13/2019   Procedure: PACEMAKER IMPLANT;  Surgeon: Martin Lance, MD;  Location: [facility];  Service: Cardiovascular;  Laterality: N/A;  . TRANSURETHRAL RESECTION OF PROSTATE    . UMBILICAL HERNIA REPAIR      Current Outpatient Medications  Medication Sig Dispense Refill  . acetaminophen (TYLENOL) 325 MG tablet Take 650 mg by mouth as needed for mild pain.     Marland Kitchen alendronate (FOSAMAX) 70 MG tablet Take 1 tablet (70 mg total) by mouth once a week. Take with a full glass of water on an empty stomach. 12 tablet 3  . Alirocumab (PRALUENT) 150 MG/ML SOAJ Inject 150 mg into the skin every 14 (fourteen) days. 6 pen 3  . apixaban (ELIQUIS) 5 MG TABS tablet Take 1 tablet (5 mg total) by mouth 2 (two) times daily. 180 tablet 3  . Ascorbic Acid (VITAMIN C ADULT GUMMIES PO) Take by mouth. 1 gummies daily    . baclofen (LIORESAL) 10 MG tablet Take  0.5 tablets (5 mg total) by mouth 2 (two) times daily as needed for muscle spasms. 30 each 1  . Chlorpheniramine Maleate (ALLERGY RELIEF PO) Take 1 tablet by mouth as needed.    . Cholecalciferol (VITAMIN D) 125 MCG (5000 UT) CAPS Take 5,000 Units by mouth daily.     . Cyanocobalamin (VITAMIN B 12) 500 MCG TABS Take 1,000 mcg by mouth daily.     Marland Kitchen dofetilide (TIKOSYN) 250 MCG capsule Take 1 capsule (250 mcg total) by mouth 2 (two) times daily. 60 capsule 6  . fluticasone (FLONASE) 50 MCG/ACT nasal spray Place 1 spray into both nostrils as needed for allergies.     Marland Kitchen gabapentin  (NEURONTIN) 100 MG capsule Take 1 capsule (100 mg total) by mouth 3 (three) times daily as needed. (Patient taking differently: Take 100 mg by mouth as needed. ) 60 capsule 1  . levothyroxine (SYNTHROID) 125 MCG tablet Take 1 tablet (125 mcg total) by mouth daily before breakfast. 90 tablet 1  . losartan (COZAAR) 50 MG tablet Take 50 mg by mouth 2 (two) times daily as needed (BLOOD PRESSURE OVER 150/90).    . Melatonin 5 MG TABS Take 5 mg by mouth at bedtime.    Marland Kitchen omeprazole (PRILOSEC) 20 MG capsule Take 20 mg by mouth daily. Buys the [employer] brand.    . sildenafil (REVATIO) 20 MG tablet Take 60 mg by mouth once a week.     . tamsulosin (FLOMAX) 0.4 MG CAPS capsule Take 0.4 mg by mouth at bedtime.     . triamcinolone cream (KENALOG) 0.1 % Apply 1 application topically as needed for itching.    Marland Kitchen Ustekinumab (STELARA [place]) Inject into the skin. Per Rheumatology -every 3 months    . zinc gluconate 50 MG tablet Take 50 mg by mouth at bedtime.     No current facility-administered medications for this encounter.    Allergies  Allergen Reactions  . Codeine     Tension, nasty feeling   . Nsaids Other (See Comments)    Renal insufficiency  . Prednisone Other (See Comments)    Unknown/ sometimes takes with lower dose  . Statins Other (See Comments)    Joint pain   . Sulfa Antibiotics Other (See Comments)    Joint pain    Social History   Socioeconomic History  . Marital status: Married    Spouse name: Not on file  . Number of children: 2  . Years of education: Not on file  . Highest education level: Not on file  Occupational History  . Occupation: Retired    Fish farm manager: [employer]  Tobacco Use  . Smoking status: Former Smoker    Types: Cigarettes  . Smokeless tobacco: Never Used  . Tobacco comment: Quit 30 years ago.  Vaping Use  . Vaping Use: Never used  Substance and Sexual Activity  . Alcohol use: No  . Drug use: No  . Sexual activity: Not on file  Other Topics Concern  .  Not on file  Social History Narrative   Lives with wife.   Social Determinants of Health   Financial Resource Strain:   . Difficulty of Paying Living Expenses:   Food Insecurity:   . Worried About Charity fundraiser in the Last Year:   . Arboriculturist in the Last Year:   Transportation Needs:   . Film/video editor (Medical):   Marland Kitchen Lack of Transportation (Non-Medical):   Physical Activity:   . Days  of Exercise per Week:   . Minutes of Exercise per Session:   Stress:   . Feeling of Stress :   Social Connections:   . Frequency of Communication with Friends and Family:   . Frequency of Social Gatherings with Friends and Family:   . Attends Religious Services:   . Active Member of Clubs or Organizations:   . Attends Archivist Meetings:   Marland Kitchen Marital Status:   Intimate Partner Violence:   . Fear of Current or Ex-Partner:   . Emotionally Abused:   Marland Kitchen Physically Abused:   . Sexually Abused:      ROS- All systems are reviewed and negative except as per the HPI above.  Physical Exam: Vitals:   07/08/20 1040  BP: (!) 130/68  Pulse: 65  Weight: (!) 96.2 kg  Height: 5\' 10"  (1.778 m)    GEN- The patient is well appearing obese elderly male, alert and oriented x 3 today.   HEENT-head normocephalic, atraumatic, sclera clear, conjunctiva pink, hearing intact, trachea midline. Lungs- Clear to ausculation bilaterally, normal work of breathing Heart- Regular rate and rhythm, no murmurs, rubs or gallops  GI- soft, NT, ND, + BS Extremities- no clubbing, cyanosis, or edema MS- no significant deformity or atrophy Skin- no rash or lesion Psych- euthymic mood, full affect Neuro- strength and sensation are intact   Wt Readings from Last 3 Encounters:  07/08/20 (!) 96.2 kg  05/31/20 96.2 kg  05/07/20 96.6 kg    EKG today demonstrates SR HR 65, 1st degree AV block, NST, PR 236, QRS 80, QTc 440  Echo 12/16/19 demonstrated  1. Left ventricular ejection fraction, by  visual estimation, is 55 to  60%. The left ventricle has normal function. There is no increased left  ventricular wall thickness.  2. Left ventricular diastolic parameters are consistent with Grade I  diastolic dysfunction (impaired relaxation).  3. The left ventricle has no regional wall motion abnormalities.  4. Global right ventricle has normal systolc function.The right  ventricular size is normal. no increase in right ventricular wall  thickness.  5. The mitral valve is normal in structure. No evidence of mitral valve  regurgitation. No evidence of mitral stenosis.  6. The tricuspid valve was normal in structure. Tricuspid valve  regurgitation is not demonstrated.  7. Tricuspid valve regurgitation is not demonstrated.  8. Mild aortic valve sclerosis without stenosis.  9. The pulmonic valve was normal in structure.  10. The inferior vena cava is normal in size with greater than 50%  respiratory variability, suggesting right atrial pressure of 3 mmHg.   Epic records are reviewed at length today  CHA2DS2-VASc Score = 3 The patient's score is based upon: CHF History: No HTN History: Yes Age : 37 + Diabetes History: No Stroke History: No Vascular Disease History: No Gender: Male   ASSESSMENT AND PLAN: 1. Paroxysmal Atrial Fibrillation/atrial flutter The patient's CHA2DS2-VASc score is 3, indicating a 3.2% annual risk of stroke.   S/p dofetilide loading 3/30-03/08/20. Afib burden <0.1% on device interrogation.  Continue dofetilide 250 mcg BID. QT stable. Check bmet/mag. Continue Eliquis 5 mg BID  2. Secondary Hypercoagulable State (ICD10:  D68.69) The patient is at significant risk for stroke/thromboembolism based upon his CHA2DS2-VASc Score of 3.  Continue Apixaban (Eliquis).   3. Obesity Body mass index is 30.42 kg/m. Lifestyle modification was discussed and encouraged including regular physical activity and weight reduction.  4. Obstructive sleep  apnea Patient reports compliance with CPAP therapy.  5. HTN  Stable, no changes today.  6. Sinus node dysfunction S/p PPM, followed by Dr Martin Rubio and the device clinic.   Follow up with Dr Martin Rubio and Dr Martin Rubio as scheduled. AF clinic in 6 months.    [facility] [address] [place], [place] [address] [phone] 07/08/2020 11:22 AM

## 2020-07-12 ENCOUNTER — Ambulatory Visit: Payer: Medicare Other | Admitting: Family Medicine

## 2020-07-15 ENCOUNTER — Telehealth: Payer: Self-pay

## 2020-07-15 NOTE — Telephone Encounter (Signed)
Carelink Alert ongoing AF w/ VR 100-140's.   Pt with known history of AF recently seen in AF clinic on 8/2.  Current meds include- Tikosyn  250 mcg BID and Eliquis 5 mg BID.    Attempted to reach pt to determine symptoms/ med management.  No answer, DPR on file.  Left detailed message on cell phone VM indicated would be forwarding info to AF clinic, provided device clinic # for callback if any questions

## 2020-07-15 NOTE — Telephone Encounter (Signed)
Called and left message for patient to call back to schedule appt this week.

## 2020-07-15 NOTE — Telephone Encounter (Signed)
Patient returned my call.  He states he is in [place] currently and will not be able to come in until later this week.  He is aware of appt 07/18/20 @2 :30.

## 2020-07-18 ENCOUNTER — Encounter (HOSPITAL_COMMUNITY): Payer: Self-pay | Admitting: Physician Assistant

## 2020-07-18 ENCOUNTER — Ambulatory Visit (HOSPITAL_COMMUNITY)
Admission: RE | Admit: 2020-07-18 | Discharge: 2020-07-18 | Disposition: A | Discharge status: Home / Self Care | Payer: Medicare Other | Source: Ambulatory Visit | Attending: Physician Assistant | Admitting: Physician Assistant

## 2020-07-18 ENCOUNTER — Other Ambulatory Visit: Payer: Self-pay

## 2020-07-18 VITALS — BP 142/82 | HR 60 | Ht 70.0 in | Wt 212.6 lb

## 2020-07-18 DIAGNOSIS — Z7951 Long term (current) use of inhaled steroids: Secondary | ICD-10-CM | POA: Insufficient documentation

## 2020-07-18 DIAGNOSIS — Z7952 Long term (current) use of systemic steroids: Secondary | ICD-10-CM | POA: Diagnosis not present

## 2020-07-18 DIAGNOSIS — E669 Obesity, unspecified: Secondary | ICD-10-CM | POA: Insufficient documentation

## 2020-07-18 DIAGNOSIS — E079 Disorder of thyroid, unspecified: Secondary | ICD-10-CM | POA: Insufficient documentation

## 2020-07-18 DIAGNOSIS — Z87891 Personal history of nicotine dependence: Secondary | ICD-10-CM | POA: Diagnosis not present

## 2020-07-18 DIAGNOSIS — M069 Rheumatoid arthritis, unspecified: Secondary | ICD-10-CM | POA: Diagnosis not present

## 2020-07-18 DIAGNOSIS — Z8249 Family history of ischemic heart disease and other diseases of the circulatory system: Secondary | ICD-10-CM | POA: Insufficient documentation

## 2020-07-18 DIAGNOSIS — I480 Paroxysmal atrial fibrillation: Secondary | ICD-10-CM

## 2020-07-18 DIAGNOSIS — Z79899 Other long term (current) drug therapy: Secondary | ICD-10-CM | POA: Diagnosis not present

## 2020-07-18 DIAGNOSIS — Z6830 Body mass index (BMI) 30.0-30.9, adult: Secondary | ICD-10-CM | POA: Insufficient documentation

## 2020-07-18 DIAGNOSIS — E785 Hyperlipidemia, unspecified: Secondary | ICD-10-CM | POA: Diagnosis not present

## 2020-07-18 DIAGNOSIS — D6869 Other thrombophilia: Secondary | ICD-10-CM

## 2020-07-18 DIAGNOSIS — L409 Psoriasis, unspecified: Secondary | ICD-10-CM | POA: Insufficient documentation

## 2020-07-18 DIAGNOSIS — Z950 Presence of cardiac pacemaker: Secondary | ICD-10-CM | POA: Diagnosis not present

## 2020-07-18 DIAGNOSIS — Z79890 Hormone replacement therapy (postmenopausal): Secondary | ICD-10-CM | POA: Insufficient documentation

## 2020-07-18 DIAGNOSIS — G4733 Obstructive sleep apnea (adult) (pediatric): Secondary | ICD-10-CM | POA: Diagnosis not present

## 2020-07-18 DIAGNOSIS — Z7901 Long term (current) use of anticoagulants: Secondary | ICD-10-CM | POA: Diagnosis not present

## 2020-07-18 DIAGNOSIS — I10 Essential (primary) hypertension: Secondary | ICD-10-CM | POA: Diagnosis not present

## 2020-07-18 DIAGNOSIS — Z9989 Dependence on other enabling machines and devices: Secondary | ICD-10-CM | POA: Insufficient documentation

## 2020-07-18 DIAGNOSIS — I495 Sick sinus syndrome: Secondary | ICD-10-CM | POA: Diagnosis not present

## 2020-07-18 NOTE — Progress Notes (Signed)
Primary Care Physician: Colon Branch, MD Primary Cardiologist: Dr [name]  Primary Electrophysiologist: Dr [name] Referring Physician: Dr [name] is a 74 y.o. male with a history of HTN, paroxysmal atrial fibrillation, atrial flutter, OSA, HLD, Grave's disease, sinus node dysfunction s/p PPM who presents for follow up in the [facility]. Patient is on Eliquis for a CHADS2VASC score of 3. Patient underwent PPM implant on 12/13/19 for his sinus node dysfunction. Patient has had episodes of afib lasting 2-3 hours with symptoms of fatigue and dyspnea with exertion. He was seen by Dr [name] on 01/29/20 who recommended dofetilide. Patient is s/p dofetilide loading 02/2020.  On follow up today, patient has had increased episodes of afib per device clinic. Patient did have periods of fatigue and some dizziness which seem to correlate with his afib. He was on vacation in [place] during his longest episode. He denies alcohol use. He does report that he had increased his dose of prednisone for his psoriasis but the onset of his afib predated this. He is back in SR today.  Today, he denies symptoms of palpitations, chest pain, orthopnea, PND, lower extremity edema, presyncope, syncope, snoring, daytime somnolence, bleeding, or neurologic sequela. The patient is tolerating medications without difficulties and is otherwise without complaint today.    Atrial Fibrillation Risk Factors:  he does have symptoms or diagnosis of sleep apnea. he is compliant with CPAP therapy. he does not have a history of rheumatic fever. he does have a history of alcohol use.   he has a BMI of Body mass index is 30.5 kg/m.Marland Kitchen Filed Weights   07/18/20 1437  Weight: 96.4 kg    Family History  Problem Relation Age of Onset  . CAD Father 39       Died age 30  . CAD Mother 35       CABG  . Diabetes Brother      Atrial Fibrillation Management history:  Previous antiarrhythmic  drugs: dofetilide Previous cardioversions: none Previous ablations: none CHADS2VASC score: 3 Anticoagulation history: Eliquis   Past Medical History:  Diagnosis Date  . Bronchitis   . Diverticulosis   . FH: BPH (benign prostatic hypertrophy)   . History of Graves' disease 01/19/2018   S/p ablation  . HTN (hypertension)   . Hyperlipidemia   . Obesity   . Psoriatic arthritis ([place])   . Rheumatoid arthritis ([place]) 08/07/2014  . Sleep apnea    CPAP  . Thyroid disease    Past Surgical History:  Procedure Laterality Date  . INGUINAL HERNIA REPAIR     bilateral  . KNEE SURGERY     bilateral arthroscopic  . PACEMAKER IMPLANT N/A 12/13/2019   Procedure: PACEMAKER IMPLANT;  Surgeon: Evans Lance, MD;  Location: [facility];  Service: Cardiovascular;  Laterality: N/A;  . TRANSURETHRAL RESECTION OF PROSTATE    . UMBILICAL HERNIA REPAIR      Current Outpatient Medications  Medication Sig Dispense Refill  . acetaminophen (TYLENOL) 325 MG tablet Take 650 mg by mouth as needed for mild pain.     Marland Kitchen alendronate (FOSAMAX) 70 MG tablet Take 1 tablet (70 mg total) by mouth once a week. Take with a full glass of water on an empty stomach. 12 tablet 3  . Alirocumab (PRALUENT) 150 MG/ML SOAJ Inject 150 mg into the skin every 14 (fourteen) days. 6 pen 3  . apixaban (ELIQUIS) 5 MG TABS tablet Take 1 tablet (5 mg  total) by mouth 2 (two) times daily. 180 tablet 3  . Ascorbic Acid (VITAMIN C ADULT GUMMIES PO) Take by mouth. 1 gummies daily    . baclofen (LIORESAL) 10 MG tablet Take 0.5 tablets (5 mg total) by mouth 2 (two) times daily as needed for muscle spasms. 30 each 1  . Chlorpheniramine Maleate (ALLERGY RELIEF PO) Take 1 tablet by mouth as needed.    . Cholecalciferol (VITAMIN D) 125 MCG (5000 UT) CAPS Take 5,000 Units by mouth daily.     . Cyanocobalamin (VITAMIN B 12) 500 MCG TABS Take 1,000 mcg by mouth daily.     Marland Kitchen dofetilide (TIKOSYN) 250 MCG capsule Take 1 capsule (250 mcg total) by  mouth 2 (two) times daily. 60 capsule 6  . fluticasone (FLONASE) 50 MCG/ACT nasal spray Place 1 spray into both nostrils as needed for allergies.     Marland Kitchen levothyroxine (SYNTHROID) 125 MCG tablet Take 1 tablet (125 mcg total) by mouth daily before breakfast. 90 tablet 1  . losartan (COZAAR) 50 MG tablet Take 50 mg by mouth 2 (two) times daily as needed (BLOOD PRESSURE OVER 150/90).    . Melatonin 5 MG TABS Take 5 mg by mouth at bedtime.    Marland Kitchen omeprazole (PRILOSEC) 20 MG capsule Take 20 mg by mouth daily. Buys the [employer] brand.    . predniSONE (DELTASONE) 5 MG tablet Take 5-10 mg by mouth daily.    . sildenafil (REVATIO) 20 MG tablet Take 60 mg by mouth once a week.     . tamsulosin (FLOMAX) 0.4 MG CAPS capsule Take 0.4 mg by mouth at bedtime.     . triamcinolone cream (KENALOG) 0.1 % Apply 1 application topically as needed for itching.    Marland Kitchen Ustekinumab (STELARA [place]) Inject into the skin. Per Rheumatology -every 3 months    . zinc gluconate 50 MG tablet Take 50 mg by mouth at bedtime.     No current facility-administered medications for this encounter.    Allergies  Allergen Reactions  . Codeine     Tension, nasty feeling   . Nsaids Other (See Comments)    Renal insufficiency  . Prednisone Other (See Comments)    Unknown/ sometimes takes with lower dose  . Statins Other (See Comments)    Joint pain   . Sulfa Antibiotics Other (See Comments)    Joint pain    Social History   Socioeconomic History  . Marital status: Married    Spouse name: Not on file  . Number of children: 2  . Years of education: Not on file  . Highest education level: Not on file  Occupational History  . Occupation: Retired    Fish farm manager: [employer]  Tobacco Use  . Smoking status: Former Smoker    Types: Cigarettes  . Smokeless tobacco: Never Used  . Tobacco comment: Quit 30 years ago.  Vaping Use  . Vaping Use: Never used  Substance and Sexual Activity  . Alcohol use: No  . Drug use: No  . Sexual  activity: Not on file  Other Topics Concern  . Not on file  Social History Narrative   Lives with wife.   Social Determinants of Health   Financial Resource Strain:   . Difficulty of Paying Living Expenses:   Food Insecurity:   . Worried About Charity fundraiser in the Last Year:   . Arboriculturist in the Last Year:   Transportation Needs:   . Film/video editor (Medical):   Marland Kitchen  Lack of Transportation (Non-Medical):   Physical Activity:   . Days of Exercise per Week:   . Minutes of Exercise per Session:   Stress:   . Feeling of Stress :   Social Connections:   . Frequency of Communication with Friends and Family:   . Frequency of Social Gatherings with Friends and Family:   . Attends Religious Services:   . Active Member of Clubs or Organizations:   . Attends Archivist Meetings:   Marland Kitchen Marital Status:   Intimate Partner Violence:   . Fear of Current or Ex-Partner:   . Emotionally Abused:   Marland Kitchen Physically Abused:   . Sexually Abused:      ROS- All systems are reviewed and negative except as per the HPI above.  Physical Exam: Vitals:   07/18/20 1437  BP: (!) 142/82  Pulse: 60  Weight: 96.4 kg  Height: 5\' 10"  (1.778 m)    GEN- The patient is well appearing obese elderly male, alert and oriented x 3 today.   HEENT-head normocephalic, atraumatic, sclera clear, conjunctiva pink, hearing intact, trachea midline. Lungs- Clear to ausculation bilaterally, normal work of breathing Heart- Regular rate and rhythm, no murmurs, rubs or gallops  GI- soft, NT, ND, + BS Extremities- no clubbing, cyanosis, or edema MS- no significant deformity or atrophy Skin- no rash or lesion Psych- euthymic mood, full affect Neuro- strength and sensation are intact   Wt Readings from Last 3 Encounters:  07/18/20 96.4 kg  07/08/20 (!) 96.2 kg  05/31/20 96.2 kg    EKG today demonstrates A paced rhythm HR 60, PR 242, QRS 94, QTc 456  Echo 12/16/19 demonstrated  1. Left  ventricular ejection fraction, by visual estimation, is 55 to  60%. The left ventricle has normal function. There is no increased left  ventricular wall thickness.  2. Left ventricular diastolic parameters are consistent with Grade I  diastolic dysfunction (impaired relaxation).  3. The left ventricle has no regional wall motion abnormalities.  4. Global right ventricle has normal systolc function.The right  ventricular size is normal. no increase in right ventricular wall  thickness.  5. The mitral valve is normal in structure. No evidence of mitral valve  regurgitation. No evidence of mitral stenosis.  6. The tricuspid valve was normal in structure. Tricuspid valve  regurgitation is not demonstrated.  7. Tricuspid valve regurgitation is not demonstrated.  8. Mild aortic valve sclerosis without stenosis.  9. The pulmonic valve was normal in structure.  10. The inferior vena cava is normal in size with greater than 50%  respiratory variability, suggesting right atrial pressure of 3 mmHg.   Epic records are reviewed at length today  CHA2DS2-VASc Score = 3 The patient's score is based upon: CHF History: No HTN History: Yes Age : 65 + Diabetes History: No Stroke History: No Vascular Disease History: No Gender: Male   ASSESSMENT AND PLAN: 1. Paroxysmal Atrial Fibrillation/atrial flutter The patient's CHA2DS2-VASc score is 3, indicating a 3.2% annual risk of stroke.   S/p dofetilide loading 3/30-03/08/20. Patient had increased burden of afib. ? Related to recent travel. Will continue present therapy for now and track his afib burden. He is back in SR. Continue dofetilide 250 mcg BID. QT stable. Continue Eliquis 5 mg BID  2. Secondary Hypercoagulable State (ICD10:  D68.69) The patient is at significant risk for stroke/thromboembolism based upon his CHA2DS2-VASc Score of 3.  Continue Apixaban (Eliquis).   3. Obesity Body mass index is 30.5 kg/m. Lifestyle modification  was discussed and encouraged including regular physical activity and weight reduction.  4. Obstructive sleep apnea Patient reports compliance with CPAP therapy.  5. HTN Stable, no changes today.  6. Sinus node dysfunction S/p PPM, followed by Dr [name] and the device clinic.   Follow up with Dr [name], Dr [name], and AF clinic as scheduled. Sooner in AF clinic if afib again becomes persistent.    [facility] [address] [place], [place] [address] [phone] 07/18/2020 4:54 PM

## 2020-07-22 DIAGNOSIS — M4180 Other forms of scoliosis, site unspecified: Secondary | ICD-10-CM | POA: Diagnosis not present

## 2020-07-22 DIAGNOSIS — M6281 Muscle weakness (generalized): Secondary | ICD-10-CM | POA: Diagnosis not present

## 2020-07-24 DIAGNOSIS — M4180 Other forms of scoliosis, site unspecified: Secondary | ICD-10-CM | POA: Diagnosis not present

## 2020-07-24 DIAGNOSIS — M6281 Muscle weakness (generalized): Secondary | ICD-10-CM | POA: Diagnosis not present

## 2020-07-26 DIAGNOSIS — L409 Psoriasis, unspecified: Secondary | ICD-10-CM | POA: Diagnosis not present

## 2020-07-26 DIAGNOSIS — M545 Low back pain: Secondary | ICD-10-CM | POA: Diagnosis not present

## 2020-07-26 DIAGNOSIS — M79671 Pain in right foot: Secondary | ICD-10-CM | POA: Diagnosis not present

## 2020-07-26 DIAGNOSIS — M150 Primary generalized (osteo)arthritis: Secondary | ICD-10-CM | POA: Diagnosis not present

## 2020-07-26 DIAGNOSIS — M5136 Other intervertebral disc degeneration, lumbar region: Secondary | ICD-10-CM | POA: Diagnosis not present

## 2020-07-26 DIAGNOSIS — L4050 Arthropathic psoriasis, unspecified: Secondary | ICD-10-CM | POA: Diagnosis not present

## 2020-07-26 DIAGNOSIS — Z6830 Body mass index (BMI) 30.0-30.9, adult: Secondary | ICD-10-CM | POA: Diagnosis not present

## 2020-07-26 DIAGNOSIS — E669 Obesity, unspecified: Secondary | ICD-10-CM | POA: Diagnosis not present

## 2020-07-26 DIAGNOSIS — L299 Pruritus, unspecified: Secondary | ICD-10-CM | POA: Diagnosis not present

## 2020-07-29 DIAGNOSIS — M6281 Muscle weakness (generalized): Secondary | ICD-10-CM | POA: Diagnosis not present

## 2020-07-29 DIAGNOSIS — M4180 Other forms of scoliosis, site unspecified: Secondary | ICD-10-CM | POA: Diagnosis not present

## 2020-07-31 DIAGNOSIS — M4180 Other forms of scoliosis, site unspecified: Secondary | ICD-10-CM | POA: Diagnosis not present

## 2020-07-31 DIAGNOSIS — M6281 Muscle weakness (generalized): Secondary | ICD-10-CM | POA: Diagnosis not present

## 2020-08-05 DIAGNOSIS — M6281 Muscle weakness (generalized): Secondary | ICD-10-CM | POA: Diagnosis not present

## 2020-08-05 DIAGNOSIS — M4180 Other forms of scoliosis, site unspecified: Secondary | ICD-10-CM | POA: Diagnosis not present

## 2020-08-07 DIAGNOSIS — G4733 Obstructive sleep apnea (adult) (pediatric): Secondary | ICD-10-CM | POA: Diagnosis not present

## 2020-08-07 DIAGNOSIS — M4180 Other forms of scoliosis, site unspecified: Secondary | ICD-10-CM | POA: Diagnosis not present

## 2020-08-07 DIAGNOSIS — M6281 Muscle weakness (generalized): Secondary | ICD-10-CM | POA: Diagnosis not present

## 2020-08-07 DIAGNOSIS — R0602 Shortness of breath: Secondary | ICD-10-CM | POA: Diagnosis not present

## 2020-08-07 DIAGNOSIS — R42 Dizziness and giddiness: Secondary | ICD-10-CM | POA: Diagnosis not present

## 2020-08-13 DIAGNOSIS — M6281 Muscle weakness (generalized): Secondary | ICD-10-CM | POA: Diagnosis not present

## 2020-08-13 DIAGNOSIS — M4180 Other forms of scoliosis, site unspecified: Secondary | ICD-10-CM | POA: Diagnosis not present

## 2020-08-14 DIAGNOSIS — R0602 Shortness of breath: Secondary | ICD-10-CM | POA: Diagnosis not present

## 2020-08-15 DIAGNOSIS — L503 Dermatographic urticaria: Secondary | ICD-10-CM | POA: Diagnosis not present

## 2020-08-15 DIAGNOSIS — L821 Other seborrheic keratosis: Secondary | ICD-10-CM | POA: Diagnosis not present

## 2020-08-15 DIAGNOSIS — Z85828 Personal history of other malignant neoplasm of skin: Secondary | ICD-10-CM | POA: Diagnosis not present

## 2020-08-15 DIAGNOSIS — D225 Melanocytic nevi of trunk: Secondary | ICD-10-CM | POA: Diagnosis not present

## 2020-08-15 DIAGNOSIS — D692 Other nonthrombocytopenic purpura: Secondary | ICD-10-CM | POA: Diagnosis not present

## 2020-08-15 DIAGNOSIS — D1801 Hemangioma of skin and subcutaneous tissue: Secondary | ICD-10-CM | POA: Diagnosis not present

## 2020-08-16 DIAGNOSIS — M6281 Muscle weakness (generalized): Secondary | ICD-10-CM | POA: Diagnosis not present

## 2020-08-16 DIAGNOSIS — M4180 Other forms of scoliosis, site unspecified: Secondary | ICD-10-CM | POA: Diagnosis not present

## 2020-08-29 DIAGNOSIS — L4050 Arthropathic psoriasis, unspecified: Secondary | ICD-10-CM | POA: Diagnosis not present

## 2020-09-02 ENCOUNTER — Ambulatory Visit: Payer: Medicare Other | Admitting: Internal Medicine

## 2020-09-02 DIAGNOSIS — Z23 Encounter for immunization: Secondary | ICD-10-CM | POA: Diagnosis not present

## 2020-09-04 ENCOUNTER — Other Ambulatory Visit: Payer: Self-pay | Admitting: Cardiology

## 2020-09-04 DIAGNOSIS — H02403 Unspecified ptosis of bilateral eyelids: Secondary | ICD-10-CM | POA: Diagnosis not present

## 2020-09-04 DIAGNOSIS — H04123 Dry eye syndrome of bilateral lacrimal glands: Secondary | ICD-10-CM | POA: Diagnosis not present

## 2020-09-04 DIAGNOSIS — I4891 Unspecified atrial fibrillation: Secondary | ICD-10-CM

## 2020-09-04 DIAGNOSIS — H2513 Age-related nuclear cataract, bilateral: Secondary | ICD-10-CM | POA: Diagnosis not present

## 2020-09-05 ENCOUNTER — Other Ambulatory Visit: Payer: Self-pay | Admitting: Internal Medicine

## 2020-09-05 DIAGNOSIS — E890 Postprocedural hypothyroidism: Secondary | ICD-10-CM

## 2020-09-09 ENCOUNTER — Other Ambulatory Visit: Payer: Self-pay

## 2020-09-09 ENCOUNTER — Ambulatory Visit (INDEPENDENT_AMBULATORY_CARE_PROVIDER_SITE_OTHER): Payer: Medicare Other | Admitting: Internal Medicine

## 2020-09-09 ENCOUNTER — Encounter: Payer: Self-pay | Admitting: Internal Medicine

## 2020-09-09 VITALS — BP 142/80 | HR 65 | Temp 97.8°F | Resp 16 | Ht 70.0 in | Wt 215.0 lb

## 2020-09-09 DIAGNOSIS — Z289 Immunization not carried out for unspecified reason: Secondary | ICD-10-CM | POA: Diagnosis not present

## 2020-09-09 DIAGNOSIS — I10 Essential (primary) hypertension: Secondary | ICD-10-CM | POA: Diagnosis not present

## 2020-09-09 DIAGNOSIS — E039 Hypothyroidism, unspecified: Secondary | ICD-10-CM | POA: Diagnosis not present

## 2020-09-09 DIAGNOSIS — R0609 Other forms of dyspnea: Secondary | ICD-10-CM

## 2020-09-09 DIAGNOSIS — R0600 Dyspnea, unspecified: Secondary | ICD-10-CM | POA: Diagnosis not present

## 2020-09-09 NOTE — Patient Instructions (Addendum)
   GO TO THE FRONT DESK, PLEASE SCHEDULE YOUR APPOINTMENTS Come back for  A check up in 3 months

## 2020-09-09 NOTE — Progress Notes (Signed)
Pre visit review using our clinic review tool, if applicable. No additional management support is needed unless otherwise documented below in the visit note. 

## 2020-09-09 NOTE — Progress Notes (Signed)
Subjective:    Patient ID: Martin Rubio, male    DOB: [birth year], 74 y.o.   MRN: [id number]  DOS:  09/09/2020 Type of visit - description: Follow-up   Multiple issues discussed today. His main concern continues to be DOE, already evaluated by cardiology and pulmonology.  Review of Systems See above   Past Medical History:  Diagnosis Date  . Bronchitis   . Diverticulosis   . FH: BPH (benign prostatic hypertrophy)   . History of Graves' disease 01/19/2018   S/p ablation  . HTN (hypertension)   . Hyperlipidemia   . Obesity   . Psoriatic arthritis ([place])   . Rheumatoid arthritis ([place]) 08/07/2014  . Sleep apnea    CPAP  . Thyroid disease     Past Surgical History:  Procedure Laterality Date  . INGUINAL HERNIA REPAIR     bilateral  . KNEE SURGERY     bilateral arthroscopic  . PACEMAKER IMPLANT N/A 12/13/2019   Procedure: PACEMAKER IMPLANT;  Surgeon: Evans Lance, MD;  Location: [facility];  Service: Cardiovascular;  Laterality: N/A;  . TRANSURETHRAL RESECTION OF PROSTATE    . UMBILICAL HERNIA REPAIR      Allergies as of 09/09/2020      Reactions   Codeine    Tension, nasty feeling   Nsaids Other (See Comments)   Renal insufficiency   Prednisone Other (See Comments)   Unknown/ sometimes takes with lower dose   Statins Other (See Comments)   Joint pain   Sulfa Antibiotics Other (See Comments)   Joint pain      Medication List       Accurate as of September 09, 2020 11:59 PM. If you have any questions, ask your nurse or doctor.        STOP taking these medications   baclofen 10 MG tablet Commonly known as: LIORESAL Stopped by: Kathlene November, MD   predniSONE 5 MG tablet Commonly known as: DELTASONE Stopped by: Kathlene November, MD     TAKE these medications   acetaminophen 325 MG tablet Commonly known as: TYLENOL Take 650 mg by mouth as needed for mild pain.   alendronate 70 MG tablet Commonly known as: FOSAMAX Take 1 tablet (70 mg total) by mouth once a  week. Take with a full glass of water on an empty stomach.   ALLERGY RELIEF PO Take 1 tablet by mouth as needed.   dofetilide 250 MCG capsule Commonly known as: TIKOSYN Take 1 capsule (250 mcg total) by mouth 2 (two) times daily.   Eliquis 5 MG Tabs tablet Generic drug: apixaban TAKE 1 TABLET BY MOUTH TWICE A DAY   fluticasone 50 MCG/ACT nasal spray Commonly known as: FLONASE Place 1 spray into both nostrils as needed for allergies.   levothyroxine 125 MCG tablet Commonly known as: SYNTHROID Take 1 tablet (125 mcg total) by mouth daily before breakfast.   losartan 50 MG tablet Commonly known as: COZAAR Take 50 mg by mouth 2 (two) times daily as needed (BLOOD PRESSURE OVER 150/90).   melatonin 5 MG Tabs Take 5 mg by mouth at bedtime.   omeprazole 20 MG capsule Commonly known as: PRILOSEC Take 20 mg by mouth daily. Buys the [employer] brand.   Praluent 150 MG/ML [place] Generic drug: Alirocumab Inject 150 mg into the skin every 14 (fourteen) days.   sildenafil 20 MG tablet Commonly known as: REVATIO Take 60 mg by mouth once a week.   STELARA [place] Inject into the skin.  Per Rheumatology -every 3 months   tamsulosin 0.4 MG Caps capsule Commonly known as: FLOMAX Take 0.4 mg by mouth at bedtime.   triamcinolone cream 0.1 % Commonly known as: KENALOG Apply 1 application topically as needed for itching.   Vitamin B 12 500 MCG Tabs Take 1,000 mcg by mouth daily.   VITAMIN C ADULT GUMMIES PO Take by mouth. 1 gummies daily   Vitamin D 125 MCG (5000 UT) Caps Take 5,000 Units by mouth daily.   zinc gluconate 50 MG tablet Take 50 mg by mouth at bedtime.          Objective:   Physical Exam BP (!) 142/80 (BP Location: Left Arm, Patient Position: Sitting, Cuff Size: Normal)   Pulse 65   Temp 97.8 F (36.6 C) (Oral)   Resp 16   Ht 5\' 10"  (1.778 m)   Wt 215 lb (97.5 kg)   SpO2 94%   BMI 30.85 kg/m  General:   Well developed, NAD, BMI noted. HEENT:  Normocephalic .  Face symmetric, atraumatic Lungs:  CTA B Normal respiratory effort, no intercostal retractions, no accessory muscle use. Heart: RRR,  no murmur.  Lower extremities: no pretibial edema bilaterally  Skin: Not pale. Not jaundice Neurologic:  alert & oriented X3.  Speech normal, gait appropriate for age and unassisted Psych--  Cognition and judgment appear intact.  Cooperative with normal attention span and concentration.  Behavior appropriate. No anxious or depressed appearing.      Assessment     ASSESSMENT  (transfer to me 12/2019) HTN High cholesterol, seen at the lipid clinic Hypothyroidism  Psoriatic Arthritis [name]  Osteoporosis: on fosamax rx by PCP, start holiday by mid 2022 (see visit note from 02/29/2020) CKD mild,stable . Creatinine ~1.5 CV: -Paroxysmal A. Fib, dx 09/2019 anticoagulated -Sick sinus syndrome, s/p pacemaker (12/13/2019) -Septal hypertrophy per echo -Left common iliac aneurysm, S/U cardiology OSA on CPAP BPH [name]  PLAN HTN: Currently on losartan "prn" per cards;  BP was elevated today, took 2 losartan's, BP is better today. Already d/w the cardiology RN his BPs from today . Hypothyroidism: Last TSH satisfactory.  No change CKD: Last BMP satisfactory DOE: Seen by pulmonology 08/07/2020, day reviewed the CPAP download, order a chest x-ray (nonacute) and PFTs.  They recommended to continue see cardiology. Preventive care: The majority of today's visit was spent discussing Covid Vaccinations, he has a number of misconceptions about it ("they use baby parts to do the vaccine, Dr. Brenton Grills is a crook") etc. Advised patient to reach out to websites such as [employer], [place] etc. in order to get reliable information. He knows  he is high risk, in fact I told him I do not think he would survive a Covid infection .  At the end he said he would consider the [place] vaccine.   RTC 3 months  Time is spent today 32 minutes, more than  50% of this time talking about vaccination hesitancy  This visit occurred during the SARS-CoV-2 public health emergency.  Safety protocols were in place, including screening questions prior to the visit, additional usage of staff PPE, and extensive cleaning of exam room while observing appropriate contact time as indicated for disinfecting solutions.

## 2020-09-10 NOTE — Assessment & Plan Note (Signed)
HTN: Currently on losartan "prn" per cards;  BP was elevated today, took 2 losartan's, BP is better today. Already d/w the cardiology RN his BPs from today . Hypothyroidism: Last TSH satisfactory.  No change CKD: Last BMP satisfactory DOE: Seen by pulmonology 08/07/2020, day reviewed the CPAP download, order a chest x-ray (nonacute) and PFTs.  They recommended to continue see cardiology. Preventive care: The majority of today's visit was spent discussing Covid Vaccinations, he has a number of misconceptions about it ("they use baby parts to do the vaccine, Dr. Brenton Grills is a crook") etc. Advised patient to reach out to websites such as [employer], [place] etc. in order to get reliable information. He knows  he is high risk, in fact I told him I do not think he would survive a Covid infection .  At the end he said he would consider the [place] vaccine.   RTC 3 months

## 2020-09-11 ENCOUNTER — Telehealth: Payer: Self-pay | Admitting: Cardiology

## 2020-09-11 DIAGNOSIS — Z23 Encounter for immunization: Secondary | ICD-10-CM | POA: Diagnosis not present

## 2020-09-11 NOTE — Telephone Encounter (Signed)
*  STAT* If patient is at the pharmacy, call can be transferred to refill team.   1. Which medications need to be refilled? (please list name of each medication and dose if known) losartan (COZAAR) 50 MG tablet  2. Which pharmacy/location (including street and city if local pharmacy) is medication to be sent to? [id number] - [place], [place] - [address] AT [address]OSS FROM [address]  3. Do they need a 30 day or 90 day supply? 90 day

## 2020-09-12 ENCOUNTER — Ambulatory Visit (INDEPENDENT_AMBULATORY_CARE_PROVIDER_SITE_OTHER): Payer: Medicare Other

## 2020-09-12 DIAGNOSIS — I495 Sick sinus syndrome: Secondary | ICD-10-CM | POA: Diagnosis not present

## 2020-09-12 LAB — CUP PACEART REMOTE DEVICE CHECK
Battery Remaining Longevity: 140 mo
Battery Voltage: 3.09 V
Brady Statistic AP VP Percent: 8.84 %
Brady Statistic AP VS Percent: 45.76 %
Brady Statistic AS VP Percent: 0.27 %
Brady Statistic AS VS Percent: 45.13 %
Brady Statistic RA Percent Paced: 54.22 %
Brady Statistic RV Percent Paced: 9.15 %
Date Time Interrogation Session: 20211006233602
Implantable Lead Implant Date: 20210106
Implantable Lead Implant Date: 20210106
Implantable Lead Location: 753859
Implantable Lead Location: 753860
Implantable Lead Model: 3830
Implantable Lead Model: 5076
Implantable Pulse Generator Implant Date: 20210106
Lead Channel Impedance Value: 304 Ohm
Lead Channel Impedance Value: 361 Ohm
Lead Channel Impedance Value: 418 Ohm
Lead Channel Impedance Value: 532 Ohm
Lead Channel Pacing Threshold Amplitude: 0.5 V
Lead Channel Pacing Threshold Amplitude: 0.625 V
Lead Channel Pacing Threshold Pulse Width: 0.4 ms
Lead Channel Pacing Threshold Pulse Width: 0.4 ms
Lead Channel Sensing Intrinsic Amplitude: 1.625 mV
Lead Channel Sensing Intrinsic Amplitude: 1.625 mV
Lead Channel Sensing Intrinsic Amplitude: 28.875 mV
Lead Channel Sensing Intrinsic Amplitude: 28.875 mV
Lead Channel Setting Pacing Amplitude: 1.5 V
Lead Channel Setting Pacing Amplitude: 2.5 V
Lead Channel Setting Pacing Pulse Width: 0.4 ms
Lead Channel Setting Sensing Sensitivity: 2 mV

## 2020-09-12 MED ORDER — LOSARTAN POTASSIUM 50 MG PO TABS: 50.0000 mg | ORAL_TABLET | Freq: Two times a day (BID) | ORAL | 3 refills | Status: DC | PRN

## 2020-09-12 NOTE — Telephone Encounter (Signed)
Called patient left message on personal voice mail Losartan refill sent to your pharmacy.

## 2020-09-12 NOTE — Telephone Encounter (Signed)
Patient calling back for his refill.

## 2020-09-16 NOTE — Progress Notes (Signed)
Remote pacemaker transmission.   

## 2020-10-02 ENCOUNTER — Other Ambulatory Visit: Payer: Self-pay | Admitting: Physician Assistant

## 2020-10-04 ENCOUNTER — Ambulatory Visit (HOSPITAL_COMMUNITY)
Admission: RE | Admit: 2020-10-04 | Discharge: 2020-10-04 | Disposition: A | Discharge status: Home / Self Care | Payer: Medicare Other | Source: Ambulatory Visit | Attending: Internal Medicine | Admitting: Internal Medicine

## 2020-10-04 ENCOUNTER — Other Ambulatory Visit: Payer: Self-pay

## 2020-10-04 ENCOUNTER — Other Ambulatory Visit (HOSPITAL_COMMUNITY): Payer: Self-pay | Admitting: Cardiology

## 2020-10-04 ENCOUNTER — Other Ambulatory Visit: Payer: Self-pay | Admitting: Internal Medicine

## 2020-10-04 DIAGNOSIS — I723 Aneurysm of iliac artery: Secondary | ICD-10-CM | POA: Diagnosis not present

## 2020-10-04 DIAGNOSIS — E890 Postprocedural hypothyroidism: Secondary | ICD-10-CM

## 2020-10-09 DIAGNOSIS — H2511 Age-related nuclear cataract, right eye: Secondary | ICD-10-CM | POA: Diagnosis not present

## 2020-10-09 DIAGNOSIS — H2512 Age-related nuclear cataract, left eye: Secondary | ICD-10-CM | POA: Diagnosis not present

## 2020-10-09 DIAGNOSIS — H52221 Regular astigmatism, right eye: Secondary | ICD-10-CM | POA: Diagnosis not present

## 2020-10-09 DIAGNOSIS — H52222 Regular astigmatism, left eye: Secondary | ICD-10-CM | POA: Diagnosis not present

## 2020-10-09 DIAGNOSIS — Z01818 Encounter for other preprocedural examination: Secondary | ICD-10-CM | POA: Diagnosis not present

## 2020-10-14 DIAGNOSIS — H52222 Regular astigmatism, left eye: Secondary | ICD-10-CM | POA: Diagnosis not present

## 2020-10-14 DIAGNOSIS — H2512 Age-related nuclear cataract, left eye: Secondary | ICD-10-CM | POA: Diagnosis not present

## 2020-10-15 ENCOUNTER — Encounter: Payer: Medicare Other | Admitting: Internal Medicine

## 2020-10-18 ENCOUNTER — Other Ambulatory Visit: Payer: Self-pay

## 2020-10-18 ENCOUNTER — Encounter: Payer: Self-pay | Admitting: Student

## 2020-10-18 ENCOUNTER — Ambulatory Visit (INDEPENDENT_AMBULATORY_CARE_PROVIDER_SITE_OTHER): Payer: Medicare Other | Admitting: Student

## 2020-10-18 VITALS — BP 124/70 | HR 75 | Ht 70.0 in | Wt 217.0 lb

## 2020-10-18 DIAGNOSIS — I480 Paroxysmal atrial fibrillation: Secondary | ICD-10-CM

## 2020-10-18 DIAGNOSIS — Z950 Presence of cardiac pacemaker: Secondary | ICD-10-CM | POA: Diagnosis not present

## 2020-10-18 DIAGNOSIS — I495 Sick sinus syndrome: Secondary | ICD-10-CM

## 2020-10-18 LAB — CUP PACEART INCLINIC DEVICE CHECK
Battery Remaining Longevity: 146 mo
Battery Voltage: 3.08 V
Brady Statistic AP VP Percent: 8.79 %
Brady Statistic AP VS Percent: 48.33 %
Brady Statistic AS VP Percent: 0.3 %
Brady Statistic AS VS Percent: 42.58 %
Brady Statistic RA Percent Paced: 56.54 %
Brady Statistic RV Percent Paced: 9.28 %
Date Time Interrogation Session: 20211112124816
Implantable Lead Implant Date: 20210106
Implantable Lead Implant Date: 20210106
Implantable Lead Location: 753859
Implantable Lead Location: 753860
Implantable Lead Model: 3830
Implantable Lead Model: 5076
Implantable Pulse Generator Implant Date: 20210106
Lead Channel Impedance Value: 342 Ohm
Lead Channel Impedance Value: 380 Ohm
Lead Channel Impedance Value: 380 Ohm
Lead Channel Impedance Value: 551 Ohm
Lead Channel Pacing Threshold Amplitude: 0.5 V
Lead Channel Pacing Threshold Amplitude: 0.5 V
Lead Channel Pacing Threshold Pulse Width: 0.4 ms
Lead Channel Pacing Threshold Pulse Width: 0.4 ms
Lead Channel Sensing Intrinsic Amplitude: 2 mV
Lead Channel Sensing Intrinsic Amplitude: 2.5 mV
Lead Channel Sensing Intrinsic Amplitude: 31.625 mV
Lead Channel Sensing Intrinsic Amplitude: 31.625 mV
Lead Channel Setting Pacing Amplitude: 1.5 V
Lead Channel Setting Pacing Amplitude: 2.5 V
Lead Channel Setting Pacing Pulse Width: 0.4 ms
Lead Channel Setting Sensing Sensitivity: 2 mV

## 2020-10-18 NOTE — Progress Notes (Signed)
Electrophysiology Office Note Date: 10/18/2020  ID:  Donterius, Filley 1946-02-21, MRN [id number]  PCP: Colon Branch, MD Primary Cardiologist: Minus Breeding, MD Electrophysiologist: Cristopher Peru, MD   CC: Pacemaker follow-up  Martin Rubio is a 74 y.o. male seen today for Cristopher Peru, MD for routine electrophysiology followup.  Since last being seen in our clinic the patient reports doing very well.  he denies chest pain, palpitations, dyspnea, PND, orthopnea, nausea, vomiting, dizziness, syncope, edema, weight gain, or early satiety.  Device History: MDT dual chamber PPM, implanted 12/13/19  Past Medical History:  Diagnosis Date  . Bronchitis   . Diverticulosis   . FH: BPH (benign prostatic hypertrophy)   . History of Graves' disease 01/19/2018   S/p ablation  . HTN (hypertension)   . Hyperlipidemia   . Obesity   . Psoriatic arthritis ([place])   . Rheumatoid arthritis ([place]) 08/07/2014  . Sleep apnea    CPAP  . Thyroid disease    Past Surgical History:  Procedure Laterality Date  . INGUINAL HERNIA REPAIR     bilateral  . KNEE SURGERY     bilateral arthroscopic  . PACEMAKER IMPLANT N/A 12/13/2019   Procedure: PACEMAKER IMPLANT;  Surgeon: Evans Lance, MD;  Location: [facility];  Service: Cardiovascular;  Laterality: N/A;  . TRANSURETHRAL RESECTION OF PROSTATE    . UMBILICAL HERNIA REPAIR      Current Outpatient Medications  Medication Sig Dispense Refill  . acetaminophen (TYLENOL) 325 MG tablet Take 650 mg by mouth as needed for mild pain.     Marland Kitchen alendronate (FOSAMAX) 70 MG tablet Take 1 tablet (70 mg total) by mouth once a week. Take with a full glass of water on an empty stomach. 12 tablet 3  . Alirocumab (PRALUENT) 150 MG/ML SOAJ Inject 150 mg into the skin every 14 (fourteen) days. 6 pen 3  . Ascorbic Acid (VITAMIN C ADULT GUMMIES PO) Take by mouth. 1 gummies daily    . Chlorpheniramine Maleate (ALLERGY RELIEF PO) Take 1 tablet by mouth as needed.    .  Cholecalciferol (VITAMIN D) 125 MCG (5000 UT) CAPS Take 5,000 Units by mouth daily.     . Cyanocobalamin (VITAMIN B 12) 500 MCG TABS Take 1,000 mcg by mouth daily.     Marland Kitchen dofetilide (TIKOSYN) 250 MCG capsule TAKE ONE CAPSULE BY MOUTH TWICE A DAY 180 capsule 2  . ELIQUIS 5 MG TABS tablet TAKE 1 TABLET BY MOUTH TWICE A DAY 180 tablet 3  . etanercept (ENBREL) 50 MG/ML injection Inject 50 mg into the skin once a week.    . fluticasone (FLONASE) 50 MCG/ACT nasal spray Place 1 spray into both nostrils as needed for allergies.     Marland Kitchen levothyroxine (SYNTHROID) 125 MCG tablet Take 1 tablet (125 mcg total) by mouth daily before breakfast. 90 tablet 0  . losartan (COZAAR) 50 MG tablet Take 1 tablet (50 mg total) by mouth 2 (two) times daily as needed (BLOOD PRESSURE OVER 150/90). 180 tablet 3  . Melatonin 5 MG TABS Take 5 mg by mouth at bedtime.    Marland Kitchen omeprazole (PRILOSEC) 20 MG capsule Take 20 mg by mouth daily. Buys the [employer] brand.    . predniSONE (DELTASONE) 5 MG tablet Take by mouth as needed.    . sildenafil (REVATIO) 20 MG tablet Take 60 mg by mouth once a week.     . tamsulosin (FLOMAX) 0.4 MG CAPS capsule Take 0.4 mg by mouth  at bedtime.     . triamcinolone cream (KENALOG) 0.1 % Apply 1 application topically as needed for itching.     . zinc gluconate 50 MG tablet Take 50 mg by mouth at bedtime.     No current facility-administered medications for this visit.    Allergies:   Codeine, Nsaids, Prednisone, Statins, and Sulfa antibiotics   Social History: Social History   Socioeconomic History  . Marital status: Married    Spouse name: Not on file  . Number of children: 2  . Years of education: Not on file  . Highest education level: Not on file  Occupational History  . Occupation: Retired    Fish farm manager: [employer]  Tobacco Use  . Smoking status: Former Smoker    Types: Cigarettes  . Smokeless tobacco: Never Used  . Tobacco comment: Quit 30 years ago.  Vaping Use  . Vaping Use: Never  used  Substance and Sexual Activity  . Alcohol use: No  . Drug use: No  . Sexual activity: Not on file  Other Topics Concern  . Not on file  Social History Narrative   Lives with wife.   Social Determinants of Health   Financial Resource Strain:   . Difficulty of Paying Living Expenses: Not on file  Food Insecurity:   . Worried About Charity fundraiser in the Last Year: Not on file  . Ran Out of Food in the Last Year: Not on file  Transportation Needs:   . Lack of Transportation (Medical): Not on file  . Lack of Transportation (Non-Medical): Not on file  Physical Activity:   . Days of Exercise per Week: Not on file  . Minutes of Exercise per Session: Not on file  Stress:   . Feeling of Stress : Not on file  Social Connections:   . Frequency of Communication with Friends and Family: Not on file  . Frequency of Social Gatherings with Friends and Family: Not on file  . Attends Religious Services: Not on file  . Active Member of Clubs or Organizations: Not on file  . Attends Archivist Meetings: Not on file  . Marital Status: Not on file  Intimate Partner Violence:   . Fear of Current or Ex-Partner: Not on file  . Emotionally Abused: Not on file  . Physically Abused: Not on file  . Sexually Abused: Not on file    Family History: Family History  Problem Relation Age of Onset  . CAD Father 52       Died age 21  . CAD Mother 72       CABG  . Diabetes Brother      Review of Systems: All other systems reviewed and are otherwise negative except as noted above.  Physical Exam: Vitals:   10/18/20 1112  BP: 124/70  Pulse: 75  SpO2: 95%  Weight: 217 lb (98.4 kg)  Height: 5\' 10"  (1.778 m)     GEN- The patient is well appearing, alert and oriented x 3 today.   HEENT: normocephalic, atraumatic; sclera clear, conjunctiva pink; hearing intact; oropharynx clear; neck supple  Lungs- Clear to ausculation bilaterally, normal work of breathing.  No wheezes, rales,  rhonchi Heart- Regular rate and rhythm, no murmurs, rubs or gallops  GI- soft, non-tender, non-distended, bowel sounds present  Extremities- no clubbing or cyanosis. No edema MS- no significant deformity or atrophy Skin- warm and dry, no rash or lesion; PPM pocket well healed Psych- euthymic mood, full affect Neuro- strength  and sensation are intact  PPM Interrogation- reviewed in detail today,  See PACEART report  EKG:  EKG is not ordered today. Stable QTc 07/18/2020  Recent Labs: 04/19/2020: Hemoglobin 13.3; Platelets 229; TSH 1.670 07/08/2020: BUN 16; Creatinine, Ser 1.44; Magnesium 2.1; Potassium 4.5; Sodium 141   Wt Readings from Last 3 Encounters:  10/18/20 217 lb (98.4 kg)  09/09/20 215 lb (97.5 kg)  07/18/20 212 lb 9.6 oz (96.4 kg)     Other studies Reviewed: Additional studies/ records that were reviewed today include: Previous EP office notes, Previous remote checks, Most recent labwork.   Assessment and Plan:  1. Paroxysmal Afib     CHA2DS2Vasc is 3 (4 given diastolic dysfunction), on Eliquis. Denies bleeding     Tikosyn,. Stable QTc AF burden 0.7% on Tikosyn      Labs/lytes today  2. PPM     Normal function. See paceart. No programming changes made     He did have mild awareness of V pacing at 90.       3. Diastolic dysfunction/CHF 4. DOE   Stable. No evidence of ischemic or infarct on stress test 05/07/2020.  5. HTN Stable on current regimen.    6. NSVT 1 episode Asymptomatic.   Current medicines are reviewed at length with the patient today.   The patient does not have concerns regarding his medicines.  The following changes were made today:  none  Labs/ tests ordered today include:  Orders Placed This Encounter  Procedures  . Basic Metabolic Panel (BMET)  . CBC w/Diff  . Magnesium   Disposition:   Follow up with Dr. [name] in 6 Months   Signed, Annamaria Helling  10/18/2020 12:52 PM  [place] [address] [phone] (office) [phone] (fax)

## 2020-10-18 NOTE — Patient Instructions (Signed)
Medication Instructions:  *If you need a refill on your cardiac medications before your next appointment, please call your pharmacy*  Lab Work: Your physician has recommended that you have lab work today: BMET, CBC, and Magnesium Level If you have labs (blood work) drawn today and your tests are completely normal, you will receive your results only by: Marland Kitchen MyChart Message (if you have MyChart) OR . A paper copy in the mail If you have any lab test that is abnormal or we need to change your treatment, we will call you to review the results.  Follow-Up: At [facility], you and your health needs are our priority.  As part of our continuing mission to provide you with exceptional heart care, we have created designated Provider Care Teams.  These Care Teams include your primary Cardiologist (physician) and Advanced Practice Providers (APPs -  Physician Assistants and Nurse Practitioners) who all work together to provide you with the care you need, when you need it.  We recommend signing up for the patient portal called "MyChart".  Sign up information is provided on this After Visit Summary.  MyChart is used to connect with patients for Virtual Visits (Telemedicine).  Patients are able to view lab/test results, encounter notes, upcoming appointments, etc.  Non-urgent messages can be sent to your provider as well.   To learn more about what you can do with MyChart, go to [email or web address].    Your next appointment:   Your physician recommends that you schedule a follow-up appointment in: 6 MONTHS with Dr. [name] or an EP APP  The format for your next appointment:   In Person -- You may see  Dr. [name] or one of the following Advanced Practice Providers on your designated Care Team:    Chanetta Marshall, NP  Tommye Standard, PA-C  Legrand Como "[place]" [place], [place]

## 2020-10-19 LAB — CBC WITH DIFFERENTIAL/PLATELET
Basophils Absolute: 0.1 10*3/uL (ref 0.0–0.2)
Basos: 1 %
EOS (ABSOLUTE): 0 10*3/uL (ref 0.0–0.4)
Eos: 0 %
Hematocrit: 40.2 % (ref 37.5–51.0)
Hemoglobin: 12.2 g/dL — ABNORMAL LOW (ref 13.0–17.7)
Immature Grans (Abs): 0.1 10*3/uL (ref 0.0–0.1)
Immature Granulocytes: 1 %
Lymphocytes Absolute: 0.8 10*3/uL (ref 0.7–3.1)
Lymphs: 10 %
MCH: 21.5 pg — ABNORMAL LOW (ref 26.6–33.0)
MCHC: 30.3 g/dL — ABNORMAL LOW (ref 31.5–35.7)
MCV: 71 fL — ABNORMAL LOW (ref 79–97)
Monocytes Absolute: 0.6 10*3/uL (ref 0.1–0.9)
Monocytes: 6 %
Neutrophils Absolute: 7.3 10*3/uL — ABNORMAL HIGH (ref 1.4–7.0)
Neutrophils: 82 %
Platelets: 190 10*3/uL (ref 150–450)
RBC: 5.67 x10E6/uL (ref 4.14–5.80)
RDW: 18 % — ABNORMAL HIGH (ref 11.6–15.4)
WBC: 8.8 10*3/uL (ref 3.4–10.8)

## 2020-10-19 LAB — BASIC METABOLIC PANEL
BUN/Creatinine Ratio: 15 (ref 10–24)
BUN: 21 mg/dL (ref 8–27)
CO2: 21 mmol/L (ref 20–29)
Calcium: 9.7 mg/dL (ref 8.6–10.2)
Chloride: 106 mmol/L (ref 96–106)
Creatinine, Ser: 1.41 mg/dL — ABNORMAL HIGH (ref 0.76–1.27)
GFR calc Af Amer: 56 mL/min/{1.73_m2} — ABNORMAL LOW (ref >59)
GFR calc non Af Amer: 48 mL/min/{1.73_m2} — ABNORMAL LOW (ref >59)
Glucose: 122 mg/dL — ABNORMAL HIGH (ref 65–99)
Potassium: 4.7 mmol/L (ref 3.5–5.2)
Sodium: 142 mmol/L (ref 134–144)

## 2020-10-19 LAB — MAGNESIUM: Magnesium: 2 mg/dL (ref 1.6–2.3)

## 2020-10-22 ENCOUNTER — Other Ambulatory Visit: Payer: Self-pay

## 2020-10-22 ENCOUNTER — Encounter: Payer: Self-pay | Admitting: Cardiovascular Disease

## 2020-10-22 ENCOUNTER — Ambulatory Visit (INDEPENDENT_AMBULATORY_CARE_PROVIDER_SITE_OTHER): Payer: Medicare Other | Admitting: Cardiovascular Disease

## 2020-10-22 VITALS — BP 143/81 | HR 60 | Temp 97.7°F | Ht 70.0 in | Wt 217.4 lb

## 2020-10-22 DIAGNOSIS — I480 Paroxysmal atrial fibrillation: Secondary | ICD-10-CM

## 2020-10-22 DIAGNOSIS — R0600 Dyspnea, unspecified: Secondary | ICD-10-CM | POA: Diagnosis not present

## 2020-10-22 DIAGNOSIS — I739 Peripheral vascular disease, unspecified: Secondary | ICD-10-CM | POA: Diagnosis not present

## 2020-10-22 DIAGNOSIS — E785 Hyperlipidemia, unspecified: Secondary | ICD-10-CM | POA: Diagnosis not present

## 2020-10-22 DIAGNOSIS — R0609 Other forms of dyspnea: Secondary | ICD-10-CM

## 2020-10-22 DIAGNOSIS — I10 Essential (primary) hypertension: Secondary | ICD-10-CM | POA: Diagnosis not present

## 2020-10-22 NOTE — Patient Instructions (Addendum)
Medication Instructions:  No changes *If you need a refill on your cardiac medications before your next appointment, please call your pharmacy*   Lab Work: None ordered If you have labs (blood work) drawn today and your tests are completely normal, you will receive your results only by:  [place] (if you have MyChart) OR  A paper copy in the mail If you have any lab test that is abnormal or we need to change your treatment, we will call you to review the results.   Testing/Procedures: Your physician has requested that you have a lower extremity ankle brachial index (ABI). During this test an ultrasound and blood pressure cuff are used to evaluate the arteries that supply the legs with blood. Allow thirty minutes for this exam. There are no restrictions or special instructions.   Follow-Up: At [facility], you and your health needs are our priority.  As part of our continuing mission to provide you with exceptional heart care, we have created designated Provider Care Teams.  These Care Teams include your primary Cardiologist (physician) and Advanced Practice Providers (APPs -  Physician Assistants and Nurse Practitioners) who all work together to provide you with the care you need, when you need it.  We recommend signing up for the patient portal called "MyChart".  Sign up information is provided on this After Visit Summary.  MyChart is used to connect with patients for Virtual Visits (Telemedicine).  Patients are able to view lab/test results, encounter notes, upcoming appointments, etc.  Non-urgent messages can be sent to your provider as well.   To learn more about what you can do with MyChart, go to [email or web address].    Your next appointment:   6 month(s)  The format for your next appointment:   In Person  Provider:   Kathlyn Sacramento, MD   Other Instructions None

## 2020-10-22 NOTE — Progress Notes (Signed)
Cardiology Office Note   Date:  10/22/2020   ID:  Martin Rubio, DOB 15-Oct-1946, MRN [id number]  PCP:  Colon Branch, MD  Cardiologist: Dr. [name]  No chief complaint on file.     History of Present Illness: Martin Rubio is a 74 y.o. male who was referred by Dr. [name] for evaluation and management of peripheral arterial disease. He has known history of essential hypertension, paroxysmal atrial fibrillation/flutter, bradycardia status post pacemaker placement, psoriatic arthritis on Enbrel, carotid disease and hyperlipidemia.  He has history of remote smoking but quit more than 45 years ago.  He is not diabetic. The patient has been followed for iliac aneurysms.  He has known history of left iliac aneurysm for many years since at least 2016.  At that time, the size was 1.7 cm in diameter.  He had recent duplex which showed no evidence of aortic aneurysm.  There was evidence of iliac aneurysms and stenosis.  Left common iliac artery measured 2.3 cm with peak velocity of 449.  Right common iliac artery measured 1.8 cm.  He reports cramping in both legs more on the left side that is most noticeable in the morning when he wakes up.  Usually it gets better with walking.  He has chronic back pain with no significant change.  He has bone spurs in his feet that limits his activities.  In addition, he seems to be mostly limited by exertional dyspnea happening with minimal exertion.  He denies chest pain.  He had a [employer] in June that was unremarkable.  Past Medical History:  Diagnosis Date  . Bronchitis   . Diverticulosis   . FH: BPH (benign prostatic hypertrophy)   . History of Graves' disease 01/19/2018   S/p ablation  . HTN (hypertension)   . Hyperlipidemia   . Obesity   . Psoriatic arthritis ([place])   . Rheumatoid arthritis ([place]) 08/07/2014  . Sleep apnea    CPAP  . Thyroid disease     Past Surgical History:  Procedure Laterality Date  . INGUINAL HERNIA REPAIR      bilateral  . KNEE SURGERY     bilateral arthroscopic  . PACEMAKER IMPLANT N/A 12/13/2019   Procedure: PACEMAKER IMPLANT;  Surgeon: Evans Lance, MD;  Location: [facility];  Service: Cardiovascular;  Laterality: N/A;  . TRANSURETHRAL RESECTION OF PROSTATE    . UMBILICAL HERNIA REPAIR       Current Outpatient Medications  Medication Sig Dispense Refill  . acetaminophen (TYLENOL) 325 MG tablet Take 650 mg by mouth as needed for mild pain.     Marland Kitchen alendronate (FOSAMAX) 70 MG tablet Take 1 tablet (70 mg total) by mouth once a week. Take with a full glass of water on an empty stomach. 12 tablet 3  . Alirocumab (PRALUENT) 150 MG/ML SOAJ Inject 150 mg into the skin every 14 (fourteen) days. 6 pen 3  . Ascorbic Acid (VITAMIN C ADULT GUMMIES PO) Take by mouth. 1 gummies daily    . Chlorpheniramine Maleate (ALLERGY RELIEF PO) Take 1 tablet by mouth as needed.    . Cholecalciferol (VITAMIN D) 125 MCG (5000 UT) CAPS Take 5,000 Units by mouth daily.     . Cyanocobalamin (VITAMIN B 12) 500 MCG TABS Take 1,000 mcg by mouth daily.     Marland Kitchen dofetilide (TIKOSYN) 250 MCG capsule TAKE ONE CAPSULE BY MOUTH TWICE A DAY 180 capsule 2  . ELIQUIS 5 MG TABS tablet TAKE 1 TABLET  BY MOUTH TWICE A DAY 180 tablet 3  . etanercept (ENBREL) 50 MG/ML injection Inject 50 mg into the skin once a week.    . fluticasone (FLONASE) 50 MCG/ACT nasal spray Place 1 spray into both nostrils as needed for allergies.     Marland Kitchen levothyroxine (SYNTHROID) 125 MCG tablet Take 1 tablet (125 mcg total) by mouth daily before breakfast. 90 tablet 0  . losartan (COZAAR) 50 MG tablet Take 1 tablet (50 mg total) by mouth 2 (two) times daily as needed (BLOOD PRESSURE OVER 150/90). 180 tablet 3  . Melatonin 5 MG TABS Take 5 mg by mouth at bedtime.    Marland Kitchen omeprazole (PRILOSEC) 20 MG capsule Take 20 mg by mouth daily. Buys the [employer] brand.    . predniSONE (DELTASONE) 5 MG tablet Take by mouth as needed.    . sildenafil (REVATIO) 20 MG tablet Take 60  mg by mouth once a week.     . tamsulosin (FLOMAX) 0.4 MG CAPS capsule Take 0.4 mg by mouth at bedtime.     . triamcinolone cream (KENALOG) 0.1 % Apply 1 application topically as needed for itching.     . zinc gluconate 50 MG tablet Take 50 mg by mouth at bedtime.     No current facility-administered medications for this visit.    Allergies:   Codeine, Nsaids, Prednisone, Statins, and Sulfa antibiotics    Social History:  The patient  reports that he has quit smoking. His smoking use included cigarettes. He has never used smokeless tobacco. He reports that he does not drink alcohol and does not use drugs.   Family History:  The patient's family history includes CAD (age of onset: 28) in his mother; CAD (age of onset: 66) in his father; Diabetes in his brother.    ROS:  Please see the history of present illness.   Otherwise, review of systems are positive for none.   All other systems are reviewed and negative.    PHYSICAL EXAM: VS:  BP (!) 143/81   Pulse 60   Temp 97.7 F (36.5 C)   Ht 5\' 10"  (1.778 m)   Wt 217 lb 6.4 oz (98.6 kg)   SpO2 96%   BMI 31.19 kg/m  , BMI Body mass index is 31.19 kg/m. GEN: Well nourished, well developed, in no acute distress  HEENT: normal  Neck: no JVD, carotid bruits, or masses Cardiac: RRR; no murmurs, rubs, or gallops,no edema  Respiratory:  clear to auscultation bilaterally, normal work of breathing GI: soft, nontender, nondistended, + BS MS: no deformity or atrophy  Skin: warm and dry, no rash Neuro:  Strength and sensation are intact Psych: euthymic mood, full affect Vascular: Femoral pulses normal on the right and slightly diminished on the left.   EKG:  EKG is not ordered today.    Recent Labs: 04/19/2020: TSH 1.670 10/18/2020: BUN 21; Creatinine, Ser 1.41; Hemoglobin 12.2; Magnesium 2.0; Platelets 190; Potassium 4.7; Sodium 142    Lipid Panel    Component Value Date/Time   CHOL 142 01/30/2020 1018   TRIG 174 (H) 01/30/2020  1018   HDL 40 01/30/2020 1018   CHOLHDL 3.6 01/30/2020 1018   LDLCALC 72 01/30/2020 1018      Wt Readings from Last 3 Encounters:  10/22/20 217 lb 6.4 oz (98.6 kg)  10/18/20 217 lb (98.4 kg)  09/09/20 215 lb (97.5 kg)        No flowsheet data found.    ASSESSMENT AND PLAN:  1.  Peripheral arterial disease with bilateral common iliac artery aneurysm: Likely asymptomatic as he has known history of chronic back pain with no significant change from before.  In addition, there has been no evidence of thromboembolism related to the aneurysms. he does have some iliac disease which might be affecting blood flow distally.  However, his complaint of left leg cramping does not seem to be consistent with claudication as it gets better with walking.  I am going to check his ABI bilaterally. In terms of asymptomatic iliac artery aneurysm, the threshold for repair is usually above 3 cm for growth more than 1 cm/year.  We can follow this in a year.  2.  Severe exertional dyspnea: The patient seems to be mostly limited by exertional dyspnea he seems to be frustrated.  He had an echocardiogram and a Lexiscan Myoview this year and both were unremarkable.  He also reports that he had pulmonary work-up that was unremarkable.  I wonder if the next step is to proceed with a right and left cardiac catheterization.  I am going to discuss this with Dr. [name].  3.  Paroxysmal atrial fibrillation on dofetilide: Maintaining sinus rhythm.  He is on long-term anticoagulation with Eliquis.  4.  Essential hypertension: Blood pressure is reasonably controlled on current medication.  5.  Hyperlipidemia: Currently Praluent.    Disposition:   FU with me in 6 months  Signed,  Kathlyn Sacramento, MD  10/22/2020 5:12 PM    [place]

## 2020-10-23 ENCOUNTER — Other Ambulatory Visit (HOSPITAL_COMMUNITY): Payer: Self-pay | Admitting: Cardiovascular Disease

## 2020-10-23 DIAGNOSIS — I739 Peripheral vascular disease, unspecified: Secondary | ICD-10-CM

## 2020-10-28 DIAGNOSIS — M545 Low back pain, unspecified: Secondary | ICD-10-CM | POA: Diagnosis not present

## 2020-10-28 DIAGNOSIS — Z6831 Body mass index (BMI) 31.0-31.9, adult: Secondary | ICD-10-CM | POA: Diagnosis not present

## 2020-10-28 DIAGNOSIS — E669 Obesity, unspecified: Secondary | ICD-10-CM | POA: Diagnosis not present

## 2020-10-28 DIAGNOSIS — Z7185 Encounter for immunization safety counseling: Secondary | ICD-10-CM | POA: Diagnosis not present

## 2020-10-28 DIAGNOSIS — L409 Psoriasis, unspecified: Secondary | ICD-10-CM | POA: Diagnosis not present

## 2020-10-28 DIAGNOSIS — L4050 Arthropathic psoriasis, unspecified: Secondary | ICD-10-CM | POA: Diagnosis not present

## 2020-10-28 DIAGNOSIS — M79671 Pain in right foot: Secondary | ICD-10-CM | POA: Diagnosis not present

## 2020-10-28 DIAGNOSIS — M5136 Other intervertebral disc degeneration, lumbar region: Secondary | ICD-10-CM | POA: Diagnosis not present

## 2020-10-28 DIAGNOSIS — M150 Primary generalized (osteo)arthritis: Secondary | ICD-10-CM | POA: Diagnosis not present

## 2020-11-04 DIAGNOSIS — H2511 Age-related nuclear cataract, right eye: Secondary | ICD-10-CM | POA: Diagnosis not present

## 2020-11-06 ENCOUNTER — Ambulatory Visit (HOSPITAL_COMMUNITY)
Admission: RE | Admit: 2020-11-06 | Discharge: 2020-11-06 | Disposition: A | Discharge status: Home / Self Care | Payer: Medicare Other | Source: Ambulatory Visit | Attending: Cardiology | Admitting: Cardiology

## 2020-11-06 ENCOUNTER — Other Ambulatory Visit: Payer: Self-pay

## 2020-11-06 DIAGNOSIS — I739 Peripheral vascular disease, unspecified: Secondary | ICD-10-CM | POA: Diagnosis not present

## 2020-11-10 NOTE — Progress Notes (Signed)
Cardiology Office Note   Date:  11/11/2020   ID:  Martin Rubio, DOB 1946/03/17, MRN [id number]  PCP:  Colon Branch, MD  Cardiologist:   Minus Breeding, MD   Chief Complaint  Patient presents with  . Shortness of Breath      History of Present Illness: Martin Rubio is a 74 y.o. male who presents for follow up of HTN.  His last echo in Nov of 2016 demonstrated no septal hypertrophy.  He has had atrial fib/flutter paroxysmally with slow ventricular rates.  He had pacemaker placement.   He continued to have PAF and was started on Tikosyn.   He has a very low burden as below.   At my last visit with him he had dyspnea but a negative Lexiscan Myoview.   Since I last saw him he has seen a pulmonologist.  He has been no clear etiology to his dyspnea.  He did have pulmonary function testing.  I looked at these results on Care Everywhere.  He denies any chest discomfort, neck or arm discomfort.  He has had no palpitations, presyncope or syncope.  He does get short of breath with mild activities.  He is not describing PND or orthopnea.  His weight is up a little bit.  He has had no new lower extremity swelling.   Past Medical History:  Diagnosis Date  . Bronchitis   . Diverticulosis   . FH: BPH (benign prostatic hypertrophy)   . History of Graves' disease 01/19/2018   S/p ablation  . HTN (hypertension)   . Hyperlipidemia   . Obesity   . Psoriatic arthritis ([place])   . Rheumatoid arthritis ([place]) 08/07/2014  . Sleep apnea    CPAP  . Thyroid disease     Past Surgical History:  Procedure Laterality Date  . INGUINAL HERNIA REPAIR     bilateral  . KNEE SURGERY     bilateral arthroscopic  . PACEMAKER IMPLANT N/A 12/13/2019   Procedure: PACEMAKER IMPLANT;  Surgeon: Evans Lance, MD;  Location: [facility];  Service: Cardiovascular;  Laterality: N/A;  . TRANSURETHRAL RESECTION OF PROSTATE    . UMBILICAL HERNIA REPAIR       Current Outpatient Medications  Medication Sig  Dispense Refill  . acetaminophen (TYLENOL) 325 MG tablet Take 650 mg by mouth as needed for mild pain.     Marland Kitchen alendronate (FOSAMAX) 70 MG tablet Take 1 tablet (70 mg total) by mouth once a week. Take with a full glass of water on an empty stomach. 12 tablet 3  . Alirocumab (PRALUENT) 150 MG/ML SOAJ Inject 150 mg into the skin every 14 (fourteen) days. 6 pen 3  . Ascorbic Acid (VITAMIN C ADULT GUMMIES PO) Take by mouth. 1 gummies daily    . Chlorpheniramine Maleate (ALLERGY RELIEF PO) Take 1 tablet by mouth as needed.    . Cholecalciferol (VITAMIN D) 125 MCG (5000 UT) CAPS Take 5,000 Units by mouth daily.     . Cyanocobalamin (VITAMIN B 12) 500 MCG TABS Take 1,000 mcg by mouth daily.     Marland Kitchen dofetilide (TIKOSYN) 250 MCG capsule TAKE ONE CAPSULE BY MOUTH TWICE A DAY 180 capsule 2  . ELIQUIS 5 MG TABS tablet TAKE 1 TABLET BY MOUTH TWICE A DAY 180 tablet 3  . etanercept (ENBREL) 50 MG/ML injection Inject 50 mg into the skin once a week.    . fluticasone (FLONASE) 50 MCG/ACT nasal spray Place 1 spray into both nostrils  as needed for allergies.     Marland Kitchen levothyroxine (SYNTHROID) 125 MCG tablet Take 1 tablet (125 mcg total) by mouth daily before breakfast. 90 tablet 0  . losartan (COZAAR) 50 MG tablet Take 1 tablet (50 mg total) by mouth in the morning and at bedtime. 180 tablet 3  . Melatonin 5 MG TABS Take 5 mg by mouth at bedtime.    Marland Kitchen omeprazole (PRILOSEC) 20 MG capsule Take 20 mg by mouth daily. Buys the [employer] brand.    . predniSONE (DELTASONE) 5 MG tablet Take by mouth as needed.    . sildenafil (REVATIO) 20 MG tablet Take 60 mg by mouth once a week.     . tamsulosin (FLOMAX) 0.4 MG CAPS capsule Take 0.4 mg by mouth at bedtime.     . triamcinolone cream (KENALOG) 0.1 % Apply 1 application topically as needed for itching.     . zinc gluconate 50 MG tablet Take 50 mg by mouth at bedtime.     No current facility-administered medications for this visit.    Allergies:   Codeine, Nsaids, Prednisone,  Statins, and Sulfa antibiotics    ROS:  Please see the history of present illness.   Otherwise, review of systems are positive for none.   All other systems are reviewed and negative.    PHYSICAL EXAM: VS:  BP (!) 144/92   Pulse 60   Ht 5\' 10"  (1.778 m)   Wt 221 lb (100.2 kg)   SpO2 98%   BMI 31.71 kg/m  , BMI Body mass index is 31.71 kg/m. GENERAL:  Well appearing NECK:  No jugular venous distention, waveform within normal limits, carotid upstroke brisk and symmetric, possible bilateral carotid bruits, no thyromegaly LUNGS:  Clear to auscultation bilaterally CHEST:  Well healed pacemaker pocket.   HEART:  PMI not displaced or sustained,S1 and S2 within normal limits, no S3, no S4, no clicks, no rubs, no murmurs ABD:  Flat, positive bowel sounds normal in frequency in pitch, no bruits, no rebound, no guarding, no midline pulsatile mass, no hepatomegaly, no splenomegaly EXT:  2 plus pulses throughout, no edema, no cyanosis no clubbing   EKG:  EKG is  ordered today. Atrial paced rhythm rate 68, first-degree AV block, ventricular tracking, left axis deviation, voltage criteria for left ventricular hypertrophy, no acute ST-T wave changes.  Recent Labs: 04/19/2020: TSH 1.670 10/18/2020: BUN 21; Creatinine, Ser 1.41; Hemoglobin 12.2; Magnesium 2.0; Platelets 190; Potassium 4.7; Sodium 142    Lipid Panel    Component Value Date/Time   CHOL 142 01/30/2020 1018   TRIG 174 (H) 01/30/2020 1018   HDL 40 01/30/2020 1018   CHOLHDL 3.6 01/30/2020 1018   LDLCALC 72 01/30/2020 1018      Wt Readings from Last 3 Encounters:  11/11/20 221 lb (100.2 kg)  10/22/20 217 lb 6.4 oz (98.6 kg)  10/18/20 217 lb (98.4 kg)      Other studies Reviewed: Additional studies/ records that were reviewed today include:  Labs, Care Everywhere Review of the above records demonstrates:  Please see elsewhere in the note.     ASSESSMENT AND PLAN:  Atrial fib His PF burden was 0.7%.   He tolerates  anticoagulation.  No change in therapy.  Pacemaker placement He is up-to-date with follow-up and I reviewed the 10/18/20 device check for this appt.    Left common iliac anuerysm He was stable on follow up with Dr. Fletcher Anon.  Plans for therapy would be 3 cm or growth greater  than 1 cm per year.  He is to have follow up again next year.  He had ABIs to evaluate leg pain.  These were normal.   I reviewed Dr. Tyrell Antonio notes  HTN Blood pressure is very mildly elevated.  Has been taking his losartan 50 mg twice a day on some days and I am going to increase this every day to 50 mg twice daily.   Murmur He has aortic sclerosis on echo in January.     Dyslipidemia He is being managed he is being managed with a PCSK9 inhibitor.  LDL was 72.  Dyspnea: He had a negative perfusion study.  He has had a negative pulmonary work-up.  I have instructed him to increase his physical activity and lose 3 pounds a month.  If his breathing gets worse rather than better then I would suggest that he might need a cardiac catheterization.    Bruit:   Current medicines are reviewed at length with the patient today.  The patient does not have concerns regarding medicines.  The following changes have been made:   Labs/ tests ordered today include:     Orders Placed This Encounter  Procedures  . Brain natriuretic peptide  . EKG 12-Lead  . VAS US CAROTID     Disposition:   FU with me in 3 months   Signed, Minus Breeding, MD  11/11/2020 11:54 AM    [place]

## 2020-11-11 ENCOUNTER — Ambulatory Visit (INDEPENDENT_AMBULATORY_CARE_PROVIDER_SITE_OTHER): Payer: Medicare Other | Admitting: Cardiology

## 2020-11-11 ENCOUNTER — Encounter: Payer: Self-pay | Admitting: Cardiology

## 2020-11-11 ENCOUNTER — Other Ambulatory Visit: Payer: Self-pay

## 2020-11-11 VITALS — BP 144/92 | HR 60 | Ht 70.0 in | Wt 221.0 lb

## 2020-11-11 DIAGNOSIS — I723 Aneurysm of iliac artery: Secondary | ICD-10-CM | POA: Diagnosis not present

## 2020-11-11 DIAGNOSIS — I10 Essential (primary) hypertension: Secondary | ICD-10-CM | POA: Diagnosis not present

## 2020-11-11 DIAGNOSIS — R0989 Other specified symptoms and signs involving the circulatory and respiratory systems: Secondary | ICD-10-CM | POA: Diagnosis not present

## 2020-11-11 DIAGNOSIS — R011 Cardiac murmur, unspecified: Secondary | ICD-10-CM | POA: Diagnosis not present

## 2020-11-11 DIAGNOSIS — I480 Paroxysmal atrial fibrillation: Secondary | ICD-10-CM | POA: Diagnosis not present

## 2020-11-11 DIAGNOSIS — R0602 Shortness of breath: Secondary | ICD-10-CM | POA: Diagnosis not present

## 2020-11-11 DIAGNOSIS — Z950 Presence of cardiac pacemaker: Secondary | ICD-10-CM | POA: Diagnosis not present

## 2020-11-11 MED ORDER — LOSARTAN POTASSIUM 50 MG PO TABS
50.0000 mg | ORAL_TABLET | Freq: Two times a day (BID) | ORAL | 3 refills | Status: DC
Start: 2020-11-11 — End: 2021-11-24

## 2020-11-11 NOTE — Patient Instructions (Signed)
Medication Instructions:  INCREASE LOSARTAN TO 50MG  TWICE A DAY *If you need a refill on your cardiac medications before your next appointment, please call your pharmacy*  Lab Work: Your physician recommends that you return for lab work TODAY (BNP) If you have labs (blood work) drawn today and your tests are completely normal, you will receive your results only by: Marland Kitchen MyChart Message (if you have MyChart) OR . A paper copy in the mail If you have any lab test that is abnormal or we need to change your treatment, we will call you to review the results.  Testing/Procedures: Your physician has requested that you have a carotid duplex. This test is an ultrasound of the carotid arteries in your neck. It looks at blood flow through these arteries that supply the brain with blood. Allow one hour for this exam. There are no restrictions or special instructions.  Follow-Up: At [facility], you and your health needs are our priority.  As part of our continuing mission to provide you with exceptional heart care, we have created designated Provider Care Teams.  These Care Teams include your primary Cardiologist (physician) and Advanced Practice Providers (APPs -  Physician Assistants and Nurse Practitioners) who all work together to provide you with the care you need, when you need it.    Your next appointment:   3 month(s)  The format for your next appointment:   In Person  Provider:   Minus Breeding, MD

## 2020-11-12 LAB — BRAIN NATRIURETIC PEPTIDE: BNP: 95.4 pg/mL (ref 0.0–100.0)

## 2020-11-21 ENCOUNTER — Encounter (HOSPITAL_COMMUNITY): Payer: Medicare Other

## 2020-11-27 ENCOUNTER — Ambulatory Visit (HOSPITAL_COMMUNITY)
Admission: RE | Admit: 2020-11-27 | Discharge: 2020-11-27 | Disposition: A | Discharge status: Home / Self Care | Payer: Medicare Other | Source: Ambulatory Visit | Attending: Cardiovascular Disease | Admitting: Cardiovascular Disease

## 2020-11-27 ENCOUNTER — Other Ambulatory Visit: Payer: Self-pay

## 2020-11-27 ENCOUNTER — Encounter: Payer: Self-pay | Admitting: *Deleted

## 2020-11-27 DIAGNOSIS — R0989 Other specified symptoms and signs involving the circulatory and respiratory systems: Secondary | ICD-10-CM | POA: Diagnosis not present

## 2020-11-28 ENCOUNTER — Telehealth: Payer: Self-pay | Admitting: Pharmacist

## 2020-11-28 MED ORDER — PRALUENT 75 MG/ML ~~LOC~~ SOAJ: 75.0000 mg | Freq: Once | SUBCUTANEOUS | 0 refills | Status: AC

## 2020-11-28 NOTE — Telephone Encounter (Signed)
Rx for Praluent 150mg .   Patient out of medication and requesting sample today. Only 1 sample of Praluent 75mg  available. Will provide to patient while refill processed.

## 2020-12-07 HISTORY — PX: OTHER SURGICAL HISTORY: SHX169

## 2020-12-09 ENCOUNTER — Telehealth: Payer: Self-pay

## 2020-12-09 NOTE — Telephone Encounter (Signed)
Left message for patient per DPR. No changes in therapy at this time per blood pressure logs received. Advised patient to call back with any questions.

## 2020-12-10 ENCOUNTER — Ambulatory Visit (INDEPENDENT_AMBULATORY_CARE_PROVIDER_SITE_OTHER): Payer: Medicare Other | Admitting: Internal Medicine

## 2020-12-10 ENCOUNTER — Other Ambulatory Visit: Payer: Self-pay

## 2020-12-10 ENCOUNTER — Encounter: Payer: Self-pay | Admitting: Internal Medicine

## 2020-12-10 VITALS — BP 113/78 | HR 75 | Temp 98.0°F | Resp 18 | Ht 70.0 in | Wt 205.2 lb

## 2020-12-10 DIAGNOSIS — H9193 Unspecified hearing loss, bilateral: Secondary | ICD-10-CM | POA: Diagnosis not present

## 2020-12-10 DIAGNOSIS — E785 Hyperlipidemia, unspecified: Secondary | ICD-10-CM

## 2020-12-10 DIAGNOSIS — R739 Hyperglycemia, unspecified: Secondary | ICD-10-CM

## 2020-12-10 DIAGNOSIS — Z1159 Encounter for screening for other viral diseases: Secondary | ICD-10-CM | POA: Diagnosis not present

## 2020-12-10 DIAGNOSIS — H9313 Tinnitus, bilateral: Secondary | ICD-10-CM | POA: Diagnosis not present

## 2020-12-10 DIAGNOSIS — E039 Hypothyroidism, unspecified: Secondary | ICD-10-CM | POA: Diagnosis not present

## 2020-12-10 LAB — LIPID PANEL
Cholesterol: 102 mg/dL (ref 0–200)
HDL: 37.8 mg/dL — ABNORMAL LOW (ref >39.00)
LDL Cholesterol: 36 mg/dL (ref 0–99)
NonHDL: 63.75
Total CHOL/HDL Ratio: 3
Triglycerides: 137 mg/dL (ref 0.0–149.0)
VLDL: 27.4 mg/dL (ref 0.0–40.0)

## 2020-12-10 LAB — TSH: TSH: 0.66 u[IU]/mL (ref 0.35–4.50)

## 2020-12-10 LAB — HEMOGLOBIN A1C: Hgb A1c MFr Bld: 5.8 % (ref 4.6–6.5)

## 2020-12-10 NOTE — Progress Notes (Signed)
Subjective:    Patient ID: Martin Rubio, male    DOB: 1946/02/11, 75 y.o.   MRN: [id number]  DOS:  12/10/2020 Type of visit - description: Follow-up Since the last office visit is doing about the same. Saw cardiology, note reviewed. DOE is unchanged. Request a ENT referral, he has a long history of HOH & tinnitus bilaterally.  BP Readings from Last 3 Encounters:  12/10/20 113/78  11/11/20 (!) 144/92  10/22/20 (!) 143/81     Review of Systems See above   Past Medical History:  Diagnosis Date  . Bronchitis   . Diverticulosis   . FH: BPH (benign prostatic hypertrophy)   . History of Graves' disease 01/19/2018   S/p ablation  . HTN (hypertension)   . Hyperlipidemia   . Obesity   . Psoriatic arthritis ([place])   . Rheumatoid arthritis ([place]) 08/07/2014  . Sleep apnea    CPAP  . Thyroid disease     Past Surgical History:  Procedure Laterality Date  . INGUINAL HERNIA REPAIR     bilateral  . KNEE SURGERY     bilateral arthroscopic  . PACEMAKER IMPLANT N/A 12/13/2019   Procedure: PACEMAKER IMPLANT;  Surgeon: Evans Lance, MD;  Location: [facility];  Service: Cardiovascular;  Laterality: N/A;  . TRANSURETHRAL RESECTION OF PROSTATE    . UMBILICAL HERNIA REPAIR      Allergies as of 12/10/2020      Reactions   Codeine    Tension, nasty feeling   Nsaids Other (See Comments)   Renal insufficiency   Prednisone Other (See Comments)   Unknown/ sometimes takes with lower dose   Statins Other (See Comments)   Joint pain   Sulfa Antibiotics Other (See Comments)   Joint pain      Medication List       Accurate as of December 10, 2020  9:49 PM. If you have any questions, ask your nurse or doctor.        acetaminophen 325 MG tablet Commonly known as: TYLENOL Take 650 mg by mouth as needed for mild pain.   alendronate 70 MG tablet Commonly known as: FOSAMAX Take 1 tablet (70 mg total) by mouth once a week. Take with a full glass of water on an empty stomach.    ALLERGY RELIEF PO Take 1 tablet by mouth as needed.   dofetilide 250 MCG capsule Commonly known as: TIKOSYN TAKE ONE CAPSULE BY MOUTH TWICE A DAY   Eliquis 5 MG Tabs tablet Generic drug: apixaban TAKE 1 TABLET BY MOUTH TWICE A DAY   Enbrel 50 MG/ML injection Generic drug: etanercept Inject 50 mg into the skin once a week.   fluticasone 50 MCG/ACT nasal spray Commonly known as: FLONASE Place 1 spray into both nostrils as needed for allergies.   FOLIC ACID PO Take by mouth.   levothyroxine 125 MCG tablet Commonly known as: SYNTHROID Take 1 tablet (125 mcg total) by mouth daily before breakfast.   losartan 50 MG tablet Commonly known as: COZAAR Take 1 tablet (50 mg total) by mouth in the morning and at bedtime.   melatonin 5 MG Tabs Take 5 mg by mouth at bedtime.   omeprazole 20 MG capsule Commonly known as: PRILOSEC Take 20 mg by mouth daily. Buys the [employer] brand.   Praluent 150 MG/ML Soaj Generic drug: Alirocumab Inject 150 mg into the skin every 14 (fourteen) days.   predniSONE 5 MG tablet Commonly known as: DELTASONE Take by mouth as  needed.   sildenafil 20 MG tablet Commonly known as: REVATIO Take 60 mg by mouth once a week.   tamsulosin 0.4 MG Caps capsule Commonly known as: FLOMAX Take 0.4 mg by mouth at bedtime.   triamcinolone 0.1 % Commonly known as: KENALOG Apply 1 application topically as needed for itching.   Vitamin B 12 500 MCG Tabs Take 1,000 mcg by mouth daily.   VITAMIN C ADULT GUMMIES PO Take by mouth. 1 gummies daily   Vitamin D 125 MCG (5000 UT) Caps Take 5,000 Units by mouth daily.   zinc gluconate 50 MG tablet Take 50 mg by mouth at bedtime.          Objective:   Physical Exam BP 113/78 (BP Location: Left Arm, Patient Position: Sitting, Cuff Size: Normal)   Pulse 75   Temp 98 F (36.7 C) (Oral)   Resp 18   Ht 5\' 10"  (1.778 m)   Wt 205 lb 4 oz (93.1 kg)   SpO2 94%   BMI 29.45 kg/m  General:   Well developed,  NAD, BMI noted. HEENT:  Normocephalic . Face symmetric, atraumatic TMs: Normal. Lungs:  CTA B Normal respiratory effort, no intercostal retractions, no accessory muscle use. Heart: Seems regular today, + systolic murmur.  Lower extremities: no pretibial edema bilaterally  Skin: Not pale. Not jaundice Neurologic:  alert & oriented X3.  Speech normal, gait appropriate for age and unassisted Psych--  Cognition and judgment appear intact.  Cooperative with normal attention span and concentration.  Behavior appropriate. No anxious or depressed appearing.      Assessment    ASSESSMENT  (transfer to me 12/2019) Hyperglycemia HTN High cholesterol, seen at the lipid clinic Hypothyroidism  Psoriatic Arthritis [name]  Osteoporosis: on fosamax rx by PCP, start holiday by mid 2022 (see visit note from 02/29/2020) CKD mild,stable . Creatinine ~1.5 CV: -Paroxysmal A. Fib, dx 09/2019 anticoagulated -Sick sinus syndrome, s/p pacemaker (12/13/2019) -Septal hypertrophy per echo -Left common iliac aneurysm, S/U cardiology OSA on CPAP BPH [name]  PLAN HTN: BP today is very good, last BMP satisfactory, continue losartan. High cholesterol: Follow-up by the lipid clinic, on Praluent, checking a FLP. Hypothyroidism: On Synthroid, check TSH. Atrial fibrillation Saw cardiology 11/11/2020.  For A. fib they recommend to continue anticoagulation. H/o  iliac aneurysm, f/u by  Dr. Fletcher Anon. HOH, tinnitus: Request ENT referral, his goal is for ENT to  recommend a hearing aid.  ( declined audiologist referral) ED: Also reports poor response for sildenafil, wonders about advertised/alternative treatments.  Advised to stick with sildenafil, I know there are some novel treatments provided by urology, rec to d/w  Dr. Felipa Eth. Hyperglycemia: Per chart review, check A1c. RTC 6 months   This visit occurred during the SARS-CoV-2 public health emergency.  Safety protocols were in place, including  screening questions prior to the visit, additional usage of staff PPE, and extensive cleaning of exam room while observing appropriate contact time as indicated for disinfecting solutions.

## 2020-12-10 NOTE — Assessment & Plan Note (Addendum)
HTN: BP today is very good, last BMP satisfactory, continue losartan. High cholesterol: Follow-up by the lipid clinic, on Praluent, checking a FLP. Hypothyroidism: On Synthroid, check TSH. Atrial fibrillation Saw cardiology 11/11/2020.  For A. fib they recommend to continue anticoagulation. H/o  iliac aneurysm, f/u by  Dr. Fletcher Anon. HOH, tinnitus: Request ENT referral, his goal is for ENT to  recommend a hearing aid.  ( declined audiologist referral) ED: Also reports poor response for sildenafil, wonders about advertised/alternative treatments.  Advised to stick with sildenafil, I know there are some novel treatments provided by urology, rec to d/w  Dr. Felipa Eth. Hyperglycemia: Per chart review, check A1c. RTC 6 months

## 2020-12-10 NOTE — Progress Notes (Signed)
Pre visit review using our clinic review tool, if applicable. No additional management support is needed unless otherwise documented below in the visit note. 

## 2020-12-10 NOTE — Patient Instructions (Signed)
Check the  blood pressure regularly.  BP GOAL is between 110/65 and  135/85. If it is consistently higher or lower, let me know    GO TO THE LAB : Get the blood work     GO TO THE FRONT DESK, PLEASE SCHEDULE YOUR APPOINTMENTS Come back for a checkup in 6 months 

## 2020-12-11 LAB — HEPATITIS C ANTIBODY
Hepatitis C Ab: NONREACTIVE
SIGNAL TO CUT-OFF: 0.05 (ref <1.00)

## 2020-12-12 ENCOUNTER — Ambulatory Visit (INDEPENDENT_AMBULATORY_CARE_PROVIDER_SITE_OTHER): Payer: Medicare Other

## 2020-12-12 DIAGNOSIS — I495 Sick sinus syndrome: Secondary | ICD-10-CM | POA: Diagnosis not present

## 2020-12-12 LAB — CUP PACEART REMOTE DEVICE CHECK
Battery Remaining Longevity: 140 mo
Battery Voltage: 3.06 V
Brady Statistic AP VP Percent: 13.75 %
Brady Statistic AP VS Percent: 42.99 %
Brady Statistic AS VP Percent: 0.52 %
Brady Statistic AS VS Percent: 42.75 %
Brady Statistic RA Percent Paced: 56.34 %
Brady Statistic RV Percent Paced: 14.3 %
Date Time Interrogation Session: 20220105213731
Implantable Lead Implant Date: 20210106
Implantable Lead Implant Date: 20210106
Implantable Lead Location: 753859
Implantable Lead Location: 753860
Implantable Lead Model: 3830
Implantable Lead Model: 5076
Implantable Pulse Generator Implant Date: 20210106
Lead Channel Impedance Value: 323 Ohm
Lead Channel Impedance Value: 361 Ohm
Lead Channel Impedance Value: 380 Ohm
Lead Channel Impedance Value: 532 Ohm
Lead Channel Pacing Threshold Amplitude: 0.5 V
Lead Channel Pacing Threshold Amplitude: 0.625 V
Lead Channel Pacing Threshold Pulse Width: 0.4 ms
Lead Channel Pacing Threshold Pulse Width: 0.4 ms
Lead Channel Sensing Intrinsic Amplitude: 1.375 mV
Lead Channel Sensing Intrinsic Amplitude: 1.375 mV
Lead Channel Sensing Intrinsic Amplitude: 28.625 mV
Lead Channel Sensing Intrinsic Amplitude: 28.625 mV
Lead Channel Setting Pacing Amplitude: 1.5 V
Lead Channel Setting Pacing Amplitude: 2.5 V
Lead Channel Setting Pacing Pulse Width: 0.4 ms
Lead Channel Setting Sensing Sensitivity: 2 mV

## 2020-12-18 DIAGNOSIS — N401 Enlarged prostate with lower urinary tract symptoms: Secondary | ICD-10-CM | POA: Diagnosis not present

## 2020-12-18 DIAGNOSIS — N138 Other obstructive and reflux uropathy: Secondary | ICD-10-CM | POA: Diagnosis not present

## 2020-12-25 DIAGNOSIS — N529 Male erectile dysfunction, unspecified: Secondary | ICD-10-CM | POA: Diagnosis not present

## 2020-12-25 DIAGNOSIS — N401 Enlarged prostate with lower urinary tract symptoms: Secondary | ICD-10-CM | POA: Diagnosis not present

## 2020-12-25 DIAGNOSIS — N138 Other obstructive and reflux uropathy: Secondary | ICD-10-CM | POA: Diagnosis not present

## 2020-12-26 NOTE — Progress Notes (Signed)
Remote pacemaker transmission.   

## 2020-12-31 ENCOUNTER — Encounter (INDEPENDENT_AMBULATORY_CARE_PROVIDER_SITE_OTHER): Payer: Self-pay | Admitting: Otolaryngology

## 2020-12-31 ENCOUNTER — Ambulatory Visit (INDEPENDENT_AMBULATORY_CARE_PROVIDER_SITE_OTHER): Payer: Medicare Other | Admitting: Otolaryngology

## 2020-12-31 ENCOUNTER — Other Ambulatory Visit: Payer: Self-pay

## 2020-12-31 VITALS — Temp 97.3°F

## 2020-12-31 DIAGNOSIS — H903 Sensorineural hearing loss, bilateral: Secondary | ICD-10-CM

## 2020-12-31 DIAGNOSIS — H9313 Tinnitus, bilateral: Secondary | ICD-10-CM

## 2020-12-31 DIAGNOSIS — H6121 Impacted cerumen, right ear: Secondary | ICD-10-CM

## 2020-12-31 NOTE — Progress Notes (Signed)
HPI: Martin Rubio is a 75 y.o. male who presents is referred by his PCP Dr. Larose Kells for evaluation of hearing problems and chronic tinnitus that he has had for years.  He got hearing aids from [employer] about 10 years ago but did not like them and is not wearing them.  More recently his wife has complained about his hearing.  He is a Chief Financial Officer for [employer].Marland Kitchen He also complains of tinnitus in both ears.  Past Medical History:  Diagnosis Date  . Bronchitis   . Diverticulosis   . FH: BPH (benign prostatic hypertrophy)   . History of Graves' disease 01/19/2018   S/p ablation  . HTN (hypertension)   . Hyperlipidemia   . Obesity   . Psoriatic arthritis ([place])   . Rheumatoid arthritis ([place]) 08/07/2014  . Sleep apnea    CPAP  . Thyroid disease    Past Surgical History:  Procedure Laterality Date  . INGUINAL HERNIA REPAIR     bilateral  . KNEE SURGERY     bilateral arthroscopic  . PACEMAKER IMPLANT N/A 12/13/2019   Procedure: PACEMAKER IMPLANT;  Surgeon: Evans Lance, MD;  Location: [facility];  Service: Cardiovascular;  Laterality: N/A;  . TRANSURETHRAL RESECTION OF PROSTATE    . UMBILICAL HERNIA REPAIR     Social History   Socioeconomic History  . Marital status: Married    Spouse name: Not on file  . Number of children: 2  . Years of education: Not on file  . Highest education level: Not on file  Occupational History  . Occupation: Retired    Fish farm manager: [employer]  Tobacco Use  . Smoking status: Former Smoker    Types: Cigarettes  . Smokeless tobacco: Never Used  . Tobacco comment: Quit 46 years ago.  Vaping Use  . Vaping Use: Never used  Substance and Sexual Activity  . Alcohol use: No  . Drug use: No  . Sexual activity: Not on file  Other Topics Concern  . Not on file  Social History Narrative   Lives with wife.   Social Determinants of Health   Financial Resource Strain: Not on file  Food Insecurity: Not on file  Transportation Needs: Not on file  Physical  Activity: Not on file  Stress: Not on file  Social Connections: Not on file   Family History  Problem Relation Age of Onset  . CAD Father 65       Died age 23  . CAD Mother 44       CABG  . Diabetes Brother    Allergies  Allergen Reactions  . Codeine     Tension, nasty feeling   . Nsaids Other (See Comments)    Renal insufficiency  . Prednisone Other (See Comments)    Unknown/ sometimes takes with lower dose  . Statins Other (See Comments)    Joint pain   . Sulfa Antibiotics Other (See Comments)    Joint pain   Prior to Admission medications   Medication Sig Start Date End Date Taking? Authorizing Provider  acetaminophen (TYLENOL) 325 MG tablet Take 650 mg by mouth as needed for mild pain.     [provider]  alendronate (FOSAMAX) 70 MG tablet Take 1 tablet (70 mg total) by mouth once a week. Take with a full glass of water on an empty stomach. 03/20/20   Colon Branch, MD  Alirocumab (PRALUENT) 150 MG/ML SOAJ Inject 150 mg into the skin every 14 (fourteen) days. 01/29/20  Minus Breeding, MD  Ascorbic Acid (VITAMIN C ADULT GUMMIES PO) Take by mouth. 1 gummies daily    [provider]  Chlorpheniramine Maleate (ALLERGY RELIEF PO) Take 1 tablet by mouth as needed.    [provider]  Cholecalciferol (VITAMIN D) 125 MCG (5000 UT) CAPS Take 5,000 Units by mouth daily.     [provider]  Cyanocobalamin (VITAMIN B 12) 500 MCG TABS Take 1,000 mcg by mouth daily.  02/29/20   Colon Branch, MD  dofetilide (TIKOSYN) 250 MCG capsule TAKE ONE CAPSULE BY MOUTH TWICE A DAY 10/03/20   Evans Lance, MD  ELIQUIS 5 MG TABS tablet TAKE 1 TABLET BY MOUTH TWICE A DAY 09/04/20   Shirley Friar, PA-C  etanercept (ENBREL) 50 MG/ML injection Inject 50 mg into the skin once a week.    [provider]  fluticasone (FLONASE) 50 MCG/ACT nasal spray Place 1 spray into both nostrils as needed for allergies.     [provider]  FOLIC ACID PO  Take by mouth.    [provider]  levothyroxine (SYNTHROID) 125 MCG tablet Take 1 tablet (125 mcg total) by mouth daily before breakfast. 10/04/20   Colon Branch, MD  losartan (COZAAR) 50 MG tablet Take 1 tablet (50 mg total) by mouth in the morning and at bedtime. 11/11/20   Minus Breeding, MD  Melatonin 5 MG TABS Take 5 mg by mouth at bedtime.    [provider]  omeprazole (PRILOSEC) 20 MG capsule Take 20 mg by mouth daily. Buys the [employer] brand.    [provider]  predniSONE (DELTASONE) 5 MG tablet Take by mouth as needed. 09/23/20   [provider]  sildenafil (REVATIO) 20 MG tablet Take 60 mg by mouth once a week. 11/28/19   [provider]  tamsulosin (FLOMAX) 0.4 MG CAPS capsule Take 0.4 mg by mouth at bedtime.  03/01/19   [provider]  triamcinolone cream (KENALOG) 0.1 % Apply 1 application topically as needed for itching.  08/22/19   [provider]  zinc gluconate 50 MG tablet Take 50 mg by mouth at bedtime.    [provider]     Positive ROS: Otherwise negative  All other systems have been reviewed and were otherwise negative with the exception of those mentioned in the HPI and as above.  Physical Exam: Constitutional: Alert, well-appearing, no acute distress Ears: External ears without lesions or tenderness.  Right ear canal has wax buildup that was removed with forceps.  Left ear canal had minimal wax that was cleaned with a curette.  Both TMs were clear. Nasal: External nose without lesions.. Clear nasal passages Oral: Lips and gums without lesions. Tongue and palate mucosa without lesions. Posterior oropharynx clear. Neck: No palpable adenopathy or masses Respiratory: Breathing comfortably  Skin: No facial/neck lesions or rash noted.  Audiogram demonstrated moderate to severe downsloping sensorineural hearing loss in both ears consistent with presbycusis.  He had type A tympanograms  bilaterally.  Cerumen impaction removal  Date/Time: 12/31/2020 2:59 PM Performed by: Rozetta Nunnery, MD Authorized by: Rozetta Nunnery, MD   Consent:    Consent obtained:  Verbal   Consent given by:  Patient   Risks discussed:  Pain and bleeding Procedure details:    Location:  L ear and R ear   Procedure type: curette and forceps   Post-procedure details:    Inspection:  TM intact and canal normal   Hearing quality:  Improved   Patient tolerance of procedure:  Tolerated well, no immediate complications Comments:     TMs are clear bilaterally.    Assessment: Bilateral sensorineural hearing loss consistent with presbycusis Minimal wax buildup on the right side.  Plan: Recommended seeing our audiologist about hearing aids. Concerning his complaints of tinnitus recommended use of masking noise.  He has used melatonin in the past and seems to help some.   Radene Journey, MD   CC:

## 2021-01-08 ENCOUNTER — Encounter (HOSPITAL_COMMUNITY): Payer: Self-pay | Admitting: Physician Assistant

## 2021-01-08 ENCOUNTER — Other Ambulatory Visit: Payer: Self-pay

## 2021-01-08 ENCOUNTER — Ambulatory Visit (HOSPITAL_COMMUNITY)
Admission: RE | Admit: 2021-01-08 | Discharge: 2021-01-08 | Disposition: A | Discharge status: Home / Self Care | Payer: Medicare Other | Source: Ambulatory Visit | Attending: Physician Assistant | Admitting: Physician Assistant

## 2021-01-08 VITALS — BP 110/70 | HR 87 | Ht 70.0 in | Wt 201.0 lb

## 2021-01-08 DIAGNOSIS — I480 Paroxysmal atrial fibrillation: Secondary | ICD-10-CM | POA: Insufficient documentation

## 2021-01-08 DIAGNOSIS — Z950 Presence of cardiac pacemaker: Secondary | ICD-10-CM | POA: Insufficient documentation

## 2021-01-08 DIAGNOSIS — I440 Atrioventricular block, first degree: Secondary | ICD-10-CM | POA: Diagnosis not present

## 2021-01-08 DIAGNOSIS — Z79899 Other long term (current) drug therapy: Secondary | ICD-10-CM | POA: Insufficient documentation

## 2021-01-08 DIAGNOSIS — D6869 Other thrombophilia: Secondary | ICD-10-CM | POA: Insufficient documentation

## 2021-01-08 DIAGNOSIS — I4892 Unspecified atrial flutter: Secondary | ICD-10-CM | POA: Diagnosis not present

## 2021-01-08 DIAGNOSIS — Z7901 Long term (current) use of anticoagulants: Secondary | ICD-10-CM | POA: Diagnosis not present

## 2021-01-08 DIAGNOSIS — I517 Cardiomegaly: Secondary | ICD-10-CM | POA: Insufficient documentation

## 2021-01-08 DIAGNOSIS — I4891 Unspecified atrial fibrillation: Secondary | ICD-10-CM | POA: Diagnosis present

## 2021-01-08 DIAGNOSIS — E785 Hyperlipidemia, unspecified: Secondary | ICD-10-CM | POA: Insufficient documentation

## 2021-01-08 DIAGNOSIS — I10 Essential (primary) hypertension: Secondary | ICD-10-CM | POA: Diagnosis not present

## 2021-01-08 DIAGNOSIS — G4733 Obstructive sleep apnea (adult) (pediatric): Secondary | ICD-10-CM | POA: Diagnosis not present

## 2021-01-08 DIAGNOSIS — R0600 Dyspnea, unspecified: Secondary | ICD-10-CM | POA: Insufficient documentation

## 2021-01-08 LAB — BASIC METABOLIC PANEL
Anion gap: 11 (ref 5–15)
BUN: 16 mg/dL (ref 8–23)
CO2: 21 mmol/L — ABNORMAL LOW (ref 22–32)
Calcium: 9.4 mg/dL (ref 8.9–10.3)
Chloride: 107 mmol/L (ref 98–111)
Creatinine, Ser: 1.51 mg/dL — ABNORMAL HIGH (ref 0.61–1.24)
GFR, Estimated: 48 mL/min — ABNORMAL LOW (ref >60)
Glucose, Bld: 138 mg/dL — ABNORMAL HIGH (ref 70–99)
Potassium: 4.3 mmol/L (ref 3.5–5.1)
Sodium: 139 mmol/L (ref 135–145)

## 2021-01-08 LAB — MAGNESIUM: Magnesium: 1.9 mg/dL (ref 1.7–2.4)

## 2021-01-08 NOTE — Progress Notes (Signed)
Patient is being seen in the Atrial Fibrillation Clinic. VS, EKG, and device interrogations performed in the clinic. Clint Fenton, PA is at home and seeing patients via telemedicine as he is in quarantine.  

## 2021-01-08 NOTE — Progress Notes (Addendum)
Due to national recommendations of social distancing due to [place] 19, Audio/video telehealth visit is felt to be most appropriate for this patient at this time.  See consent below from today for patient consent regarding telehealth for the Atrial Fibrillation Clinic.    Patient Location: [facility] Provider location: [address] [place], [place] [address] Evaluation Performed: Follow up  Primary Care Physician: Colon Branch, MD Primary Cardiologist: Dr [name]  Primary Electrophysiologist: Dr [name] Referring Physician: Dr [name] is a 75 y.o. male with a history of HTN, paroxysmal atrial fibrillation, atrial flutter, OSA, HLD, Grave's disease, sinus node dysfunction s/p PPM who presents for [facility]. Patient is on Eliquis for a CHADS2VASC score of 3. Patient underwent PPM implant on 12/13/19 for his sinus node dysfunction. Patient has had episodes of afib lasting 2-3 hours with symptoms of fatigue and dyspnea with exertion. He was seen by Dr [name] on 01/29/20 who recommended dofetilide. Patient is s/p dofetilide loading 02/2020.  On follow up today, patient reports he has done well since his last visit. He has had only brief episodes of afib. AF burden on device is 0.4%. He has been working on diet and exercise and has lost ~17 lbs. However, he reports his dyspnea with exertion is only mildly improved.   Today, he denies symptoms of palpitations, chest pain, orthopnea, PND, lower extremity edema, presyncope, syncope, bleeding, or neurologic sequela. The patient is tolerating medications without difficulties and is otherwise without complaint today.    Atrial Fibrillation Risk Factors:  he does have symptoms or diagnosis of sleep apnea. he is compliant with CPAP therapy. he does not have a history of rheumatic fever. he does have a history of alcohol use.   he has a BMI of Body mass index is 28.84 kg/m.Marland Kitchen Filed  Weights   01/08/21 1023  Weight: 91.2 kg    Family History  Problem Relation Age of Onset  . CAD Father 18       Died age 13  . CAD Mother 52       CABG  . Diabetes Brother      Atrial Fibrillation Management history:  Previous antiarrhythmic drugs: dofetilide Previous cardioversions: none Previous ablations: none CHADS2VASC score: 3 Anticoagulation history: Eliquis   Past Medical History:  Diagnosis Date  . Bronchitis   . Diverticulosis   . FH: BPH (benign prostatic hypertrophy)   . History of Graves' disease 01/19/2018   S/p ablation  . HTN (hypertension)   . Hyperlipidemia   . Obesity   . Psoriatic arthritis ([place])   . Rheumatoid arthritis ([place]) 08/07/2014  . Sleep apnea    CPAP  . Thyroid disease    Past Surgical History:  Procedure Laterality Date  . INGUINAL HERNIA REPAIR     bilateral  . KNEE SURGERY     bilateral arthroscopic  . PACEMAKER IMPLANT N/A 12/13/2019   Procedure: PACEMAKER IMPLANT;  Surgeon: Evans Lance, MD;  Location: [facility];  Service: Cardiovascular;  Laterality: N/A;  . TRANSURETHRAL RESECTION OF PROSTATE    . UMBILICAL HERNIA REPAIR      Current Outpatient Medications  Medication Sig Dispense Refill  . acetaminophen (TYLENOL) 325 MG tablet Take 650 mg by mouth as needed for mild pain.     . Alirocumab (PRALUENT) 150 MG/ML SOAJ Inject 150 mg into the skin every 14 (fourteen) days. 6 pen 3  . Ascorbic Acid (VITAMIN  C ADULT GUMMIES PO) Take by mouth. 1 gummies daily    . Chlorpheniramine Maleate (ALLERGY RELIEF PO) Take 1 tablet by mouth as needed.    . Cholecalciferol (VITAMIN D) 125 MCG (5000 UT) CAPS Take 5,000 Units by mouth daily.     . Cyanocobalamin (VITAMIN B 12) 500 MCG TABS Take 1,000 mcg by mouth daily.     Marland Kitchen dofetilide (TIKOSYN) 250 MCG capsule TAKE ONE CAPSULE BY MOUTH TWICE A DAY 180 capsule 2  . ELIQUIS 5 MG TABS tablet TAKE 1 TABLET BY MOUTH TWICE A DAY 180 tablet 3  . etanercept (ENBREL) 50 MG/ML injection  Inject 50 mg into the skin once a week.    . fluticasone (FLONASE) 50 MCG/ACT nasal spray Place 1 spray into both nostrils as needed for allergies.     Marland Kitchen FOLIC ACID PO Take by mouth.    . levothyroxine (SYNTHROID) 125 MCG tablet Take 1 tablet (125 mcg total) by mouth daily before breakfast. 90 tablet 0  . losartan (COZAAR) 50 MG tablet Take 1 tablet (50 mg total) by mouth in the morning and at bedtime. 180 tablet 3  . Melatonin 5 MG TABS Take 5 mg by mouth at bedtime.    Marland Kitchen omeprazole (PRILOSEC) 20 MG capsule Take 20 mg by mouth daily. Buys the [employer] brand.    . predniSONE (DELTASONE) 5 MG tablet Take by mouth as needed.    . tadalafil (CIALIS) 20 MG tablet Take 20 mg by mouth daily as needed.    . tamsulosin (FLOMAX) 0.4 MG CAPS capsule Take 0.4 mg by mouth at bedtime.     . triamcinolone cream (KENALOG) 0.1 % Apply 1 application topically as needed for itching.     . zinc gluconate 50 MG tablet Take 50 mg by mouth at bedtime.     No current facility-administered medications for this encounter.    Allergies  Allergen Reactions  . Codeine     Tension, nasty feeling   . Nsaids Other (See Comments)    Renal insufficiency  . Prednisone Other (See Comments)    Unknown/ sometimes takes with lower dose  . Statins Other (See Comments)    Joint pain   . Sulfa Antibiotics Other (See Comments)    Joint pain    Social History   Socioeconomic History  . Marital status: Married    Spouse name: Not on file  . Number of children: 2  . Years of education: Not on file  . Highest education level: Not on file  Occupational History  . Occupation: Retired    Fish farm manager: [employer]  Tobacco Use  . Smoking status: Former Smoker    Types: Cigarettes  . Smokeless tobacco: Never Used  . Tobacco comment: Quit 46 years ago.  Vaping Use  . Vaping Use: Never used  Substance and Sexual Activity  . Alcohol use: No  . Drug use: No  . Sexual activity: Not on file  Other Topics Concern  . Not on  file  Social History Narrative   Lives with wife.   Social Determinants of Health   Financial Resource Strain: Not on file  Food Insecurity: Not on file  Transportation Needs: Not on file  Physical Activity: Not on file  Stress: Not on file  Social Connections: Not on file  Intimate Partner Violence: Not on file     ROS- All systems are reviewed and negative except as per the HPI above.  Physical Exam: Vitals:   01/08/21 1023  BP: 110/70  Pulse: 87  Weight: 91.2 kg  Height: 5\' 10"  (1.778 m)    Well appearing, alert and conversant, regular work of breathing,  good skin color Eyes- anicteric, neuro- grossly intact, skin- no apparent rash or lesions or cyanosis, mouth- oral mucosa is pink   Wt Readings from Last 3 Encounters:  01/08/21 91.2 kg  12/10/20 93.1 kg  11/11/20 100.2 kg    EKG today demonstrates SR, 1st degree AV block, LVH Vent. rate 87 BPM PR interval 230 ms QRS duration 82 ms QT/QTc 376/452 ms  Echo 12/16/19 demonstrated  1. Left ventricular ejection fraction, by visual estimation, is 55 to  60%. The left ventricle has normal function. There is no increased left  ventricular wall thickness.  2. Left ventricular diastolic parameters are consistent with Grade I  diastolic dysfunction (impaired relaxation).  3. The left ventricle has no regional wall motion abnormalities.  4. Global right ventricle has normal systolc function.The right  ventricular size is normal. no increase in right ventricular wall  thickness.  5. The mitral valve is normal in structure. No evidence of mitral valve  regurgitation. No evidence of mitral stenosis.  6. The tricuspid valve was normal in structure. Tricuspid valve  regurgitation is not demonstrated.  7. Tricuspid valve regurgitation is not demonstrated.  8. Mild aortic valve sclerosis without stenosis.  9. The pulmonic valve was normal in structure.  10. The inferior vena cava is normal in size with greater  than 50%  respiratory variability, suggesting right atrial pressure of 3 mmHg.   Epic records are reviewed at length today  CHA2DS2-VASc Score = 3 The patient's score is based upon: CHF History: No HTN History: Yes Age : 80 + Diabetes History: No Stroke History: No Vascular Disease History: No Gender: Male   ASSESSMENT AND PLAN: 1. Paroxysmal Atrial Fibrillation/atrial flutter The patient's CHA2DS2-VASc score is 3, indicating a 3.2% annual risk of stroke.   Last device interrogation shows 0.4% afib burden. Continue dofetilide 250 mcg BID. QT stable. Check bmet/mag today. Continue Eliquis 5 mg BID  2. Secondary Hypercoagulable State (ICD10:  D68.69) The patient is at significant risk for stroke/thromboembolism based upon his CHA2DS2-VASc Score of 3.  Continue Apixaban (Eliquis).   3. Obstructive sleep apnea Patient reports compliance with CPAP therapy.  4. HTN Stable, no changes today.  5. Sinus node dysfunction S/p PPM, followed by Dr [name] and the device clinic.  6. Dyspnea on exertion Negative perfusion study and unremarkable pulmonary workup. His dyspnea is only mildly improved with weight loss.  Possible LHC per Dr [name].    Follow up with Dr [name] as scheduled and Dr [name] per recall.    [facility] [address] [place], [place] [address] [phone] 01/08/2021 10:44 AM

## 2021-01-09 ENCOUNTER — Other Ambulatory Visit: Payer: Self-pay | Admitting: Internal Medicine

## 2021-01-09 DIAGNOSIS — E890 Postprocedural hypothyroidism: Secondary | ICD-10-CM

## 2021-01-21 DIAGNOSIS — G4733 Obstructive sleep apnea (adult) (pediatric): Secondary | ICD-10-CM | POA: Diagnosis not present

## 2021-01-31 ENCOUNTER — Other Ambulatory Visit: Payer: Self-pay

## 2021-01-31 ENCOUNTER — Telehealth (INDEPENDENT_AMBULATORY_CARE_PROVIDER_SITE_OTHER): Payer: Medicare Other | Admitting: Medical

## 2021-01-31 ENCOUNTER — Other Ambulatory Visit (INDEPENDENT_AMBULATORY_CARE_PROVIDER_SITE_OTHER): Payer: Medicare Other

## 2021-01-31 VITALS — BP 127/62 | HR 60 | Temp 97.0°F

## 2021-01-31 DIAGNOSIS — Z20822 Contact with and (suspected) exposure to covid-19: Secondary | ICD-10-CM | POA: Diagnosis not present

## 2021-01-31 DIAGNOSIS — R059 Cough, unspecified: Secondary | ICD-10-CM

## 2021-01-31 DIAGNOSIS — J029 Acute pharyngitis, unspecified: Secondary | ICD-10-CM

## 2021-01-31 DIAGNOSIS — R39198 Other difficulties with micturition: Secondary | ICD-10-CM

## 2021-01-31 DIAGNOSIS — R350 Frequency of micturition: Secondary | ICD-10-CM

## 2021-01-31 DIAGNOSIS — R0981 Nasal congestion: Secondary | ICD-10-CM

## 2021-01-31 LAB — POC URINALSYSI DIPSTICK (AUTOMATED)
Bilirubin, UA: NEGATIVE
Blood, UA: NEGATIVE
Glucose, UA: NEGATIVE
Ketones, UA: NEGATIVE
Leukocytes, UA: NEGATIVE
Nitrite, UA: NEGATIVE
Protein, UA: POSITIVE — AB
Spec Grav, UA: 1.02 (ref 1.010–1.025)
Urobilinogen, UA: 0.2 E.U./dL
pH, UA: 5 (ref 5.0–8.0)

## 2021-01-31 MED ORDER — BENZONATATE 100 MG PO CAPS: 100.0000 mg | ORAL_CAPSULE | Freq: Three times a day (TID) | ORAL | 0 refills | Status: DC | PRN

## 2021-01-31 MED ORDER — CIPROFLOXACIN HCL 500 MG PO TABS: 500.0000 mg | ORAL_TABLET | Freq: Two times a day (BID) | ORAL | 0 refills | Status: DC

## 2021-01-31 NOTE — Patient Instructions (Addendum)
Recent onset of nasal congestion, sore throat, loose stools, low-grade fever and cough.  Then later began with difficulty urinating with frequent urination.    With recent upper respiratory symptoms as well as other symptoms Covid infection is possibility the infections have been decreasing.  Patient been vaccinated with one COVID vaccine.  Advised patient to continue Flonase for nasal congestion and prescribed benzonatate for cough.  Asked that he get rapid test over-the-counter and check today.  If test is negative then asked him to call COVID test centerline to get scheduled for PCR later today or on Monday morning.  Placed chest x-ray today but he declined getting it today.  Possibly a bit early to get chest x-ray.  Can get early next week as needed.  Recent urinary symptoms of frequent urination and difficulty urinating causes consideration for prostatitis versus UTI.  With above symptoms and not knowing whether he has COVID limits ability to get CBC and PSA in our office today.  Decided to go ahead and prescribe Cipro antibiotic.  Advised patient he can come to our office and have his wife pick up sterile cup so we can give a sample.  Wife will bring a urine sample to the lab.  We will run the UA and send for culture.  Next week when you get PCR result back will have patient come in for CBC and PSA provided pcr negative.  Follow up 7 days or as needed

## 2021-01-31 NOTE — Progress Notes (Signed)
Subjective:    Patient ID: Martin Rubio, male    DOB: [birth year], 75 y.o.   MRN: [id number]  HPI  Virtual Visit via Video Note  I connected with Martin Rubio on 01/31/21 at  9:40 AM EST by a video enabled telemedicine application and verified that I am speaking with the correct person using two identifiers.  Location: Patient: home Provider: office   I discussed the limitations of evaluation and management by telemedicine and the availability of in person appointments. The patient expressed understanding and agreed to proceed.  History of Present Illness:  Pt states Wednesday morning he started to feel feverish.   Pt states last night fever broke. Pt has had st progressively last 2 days. He gargled with over product and pain decreased alot. Use povidine iodine solution. Pt also has cough. Tussin DM. Pt had one covid vaccine in the past. He also had some loose stool yesterday and today. No known contact to strep throat. No children exposure. Some nasal congestion. No wheezing or sob.    Pt also is having some pain on urination. Pt states recent hard to start flow of urine. States can eventually urinate after hour of urge to urinate.  Pt thinks no cva area. But some back pain. Pt had history of prostatitis in past.  Pt had one covid vaccine.    Observations/Objective:  General-no acute distress, pleasant, oriented. Lungs- on inspection lungs appear unlabored. Neck- no tracheal deviation or jvd on inspection. Neuro- gross motor function appears intact.  Assessment and Plan: Recent onset of nasal congestion, sore throat, loose stools, low-grade fever and cough.  Then later began with difficulty urinating with frequent urination.    With recent upper respiratory symptoms as well as other symptoms Covid infection is possibility the infections have been decreasing.  Patient been vaccinated with one COVID vaccine.  Advised patient to continue Flonase for nasal congestion and  prescribed benzonatate for cough.  Asked that he get rapid test over-the-counter and check today.  If test is negative then asked him to call COVID test centerline to get scheduled for PCR later today or on Monday morning.  Placed chest x-ray today but he declined getting it today.  Possibly a bit early to get chest x-ray.  Can get early next week as needed.  Recent urinary symptoms of frequent urination and difficulty urinating causes consideration for prostatitis versus UTI.  With above symptoms and not knowing whether he has COVID limits ability to get CBC and PSA in our office today.  Decided to go ahead and prescribe Cipro antibiotic.  Advised patient he can come to our office and have his wife pick up sterile cup so we can give a sample.  Wife will bring a urine sample to the lab.  We will run the UA and send for culture.  Next week when you get PCR result back will have patient come in for CBC and PSA provided pcr negative.  Follow up 7 days or as needed  Time spent with patient today was  30 minutes which consisted of chart review, discussing diagnosis, work up treatment and documentation.  Mackie Pai, PA-C  Follow Up Instructions:    I discussed the assessment and treatment plan with the patient. The patient was provided an opportunity to ask questions and all were answered. The patient agreed with the plan and demonstrated an understanding of the instructions.   The patient was advised to call back or seek an in-person evaluation if  the symptoms worsen or if the condition fails to improve as anticipated.     Mackie Pai, PA-C    Review of Systems  Constitutional: Positive for fatigue and fever. Negative for chills.       Low level fever for 2 days before breaking per pt.  HENT: Positive for congestion and sore throat. Negative for ear pain, sinus pressure and sinus pain.   Respiratory: Positive for cough. Negative for chest tightness, shortness of breath and wheezing.    Cardiovascular: Negative for chest pain and palpitations.  Gastrointestinal: Negative for abdominal pain.  Genitourinary: Positive for difficulty urinating, dysuria and frequency. Negative for decreased urine volume, penile swelling, testicular pain and urgency.  Musculoskeletal: Negative for back pain.  Neurological: Negative for dizziness, speech difficulty, weakness, numbness and headaches.  Hematological: Negative for adenopathy. Does not bruise/bleed easily.  Psychiatric/Behavioral: Negative for confusion and dysphoric mood.    Past Medical History:  Diagnosis Date  . Bronchitis   . Diverticulosis   . FH: BPH (benign prostatic hypertrophy)   . History of Graves' disease 01/19/2018   S/p ablation  . HTN (hypertension)   . Hyperlipidemia   . Obesity   . Psoriatic arthritis ([place])   . Rheumatoid arthritis ([place]) 08/07/2014  . Sleep apnea    CPAP  . Thyroid disease      Social History   Socioeconomic History  . Marital status: Married    Spouse name: Not on file  . Number of children: 2  . Years of education: Not on file  . Highest education level: Not on file  Occupational History  . Occupation: Retired    Fish farm manager: [employer]  Tobacco Use  . Smoking status: Former Smoker    Types: Cigarettes  . Smokeless tobacco: Never Used  . Tobacco comment: Quit 46 years ago.  Vaping Use  . Vaping Use: Never used  Substance and Sexual Activity  . Alcohol use: No  . Drug use: No  . Sexual activity: Not on file  Other Topics Concern  . Not on file  Social History Narrative   Lives with wife.   Social Determinants of Health   Financial Resource Strain: Not on file  Food Insecurity: Not on file  Transportation Needs: Not on file  Physical Activity: Not on file  Stress: Not on file  Social Connections: Not on file  Intimate Partner Violence: Not on file    Past Surgical History:  Procedure Laterality Date  . INGUINAL HERNIA REPAIR     bilateral  . KNEE SURGERY      bilateral arthroscopic  . PACEMAKER IMPLANT N/A 12/13/2019   Procedure: PACEMAKER IMPLANT;  Surgeon: Evans Lance, MD;  Location: [facility];  Service: Cardiovascular;  Laterality: N/A;  . TRANSURETHRAL RESECTION OF PROSTATE    . UMBILICAL HERNIA REPAIR      Family History  Problem Relation Age of Onset  . CAD Father 11       Died age 28  . CAD Mother 21       CABG  . Diabetes Brother     Allergies  Allergen Reactions  . Codeine     Tension, nasty feeling   . Nsaids Other (See Comments)    Renal insufficiency  . Prednisone Other (See Comments)    Unknown/ sometimes takes with lower dose  . Statins Other (See Comments)    Joint pain   . Sulfa Antibiotics Other (See Comments)    Joint pain  Current Outpatient Medications on File Prior to Visit  Medication Sig Dispense Refill  . acetaminophen (TYLENOL) 325 MG tablet Take 650 mg by mouth as needed for mild pain.     . Alirocumab (PRALUENT) 150 MG/ML SOAJ Inject 150 mg into the skin every 14 (fourteen) days. 6 pen 3  . Ascorbic Acid (VITAMIN C ADULT GUMMIES PO) Take by mouth. 1 gummies daily    . Chlorpheniramine Maleate (ALLERGY RELIEF PO) Take 1 tablet by mouth as needed.    . Cholecalciferol (VITAMIN D) 125 MCG (5000 UT) CAPS Take 5,000 Units by mouth daily.     . Cyanocobalamin (VITAMIN B 12) 500 MCG TABS Take 1,000 mcg by mouth daily.     Marland Kitchen dofetilide (TIKOSYN) 250 MCG capsule TAKE ONE CAPSULE BY MOUTH TWICE A DAY 180 capsule 2  . ELIQUIS 5 MG TABS tablet TAKE 1 TABLET BY MOUTH TWICE A DAY 180 tablet 3  . etanercept (ENBREL) 50 MG/ML injection Inject 50 mg into the skin once a week.    . fluticasone (FLONASE) 50 MCG/ACT nasal spray Place 1 spray into both nostrils as needed for allergies.     Marland Kitchen FOLIC ACID PO Take by mouth.    . levothyroxine (SYNTHROID) 125 MCG tablet Take 1 tablet (125 mcg total) by mouth daily before breakfast. 90 tablet 2  . losartan (COZAAR) 50 MG tablet Take 1 tablet (50 mg total) by  mouth in the morning and at bedtime. 180 tablet 3  . Melatonin 5 MG TABS Take 5 mg by mouth at bedtime.    Marland Kitchen omeprazole (PRILOSEC) 20 MG capsule Take 20 mg by mouth daily. Buys the [employer] brand.    . predniSONE (DELTASONE) 5 MG tablet Take by mouth as needed.    . tadalafil (CIALIS) 20 MG tablet Take 20 mg by mouth daily as needed.    . tamsulosin (FLOMAX) 0.4 MG CAPS capsule Take 0.4 mg by mouth at bedtime.     . triamcinolone cream (KENALOG) 0.1 % Apply 1 application topically as needed for itching.     . zinc gluconate 50 MG tablet Take 50 mg by mouth at bedtime.     No current facility-administered medications on file prior to visit.    BP 127/62   Pulse 60   Temp (!) 97 F (36.1 C) (Oral)       Objective:   Physical Exam        Assessment & Plan:

## 2021-01-31 NOTE — Addendum Note (Signed)
Addended by: Kelle Darting A on: 01/31/2021 04:23 PM   Modules accepted: Orders

## 2021-02-01 LAB — URINE CULTURE
MICRO NUMBER:: 11580328
Result:: NO GROWTH
SPECIMEN QUALITY:: ADEQUATE

## 2021-02-03 ENCOUNTER — Telehealth: Payer: Medicare Other | Admitting: Internal Medicine

## 2021-02-04 ENCOUNTER — Telehealth: Payer: Self-pay | Admitting: Internal Medicine

## 2021-02-04 NOTE — Telephone Encounter (Signed)
Pt called and lvm to return call 

## 2021-02-04 NOTE — Telephone Encounter (Signed)
Looks like he saw Percell Miller for this.

## 2021-02-04 NOTE — Telephone Encounter (Signed)
Patient states he is doing better, he want  to know when will he be able to go out in public  He also  has an appointment with the cardiologist on the 7th  Please advice

## 2021-02-04 NOTE — Telephone Encounter (Signed)
Pt had urinary symptoms for which treated with cipro. He also had respiratory symptoms. It has been 6 days already. We discussed him testing for covid? Did he ever test? If he did not test and respiratory symptoms resolved then no limitations. If he tested positive. What day? Probably did not get positive since he did not notify us. Let me know what he says.

## 2021-02-05 NOTE — Telephone Encounter (Signed)
Ok sounds like he is better then.

## 2021-02-05 NOTE — Telephone Encounter (Signed)
Patient notified . , told pt to call when we got off the phone and he stated he doesn't want antibody meds   Wife was in the background stating " he is donig better and doesn't need to see the covid clinci"

## 2021-02-05 NOTE — Telephone Encounter (Signed)
I had wanted update on positive test last week. Just now finding out. With his age and risk score/med history he may benefit from antibodies or antiviral. Sounds like minimal symptoms. Want him to call the covid clnic tomorrow morning. [phone]. They can listen to his lungs. Evaluate and see if further treatment needed. Want him to be seen tomorrow or Friday.

## 2021-02-05 NOTE — Telephone Encounter (Signed)
Tested positive on 2/24 .  Cipro helped  Fever broke on 2/24 as well. Just minor dry cough , wants to know next steps

## 2021-02-06 ENCOUNTER — Telehealth: Payer: Self-pay | Admitting: Cardiology

## 2021-02-06 ENCOUNTER — Telehealth (INDEPENDENT_AMBULATORY_CARE_PROVIDER_SITE_OTHER): Payer: Medicare Other | Admitting: Nurse Practitioner

## 2021-02-06 DIAGNOSIS — Z8616 Personal history of COVID-19: Secondary | ICD-10-CM | POA: Diagnosis not present

## 2021-02-06 NOTE — Progress Notes (Addendum)
Virtual Visit via Telephone Note  I connected with Martin Rubio on 02/10/21 at  9:30 AM EST by telephone and verified that I am speaking with the correct person using two identifiers.  Location: Patient: home Provider: office   I discussed the limitations, risks, security and privacy concerns of performing an evaluation and management service by telephone and the availability of in person appointments. I also discussed with the patient that there may be a patient responsible charge related to this service. The patient expressed understanding and agreed to proceed.   History of Present Illness:   Patient presents for post Covid care center visit. Patient tested positive on 01/31/21. Symptoms started 01/29/21. He states that he is doing better.  He states that his fever has subsided and cough is improving.  We discussed ending quarantine. Denies f/c/s, n/v/d, hemoptysis, PND, leg swelling Denies chest pain or edema.        Observations/Objective:  Vitals with BMI 02/10/2021 01/31/2021 01/08/2021  Height 5\' 10"  - 5\' 10"   Weight 195 lbs 6 oz - 201 lbs  BMI 46.80 - 32.12  Systolic 248 250 037  Diastolic 62 62 70  Pulse 62 60 87     Assessment and Plan:  Covid 19:   Stay well hydrated  Stay active  Deep breathing exercises   May take tylenol or fever or pain    Follow up:  Follow up if needed       I discussed the assessment and treatment plan with the patient. The patient was provided an opportunity to ask questions and all were answered. The patient agreed with the plan and demonstrated an understanding of the instructions.   The patient was advised to call back or seek an in-person evaluation if the symptoms worsen or if the condition fails to improve as anticipated.  I provided 22 minutes of non-face-to-face time during this encounter.   Fenton Foy, NP

## 2021-02-06 NOTE — Telephone Encounter (Signed)
    COVID-19 Pre-Screening Questions V2.:  . In the past 7 to 10 days have you had a cough,  shortness of breath, headache, congestion, fever (100 or greater) body aches, chills, sore throat, or sudden loss of taste or sense of smell,? . Have you been around anyone with known Covid 19, or who is waiting for a Covid test result and is symptomatic?  FYI--Patient states on 01/29/21 he developed a temperature of 99-100. He states he was also experiencing UTI symptoms, so he assumed he had a UTI, but he was seen and advised he did not have a UTI. He states the "UTI symptoms" went away and he developed upper respiratory symptoms. He tested for COVID and confirmed positive on 01/31/21. He states he has quarantined since testing positive and now only has a mild dry cough. He is scheduled to see Dr. [name] on 02/10/21 at 11:20 AM. He plans to keep this appointment as is unless the cough persists. He states if anything changes he will notify us and reschedule.   For patients who are Covid+, pending results or exposed in the last 5 days WITH SYMPTOMS:  1.       Offer to change appointment to a [facility] appointment. If the patient declines, contact covering nurse or triage so it can be determined if patient needs to be seen or     rescheduled. (assumes patient calls ahead)  2.       If a patient presents in the lobby, ask them to return to their car and the nurse will call them. Obtain the correct cell phone number and message the covering nurse. The nurse will triage the patient and discuss with the provider to determine appropriate visit plan (In office, MyChart or reschedule).   3.       If it is determined the patient needs to be seen in the office, arrangements will be made for the patient to be seen in a remote room with minimal touches. (Location will be         specific to each [facility] site).               If you have any concerns/questions about symptoms patients report during screening  (either on the phone or at threshold),                Send a secure chat with the patient's chart to the APP doing preop clearances for that day.              If the decision is made to see the patient, document the following in the appt. note:  "cleared by APP's initials".                        If an APP is not available contact a member of the leadership team.   Patients who are Covid+ are allowed in the office when the following are met: 1. No symptoms or improving symptoms  2. At least 5 days post positive test

## 2021-02-07 NOTE — Patient Instructions (Signed)
Covid 19:   Stay well hydrated  Stay active  Deep breathing exercises   May take tylenol or fever or pain    Follow up:  Follow up if needed

## 2021-02-09 DIAGNOSIS — R0602 Shortness of breath: Secondary | ICD-10-CM | POA: Insufficient documentation

## 2021-02-09 DIAGNOSIS — R0989 Other specified symptoms and signs involving the circulatory and respiratory systems: Secondary | ICD-10-CM | POA: Insufficient documentation

## 2021-02-09 DIAGNOSIS — I358 Other nonrheumatic aortic valve disorders: Secondary | ICD-10-CM | POA: Insufficient documentation

## 2021-02-09 NOTE — Progress Notes (Unsigned)
Cardiology Office Note   Date:  02/10/2021   ID:  Martin Rubio, DOB 02-21-1946, MRN [id number]  PCP:  Colon Branch, MD  Cardiologist:   Minus Breeding, MD   Chief Complaint  Patient presents with  . Shortness of Breath      History of Present Illness: Martin Rubio is a 75 y.o. male who presents for follow up of HTN.  His last echo in Nov of 2016 demonstrated no septal hypertrophy.  He has had atrial fib/flutter paroxysmally with slow ventricular rates.  He had pacemaker placement.   He continued to have PAF and was started on Tikosyn.  He was most recently seen via remote visit by [facility].    Since I last saw him he has lost about 25 pounds trying to eat better.  He said he still short of breath climbing up 7-8 stairs.  He can only do 5 minutes on the elliptical.  However, he does not exercise routinely.  His wife is with him today and says he is very sedentary.  He does report that his resting O2 sats are in the high 90s but he does not check it when he short of breath or when he is being active.  He denies any chest pressure, neck or arm discomfort  He has fleeting is not substernal radiating to his neck.  He is not describing PND or orthopnea.  He has occasional palpitations but no presyncope or syncope.  Of note he does report that his breathing is slightly better since he lost the weight.  Past Medical History:  Diagnosis Date  . Bronchitis   . Diverticulosis   . FH: BPH (benign prostatic hypertrophy)   . History of Graves' disease 01/19/2018   S/p ablation  . HTN (hypertension)   . Hyperlipidemia   . Obesity   . Psoriatic arthritis ([place])   . Rheumatoid arthritis ([place]) 08/07/2014  . Sleep apnea    CPAP  . Thyroid disease     Past Surgical History:  Procedure Laterality Date  . INGUINAL HERNIA REPAIR     bilateral  . KNEE SURGERY     bilateral arthroscopic  . PACEMAKER IMPLANT N/A 12/13/2019   Procedure: PACEMAKER IMPLANT;  Surgeon: Evans Lance, MD;  Location: [facility];  Service: Cardiovascular;  Laterality: N/A;  . TRANSURETHRAL RESECTION OF PROSTATE    . UMBILICAL HERNIA REPAIR       Current Outpatient Medications  Medication Sig Dispense Refill  . acetaminophen (TYLENOL) 325 MG tablet Take 650 mg by mouth as needed for mild pain.     . Ascorbic Acid (VITAMIN C ADULT GUMMIES PO) Take by mouth. 1 gummies daily    . benzonatate (TESSALON) 100 MG capsule Take 1 capsule (100 mg total) by mouth 3 (three) times daily as needed for cough. 30 capsule 0  . Chlorpheniramine Maleate (ALLERGY RELIEF PO) Take 1 tablet by mouth as needed.    . Cholecalciferol (VITAMIN D) 125 MCG (5000 UT) CAPS Take 5,000 Units by mouth daily.     . ciprofloxacin (CIPRO) 500 MG tablet Take 1 tablet (500 mg total) by mouth 2 (two) times daily. 20 tablet 0  . Cyanocobalamin (VITAMIN B 12) 500 MCG TABS Take 1,000 mcg by mouth daily.     Marland Kitchen dofetilide (TIKOSYN) 250 MCG capsule TAKE ONE CAPSULE BY MOUTH TWICE A DAY 180 capsule 2  . ELIQUIS 5 MG TABS tablet TAKE 1 TABLET BY MOUTH  TWICE A DAY 180 tablet 3  . etanercept (ENBREL) 50 MG/ML injection Inject 50 mg into the skin once a week.    . fluticasone (FLONASE) 50 MCG/ACT nasal spray Place 1 spray into both nostrils as needed for allergies.     Marland Kitchen FOLIC ACID PO Take by mouth.    . levothyroxine (SYNTHROID) 125 MCG tablet Take 1 tablet (125 mcg total) by mouth daily before breakfast. 90 tablet 2  . losartan (COZAAR) 50 MG tablet Take 1 tablet (50 mg total) by mouth in the morning and at bedtime. 180 tablet 3  . meclizine (ANTIVERT) 25 MG tablet Take 1 tablet by mouth 3 (three) times daily as needed.    . Melatonin 5 MG TABS Take 5 mg by mouth at bedtime.    Marland Kitchen omeprazole (PRILOSEC) 20 MG capsule Take 20 mg by mouth daily. Buys the [employer] brand.    . predniSONE (DELTASONE) 5 MG tablet Take by mouth as needed.    . tadalafil (CIALIS) 20 MG tablet Take 20 mg by mouth daily as needed.    . tamsulosin (FLOMAX)  0.4 MG CAPS capsule Take 0.4 mg by mouth at bedtime.     . triamcinolone cream (KENALOG) 0.1 % Apply 1 application topically as needed for itching.     . zinc gluconate 50 MG tablet Take 50 mg by mouth at bedtime.    . Alirocumab (PRALUENT) 150 MG/ML SOAJ Inject 150 mg into the skin every 14 (fourteen) days. (Patient not taking: Reported on 02/10/2021) 6 pen 3   No current facility-administered medications for this visit.    Allergies:   Codeine, Nsaids, Prednisone, Statins, and Sulfa antibiotics    ROS:  Please see the history of present illness.   Otherwise, review of systems are positive for none.   All other systems are reviewed and negative.    PHYSICAL EXAM: VS:  BP 110/62   Pulse 62   Ht 5\' 10"  (1.778 m)   Wt 195 lb 6.4 oz (88.6 kg)   SpO2 99%   BMI 28.04 kg/m  , BMI Body mass index is 28.04 kg/m. GENERAL:  Well appearing NECK:  No jugular venous distention, waveform within normal limits, carotid upstroke brisk and symmetric, possible bilateral carotid bruits, no thyromegaly LUNGS:  Clear to auscultation bilaterally CHEST:  Well healed pacemaker pocket.   HEART:  PMI not displaced or sustained,S1 and S2 within normal limits, no S3, no S4, no clicks, no rubs, no murmurs ABD:  Flat, positive bowel sounds normal in frequency in pitch, no bruits, no rebound, no guarding, no midline pulsatile mass, no hepatomegaly, no splenomegaly EXT:  2 plus pulses throughout, no edema, no cyanosis no clubbing   EKG:  EKG is  ordered today. Atrial paced rhythm rate 62, first-degree AV block, ventricular tracking, left axis deviation, voltage criteria for left ventricular hypertrophy, no acute ST-T wave changes.  Recent Labs: 10/18/2020: Hemoglobin 12.2; Platelets 190 11/11/2020: BNP 95.4 12/10/2020: TSH 0.66 01/08/2021: BUN 16; Creatinine, Ser 1.51; Magnesium 1.9; Potassium 4.3; Sodium 139    Lipid Panel    Component Value Date/Time   CHOL 102 12/10/2020 1051   CHOL 142 01/30/2020 1018    TRIG 137.0 12/10/2020 1051   HDL 37.80 (L) 12/10/2020 1051   HDL 40 01/30/2020 1018   CHOLHDL 3 12/10/2020 1051   VLDL 27.4 12/10/2020 1051   LDLCALC 36 12/10/2020 1051   LDLCALC 72 01/30/2020 1018      Wt Readings from Last 3 Encounters:  02/10/21 195 lb 6.4 oz (88.6 kg)  01/08/21 201 lb (91.2 kg)  12/10/20 205 lb 4 oz (93.1 kg)      Other studies Reviewed: Additional studies/ records that were reviewed today include:  Labs Review of the above records demonstrates:  Please see elsewhere in the note.     ASSESSMENT AND PLAN:  Atrial fib His PF burden was 0.4% in Jan 2022.   No change in therapy.  Pacemaker placement He is up-to-date with follow-up as above.   I did review the device interrogation for this appointment.  Left common iliac anuerysm He was stable on follow up with Dr. Fletcher Anon.  Plans for therapy would be 3 cm or growth greater than 1 cm per year.  He is to have follow up again in Nov.  If he has not had this scheduled when I see him next I will schedule this.  HTN Blood pressure is at target.  No change in therapy.   Murmur He has aortic sclerosis on echo in January 2021.  No change in therapy.    Dyslipidemia He is being managed he is being managed with a PCSK9 inhibitor.  LDL was 36.   Dyspnea: He had a negative perfusion study.  He has had a negative pulmonary work-up.  Of note his breathing is slightly better since he has had weight loss.  We had a long discussion today then about increased physical activity.  If he does not have improvement as he slowly increase his physical activity and he might consider cardiopulmonary stress testing    Current medicines are reviewed at length with the patient today.  The patient does not have concerns regarding medicines.  The following changes have been made:  None  Labs/ tests ordered today include:   None  Orders Placed This Encounter  Procedures  . EKG 12-Lead     Disposition:   FU with me in 6  months   Signed, Minus Breeding, MD  02/10/2021 12:53 PM    [facility]

## 2021-02-10 ENCOUNTER — Encounter: Payer: Self-pay | Admitting: Cardiology

## 2021-02-10 ENCOUNTER — Ambulatory Visit (INDEPENDENT_AMBULATORY_CARE_PROVIDER_SITE_OTHER): Payer: Medicare Other | Admitting: Cardiology

## 2021-02-10 VITALS — BP 110/62 | HR 62 | Ht 70.0 in | Wt 195.4 lb

## 2021-02-10 DIAGNOSIS — E785 Hyperlipidemia, unspecified: Secondary | ICD-10-CM | POA: Diagnosis not present

## 2021-02-10 DIAGNOSIS — Z950 Presence of cardiac pacemaker: Secondary | ICD-10-CM | POA: Diagnosis not present

## 2021-02-10 DIAGNOSIS — I480 Paroxysmal atrial fibrillation: Secondary | ICD-10-CM

## 2021-02-10 DIAGNOSIS — Z8616 Personal history of COVID-19: Secondary | ICD-10-CM | POA: Insufficient documentation

## 2021-02-10 DIAGNOSIS — I723 Aneurysm of iliac artery: Secondary | ICD-10-CM

## 2021-02-10 DIAGNOSIS — R0989 Other specified symptoms and signs involving the circulatory and respiratory systems: Secondary | ICD-10-CM

## 2021-02-10 DIAGNOSIS — I358 Other nonrheumatic aortic valve disorders: Secondary | ICD-10-CM

## 2021-02-10 DIAGNOSIS — R0602 Shortness of breath: Secondary | ICD-10-CM

## 2021-02-10 DIAGNOSIS — I10 Essential (primary) hypertension: Secondary | ICD-10-CM | POA: Diagnosis not present

## 2021-02-10 NOTE — Patient Instructions (Signed)
Medication Instructions:  No changes *If you need a refill on your cardiac medications before your next appointment, please call your pharmacy*  Follow-Up: At [facility], you and your health needs are our priority.  As part of our continuing mission to provide you with exceptional heart care, we have created designated Provider Care Teams.  These Care Teams include your primary Cardiologist (physician) and Advanced Practice Providers (APPs -  Physician Assistants and Nurse Practitioners) who all work together to provide you with the care you need, when you need it.  MyChart is used to connect with patients for Virtual Visits (Telemedicine).  Patients are able to view lab/test results, encounter notes, upcoming appointments, etc.  Non-urgent messages can be sent to your provider as well.    Your next appointment:   6 month(s) You will receive a reminder letter in the mail two months in advance. If you don't receive a letter, please call our office to schedule the follow-up appointment.  The format for your next appointment:   In Person  Provider:   Minus Breeding, MD

## 2021-02-11 ENCOUNTER — Encounter: Payer: Self-pay | Admitting: Internal Medicine

## 2021-02-13 ENCOUNTER — Other Ambulatory Visit: Payer: Self-pay | Admitting: Medical

## 2021-02-13 ENCOUNTER — Other Ambulatory Visit: Payer: Self-pay | Admitting: Cardiology

## 2021-02-25 DIAGNOSIS — M545 Low back pain, unspecified: Secondary | ICD-10-CM | POA: Diagnosis not present

## 2021-02-25 DIAGNOSIS — L4050 Arthropathic psoriasis, unspecified: Secondary | ICD-10-CM | POA: Diagnosis not present

## 2021-02-25 DIAGNOSIS — Z7185 Encounter for immunization safety counseling: Secondary | ICD-10-CM | POA: Diagnosis not present

## 2021-02-25 DIAGNOSIS — Z6827 Body mass index (BMI) 27.0-27.9, adult: Secondary | ICD-10-CM | POA: Diagnosis not present

## 2021-02-25 DIAGNOSIS — E663 Overweight: Secondary | ICD-10-CM | POA: Diagnosis not present

## 2021-02-25 DIAGNOSIS — M79671 Pain in right foot: Secondary | ICD-10-CM | POA: Diagnosis not present

## 2021-02-25 DIAGNOSIS — L409 Psoriasis, unspecified: Secondary | ICD-10-CM | POA: Diagnosis not present

## 2021-02-25 DIAGNOSIS — M5136 Other intervertebral disc degeneration, lumbar region: Secondary | ICD-10-CM | POA: Diagnosis not present

## 2021-02-25 DIAGNOSIS — M150 Primary generalized (osteo)arthritis: Secondary | ICD-10-CM | POA: Diagnosis not present

## 2021-03-13 ENCOUNTER — Ambulatory Visit (INDEPENDENT_AMBULATORY_CARE_PROVIDER_SITE_OTHER): Payer: Medicare Other

## 2021-03-13 DIAGNOSIS — I422 Other hypertrophic cardiomyopathy: Secondary | ICD-10-CM

## 2021-03-13 LAB — CUP PACEART REMOTE DEVICE CHECK
Battery Remaining Longevity: 138 mo
Battery Voltage: 3.04 V
Brady Statistic AP VP Percent: 15.78 %
Brady Statistic AP VS Percent: 47.08 %
Brady Statistic AS VP Percent: 0.76 %
Brady Statistic AS VS Percent: 36.38 %
Brady Statistic RA Percent Paced: 62.35 %
Brady Statistic RV Percent Paced: 16.78 %
Date Time Interrogation Session: 20220406211404
Implantable Lead Implant Date: 20210106
Implantable Lead Implant Date: 20210106
Implantable Lead Location: 753859
Implantable Lead Location: 753860
Implantable Lead Model: 3830
Implantable Lead Model: 5076
Implantable Pulse Generator Implant Date: 20210106
Lead Channel Impedance Value: 323 Ohm
Lead Channel Impedance Value: 342 Ohm
Lead Channel Impedance Value: 361 Ohm
Lead Channel Impedance Value: 532 Ohm
Lead Channel Pacing Threshold Amplitude: 0.5 V
Lead Channel Pacing Threshold Amplitude: 0.625 V
Lead Channel Pacing Threshold Pulse Width: 0.4 ms
Lead Channel Pacing Threshold Pulse Width: 0.4 ms
Lead Channel Sensing Intrinsic Amplitude: 2.5 mV
Lead Channel Sensing Intrinsic Amplitude: 2.5 mV
Lead Channel Sensing Intrinsic Amplitude: 29 mV
Lead Channel Sensing Intrinsic Amplitude: 29 mV
Lead Channel Setting Pacing Amplitude: 1.5 V
Lead Channel Setting Pacing Amplitude: 2.5 V
Lead Channel Setting Pacing Pulse Width: 0.4 ms
Lead Channel Setting Sensing Sensitivity: 2 mV

## 2021-03-17 ENCOUNTER — Ambulatory Visit (INDEPENDENT_AMBULATORY_CARE_PROVIDER_SITE_OTHER): Payer: Medicare Other | Admitting: Podiatry

## 2021-03-17 ENCOUNTER — Other Ambulatory Visit: Payer: Self-pay

## 2021-03-17 DIAGNOSIS — M79672 Pain in left foot: Secondary | ICD-10-CM

## 2021-03-17 DIAGNOSIS — M898X7 Other specified disorders of bone, ankle and foot: Secondary | ICD-10-CM | POA: Diagnosis not present

## 2021-03-17 NOTE — Progress Notes (Signed)
   HPI: 75 y.o. male presenting today for follow-up evaluation of an exostosis to the left foot.  Patient states he has had the nodule for his whole life.  He was last seen in the office on 10/11/2019 at which time surgery was scheduled for exostectomy.  Since then he has had a pacemaker placed and was unable to proceed with surgery.  He is ready for surgery now and presents for further treatment and evaluation.   Past Medical History:  Diagnosis Date  . Bronchitis   . Diverticulosis   . FH: BPH (benign prostatic hypertrophy)   . History of Graves' disease 01/19/2018   S/p ablation  . HTN (hypertension)   . Hyperlipidemia   . Obesity   . Psoriatic arthritis ([place])   . Rheumatoid arthritis ([place]) 08/07/2014  . Sleep apnea    CPAP  . Thyroid disease      Physical Exam: General: The patient is alert and oriented x3 in no acute distress.  Dermatology: Hyperkeratotic, discolored, thickened, onychodystrophy of the left great toenail. Skin is warm, dry and supple bilateral lower extremities. Negative for open lesions or macerations.  Vascular: Palpable pedal pulses bilaterally. No edema or erythema noted. Capillary refill within normal limits.  Neurological: Epicritic and protective threshold grossly intact bilaterally.   Musculoskeletal Exam: Exostosis of the 1st met-cuneiform noted of the left foot. Range of motion within normal limits to all pedal and ankle joints bilateral. Muscle strength 5/5 in all groups bilateral.   Assessment: 1. 1st met-cuneiform exostosis left 2.  Pain in left foot   Plan of Care:  1. Patient evaluated. X-Rays reviewed that were taken last visit.  2. Today we again discussed the conservative versus surgical management of the presenting pathology. The patient opts for surgical management. All possible complications and details of the procedure were explained. All patient questions were answered. No guarantees were expressed or implied. 3. Authorization for  surgery was initiated today. Surgery will consist of 1st met-cuneiform exostectomy left.  4.  Patient will need cardiac clearance prior to surgery  5.  Return to clinic one week post op.       Edrick Kins, DPM [facility]  Dr. Edrick Kins, DPM    [address], [place] [address]                Office [phone]  Fax [phone]

## 2021-03-18 ENCOUNTER — Telehealth: Payer: Self-pay | Admitting: Cardiology

## 2021-03-18 DIAGNOSIS — I700 Atherosclerosis of aorta: Secondary | ICD-10-CM

## 2021-03-18 NOTE — Telephone Encounter (Signed)
Please comment on Eliquis. Thanks! 

## 2021-03-18 NOTE — Telephone Encounter (Signed)
   [facility] Pre-operative Risk Assessment    Patient Name: Martin Rubio   [facility] STAFF: - Please ensure there is not already an duplicate clearance open for this procedure. - Under Visit Info/Reason for Call, type in Other and utilize the format Clearance MM/DD/YY or Clearance TBD. Do not use dashes or single digits. - If request is for dental extraction, please clarify the # of teeth to be extracted.  Request for surgical clearance:  1. What type of surgery is being performed? Tarsal exostectomy    2. When is this surgery scheduled? 03/27/2021   3. What type of clearance is required (medical clearance vs. Pharmacy clearance to hold med vs. Both)? Both  4. Are there any medications that need to be held prior to surgery and how long? Leaving up to cardiology  5. Practice name and name of physician performing surgery? [facility], Dr. Vivien Rossetti  6. What is the office phone number? [phone]   7.   What is the office fax number? [phone]  8.   Anesthesia type (None, local, MAC, general) ? choice   Selena Zobro 03/18/2021, 11:03 AM  _________________________________________________________________   (provider comments below)

## 2021-03-18 NOTE — Telephone Encounter (Addendum)
Patient with diagnosis of atrial fibrillation on Eliquis for anticoagulation.    Procedure: Tarsal exostectomy   Date of procedure: 03/27/21  CHA2DS2-VASc Score = 4  This indicates a 4.8% annual risk of stroke. The patient's score is based upon: CHF History: No HTN History: Yes Diabetes History: No Stroke History: No Vascular Disease History: Yes Age Score: 2 Gender Score: 0   CrCl 52 ml/min Platelet count 190K  Per office protocol, patient can hold Eliquis for 1-2 days prior to procedure.

## 2021-03-19 NOTE — Telephone Encounter (Signed)
Left VM

## 2021-03-21 ENCOUNTER — Encounter: Payer: Self-pay | Admitting: Podiatry

## 2021-03-27 ENCOUNTER — Other Ambulatory Visit: Payer: Self-pay | Admitting: Podiatry

## 2021-03-27 ENCOUNTER — Encounter: Payer: Self-pay | Admitting: Podiatry

## 2021-03-27 DIAGNOSIS — M25775 Osteophyte, left foot: Secondary | ICD-10-CM | POA: Diagnosis not present

## 2021-03-27 DIAGNOSIS — M898X7 Other specified disorders of bone, ankle and foot: Secondary | ICD-10-CM | POA: Diagnosis not present

## 2021-03-27 MED ORDER — OXYCODONE-ACETAMINOPHEN 5-325 MG PO TABS: 1.0000 | ORAL_TABLET | ORAL | 0 refills | Status: DC | PRN

## 2021-03-27 NOTE — Progress Notes (Signed)
Remote pacemaker transmission.   

## 2021-03-27 NOTE — Telephone Encounter (Signed)
Please verify with the [facility] to see if the patient already had surgery and if this cardiac clearance is still needed.

## 2021-03-27 NOTE — Progress Notes (Signed)
PRN postop 

## 2021-03-28 ENCOUNTER — Telehealth: Payer: Self-pay | Admitting: Podiatry

## 2021-03-28 ENCOUNTER — Telehealth: Payer: Self-pay

## 2021-03-28 NOTE — Telephone Encounter (Addendum)
Martin Rubio had surgery 03/27/2021, he was given Percocet for the pain. He stated is is making him nauseous, dizzy and having an upset stomach. He would like to know if there is something else he can take or what does he need to do? Please advise

## 2021-03-28 NOTE — Telephone Encounter (Signed)
Pt called and would like to know when he should start taking Eliquis again. He also requested an alternative medication for oxycodone. He states it's giving him nausea. Please advise.

## 2021-03-28 NOTE — Telephone Encounter (Signed)
Spoke with Darrick Penna from requesting office and she stated that patient had surgery yesterday 03/27/21.

## 2021-03-28 NOTE — Telephone Encounter (Signed)
Patient calling to inquire about getting a different medication for pain. Patient stated that oxycodone is making him sick. The patient was advised to "check" his surgical site for swelling and bruising and swelling, but can not removed the dressing. Please advise.

## 2021-04-02 ENCOUNTER — Ambulatory Visit (INDEPENDENT_AMBULATORY_CARE_PROVIDER_SITE_OTHER): Payer: Medicare Other | Admitting: Podiatry

## 2021-04-02 ENCOUNTER — Ambulatory Visit (INDEPENDENT_AMBULATORY_CARE_PROVIDER_SITE_OTHER): Payer: Medicare Other

## 2021-04-02 ENCOUNTER — Other Ambulatory Visit: Payer: Self-pay

## 2021-04-02 DIAGNOSIS — M898X7 Other specified disorders of bone, ankle and foot: Secondary | ICD-10-CM

## 2021-04-02 DIAGNOSIS — M898X9 Other specified disorders of bone, unspecified site: Secondary | ICD-10-CM

## 2021-04-02 DIAGNOSIS — Z9889 Other specified postprocedural states: Secondary | ICD-10-CM

## 2021-04-02 MED ORDER — TRAMADOL HCL 50 MG PO TABS: 50.0000 mg | ORAL_TABLET | ORAL | 0 refills | Status: AC | PRN

## 2021-04-02 NOTE — Progress Notes (Signed)
   Subjective:  Patient presents today status post tarsal exostectomy left foot. DOS: 03/27/2021.  Patient states that he is doing well.  The pain medication made him nauseous.  No new complaints at this time  Past Medical History:  Diagnosis Date  . Bronchitis   . Diverticulosis   . FH: BPH (benign prostatic hypertrophy)   . History of Graves' disease 01/19/2018   S/p ablation  . HTN (hypertension)   . Hyperlipidemia   . Obesity   . Psoriatic arthritis ([place])   . Rheumatoid arthritis ([place]) 08/07/2014  . Sleep apnea    CPAP  . Thyroid disease       Objective/Physical Exam Neurovascular status intact.  Skin incisions appear to be well coapted with  staples intact. No sign of infectious process noted. No dehiscence. No active bleeding noted. Moderate edema noted to the surgical extremity.  Radiographic Exam:  At the time of visit the power went out and x-rays were not obtainable this visit  Assessment: 1. s/p exostectomy left foot. DOS: 03/27/2021   Plan of Care:  1. Patient was evaluated.  2.  Dressings changed today.  Patient may begin washing and showering and getting the foot wet 3.  Recommend antibiotic ointment and a light dressing daily 4.  Continue weightbearing in the postsurgical shoe 5.  Return to clinic in 1 week for x-rays and staple removal   Edrick Kins, DPM [facility]  Dr. Edrick Kins, DPM    [address], [place] [address]                Office [phone]  Fax [phone]

## 2021-04-09 ENCOUNTER — Ambulatory Visit (INDEPENDENT_AMBULATORY_CARE_PROVIDER_SITE_OTHER): Payer: Medicare Other | Admitting: Podiatry

## 2021-04-09 ENCOUNTER — Other Ambulatory Visit: Payer: Self-pay

## 2021-04-09 ENCOUNTER — Ambulatory Visit (INDEPENDENT_AMBULATORY_CARE_PROVIDER_SITE_OTHER): Payer: Medicare Other

## 2021-04-09 DIAGNOSIS — Z9889 Other specified postprocedural states: Secondary | ICD-10-CM

## 2021-04-09 DIAGNOSIS — M898X7 Other specified disorders of bone, ankle and foot: Secondary | ICD-10-CM | POA: Diagnosis not present

## 2021-04-09 MED ORDER — HYDROXYZINE PAMOATE 25 MG PO CAPS: 25.0000 mg | ORAL_CAPSULE | Freq: Three times a day (TID) | ORAL | 0 refills | Status: DC | PRN

## 2021-04-23 ENCOUNTER — Telehealth: Payer: Self-pay | Admitting: *Deleted

## 2021-04-23 ENCOUNTER — Other Ambulatory Visit: Payer: Self-pay

## 2021-04-23 ENCOUNTER — Encounter: Payer: Medicare Other | Admitting: Podiatry

## 2021-04-23 ENCOUNTER — Ambulatory Visit (INDEPENDENT_AMBULATORY_CARE_PROVIDER_SITE_OTHER): Payer: Medicare Other | Admitting: Podiatry

## 2021-04-23 DIAGNOSIS — M898X9 Other specified disorders of bone, unspecified site: Secondary | ICD-10-CM

## 2021-04-23 DIAGNOSIS — M76822 Posterior tibial tendinitis, left leg: Secondary | ICD-10-CM

## 2021-04-23 DIAGNOSIS — Z9889 Other specified postprocedural states: Secondary | ICD-10-CM

## 2021-04-23 DIAGNOSIS — G5792 Unspecified mononeuropathy of left lower limb: Secondary | ICD-10-CM

## 2021-04-23 MED ORDER — GABAPENTIN 100 MG PO CAPS: 100.0000 mg | ORAL_CAPSULE | Freq: Three times a day (TID) | ORAL | 1 refills | Status: DC

## 2021-04-23 MED ORDER — BETAMETHASONE SOD PHOS & ACET 6 (3-3) MG/ML IJ SUSP
3.0000 mg | Freq: Once | INTRAMUSCULAR | Status: AC
  Administered 2021-04-23: 3 mg via INTRA_ARTICULAR

## 2021-04-23 MED ORDER — HYDROXYZINE PAMOATE 25 MG PO CAPS: 25.0000 mg | ORAL_CAPSULE | Freq: Three times a day (TID) | ORAL | 0 refills | Status: DC | PRN

## 2021-04-23 NOTE — Progress Notes (Signed)
   Subjective:  Patient presents today status post tarsal exostectomy left foot. DOS: 03/27/2021.  Patient states that his left great toe is numb with sharp stabbing sensations intermittently throughout the day. Patient also states that he has a new complaint regarding pain and tenderness to the inside of the left foot.  This began when he began weightbearing and walking.  He was it may have been aggravated by the boot he was wearing.  He is not anything for treatment other than massage.  Past Medical History:  Diagnosis Date  . Bronchitis   . Diverticulosis   . FH: BPH (benign prostatic hypertrophy)   . History of Graves' disease 01/19/2018   S/p ablation  . HTN (hypertension)   . Hyperlipidemia   . Obesity   . Psoriatic arthritis ([place])   . Rheumatoid arthritis ([place]) 08/07/2014  . Sleep apnea    CPAP  . Thyroid disease       Objective/Physical Exam Neurovascular status intact.  Skin incisions appear to be well coapted with  staples intact. No sign of infectious process noted. No dehiscence. No active bleeding noted.  There is some neuritis with paresthesia noted to the left great toe Today there is also some pain on palpation along the posterior tibial tendon as it inserts onto the navicular tuberosity consistent with findings of an insertional tendinitis   Assessment: 1. s/p exostectomy left foot. DOS: 03/27/2021 2.  Neuritis left great toe 3.  Posterior tibial tendinitis, insertional left   Plan of Care:  1. Patient was evaluated.  2.  Injection of 0.5 cc Celestone Soluspan injected along the posterior tibial tendon sheath left as it inserts onto the navicular tuberosity 3.  Prescription for gabapentin 100 mg 3 times daily for the neuritis 4.  Patient has meloxicam at home.  Recommend meloxicam 15 mg daily 5.  Continue wearing good supportive stability sneakers 6.  Appointment with Pedorthist for custom molded orthotics  7.  Return to clinic next scheduled appointment  Edrick Kins, DPM [facility]  Dr. Edrick Kins, DPM    [address], [place] [address]                Office [phone]  Fax [phone]

## 2021-04-23 NOTE — Telephone Encounter (Signed)
Patient is calling to get clarification on medications prescribed and dosages taken daily.  Returned call and spoke w/ the patient gave information per epic notes. He verbalized understanding.

## 2021-04-24 NOTE — Progress Notes (Signed)
   Subjective:  Patient presents today status post tarsal exostectomy left foot. DOS: 03/27/2021.  Patient states that he is doing well.  The pain medication made him nauseous.  No new complaints at this time  Past Medical History:  Diagnosis Date  . Bronchitis   . Diverticulosis   . FH: BPH (benign prostatic hypertrophy)   . History of Graves' disease 01/19/2018   S/p ablation  . HTN (hypertension)   . Hyperlipidemia   . Obesity   . Psoriatic arthritis ([place])   . Rheumatoid arthritis ([place]) 08/07/2014  . Sleep apnea    CPAP  . Thyroid disease       Objective/Physical Exam Neurovascular status intact.  Skin incisions appear to be well coapted with  staples intact. No sign of infectious process noted. No dehiscence. No active bleeding noted. Moderate edema noted to the surgical extremity.  Radiographic Exam:  Osteotomy of the tarsal bones appears clean margins. Good healing of surgical foot  Assessment: 1. s/p exostectomy left foot. DOS: 03/27/2021   Plan of Care:  1. Patient was evaluated.  2.  Staples removed today 3.  Prescription for Atarax for severe itching of the foot 4.  Discontinue postsurgical shoe 5.  Return to clinic in 4 weeks for final follow-up x-ray  Edrick Kins, DPM [facility]  Dr. Edrick Kins, DPM    [address], [place] [address]                Office [phone]  Fax [phone]

## 2021-04-30 ENCOUNTER — Ambulatory Visit (INDEPENDENT_AMBULATORY_CARE_PROVIDER_SITE_OTHER): Payer: Medicare Other | Admitting: Podiatry

## 2021-04-30 ENCOUNTER — Other Ambulatory Visit: Payer: Self-pay

## 2021-04-30 DIAGNOSIS — M76822 Posterior tibial tendinitis, left leg: Secondary | ICD-10-CM

## 2021-04-30 DIAGNOSIS — M898X7 Other specified disorders of bone, ankle and foot: Secondary | ICD-10-CM

## 2021-04-30 DIAGNOSIS — M7752 Other enthesopathy of left foot: Secondary | ICD-10-CM

## 2021-04-30 DIAGNOSIS — M898X9 Other specified disorders of bone, unspecified site: Secondary | ICD-10-CM

## 2021-04-30 NOTE — Progress Notes (Signed)
Patient presents today to be casted for orthotics.  A foam impression was casted for his right and left foot.  Patient is a size 10  Patient will be contacted when the orthotics are ready to be picked up

## 2021-05-07 ENCOUNTER — Other Ambulatory Visit: Payer: Self-pay

## 2021-05-07 ENCOUNTER — Ambulatory Visit (INDEPENDENT_AMBULATORY_CARE_PROVIDER_SITE_OTHER): Payer: Medicare Other | Admitting: Podiatry

## 2021-05-07 DIAGNOSIS — M7752 Other enthesopathy of left foot: Secondary | ICD-10-CM | POA: Diagnosis not present

## 2021-05-07 DIAGNOSIS — Z9889 Other specified postprocedural states: Secondary | ICD-10-CM

## 2021-05-09 ENCOUNTER — Telehealth: Payer: Self-pay | Admitting: Cardiology

## 2021-05-09 NOTE — Telephone Encounter (Signed)
Returned call to patient left message on personal voice mail Eliquis 5 mg samples left at front desk.Lot # [id number] 5/24.

## 2021-05-09 NOTE — Telephone Encounter (Signed)
Patient calling the office for samples of medication:   1.  What medication and dosage are you requesting samples for? ELIQUIS 5 MG TABS tablet  2.  Are you currently out of this medication?  Patient states he will be out on 05/11/21

## 2021-05-13 ENCOUNTER — Other Ambulatory Visit: Payer: Self-pay | Admitting: Podiatry

## 2021-05-13 ENCOUNTER — Telehealth: Payer: Self-pay | Admitting: Cardiology

## 2021-05-13 DIAGNOSIS — I4891 Unspecified atrial fibrillation: Secondary | ICD-10-CM

## 2021-05-13 MED ORDER — ELIQUIS 5 MG PO TABS: 1.0000 | ORAL_TABLET | Freq: Two times a day (BID) | ORAL | 1 refills | Status: DC

## 2021-05-13 NOTE — Telephone Encounter (Signed)
Please advise 

## 2021-05-13 NOTE — Telephone Encounter (Signed)
*  STAT* If patient is at the pharmacy, call can be transferred to refill team.   1. Which medications need to be refilled? (please list name of each medication and dose if known)  ELIQUIS 5 MG TABS tablet   2. Which pharmacy/location (including street and city if local pharmacy) is medication to be sent to? [id number] - [place], [place] - [address] AT [address]OSS [address]  3. Do they need a 30 day or 90 day supply? 90  Patient states the bottle for the last rx he has says to take 1 tablet daily. If it was changed to take 2 tablets dily he was not aware.  Patient is out of medication

## 2021-05-13 NOTE — Telephone Encounter (Signed)
53M, 88.6KG, SCR1.51 01/08/21, LOVW/HOCHREIN 02/10/21

## 2021-05-13 NOTE — Telephone Encounter (Signed)
LMOM to discuss Eliquis dose. Per chart review all prescriptions and refills sent since 09/2019 are for Apixaban (Eliquis) 5mg  twice daily.

## 2021-05-13 NOTE — Addendum Note (Signed)
Addended by: Allean Found on: 05/13/2021 04:38 PM   Modules accepted: Orders

## 2021-05-18 DIAGNOSIS — M7752 Other enthesopathy of left foot: Secondary | ICD-10-CM | POA: Diagnosis not present

## 2021-05-18 DIAGNOSIS — Z9889 Other specified postprocedural states: Secondary | ICD-10-CM | POA: Diagnosis not present

## 2021-05-18 MED ORDER — BETAMETHASONE SOD PHOS & ACET 6 (3-3) MG/ML IJ SUSP
3.0000 mg | Freq: Once | INTRAMUSCULAR | Status: AC
  Administered 2021-05-18: 3 mg via INTRA_ARTICULAR

## 2021-05-18 NOTE — Progress Notes (Signed)
   Subjective:  Patient presents today status post tarsal exostectomy left foot. DOS: 03/27/2021.  Patient states that his left great toe is numb with sharp stabbing sensations intermittently throughout the day. Patient states that today he is experiencing some left great toe pain.  He denies a history of injury but it is been painful and symptomatic since last visit.  Ambulating aggravates the toe.  He would like to have it evaluated today  Past Medical History:  Diagnosis Date   Bronchitis    Diverticulosis    FH: BPH (benign prostatic hypertrophy)    History of Graves' disease 01/19/2018   S/p ablation   HTN (hypertension)    Hyperlipidemia    Obesity    Psoriatic arthritis ([place])    Rheumatoid arthritis ([place]) 08/07/2014   Sleep apnea    CPAP   Thyroid disease       Objective/Physical Exam Neurovascular status intact.  Skin incisions appear to be well coapted with  staples intact. No sign of infectious process noted. No dehiscence. No active bleeding noted.  There is some neuritis with paresthesia noted to the left great toe Today there is also some pain on palpation and range of motion to the first MTPJ of the left hallux  Assessment: 1. s/p exostectomy left foot. DOS: 03/27/2021 2.  Capsulitis left great toe 3.  Posterior tibial tendinitis, insertional left; resolved   Plan of Care:  1. Patient was evaluated.  2.  Injection of 0.5 cc Celestone Soluspan injected into the first MTPJ left hallux 3.  Continue prescription for gabapentin 100 mg 3 times daily for the neuritis 4.  continue meloxicam 15 mg daily 5.  Continue wearing good supportive stability sneakers 6.  Patient has been molded for orthotics.  Orthotics dispensable pending  7.  Return to clinic 6 weeks  *Going to visit his brother in [place] end of July  Edrick Kins, DPM [facility]  Dr. Edrick Kins, DPM    [address], [place] [address]                 Office [phone]  Fax [phone]

## 2021-05-27 ENCOUNTER — Other Ambulatory Visit: Payer: Self-pay

## 2021-05-27 ENCOUNTER — Ambulatory Visit (INDEPENDENT_AMBULATORY_CARE_PROVIDER_SITE_OTHER): Payer: Medicare Other | Admitting: *Deleted

## 2021-05-27 DIAGNOSIS — M7752 Other enthesopathy of left foot: Secondary | ICD-10-CM

## 2021-05-27 NOTE — Progress Notes (Signed)
Patient presents today to pick up custom molded foot orthotics, diagnosed with capsulitis by Dr. Amalia Hailey.   Orthotics were dispensed and fit was satisfactory. Reviewed instructions for break-in and wear. Written instructions given to patient.  Patient did noticed that the area needing offloading at the forefoot felt like it had more pressure against it. I placed some padding under the plantar forefoot on the under side of the orthotic. He states that it helped and felts more cushioned.  Patient will follow up as needed.   [facility] - order # [id number]

## 2021-06-05 DIAGNOSIS — Z20822 Contact with and (suspected) exposure to covid-19: Secondary | ICD-10-CM | POA: Diagnosis not present

## 2021-06-12 ENCOUNTER — Ambulatory Visit (INDEPENDENT_AMBULATORY_CARE_PROVIDER_SITE_OTHER): Payer: Medicare Other

## 2021-06-12 DIAGNOSIS — I495 Sick sinus syndrome: Secondary | ICD-10-CM | POA: Diagnosis not present

## 2021-06-13 DIAGNOSIS — H16223 Keratoconjunctivitis sicca, not specified as Sjogren's, bilateral: Secondary | ICD-10-CM | POA: Diagnosis not present

## 2021-06-14 LAB — CUP PACEART REMOTE DEVICE CHECK
Battery Remaining Longevity: 135 mo
Battery Voltage: 3.02 V
Brady Statistic AP VP Percent: 17.89 %
Brady Statistic AP VS Percent: 49.16 %
Brady Statistic AS VP Percent: 0.46 %
Brady Statistic AS VS Percent: 32.49 %
Brady Statistic RA Percent Paced: 66.54 %
Brady Statistic RV Percent Paced: 18.55 %
Date Time Interrogation Session: 20220706235022
Implantable Lead Implant Date: 20210106
Implantable Lead Implant Date: 20210106
Implantable Lead Location: 753859
Implantable Lead Location: 753860
Implantable Lead Model: 3830
Implantable Lead Model: 5076
Implantable Pulse Generator Implant Date: 20210106
Lead Channel Impedance Value: 304 Ohm
Lead Channel Impedance Value: 323 Ohm
Lead Channel Impedance Value: 342 Ohm
Lead Channel Impedance Value: 532 Ohm
Lead Channel Pacing Threshold Amplitude: 0.5 V
Lead Channel Pacing Threshold Amplitude: 0.5 V
Lead Channel Pacing Threshold Pulse Width: 0.4 ms
Lead Channel Pacing Threshold Pulse Width: 0.4 ms
Lead Channel Sensing Intrinsic Amplitude: 1.375 mV
Lead Channel Sensing Intrinsic Amplitude: 1.375 mV
Lead Channel Sensing Intrinsic Amplitude: 28.125 mV
Lead Channel Sensing Intrinsic Amplitude: 28.125 mV
Lead Channel Setting Pacing Amplitude: 1.5 V
Lead Channel Setting Pacing Amplitude: 2.5 V
Lead Channel Setting Pacing Pulse Width: 0.4 ms
Lead Channel Setting Sensing Sensitivity: 2 mV

## 2021-06-18 ENCOUNTER — Telehealth: Payer: Self-pay | Admitting: Podiatry

## 2021-06-18 ENCOUNTER — Ambulatory Visit (INDEPENDENT_AMBULATORY_CARE_PROVIDER_SITE_OTHER): Payer: Medicare Other | Admitting: Podiatry

## 2021-06-18 ENCOUNTER — Other Ambulatory Visit: Payer: Self-pay

## 2021-06-18 DIAGNOSIS — Z9889 Other specified postprocedural states: Secondary | ICD-10-CM

## 2021-06-18 DIAGNOSIS — L600 Ingrowing nail: Secondary | ICD-10-CM

## 2021-06-18 NOTE — Telephone Encounter (Signed)
Pt left message asking for a call back about his orthotics..  I returned call after talking with Dr Rebekah Chesterfield (pt seen today for ingrown) to see if pt needed to see EJ.Dr Amalia Hailey stated pt did not like orthotics. I left message for pt that I have saved a spot for him on 8.5 with Ej and to call me back to confirm.

## 2021-06-18 NOTE — Progress Notes (Signed)
   Subjective: Patient presents today for evaluation of pain to the lateral aspect of the right great toe. Patient is concerned for possible ingrown nail.  It is very sensitive to touch.  Patient presents today for further treatment and evaluation.  Patient also status post tarsal exostectomy of the left foot.  DOS: 03/27/2021.  He states that he continues to have some numbness to the area otherwise he is doing well.  Past Medical History:  Diagnosis Date   Bronchitis    Diverticulosis    FH: BPH (benign prostatic hypertrophy)    History of Graves' disease 01/19/2018   S/p ablation   HTN (hypertension)    Hyperlipidemia    Obesity    Psoriatic arthritis ([facility])    Rheumatoid arthritis ([place]) 08/07/2014   Sleep apnea    CPAP   Thyroid disease     Objective:  General: Well developed, nourished, in no acute distress, alert and oriented x3   Dermatology: Skin is warm, dry and supple bilateral.  Lateral border right great toe appears to be erythematous with evidence of an ingrowing nail. Pain on palpation noted to the border of the nail fold. The remaining nails appear unremarkable at this time. There are no open sores, lesions.  Vascular: Dorsalis Pedis artery and Posterior Tibial artery pedal pulses palpable. No lower extremity edema noted.   Neruologic: Grossly intact via light touch bilateral.  Musculoskeletal: Muscular strength within normal limits in all groups bilateral. Normal range of motion noted to all pedal and ankle joints.   Assesement: #1 Paronychia with ingrowing nail lateral border right great toe #2 Pain in toe  Plan of Care:  1. Patient evaluated.  2. Discussed treatment alternatives and plan of care. Explained nail avulsion procedure and post procedure course to patient. 3. Patient opted for permanent partial nail avulsion of the lateral border right great toe.  4. Prior to procedure, local anesthesia infiltration utilized using 3 ml of a 50:50 mixture of 2% plain  lidocaine and 0.5% plain marcaine in a normal hallux block fashion and a betadine prep performed.  5. Partial permanent nail avulsion with chemical matrixectomy performed using [id number] applications of phenol followed by alcohol flush.  6. Light dressing applied.  Post care instructions provided 7.  Recommend OTC triple antibiotic at home daily 8.  Return to clinic 2 weeks.  Edrick Kins, DPM [facility]  Dr. Edrick Kins, DPM    [address], [place] [address]                Office [phone]  Fax [phone]

## 2021-06-20 DIAGNOSIS — Z20822 Contact with and (suspected) exposure to covid-19: Secondary | ICD-10-CM | POA: Diagnosis not present

## 2021-06-23 ENCOUNTER — Telehealth: Payer: Self-pay | Admitting: Podiatry

## 2021-06-23 NOTE — Telephone Encounter (Signed)
Pt left message returning my call stated he wanted to talk to someone higher up and that the appt on august 5th would not work for him he was hoping to get in sooner. He is not happy with the orthotics.   After discussing with Dr Amalia Hailey I returned call and it went directly to voicemail an I left message that the next available with EJ would be 8.19 he is only here 2 times a month.But I did ask pt to call to discuss further.

## 2021-06-25 ENCOUNTER — Other Ambulatory Visit: Payer: Self-pay

## 2021-06-25 ENCOUNTER — Telehealth: Payer: Self-pay | Admitting: Internal Medicine

## 2021-06-25 MED ORDER — DOFETILIDE 250 MCG PO CAPS: 250.0000 ug | ORAL_CAPSULE | Freq: Two times a day (BID) | ORAL | 1 refills | Status: DC

## 2021-06-25 NOTE — Telephone Encounter (Signed)
   *  STAT* If patient is at the pharmacy, call can be transferred to refill team.   1. Which medications need to be refilled? (please list name of each medication and dose if known) dofetilide (TIKOSYN) 250 MCG capsule  2. Which pharmacy/location (including street and city if local pharmacy) is medication to be sent to? [facility], [address] [place], [place], [place] [address], fax [phone]  3. Do they need a 30 day or 90 day supply? 90 days  Pt needs to send prescription today due to [facility] has low stock

## 2021-07-02 ENCOUNTER — Ambulatory Visit (INDEPENDENT_AMBULATORY_CARE_PROVIDER_SITE_OTHER): Payer: Medicare Other | Admitting: Podiatry

## 2021-07-02 ENCOUNTER — Other Ambulatory Visit: Payer: Self-pay

## 2021-07-02 DIAGNOSIS — L600 Ingrowing nail: Secondary | ICD-10-CM | POA: Diagnosis not present

## 2021-07-02 MED ORDER — DOXYCYCLINE HYCLATE 100 MG PO TABS: 100.0000 mg | ORAL_TABLET | Freq: Two times a day (BID) | ORAL | 0 refills | Status: DC

## 2021-07-02 NOTE — Progress Notes (Signed)
   Subjective: 75 y.o. male presents today status post permanent nail avulsion procedure of the lateral border of the right great toe that was performed on 06/18/2021. Patient continues to have pain and tenderness to the right great toe. He has not soaked the toe or applied the antibiotic ointment as directed. He presents for further treatment and evaluation.   Past Medical History:  Diagnosis Date   Bronchitis    Diverticulosis    FH: BPH (benign prostatic hypertrophy)    History of Graves' disease 01/19/2018   S/p ablation   HTN (hypertension)    Hyperlipidemia    Obesity    Psoriatic arthritis ([facility])    Rheumatoid arthritis ([place]) 08/07/2014   Sleep apnea    CPAP   Thyroid disease     Objective: Skin is warm, dry and supple. Nail and respective nail fold appears to be healing appropriately. Open wound to the associated nail fold with a granular wound base and moderate amount of fibrotic tissue. Minimal drainage noted. Mild erythema around the periungual region likely due to phenol chemical matricectomy.  Assessment: #1 s/p partial permanent nail matrixectomy lateral border RT great toe   Plan of care: #1 patient was evaluated  #2 light debridement of open wound was performed to the periungual border of the respective toe using a currette. Antibiotic ointment and Band-Aid was applied. #3 Prescription for doxycycline 100 mg 2 times daily #20 #4 silvadene cream provided. Apply daily #5 patient is to return to clinic in 2 weeks  *going to [place] next week for 1 week   Edrick Kins, DPM [facility]  Dr. Edrick Kins, DPM    [address], [place] [address]                Office [phone]  Fax [phone]

## 2021-07-04 NOTE — Progress Notes (Signed)
Remote pacemaker transmission.   

## 2021-07-07 ENCOUNTER — Telehealth: Payer: Self-pay | Admitting: Podiatry

## 2021-07-07 NOTE — Telephone Encounter (Signed)
Pt called stating the doxycycline isn't working. Please advise.

## 2021-07-08 NOTE — Telephone Encounter (Signed)
Continue doxycycline and return to clinic as soon as he returns from [place]. Continue epsom salt soaks 2x daily. Continue silvadene cream to the area daily as well. - Dr. Amalia Hailey

## 2021-07-11 ENCOUNTER — Ambulatory Visit: Payer: Medicare Other | Admitting: Internal Medicine

## 2021-07-11 DIAGNOSIS — Z20822 Contact with and (suspected) exposure to covid-19: Secondary | ICD-10-CM | POA: Diagnosis not present

## 2021-07-21 ENCOUNTER — Ambulatory Visit (INDEPENDENT_AMBULATORY_CARE_PROVIDER_SITE_OTHER): Payer: Medicare Other | Admitting: Podiatry

## 2021-07-21 ENCOUNTER — Other Ambulatory Visit: Payer: Self-pay

## 2021-07-21 DIAGNOSIS — S90421A Blister (nonthermal), right great toe, initial encounter: Secondary | ICD-10-CM

## 2021-07-21 MED ORDER — HYDROXYZINE PAMOATE 25 MG PO CAPS: 25.0000 mg | ORAL_CAPSULE | Freq: Three times a day (TID) | ORAL | 1 refills | Status: DC | PRN

## 2021-07-21 NOTE — Progress Notes (Signed)
   Subjective: 75 y.o. male presents today for evaluation of a new complaint.  The patient states that he developed a blister to the right great toe after wearing a new pair of shoes that rubbed the toe.  It is not necessarily painful however he is concerned because it is very fluid-filled and he would like to have it evaluated today.  Past Medical History:  Diagnosis Date   Bronchitis    Diverticulosis    FH: BPH (benign prostatic hypertrophy)    History of Graves' disease 01/19/2018   S/p ablation   HTN (hypertension)    Hyperlipidemia    Obesity    Psoriatic arthritis ([place])    Rheumatoid arthritis ([place]) 08/07/2014   Sleep apnea    CPAP   Thyroid disease    Objective: Physical Exam General: The patient is alert and oriented x3 in no acute distress.  Dermatology: Skin is cool, dry and supple bilateral lower extremities. Negative for open lesions or macerations.  Superficial partial-thickness fluid-filled blister to the dorsum of the right hallux noted about 1 cm in diameter.  No purulence.  No erythema around the area.  Vascular: Palpable pedal pulses bilaterally. No edema or erythema noted. Capillary refill within normal limits.  Neurological: Epicritic and protective threshold grossly intact bilaterally.   Musculoskeletal Exam: All pedal and ankle joints range of motion within normal limits bilateral. Muscle strength 5/5 in all groups bilateral.    Assessment: #1 s/p partial permanent nail matrixectomy lateral border RT great toe; resolved #2 partial-thickness blister dorsal aspect of the right great toe   Plan of care: #1 patient was evaluated  #2  The blister was perforated and drained.  Recommend Silvadene cream and a Band-Aid daily.  Silvadene cream was applied today. #3 refill prescription for Atarax 25 mg as needed chronic foot itching #4 return to clinic as needed   Edrick Kins, DPM [facility]  Dr. Edrick Kins, DPM    [address], [place] [address]                Office [phone]  Fax [phone]

## 2021-07-26 ENCOUNTER — Encounter: Payer: Self-pay | Admitting: Podiatry

## 2021-07-29 ENCOUNTER — Other Ambulatory Visit: Payer: Self-pay | Admitting: Podiatry

## 2021-07-29 MED ORDER — DOXYCYCLINE HYCLATE 100 MG PO TABS: 100.0000 mg | ORAL_TABLET | Freq: Two times a day (BID) | ORAL | 0 refills | Status: DC

## 2021-07-30 DIAGNOSIS — Z961 Presence of intraocular lens: Secondary | ICD-10-CM | POA: Diagnosis not present

## 2021-08-05 DIAGNOSIS — Z961 Presence of intraocular lens: Secondary | ICD-10-CM | POA: Diagnosis not present

## 2021-08-06 ENCOUNTER — Ambulatory Visit (INDEPENDENT_AMBULATORY_CARE_PROVIDER_SITE_OTHER): Payer: Medicare Other | Admitting: Podiatry

## 2021-08-06 ENCOUNTER — Other Ambulatory Visit: Payer: Self-pay

## 2021-08-06 DIAGNOSIS — S90421A Blister (nonthermal), right great toe, initial encounter: Secondary | ICD-10-CM

## 2021-08-06 NOTE — Progress Notes (Signed)
   HPI: 75 y.o. male presenting today for follow-up evaluation of a blister that developed to the patient's right great toe.  He says that he continues to have some crust over the top of the toe.  He has been applying antibiotic cream as instructed.  Overall he feels that there is significant improvement no new complaints at this time  Past Medical History:  Diagnosis Date   Bronchitis    Diverticulosis    FH: BPH (benign prostatic hypertrophy)    History of Graves' disease 01/19/2018   S/p ablation   HTN (hypertension)    Hyperlipidemia    Obesity    Psoriatic arthritis (HCC)    Rheumatoid arthritis ([place]) 08/07/2014   Sleep apnea    CPAP   Thyroid disease      Physical Exam: General: The patient is alert and oriented x3 in no acute distress.  Dermatology: Loosely adhered superficial overlying eschar noted to the blister area of the right great toe.  After removal there is complete reepithelialization and healing underlying the eschar.  No open wound.  Vascular: Palpable pedal pulses bilaterally. No edema or erythema noted. Capillary refill within normal limits.  Neurological: Epicritic and protective threshold grossly intact bilaterally.   Musculoskeletal Exam: Range of motion within normal limits to all pedal and ankle joints bilateral. Muscle strength 5/5 in all groups bilateral.    Assessment: 1.  Partial-thickness blister dorsal aspect right great toe; resolved   Plan of Care:  1. Patient evaluated. 2.  Light debridement of the area was performed and the eschar was removed. 3.  Recommend shoes and sandals that do not irritate the toe 4.  Return to clinic as needed      Edrick Kins, DPM [facility]  Dr. Edrick Kins, DPM    [address], [place] [address]                Office [phone]  Fax [phone]

## 2021-08-08 ENCOUNTER — Other Ambulatory Visit: Payer: Self-pay

## 2021-08-08 ENCOUNTER — Encounter: Payer: Self-pay | Admitting: Internal Medicine

## 2021-08-08 ENCOUNTER — Ambulatory Visit (INDEPENDENT_AMBULATORY_CARE_PROVIDER_SITE_OTHER): Payer: Medicare Other | Admitting: Internal Medicine

## 2021-08-08 VITALS — BP 122/70 | HR 68 | Temp 98.2°F | Resp 16 | Ht 70.0 in | Wt 201.5 lb

## 2021-08-08 DIAGNOSIS — I10 Essential (primary) hypertension: Secondary | ICD-10-CM

## 2021-08-08 DIAGNOSIS — E785 Hyperlipidemia, unspecified: Secondary | ICD-10-CM | POA: Diagnosis not present

## 2021-08-08 DIAGNOSIS — D509 Iron deficiency anemia, unspecified: Secondary | ICD-10-CM | POA: Diagnosis not present

## 2021-08-08 DIAGNOSIS — E039 Hypothyroidism, unspecified: Secondary | ICD-10-CM

## 2021-08-08 DIAGNOSIS — E890 Postprocedural hypothyroidism: Secondary | ICD-10-CM | POA: Diagnosis not present

## 2021-08-08 LAB — COMPREHENSIVE METABOLIC PANEL
ALT: 14 U/L (ref 0–53)
AST: 19 U/L (ref 0–37)
Albumin: 4.4 g/dL (ref 3.5–5.2)
Alkaline Phosphatase: 66 U/L (ref 39–117)
BUN: 26 mg/dL — ABNORMAL HIGH (ref 6–23)
CO2: 27 mEq/L (ref 19–32)
Calcium: 10.3 mg/dL (ref 8.4–10.5)
Chloride: 103 mEq/L (ref 96–112)
Creatinine, Ser: 1.53 mg/dL — ABNORMAL HIGH (ref 0.40–1.50)
GFR: 43.84 mL/min — ABNORMAL LOW (ref >60.00)
Glucose, Bld: 89 mg/dL (ref 70–99)
Potassium: 4.8 mEq/L (ref 3.5–5.1)
Sodium: 139 mEq/L (ref 135–145)
Total Bilirubin: 0.6 mg/dL (ref 0.2–1.2)
Total Protein: 6.8 g/dL (ref 6.0–8.3)

## 2021-08-08 LAB — CBC WITH DIFFERENTIAL/PLATELET
Basophils Absolute: 0 10*3/uL (ref 0.0–0.1)
Basophils Relative: 0.6 % (ref 0.0–3.0)
Eosinophils Absolute: 0.1 10*3/uL (ref 0.0–0.7)
Eosinophils Relative: 1 % (ref 0.0–5.0)
HCT: 38.7 % — ABNORMAL LOW (ref 39.0–52.0)
Hemoglobin: 12.1 g/dL — ABNORMAL LOW (ref 13.0–17.0)
Lymphocytes Relative: 18.1 % (ref 12.0–46.0)
Lymphs Abs: 1 10*3/uL (ref 0.7–4.0)
MCHC: 31.3 g/dL (ref 30.0–36.0)
MCV: 69.4 fl — ABNORMAL LOW (ref 78.0–100.0)
Monocytes Absolute: 0.3 10*3/uL (ref 0.1–1.0)
Monocytes Relative: 4.9 % (ref 3.0–12.0)
Neutro Abs: 4.4 10*3/uL (ref 1.4–7.7)
Neutrophils Relative %: 75.4 % (ref 43.0–77.0)
Platelets: 182 10*3/uL (ref 150.0–400.0)
RBC: 5.58 Mil/uL (ref 4.22–5.81)
RDW: 18.4 % — ABNORMAL HIGH (ref 11.5–15.5)
WBC: 5.8 10*3/uL (ref 4.0–10.5)

## 2021-08-08 LAB — IRON: Iron: 15 ug/dL — ABNORMAL LOW (ref 42–165)

## 2021-08-08 LAB — LIPID PANEL
Cholesterol: 237 mg/dL — ABNORMAL HIGH (ref 0–200)
HDL: 42.7 mg/dL (ref >39.00)
LDL Cholesterol: 166 mg/dL — ABNORMAL HIGH (ref 0–99)
NonHDL: 194.42
Total CHOL/HDL Ratio: 6
Triglycerides: 144 mg/dL (ref 0.0–149.0)
VLDL: 28.8 mg/dL (ref 0.0–40.0)

## 2021-08-08 LAB — FERRITIN: Ferritin: 6.6 ng/mL — ABNORMAL LOW (ref 22.0–322.0)

## 2021-08-08 LAB — TSH: TSH: 2.91 u[IU]/mL (ref 0.35–5.50)

## 2021-08-08 NOTE — Progress Notes (Signed)
Subjective:    Patient ID: Martin Rubio, male    DOB: [birth year], 75 y.o.   MRN: [id number]  DOS:  08/08/2021 Type of visit - description: Routine visit Today with talk about all his chronic medical problems. Records from cardiology, urology and GI reviewed.   Review of Systems Specifically denies fever chills. No chest pain or difficulty breathing No nausea or vomiting. No blood in the stools or postprandial abdominal pain. Occasional cough. No gross hematuria  Past Medical History:  Diagnosis Date   Bronchitis    Diverticulosis    FH: BPH (benign prostatic hypertrophy)    History of Graves' disease 01/19/2018   S/p ablation   HTN (hypertension)    Hyperlipidemia    Obesity    Psoriatic arthritis (HCC)    Rheumatoid arthritis ([place]) 08/07/2014   Sleep apnea    CPAP   Thyroid disease     Past Surgical History:  Procedure Laterality Date   INGUINAL HERNIA REPAIR     bilateral   KNEE SURGERY     bilateral arthroscopic   l foot surgery Left 12/2020   PACEMAKER IMPLANT N/A 12/13/2019   Procedure: PACEMAKER IMPLANT;  Surgeon: Evans Lance, MD;  Location: [facility];  Service: Cardiovascular;  Laterality: N/A;   TRANSURETHRAL RESECTION OF PROSTATE     UMBILICAL HERNIA REPAIR     Social History   Socioeconomic History   Marital status: Married    Spouse name: Not on file   Number of children: 2   Years of education: Not on file   Highest education level: Not on file  Occupational History   Occupation: Retired    Fish farm manager: [employer]  Tobacco Use   Smoking status: Former    Types: Cigarettes   Smokeless tobacco: Never   Tobacco comments:    Quit 46 years ago.  Vaping Use   Vaping Use: Never used  Substance and Sexual Activity   Alcohol use: No   Drug use: No   Sexual activity: Not on file  Other Topics Concern   Not on file  Social History Narrative   Lives with wife.   Social Determinants of Health   Financial Resource Strain: Not on  file  Food Insecurity: Not on file  Transportation Needs: Not on file  Physical Activity: Not on file  Stress: Not on file  Social Connections: Not on file  Intimate Partner Violence: Not on file    Allergies as of 08/08/2021       Reactions   Codeine    Tension, nasty feeling   Nsaids Other (See Comments)   Renal insufficiency   Prednisone Other (See Comments)   Unknown/ sometimes takes with lower dose   Statins Other (See Comments)   Joint pain   Sulfa Antibiotics Other (See Comments)   Joint pain        Medication List        Accurate as of August 08, 2021 12:44 PM. If you have any questions, ask your nurse or doctor.          STOP taking these medications    alendronate 70 MG tablet Commonly known as: FOSAMAX Stopped by: Kathlene November, MD   ciprofloxacin 500 MG tablet Commonly known as: CIPRO Stopped by: Kathlene November, MD   ibuprofen 200 MG tablet Commonly known as: ADVIL Stopped by: Kathlene November, MD   Loratadine 10 MG Caps Stopped by: Kathlene November, MD   oxyCODONE-acetaminophen 5-325 MG tablet Commonly known  as: Percocet Stopped by: Kathlene November, MD   pantoprazole 40 MG tablet Commonly known as: PROTONIX Stopped by: Kathlene November, MD   Praluent 150 MG/ML Soaj Generic drug: Alirocumab Stopped by: Kathlene November, MD   tadalafil 20 MG tablet Commonly known as: CIALIS Stopped by: Kathlene November, MD   zinc gluconate 50 MG tablet Stopped by: Kathlene November, MD       TAKE these medications    acetaminophen 325 MG tablet Commonly known as: TYLENOL Take 650 mg by mouth as needed for mild pain.   ALLERGY RELIEF PO Take 1 tablet by mouth as needed.   benzonatate 100 MG capsule Commonly known as: TESSALON Take 1 capsule (100 mg total) by mouth 3 (three) times daily as needed for cough.   dofetilide 250 MCG capsule Commonly known as: TIKOSYN Take 1 capsule (250 mcg total) by mouth 2 (two) times daily.   doxycycline 100 MG tablet Commonly known as: VIBRA-TABS Take 1 tablet (100  mg total) by mouth 2 (two) times daily.   Eliquis 5 MG Tabs tablet Generic drug: apixaban Take 1 tablet (5 mg total) by mouth 2 (two) times daily.   Enbrel 50 MG/ML injection Generic drug: etanercept Inject 50 mg into the skin once a week.   Flovent HFA 220 MCG/ACT inhaler Generic drug: fluticasone 1 puff   fluticasone 50 MCG/ACT nasal spray Commonly known as: FLONASE Place 1 spray into both nostrils as needed for allergies.   FOLIC ACID PO Take by mouth.   gabapentin 100 MG capsule Commonly known as: NEURONTIN Take 1 capsule (100 mg total) by mouth 3 (three) times daily.   hydrOXYzine 25 MG capsule Commonly known as: VISTARIL Take 1 capsule (25 mg total) by mouth every 8 (eight) hours as needed.   levothyroxine 125 MCG tablet Commonly known as: SYNTHROID Take 1 tablet (125 mcg total) by mouth daily before breakfast.   losartan 50 MG tablet Commonly known as: COZAAR Take 1 tablet (50 mg total) by mouth in the morning and at bedtime.   meclizine 25 MG tablet Commonly known as: ANTIVERT Take 1 tablet by mouth 3 (three) times daily as needed.   melatonin 5 MG Tabs Take 5 mg by mouth at bedtime.   omeprazole 20 MG capsule Commonly known as: PRILOSEC Take 20 mg by mouth daily. Buys the [employer] brand.   predniSONE 5 MG tablet Commonly known as: DELTASONE Take by mouth as needed.   sucralfate 1 g tablet Commonly known as: CARAFATE 1 tablet on an empty stomach   tadalafil (PAH) 20 MG tablet Commonly known as: ADCIRCA Take 20 mg by mouth daily as needed.   tamsulosin 0.4 MG Caps capsule Commonly known as: FLOMAX Take 0.4 mg by mouth at bedtime.   triamcinolone cream 0.1 % Commonly known as: KENALOG Apply 1 application topically as needed for itching.   Vitamin B 12 500 MCG Tabs Take 1,000 mcg by mouth daily.   VITAMIN C ADULT GUMMIES PO Take by mouth. 1 gummies daily   Vitamin D 125 MCG (5000 UT) Caps Take 5,000 Units by mouth daily.            Objective:   Physical Exam BP 122/70 (BP Location: Left Arm, Patient Position: Sitting, Cuff Size: Normal)   Pulse 68   Temp 98.2 F (36.8 C) (Oral)   Resp 16   Ht 5\' 10"  (1.778 m)   Wt 201 lb 8 oz (91.4 kg)   SpO2 96%   BMI 28.91 kg/m  General: Well developed, NAD, BMI noted Neck: No  thyromegaly  HEENT:  Normocephalic . Face symmetric, atraumatic Lungs:  CTA B Normal respiratory effort, no intercostal retractions, no accessory muscle use. Heart: RRR, trace systolic murmur.  Abdomen:  Not distended, soft, non-tender. No rebound or rigidity.   Lower extremities: no pretibial edema bilaterally  Skin: Exposed areas without rash. Not pale. Not jaundice Neurologic:  alert & oriented X3.  Speech normal, gait appropriate for age and unassisted Strength symmetric and appropriate for age.  Psych: Cognition and judgment appear intact.  Cooperative with normal attention span and concentration.  Behavior appropriate. No anxious or depressed appearing.     Assessment    ASSESSMENT  (transfer to me 12/2019) Hyperglycemia HTN High cholesterol, seen at the lipid clinic Hypothyroidism  Psoriatic Arthritis [name]  Osteoporosis: on fosamax rx by PCP, start holiday by mid 2022 (see visit note from 02/29/2020) CKD mild,stable . Creatinine ~1.5 CV: -Paroxysmal A. Fib, dx 09/2019 anticoagulated -Sick sinus syndrome, s/p pacemaker (12/13/2019) -Septal hypertrophy per echo -Left common iliac aneurysm, S/U cardiology OSA on CPAP BPH [name]  PLAN Hyperglycemia: Last A1c 5.8 HTN: Seems well controlled, continue losartan, check CMP High cholesterol: Has not taken Praluent in months, check a FLP, this problem is follow-up at the lipid clinic, I will forward results to them. Osteoporosis: On Fosamax holiday at the present time, last vitamin D level very good. Anemia: Mild per chart review, no GI symptoms at the present time.  Check a CBC, iron, ferritin.  See comments under  annual checkup, had multiple colonoscopies before. BPH: Saw Dr. Felipa Eth 12-2020. Paroxysmal A. fib, sick sinus syndrome,: Saw cardiology 02/10/2021 Pacemaker was interrogated, A. fib: No change in therapy. Noted to have a murmur from aortic sclerosis. DOE: Cardiology noted that he had a negative pulmonary work-up and perfusion study. OSA: Saw pulmonary 01/21/2021 on CPAP. Preventive care reviewed RTC 4 to 6 months   This visit occurred during the SARS-CoV-2 public health emergency.  Safety protocols were in place, including screening questions prior to the visit, additional usage of staff PPE, and extensive cleaning of exam room while observing appropriate contact time as indicated for disinfecting solutions.

## 2021-08-08 NOTE — Assessment & Plan Note (Signed)
Hyperglycemia: Last A1c 5.8 HTN: Seems well controlled, continue losartan, check CMP High cholesterol: Has not taken Praluent in months, check a FLP, this problem is follow-up at the lipid clinic, I will forward results to them. Osteoporosis: On Fosamax holiday at the present time, last vitamin D level very good. Anemia: Mild per chart review, no GI symptoms at the present time.  Check a CBC, iron, ferritin.  See comments under annual checkup, had multiple colonoscopies before. BPH: Saw Dr. Felipa Eth 12-2020. Paroxysmal A. fib, sick sinus syndrome,: Saw cardiology 02/10/2021 Pacemaker was interrogated, A. fib: No change in therapy. Noted to have a murmur from aortic sclerosis. DOE: Cardiology noted that he had a negative pulmonary work-up and perfusion study. OSA: Saw pulmonary 01/21/2021 on CPAP. Preventive care reviewed RTC 4 to 6 months

## 2021-08-08 NOTE — Patient Instructions (Addendum)
Recommend to proceed with the following vaccines at your pharmacy:  Shingrix (shingles) Tdap (tetanus) Flu shot this fall  Check the  blood pressure regularly  BP GOAL is between 110/65 and  135/85. If it is consistently higher or lower, let me know     GO TO THE LAB : Get the blood work     [place], [place] back for a checkup in 4 to 6 months     "Living will", "Oasis of attorney": Advanced care planning  (If you already have a living will or healthcare power of attorney, please bring the copy to be scanned in your chart.)  Advance care planning is a process that supports adults in  understanding and sharing their preferences regarding future medical care.   The patient's preferences are recorded in documents called Advance Directives.    Advanced directives are completed (and can be modified at any time) while the patient is in full mental capacity.   The documentation should be available at all times to the patient, the family and the healthcare providers.  Bring in a copy to be scanned in your chart is an excellent idea and is recommended   This legal documents direct treatment decision making and/or appoint a surrogate to make the decision if the patient is not capable to do so.    Advance directives can be documented in many types of formats,  documents have names such as:  Lliving will  Durable power of attorney for healthcare (healthcare proxy or healthcare power of attorney)  Combined directives  Physician orders for life-sustaining treatment    More information at:  [email or web address]

## 2021-08-08 NOTE — Assessment & Plan Note (Signed)
Td 2002, rec booster  [facility] 13 2017 PNM 23 2011 Shingrix recommended "I already had it" Rec flu shot yearly Had COVID vax x 1, DOES NOT desire to take another one [facility]:  --Cscope 2012 (per GI note Dr Rolm Bookbinder 06-07-2018 "next 2022") --Cscope 11-04-2016 per [facility]  -- Saw GI  Dr Rolm Bookbinder 06-07-2018 rx EGD, Cscope (done??). Will send letter to GI Prostate cancer screening: Sees urology POA d/w pt

## 2021-08-12 NOTE — Addendum Note (Signed)
Addended byDamita Dunnings D on: 08/12/2021 08:53 AM   Modules accepted: Orders

## 2021-08-13 ENCOUNTER — Telehealth: Payer: Self-pay

## 2021-08-13 DIAGNOSIS — H04123 Dry eye syndrome of bilateral lacrimal glands: Secondary | ICD-10-CM | POA: Diagnosis not present

## 2021-08-13 DIAGNOSIS — H26492 Other secondary cataract, left eye: Secondary | ICD-10-CM | POA: Diagnosis not present

## 2021-08-13 NOTE — Telephone Encounter (Signed)
Pt called back to discuss labs from 08/08/21. I informed him that we have placed a referral to GI (previously Dr. Rolm Bookbinder at [place])- Pt states that "I will not see that little Muslim woman and that no Muslim or [place] is going to put their hands on me." I informed that Muslim/Indian providers are just as competent as other providers, Pt continue to disagree, he asked for a referral in the [facility] system- I informed him that [facility] is not accepting new patients at this time d/t quantity of referrals, asked if he would be intersted in going to [place] or [place] to see a [facility] provider- he stated no. I again informed the importance of seeing GI for further work-up. Pt did not state if he would go or not by end of telephone call.

## 2021-08-18 ENCOUNTER — Encounter: Payer: Self-pay | Admitting: Internal Medicine

## 2021-08-19 ENCOUNTER — Ambulatory Visit: Payer: Medicare Other | Admitting: Cardiology

## 2021-08-19 NOTE — Telephone Encounter (Signed)
Records reviewed, had a colonoscopy 07/29/2011. Pathology show sigmoid polyp felt to be benign. Due for colonoscopy since 2017. Please arrange a GI referral to [place]  or [place].  (Of note, I am appalled by his comments regards my GI colleagues, they have all my respect)

## 2021-08-20 NOTE — Telephone Encounter (Signed)
Looks like Pt went ahead and scheduled w/ Dr. Rolm Bookbinder on 09/01/21.

## 2021-08-20 NOTE — Telephone Encounter (Signed)
Noted  

## 2021-08-24 NOTE — Progress Notes (Signed)
Cardiology Office Note   Date:  08/26/2021   ID:  Verlon, Pischke 09/21/1946, MRN [id number]  PCP:  Colon Branch, MD  Cardiologist:   Minus Breeding, MD   No chief complaint on file.     History of Present Illness: Martin Rubio is a 75 y.o. male who presents for follow up of HTN.  His last echo in Nov of 2016 demonstrated no septal hypertrophy.  He has had atrial fib/flutter paroxysmally with slow ventricular rates.  He had pacemaker placement.   He continued to have PAF and was started on Tikosyn.   Since I last saw him he has had no new complaints.  He still gets dyspneic.  He still has episodes where he feels very fatigued and that seems to be related to possibly some paroxysmal atrial fibrillation.  I looked at the device interrogation and he still does do this episodically.  Is very difficult to correlate.  It is not sustained as it was before.  He has normal pacemaker function otherwise.  He apparently has been found to have iron deficiency and is getting an upper GI for this.  I do see however that his hemoglobin is most recently was 12.1.  He is not really having any new chest discomfort, neck or arm discomfort.  He is not having any presyncope or syncope.  He is not having any PND or orthopnea.  Of note he took himself off probably only for let himself run out of the medicine about a year ago.   Past Medical History:  Diagnosis Date   Bronchitis    Diverticulosis    FH: BPH (benign prostatic hypertrophy)    History of Graves' disease 01/19/2018   S/p ablation   HTN (hypertension)    Hyperlipidemia    Obesity    Psoriatic arthritis (HCC)    Rheumatoid arthritis ([place]) 08/07/2014   Sleep apnea    CPAP   Thyroid disease     Past Surgical History:  Procedure Laterality Date   INGUINAL HERNIA REPAIR     bilateral   KNEE SURGERY     bilateral arthroscopic   l foot surgery Left 12/2020   PACEMAKER IMPLANT N/A 12/13/2019   Procedure: PACEMAKER IMPLANT;   Surgeon: Evans Lance, MD;  Location: [facility];  Service: Cardiovascular;  Laterality: N/A;   TRANSURETHRAL RESECTION OF PROSTATE     UMBILICAL HERNIA REPAIR       Current Outpatient Medications  Medication Sig Dispense Refill   acetaminophen (TYLENOL) 325 MG tablet Take 650 mg by mouth as needed for mild pain.      apixaban (ELIQUIS) 5 MG TABS tablet Take 1 tablet (5 mg total) by mouth 2 (two) times daily. 180 tablet 1   Ascorbic Acid (VITAMIN C ADULT GUMMIES PO) Take by mouth. 1 gummies daily     Chlorpheniramine Maleate (ALLERGY RELIEF PO) Take 1 tablet by mouth as needed.     Cholecalciferol (VITAMIN D) 125 MCG (5000 UT) CAPS Take 5,000 Units by mouth daily.      Cyanocobalamin (VITAMIN B 12) 500 MCG TABS Take 1,000 mcg by mouth daily.      dofetilide (TIKOSYN) 250 MCG capsule Take 1 capsule (250 mcg total) by mouth 2 (two) times daily. 180 capsule 1   doxycycline (VIBRA-TABS) 100 MG tablet Take 1 tablet (100 mg total) by mouth 2 (two) times daily. 20 tablet 0   etanercept (ENBREL) 50 MG/ML injection Inject 50 mg into  the skin once a week.     fluticasone (FLONASE) 50 MCG/ACT nasal spray Place 1 spray into both nostrils as needed for allergies.      fluticasone (FLOVENT HFA) 220 MCG/ACT inhaler      FOLIC ACID PO Take by mouth.     gabapentin (NEURONTIN) 100 MG capsule Take 1 capsule (100 mg total) by mouth 3 (three) times daily. 90 capsule 1   hydrOXYzine (VISTARIL) 25 MG capsule Take 1 capsule (25 mg total) by mouth every 8 (eight) hours as needed. 60 capsule 1   levothyroxine (SYNTHROID) 125 MCG tablet Take 1 tablet (125 mcg total) by mouth daily before breakfast. 90 tablet 2   losartan (COZAAR) 50 MG tablet Take 1 tablet (50 mg total) by mouth in the morning and at bedtime. 180 tablet 3   meclizine (ANTIVERT) 25 MG tablet Take 1 tablet by mouth 3 (three) times daily as needed.     Melatonin 5 MG TABS Take 5 mg by mouth at bedtime.     omeprazole (PRILOSEC) 20 MG capsule  Take 20 mg by mouth daily. Buys the [employer] brand.     predniSONE (DELTASONE) 5 MG tablet Take by mouth as needed.     sucralfate (CARAFATE) 1 g tablet 1 tablet on an empty stomach     tadalafil, PAH, (ADCIRCA) 20 MG tablet Take 20 mg by mouth daily as needed.     tamsulosin (FLOMAX) 0.4 MG CAPS capsule Take 0.4 mg by mouth at bedtime.      triamcinolone cream (KENALOG) 0.1 % Apply 1 application topically as needed for itching.      No current facility-administered medications for this visit.    Allergies:   Codeine, Nsaids, Prednisone, Statins, and Sulfa antibiotics    ROS:  Please see the history of present illness.   Otherwise, review of systems are positive for none.   All other systems are reviewed and negative.    PHYSICAL EXAM: VS:  BP (!) 148/80   Pulse 61   Ht 5\' 10"  (1.778 m)   Wt 202 lb 12.8 oz (92 kg)   SpO2 98%   BMI 29.10 kg/m  , BMI Body mass index is 29.1 kg/m. GENERAL:  Well appearing NECK:  No jugular venous distention, waveform within normal limits, carotid upstroke brisk and symmetric, no bruits, no thyromegaly LUNGS:  Clear to auscultation bilaterally CHEST:  Unremarkable HEART:  PMI not displaced or sustained,S1 and S2 within normal limits, no S3, no S4, no clicks, no rubs, very brief apical systolic murmur nonradiating, no diastolic murmurs ABD:  Flat, positive bowel sounds normal in frequency in pitch, no bruits, no rebound, no guarding, no midline pulsatile mass, no hepatomegaly, no splenomegaly EXT:  2 plus pulses throughout, no edema, no cyanosis no clubbing   EKG:  EKG is  ordered today. Atrial paced rhythm rate 61, first-degree AV block, ventricular tracking, left axis deviation, voltage criteria for left ventricular hypertrophy, no acute ST-T wave changes.  Recent Labs: 11/11/2020: BNP 95.4 01/08/2021: Magnesium 1.9 08/08/2021: ALT 14; BUN 26; Creatinine, Ser 1.53; Hemoglobin 12.1; Platelets 182.0; Potassium 4.8; Sodium 139; TSH 2.91    Lipid Panel     Component Value Date/Time   CHOL 237 (H) 08/08/2021 1002   CHOL 142 01/30/2020 1018   TRIG 144.0 08/08/2021 1002   HDL 42.70 08/08/2021 1002   HDL 40 01/30/2020 1018   CHOLHDL 6 08/08/2021 1002   VLDL 28.8 08/08/2021 1002   LDLCALC 166 (H) 08/08/2021 1002  [place] 72 01/30/2020 1018      Wt Readings from Last 3 Encounters:  08/26/21 202 lb 12.8 oz (92 kg)  08/08/21 201 lb 8 oz (91.4 kg)  02/10/21 195 lb 6.4 oz (88.6 kg)      Other studies Reviewed: Additional studies/ records that were reviewed today include:  None Review of the above records demonstrates:  Please see elsewhere in the note.     ASSESSMENT AND PLAN:  Atrial fib Patient does have paroxysmal atrial fibrillation but he tolerates anticoagulation and is on maximal therapy with Tikosyn.  He had short nonsustained runs.  No change in therapy.   Pacemaker placement He is up-to-date with follow-up.  Left common iliac anuerysm He has follow-up imaging scheduled.  He has been seen by Dr. Fletcher Anon  HTN Blood pressure is at target at home slightly elevated here today.  He keep a blood pressure diary.  No change in therapy.  Murmur He has aortic sclerosis on echo in January 2021.  No further imaging.  Dyslipidemia We did paperwork today to review his Praluent.  His LDL is off of it was 166 with a total of 237.   Dyspnea: He had a negative perfusion study.  He has had a negative pulmonary work-up.  He has had an extensive work-up without pulmonary or cardiac etiology.  No change in therapy or further testing is indicated.     Current medicines are reviewed at length with the patient today.  The patient does not have concerns regarding medicines.  The following changes have been made:  None  Labs/ tests ordered today include:   None  No orders of the defined types were placed in this encounter.    Disposition:   FU with me in  12 months   Signed, Minus Breeding, MD  08/26/2021 3:59 PM    [facility]

## 2021-08-26 ENCOUNTER — Ambulatory Visit (INDEPENDENT_AMBULATORY_CARE_PROVIDER_SITE_OTHER): Payer: Medicare Other | Admitting: Cardiology

## 2021-08-26 ENCOUNTER — Encounter: Payer: Self-pay | Admitting: Cardiology

## 2021-08-26 ENCOUNTER — Other Ambulatory Visit: Payer: Self-pay

## 2021-08-26 VITALS — BP 148/80 | HR 61 | Ht 70.0 in | Wt 202.8 lb

## 2021-08-26 DIAGNOSIS — E785 Hyperlipidemia, unspecified: Secondary | ICD-10-CM | POA: Diagnosis not present

## 2021-08-26 DIAGNOSIS — I723 Aneurysm of iliac artery: Secondary | ICD-10-CM | POA: Diagnosis not present

## 2021-08-26 DIAGNOSIS — I480 Paroxysmal atrial fibrillation: Secondary | ICD-10-CM

## 2021-08-26 DIAGNOSIS — I10 Essential (primary) hypertension: Secondary | ICD-10-CM | POA: Diagnosis not present

## 2021-08-26 DIAGNOSIS — R0602 Shortness of breath: Secondary | ICD-10-CM

## 2021-08-26 NOTE — Patient Instructions (Signed)

## 2021-08-28 ENCOUNTER — Telehealth: Payer: Self-pay

## 2021-08-28 DIAGNOSIS — M150 Primary generalized (osteo)arthritis: Secondary | ICD-10-CM | POA: Diagnosis not present

## 2021-08-28 DIAGNOSIS — M25562 Pain in left knee: Secondary | ICD-10-CM | POA: Diagnosis not present

## 2021-08-28 DIAGNOSIS — M5136 Other intervertebral disc degeneration, lumbar region: Secondary | ICD-10-CM | POA: Diagnosis not present

## 2021-08-28 DIAGNOSIS — L409 Psoriasis, unspecified: Secondary | ICD-10-CM | POA: Diagnosis not present

## 2021-08-28 DIAGNOSIS — E663 Overweight: Secondary | ICD-10-CM | POA: Diagnosis not present

## 2021-08-28 DIAGNOSIS — M79671 Pain in right foot: Secondary | ICD-10-CM | POA: Diagnosis not present

## 2021-08-28 DIAGNOSIS — L4050 Arthropathic psoriasis, unspecified: Secondary | ICD-10-CM | POA: Diagnosis not present

## 2021-08-28 DIAGNOSIS — Z6828 Body mass index (BMI) 28.0-28.9, adult: Secondary | ICD-10-CM | POA: Diagnosis not present

## 2021-08-28 DIAGNOSIS — M25561 Pain in right knee: Secondary | ICD-10-CM | POA: Diagnosis not present

## 2021-08-28 NOTE — Telephone Encounter (Signed)
Cindy with [facility] called stating pt saw Dr. Amil Amen today and Dr. Amil Amen is requesting pt's most recent labs be faxed over to him for review.  Fax # is [phone], Attn: Dr. Amil Amen.

## 2021-08-28 NOTE — Telephone Encounter (Signed)
Labs faxed

## 2021-09-01 DIAGNOSIS — R1013 Epigastric pain: Secondary | ICD-10-CM | POA: Diagnosis not present

## 2021-09-01 DIAGNOSIS — D509 Iron deficiency anemia, unspecified: Secondary | ICD-10-CM | POA: Diagnosis not present

## 2021-09-01 DIAGNOSIS — Z7902 Long term (current) use of antithrombotics/antiplatelets: Secondary | ICD-10-CM | POA: Diagnosis not present

## 2021-09-02 ENCOUNTER — Telehealth: Payer: Self-pay | Admitting: *Deleted

## 2021-09-02 ENCOUNTER — Telehealth: Payer: Self-pay | Admitting: Cardiology

## 2021-09-02 DIAGNOSIS — D1801 Hemangioma of skin and subcutaneous tissue: Secondary | ICD-10-CM | POA: Diagnosis not present

## 2021-09-02 DIAGNOSIS — B351 Tinea unguium: Secondary | ICD-10-CM | POA: Diagnosis not present

## 2021-09-02 DIAGNOSIS — L570 Actinic keratosis: Secondary | ICD-10-CM | POA: Diagnosis not present

## 2021-09-02 DIAGNOSIS — Z85828 Personal history of other malignant neoplasm of skin: Secondary | ICD-10-CM | POA: Diagnosis not present

## 2021-09-02 DIAGNOSIS — D225 Melanocytic nevi of trunk: Secondary | ICD-10-CM | POA: Diagnosis not present

## 2021-09-02 DIAGNOSIS — L821 Other seborrheic keratosis: Secondary | ICD-10-CM | POA: Diagnosis not present

## 2021-09-02 NOTE — Telephone Encounter (Signed)
[employer] called to say that they need an new update enrollment form filled out. It was faxed on 9/22. The fax [phone] or healthcare provider portal [email or web address]. Please advise

## 2021-09-02 NOTE — Telephone Encounter (Signed)
Returned a call to pt that they changed the enrollement forms and we got him agreeable to fill out new forms because [employer] changed the pass forms

## 2021-09-02 NOTE — Telephone Encounter (Signed)
   [facility] Pre-operative Risk Assessment    Patient Name: Martin Rubio  DOB: [birth year] MRN: [id number]  Request for surgical clearance:  What type of surgery is being performed? Coloscopy/EGD  When is this surgery scheduled? TBD  What type of clearance is required (medical clearance vs. Pharmacy clearance to hold med vs. Both)? pharmacy  Are there any medications that need to be held prior to surgery and how long? Eliquis x 2-3 days  Practice name and name of physician performing surgery? [facility] Dr. Rolm Bookbinder  What is the office phone number? [phone]   7.   What is the office fax number? [phone]  8.   Anesthesia type (None, local, MAC, general) ?    Martin Rubio Martin Rubio 09/02/2021, 1:48 PM  _________________________________________________________________   (provider comments below)

## 2021-09-03 NOTE — Telephone Encounter (Signed)
Patient with diagnosis of afib on Eliquis for anticoagulation.    Procedure: colonoscopy/EGD Date of procedure: TBD  CHA2DS2-VASc Score = 4  This indicates a 4.8% annual risk of stroke. The patient's score is based upon: CHF History: 0 HTN History: 1 Diabetes History: 0 Stroke History: 0 Vascular Disease History: 1 Age Score: 2 Gender Score: 0  CrCl 57mL/min Platelet count 182K  Per office protocol, patient can hold Eliquis for 2 days prior to procedure.

## 2021-09-03 NOTE — Telephone Encounter (Signed)
    Patient Name: Martin Rubio  DOB: December 22, 1945 MRN: [id number]  Primary Cardiologist: Minus Breeding, MD  Chart reviewed as part of pre-operative protocol coverage.   Per pharmacy recommendations, patient can hold eliquis 2 days prior to his upcoming EGD/Colonoscopy with plans to restart as soon as he is cleared to do so by his gastroenterologist.  I will route this recommendation to the requesting party via [facility] fax function and remove from pre-op pool.  Please call with questions.  Abigail Butts, PA-C 09/03/2021, 12:40 PM

## 2021-09-05 ENCOUNTER — Telehealth: Payer: Self-pay | Admitting: Cardiology

## 2021-09-05 NOTE — Telephone Encounter (Signed)
[employer] updated enrollment form is needing patient to sign section 6 rather than the nurse Grandville Silos signed off). Also missing section two (prescription section) of the application.Marland Kitchen alternatively a hand signed and dated prescription can be sent in with primary diagnosis and doctors npi on it as well. Lastly pt needs proof of spend down (shows that pt has at least spent $500 on medication for this calander year). Submit via fax or healthcare portal @ [phone] or [email or web address]

## 2021-09-08 NOTE — Telephone Encounter (Signed)
Lmomed the pt that they needed him to complete the forms himself and will mail him the forms. Advised him to call if he has questions.

## 2021-09-09 ENCOUNTER — Ambulatory Visit (INDEPENDENT_AMBULATORY_CARE_PROVIDER_SITE_OTHER): Payer: Medicare Other | Admitting: Cardiovascular Disease

## 2021-09-09 ENCOUNTER — Encounter: Payer: Self-pay | Admitting: Cardiovascular Disease

## 2021-09-09 ENCOUNTER — Other Ambulatory Visit: Payer: Self-pay

## 2021-09-09 VITALS — BP 160/82 | HR 60 | Ht 70.0 in | Wt 201.2 lb

## 2021-09-09 DIAGNOSIS — I739 Peripheral vascular disease, unspecified: Secondary | ICD-10-CM

## 2021-09-09 DIAGNOSIS — I10 Essential (primary) hypertension: Secondary | ICD-10-CM | POA: Diagnosis not present

## 2021-09-09 DIAGNOSIS — I480 Paroxysmal atrial fibrillation: Secondary | ICD-10-CM | POA: Diagnosis not present

## 2021-09-09 DIAGNOSIS — E785 Hyperlipidemia, unspecified: Secondary | ICD-10-CM | POA: Diagnosis not present

## 2021-09-09 MED ORDER — AMLODIPINE BESYLATE 5 MG PO TABS: 5.0000 mg | ORAL_TABLET | Freq: Every day | ORAL | 1 refills | Status: DC

## 2021-09-09 NOTE — Progress Notes (Signed)
Cardiology Office Note   Date:  09/09/2021   ID:  Martin Rubio, DOB 1946-05-31, MRN [id number]  PCP:  Colon Branch, MD  Cardiologist: Dr. [name]  No chief complaint on file.     History of Present Illness: Martin Rubio is a 75 y.o. male who is here today for a follow-up visit regarding peripheral arterial disease and iliac artery aneurysms.   He has known history of essential hypertension, paroxysmal atrial fibrillation/flutter, bradycardia status post pacemaker placement, psoriatic arthritis on Enbrel, carotid disease and hyperlipidemia.  He has history of remote smoking but quit more than 45 years ago.  He is not diabetic. The patient has been followed for iliac aneurysms.  He has known history of left iliac aneurysm for many years since at least 2016.  At that time, the size was 1.7 cm in diameter.  Most recent duplex in October 2021  showed no evidence of aortic aneurysm.  There was evidence of iliac aneurysms and stenosis.  Left common iliac artery measured 2.3 cm with peak velocity of 449.  Right common iliac artery measured 1.8 cm.   ABI was mildly reduced on the left side at 0.85.  He reports mild worsening of left leg claudication which happens after walking short distance but the symptoms are overall mild.  Shortness of breath is stable.  Blood pressure continues to be elevated.  Past Medical History:  Diagnosis Date   Bronchitis    Diverticulosis    FH: BPH (benign prostatic hypertrophy)    History of Graves' disease 01/19/2018   S/p ablation   HTN (hypertension)    Hyperlipidemia    Obesity    Psoriatic arthritis (HCC)    Rheumatoid arthritis ([place]) 08/07/2014   Sleep apnea    CPAP   Thyroid disease     Past Surgical History:  Procedure Laterality Date   INGUINAL HERNIA REPAIR     bilateral   KNEE SURGERY     bilateral arthroscopic   l foot surgery Left 12/2020   PACEMAKER IMPLANT N/A 12/13/2019   Procedure: PACEMAKER IMPLANT;  Surgeon: Evans Lance,  MD;  Location: [facility];  Service: Cardiovascular;  Laterality: N/A;   TRANSURETHRAL RESECTION OF PROSTATE     UMBILICAL HERNIA REPAIR       Current Outpatient Medications  Medication Sig Dispense Refill   acetaminophen (TYLENOL) 325 MG tablet Take 650 mg by mouth as needed for mild pain.      apixaban (ELIQUIS) 5 MG TABS tablet Take 1 tablet (5 mg total) by mouth 2 (two) times daily. 180 tablet 1   Ascorbic Acid (VITAMIN C ADULT GUMMIES PO) Take by mouth. 1 gummies daily     Chlorpheniramine Maleate (ALLERGY RELIEF PO) Take 1 tablet by mouth as needed.     Cholecalciferol (VITAMIN D) 125 MCG (5000 UT) CAPS Take 5,000 Units by mouth daily.      Cyanocobalamin (VITAMIN B 12) 500 MCG TABS Take 1,000 mcg by mouth daily.      dofetilide (TIKOSYN) 250 MCG capsule Take 1 capsule (250 mcg total) by mouth 2 (two) times daily. 180 capsule 1   doxycycline (VIBRA-TABS) 100 MG tablet Take 1 tablet (100 mg total) by mouth 2 (two) times daily. 20 tablet 0   etanercept (ENBREL) 50 MG/ML injection Inject 50 mg into the skin once a week.     fluticasone (FLONASE) 50 MCG/ACT nasal spray Place 1 spray into both nostrils as needed for allergies.  fluticasone (FLOVENT HFA) 220 MCG/ACT inhaler      FOLIC ACID PO Take by mouth.     gabapentin (NEURONTIN) 100 MG capsule Take 1 capsule (100 mg total) by mouth 3 (three) times daily. 90 capsule 1   hydrOXYzine (VISTARIL) 25 MG capsule Take 1 capsule (25 mg total) by mouth every 8 (eight) hours as needed. 60 capsule 1   levothyroxine (SYNTHROID) 125 MCG tablet Take 1 tablet (125 mcg total) by mouth daily before breakfast. 90 tablet 2   losartan (COZAAR) 50 MG tablet Take 1 tablet (50 mg total) by mouth in the morning and at bedtime. 180 tablet 3   meclizine (ANTIVERT) 25 MG tablet Take 1 tablet by mouth 3 (three) times daily as needed.     Melatonin 5 MG TABS Take 5 mg by mouth at bedtime.     omeprazole (PRILOSEC) 20 MG capsule Take 20 mg by mouth  daily. Buys the [employer] brand.     predniSONE (DELTASONE) 5 MG tablet Take by mouth as needed.     sucralfate (CARAFATE) 1 g tablet 1 tablet on an empty stomach     tadalafil, PAH, (ADCIRCA) 20 MG tablet Take 20 mg by mouth daily as needed.     tamsulosin (FLOMAX) 0.4 MG CAPS capsule Take 0.4 mg by mouth at bedtime.      triamcinolone cream (KENALOG) 0.1 % Apply 1 application topically as needed for itching.      No current facility-administered medications for this visit.    Allergies:   Codeine, Nsaids, Prednisone, Statins, and Sulfa antibiotics    Social History:  The patient  reports that he has quit smoking. His smoking use included cigarettes. He has never used smokeless tobacco. He reports that he does not drink alcohol and does not use drugs.   Family History:  The patient's family history includes CAD (age of onset: 63) in his mother; CAD (age of onset: 41) in his father; Diabetes in his brother.    ROS:  Please see the history of present illness.   Otherwise, review of systems are positive for none.   All other systems are reviewed and negative.    PHYSICAL EXAM: VS:  BP (!) 160/82   Pulse 60   Ht 5\' 10"  (1.778 m)   Wt 201 lb 3.2 oz (91.3 kg)   SpO2 99%   BMI 28.87 kg/m  , BMI Body mass index is 28.87 kg/m. GEN: Well nourished, well developed, in no acute distress  HEENT: normal  Neck: no JVD, carotid bruits, or masses Cardiac: RRR; no murmurs, rubs, or gallops,no edema  Respiratory:  clear to auscultation bilaterally, normal work of breathing GI: soft, nontender, nondistended, + BS MS: no deformity or atrophy  Skin: warm and dry, no rash Neuro:  Strength and sensation are intact Psych: euthymic mood, full affect Vascular: Femoral pulse: normal on the right and slightly diminished on the left.   EKG:  EKG is not ordered today.    Recent Labs: 11/11/2020: BNP 95.4 01/08/2021: Magnesium 1.9 08/08/2021: ALT 14; BUN 26; Creatinine, Ser 1.53; Hemoglobin 12.1;  Platelets 182.0; Potassium 4.8; Sodium 139; TSH 2.91    Lipid Panel    Component Value Date/Time   CHOL 237 (H) 08/08/2021 1002   CHOL 142 01/30/2020 1018   TRIG 144.0 08/08/2021 1002   HDL 42.70 08/08/2021 1002   HDL 40 01/30/2020 1018   CHOLHDL 6 08/08/2021 1002   VLDL 28.8 08/08/2021 1002   LDLCALC 166 (H) 08/08/2021 1002  [place] 72 01/30/2020 1018      Wt Readings from Last 3 Encounters:  09/09/21 201 lb 3.2 oz (91.3 kg)  08/26/21 202 lb 12.8 oz (92 kg)  08/08/21 201 lb 8 oz (91.4 kg)        No flowsheet data found.    ASSESSMENT AND PLAN:  1.  Peripheral arterial disease with bilateral common iliac artery aneurysm: Likely asymptomatic as he has known history of chronic back pain with no significant change from before.  He does have moderate left leg claudication with known left common iliac artery stenosis.  Symptoms are currently not lifestyle limiting.  He is due for a repeat aortoiliac duplex to follow the size of iliac aneurysm.  The threshold for repair is above 3 cm.  2.  Paroxysmal atrial fibrillation on dofetilide: Maintaining sinus rhythm.  He is on long-term anticoagulation with Eliquis.  3. Essential hypertension: His blood pressure has been running high.  I discussed with him the importance of controlling his blood pressure especially in the setting of aneurysms.  He is on losartan.  I elected to add amlodipine 5 mg daily.  He does have underlying chronic kidney disease.  4.  Hyperlipidemia: His cholesterol was under excellent control with Praluent.  However, he could not refill the medication he is working with the lipid clinic to get back on a PCSK9 inhibitor as his most recent lipid profile showed an LDL of 166.    Disposition:   FU with me in 6 months  Signed,  Kathlyn Sacramento, MD  09/09/2021 8:23 AM    [place]

## 2021-09-09 NOTE — Patient Instructions (Signed)
Medication Instructions:  START Amlodipine 5 mg once daily  *If you need a refill on your cardiac medications before your next appointment, please call your pharmacy*   Lab Work: None ordered If you have labs (blood work) drawn today and your tests are completely normal, you will receive your results only by: [place] (if you have MyChart) OR A paper copy in the mail If you have any lab test that is abnormal or we need to change your treatment, we will call you to review the results.   Testing/Procedures: Your physician has requested that you have an Aorta/Iliac Duplex. This will be take place at [facility], [address].   No food after 11PM the night before.  Water is OK. (Don't drink liquids if you have been instructed not to for ANOTHER test). Take two Extra-Strength Gas-X capsules at bedtime the night before test.   Take an additional two Extra-Strength Gas-X capsules three (3) hours before the test or first thing in the morning.   Avoid foods that produce bowel gas, for 24 hours prior to exam (see below).   No breakfast, no chewing gum, no smoking or carbonated beverages. Patient may take morning medications with water. Come in for test at least 15 minutes early to register.  Follow-Up: At [facility], you and your health needs are our priority.  As part of our continuing mission to provide you with exceptional heart care, we have created designated Provider Care Teams.  These Care Teams include your primary Cardiologist (physician) and Advanced Practice Providers (APPs -  Physician Assistants and Nurse Practitioners) who all work together to provide you with the care you need, when you need it.  We recommend signing up for the patient portal called "MyChart".  Sign up information is provided on this After Visit Summary.  MyChart is used to connect with patients for Virtual Visits (Telemedicine).  Patients are able to view lab/test results, encounter notes, upcoming  appointments, etc.  Non-urgent messages can be sent to your provider as well.   To learn more about what you can do with MyChart, go to [email or web address].    Your next appointment:   6 month(s)  The format for your next appointment:   In Person  Provider:   Kathlyn Sacramento, MD

## 2021-09-11 ENCOUNTER — Ambulatory Visit (INDEPENDENT_AMBULATORY_CARE_PROVIDER_SITE_OTHER): Payer: Medicare Other

## 2021-09-11 DIAGNOSIS — I480 Paroxysmal atrial fibrillation: Secondary | ICD-10-CM

## 2021-09-14 LAB — CUP PACEART REMOTE DEVICE CHECK
Date Time Interrogation Session: 20221007085024
Implantable Lead Implant Date: 20210106
Implantable Lead Implant Date: 20210106
Implantable Lead Location: 753859
Implantable Lead Location: 753860
Implantable Lead Model: 3830
Implantable Lead Model: 5076
Implantable Pulse Generator Implant Date: 20210106

## 2021-09-16 ENCOUNTER — Telehealth: Payer: Self-pay

## 2021-09-16 ENCOUNTER — Other Ambulatory Visit: Payer: Self-pay | Admitting: Cardiovascular Disease

## 2021-09-16 DIAGNOSIS — I739 Peripheral vascular disease, unspecified: Secondary | ICD-10-CM

## 2021-09-16 DIAGNOSIS — I723 Aneurysm of iliac artery: Secondary | ICD-10-CM

## 2021-09-16 NOTE — Telephone Encounter (Signed)
Returned a call and spologized regarding the disconnection and stated that I would be happy to discuss the paperwork

## 2021-09-16 NOTE — Telephone Encounter (Signed)
Pt returning your call.... please advise 

## 2021-09-16 NOTE — Telephone Encounter (Signed)
Returning call but lmom pt to call me back

## 2021-09-22 NOTE — Progress Notes (Signed)
Remote pacemaker transmission.   

## 2021-09-25 ENCOUNTER — Ambulatory Visit (HOSPITAL_COMMUNITY)
Admission: RE | Admit: 2021-09-25 | Discharge: 2021-09-25 | Disposition: A | Discharge status: Home / Self Care | Payer: Medicare Other | Source: Ambulatory Visit | Attending: Cardiology | Admitting: Cardiology

## 2021-09-25 ENCOUNTER — Other Ambulatory Visit: Payer: Self-pay

## 2021-09-25 DIAGNOSIS — I739 Peripheral vascular disease, unspecified: Secondary | ICD-10-CM | POA: Diagnosis not present

## 2021-09-25 DIAGNOSIS — I723 Aneurysm of iliac artery: Secondary | ICD-10-CM | POA: Insufficient documentation

## 2021-10-03 ENCOUNTER — Ambulatory Visit (INDEPENDENT_AMBULATORY_CARE_PROVIDER_SITE_OTHER): Payer: Medicare Other | Admitting: Internal Medicine

## 2021-10-03 ENCOUNTER — Other Ambulatory Visit: Payer: Self-pay

## 2021-10-03 VITALS — BP 130/70 | HR 67 | Ht 70.0 in | Wt 208.0 lb

## 2021-10-03 DIAGNOSIS — I480 Paroxysmal atrial fibrillation: Secondary | ICD-10-CM

## 2021-10-03 DIAGNOSIS — I495 Sick sinus syndrome: Secondary | ICD-10-CM

## 2021-10-03 DIAGNOSIS — Z950 Presence of cardiac pacemaker: Secondary | ICD-10-CM

## 2021-10-03 LAB — CUP PACEART INCLINIC DEVICE CHECK
Battery Remaining Longevity: 138 mo
Battery Voltage: 3.02 V
Brady Statistic AP VP Percent: 17.67 %
Brady Statistic AP VS Percent: 47.86 %
Brady Statistic AS VP Percent: 0.58 %
Brady Statistic AS VS Percent: 33.89 %
Brady Statistic RA Percent Paced: 64.95 %
Brady Statistic RV Percent Paced: 18.52 %
Date Time Interrogation Session: 20221028163511
Implantable Lead Implant Date: 20210106
Implantable Lead Implant Date: 20210106
Implantable Lead Location: 753859
Implantable Lead Location: 753860
Implantable Lead Model: 3830
Implantable Lead Model: 5076
Implantable Pulse Generator Implant Date: 20210106
Lead Channel Impedance Value: 304 Ohm
Lead Channel Impedance Value: 361 Ohm
Lead Channel Impedance Value: 361 Ohm
Lead Channel Impedance Value: 551 Ohm
Lead Channel Pacing Threshold Amplitude: 0.5 V
Lead Channel Pacing Threshold Amplitude: 0.5 V
Lead Channel Pacing Threshold Pulse Width: 0.4 ms
Lead Channel Pacing Threshold Pulse Width: 0.4 ms
Lead Channel Sensing Intrinsic Amplitude: 1.75 mV
Lead Channel Sensing Intrinsic Amplitude: 2.5 mV
Lead Channel Sensing Intrinsic Amplitude: 28.25 mV
Lead Channel Sensing Intrinsic Amplitude: 31.625 mV
Lead Channel Setting Pacing Amplitude: 1.5 V
Lead Channel Setting Pacing Amplitude: 2.5 V
Lead Channel Setting Pacing Pulse Width: 0.4 ms
Lead Channel Setting Sensing Sensitivity: 2 mV

## 2021-10-03 NOTE — Progress Notes (Signed)
HPI [name] returns today for followup. He is a pleasant 75 yo man with CHB who underwent insertion of a DDD PM several months ago. He has experienced some sob and admits to sodium indiscretion. No chest pain. He has not gotten a Covid vaccine. He denies peripheral edema.       Allergies   Allergies  Allergen Reactions   Codeine     Tension, nasty feeling    Nsaids Other (See Comments)    Renal insufficiency   Prednisone Other (See Comments)    Unknown/ sometimes takes with lower dose   Statins Other (See Comments)    Joint pain    Sulfa Antibiotics Other (See Comments)    Joint pain     Current Outpatient Medications  Medication Sig Dispense Refill   acetaminophen (TYLENOL) 325 MG tablet Take 650 mg by mouth as needed for mild pain.      amLODipine (NORVASC) 5 MG tablet Take 1 tablet (5 mg total) by mouth daily. 90 tablet 1   apixaban (ELIQUIS) 5 MG TABS tablet Take 1 tablet (5 mg total) by mouth 2 (two) times daily. 180 tablet 1   Ascorbic Acid (VITAMIN C ADULT GUMMIES PO) Take by mouth. 1 gummies daily     Chlorpheniramine Maleate (ALLERGY RELIEF PO) Take 1 tablet by mouth as needed.     Cholecalciferol (VITAMIN D) 125 MCG (5000 UT) CAPS Take 5,000 Units by mouth daily.      Cyanocobalamin (VITAMIN B 12) 500 MCG TABS Take 1,000 mcg by mouth daily.      dofetilide (TIKOSYN) 250 MCG capsule Take 1 capsule (250 mcg total) by mouth 2 (two) times daily. 180 capsule 1   doxycycline (VIBRA-TABS) 100 MG tablet Take 1 tablet (100 mg total) by mouth 2 (two) times daily. 20 tablet 0   etanercept (ENBREL) 50 MG/ML injection Inject 50 mg into the skin once a week.     fluticasone (FLONASE) 50 MCG/ACT nasal spray Place 1 spray into both nostrils as needed for allergies.      fluticasone (FLOVENT HFA) 220 MCG/ACT inhaler      FOLIC ACID PO Take by mouth.     gabapentin (NEURONTIN) 100 MG capsule Take 1 capsule (100 mg total) by mouth 3 (three) times daily. 90 capsule 1    hydrOXYzine (VISTARIL) 25 MG capsule Take 1 capsule (25 mg total) by mouth every 8 (eight) hours as needed. 60 capsule 1   levothyroxine (SYNTHROID) 125 MCG tablet Take 1 tablet (125 mcg total) by mouth daily before breakfast. 90 tablet 2   losartan (COZAAR) 50 MG tablet Take 1 tablet (50 mg total) by mouth in the morning and at bedtime. 180 tablet 3   meclizine (ANTIVERT) 25 MG tablet Take 1 tablet by mouth 3 (three) times daily as needed.     Melatonin 5 MG TABS Take 5 mg by mouth at bedtime.     omeprazole (PRILOSEC) 20 MG capsule Take 20 mg by mouth daily. Buys the [employer] brand.     predniSONE (DELTASONE) 5 MG tablet Take by mouth as needed.     sucralfate (CARAFATE) 1 g tablet 1 tablet on an empty stomach     tadalafil, PAH, (ADCIRCA) 20 MG tablet Take 20 mg by mouth daily as needed.     tamsulosin (FLOMAX) 0.4 MG CAPS capsule Take 0.4 mg by mouth at bedtime.      triamcinolone cream (KENALOG) 0.1 % Apply 1 application topically as needed  for itching.      No current facility-administered medications for this visit.     Past Medical History:  Diagnosis Date   Bronchitis    Diverticulosis    FH: BPH (benign prostatic hypertrophy)    History of Graves' disease 01/19/2018   S/p ablation   HTN (hypertension)    Hyperlipidemia    Obesity    Psoriatic arthritis ([place])    Rheumatoid arthritis ([place]) 08/07/2014   Sleep apnea    CPAP   Thyroid disease     ROS:   All systems reviewed and negative except as noted in the HPI.   Past Surgical History:  Procedure Laterality Date   INGUINAL HERNIA REPAIR     bilateral   KNEE SURGERY     bilateral arthroscopic   l foot surgery Left 12/2020   PACEMAKER IMPLANT N/A 12/13/2019   Procedure: PACEMAKER IMPLANT;  Surgeon: Evans Lance, MD;  Location: [facility];  Service: Cardiovascular;  Laterality: N/A;   TRANSURETHRAL RESECTION OF PROSTATE     UMBILICAL HERNIA REPAIR       Family History  Problem Relation Age of Onset    CAD Mother 42       CABG   CAD Father 61       Died age 35   Diabetes Brother    Prostate cancer Neg Hx    Colon cancer Neg Hx      Social History   Socioeconomic History   Marital status: Married    Spouse name: Not on file   Number of children: 2   Years of education: Not on file   Highest education level: Not on file  Occupational History   Occupation: Retired    Fish farm manager: [employer]  Tobacco Use   Smoking status: Former    Types: Cigarettes   Smokeless tobacco: Never   Tobacco comments:    Quit 46 years ago.  Vaping Use   Vaping Use: Never used  Substance and Sexual Activity   Alcohol use: No   Drug use: No   Sexual activity: Not on file  Other Topics Concern   Not on file  Social History Narrative   Lives with wife.   Social Determinants of Health   Financial Resource Strain: Not on file  Food Insecurity: Not on file  Transportation Needs: Not on file  Physical Activity: Not on file  Stress: Not on file  Social Connections: Not on file  Intimate Partner Violence: Not on file     BP 130/70   Pulse 67   Ht 5\' 10"  (1.778 m)   Wt 208 lb (94.3 kg)   SpO2 97%   BMI 29.84 kg/m   Physical Exam:  Well appearing NAD HEENT: Unremarkable Neck:  No JVD, no thyromegally Lymphatics:  No adenopathy Back:  No CVA tenderness Lungs:  Clear with no wheezes HEART:  Regular rate rhythm, no murmurs, no rubs, no clicks Abd:  soft, positive bowel sounds, no organomegally, no rebound, no guarding Ext:  2 plus pulses, no edema, no cyanosis, no clubbing Skin:  No rashes no nodules Neuro:  CN II through XII intact, motor grossly intact  EKG - nsr with first degree AV block  DEVICE  Normal device function.  See PaceArt for details.   Assess/Plan:  1. PAF - he is mostly maintaining NSR. He will continue dofetilide. 2. Chronic diastolic heart failure - his symptoms are class 2. He admits to sodium indiscretion. He will work on  this. 3. HTN - his bp is  controlled. 4. PPM - his medtronic DDD PM remains programmed to MVP.   Mikle [email or web address].D.

## 2021-10-03 NOTE — Patient Instructions (Signed)
Medication Instructions:  Your physician recommends that you continue on your current medications as directed. Please refer to the Current Medication list given to you today.  Labwork: None ordered.  Testing/Procedures: None ordered.  Follow-Up: Your physician wants you to follow-up in: one year with Cristopher Peru, MD or one of the following Advanced Practice Providers on your designated Care Team:   Tommye Standard, [place] Legrand Como "Jonni Sanger" Chalmers Cater, [place]  Remote monitoring is used to monitor your Pacemaker from home. This monitoring reduces the number of office visits required to check your device to one time per year. It allows [place] to keep an eye on the functioning of your device to ensure it is working properly. You are scheduled for a device check from home on 12/11/2021. You may send your transmission at any time that day. If you have a wireless device, the transmission will be sent automatically. After your physician reviews your transmission, you will receive a postcard with your next transmission date.  Any Other Special Instructions Will Be Listed Below (If Applicable).  If you need a refill on your cardiac medications before your next appointment, please call your pharmacy.

## 2021-10-13 MED ORDER — PRALUENT 150 MG/ML ~~LOC~~ SOAJ: 150.0000 mg | SUBCUTANEOUS | 11 refills | Status: DC

## 2021-10-13 NOTE — Telephone Encounter (Signed)
Praluent approved through [employer]. Lmom for pt to call back rx sent to pharmacy

## 2021-10-13 NOTE — Addendum Note (Signed)
Addended by: Allean Found on: 10/13/2021 10:03 AM   Modules accepted: Orders

## 2021-10-17 ENCOUNTER — Telehealth: Payer: Self-pay | Admitting: Internal Medicine

## 2021-10-17 DIAGNOSIS — H9193 Unspecified hearing loss, bilateral: Secondary | ICD-10-CM

## 2021-10-17 NOTE — Telephone Encounter (Signed)
Referral placed.

## 2021-10-17 NOTE — Telephone Encounter (Signed)
Pt. Called and stated that he would like a referral to. This is where his wife goes and he stated they have good prices on their hearing items. [facility]. [phone]

## 2021-10-22 ENCOUNTER — Telehealth: Payer: Self-pay | Admitting: Internal Medicine

## 2021-10-22 NOTE — Telephone Encounter (Signed)
Left message for patient to call back and schedule Medicare Annual Wellness Visit (AWV) in office.   If not able to come in office, please offer to do virtually or by telephone.  Left office number and my jabber #[phone].  AWVI eligible as of 11/06/2010  Please schedule at anytime with Nurse Health Advisor.  

## 2021-10-28 ENCOUNTER — Ambulatory Visit: Payer: Medicare Other | Admitting: Internal Medicine

## 2021-10-28 ENCOUNTER — Encounter: Payer: Self-pay | Admitting: Internal Medicine

## 2021-10-29 ENCOUNTER — Encounter: Payer: Self-pay | Admitting: *Deleted

## 2021-11-04 ENCOUNTER — Ambulatory Visit (INDEPENDENT_AMBULATORY_CARE_PROVIDER_SITE_OTHER): Payer: Medicare Other | Admitting: Internal Medicine

## 2021-11-04 ENCOUNTER — Other Ambulatory Visit: Payer: Self-pay

## 2021-11-04 ENCOUNTER — Encounter: Payer: Self-pay | Admitting: Internal Medicine

## 2021-11-04 VITALS — BP 126/76 | HR 67 | Temp 98.3°F | Resp 16 | Ht 70.0 in | Wt 210.4 lb

## 2021-11-04 DIAGNOSIS — Z23 Encounter for immunization: Secondary | ICD-10-CM | POA: Diagnosis not present

## 2021-11-04 DIAGNOSIS — D509 Iron deficiency anemia, unspecified: Secondary | ICD-10-CM | POA: Diagnosis not present

## 2021-11-04 DIAGNOSIS — H9193 Unspecified hearing loss, bilateral: Secondary | ICD-10-CM

## 2021-11-04 NOTE — Progress Notes (Signed)
Subjective:    Patient ID: Martin Rubio, male    DOB: 06-08-1946, 75 y.o.   MRN: [id number]  DOS:  11/04/2021 Type of visit - description: Acute  Needs a referral to audiology. Reports many years history of bilateral decreased hearing and also severe tinnitus. This was Dx at the [facility] and needs further evaluation.  Vaccines were also discussed.   Review of Systems See above   Past Medical History:  Diagnosis Date   Bronchitis    Diverticulosis    FH: BPH (benign prostatic hypertrophy)    History of Graves' disease 01/19/2018   S/p ablation   HTN (hypertension)    Hyperlipidemia    Obesity    Psoriatic arthritis (HCC)    Rheumatoid arthritis ([place]) 08/07/2014   Sleep apnea    CPAP   Thyroid disease     Past Surgical History:  Procedure Laterality Date   INGUINAL HERNIA REPAIR     bilateral   KNEE SURGERY     bilateral arthroscopic   l foot surgery Left 12/2020   PACEMAKER IMPLANT N/A 12/13/2019   Procedure: PACEMAKER IMPLANT;  Surgeon: Evans Lance, MD;  Location: [place] [facility];  Service: Cardiovascular;  Laterality: N/A;   TRANSURETHRAL RESECTION OF PROSTATE     UMBILICAL HERNIA REPAIR      Allergies as of 11/04/2021       Reactions   Codeine    Tension, nasty feeling   Nsaids Other (See Comments)   Renal insufficiency   Prednisone Other (See Comments)   Unknown/ sometimes takes with lower dose   Statins Other (See Comments)   Joint pain   Sulfa Antibiotics Other (See Comments)   Joint pain        Medication List        Accurate as of November 04, 2021  3:46 PM. If you have any questions, ask your nurse or doctor.          acetaminophen 325 MG tablet Commonly known as: TYLENOL Take 650 mg by mouth as needed for mild pain.   ALLERGY RELIEF PO Take 1 tablet by mouth as needed.   amLODipine 5 MG tablet Commonly known as: NORVASC Take 1 tablet (5 mg total) by mouth daily.   dofetilide 250 MCG capsule Commonly known as:  TIKOSYN Take 1 capsule (250 mcg total) by mouth 2 (two) times daily.   doxycycline 100 MG tablet Commonly known as: VIBRA-TABS Take 1 tablet (100 mg total) by mouth 2 (two) times daily.   Eliquis 5 MG Tabs tablet Generic drug: apixaban Take 1 tablet (5 mg total) by mouth 2 (two) times daily.   Enbrel 50 MG/ML injection Generic drug: etanercept Inject 50 mg into the skin once a week.   Flovent HFA 220 MCG/ACT inhaler Generic drug: fluticasone   fluticasone 50 MCG/ACT nasal spray Commonly known as: FLONASE Place 1 spray into both nostrils as needed for allergies.   FOLIC ACID PO Take by mouth.   gabapentin 100 MG capsule Commonly known as: NEURONTIN Take 1 capsule (100 mg total) by mouth 3 (three) times daily.   hydrOXYzine 25 MG capsule Commonly known as: VISTARIL Take 1 capsule (25 mg total) by mouth every 8 (eight) hours as needed.   levothyroxine 125 MCG tablet Commonly known as: SYNTHROID Take 1 tablet (125 mcg total) by mouth daily before breakfast.   losartan 50 MG tablet Commonly known as: COZAAR Take 1 tablet (50 mg total) by mouth in the morning and  at bedtime.   meclizine 25 MG tablet Commonly known as: ANTIVERT Take 1 tablet by mouth 3 (three) times daily as needed.   melatonin 5 MG Tabs Take 5 mg by mouth at bedtime.   omeprazole 20 MG capsule Commonly known as: PRILOSEC Take 20 mg by mouth daily. Buys the [employer] brand.   Praluent 150 MG/ML Soaj Generic drug: Alirocumab Inject 150 mg into the skin every 14 (fourteen) days.   predniSONE 5 MG tablet Commonly known as: DELTASONE Take by mouth as needed.   sucralfate 1 g tablet Commonly known as: CARAFATE 1 tablet on an empty stomach   tadalafil (PAH) 20 MG tablet Commonly known as: ADCIRCA Take 20 mg by mouth daily as needed.   tamsulosin 0.4 MG Caps capsule Commonly known as: FLOMAX Take 0.4 mg by mouth at bedtime.   triamcinolone cream 0.1 % Commonly known as: KENALOG Apply 1  application topically as needed for itching.   Vitamin B 12 500 MCG Tabs Take 1,000 mcg by mouth daily.   VITAMIN C ADULT GUMMIES PO Take by mouth. 1 gummies daily   Vitamin D 125 MCG (5000 UT) Caps Take 5,000 Units by mouth daily.           Objective:   Physical Exam BP 126/76 (BP Location: Left Arm, Patient Position: Sitting, Cuff Size: Small)   Pulse 67   Temp 98.3 F (36.8 C) (Oral)   Resp 16   Ht 5\' 10"  (1.778 m)   Wt 210 lb 6 oz (95.4 kg)   SpO2 97%   BMI 30.19 kg/m  General:   Well developed, NAD, BMI noted. HEENT:  Normocephalic . Face symmetric, atraumatic TMs normal, ear canals normal Neurologic:  alert & oriented X3.  Speech normal, gait appropriate for age and unassisted Psych--  Cognition and judgment appear intact.  Cooperative with normal attention span and concentration.  Behavior appropriate. No anxious or depressed appearing.      Assessment      ASSESSMENT  (transfer to me 12/2019) Hyperglycemia HTN High cholesterol, seen at the lipid clinic Hypothyroidism  Psoriatic Arthritis [name]  Osteoporosis: on fosamax rx by PCP, start holiday by mid 2022 (see visit note from 02/29/2020) CKD mild,stable . Creatinine ~1.5 CV: -Paroxysmal A. Fib, dx 09/2019 anticoagulated -Sick sinus syndrome, s/p pacemaker (12/13/2019) -Septal hypertrophy per echo -Left common iliac aneurysm, S/U cardiology OSA on CPAP BPH [name] [facility], tinnitus: Refer to audiology as requested High cholesterol: Last cholesterol not well controlled, cardiology is working on [employer]. Anemia: see LOV, seen by GI 09/01/2021. Pt reports is to have scopes 12-02-2021 Preventive care: Flu shot today Shingles and Tdap: Recommend to get at the pharmacy  This visit occurred during the SARS-CoV-2 public health emergency.  Safety protocols were in place, including screening questions prior to the visit, additional usage of staff PPE, and extensive cleaning  of exam room while observing appropriate contact time as indicated for disinfecting solutions.

## 2021-11-04 NOTE — Patient Instructions (Signed)
See you in January 6, change the date if needed

## 2021-11-05 NOTE — Assessment & Plan Note (Signed)
HOH, tinnitus: Refer to audiology as requested High cholesterol: Last cholesterol not well controlled, cardiology is working on [employer] cabin [employer]. Anemia: see LOV, seen by GI 09/01/2021. Pt reports is to have scopes 12-02-2021 Preventive care: Flu shot today Shingles and Tdap: Recommend to get at the pharmacy

## 2021-11-06 ENCOUNTER — Other Ambulatory Visit: Payer: Self-pay | Admitting: Cardiology

## 2021-11-06 DIAGNOSIS — I4891 Unspecified atrial fibrillation: Secondary | ICD-10-CM

## 2021-11-06 NOTE — Telephone Encounter (Signed)
Prescription refill request for Eliquis received. Indication:Afib Last office visit:10/22 Scr:1.5 Age: 75 Weight:95.4 kg  Prescription refilled

## 2021-11-10 ENCOUNTER — Encounter: Payer: Self-pay | Admitting: Cardiovascular Disease

## 2021-11-10 ENCOUNTER — Telehealth: Payer: Self-pay

## 2021-11-10 DIAGNOSIS — Z961 Presence of intraocular lens: Secondary | ICD-10-CM | POA: Diagnosis not present

## 2021-11-10 DIAGNOSIS — H04123 Dry eye syndrome of bilateral lacrimal glands: Secondary | ICD-10-CM | POA: Diagnosis not present

## 2021-11-10 NOTE — Telephone Encounter (Signed)
Left message for patient to return the call.

## 2021-11-10 NOTE — Telephone Encounter (Signed)
Called pt. No answer at this time, left message to return call.

## 2021-11-11 ENCOUNTER — Telehealth: Payer: Self-pay

## 2021-11-11 DIAGNOSIS — Z20822 Contact with and (suspected) exposure to covid-19: Secondary | ICD-10-CM | POA: Diagnosis not present

## 2021-11-11 NOTE — Telephone Encounter (Signed)
Called pt, no answer. Left message for patient to return the call.

## 2021-11-11 NOTE — Telephone Encounter (Signed)
Left message for patient to return the call.

## 2021-11-13 DIAGNOSIS — N138 Other obstructive and reflux uropathy: Secondary | ICD-10-CM | POA: Diagnosis not present

## 2021-11-13 DIAGNOSIS — N401 Enlarged prostate with lower urinary tract symptoms: Secondary | ICD-10-CM | POA: Diagnosis not present

## 2021-11-13 DIAGNOSIS — N529 Male erectile dysfunction, unspecified: Secondary | ICD-10-CM | POA: Diagnosis not present

## 2021-11-17 ENCOUNTER — Other Ambulatory Visit: Payer: Self-pay | Admitting: Podiatry

## 2021-11-17 NOTE — Telephone Encounter (Signed)
Please advise 

## 2021-11-21 ENCOUNTER — Other Ambulatory Visit: Payer: Self-pay | Admitting: Cardiovascular Disease

## 2021-11-21 ENCOUNTER — Other Ambulatory Visit: Payer: Self-pay | Admitting: Cardiology

## 2021-11-21 NOTE — Telephone Encounter (Signed)
Refill request

## 2021-11-26 DIAGNOSIS — Z20822 Contact with and (suspected) exposure to covid-19: Secondary | ICD-10-CM | POA: Diagnosis not present

## 2021-12-02 ENCOUNTER — Encounter: Payer: Self-pay | Admitting: Internal Medicine

## 2021-12-02 DIAGNOSIS — R1013 Epigastric pain: Secondary | ICD-10-CM | POA: Diagnosis not present

## 2021-12-02 DIAGNOSIS — Z87891 Personal history of nicotine dependence: Secondary | ICD-10-CM | POA: Diagnosis not present

## 2021-12-02 DIAGNOSIS — K648 Other hemorrhoids: Secondary | ICD-10-CM | POA: Diagnosis not present

## 2021-12-02 DIAGNOSIS — M069 Rheumatoid arthritis, unspecified: Secondary | ICD-10-CM | POA: Diagnosis not present

## 2021-12-02 DIAGNOSIS — D509 Iron deficiency anemia, unspecified: Secondary | ICD-10-CM | POA: Diagnosis not present

## 2021-12-02 DIAGNOSIS — N184 Chronic kidney disease, stage 4 (severe): Secondary | ICD-10-CM | POA: Diagnosis not present

## 2021-12-02 DIAGNOSIS — J4520 Mild intermittent asthma, uncomplicated: Secondary | ICD-10-CM | POA: Diagnosis not present

## 2021-12-02 DIAGNOSIS — K317 Polyp of stomach and duodenum: Secondary | ICD-10-CM | POA: Diagnosis not present

## 2021-12-02 DIAGNOSIS — Z9989 Dependence on other enabling machines and devices: Secondary | ICD-10-CM | POA: Diagnosis not present

## 2021-12-02 DIAGNOSIS — K219 Gastro-esophageal reflux disease without esophagitis: Secondary | ICD-10-CM | POA: Diagnosis not present

## 2021-12-02 DIAGNOSIS — E890 Postprocedural hypothyroidism: Secondary | ICD-10-CM | POA: Diagnosis not present

## 2021-12-02 DIAGNOSIS — K2950 Unspecified chronic gastritis without bleeding: Secondary | ICD-10-CM | POA: Diagnosis not present

## 2021-12-02 DIAGNOSIS — I480 Paroxysmal atrial fibrillation: Secondary | ICD-10-CM | POA: Diagnosis not present

## 2021-12-02 DIAGNOSIS — Z79899 Other long term (current) drug therapy: Secondary | ICD-10-CM | POA: Diagnosis not present

## 2021-12-02 DIAGNOSIS — I129 Hypertensive chronic kidney disease with stage 1 through stage 4 chronic kidney disease, or unspecified chronic kidney disease: Secondary | ICD-10-CM | POA: Diagnosis not present

## 2021-12-02 DIAGNOSIS — Z950 Presence of cardiac pacemaker: Secondary | ICD-10-CM | POA: Diagnosis not present

## 2021-12-02 DIAGNOSIS — Z7901 Long term (current) use of anticoagulants: Secondary | ICD-10-CM | POA: Diagnosis not present

## 2021-12-02 DIAGNOSIS — Z8616 Personal history of COVID-19: Secondary | ICD-10-CM | POA: Diagnosis not present

## 2021-12-02 DIAGNOSIS — K5730 Diverticulosis of large intestine without perforation or abscess without bleeding: Secondary | ICD-10-CM | POA: Diagnosis not present

## 2021-12-02 DIAGNOSIS — G4733 Obstructive sleep apnea (adult) (pediatric): Secondary | ICD-10-CM | POA: Diagnosis not present

## 2021-12-02 LAB — HM COLONOSCOPY

## 2021-12-08 ENCOUNTER — Other Ambulatory Visit: Payer: Self-pay | Admitting: Internal Medicine

## 2021-12-08 DIAGNOSIS — Z20822 Contact with and (suspected) exposure to covid-19: Secondary | ICD-10-CM | POA: Diagnosis not present

## 2021-12-11 ENCOUNTER — Ambulatory Visit (INDEPENDENT_AMBULATORY_CARE_PROVIDER_SITE_OTHER): Payer: Medicare Other

## 2021-12-11 DIAGNOSIS — I495 Sick sinus syndrome: Secondary | ICD-10-CM

## 2021-12-12 ENCOUNTER — Ambulatory Visit: Payer: Medicare Other | Admitting: Internal Medicine

## 2021-12-12 LAB — CUP PACEART REMOTE DEVICE CHECK
Battery Remaining Longevity: 136 mo
Battery Voltage: 3.02 V
Brady Statistic AP VP Percent: 17.62 %
Brady Statistic AP VS Percent: 48.67 %
Brady Statistic AS VP Percent: 0.42 %
Brady Statistic AS VS Percent: 33.29 %
Brady Statistic RA Percent Paced: 66.15 %
Brady Statistic RV Percent Paced: 18.03 %
Date Time Interrogation Session: 20230106112154
Implantable Lead Implant Date: 20210106
Implantable Lead Implant Date: 20210106
Implantable Lead Location: 753859
Implantable Lead Location: 753860
Implantable Lead Model: 3830
Implantable Lead Model: 5076
Implantable Pulse Generator Implant Date: 20210106
Lead Channel Impedance Value: 304 Ohm
Lead Channel Impedance Value: 361 Ohm
Lead Channel Impedance Value: 380 Ohm
Lead Channel Impedance Value: 551 Ohm
Lead Channel Pacing Threshold Amplitude: 0.5 V
Lead Channel Pacing Threshold Amplitude: 0.75 V
Lead Channel Pacing Threshold Pulse Width: 0.4 ms
Lead Channel Pacing Threshold Pulse Width: 0.4 ms
Lead Channel Sensing Intrinsic Amplitude: 2.125 mV
Lead Channel Sensing Intrinsic Amplitude: 2.125 mV
Lead Channel Sensing Intrinsic Amplitude: 31.625 mV
Lead Channel Sensing Intrinsic Amplitude: 31.625 mV
Lead Channel Setting Pacing Amplitude: 1.5 V
Lead Channel Setting Pacing Amplitude: 2.5 V
Lead Channel Setting Pacing Pulse Width: 0.4 ms
Lead Channel Setting Sensing Sensitivity: 2 mV

## 2021-12-16 ENCOUNTER — Ambulatory Visit (INDEPENDENT_AMBULATORY_CARE_PROVIDER_SITE_OTHER): Payer: Medicare Other | Admitting: Internal Medicine

## 2021-12-16 ENCOUNTER — Encounter: Payer: Self-pay | Admitting: Internal Medicine

## 2021-12-16 VITALS — BP 106/76 | HR 69 | Temp 98.5°F | Resp 18 | Ht 70.0 in | Wt 214.4 lb

## 2021-12-16 DIAGNOSIS — E039 Hypothyroidism, unspecified: Secondary | ICD-10-CM | POA: Diagnosis not present

## 2021-12-16 DIAGNOSIS — I10 Essential (primary) hypertension: Secondary | ICD-10-CM

## 2021-12-16 DIAGNOSIS — R739 Hyperglycemia, unspecified: Secondary | ICD-10-CM | POA: Diagnosis not present

## 2021-12-16 DIAGNOSIS — D509 Iron deficiency anemia, unspecified: Secondary | ICD-10-CM

## 2021-12-16 DIAGNOSIS — M159 Polyosteoarthritis, unspecified: Secondary | ICD-10-CM

## 2021-12-16 MED ORDER — DICLOFENAC SODIUM 1 % EX GEL: 4.0000 g | Freq: Two times a day (BID) | CUTANEOUS | 6 refills | Status: AC

## 2021-12-16 MED ORDER — FERROUS FUMARATE 325 (106 FE) MG PO TABS: 1.0000 | ORAL_TABLET | Freq: Two times a day (BID) | ORAL | 3 refills | Status: DC

## 2021-12-16 NOTE — Progress Notes (Signed)
Subjective:    Patient ID: Martin Rubio, male    DOB: 1946-01-24, 76 y.o.   MRN: [id number]  DOS:  12/16/2021 Type of visit - description: Follow-up  Today with talk about anemia, DJD, hypertension.  Also, c/o  pain at the base of the thumb, R>L. Denies any injury.  Joints are not red or warm. He also have chronic pain on the knees, developed back pain 2 weeks ago after leaning forward, it is getting better.  Denies paresthesias in the lower extremities.  History of anemia, chart reviewed.  Currently denies blood in the stools or abdominal pain.     Review of Systems Denies chest pain or difficulty breathing.  No edema  Past Medical History:  Diagnosis Date   Bronchitis    Diverticulosis    FH: BPH (benign prostatic hypertrophy)    History of Graves' disease 01/19/2018   S/p ablation   HTN (hypertension)    Hyperlipidemia    Obesity    Psoriatic arthritis ([place])    Rheumatoid arthritis ([place]) 08/07/2014   Sleep apnea    CPAP   Thyroid disease     Past Surgical History:  Procedure Laterality Date   INGUINAL HERNIA REPAIR     bilateral   KNEE SURGERY     bilateral arthroscopic   l foot surgery Left 12/2020   PACEMAKER IMPLANT N/A 12/13/2019   Procedure: PACEMAKER IMPLANT;  Surgeon: Evans Lance, MD;  Location: [place] [facility];  Service: Cardiovascular;  Laterality: N/A;   TRANSURETHRAL RESECTION OF PROSTATE     UMBILICAL HERNIA REPAIR      Current Outpatient Medications  Medication Instructions   acetaminophen (TYLENOL) 650 mg, Oral, As needed   amLODipine (NORVASC) 5 mg, Oral, Daily   Ascorbic Acid (VITAMIN C ADULT GUMMIES PO) Oral, 1 gummies daily   Chlorpheniramine Maleate (ALLERGY RELIEF PO) 1 tablet, Oral, As needed   diclofenac Sodium (VOLTAREN) 4 g, Topical, 2 times daily   dofetilide (TIKOSYN) 250 MCG capsule TAKE ONE CAPSULE BY MOUTH TWICE A DAY   ELIQUIS 5 MG TABS tablet TAKE 1 TABLET BY MOUTH TWICE A DAY   Enbrel 50 mg, Subcutaneous, Weekly    ferrous fumarate (HEMOCYTE - 106 MG FE) 325 (106 Fe) MG TABS tablet 106 mg of iron, Oral, 2 times daily   fluticasone (FLONASE) 50 MCG/ACT nasal spray 1 spray, Each Nare, As needed   fluticasone (FLOVENT HFA) 220 MCG/ACT inhaler    FOLIC ACID PO Oral   gabapentin (NEURONTIN) 100 mg, Oral, 3 times daily   hydrOXYzine (VISTARIL) 25 mg, Oral, Every 8 hours PRN   levothyroxine (SYNTHROID) 125 mcg, Oral, Daily before breakfast   losartan (COZAAR) 50 mg, Oral, 2 times daily   meclizine (ANTIVERT) 25 MG tablet 1 tablet, Oral, 3 times daily PRN   melatonin 5 mg, Oral, Daily at bedtime   omeprazole (PRILOSEC) 20 mg, Oral, Daily, Buys the [employer] brand.    Praluent 150 mg, Subcutaneous, Every 14 days   predniSONE (DELTASONE) 5 MG tablet Oral, As needed   sucralfate (CARAFATE) 1 g tablet 1 tablet on an empty stomach   tadalafil (PAH) (ADCIRCA) 20 mg, Oral, Daily PRN   tamsulosin (FLOMAX) 0.4 mg, Oral, Daily at bedtime   triamcinolone cream (KENALOG) 0.1 % 1 application, Topical, As needed   Vitamin B 12 1,000 mcg, Oral, Daily   Vitamin D 5,000 Units, Oral, Daily       Objective:   Physical Exam BP 106/76 (BP Location:  Left Arm, Patient Position: Sitting, Cuff Size: Normal)    Pulse 69    Temp 98.5 F (36.9 C) (Oral)    Resp 18    Ht 5\' 10"  (1.778 m)    Wt 214 lb 6 oz (97.2 kg)    SpO2 97%    BMI 30.76 kg/m  General:   Well developed, NAD, BMI noted. HEENT:  Normocephalic . Face symmetric, atraumatic Lungs:  CTA B Normal respiratory effort, no intercostal retractions, no accessory muscle use. Heart: RRR, trace systolic murmur Lower extremities: no pretibial edema bilaterally Hands: Bony changes consistent with mild DJD.  No synovitis Skin: Not pale. Not jaundice Neurologic:  alert & oriented X3.  Speech normal, gait appropriate for age and unassisted Psych--  Cognition and judgment appear intact.  Cooperative with normal attention span and concentration.  Behavior appropriate. No  anxious or depressed appearing.      Assessment    ASSESSMENT  (transfer to me 12/2019) Hyperglycemia HTN High cholesterol, seen at the lipid clinic Hypothyroidism  Psoriatic Arthritis [name]  Osteoporosis: on fosamax rx by PCP, start holiday by mid 2022 (see visit note from 02/29/2020) CKD mild,stable . Creatinine ~1.5 CV: -Paroxysmal A. Fib, dx 09/2019 anticoagulated -Sick sinus syndrome, s/p pacemaker (12/13/2019) -Septal hypertrophy per echo -Left common iliac aneurysm, S/U cardiology OSA on CPAP BPH [name] [facility]  Iron deficiency anemia: 12/02/2021 >> EGD (several polyps suggestive of FGP), C-scope:no polyps, no cancer. Pathology: benign   PLAN Hyperglycemia: Check A1c HTN: BP in the low side, recommend to monitor BPs, if consistently low let me know.  Continue Losartan, amlodipine, check BMP Iron deficiency anemia: Saw GI 09/01/2021, procedures done 12/02/2021 EGD (several polyps suggestive of FGP) C-scope:no polyps, no cancer. Pathology:  Small intestine: Unremarkable.  No celiac disease. Mild chronic gastritis, no H. pylori Plan: Recheck CBC, start iron twice daily, see AVS DJD: On multiple sites, recommend only Tylenol and Voltaren gel, Rx sent. Hypothyroidism: Check TSH Preventive care: Recommend COVID-vaccine. RTC 4 months    This visit occurred during the SARS-CoV-2 public health emergency.  Safety protocols were in place, including screening questions prior to the visit, additional usage of staff PPE, and extensive cleaning of exam room while observing appropriate contact time as indicated for disinfecting solutions.

## 2021-12-16 NOTE — Assessment & Plan Note (Signed)
Hyperglycemia: Check A1c HTN: BP in the low side, recommend to monitor BPs, if consistently low let me know.  Continue Losartan, amlodipine, check BMP Iron deficiency anemia: Saw GI 09/01/2021, procedures done 12/02/2021 EGD (several polyps suggestive of FGP) C-scope:no polyps, no cancer. Pathology:  Small intestine: Unremarkable.  No celiac disease. Mild chronic gastritis, no H. pylori Plan: Recheck CBC, start iron twice daily, see AVS DJD: On multiple sites, recommend only Tylenol and Voltaren gel, Rx sent. Hypothyroidism: Check TSH Preventive care: Recommend COVID-vaccine. RTC 4 months

## 2021-12-16 NOTE — Patient Instructions (Addendum)
Your blood pressure is low today, continue the same medications, drink plenty of water and check the  blood pressure regularly BP GOAL is between 110/65 and  135/85. If it is consistently higher or lower, let me know  Start taking iron 325 mg: 1 tablet twice daily before 1 before breakfast and 1 before dinner (you would like to take vitamin C supplement with the morning dose to increase absorption).  Please consider COVID-vaccine  GO TO THE LAB : Get the blood work     [place], [place] back for checkup is 4 months

## 2021-12-17 LAB — CBC WITH DIFFERENTIAL/PLATELET
Basophils Absolute: 0.1 10*3/uL (ref 0.0–0.1)
Basophils Relative: 1.1 % (ref 0.0–3.0)
Eosinophils Absolute: 0.3 10*3/uL (ref 0.0–0.7)
Eosinophils Relative: 3.3 % (ref 0.0–5.0)
HCT: 37.8 % — ABNORMAL LOW (ref 39.0–52.0)
Hemoglobin: 11.7 g/dL — ABNORMAL LOW (ref 13.0–17.0)
Lymphocytes Relative: 22.7 % (ref 12.0–46.0)
Lymphs Abs: 1.8 10*3/uL (ref 0.7–4.0)
MCHC: 31 g/dL (ref 30.0–36.0)
MCV: 69.7 fl — ABNORMAL LOW (ref 78.0–100.0)
Monocytes Absolute: 0.9 10*3/uL (ref 0.1–1.0)
Monocytes Relative: 11.7 % (ref 3.0–12.0)
Neutro Abs: 4.8 10*3/uL (ref 1.4–7.7)
Neutrophils Relative %: 61.2 % (ref 43.0–77.0)
Platelets: 165 10*3/uL (ref 150.0–400.0)
RBC: 5.42 Mil/uL (ref 4.22–5.81)
RDW: 19 % — ABNORMAL HIGH (ref 11.5–15.5)
WBC: 7.9 10*3/uL (ref 4.0–10.5)

## 2021-12-17 LAB — BASIC METABOLIC PANEL
BUN: 24 mg/dL — ABNORMAL HIGH (ref 6–23)
CO2: 28 mEq/L (ref 19–32)
Calcium: 9.4 mg/dL (ref 8.4–10.5)
Chloride: 103 mEq/L (ref 96–112)
Creatinine, Ser: 1.63 mg/dL — ABNORMAL HIGH (ref 0.40–1.50)
GFR: 40.53 mL/min — ABNORMAL LOW (ref >60.00)
Glucose, Bld: 83 mg/dL (ref 70–99)
Potassium: 4.4 mEq/L (ref 3.5–5.1)
Sodium: 138 mEq/L (ref 135–145)

## 2021-12-17 LAB — HEMOGLOBIN A1C: Hgb A1c MFr Bld: 5.9 % (ref 4.6–6.5)

## 2021-12-17 LAB — TSH: TSH: 12.35 u[IU]/mL — ABNORMAL HIGH (ref 0.35–5.50)

## 2021-12-19 MED ORDER — LEVOTHYROXINE SODIUM 150 MCG PO TABS: 150.0000 ug | ORAL_TABLET | Freq: Every day | ORAL | 1 refills | Status: DC

## 2021-12-19 NOTE — Addendum Note (Signed)
Addended byDamita Dunnings D on: 12/19/2021 01:34 PM   Modules accepted: Orders

## 2021-12-22 NOTE — Progress Notes (Signed)
Remote pacemaker transmission.   

## 2022-01-06 ENCOUNTER — Ambulatory Visit (INDEPENDENT_AMBULATORY_CARE_PROVIDER_SITE_OTHER): Payer: Medicare Other | Admitting: Cardiovascular Disease

## 2022-01-06 ENCOUNTER — Encounter: Payer: Self-pay | Admitting: Cardiovascular Disease

## 2022-01-06 ENCOUNTER — Other Ambulatory Visit: Payer: Self-pay

## 2022-01-06 VITALS — BP 128/80 | HR 60 | Ht 64.0 in | Wt 205.0 lb

## 2022-01-06 DIAGNOSIS — I739 Peripheral vascular disease, unspecified: Secondary | ICD-10-CM

## 2022-01-06 DIAGNOSIS — I480 Paroxysmal atrial fibrillation: Secondary | ICD-10-CM | POA: Diagnosis not present

## 2022-01-06 DIAGNOSIS — I10 Essential (primary) hypertension: Secondary | ICD-10-CM | POA: Diagnosis not present

## 2022-01-06 DIAGNOSIS — E785 Hyperlipidemia, unspecified: Secondary | ICD-10-CM

## 2022-01-06 NOTE — Progress Notes (Signed)
Cardiology Office Note   Date:  01/06/2022   ID:  Martin Rubio, DOB 1945/12/17, MRN [id number]  PCP:  Colon Branch, MD  Cardiologist: Dr. [name]  No chief complaint on file.      History of Present Illness: Martin Rubio is a 76 y.o. male who is here today for a follow-up visit regarding peripheral arterial disease and iliac artery aneurysms.   He has known history of essential hypertension, paroxysmal atrial fibrillation/flutter, bradycardia status post pacemaker placement, psoriatic arthritis on Enbrel, carotid disease and hyperlipidemia.  He has history of remote smoking but quit more than 45 years ago.  He is not diabetic. The patient has been followed for iliac aneurysms.  He has known history of iliac aneurysm for many years since at least 2016.  At that time, the size was 1.7 cm in diameter.   Most recent duplex in October 2022 showed that the aneurysm in the right common iliac artery measured 2.1 cm with significant stenosis and velocity of 425.  On the left side, the aneurysm measured 2.3 cm with significant stenosis in the proximal left common iliac artery with peak velocity of 500.  ABI in December 2021 was normal on the right and mildly reduced on the left at 0.85.  No significant infrainguinal disease was noted by duplex. He now reports worsening bilateral hip and leg claudication which is happening with minimal distance walking.  He has cramping at night.  No chest pain or shortness of breath.   Past Medical History:  Diagnosis Date   Bronchitis    Diverticulosis    FH: BPH (benign prostatic hypertrophy)    History of Graves' disease 01/19/2018   S/p ablation   HTN (hypertension)    Hyperlipidemia    Obesity    Psoriatic arthritis (HCC)    Rheumatoid arthritis ([place]) 08/07/2014   Sleep apnea    CPAP   Thyroid disease     Past Surgical History:  Procedure Laterality Date   INGUINAL HERNIA REPAIR     bilateral   KNEE SURGERY     bilateral arthroscopic    l foot surgery Left 12/2020   PACEMAKER IMPLANT N/A 12/13/2019   Procedure: PACEMAKER IMPLANT;  Surgeon: Evans Lance, MD;  Location: [place] [facility];  Service: Cardiovascular;  Laterality: N/A;   TRANSURETHRAL RESECTION OF PROSTATE     UMBILICAL HERNIA REPAIR       Current Outpatient Medications  Medication Sig Dispense Refill   acetaminophen (TYLENOL) 325 MG tablet Take 650 mg by mouth as needed for mild pain.      amLODipine (NORVASC) 5 MG tablet TAKE 1 TABLET (5 MG TOTAL) BY MOUTH DAILY. 90 tablet 3   Ascorbic Acid (VITAMIN C ADULT GUMMIES PO) Take by mouth. 1 gummies daily     Cholecalciferol (VITAMIN D) 125 MCG (5000 UT) CAPS Take 5,000 Units by mouth daily.      Cyanocobalamin (VITAMIN B 12) 500 MCG TABS Take 1,000 mcg by mouth daily.      diclofenac Sodium (VOLTAREN) 1 % GEL Apply 4 g topically in the morning and at bedtime. 100 g 6   dofetilide (TIKOSYN) 250 MCG capsule TAKE ONE CAPSULE BY MOUTH TWICE A DAY 180 capsule 2   ELIQUIS 5 MG TABS tablet TAKE 1 TABLET BY MOUTH TWICE A DAY 180 tablet 1   etanercept (ENBREL) 50 MG/ML injection Inject 50 mg into the skin once a week.     ferrous fumarate (HEMOCYTE -  106 MG FE) 325 (106 Fe) MG TABS tablet Take 1 tablet (106 mg of iron total) by mouth 2 (two) times daily. 60 tablet 3   FOLIC ACID PO Take by mouth.     hydrOXYzine (VISTARIL) 25 MG capsule TAKE 1 CAPSULE (25 MG TOTAL) BY MOUTH EVERY 8 (EIGHT) HOURS AS NEEDED. 60 capsule 1   levothyroxine (SYNTHROID) 150 MCG tablet Take 1 tablet (150 mcg total) by mouth daily before breakfast. 30 tablet 1   losartan (COZAAR) 50 MG tablet TAKE 1 TABLET (50 MG TOTAL) BY MOUTH IN THE MORNING AND AT BEDTIME. 180 tablet 3   meclizine (ANTIVERT) 25 MG tablet Take 1 tablet by mouth 3 (three) times daily as needed.     Melatonin 5 MG TABS Take 5 mg by mouth at bedtime.     omeprazole (PRILOSEC) 20 MG capsule Take 20 mg by mouth daily. Buys the [employer] brand.     tadalafil, PAH, (ADCIRCA) 20 MG  tablet Take 20 mg by mouth daily as needed.     tamsulosin (FLOMAX) 0.4 MG CAPS capsule Take 0.4 mg by mouth at bedtime.      triamcinolone cream (KENALOG) 0.1 % Apply 1 application topically as needed for itching.      Alirocumab (PRALUENT) 150 MG/ML SOAJ Inject 150 mg into the skin every 14 (fourteen) days. (Patient not taking: Reported on 01/06/2022) 2 mL 11   Chlorpheniramine Maleate (ALLERGY RELIEF PO) Take 1 tablet by mouth as needed. (Patient not taking: Reported on 01/06/2022)     fluticasone (FLONASE) 50 MCG/ACT nasal spray Place 1 spray into both nostrils as needed for allergies.  (Patient not taking: Reported on 01/06/2022)     gabapentin (NEURONTIN) 100 MG capsule Take 1 capsule (100 mg total) by mouth 3 (three) times daily. (Patient not taking: Reported on 01/06/2022) 90 capsule 1   No current facility-administered medications for this visit.    Allergies:   Codeine, Nsaids, Prednisone, Statins, and Sulfa antibiotics    Social History:  The patient  reports that he has quit smoking. His smoking use included cigarettes. He has never used smokeless tobacco. He reports that he does not drink alcohol and does not use drugs.   Family History:  The patient's family history includes CAD (age of onset: 72) in his mother; CAD (age of onset: 68) in his father; Diabetes in his brother.    ROS:  Please see the history of present illness.   Otherwise, review of systems are positive for none.   All other systems are reviewed and negative.    PHYSICAL EXAM: VS:  BP 128/80    Pulse 60    Ht 5\' 4"  (1.626 m)    Wt 205 lb (93 kg)    SpO2 98%    BMI 35.19 kg/m  , BMI Body mass index is 35.19 kg/m. GEN: Well nourished, well developed, in no acute distress  HEENT: normal  Neck: no JVD, carotid bruits, or masses Cardiac: RRR; no murmurs, rubs, or gallops,no edema  Respiratory:  clear to auscultation bilaterally, normal work of breathing GI: soft, nontender, nondistended, + BS MS: no deformity or  atrophy  Skin: warm and dry, no rash Neuro:  Strength and sensation are intact Psych: euthymic mood, full affect Vascular: Femoral pulse: normal on the right and slightly diminished on the left.   EKG:  EKG is not ordered today.    Recent Labs: 01/08/2021: Magnesium 1.9 08/08/2021: ALT 14 12/16/2021: BUN 24; Creatinine, Ser 1.63; Hemoglobin 11.7;  Platelets 165.0; Potassium 4.4; Sodium 138; TSH 12.35    Lipid Panel    Component Value Date/Time   CHOL 237 (H) 08/08/2021 1002   CHOL 142 01/30/2020 1018   TRIG 144.0 08/08/2021 1002   HDL 42.70 08/08/2021 1002   HDL 40 01/30/2020 1018   CHOLHDL 6 08/08/2021 1002   VLDL 28.8 08/08/2021 1002   LDLCALC 166 (H) 08/08/2021 1002   LDLCALC 72 01/30/2020 1018      Wt Readings from Last 3 Encounters:  01/06/22 205 lb (93 kg)  12/16/21 214 lb 6 oz (97.2 kg)  11/04/21 210 lb 6 oz (95.4 kg)        No flowsheet data found.    ASSESSMENT AND PLAN:  1.  Peripheral arterial disease with bilateral common iliac artery aneurysm: He is now having worsening bilateral leg claudication likely due to progression of common iliac artery disease.  His symptoms are now lifestyle limiting and due to that, I recommend proceeding with abdominal aortogram with lower extremity angiography and possible endovascular intervention.  He does have chronic kidney disease and thus will bring in 4 hours before the procedure for hydration.  Aneurysm size is not to the threshold of requiring repair yet but that might have to be considered if his occlusive disease cannot be treated without stenting.  Previous duplex showed no significant infrainguinal disease. Planned access is via the right common femoral artery.  I discussed the procedure in details as well as risks and benefits. Hold Eliquis 2 days before the procedure to minimize the risk of bleeding.  2.  Paroxysmal atrial fibrillation on dofetilide: Maintaining sinus rhythm.  He is on long-term anticoagulation  with Eliquis which will be held 2 days before the procedure.  3. Essential hypertension: Blood pressure is well controlled on current medications.  4.  Hyperlipidemia: He is currently on Praluent with known intolerance to statins.    Disposition: Proceed with an angiogram and follow-up few weeks after.  Signed,  Kathlyn Sacramento, MD  01/06/2022 1:08 PM    [place]

## 2022-01-06 NOTE — Patient Instructions (Signed)
Medication Instructions:  No changes *If you need a refill on your cardiac medications before your next appointment, please call your pharmacy*  Testing/Procedures: Your physician has requested that you have a peripheral vascular angiogram. This exam is performed at the hospital. During this exam IV contrast is used to look at arterial blood flow. Please review the information sheet given for details.   Follow-Up: At [facility], you and your health needs are our priority.  As part of our continuing mission to provide you with exceptional heart care, we have created designated Provider Care Teams.  These Care Teams include your primary Cardiologist (physician) and Advanced Practice Providers (APPs -  Physician Assistants and Nurse Practitioners) who all work together to provide you with the care you need, when you need it.  We recommend signing up for the patient portal called "MyChart".  Sign up information is provided on this After Visit Summary.  MyChart is used to connect with patients for Virtual Visits (Telemedicine).  Patients are able to view lab/test results, encounter notes, upcoming appointments, etc.  Non-urgent messages can be sent to your provider as well.   To learn more about what you can do with MyChart, go to [email or web address].    Your next appointment:   Keep your follow up with Dr. Fletcher Anon on 3/14 at 8:40 am   [address] [address] Dept: [phone] Loc: [place]  01/06/2022  You are scheduled for a Peripheral Angiogram on Wednesday, February 8 with Dr. Kathlyn Sacramento.  1. Please arrive at the [facility] (Main Entrance A) at [facility]: [address] [place], [place] [address] at 8:00 AM (This time is four hours before your procedure to ensure your preparation). Free valet parking service is available.   Special note:  Every effort is made to have your procedure done on time. Please understand that emergencies sometimes delay scheduled procedures.  2. Diet: Do not eat solid foods after midnight.  The patient may have clear liquids until 5am upon the day of the procedure.  3. Labs: You will need to have blood drawn on completed on 12/16/21  4. Medication instructions in preparation for your procedure: Hold the Eliquis two days prior to the procedure (hold 2/6 and 2/7)   On the morning of your procedure, take your Aspirin and any morning medicines NOT listed above.  You may use sips of water.  5. Plan for one night stay--bring personal belongings. 6. Bring a current list of your medications and current insurance cards. 7. You MUST have a responsible person to drive you home. 8. Someone MUST be with you the first 24 hours after you arrive home or your discharge will be delayed. 9. Please wear clothes that are easy to get on and off and wear slip-on shoes.  Thank you for allowing [place] to care for you!   -- [facility]  *If you need a refill on your cardiac medications before your next appointment, please call your pharmacy*

## 2022-01-06 NOTE — H&P (View-Only) (Signed)
Cardiology Office Note   Date:  01/06/2022   ID:  Martin Rubio, DOB 11-01-1946, MRN [id number]  PCP:  Colon Branch, MD  Cardiologist: Dr. [name]  No chief complaint on file.      History of Present Illness: Martin Rubio is a 76 y.o. male who is here today for a follow-up visit regarding peripheral arterial disease and iliac artery aneurysms.   He has known history of essential hypertension, paroxysmal atrial fibrillation/flutter, bradycardia status post pacemaker placement, psoriatic arthritis on Enbrel, carotid disease and hyperlipidemia.  He has history of remote smoking but quit more than 45 years ago.  He is not diabetic. The patient has been followed for iliac aneurysms.  He has known history of iliac aneurysm for many years since at least 2016.  At that time, the size was 1.7 cm in diameter.   Most recent duplex in October 2022 showed that the aneurysm in the right common iliac artery measured 2.1 cm with significant stenosis and velocity of 425.  On the left side, the aneurysm measured 2.3 cm with significant stenosis in the proximal left common iliac artery with peak velocity of 500.  ABI in December 2021 was normal on the right and mildly reduced on the left at 0.85.  No significant infrainguinal disease was noted by duplex. He now reports worsening bilateral hip and leg claudication which is happening with minimal distance walking.  He has cramping at night.  No chest pain or shortness of breath.   Past Medical History:  Diagnosis Date   Bronchitis    Diverticulosis    FH: BPH (benign prostatic hypertrophy)    History of Graves' disease 01/19/2018   S/p ablation   HTN (hypertension)    Hyperlipidemia    Obesity    Psoriatic arthritis (HCC)    Rheumatoid arthritis ([place]) 08/07/2014   Sleep apnea    CPAP   Thyroid disease     Past Surgical History:  Procedure Laterality Date   INGUINAL HERNIA REPAIR     bilateral   KNEE SURGERY     bilateral arthroscopic    l foot surgery Left 12/2020   PACEMAKER IMPLANT N/A 12/13/2019   Procedure: PACEMAKER IMPLANT;  Surgeon: Evans Lance, MD;  Location: [facility] [facility];  Service: Cardiovascular;  Laterality: N/A;   TRANSURETHRAL RESECTION OF PROSTATE     UMBILICAL HERNIA REPAIR       Current Outpatient Medications  Medication Sig Dispense Refill   acetaminophen (TYLENOL) 325 MG tablet Take 650 mg by mouth as needed for mild pain.      amLODipine (NORVASC) 5 MG tablet TAKE 1 TABLET (5 MG TOTAL) BY MOUTH DAILY. 90 tablet 3   Ascorbic Acid (VITAMIN C ADULT GUMMIES PO) Take by mouth. 1 gummies daily     Cholecalciferol (VITAMIN D) 125 MCG (5000 UT) CAPS Take 5,000 Units by mouth daily.      Cyanocobalamin (VITAMIN B 12) 500 MCG TABS Take 1,000 mcg by mouth daily.      diclofenac Sodium (VOLTAREN) 1 % GEL Apply 4 g topically in the morning and at bedtime. 100 g 6   dofetilide (TIKOSYN) 250 MCG capsule TAKE ONE CAPSULE BY MOUTH TWICE A DAY 180 capsule 2   ELIQUIS 5 MG TABS tablet TAKE 1 TABLET BY MOUTH TWICE A DAY 180 tablet 1   etanercept (ENBREL) 50 MG/ML injection Inject 50 mg into the skin once a week.     ferrous fumarate (HEMOCYTE -  106 MG FE) 325 (106 Fe) MG TABS tablet Take 1 tablet (106 mg of iron total) by mouth 2 (two) times daily. 60 tablet 3   FOLIC ACID PO Take by mouth.     hydrOXYzine (VISTARIL) 25 MG capsule TAKE 1 CAPSULE (25 MG TOTAL) BY MOUTH EVERY 8 (EIGHT) HOURS AS NEEDED. 60 capsule 1   levothyroxine (SYNTHROID) 150 MCG tablet Take 1 tablet (150 mcg total) by mouth daily before breakfast. 30 tablet 1   losartan (COZAAR) 50 MG tablet TAKE 1 TABLET (50 MG TOTAL) BY MOUTH IN THE MORNING AND AT BEDTIME. 180 tablet 3   meclizine (ANTIVERT) 25 MG tablet Take 1 tablet by mouth 3 (three) times daily as needed.     Melatonin 5 MG TABS Take 5 mg by mouth at bedtime.     omeprazole (PRILOSEC) 20 MG capsule Take 20 mg by mouth daily. Buys the [employer] brand.     tadalafil, PAH, (ADCIRCA) 20 MG  tablet Take 20 mg by mouth daily as needed.     tamsulosin (FLOMAX) 0.4 MG CAPS capsule Take 0.4 mg by mouth at bedtime.      triamcinolone cream (KENALOG) 0.1 % Apply 1 application topically as needed for itching.      Alirocumab (PRALUENT) 150 MG/ML SOAJ Inject 150 mg into the skin every 14 (fourteen) days. (Patient not taking: Reported on 01/06/2022) 2 mL 11   Chlorpheniramine Maleate (ALLERGY RELIEF PO) Take 1 tablet by mouth as needed. (Patient not taking: Reported on 01/06/2022)     fluticasone (FLONASE) 50 MCG/ACT nasal spray Place 1 spray into both nostrils as needed for allergies.  (Patient not taking: Reported on 01/06/2022)     gabapentin (NEURONTIN) 100 MG capsule Take 1 capsule (100 mg total) by mouth 3 (three) times daily. (Patient not taking: Reported on 01/06/2022) 90 capsule 1   No current facility-administered medications for this visit.    Allergies:   Codeine, Nsaids, Prednisone, Statins, and Sulfa antibiotics    Social History:  The patient  reports that he has quit smoking. His smoking use included cigarettes. He has never used smokeless tobacco. He reports that he does not drink alcohol and does not use drugs.   Family History:  The patient's family history includes CAD (age of onset: 32) in his mother; CAD (age of onset: 77) in his father; Diabetes in his brother.    ROS:  Please see the history of present illness.   Otherwise, review of systems are positive for none.   All other systems are reviewed and negative.    PHYSICAL EXAM: VS:  BP 128/80    Pulse 60    Ht 5\' 4"  (1.626 m)    Wt 205 lb (93 kg)    SpO2 98%    BMI 35.19 kg/m  , BMI Body mass index is 35.19 kg/m. GEN: Well nourished, well developed, in no acute distress  HEENT: normal  Neck: no JVD, carotid bruits, or masses Cardiac: RRR; no murmurs, rubs, or gallops,no edema  Respiratory:  clear to auscultation bilaterally, normal work of breathing GI: soft, nontender, nondistended, + BS MS: no deformity or  atrophy  Skin: warm and dry, no rash Neuro:  Strength and sensation are intact Psych: euthymic mood, full affect Vascular: Femoral pulse: normal on the right and slightly diminished on the left.   EKG:  EKG is not ordered today.    Recent Labs: 01/08/2021: Magnesium 1.9 08/08/2021: ALT 14 12/16/2021: BUN 24; Creatinine, Ser 1.63; Hemoglobin 11.7;  Platelets 165.0; Potassium 4.4; Sodium 138; TSH 12.35    Lipid Panel    Component Value Date/Time   CHOL 237 (H) 08/08/2021 1002   CHOL 142 01/30/2020 1018   TRIG 144.0 08/08/2021 1002   HDL 42.70 08/08/2021 1002   HDL 40 01/30/2020 1018   CHOLHDL 6 08/08/2021 1002   VLDL 28.8 08/08/2021 1002   LDLCALC 166 (H) 08/08/2021 1002   LDLCALC 72 01/30/2020 1018      Wt Readings from Last 3 Encounters:  01/06/22 205 lb (93 kg)  12/16/21 214 lb 6 oz (97.2 kg)  11/04/21 210 lb 6 oz (95.4 kg)        No flowsheet data found.    ASSESSMENT AND PLAN:  1.  Peripheral arterial disease with bilateral common iliac artery aneurysm: He is now having worsening bilateral leg claudication likely due to progression of common iliac artery disease.  His symptoms are now lifestyle limiting and due to that, I recommend proceeding with abdominal aortogram with lower extremity angiography and possible endovascular intervention.  He does have chronic kidney disease and thus will bring in 4 hours before the procedure for hydration.  Aneurysm size is not to the threshold of requiring repair yet but that might have to be considered if his occlusive disease cannot be treated without stenting.  Previous duplex showed no significant infrainguinal disease. Planned access is via the right common femoral artery.  I discussed the procedure in details as well as risks and benefits. Hold Eliquis 2 days before the procedure to minimize the risk of bleeding.  2.  Paroxysmal atrial fibrillation on dofetilide: Maintaining sinus rhythm.  He is on long-term anticoagulation  with Eliquis which will be held 2 days before the procedure.  3. Essential hypertension: Blood pressure is well controlled on current medications.  4.  Hyperlipidemia: He is currently on Praluent with known intolerance to statins.    Disposition: Proceed with an angiogram and follow-up few weeks after.  Signed,  Kathlyn Sacramento, MD  01/06/2022 1:08 PM    [place]

## 2022-01-07 DIAGNOSIS — M545 Low back pain, unspecified: Secondary | ICD-10-CM | POA: Diagnosis not present

## 2022-01-13 ENCOUNTER — Telehealth: Payer: Self-pay | Admitting: *Deleted

## 2022-01-13 NOTE — Telephone Encounter (Signed)
Patient reports he usually takes Tikosyn every morning at 9 AM. I have asked patient to take 01/14/22 Tikosyn 9 AM dose with him to hospital, let nursing staff know he has with him and should take at 9 AM 01/14/22.

## 2022-01-13 NOTE — Telephone Encounter (Signed)
Abdominal aortogram  scheduled at [facility] for: Wednesday January 14, 2022 12:30 PM [facility] Main Entrance A [facility]) at: 7:30 AM-pre-procedure hydration   Diet-no solid food after midnight prior to cath, clear liquids until 5 AM day of procedure.   Medication instructions for procedure: -Hold:  Eliquis-none 01/12/22 until post procedure -Except hold medications usual morning medications can be taken pre-cath with sips of water including aspirin 81 mg.    Must have responsible adult to drive home post procedure and be with patient first 24 hours after arriving home.  [facility] does allow one visitor to accompany you and wait in the hospital waiting room while you are there for your procedure. You and your visitor will be asked to wear a mask once you enter the hospital.   Patient reports does not currently have any new symptoms concerning for COVID-19 and no household members with COVID-19 like illness.     Reviewed procedure instructions with patient.

## 2022-01-14 ENCOUNTER — Other Ambulatory Visit: Payer: Self-pay

## 2022-01-14 ENCOUNTER — Encounter (HOSPITAL_COMMUNITY)
Admission: RE | Disposition: A | Discharge status: Home / Self Care | Payer: Self-pay | Source: Home / Self Care | Attending: Cardiovascular Disease

## 2022-01-14 ENCOUNTER — Ambulatory Visit (HOSPITAL_COMMUNITY)
Admission: RE | Admit: 2022-01-14 | Discharge: 2022-01-14 | Disposition: A | Discharge status: Home / Self Care | Payer: Medicare Other | Attending: Cardiovascular Disease | Admitting: Cardiovascular Disease

## 2022-01-14 DIAGNOSIS — E785 Hyperlipidemia, unspecified: Secondary | ICD-10-CM | POA: Insufficient documentation

## 2022-01-14 DIAGNOSIS — I739 Peripheral vascular disease, unspecified: Secondary | ICD-10-CM | POA: Insufficient documentation

## 2022-01-14 DIAGNOSIS — Z950 Presence of cardiac pacemaker: Secondary | ICD-10-CM | POA: Insufficient documentation

## 2022-01-14 DIAGNOSIS — I129 Hypertensive chronic kidney disease with stage 1 through stage 4 chronic kidney disease, or unspecified chronic kidney disease: Secondary | ICD-10-CM | POA: Diagnosis not present

## 2022-01-14 DIAGNOSIS — L4050 Arthropathic psoriasis, unspecified: Secondary | ICD-10-CM | POA: Insufficient documentation

## 2022-01-14 DIAGNOSIS — Z87891 Personal history of nicotine dependence: Secondary | ICD-10-CM | POA: Diagnosis not present

## 2022-01-14 DIAGNOSIS — I723 Aneurysm of iliac artery: Secondary | ICD-10-CM | POA: Diagnosis not present

## 2022-01-14 DIAGNOSIS — I480 Paroxysmal atrial fibrillation: Secondary | ICD-10-CM | POA: Diagnosis not present

## 2022-01-14 DIAGNOSIS — Z7901 Long term (current) use of anticoagulants: Secondary | ICD-10-CM | POA: Diagnosis not present

## 2022-01-14 DIAGNOSIS — N189 Chronic kidney disease, unspecified: Secondary | ICD-10-CM | POA: Insufficient documentation

## 2022-01-14 DIAGNOSIS — Z79899 Other long term (current) drug therapy: Secondary | ICD-10-CM | POA: Insufficient documentation

## 2022-01-14 HISTORY — PX: ABDOMINAL AORTOGRAM W/LOWER EXTREMITY: CATH118223

## 2022-01-14 SURGERY — ABDOMINAL AORTOGRAM W/LOWER EXTREMITY
Anesthesia: LOCAL

## 2022-01-14 MED ORDER — FENTANYL CITRATE (PF) 100 MCG/2ML IJ SOLN
INTRAMUSCULAR | Status: AC
  Filled 2022-01-14: qty 2

## 2022-01-14 MED ORDER — HEPARIN (PORCINE) IN NACL 1000-0.9 UT/500ML-% IV SOLN
INTRAVENOUS | Status: AC
  Filled 2022-01-14: qty 500

## 2022-01-14 MED ORDER — SODIUM CHLORIDE 0.9% FLUSH: 3.0000 mL | Freq: Two times a day (BID) | INTRAVENOUS | Status: DC

## 2022-01-14 MED ORDER — ASPIRIN 81 MG PO CHEW: 81.0000 mg | CHEWABLE_TABLET | ORAL | Status: DC

## 2022-01-14 MED ORDER — ONDANSETRON HCL 4 MG/2ML IJ SOLN: 4.0000 mg | Freq: Four times a day (QID) | INTRAMUSCULAR | Status: DC | PRN

## 2022-01-14 MED ORDER — FENTANYL CITRATE (PF) 100 MCG/2ML IJ SOLN
INTRAMUSCULAR | Status: DC | PRN
  Administered 2022-01-14 (×2): 50 ug via INTRAVENOUS

## 2022-01-14 MED ORDER — MIDAZOLAM HCL 2 MG/2ML IJ SOLN
INTRAMUSCULAR | Status: DC | PRN
  Administered 2022-01-14 (×2): 1 mg via INTRAVENOUS

## 2022-01-14 MED ORDER — HEPARIN (PORCINE) IN NACL 1000-0.9 UT/500ML-% IV SOLN
INTRAVENOUS | Status: DC | PRN
  Administered 2022-01-14 (×2): 500 mL

## 2022-01-14 MED ORDER — LIDOCAINE HCL (PF) 1 % IJ SOLN
INTRAMUSCULAR | Status: AC
  Filled 2022-01-14: qty 30

## 2022-01-14 MED ORDER — SODIUM CHLORIDE 0.9 % IV SOLN: INTRAVENOUS | Status: DC

## 2022-01-14 MED ORDER — IODIXANOL 320 MG/ML IV SOLN
INTRAVENOUS | Status: DC | PRN
  Administered 2022-01-14: 50 mL

## 2022-01-14 MED ORDER — ACETAMINOPHEN 325 MG PO TABS: 650.0000 mg | ORAL_TABLET | ORAL | Status: DC | PRN

## 2022-01-14 MED ORDER — MIDAZOLAM HCL 2 MG/2ML IJ SOLN
INTRAMUSCULAR | Status: AC
  Filled 2022-01-14: qty 2

## 2022-01-14 MED ORDER — SODIUM CHLORIDE 0.9% FLUSH: 3.0000 mL | INTRAVENOUS | Status: DC | PRN

## 2022-01-14 MED ORDER — SODIUM CHLORIDE 0.9 % IV SOLN: 250.0000 mL | INTRAVENOUS | Status: DC | PRN

## 2022-01-14 MED ORDER — LIDOCAINE HCL (PF) 1 % IJ SOLN
INTRAMUSCULAR | Status: DC | PRN
  Administered 2022-01-14: 12 mL

## 2022-01-14 SURGICAL SUPPLY — 13 items
CATH ANGIO 5F BER2 65CM (CATHETERS) ×1 IMPLANT
CATH ANGIO 5F PIGTAIL 65CM (CATHETERS) ×1 IMPLANT
DEVICE CLOSURE MYNXGRIP 5F (Vascular Products) ×1 IMPLANT
KIT MICROPUNCTURE NIT STIFF (SHEATH) ×1 IMPLANT
KIT PV (KITS) ×2 IMPLANT
SHEATH PINNACLE 5F 10CM (SHEATH) ×1 IMPLANT
SHEATH PROBE COVER 6X72 (BAG) ×1 IMPLANT
STOPCOCK MORSE 400PSI 3WAY (MISCELLANEOUS) ×1 IMPLANT
SYR MEDRAD MARK 7 150ML (SYRINGE) ×2 IMPLANT
TRANSDUCER W/STOPCOCK (MISCELLANEOUS) ×2 IMPLANT
TRAY PV CATH (CUSTOM PROCEDURE TRAY) ×2 IMPLANT
TUBING CIL FLEX 10 FLL-RA (TUBING) ×1 IMPLANT
WIRE HITORQ VERSACORE ST 145CM (WIRE) ×1 IMPLANT

## 2022-01-14 NOTE — Progress Notes (Signed)
After pt's IV had been taken out and pt was dressed, his wife went to get the vehicle. Pt was wheeled up to the nurses' station in a wheelchair to wait for his wife to call. Pt informs RN that he feels slightly nauseous and dizzy. RN offers pt the option to return to his room, lie down, check his vital signs, and page MD for some nausea medication. Pt stated he wanted to go home and did not want to stay or receive medication for his nausea. Pt assisted to his car by the nurse tech and discharged as planned. Naaman Plummer, [place] a witness to this conversation.

## 2022-01-14 NOTE — Interval H&P Note (Signed)
History and Physical Interval Note:  01/14/2022 1:21 PM  Martin Rubio  has presented today for surgery, with the diagnosis of pad.  The various methods of treatment have been discussed with the patient and family. After consideration of risks, benefits and other options for treatment, the patient has consented to  Procedure(s): ABDOMINAL AORTOGRAM W/LOWER EXTREMITY (N/A) as a surgical intervention.  The patient's history has been reviewed, patient examined, no change in status, stable for surgery.  I have reviewed the patient's chart and labs.  Questions were answered to the patient's satisfaction.     Kathlyn Sacramento

## 2022-01-14 NOTE — Progress Notes (Signed)
Pt ambulated without difficulty or bleeding.   Discharged home with his wife who will drive and stay with pt x 24 hrs. 

## 2022-01-15 ENCOUNTER — Encounter (HOSPITAL_COMMUNITY): Payer: Self-pay | Admitting: Cardiovascular Disease

## 2022-01-20 DIAGNOSIS — H903 Sensorineural hearing loss, bilateral: Secondary | ICD-10-CM | POA: Diagnosis not present

## 2022-01-22 ENCOUNTER — Ambulatory Visit: Payer: Medicare Other

## 2022-01-23 DIAGNOSIS — L4050 Arthropathic psoriasis, unspecified: Secondary | ICD-10-CM | POA: Diagnosis not present

## 2022-01-23 DIAGNOSIS — G609 Hereditary and idiopathic neuropathy, unspecified: Secondary | ICD-10-CM | POA: Diagnosis not present

## 2022-01-23 DIAGNOSIS — N184 Chronic kidney disease, stage 4 (severe): Secondary | ICD-10-CM | POA: Diagnosis not present

## 2022-01-23 DIAGNOSIS — M19072 Primary osteoarthritis, left ankle and foot: Secondary | ICD-10-CM | POA: Diagnosis not present

## 2022-01-23 DIAGNOSIS — M79672 Pain in left foot: Secondary | ICD-10-CM | POA: Diagnosis not present

## 2022-01-26 ENCOUNTER — Telehealth: Payer: Self-pay

## 2022-01-26 ENCOUNTER — Ambulatory Visit (INDEPENDENT_AMBULATORY_CARE_PROVIDER_SITE_OTHER): Payer: Medicare Other

## 2022-01-26 VITALS — Ht 70.0 in | Wt 205.0 lb

## 2022-01-26 DIAGNOSIS — Z0000 Encounter for general adult medical examination without abnormal findings: Secondary | ICD-10-CM

## 2022-01-26 DIAGNOSIS — G4733 Obstructive sleep apnea (adult) (pediatric): Secondary | ICD-10-CM | POA: Diagnosis not present

## 2022-01-26 DIAGNOSIS — R269 Unspecified abnormalities of gait and mobility: Secondary | ICD-10-CM

## 2022-01-26 NOTE — Addendum Note (Signed)
Addended byDamita Dunnings D on: 01/26/2022 03:03 PM   Modules accepted: Orders

## 2022-01-26 NOTE — Telephone Encounter (Signed)
That is okay, arrange referral, DX gait disorder

## 2022-01-26 NOTE — Telephone Encounter (Signed)
Referral placed.

## 2022-01-26 NOTE — Patient Instructions (Signed)
Martin Rubio , Thank you for taking time to complete your Medicare Wellness Visit. I appreciate your ongoing commitment to your health goals. Please review the following plan we discussed and let me know if I can assist you in the future.   Screening recommendations/referrals: Colonoscopy: No longer required Recommended yearly ophthalmology/optometry visit for glaucoma screening and checkup Recommended yearly dental visit for hygiene and checkup  Vaccinations: Influenza vaccine: Up to date Pneumococcal vaccine: Up to date Tdap vaccine: Up to date Shingles vaccine: Completed first dose. May obtain second dose at the pharmacy.   Covid-19: Declined booster.  Advanced directives: Please bring a copy of Living Will and/or Healthcare Power of Attorney for your chart.   Conditions/risks identified: See problem list  Next appointment: Follow up in one year for your annual wellness visit.   Preventive Care 74 Years and Older, Male Preventive care refers to lifestyle choices and visits with your health care provider that can promote health and wellness. What does preventive care include? A yearly physical exam. This is also called an annual well check. Dental exams once or twice a year. Routine eye exams. Ask your health care provider how often you should have your eyes checked. Personal lifestyle choices, including: Daily care of your teeth and gums. Regular physical activity. Eating a healthy diet. Avoiding tobacco and drug use. Limiting alcohol use. Practicing safe sex. Taking low doses of aspirin every day. Taking vitamin and mineral supplements as recommended by your health care provider. What happens during an annual well check? The services and screenings done by your health care provider during your annual well check will depend on your age, overall health, lifestyle risk factors, and family history of disease. Counseling  Your health care provider may ask you questions about  your: Alcohol use. Tobacco use. Drug use. Emotional well-being. Home and relationship well-being. Sexual activity. Eating habits. History of falls. Memory and ability to understand (cognition). Work and work Statistician. Screening  You may have the following tests or measurements: Height, weight, and BMI. Blood pressure. Lipid and cholesterol levels. These may be checked every 5 years, or more frequently if you are over 33 years old. Skin check. Lung cancer screening. You may have this screening every year starting at age 61 if you have a 30-pack-year history of smoking and currently smoke or have quit within the past 15 years. Fecal occult blood test (FOBT) of the stool. You may have this test every year starting at age 33. Flexible sigmoidoscopy or colonoscopy. You may have a sigmoidoscopy every 5 years or a colonoscopy every 10 years starting at age 54. Prostate cancer screening. Recommendations will vary depending on your family history and other risks. Hepatitis C blood test. Hepatitis B blood test. Sexually transmitted disease (STD) testing. Diabetes screening. This is done by checking your blood sugar (glucose) after you have not eaten for a while (fasting). You may have this done every 1-3 years. Abdominal aortic aneurysm (AAA) screening. You may need this if you are a current or former smoker. Osteoporosis. You may be screened starting at age 65 if you are at high risk. Talk with your health care provider about your test results, treatment options, and if necessary, the need for more tests. Vaccines  Your health care provider may recommend certain vaccines, such as: Influenza vaccine. This is recommended every year. Tetanus, diphtheria, and acellular pertussis (Tdap, Td) vaccine. You may need a Td booster every 10 years. Zoster vaccine. You may need this after age 24. Pneumococcal  13-valent conjugate (PCV13) vaccine. One dose is recommended after age 55. Pneumococcal  polysaccharide (PPSV23) vaccine. One dose is recommended after age 4. Talk to your health care provider about which screenings and vaccines you need and how often you need them. This information is not intended to replace advice given to you by your health care provider. Make sure you discuss any questions you have with your health care provider. Document Released: 12/20/2015 Document Revised: 08/12/2016 Document Reviewed: 09/24/2015 Elsevier Interactive Patient Education  2017 [employer] Prevention in the Home Falls can cause injuries. They can happen to people of all ages. There are many things you can do to make your home safe and to help prevent falls. What can I do on the outside of my home? Regularly fix the edges of walkways and driveways and fix any cracks. Remove anything that might make you trip as you walk through a door, such as a raised step or threshold. Trim any bushes or trees on the path to your home. Use bright outdoor lighting. Clear any walking paths of anything that might make someone trip, such as rocks or tools. Regularly check to see if handrails are loose or broken. Make sure that both sides of any steps have handrails. Any raised decks and porches should have guardrails on the edges. Have any leaves, snow, or ice cleared regularly. Use sand or salt on walking paths during winter. Clean up any spills in your garage right away. This includes oil or grease spills. What can I do in the bathroom? Use night lights. Install grab bars by the toilet and in the tub and shower. Do not use towel bars as grab bars. Use non-skid mats or decals in the tub or shower. If you need to sit down in the shower, use a plastic, non-slip stool. Keep the floor dry. Clean up any water that spills on the floor as soon as it happens. Remove soap buildup in the tub or shower regularly. Attach bath mats securely with double-sided non-slip rug tape. Do not have throw rugs and other  things on the floor that can make you trip. What can I do in the bedroom? Use night lights. Make sure that you have a light by your bed that is easy to reach. Do not use any sheets or blankets that are too big for your bed. They should not hang down onto the floor. Have a firm chair that has side arms. You can use this for support while you get dressed. Do not have throw rugs and other things on the floor that can make you trip. What can I do in the kitchen? Clean up any spills right away. Avoid walking on wet floors. Keep items that you use a lot in easy-to-reach places. If you need to reach something above you, use a strong step stool that has a grab bar. Keep electrical cords out of the way. Do not use floor polish or wax that makes floors slippery. If you must use wax, use non-skid floor wax. Do not have throw rugs and other things on the floor that can make you trip. What can I do with my stairs? Do not leave any items on the stairs. Make sure that there are handrails on both sides of the stairs and use them. Fix handrails that are broken or loose. Make sure that handrails are as long as the stairways. Check any carpeting to make sure that it is firmly attached to the stairs. Fix any carpet  that is loose or worn. Avoid having throw rugs at the top or bottom of the stairs. If you do have throw rugs, attach them to the floor with carpet tape. Make sure that you have a light switch at the top of the stairs and the bottom of the stairs. If you do not have them, ask someone to add them for you. What else can I do to help prevent falls? Wear shoes that: Do not have high heels. Have rubber bottoms. Are comfortable and fit you well. Are closed at the toe. Do not wear sandals. If you use a stepladder: Make sure that it is fully opened. Do not climb a closed stepladder. Make sure that both sides of the stepladder are locked into place. Ask someone to hold it for you, if possible. Clearly  mark and make sure that you can see: Any grab bars or handrails. First and last steps. Where the edge of each step is. Use tools that help you move around (mobility aids) if they are needed. These include: Canes. Walkers. Scooters. Crutches. Turn on the lights when you go into a dark area. Replace any light bulbs as soon as they burn out. Set up your furniture so you have a clear path. Avoid moving your furniture around. If any of your floors are uneven, fix them. If there are any pets around you, be aware of where they are. Review your medicines with your doctor. Some medicines can make you feel dizzy. This can increase your chance of falling. Ask your doctor what other things that you can do to help prevent falls. This information is not intended to replace advice given to you by your health care provider. Make sure you discuss any questions you have with your health care provider. Document Released: 09/19/2009 Document Revised: 04/30/2016 Document Reviewed: 12/28/2014 Elsevier Interactive Patient Education  2017 [employer].

## 2022-01-26 NOTE — Progress Notes (Addendum)
Subjective:   Martin Rubio is a 76 y.o. male who presents for an Initial Medicare Annual Wellness Visit.  I connected with Lynne today by telephone and verified that I am speaking with the correct person using two identifiers. Location patient: home Location provider: work Persons participating in the virtual visit: patient, Marine scientist.    I discussed the limitations, risks, security and privacy concerns of performing an evaluation and management service by telephone and the availability of in person appointments. I also discussed with the patient that there may be a patient responsible charge related to this service. The patient expressed understanding and verbally consented to this telephonic visit.    Interactive audio and video telecommunications were attempted between this provider and patient, however failed, due to patient having technical difficulties OR patient did not have access to video capability.  We continued and completed visit with audio only.  Some vital signs may be absent or patient reported.   Time Spent with patient on telephone encounter: 30 minutes   Review of Systems     Cardiac Risk Factors include: male gender;hypertension;advanced age (>48men, >84 women);dyslipidemia     Objective:    Today's Vitals   01/26/22 1021  Weight: 205 lb (93 kg)  Height: 5\' 10"  (1.778 m)   Body mass index is 29.41 kg/m.  Advanced Directives 01/26/2022 01/14/2022 05/09/2020 03/05/2020 12/15/2019 12/13/2019 01/27/2017  Does Patient Have a Medical Advance Directive? Yes Yes Yes No No No No  Type of Paramedic of [place];Living will [place];Living will - - - - -  Does patient want to make changes to medical advance directive? - No - Patient declined No - Patient declined - - Yes (MAU/Ambulatory/Procedural Areas - Information given) -  Copy of [place] in Chart? No - copy requested - - - - - -  Would patient like  information on creating a medical advance directive? - - - - - Yes (MAU/Ambulatory/Procedural Areas - Information given) -    Current Medications (verified) Outpatient Encounter Medications as of 01/26/2022  Medication Sig   acetaminophen (TYLENOL) 325 MG tablet Take 650 mg by mouth every 6 (six) hours as needed for mild pain.   amLODipine (NORVASC) 5 MG tablet TAKE 1 TABLET (5 MG TOTAL) BY MOUTH DAILY.   Ascorbic Acid (VITAMIN C ADULT GUMMIES PO) Take 1 each by mouth daily.   Cholecalciferol (VITAMIN D) 125 MCG (5000 UT) CAPS Take 5,000 Units by mouth daily.    Cyanocobalamin (VITAMIN B 12) 500 MCG TABS Take 1,000 mcg by mouth daily.    cycloSPORINE (RESTASIS) 0.05 % ophthalmic emulsion Place 1 drop into both eyes 2 (two) times daily.   dofetilide (TIKOSYN) 250 MCG capsule TAKE ONE CAPSULE BY MOUTH TWICE A DAY   ELIQUIS 5 MG TABS tablet TAKE 1 TABLET BY MOUTH TWICE A DAY   etanercept (ENBREL) 50 MG/ML injection Inject 50 mg into the skin once a week.   ferrous fumarate (HEMOCYTE - 106 MG FE) 325 (106 Fe) MG TABS tablet Take 1 tablet (106 mg of iron total) by mouth 2 (two) times daily.   fluticasone (FLONASE) 50 MCG/ACT nasal spray Place 1 spray into both nostrils as needed for allergies.   folic acid (FOLVITE) 638 MCG tablet Take 800 mcg by mouth daily.   hydrOXYzine (VISTARIL) 25 MG capsule TAKE 1 CAPSULE (25 MG TOTAL) BY MOUTH EVERY 8 (EIGHT) HOURS AS NEEDED.   Krill Oil 350 MG CAPS Take 350  mg by mouth daily.   levothyroxine (SYNTHROID) 150 MCG tablet Take 1 tablet (150 mcg total) by mouth daily before breakfast.   losartan (COZAAR) 50 MG tablet TAKE 1 TABLET (50 MG TOTAL) BY MOUTH IN THE MORNING AND AT BEDTIME. (Patient taking differently: Take 25 mg by mouth in the morning and at bedtime.)   MAGNESIUM GLYCINATE PO Take 1 tablet by mouth daily.   meclizine (ANTIVERT) 25 MG tablet Take 25 mg by mouth 3 (three) times daily as needed for dizziness.   Melatonin 5 MG TABS Take 5 mg by mouth at  bedtime.   omeprazole (PRILOSEC) 20 MG capsule Take 20 mg by mouth daily. Buys the [employer] brand.   tadalafil, PAH, (ADCIRCA) 20 MG tablet Take 20 mg by mouth daily as needed.   tamsulosin (FLOMAX) 0.4 MG CAPS capsule Take 0.4 mg by mouth at bedtime.    triamcinolone cream (KENALOG) 0.1 % Apply 1 application topically daily as needed for itching.   Alirocumab (PRALUENT) 150 MG/ML SOAJ Inject 150 mg into the skin every 14 (fourteen) days. (Patient not taking: Reported on 01/06/2022)   diclofenac Sodium (VOLTAREN) 1 % GEL Apply 4 g topically in the morning and at bedtime. (Patient not taking: Reported on 01/26/2022)   gabapentin (NEURONTIN) 100 MG capsule Take 1 capsule (100 mg total) by mouth 3 (three) times daily. (Patient not taking: Reported on 01/06/2022)   No facility-administered encounter medications on file as of 01/26/2022.    Allergies (verified) Codeine, Nsaids, Prednisone, Statins, and Sulfa antibiotics   History: Past Medical History:  Diagnosis Date   Bronchitis    Diverticulosis    FH: BPH (benign prostatic hypertrophy)    History of Graves' disease 01/19/2018   S/p ablation   HTN (hypertension)    Hyperlipidemia    Obesity    Psoriatic arthritis (HCC)    Rheumatoid arthritis ([place]) 08/07/2014   Sleep apnea    CPAP   Thyroid disease    Past Surgical History:  Procedure Laterality Date   ABDOMINAL AORTOGRAM W/LOWER EXTREMITY N/A 01/14/2022   Procedure: ABDOMINAL AORTOGRAM W/LOWER EXTREMITY;  Surgeon: Wellington Hampshire, MD;  Location: [facility];  Service: Cardiovascular;  Laterality: N/A;   INGUINAL HERNIA REPAIR     bilateral   KNEE SURGERY     bilateral arthroscopic   l foot surgery Left 12/2020   PACEMAKER IMPLANT N/A 12/13/2019   Procedure: PACEMAKER IMPLANT;  Surgeon: Evans Lance, MD;  Location: [facility];  Service: Cardiovascular;  Laterality: N/A;   TRANSURETHRAL RESECTION OF PROSTATE     UMBILICAL HERNIA REPAIR     Family History  Problem  Relation Age of Onset   CAD Mother 61       CABG   CAD Father 48       Died age 48   Diabetes Brother    Prostate cancer Neg Hx    Colon cancer Neg Hx    Social History   Socioeconomic History   Marital status: Married    Spouse name: Not on file   Number of children: 2   Years of education: Not on file   Highest education level: Not on file  Occupational History   Occupation: Retired    Fish farm manager: [employer]  Tobacco Use   Smoking status: Former    Types: Cigarettes   Smokeless tobacco: Never   Tobacco comments:    Quit 46 years ago.  Vaping Use   Vaping Use: Never used  Substance and Sexual Activity   Alcohol use: No   Drug use: No   Sexual activity: Not on file  Other Topics Concern   Not on file  Social History Narrative   Lives with wife.   Social Determinants of Health   Financial Resource Strain: Low Risk    Difficulty of Paying Living Expenses: Not hard at all  Food Insecurity: No Food Insecurity   Worried About Charity fundraiser in the Last Year: Never true   [place] in the Last Year: Never true  Transportation Needs: No Transportation Needs   Lack of Transportation (Medical): No   Lack of Transportation (Non-Medical): No  Physical Activity: Inactive   Days of Exercise per Week: 0 days   Minutes of Exercise per Session: 0 min  Stress: No Stress Concern Present   Feeling of Stress : Not at all  Social Connections: Moderately Integrated   Frequency of Communication with Friends and Family: Three times a week   Frequency of Social Gatherings with Friends and Family: Twice a week   Attends Religious Services: More than 4 times per year   Active Member of [employer] or Organizations: No   Attends Archivist Meetings: Never   Marital Status: Married    Tobacco Counseling Counseling given: Not Answered Tobacco comments: Quit 46 years ago.   Clinical Intake:  Pre-visit preparation completed: Yes  Pain : 0-10 Pain Type: Chronic  pain Pain Location: Foot (and back pain) Pain Onset: More than a month ago Pain Frequency: Constant     Nutritional Status: BMI 25 -29 Overweight Nutritional Risks: None Diabetes: No  How often do you need to have someone help you when you read instructions, pamphlets, or other written materials from your doctor or pharmacy?: 1 - Never  Diabetic?No  Interpreter Needed?: No  Information entered by :: Caroleen Hamman LPN   Activities of Daily Living In your present state of health, do you have any difficulty performing the following activities: 01/26/2022 01/14/2022  Hearing? Y -  Comment hearing aids -  Vision? N -  Difficulty concentrating or making decisions? N -  Walking or climbing stairs? N N  Dressing or bathing? N -  Doing errands, shopping? N -  Preparing Food and eating ? N -  Using the Toilet? N -  In the past six months, have you accidently leaked urine? N -  Do you have problems with loss of bowel control? N -  Managing your Medications? N -  Managing your Finances? N -  Housekeeping or managing your Housekeeping? N -  Some recent data might be hidden    Patient Care Team: Colon Branch, MD as PCP - General (Internal Medicine) Minus Breeding, MD as PCP - Cardiology (Cardiology) Evans Lance, MD as PCP - Electrophysiology (Cardiology) Rosita Kea, PA-C (Inactive) as Physician Assistant (Rheumatology) Margaretmary Bayley, MD as Referring Physician (Gastroenterology) Hennie Duos, MD as Consulting Physician (Rheumatology)  Indicate any recent Medical Services you may have received from other than [facility] providers in the past year (date may be approximate).     Assessment:   This is a routine wellness examination for [place].  Hearing/Vision screen Hearing Screening - Comments:: Bilateral hearing aids Vision Screening - Comments:: Last eye exam-within last 6 months  Dietary issues and exercise activities discussed: Current Exercise Habits: The patient  does not participate in regular exercise at present, Exercise limited by: None identified   Goals Addressed  This Visit's Progress    Patient Stated       Start going to the gym        Depression Screen PHQ 2/9 Scores 01/26/2022 11/04/2021 08/08/2021 12/10/2020 01/09/2020 06/24/2018  PHQ - 2 Score 0 0 0 0 0 0    Fall Risk Fall Risk  01/26/2022 11/04/2021 08/08/2021 12/10/2020 01/09/2020  Falls in the past year? 0 0 0 0 0  Number falls in past yr: 0 0 0 0 0  Injury with Fall? 0 0 0 0 0  Follow up Falls prevention discussed Falls evaluation completed Falls evaluation completed - Falls evaluation completed    FALL RISK PREVENTION PERTAINING TO THE HOME:  Any stairs in or around the home? Yes  If so, are there any without handrails? Yes  Home free of loose throw rugs in walkways, pet beds, electrical cords, etc? Yes  Adequate lighting in your home to reduce risk of falls? Yes   ASSISTIVE DEVICES UTILIZED TO PREVENT FALLS:  Life alert? No  Use of a cane, walker or w/c? No  Grab bars in the bathroom? Yes  Shower chair or bench in shower? No  Elevated toilet seat or a handicapped toilet? No   TIMED UP AND GO:  Was the test performed? No . Phone visit   Cognitive Function:Normal cognitive status assessed by this Nurse Health Advisor. No abnormalities found.          Immunizations Immunization History  Administered Date(s) Administered   Fluad Quad(high Dose 65+) 11/04/2021   Hepatitis A 05/03/2001   Influenza Split 08/18/2012   Influenza, High Dose Seasonal PF 08/13/2017, 09/24/2018, 11/08/2019, 11/04/2021   Influenza-Unspecified 10/07/2016, 09/02/2020   PFIZER(Purple Top)SARS-COV-2 Vaccination 09/11/2020   Pneumococcal Conjugate-13 12/02/2016   Pneumococcal Polysaccharide-23 11/20/2010   Td 05/03/2001   Tdap 11/12/2021   Typhoid Live 05/03/2001   Zoster Recombinat (Shingrix) 11/12/2021   Zoster, Live 08/16/2011    TDAP status: Up to date  Flu Vaccine  status: Up to date  Pneumococcal vaccine status: Up to date  Covid-19 vaccine status: Declined, Education has been provided regarding the importance of this vaccine but patient still declined. Advised may receive this vaccine at local pharmacy or Health Dept.or vaccine clinic. Aware to provide a copy of the vaccination record if obtained from local pharmacy or Health Dept. Verbalized acceptance and understanding.  Qualifies for Shingles Vaccine? No   Zostavax completed Yes   Shingrix Completed?: Yes  Screening Tests Health Maintenance  Topic Date Due   COVID-19 Vaccine (2 - Pfizer risk series) 10/02/2020   Zoster Vaccines- Shingrix (2 of 2) 01/07/2022   TETANUS/TDAP  11/13/2031   Pneumonia Vaccine 75+ Years old  Completed   INFLUENZA VACCINE  Completed   Hepatitis C Screening  Completed   HPV VACCINES  Aged Out   COLONOSCOPY (Pts 45-41yrs Insurance coverage will need to be confirmed)  [place] Maintenance Due  Topic Date Due   COVID-19 Vaccine (2 - Pfizer risk series) 10/02/2020   Zoster Vaccines- Shingrix (2 of 2) 01/07/2022    Colorectal cancer screening: No longer required.   Lung Cancer Screening: (Low Dose CT Chest recommended if Age 47-80 years, 30 pack-year currently smoking OR have quit w/in 15years.) does not qualify.     Additional Screening:  Hepatitis C Screening: Completed 12/10/2020  Vision Screening: Recommended annual ophthalmology exams for early detection of glaucoma and other disorders of the eye. Is the patient up to date with their  annual eye exam?  Yes  Who is the provider or what is the name of the office in which the patient attends annual eye exams? Dr. Prentice Docker   Dental Screening: Recommended annual dental exams for proper oral hygiene  Community Resource Referral / Chronic Care Management: CRR required this visit?  No   CCM required this visit?  No      Plan:     I have personally reviewed and noted the  following in the patients chart:   Medical and social history Use of alcohol, tobacco or illicit drugs  Current medications and supplements including opioid prescriptions. Patient is not currently taking opioid prescriptions. Functional ability and status Nutritional status Physical activity Advanced directives List of other physicians Hospitalizations, surgeries, and ER visits in previous 12 months Vitals Screenings to include cognitive, depression, and falls Referrals and appointments  In addition, I have reviewed and discussed with patient certain preventive protocols, quality metrics, and best practice recommendations. A written personalized care plan for preventive services as well as general preventive health recommendations were provided to patient.   Due to this being a telephonic visit, the after visit summary with patients personalized plan was offered to patient via mail or my-chart. Patient would like to access on my-chart.   Marta Antu, LPN   [id number]  Nurse Health Advisor  Nurse Notes: None   I have reviewed and agree with [facility] documentation.  Kathlene November, MD  Kathlene November, MD   I have reviewed and agree with [facility] documentation.  Kathlene November, MD

## 2022-01-26 NOTE — Telephone Encounter (Signed)
Patient is requesting a referral to Physical Therapy for low back pain & for balance issues.

## 2022-01-30 DIAGNOSIS — G609 Hereditary and idiopathic neuropathy, unspecified: Secondary | ICD-10-CM | POA: Diagnosis not present

## 2022-01-30 DIAGNOSIS — M79673 Pain in unspecified foot: Secondary | ICD-10-CM | POA: Diagnosis not present

## 2022-01-30 DIAGNOSIS — M5136 Other intervertebral disc degeneration, lumbar region: Secondary | ICD-10-CM | POA: Diagnosis not present

## 2022-01-30 DIAGNOSIS — N189 Chronic kidney disease, unspecified: Secondary | ICD-10-CM | POA: Diagnosis not present

## 2022-01-30 DIAGNOSIS — Z79899 Other long term (current) drug therapy: Secondary | ICD-10-CM | POA: Diagnosis not present

## 2022-01-30 DIAGNOSIS — Z7901 Long term (current) use of anticoagulants: Secondary | ICD-10-CM | POA: Diagnosis not present

## 2022-02-04 ENCOUNTER — Other Ambulatory Visit: Payer: Self-pay

## 2022-02-04 ENCOUNTER — Ambulatory Visit: Payer: Medicare Other | Attending: Internal Medicine | Admitting: Physical Therapy

## 2022-02-04 DIAGNOSIS — R2689 Other abnormalities of gait and mobility: Secondary | ICD-10-CM | POA: Diagnosis not present

## 2022-02-04 DIAGNOSIS — M545 Low back pain, unspecified: Secondary | ICD-10-CM | POA: Insufficient documentation

## 2022-02-04 DIAGNOSIS — R293 Abnormal posture: Secondary | ICD-10-CM | POA: Insufficient documentation

## 2022-02-04 DIAGNOSIS — R269 Unspecified abnormalities of gait and mobility: Secondary | ICD-10-CM | POA: Insufficient documentation

## 2022-02-04 DIAGNOSIS — M62830 Muscle spasm of back: Secondary | ICD-10-CM | POA: Diagnosis not present

## 2022-02-04 DIAGNOSIS — R2681 Unsteadiness on feet: Secondary | ICD-10-CM | POA: Insufficient documentation

## 2022-02-04 DIAGNOSIS — M25652 Stiffness of left hip, not elsewhere classified: Secondary | ICD-10-CM | POA: Insufficient documentation

## 2022-02-04 DIAGNOSIS — R29898 Other symptoms and signs involving the musculoskeletal system: Secondary | ICD-10-CM | POA: Insufficient documentation

## 2022-02-04 DIAGNOSIS — I700 Atherosclerosis of aorta: Secondary | ICD-10-CM

## 2022-02-04 DIAGNOSIS — M25552 Pain in left hip: Secondary | ICD-10-CM | POA: Diagnosis not present

## 2022-02-04 DIAGNOSIS — M6281 Muscle weakness (generalized): Secondary | ICD-10-CM | POA: Insufficient documentation

## 2022-02-04 DIAGNOSIS — G8929 Other chronic pain: Secondary | ICD-10-CM | POA: Insufficient documentation

## 2022-02-04 DIAGNOSIS — I723 Aneurysm of iliac artery: Secondary | ICD-10-CM

## 2022-02-04 NOTE — Therapy (Signed)
[facility] [address]  [place], [place], [address] Phone: [phone]   Fax:  [phone]  Physical Therapy Evaluation  Patient Details  Name: Martin Rubio MRN: [id number] Date of Birth: 07-29-1946 Referring Provider (PT): Colon Branch   Encounter Date: 02/04/2022   PT End of Session - 02/04/22 1103     Visit Number 1    Number of Visits 16    Date for PT Re-Evaluation 04/01/22    Authorization Type Medicare & [employer]    PT Start Time 1105    PT Stop Time 1150    PT Time Calculation (min) 45 min    Activity Tolerance Patient tolerated treatment well;Patient limited by pain    Behavior During Therapy [facility] for tasks assessed/performed             Past Medical History:  Diagnosis Date   Bronchitis    Diverticulosis    FH: BPH (benign prostatic hypertrophy)    History of Graves' disease 01/19/2018   S/p ablation   HTN (hypertension)    Hyperlipidemia    Obesity    Psoriatic arthritis ([place])    Rheumatoid arthritis ([place]) 08/07/2014   Sleep apnea    CPAP   Thyroid disease     Past Surgical History:  Procedure Laterality Date   ABDOMINAL AORTOGRAM W/LOWER EXTREMITY N/A 01/14/2022   Procedure: ABDOMINAL AORTOGRAM W/LOWER EXTREMITY;  Surgeon: Wellington Hampshire, MD;  Location: [facility];  Service: Cardiovascular;  Laterality: N/A;   INGUINAL HERNIA REPAIR     bilateral   KNEE SURGERY     bilateral arthroscopic   l foot surgery Left 12/2020   PACEMAKER IMPLANT N/A 12/13/2019   Procedure: PACEMAKER IMPLANT;  Surgeon: Evans Lance, MD;  Location: [facility];  Service: Cardiovascular;  Laterality: N/A;   TRANSURETHRAL RESECTION OF PROSTATE     UMBILICAL HERNIA REPAIR      There were no vitals filed for this visit.    Subjective Assessment - 02/04/22 1112     Subjective Pt reports he has some balance difficulties, as well as low back pain.  He also has trouble with breathing, not  sure if it is due to blood flow or lack of stamina.  reports increased pain in LLE going up stairs and sometimes leg gives out.    Pertinent History Pace maker 12/13/19, psoriatic arthritis, osteoporosis, Afib (anticoagulated), HOH, sick sinus syndrome, high cholesterol, hypothyroidism, OSA on CPAP, graves disease, bil arthroscopic knee surgery, left ventricular hypertropy, iliac aneurysm, neuropathy, CKD, osteoarthritis in bil knees    Limitations Sitting;Lifting;House hold activities    How long can you sit comfortably? unlimited    How long can you stand comfortably? <20 minutes    How long can you walk comfortably? can't walk long due to hip and back pain, also SOB    Diagnostic tests 04/25/20 lumbar xray: Mild degenerative disc disease is noted at L3-4. No acuteabnormality seen in the lumbar spine.; 04/25/20 thoracic xray: No acute abnormality seen in the thoracic spine. Multilevel degenerative disc disease is noted.    Patient Stated Goals build stamina    Currently in Pain? Yes    Pain Score 0-No pain   worst 3-4/10 last 24 hours, increases to 6/10 with prolonged standing.   Pain Location Back    Pain Orientation Left    Pain Descriptors / Indicators Aching;Nagging    Pain Type Chronic pain  Pain Onset Other (comment)   couple of years   Pain Frequency Intermittent    Aggravating Factors  stairs, standing, walking    Pain Relieving Factors laying down, sitting down    Effect of Pain on Daily Activities has multilevel house, has to be very careful, limited mobility due to pain and SOB    Multiple Pain Sites Yes    Pain Score 3   increases to 6/10 sharp pain with standing   Pain Location Knee    Pain Orientation Left    Pain Descriptors / Indicators Sharp;Aching    Pain Type Chronic pain    Pain Frequency Intermittent    Aggravating Factors  standing up                [facility] PT Assessment - 02/04/22 0001       Assessment   Medical Diagnosis R26.9 (ICD-10-CM) - Gait disorder    referred for balance and LBP   Referring Provider (PT) Weld, Breda    Onset Date/Surgical Date --   years   Hand Dominance Right    Next MD Visit 04/14/2022    Prior Therapy yes for upper back      Precautions   Precautions ICD/Pacemaker      Restrictions   Weight Bearing Restrictions No      Balance Screen   Has the patient fallen in the past 6 months No    Has the patient had a decrease in activity level because of a fear of falling?  Yes    Is the patient reluctant to leave their home because of a fear of falling?  Yes      [place] Private residence    Living Arrangements Spouse/significant other    Available Help at Discharge Family    Type of [place] to enter    Entrance Stairs-Number of Steps 2    Entrance Stairs-Rails None    Home Layout Multi-level    Alternate Level Stairs-Number of Steps 8    Alternate Level Stairs-Rails Right    [place] - single point      Prior Function   Level of Independence Independent    Vocation Retired      Associate Professor   Overall Cognitive Status Within Functional Limits for tasks assessed      Observation/Other Assessments   Observations enters independently but visually slow gait speed.  No apparent distress with sitting, but noted increasingly antalgic with walking, and increased pain with sit to stand transfers.    Focus on Therapeutic Outcomes (FOTO)  balance:53%, 57% predicted after 12 visits      Sensation   Additional Comments reports decreased sensation L foot, seeing specialist for neuropathy      Posture/Postural Control   Posture/Postural Control Postural limitations    Postural Limitations Forward head;Rounded Shoulders      ROM / Strength   AROM / PROM / Strength Strength;AROM;PROM      AROM   Overall AROM  Within functional limits for tasks performed    AROM Assessment Site Lumbar    Lumbar Flexion to knees, no pain    Lumbar Extension WNL no pain     Lumbar - Right Side Bend mid thigh no pain    Lumbar - Left Side Bend mid thigh no pain    Lumbar - Right Rotation WNL no pain    Lumbar - Left Rotation WNL no pain  PROM   Overall PROM  Deficits    Overall PROM Comments noted tightness for both ER and IR in both hips      Strength   Overall Strength Deficits    Overall Strength Comments tested in sitting, noted weakness and increased pain in L hip with MMT    Strength Assessment Site Hip;Knee;Ankle    Right/Left Hip Right;Left    Right Hip Flexion 4+/5    Right Hip Extension 3+/5    Right Hip ABduction 4+/5    Right Hip ADduction 4+/5    Left Hip Flexion 3+/5    Left Hip Extension 3+/5    Left Hip ABduction 4+/5    Left Hip ADduction 4+/5    Right/Left Knee Right;Left    Right Knee Extension 5/5    Left Knee Extension 5/5    Right/Left Ankle Right;Left    Right Ankle Dorsiflexion 4+/5    Left Ankle Dorsiflexion 4/5      Flexibility   Soft Tissue Assessment /Muscle Length yes    Hamstrings tightness bil      Palpation   Palpation comment tenderness left posterior knee      Special Tests   Other special tests Inc pain FABER bil (L hip, R back), neg SLR      Ambulation/Gait   Ambulation/Gait Yes    Ambulation/Gait Assistance 5: Supervision    Ambulation/Gait Assistance Details SBA    Ambulation Distance (Feet) 878 Feet    Assistive device None    Gait Pattern Step-through pattern;Antalgic    Ambulation Surface Level    Gait velocity 0.65 m/s or 2.2ft/sec      6 minute walk test results    Aerobic Endurance Distance Walked 878    Endurance additional comments O2 98, HR 65 pre and post test, no SOB, but increased L hip pain as test progressed and increased antalgic gait, some unsteadiness, staggering x 3, deviating to Right      Standardized Balance Assessment   Standardized Balance Assessment Five Times Sit to Stand    Five times sit to stand comments  20 seconds, increased L knee pain                         Objective measurements completed on examination: See above findings.                PT Education - 02/04/22 1210     Education Details recommendations for L knee pain (return to orthopedist, consider more synvisc injections to help with knee pain while working on strengthening), also need L hip assessed.  Also consider checking out [facility] again for aquatic exercise, go midmorning to avoid crowds.    Person(s) Educated Patient    Methods Explanation    Comprehension Verbalized understanding              PT Short Term Goals - 02/04/22 1223       PT SHORT TERM GOAL #1   Title Pt will be independent with initial HEP.    Time 3    Period Weeks    Status New    Target Date 02/25/22      PT SHORT TERM GOAL #2   Title Complete Berg and/or DGI to further assess balance deficits    Time 2    Period Weeks    Status New    Target Date 02/18/22  PT Long Term Goals - 02/04/22 1224       PT LONG TERM GOAL #1   Title Pt will be independent with advanced HEP.    Time 8    Period Weeks    Status New    Target Date 04/01/22      PT LONG TERM GOAL #2   Title Pt. will demonstrate improved LLE strength to 4+/5 throughout to decrease risk of falls.    Baseline 3/5-3+/5 LLE strength, increased pain    Time 8    Period Weeks    Status New    Target Date 04/01/22      PT LONG TERM GOAL #3   Title Pt. will demonstrate improved endurance by completing 1100 ft in 6 minutes without increased pain or DOE.    Baseline 878 ft,well below average for age/sex, increased L hip pain    Time 8    Period Weeks    Status New    Target Date 04/01/22      PT LONG TERM GOAL #4   Title Pt. will demonstrate improved functional strength by completing 5xSTS in <15 seconds.    Baseline 20 sec, increased L knee pain    Time 8    Period Weeks    Status New    Target Date 04/01/22      PT LONG TERM GOAL #5   Title Patient to report 75% improvement  in pain.    Baseline reports back, L hip, L knee > R knee pain with activity    Time 8    Period Weeks    Status New    Target Date 04/01/22      Additional Long Term Goals   Additional Long Term Goals Yes      PT LONG TERM GOAL #6   Title Patient will demonstrate decreased fall risk with goal TBD by Berg/DGI    Baseline complete next session    Time 8    Period Weeks    Status New    Target Date 04/01/22                    Plan - 02/04/22 1213     Clinical Impression Statement Pt is a 76 year old male referred to PT with concerns about low back pain and balance issues.  He reports history of chronic LBP which responded well in the past to aquatic exercise, but had stopped going.  He also reports history of severe bil knee OA and psoriatic arthritis.  He reports feeling unsteady last year but denies falls.  He also has concerns about blood flow to legs and poor exercise tolerance and would like to improve his stamina.  Examination today demonstrates increased risk of falls with 5xSTS of 20 sec (>14 sec indicates balance dysfunction) and FOTO for balance reports only 53% confidence.  His lumbar AROM was WNL and pain free, but noted significant hip tightness bil and pain with + FABER on L, suggesting that L hip pain is referring to his back.  Reported primarily L hip pain with gait as well today.  He demonstrates decreased gait speed of 0.66m/s (>0.8 needed for community ambulation), and decreased tolerance to exercise, completing <948ft on 6MWT, although vitals were stable and did not report any SOB with exercise, but did become increasingly antalgic with increasing L hip pain.  He would benefit from skilled physical therapy to decrease pain, improve activity tolerance, strength and endurance and improve balance to  prevent falls with injury.    Personal Factors and Comorbidities Age;Comorbidity 3+;Time since onset of injury/illness/exacerbation    Comorbidities Pacemaker, psoriatic  arthritis, osteoporosis, Afib (anticoagulated), HOH, sick sinus syndrome, high cholesterol, hypothyroidism, OSA on CPAP, graves disease, bil arthroscopic knee surgery, left ventricular hypertropy, iliac aneurysm, neuropathy, CKD    Examination-Activity Limitations Sit;Bend;Squat;Carry;Stand;Stairs;Transfers;Dressing;Lift;Locomotion Level;Bed Mobility    Examination-Participation Restrictions Church;Cleaning;Shop;Community Activity;Driving;Yard Work;Laundry;Meal Prep    Stability/Clinical Decision Making Evolving/Moderate complexity    Clinical Decision Making Moderate    Rehab Potential Good    PT Frequency 2x / week    PT Duration 8 weeks    PT Treatment/Interventions ADLs/Self Care Home Management;Cryotherapy;Moist Heat;Balance training;Therapeutic exercise;Therapeutic activities;Functional mobility training;Stair training;Gait training;Neuromuscular re-education;Patient/family education;Manual techniques;Taping;Energy conservation;Dry needling;Passive range of motion;Joint Manipulations;Spinal Manipulations;Aquatic Therapy    PT Next Visit Plan complete BERG and DGI, HEP for LE strengthening    Consulted and Agree with Plan of Care Patient             Patient will benefit from skilled therapeutic intervention in order to improve the following deficits and impairments:  Hypomobility, Decreased activity tolerance, Decreased strength, Increased fascial restricitons, Pain, Difficulty walking, Increased muscle spasms, Cardiopulmonary status limiting activity, Improper body mechanics, Decreased range of motion, Impaired flexibility, Postural dysfunction, Abnormal gait, Decreased balance, Decreased mobility, Decreased scar mobility, Decreased endurance  Visit Diagnosis: Unsteadiness on feet  Other abnormalities of gait and mobility  Muscle weakness (generalized)  Chronic left-sided low back pain without sciatica  Pain in left hip  Stiffness of left hip, not elsewhere  classified     Problem List Patient Active Problem List   Diagnosis Date Noted   Aortic atherosclerosis ([place]) 03/18/2021   History of COVID-19 02/10/2021   Bruit 02/09/2021   SOB (shortness of breath) 02/09/2021   Aortic valve sclerosis 02/09/2021   Secondary hypercoagulable state ([place]) 03/05/2020   Educated about COVID-19 virus infection 01/27/2020   PCP notes >>>>>>>>>>>>>>> 01/10/2020   Age related osteoporosis 01/10/2020   CKD (chronic kidney disease), stage IV (HCC) 12/16/2019   Pacemaker 12/16/2019   Sick sinus syndrome (HCC) 12/16/2019   Chest pain 12/15/2019   Paroxysmal atrial fibrillation (HCC) 10/26/2019   Dyslipidemia 09/14/2019   Nodule of flexor tendon sheath 04/13/2019   Hemorrhoids 06/24/2018   Low bone mass 01/19/2018   Iliac aneurysm ([place]) 06/16/2017   Hypertrophic cardiomyopathy ([place]) 06/16/2017   Essential hypertension 12/16/2016   Hyperlipidemia LDL goal <100 12/16/2016   Asthma in adult, mild intermittent, uncomplicated 35/70/1779   Collapsed vertebra, not elsewhere classified, cervical region, initial encounter for fracture ([place]) 10/08/2016   Annual physical exam 10/08/2016   Neuropathy 10/08/2016   Contracture of ankle and foot joint 08/07/2014   Dysphagia 08/07/2014   Esophageal reflux 08/07/2014   Erectile dysfunction 08/07/2014   Obstructive sleep apnea 08/07/2014   Osteoarthritis 08/07/2014   Rheumatoid arthritis ([place]) 08/07/2014   Sensorineural hearing loss 08/07/2014   Sleep disorder 08/07/2014   Tinnitus 08/07/2014   LVH (left ventricular hypertrophy) 12/25/2012   Diverticulosis 09/01/2011   White coat hypertension 07/07/2011   Hypothyroidism 06/09/2011   Postablative hypothyroidism 06/09/2011   Psoriatic arthritis ([place]) 06/09/2011   Benign prostatic hyperplasia 06/09/2011    Rennie Natter, PT, DPT  02/04/2022, 12:31 PM  [facility] [address]  [place], [place], [address] Phone: [phone]   Fax:  [phone]  Name: Martin Rubio MRN: [id number] Date of Birth: Dec 05, 1946

## 2022-02-06 ENCOUNTER — Other Ambulatory Visit: Payer: Self-pay | Admitting: Pharmacist

## 2022-02-06 ENCOUNTER — Other Ambulatory Visit: Payer: Self-pay | Admitting: *Deleted

## 2022-02-06 DIAGNOSIS — E785 Hyperlipidemia, unspecified: Secondary | ICD-10-CM

## 2022-02-06 DIAGNOSIS — I700 Atherosclerosis of aorta: Secondary | ICD-10-CM

## 2022-02-06 LAB — LIPID PANEL
Chol/HDL Ratio: 6.6 ratio — ABNORMAL HIGH (ref 0.0–5.0)
Cholesterol, Total: 249 mg/dL — ABNORMAL HIGH (ref 100–199)
HDL: 38 mg/dL — ABNORMAL LOW (ref >39)
LDL Chol Calc (NIH): 171 mg/dL — ABNORMAL HIGH (ref 0–99)
Triglycerides: 215 mg/dL — ABNORMAL HIGH (ref 0–149)
VLDL Cholesterol Cal: 40 mg/dL (ref 5–40)

## 2022-02-06 NOTE — Progress Notes (Signed)
Placing lipid panel order ?

## 2022-02-09 ENCOUNTER — Other Ambulatory Visit: Payer: Self-pay

## 2022-02-09 ENCOUNTER — Ambulatory Visit: Payer: Medicare Other | Admitting: Physical Therapy

## 2022-02-09 ENCOUNTER — Encounter: Payer: Self-pay | Admitting: Physical Therapy

## 2022-02-09 ENCOUNTER — Telehealth: Payer: Self-pay

## 2022-02-09 DIAGNOSIS — G8929 Other chronic pain: Secondary | ICD-10-CM | POA: Diagnosis not present

## 2022-02-09 DIAGNOSIS — M545 Low back pain, unspecified: Secondary | ICD-10-CM | POA: Diagnosis not present

## 2022-02-09 DIAGNOSIS — M25552 Pain in left hip: Secondary | ICD-10-CM | POA: Diagnosis not present

## 2022-02-09 DIAGNOSIS — R2689 Other abnormalities of gait and mobility: Secondary | ICD-10-CM | POA: Diagnosis not present

## 2022-02-09 DIAGNOSIS — R2681 Unsteadiness on feet: Secondary | ICD-10-CM | POA: Diagnosis not present

## 2022-02-09 DIAGNOSIS — M25652 Stiffness of left hip, not elsewhere classified: Secondary | ICD-10-CM

## 2022-02-09 DIAGNOSIS — M6281 Muscle weakness (generalized): Secondary | ICD-10-CM | POA: Diagnosis not present

## 2022-02-09 DIAGNOSIS — E785 Hyperlipidemia, unspecified: Secondary | ICD-10-CM

## 2022-02-09 NOTE — Telephone Encounter (Signed)
Called the patient and lvm to notify him that he completed the labs already on march 3rd 2023 and that there was no need to repeat as of now.  ?

## 2022-02-09 NOTE — Patient Instructions (Signed)
Access code [email or web address] ?

## 2022-02-09 NOTE — Therapy (Signed)
[facility] [address]  [place], [place], [address] Phone: [phone]   Fax:  [phone]  Physical Therapy Treatment  Patient Details  Name: Martin Rubio MRN: [id number] Date of Birth: 1945-12-26 Referring Provider (PT): Colon Branch   Encounter Date: 02/09/2022   PT End of Session - 02/09/22 0928     Visit Number 2    Number of Visits 16    Date for PT Re-Evaluation 04/01/22    Authorization Type Medicare & [employer]    PT Start Time 0930    PT Stop Time 1030    PT Time Calculation (min) 60 min    Activity Tolerance Patient tolerated treatment well;Patient limited by pain    Behavior During Therapy [facility] for tasks assessed/performed             Past Medical History:  Diagnosis Date   Bronchitis    Diverticulosis    FH: BPH (benign prostatic hypertrophy)    History of Graves' disease 01/19/2018   S/p ablation   HTN (hypertension)    Hyperlipidemia    Obesity    Psoriatic arthritis ([place])    Rheumatoid arthritis ([place]) 08/07/2014   Sleep apnea    CPAP   Thyroid disease     Past Surgical History:  Procedure Laterality Date   ABDOMINAL AORTOGRAM W/LOWER EXTREMITY N/A 01/14/2022   Procedure: ABDOMINAL AORTOGRAM W/LOWER EXTREMITY;  Surgeon: Wellington Hampshire, MD;  Location: [facility];  Service: Cardiovascular;  Laterality: N/A;   INGUINAL HERNIA REPAIR     bilateral   KNEE SURGERY     bilateral arthroscopic   l foot surgery Left 12/2020   PACEMAKER IMPLANT N/A 12/13/2019   Procedure: PACEMAKER IMPLANT;  Surgeon: Evans Lance, MD;  Location: [facility];  Service: Cardiovascular;  Laterality: N/A;   TRANSURETHRAL RESECTION OF PROSTATE     UMBILICAL HERNIA REPAIR      There were no vitals filed for this visit.   Subjective Assessment - 02/09/22 0932     Subjective Pt reports continued pain in both knees and L hip.  Denies falls, but reports stumbling some and looking for cane  but couldn't find it.    Pertinent History Pace maker 12/13/19, psoriatic arthritis, osteoporosis, Afib (anticoagulated), HOH, sick sinus syndrome, high cholesterol, hypothyroidism, OSA on CPAP, graves disease, bil arthroscopic knee surgery, left ventricular hypertropy, iliac aneurysm, neuropathy, CKD, osteoarthritis in bil knees    Limitations Sitting;Lifting;House hold activities    How long can you sit comfortably? unlimited    How long can you stand comfortably? <20 minutes    How long can you walk comfortably? can't walk long due to hip and back pain, also SOB    Diagnostic tests 04/25/20 lumbar xray: Mild degenerative disc disease is noted at L3-4. No acuteabnormality seen in the lumbar spine.; 04/25/20 thoracic xray: No acute abnormality seen in the thoracic spine. Multilevel degenerative disc disease is noted.    Patient Stated Goals build stamina    Currently in Pain? Yes    Pain Score 0-No pain    Pain Location Back    Pain Orientation Left    Pain Onset Other (comment)   couple of years   Pain Score 4    Pain Location Knee    Pain Orientation Left    Pain Descriptors / Indicators Aching                [facility]  PT Assessment - 02/09/22 0001       Precautions   Precautions ICD/Pacemaker      Balance   Balance Assessed Yes      Standardized Balance Assessment   Standardized Balance Assessment Berg Balance Test      Berg Balance Test   Sit to Stand Able to stand without using hands and stabilize independently    Standing Unsupported Able to stand safely 2 minutes    Sitting with Back Unsupported but Feet Supported on Floor or Stool Able to sit safely and securely 2 minutes    Stand to Sit Sits safely with minimal use of hands    Transfers Able to transfer safely, minor use of hands    Standing Unsupported with Eyes Closed Able to stand 10 seconds safely    Standing Unsupported with Feet Together Able to place feet together independently and stand 1 minute safely    From  Standing, Reach Forward with Outstretched Arm Can reach forward >12 cm safely (5")    From Standing Position, Pick up Object from Floor Able to pick up shoe safely and easily    From Standing Position, Turn to Look Behind Over each Shoulder Looks behind one side only/other side shows less weight shift    Turn 360 Degrees Able to turn 360 degrees safely in 4 seconds or less    Standing Unsupported, Alternately Place Feet on Step/Stool Able to stand independently and safely and complete 8 steps in 20 seconds    Standing Unsupported, One Foot in Front Able to plae foot ahead of the other independently and hold 30 seconds    Standing on One Leg Able to lift leg independently and hold > 10 seconds    Total Score 53    Berg comment: low risk of falls      Functional Gait  Assessment   Gait assessed  Yes    Gait Level Surface Walks 20 ft in less than 7 sec but greater than 5.5 sec, uses assistive device, slower speed, mild gait deviations, or deviates 6-10 in outside of the 12 in walkway width.    Change in Gait Speed Able to smoothly change walking speed without loss of balance or gait deviation. Deviate no more than 6 in outside of the 12 in walkway width.    Gait with Horizontal Head Turns Performs head turns smoothly with slight change in gait velocity (eg, minor disruption to smooth gait path), deviates 6-10 in outside 12 in walkway width, or uses an assistive device.    Gait with Vertical Head Turns Performs head turns with no change in gait. Deviates no more than 6 in outside 12 in walkway width.    Gait and Pivot Turn Pivot turns safely within 3 sec and stops quickly with no loss of balance.    Step Over Obstacle Is able to step over 2 stacked shoe boxes taped together (9 in total height) without changing gait speed. No evidence of imbalance.    Gait with Narrow Base of Support Ambulates 7-9 steps.    Gait with Eyes Closed Walks 20 ft, no assistive devices, good speed, no evidence of imbalance,  normal gait pattern, deviates no more than 6 in outside 12 in walkway width. Ambulates 20 ft in less than 7 sec.    Ambulating Backwards Walks 20 ft, uses assistive device, slower speed, mild gait deviations, deviates 6-10 in outside 12 in walkway width.    Steps Two feet to a stair, must use  rail.    Total Score 24    FGA comment: steps limited by knee pain                           [facility] Adult PT Treatment/Exercise - 02/09/22 0001       Knee/Hip Exercises: Aerobic   Nustep L4 x 5 min   complaining of increased L knee/hip pain initially     Knee/Hip Exercises: Standing   Heel Raises Both;2 sets;10 reps    Heel Raises Limitations counter support    Hip Flexion Stengthening;Both;10 reps    Hip Flexion Limitations counter support, cues for control    Hip Abduction Stengthening;Both;10 reps;Knee straight    Abduction Limitations counter support, cues for control    Hip Extension Stengthening;10 reps;Knee straight    Extension Limitations counter support, cues for posture                 Balance Exercises - 02/09/22 0001       Balance Exercises: Standing   Standing Eyes Opened Narrow base of support (BOS);Head turns;Foam/compliant surface;Limitations    Standing Eyes Opened Limitations x 30 eyes open, x 10 head nods, x 10 head turns, repeated sequenced on airex with feet apart.  In corner with SBA for safety.  Increased sway on airex.    Standing Eyes Closed Narrow base of support (BOS);Head turns;Foam/compliant surface;Limitations    Standing Eyes Closed Limitations x 30 eyes closed, x 10 head nods, x 10 head turns, repeated sequenced on airex with feet apart.  In corner with SBA for safety.  Increased sway on airex.    Tandem Stance 15 secs    SLS 10 secs                PT Education - 02/09/22 1031     Education Details HEP update for LE strengthening and balance.  Access code [phone]  Education on how to perform safely - in corner, flat surface,  can place chair in front or have wife stand by.  Cues to perform exercises slowly with support due to frequent complaint of knee buckling.    Person(s) Educated Patient    Methods Explanation;Demonstration;Verbal cues;Handout    Comprehension Verbalized understanding;Returned demonstration              PT Short Term Goals - 02/09/22 0929       PT SHORT TERM GOAL #1   Title Pt will be independent with initial HEP.    Time 3    Period Weeks    Status On-going    Target Date 02/25/22      PT SHORT TERM GOAL #2   Title Complete Berg and/or DGI to further assess balance deficits    Time 2    Period Weeks    Status Achieved    Target Date 02/18/22               PT Long Term Goals - 02/09/22 0929       PT LONG TERM GOAL #1   Title Pt will be independent with advanced HEP.    Time 8    Period Weeks    Status On-going    Target Date 04/01/22      PT LONG TERM GOAL #2   Title Pt. will demonstrate improved LLE strength to 4+/5 throughout to decrease risk of falls.    Baseline 3/5-3+/5 LLE strength, increased pain    Time 8  Period Weeks    Status On-going    Target Date 04/01/22      PT LONG TERM GOAL #3   Title Pt. will demonstrate improved endurance by completing 1100 ft in 6 minutes without increased pain or DOE.    Baseline 878 ft,well below average for age/sex, increased L hip pain    Time 8    Period Weeks    Status On-going    Target Date 04/01/22      PT LONG TERM GOAL #4   Title Pt. will demonstrate improved functional strength by completing 5xSTS in <15 seconds.    Baseline 20 sec, increased L knee pain    Time 8    Period Weeks    Status On-going    Target Date 04/01/22      PT LONG TERM GOAL #5   Title Patient to report 75% improvement in pain.    Baseline reports back, L hip, L knee > R knee pain with activity    Time 8    Period Weeks    Status On-going    Target Date 04/01/22      PT LONG TERM GOAL #6   Title Patient will demonstrate  decreased fall risk with goal TBD by Berg/DGI    Baseline FGA - 24/30, Berg 53/56.  Not at increased risk of falls by these measures.    Time 8    Period Weeks    Status Achieved    Target Date 04/01/22                   Plan - 02/09/22 1034     Clinical Impression Statement Pt. reported knee pain at beginning of session with nu-step, but as session progressed reported less pain.  Educated on importance of movement and strengthening, especially since he has severe OA and is not interested in joint replacement.  He completed Berg and demonstrated good standing balance, 53/56.  He also completed FGA scoring 24/30, noted today limitations on stairs due to knee pain, but difficulty walking with head turns, staggering significantly.   Focused interventions today on deficits, including corner balance exercises with head turns, very challenged when placed on airex due to neuropathy, and also given HEP for hip strengthening.  He would benefit from continued skilled therapy.    Personal Factors and Comorbidities Age;Comorbidity 3+;Time since onset of injury/illness/exacerbation    Comorbidities Pacemaker, psoriatic arthritis, osteoporosis, Afib (anticoagulated), HOH, sick sinus syndrome, high cholesterol, hypothyroidism, OSA on CPAP, graves disease, bil arthroscopic knee surgery, left ventricular hypertropy, iliac aneurysm, neuropathy, CKD    Examination-Activity Limitations Sit;Bend;Squat;Carry;Stand;Stairs;Transfers;Dressing;Lift;Locomotion Level;Bed Mobility    Examination-Participation Restrictions Church;Cleaning;Shop;Community Activity;Driving;Yard Work;Laundry;Meal Prep    Stability/Clinical Decision Making Evolving/Moderate complexity    Rehab Potential Good    PT Frequency 2x / week    PT Duration 8 weeks    PT Treatment/Interventions ADLs/Self Care Home Management;Cryotherapy;Moist Heat;Balance training;Therapeutic exercise;Therapeutic activities;Functional mobility training;Stair  training;Gait training;Neuromuscular re-education;Patient/family education;Manual techniques;Taping;Energy conservation;Dry needling;Passive range of motion;Joint Manipulations;Spinal Manipulations;Aquatic Therapy    PT Next Visit Plan complete BERG and DGI, HEP for LE strengthening    Consulted and Agree with Plan of Care Patient             Patient will benefit from skilled therapeutic intervention in order to improve the following deficits and impairments:  Hypomobility, Decreased activity tolerance, Decreased strength, Increased fascial restricitons, Pain, Difficulty walking, Increased muscle spasms, Cardiopulmonary status limiting activity, Improper body mechanics, Decreased range of motion, Impaired flexibility, Postural  dysfunction, Abnormal gait, Decreased balance, Decreased mobility, Decreased scar mobility, Decreased endurance  Visit Diagnosis: Unsteadiness on feet  Other abnormalities of gait and mobility  Muscle weakness (generalized)  Chronic left-sided low back pain without sciatica  Pain in left hip  Stiffness of left hip, not elsewhere classified     Problem List Patient Active Problem List   Diagnosis Date Noted   Aortic atherosclerosis ([place]) 03/18/2021   History of COVID-19 02/10/2021   Bruit 02/09/2021   SOB (shortness of breath) 02/09/2021   Aortic valve sclerosis 02/09/2021   Secondary hypercoagulable state ([place]) 03/05/2020   Educated about COVID-19 virus infection 01/27/2020   PCP notes >>>>>>>>>>>>>>> 01/10/2020   Age related osteoporosis 01/10/2020   CKD (chronic kidney disease), stage IV ([place]) 12/16/2019   Pacemaker 12/16/2019   Sick sinus syndrome ([place]) 12/16/2019   Chest pain 12/15/2019   Paroxysmal atrial fibrillation ([place]) 10/26/2019   Dyslipidemia 09/14/2019   Nodule of flexor tendon sheath 04/13/2019   Hemorrhoids 06/24/2018   Low bone mass 01/19/2018   Iliac aneurysm ([place]) 06/16/2017   Hypertrophic cardiomyopathy ([place]) 06/16/2017    Essential hypertension 12/16/2016   Hyperlipidemia LDL goal <100 12/16/2016   Asthma in adult, mild intermittent, uncomplicated 70/96/2836   Collapsed vertebra, not elsewhere classified, cervical region, initial encounter for fracture ([place]) 10/08/2016   Annual physical exam 10/08/2016   Neuropathy 10/08/2016   Contracture of ankle and foot joint 08/07/2014   Dysphagia 08/07/2014   Esophageal reflux 08/07/2014   Erectile dysfunction 08/07/2014   Obstructive sleep apnea 08/07/2014   Osteoarthritis 08/07/2014   Rheumatoid arthritis ([place]) 08/07/2014   Sensorineural hearing loss 08/07/2014   Sleep disorder 08/07/2014   Tinnitus 08/07/2014   LVH (left ventricular hypertrophy) 12/25/2012   Diverticulosis 09/01/2011   White coat hypertension 07/07/2011   Hypothyroidism 06/09/2011   Postablative hypothyroidism 06/09/2011   Psoriatic arthritis ([place]) 06/09/2011   Benign prostatic hyperplasia 06/09/2011    Rennie Natter, PT, DPT  02/09/2022, 10:48 AM  [facility] [address]  [place], [place], [address] Phone: [phone]   Fax:  [phone]  Name: JUSTINIAN MIANO MRN: [id number] Date of Birth: 1946-03-15

## 2022-02-09 NOTE — Telephone Encounter (Signed)
-----   Message from Newt Minion, RN sent at 02/06/2022  1:27 PM EST ----- ?Regarding: Labs ?Hi Addelyn Alleman,  ? ?Just wanted to make you aware that this patient came in today for Fasting Labs and there were no orders. Looks like you send him a letter and when you do that, please put in the orders and you can send with the letter to the patient, the lab slips. That way when they come in they have the slips and if not then the orders are in the system.  ? ?Thanks and let me know if you have any questions. ? ?

## 2022-02-12 DIAGNOSIS — Z23 Encounter for immunization: Secondary | ICD-10-CM | POA: Diagnosis not present

## 2022-02-16 ENCOUNTER — Other Ambulatory Visit: Payer: Self-pay

## 2022-02-16 ENCOUNTER — Telehealth: Payer: Self-pay | Admitting: Cardiology

## 2022-02-16 MED ORDER — LEVOTHYROXINE SODIUM 150 MCG PO TABS: 150.0000 ug | ORAL_TABLET | Freq: Every day | ORAL | 0 refills | Status: DC

## 2022-02-16 NOTE — Telephone Encounter (Signed)
Message routed to [facility]/pharmacy team regarding anticoag and request for med change  ?

## 2022-02-16 NOTE — Telephone Encounter (Signed)
We do not have any active prescription drug insurance information in Epic so we can't confirm the copay of either Eliquis or Xarelto for him. Does he have any active insurance? The quoted copay for Eliquis is unusually high. Did the pharmacy tell him that his copay for Xarelto would only be $44? They cannot tell that without an active prescription on file. Typically the copay for Eliquis and Xarelto is the same on most insurance plans. His CrCl is exactly 50 so he is right on the borderline for needing Xarelto '15mg'$  vs '20mg'$  daily if he is changed from Eliquis to Xarelto if the copay is actually that much cheaper, would need MD to weigh in on dose though. ?

## 2022-02-16 NOTE — Telephone Encounter (Signed)
Pt c/o medication issue: ? ?1. Name of Medication: ELIQUIS 5 MG TABS tablet ? ?2. How are you currently taking this medication (dosage and times per day)? As directed ? ?3. Are you having a reaction (difficulty breathing--STAT)? no ? ?4. What is your medication issue? Patient states that he went to pick up this medication and it was going to cost $895.00. He states that they did offer him an alternative, Xalrelto. He's not sure if he can get this prescribed instead beings that it will only cost him $44. ?He is going to the dentist at 3pm and will be available after 4pm to talk.  ?

## 2022-02-17 ENCOUNTER — Encounter: Payer: Self-pay | Admitting: Cardiovascular Disease

## 2022-02-17 ENCOUNTER — Telehealth: Payer: Self-pay

## 2022-02-17 ENCOUNTER — Ambulatory Visit (INDEPENDENT_AMBULATORY_CARE_PROVIDER_SITE_OTHER): Payer: Medicare Other | Admitting: Cardiovascular Disease

## 2022-02-17 ENCOUNTER — Other Ambulatory Visit: Payer: Self-pay | Admitting: Pharmacist Clinician (PhC)/ Clinical Pharmacy Specialist

## 2022-02-17 ENCOUNTER — Other Ambulatory Visit: Payer: Self-pay

## 2022-02-17 VITALS — BP 152/80 | HR 40 | Ht 70.0 in | Wt 208.0 lb

## 2022-02-17 DIAGNOSIS — E785 Hyperlipidemia, unspecified: Secondary | ICD-10-CM

## 2022-02-17 DIAGNOSIS — I739 Peripheral vascular disease, unspecified: Secondary | ICD-10-CM | POA: Diagnosis not present

## 2022-02-17 DIAGNOSIS — I480 Paroxysmal atrial fibrillation: Secondary | ICD-10-CM

## 2022-02-17 DIAGNOSIS — I10 Essential (primary) hypertension: Secondary | ICD-10-CM | POA: Diagnosis not present

## 2022-02-17 MED ORDER — RIVAROXABAN 15 MG PO TABS: ORAL_TABLET | ORAL | 1 refills | Status: DC

## 2022-02-17 NOTE — Telephone Encounter (Signed)
LMTCB

## 2022-02-17 NOTE — Patient Instructions (Signed)
Medication Instructions:  °No changes °*If you need a refill on your cardiac medications before your next appointment, please call your pharmacy* ° ° °Lab Work: °None ordered °If you have labs (blood work) drawn today and your tests are completely normal, you will receive your results only by: °MyChart Message (if you have MyChart) OR °A paper copy in the mail °If you have any lab test that is abnormal or we need to change your treatment, we will call you to review the results. ° ° °Testing/Procedures: °None ordered ° ° °Follow-Up: °At [facility], you and your health needs are our priority.  As part of our continuing mission to provide you with exceptional heart care, we have created designated Provider Care Teams.  These Care Teams include your primary Cardiologist (physician) and Advanced Practice Providers (APPs -  Physician Assistants and Nurse Practitioners) who all work together to provide you with the care you need, when you need it. ° °We recommend signing up for the patient portal called "MyChart".  Sign up information is provided on this After Visit Summary.  MyChart is used to connect with patients for Virtual Visits (Telemedicine).  Patients are able to view lab/test results, encounter notes, upcoming appointments, etc.  Non-urgent messages can be sent to your provider as well.   °To learn more about what you can do with MyChart, go to [email or web address].   ° °Your next appointment:   °6 month(s) ° °The format for your next appointment:   °In Person ° °Provider:   °Dr. Arida ° °

## 2022-02-17 NOTE — Telephone Encounter (Signed)
Pa sent to plan for praluent '150mg'$ : ?ZAEEM KANDEL ([facility] [place]) - [id number] ?Praluent '150MG'$ /ML auto-injectors ?Status: PA Request ?Created: March 14th, 2023 ?Sent: March 14th, 2023 ?

## 2022-02-17 NOTE — Progress Notes (Signed)
?  ?Cardiology Office Note ? ? ?Date:  02/17/2022  ? ?ID:  Martin Rubio, DOB Nov 15, 1946, MRN [id number] ? ?PCP:  Martin Branch, MD  ?Cardiologist: Dr. [name] ? ?No chief complaint on file. ? ? ? ?  ?History of Present Illness: ?Martin Rubio is a 76 y.o. male who is here today for a follow-up visit regarding peripheral arterial disease and iliac artery aneurysms.   ?He has known history of essential hypertension, paroxysmal atrial fibrillation/flutter, bradycardia status post pacemaker placement, psoriatic arthritis on Enbrel, carotid disease and hyperlipidemia.  He has history of remote smoking but quit more than 45 years ago.  He is not diabetic. ?The patient has been followed for iliac aneurysms.  He has known history of iliac aneurysm for many years since at least 2016.  At that time, the size was 1.7 cm in diameter.   ?Most recent duplex in October 2022 showed that the aneurysm in the right common iliac artery measured 2.1 cm with significant stenosis and velocity of 425.  On the left side, the aneurysm measured 2.3 cm with significant stenosis in the proximal left common iliac artery with peak velocity of 500.  ABI in December 2021 was normal on the right and mildly reduced on the left at 0.85.  No significant infrainguinal disease was noted by duplex. ? ?He had recent worsening of bilateral hip and leg claudication.  I proceeded with angiography last month which showed small to medium size aortic aneurysm above the iliac bifurcation, severe occlusive disease affecting bilateral common iliac artery worse on the left side with large sized aneurysm on the left side and medium size on the right side with very tortuous iliac arteries.  Endovascular options were limited and thus I consulted vascular surgery for evaluation.  He might require aortobifemoral bypass.  He is going to have a CT scan done and then he has an appointment with Dr. Virl Cagey. ? ? ?Past Medical History:  ?Diagnosis Date  ? Bronchitis   ?  Diverticulosis   ? FH: BPH (benign prostatic hypertrophy)   ? History of Graves' disease 01/19/2018  ? S/p ablation  ? HTN (hypertension)   ? Hyperlipidemia   ? Obesity   ? Psoriatic arthritis ([place])   ? Rheumatoid arthritis ([place]) 08/07/2014  ? Sleep apnea   ? CPAP  ? Thyroid disease   ? ? ?Past Surgical History:  ?Procedure Laterality Date  ? ABDOMINAL AORTOGRAM W/LOWER EXTREMITY N/A 01/14/2022  ? Procedure: ABDOMINAL AORTOGRAM W/LOWER EXTREMITY;  Surgeon: Wellington Hampshire, MD;  Location: [facility];  Service: Cardiovascular;  Laterality: N/A;  ? INGUINAL HERNIA REPAIR    ? bilateral  ? KNEE SURGERY    ? bilateral arthroscopic  ? l foot surgery Left 12/2020  ? PACEMAKER IMPLANT N/A 12/13/2019  ? Procedure: PACEMAKER IMPLANT;  Surgeon: Evans Lance, MD;  Location: [facility];  Service: Cardiovascular;  Laterality: N/A;  ? TRANSURETHRAL RESECTION OF PROSTATE    ? UMBILICAL HERNIA REPAIR    ? ? ? ?Current Outpatient Medications  ?Medication Sig Dispense Refill  ? acetaminophen (TYLENOL) 325 MG tablet Take 650 mg by mouth every 6 (six) hours as needed for mild pain.    ? amLODipine (NORVASC) 5 MG tablet TAKE 1 TABLET (5 MG TOTAL) BY MOUTH DAILY. 90 tablet 3  ? Ascorbic Acid (VITAMIN C ADULT GUMMIES PO) Take 1 each by mouth daily.    ? Cholecalciferol (VITAMIN D) 125 MCG (5000 UT) CAPS Take  5,000 Units by mouth daily.     ? Cyanocobalamin (VITAMIN B 12) 500 MCG TABS Take 1,000 mcg by mouth daily.     ? cycloSPORINE (RESTASIS) 0.05 % ophthalmic emulsion Place 1 drop into both eyes 2 (two) times daily.    ? diclofenac Sodium (VOLTAREN) 1 % GEL Apply 4 g topically in the morning and at bedtime. 100 g 6  ? dofetilide (TIKOSYN) 250 MCG capsule TAKE ONE CAPSULE BY MOUTH TWICE A DAY 180 capsule 2  ? ELIQUIS 5 MG TABS tablet TAKE 1 TABLET BY MOUTH TWICE A DAY 180 tablet 1  ? etanercept (ENBREL) 50 MG/ML injection Inject 50 mg into the skin once a week.    ? ferrous fumarate (HEMOCYTE - 106 MG FE) 325 (106 Fe) MG  TABS tablet Take 1 tablet (106 mg of iron total) by mouth 2 (two) times daily. 60 tablet 3  ? folic acid (FOLVITE) 443 MCG tablet Take 800 mcg by mouth daily.    ? hydrOXYzine (VISTARIL) 25 MG capsule TAKE 1 CAPSULE (25 MG TOTAL) BY MOUTH EVERY 8 (EIGHT) HOURS AS NEEDED. 60 capsule 1  ? levothyroxine (SYNTHROID) 150 MCG tablet Take 1 tablet (150 mcg total) by mouth daily before breakfast. 30 tablet 0  ? losartan (COZAAR) 50 MG tablet TAKE 1 TABLET (50 MG TOTAL) BY MOUTH IN THE MORNING AND AT BEDTIME. (Patient taking differently: Take 25 mg by mouth in the morning and at bedtime.) 180 tablet 3  ? MAGNESIUM GLYCINATE PO Take 1 tablet by mouth daily.    ? Melatonin 5 MG TABS Take 5 mg by mouth at bedtime.    ? omeprazole (PRILOSEC) 20 MG capsule Take 20 mg by mouth daily. Buys the [employer] brand.    ? tadalafil, PAH, (ADCIRCA) 20 MG tablet Take 20 mg by mouth daily as needed.    ? triamcinolone cream (KENALOG) 0.1 % Apply 1 application topically daily as needed for itching.    ? Alirocumab (PRALUENT) 150 MG/ML SOAJ Inject 150 mg into the skin every 14 (fourteen) days. (Patient not taking: Reported on 02/17/2022) 2 mL 11  ? fluticasone (FLONASE) 50 MCG/ACT nasal spray Place 1 spray into both nostrils as needed for allergies. (Patient not taking: Reported on 02/17/2022)    ? gabapentin (NEURONTIN) 100 MG capsule Take 1 capsule (100 mg total) by mouth 3 (three) times daily. (Patient not taking: Reported on 02/17/2022) 90 capsule 1  ? Krill Oil 350 MG CAPS Take 350 mg by mouth daily. (Patient not taking: Reported on 02/17/2022)    ? meclizine (ANTIVERT) 25 MG tablet Take 25 mg by mouth 3 (three) times daily as needed for dizziness. (Patient not taking: Reported on 02/17/2022)    ? tamsulosin (FLOMAX) 0.4 MG CAPS capsule Take 0.4 mg by mouth at bedtime.  (Patient not taking: Reported on 02/17/2022)    ? ?No current facility-administered medications for this visit.  ? ? ?Allergies:   Codeine, Nsaids, Prednisone, Statins, and Sulfa  antibiotics  ? ? ?Social History:  The patient  reports that he has quit smoking. His smoking use included cigarettes. He has never used smokeless tobacco. He reports that he does not drink alcohol and does not use drugs.  ? ?Family History:  The patient's family history includes CAD (age of onset: 78) in his mother; CAD (age of onset: 54) in his father; Diabetes in his brother.  ? ? ?ROS:  Please see the history of present illness.   Otherwise, review of systems are  positive for none.   All other systems are reviewed and negative.  ? ? ?PHYSICAL EXAM: ?VS:  BP (!) 152/80   Pulse (!) 40   Ht '5\' 10"'$  (1.778 m)   Wt 208 lb (94.3 kg)   SpO2 95%   BMI 29.84 kg/m?  , BMI Body mass index is 29.84 kg/m?. ?GEN: Well nourished, well developed, in no acute distress  ?HEENT: normal  ?Neck: no JVD, carotid bruits, or masses ?Cardiac: RRR; no murmurs, rubs, or gallops,no edema  ?Respiratory:  clear to auscultation bilaterally, normal work of breathing ?GI: soft, nontender, nondistended, + BS ?MS: no deformity or atrophy  ?Skin: warm and dry, no rash ?Neuro:  Strength and sensation are intact ?Psych: euthymic mood, full affect ?Vascular: Femoral pulse: normal on the right and slightly diminished on the left.  No groin hematoma ? ? ?EKG:  EKG is not ordered today. ? ? ? ?Recent Labs: ?08/08/2021: ALT 14 ?12/16/2021: BUN 24; Creatinine, Ser 1.63; Hemoglobin 11.7; Platelets 165.0; Potassium 4.4; Sodium 138; TSH 12.35  ? ? ?Lipid Panel ?   ?Component Value Date/Time  ? CHOL 249 (H) 02/06/2022 0956  ? TRIG 215 (H) 02/06/2022 0956  ? HDL 38 (L) 02/06/2022 0956  ? CHOLHDL 6.6 (H) 02/06/2022 0956  ? CHOLHDL 6 08/08/2021 1002  ? VLDL 28.8 08/08/2021 1002  ? Dustin 171 (H) 02/06/2022 0956  ? ?  ? ?Wt Readings from Last 3 Encounters:  ?02/17/22 208 lb (94.3 kg)  ?01/26/22 205 lb (93 kg)  ?01/14/22 205 lb (93 kg)  ?  ? ? ? ? ?No flowsheet data found. ? ? ? ?ASSESSMENT AND PLAN: ? ?1.  Peripheral arterial disease with bilateral common iliac  artery aneurysm and severe occlusive disease: Currently with severe lifestyle limiting claudication.  Reports that both legs give out on him with walking especially the left side.  Follow-up with Dr. Virl Cagey as

## 2022-02-17 NOTE — Telephone Encounter (Signed)
-----   Message from Rockne Menghini, RPH-CPP sent at 02/17/2022  9:48 AM EDT ----- ?Regarding: PA ?Can you run Praluent or Repatha through his new insurance.   ? ?[employer] ?BIN [id number] ?PCN [id number] ?GRP [id number] ? ?ID [id number] ? ?Once approved please DON'T call to pharmacy until I have a chance to explain some stuff ? ?Thanks!  ? ?

## 2022-02-17 NOTE — Telephone Encounter (Signed)
PRALUENT '150MG'$  Q2W APPROVED UNTIL FURTHER NOTICE. ? ?Routing to [employer] as she was needing this info to call pharmacy to try to get tikosyn, and xarelto figured out.  ?

## 2022-02-17 NOTE — Telephone Encounter (Signed)
Patient insurance has recently changed.  Current plan does not prefer Eliquis - copay is 47% of cost, whereas Xarelto is set $44/month copay.   ? ?SCr 1.63, Wt 94.3 kg age 76, CrCl 30 - based on this will give Xarelto 15 mg daily ?

## 2022-02-17 NOTE — Telephone Encounter (Signed)
See below PharmD message. Pt would need to call his insurance to verify the Eliquis copay, or have the pharmacy run the rx on file. ?

## 2022-02-18 ENCOUNTER — Ambulatory Visit: Payer: Medicare Other | Admitting: Physical Therapy

## 2022-02-18 ENCOUNTER — Encounter: Payer: Self-pay | Admitting: Physical Therapy

## 2022-02-18 DIAGNOSIS — Z20822 Contact with and (suspected) exposure to covid-19: Secondary | ICD-10-CM | POA: Diagnosis not present

## 2022-02-18 DIAGNOSIS — R2681 Unsteadiness on feet: Secondary | ICD-10-CM

## 2022-02-18 DIAGNOSIS — M25552 Pain in left hip: Secondary | ICD-10-CM

## 2022-02-18 DIAGNOSIS — R2689 Other abnormalities of gait and mobility: Secondary | ICD-10-CM

## 2022-02-18 DIAGNOSIS — M25652 Stiffness of left hip, not elsewhere classified: Secondary | ICD-10-CM

## 2022-02-18 DIAGNOSIS — G8929 Other chronic pain: Secondary | ICD-10-CM | POA: Diagnosis not present

## 2022-02-18 DIAGNOSIS — M545 Low back pain, unspecified: Secondary | ICD-10-CM

## 2022-02-18 DIAGNOSIS — M6281 Muscle weakness (generalized): Secondary | ICD-10-CM | POA: Diagnosis not present

## 2022-02-18 NOTE — Telephone Encounter (Signed)
Left message for pt to call.

## 2022-02-18 NOTE — Therapy (Signed)
[facility] [address]  [address] [place], [place], [address] Phone: [phone]   Fax:  [phone]  Physical Therapy Treatment  Patient Details  Name: Martin Rubio MRN: [id number] Date of Birth: 12/22/1945 Referring Provider (PT): Wanda Plump   Encounter Date: 02/18/2022   PT End of Session - 02/18/22 1103     Visit Number 3    Number of Visits 16    Date for PT Re-Evaluation 04/01/22    Authorization Type Medicare & [employer]    PT Start Time 1019    PT Stop Time 1100    PT Time Calculation (min) 41 min    Activity Tolerance Patient tolerated treatment well;Patient limited by pain    Behavior During Therapy [facility] for tasks assessed/performed             Past Medical History:  Diagnosis Date   Bronchitis    Diverticulosis    FH: BPH (benign prostatic hypertrophy)    History of Graves' disease 01/19/2018   S/p ablation   HTN (hypertension)    Hyperlipidemia    Obesity    Psoriatic arthritis ([facility])    Rheumatoid arthritis ([facility]) 08/07/2014   Sleep apnea    CPAP   Thyroid disease     Past Surgical History:  Procedure Laterality Date   ABDOMINAL AORTOGRAM W/LOWER EXTREMITY N/A 01/14/2022   Procedure: ABDOMINAL AORTOGRAM W/LOWER EXTREMITY;  Surgeon: Iran Ouch, MD;  Location: [facility];  Service: Cardiovascular;  Laterality: N/A;   INGUINAL HERNIA REPAIR     bilateral   KNEE SURGERY     bilateral arthroscopic   l foot surgery Left 12/2020   PACEMAKER IMPLANT N/A 12/13/2019   Procedure: PACEMAKER IMPLANT;  Surgeon: Marinus Maw, MD;  Location: [facility];  Service: Cardiovascular;  Laterality: N/A;   TRANSURETHRAL RESECTION OF PROSTATE     UMBILICAL HERNIA REPAIR      There were no vitals filed for this visit.   Subjective Assessment - 02/18/22 1024     Subjective Pt. reports saw cardiologist who is planning on doing bypass in legs due to claudication.  Worried running out of  eliquis but prescription refill would be $900 and tring to switch medication.   Denies new falls.  Has not been doing balance exercises and frequent as should have.  Woke up with cramps in knees but feels better now.    Pertinent History Pace maker 12/13/19, psoriatic arthritis, osteoporosis, Afib (anticoagulated), HOH, sick sinus syndrome, high cholesterol, hypothyroidism, OSA on CPAP, graves disease, bil arthroscopic knee surgery, left ventricular hypertropy, iliac aneurysm, neuropathy, CKD, osteoarthritis in bil knees    Limitations Sitting;Lifting;House hold activities    How long can you sit comfortably? unlimited    How long can you stand comfortably? <20 minutes    How long can you walk comfortably? can't walk long due to hip and back pain, also SOB    Diagnostic tests 04/25/20 lumbar xray: Mild degenerative disc disease is noted at L3-4. No acuteabnormality seen in the lumbar spine.; 04/25/20 thoracic xray: No acute abnormality seen in the thoracic spine. Multilevel degenerative disc disease is noted.    Patient Stated Goals build stamina    Currently in Pain? Yes    Pain Score 5     Pain Location Knee    Pain Orientation Right;Left    Pain Onset Other (comment)   couple of years  [facility] Adult PT Treatment/Exercise - 02/18/22 0001       Lumbar Exercises: Aerobic   Stationary Bike L1 x 6 min      Knee/Hip Exercises: Standing   Heel Raises Both;2 sets;10 reps    Heel Raises Limitations counter support    Hip Flexion Stengthening;Both;10 reps    Hip Flexion Limitations counter support, cues for control    Hip Abduction Stengthening;Both;10 reps;Knee straight    Abduction Limitations counter support, cues for control    Hip Extension Stengthening;10 reps;Knee straight    Extension Limitations counter support, cues for posture    Functional Squat 5 reps    Functional Squat Limitations increased knee pain bil even when decreased amplitude  of movement and instructed on form    SLS --    Other Standing Knee Exercises --                 Balance Exercises - 02/18/22 0001       Balance Exercises: Standing   SLS Eyes open;Upper extremity support 1;30 secs    Stepping Strategy Anterior;Lateral;Limitations    Stepping Strategy Limitations foward stepping over 1/2 foam roller  and return 2 x 10 bil, several LOB, CGA for safety throughout, stepping sideways over half foam roller and return - no LOB, good control x 10 bil    Toe Raise 10 reps    Other Standing Exercises cone taps for SLS - x 10 bil, noted some ankle instability, but able to progress to 2 cone taps before returning, CGA for safety, mirror for posture.  Challenging.                PT Education - 02/18/22 1041     Education Details reviewed HEP, added toe raises.    Person(s) Educated Patient    Methods Explanation;Demonstration;Handout;Verbal cues    Comprehension Verbalized understanding;Returned demonstration              PT Short Term Goals - 02/09/22 0929       PT SHORT TERM GOAL #1   Title Pt will be independent with initial HEP.    Time 3    Period Weeks    Status On-going    Target Date 02/25/22      PT SHORT TERM GOAL #2   Title Complete Berg and/or DGI to further assess balance deficits    Time 2    Period Weeks    Status Achieved    Target Date 02/18/22               PT Long Term Goals - 02/09/22 0929       PT LONG TERM GOAL #1   Title Pt will be independent with advanced HEP.    Time 8    Period Weeks    Status On-going    Target Date 04/01/22      PT LONG TERM GOAL #2   Title Pt. will demonstrate improved LLE strength to 4+/5 throughout to decrease risk of falls.    Baseline 3/5-3+/5 LLE strength, increased pain    Time 8    Period Weeks    Status On-going    Target Date 04/01/22      PT LONG TERM GOAL #3   Title Pt. will demonstrate improved endurance by completing 1100 ft in 6 minutes without  increased pain or DOE.    Baseline 878 ft,well below average for age/sex, increased L hip pain    Time 8    Period  Weeks    Status On-going    Target Date 04/01/22      PT LONG TERM GOAL #4   Title Pt. will demonstrate improved functional strength by completing 5xSTS in <15 seconds.    Baseline 20 sec, increased L knee pain    Time 8    Period Weeks    Status On-going    Target Date 04/01/22      PT LONG TERM GOAL #5   Title Patient to report 75% improvement in pain.    Baseline reports back, L hip, L knee > R knee pain with activity    Time 8    Period Weeks    Status On-going    Target Date 04/01/22      PT LONG TERM GOAL #6   Title Patient will demonstrate decreased fall risk with goal TBD by Berg/DGI    Baseline FGA - 24/30, Berg 53/56.  Not at increased risk of falls by these measures.    Time 8    Period Weeks    Status Achieved    Target Date 04/01/22                   Plan - 02/18/22 1201     Clinical Impression Statement Pt. again reported knee pain at start of session, decreased after nu-step, but worsened with squats, so did not add squats to HEP.  Challenged with foward stepping with balance, and noted ankle weakness bil with cone taps.  Reviewed HEP, and demonstrated how muscles HEP designed for affect balance and need for strengthening.  Pt. participated well throughout session and would benefit from continued skilled therapy.    Personal Factors and Comorbidities Age;Comorbidity 3+;Time since onset of injury/illness/exacerbation    Comorbidities Pacemaker, psoriatic arthritis, osteoporosis, Afib (anticoagulated), HOH, sick sinus syndrome, high cholesterol, hypothyroidism, OSA on CPAP, graves disease, bil arthroscopic knee surgery, left ventricular hypertropy, iliac aneurysm, neuropathy, CKD    Examination-Activity Limitations Sit;Bend;Squat;Carry;Stand;Stairs;Transfers;Dressing;Lift;Locomotion Level;Bed Mobility    Examination-Participation Restrictions  Church;Cleaning;Shop;Community Activity;Driving;Yard Work;Laundry;Meal Prep    Stability/Clinical Decision Making Evolving/Moderate complexity    Rehab Potential Good    PT Frequency 2x / week    PT Duration 8 weeks    PT Treatment/Interventions ADLs/Self Care Home Management;Cryotherapy;Moist Heat;Balance training;Therapeutic exercise;Therapeutic activities;Functional mobility training;Stair training;Gait training;Neuromuscular re-education;Patient/family education;Manual techniques;Taping;Energy conservation;Dry needling;Passive range of motion;Joint Manipulations;Spinal Manipulations;Aquatic Therapy    PT Next Visit Plan complete BERG and DGI, HEP for LE strengthening    Consulted and Agree with Plan of Care Patient             Patient will benefit from skilled therapeutic intervention in order to improve the following deficits and impairments:  Hypomobility, Decreased activity tolerance, Decreased strength, Increased fascial restricitons, Pain, Difficulty walking, Increased muscle spasms, Cardiopulmonary status limiting activity, Improper body mechanics, Decreased range of motion, Impaired flexibility, Postural dysfunction, Abnormal gait, Decreased balance, Decreased mobility, Decreased scar mobility, Decreased endurance  Visit Diagnosis: Unsteadiness on feet  Other abnormalities of gait and mobility  Muscle weakness (generalized)  Chronic left-sided low back pain without sciatica  Pain in left hip  Stiffness of left hip, not elsewhere classified     Problem List Patient Active Problem List   Diagnosis Date Noted   Aortic atherosclerosis (HCC) 03/18/2021   History of COVID-19 02/10/2021   Bruit 02/09/2021   SOB (shortness of breath) 02/09/2021   Aortic valve sclerosis 02/09/2021   Secondary hypercoagulable state (HCC) 03/05/2020   Educated about COVID-19 virus infection 01/27/2020  PCP notes >>>>>>>>>>>>>>> 01/10/2020   Age related osteoporosis 01/10/2020   CKD  (chronic kidney disease), stage IV (HCC) 12/16/2019   Pacemaker 12/16/2019   Sick sinus syndrome (HCC) 12/16/2019   Chest pain 12/15/2019   Paroxysmal atrial fibrillation (HCC) 10/26/2019   Dyslipidemia 09/14/2019   Nodule of flexor tendon sheath 04/13/2019   Hemorrhoids 06/24/2018   Low bone mass 01/19/2018   Iliac aneurysm (HCC) 06/16/2017   Hypertrophic cardiomyopathy (HCC) 06/16/2017   Essential hypertension 12/16/2016   Hyperlipidemia LDL goal <100 12/16/2016   Asthma in adult, mild intermittent, uncomplicated 10/12/2016   Collapsed vertebra, not elsewhere classified, cervical region, initial encounter for fracture (HCC) 10/08/2016   Annual physical exam 10/08/2016   Neuropathy 10/08/2016   Contracture of ankle and foot joint 08/07/2014   Dysphagia 08/07/2014   Esophageal reflux 08/07/2014   Erectile dysfunction 08/07/2014   Obstructive sleep apnea 08/07/2014   Osteoarthritis 08/07/2014   Rheumatoid arthritis (HCC) 08/07/2014   Sensorineural hearing loss 08/07/2014   Sleep disorder 08/07/2014   Tinnitus 08/07/2014   LVH (left ventricular hypertrophy) 12/25/2012   Diverticulosis 09/01/2011   White coat hypertension 07/07/2011   Hypothyroidism 06/09/2011   Postablative hypothyroidism 06/09/2011   Psoriatic arthritis (HCC) 06/09/2011   Benign prostatic hyperplasia 06/09/2011    Jena Gauss, PT, DPT  02/18/2022, 12:09 PM  [facility] [address]  [address] [place], [place], [address] Phone: [phone]   Fax:  [phone]  Name: CRISTOVAL SHINES MRN: [id number] Date of Birth: [birth year]

## 2022-02-20 ENCOUNTER — Other Ambulatory Visit: Payer: Self-pay

## 2022-02-20 ENCOUNTER — Ambulatory Visit: Payer: Medicare Other | Admitting: Physical Therapy

## 2022-02-20 DIAGNOSIS — R2689 Other abnormalities of gait and mobility: Secondary | ICD-10-CM

## 2022-02-20 DIAGNOSIS — R2681 Unsteadiness on feet: Secondary | ICD-10-CM | POA: Diagnosis not present

## 2022-02-20 DIAGNOSIS — M6281 Muscle weakness (generalized): Secondary | ICD-10-CM

## 2022-02-20 DIAGNOSIS — M545 Low back pain, unspecified: Secondary | ICD-10-CM

## 2022-02-20 DIAGNOSIS — M25552 Pain in left hip: Secondary | ICD-10-CM | POA: Diagnosis not present

## 2022-02-20 DIAGNOSIS — G8929 Other chronic pain: Secondary | ICD-10-CM | POA: Diagnosis not present

## 2022-02-20 NOTE — Therapy (Signed)
[place] [facility] ?[address] ?[place], [place], [address] ?Phone: [phone]   Fax:  [phone] ? ?Physical Therapy Treatment ? ?Patient Details  ?Name: Martin Rubio ?MRN: [id number] ?Date of Birth: 1946-01-01 ?Referring Provider (PT): [place], [place] E. ? ? ?Encounter Date: 02/20/2022 ? ? PT End of Session - 02/20/22 1019   ? ? Visit Number 4   ? Number of Visits 16   ? Date for PT Re-Evaluation 04/01/22   ? Authorization Type Medicare & [employer] Life   ? PT Start Time 1018   ? PT Stop Time 1100   ? PT Time Calculation (min) 42 min   ? Activity Tolerance Patient tolerated treatment well;Patient limited by pain   ? Behavior During Therapy [facility] for tasks assessed/performed   ? ?  ?  ? ?  ? ? ?Past Medical History:  ?Diagnosis Date  ? Bronchitis   ? Diverticulosis   ? FH: BPH (benign prostatic hypertrophy)   ? History of Graves' disease 01/19/2018  ? S/p ablation  ? HTN (hypertension)   ? Hyperlipidemia   ? Obesity   ? Psoriatic arthritis ([place])   ? Rheumatoid arthritis ([place]) 08/07/2014  ? Sleep apnea   ? CPAP  ? Thyroid disease   ? ? ?Past Surgical History:  ?Procedure Laterality Date  ? ABDOMINAL AORTOGRAM W/LOWER EXTREMITY N/A 01/14/2022  ? Procedure: ABDOMINAL AORTOGRAM W/LOWER EXTREMITY;  Surgeon: Wellington Hampshire, MD;  Location: [facility];  Service: Cardiovascular;  Laterality: N/A;  ? INGUINAL HERNIA REPAIR    ? bilateral  ? KNEE SURGERY    ? bilateral arthroscopic  ? l foot surgery Left 12/2020  ? PACEMAKER IMPLANT N/A 12/13/2019  ? Procedure: PACEMAKER IMPLANT;  Surgeon: Evans Lance, MD;  Location: [facility];  Service: Cardiovascular;  Laterality: N/A;  ? TRANSURETHRAL RESECTION OF PROSTATE    ? UMBILICAL HERNIA REPAIR    ? ? ?There were no vitals filed for this visit. ? ? Subjective Assessment - 02/20/22 1020   ? ? Subjective Pt. reports he started Xarelto today instead of Eliquis, which is much more affordable, but made him a little  nauseous this morning.   ? Pertinent History Pace maker 12/13/19, psoriatic arthritis, osteoporosis, Afib (anticoagulated), HOH, sick sinus syndrome, high cholesterol, hypothyroidism, OSA on CPAP, graves disease, bil arthroscopic knee surgery, left ventricular hypertropy, iliac aneurysm, neuropathy, CKD, osteoarthritis in bil knees   ? Limitations Sitting;Lifting;House hold activities   ? How long can you sit comfortably? unlimited   ? How long can you stand comfortably? <20 minutes   ? How long can you walk comfortably? can't walk long due to hip and back pain, also SOB   ? Diagnostic tests 04/25/20 lumbar xray: Mild degenerative disc disease is noted at L3-4. No acuteabnormality seen in the lumbar spine.; 04/25/20 thoracic xray: No acute abnormality seen in the thoracic spine. Multilevel degenerative disc disease is noted.   ? Patient Stated Goals build stamina   ? Currently in Pain? Yes   ? Pain Score 5    ? Pain Location Knee   ? Pain Orientation Right;Left   ? Pain Onset Other (comment)   couple of years  ? ?  ?  ? ?  ? ? ? ? ? [facility] PT Assessment - 02/20/22 0001   ? ?  ? Precautions  ? Precautions ICD/Pacemaker   ? ?  ?  ? ?  ? ? ? ? ? ? ? ? ? ? ? ? ? ? ? ?  [facility] Adult PT Treatment/Exercise - 02/20/22 0001   ? ?  ? Lumbar Exercises: Aerobic  ? Stationary Bike L2 x 5 min   ?  ? Lumbar Exercises: Machines for Strengthening  ? Cybex Knee Extension 10# x 10   increased R knee pain, d/c'd  ? Cybex Knee Flexion 10# x 15, 20# x 10   ? Leg Press 15# 2 x 10   ? ?  ?  ? ?  ? ? ? ? ? ? Balance Exercises - 02/20/22 0001   ? ?  ? Balance Exercises: Standing  ? Standing, One Foot on a Step Eyes open;Head turns;8 inch;Other reps (comment);Limitations   ? Standing, One Foot on a Step Limitations horizontal head turns 2 x 10 bil, CGA for safety.   ? Stepping Strategy Anterior;Lateral;Limitations   ? Stepping Strategy Limitations starting foward step over 1/2 foam roller 2 x 10 bil.  Lateral stepping and reaching to target 2 x 10  bil progressively increasing distance.  Tai chi weight shifts to side and foward x 10 bil.   ? Heel Raises 10 reps   ? Toe Raise 10 reps   ? ?  ?  ? ?  ? ? ? ? ? ? ? PT Short Term Goals - 02/09/22 0929   ? ?  ? PT SHORT TERM GOAL #1  ? Title Pt will be independent with initial HEP.   ? Time 3   ? Period Weeks   ? Status On-going   ? Target Date 02/25/22   ?  ? PT SHORT TERM GOAL #2  ? Title Complete Berg and/or DGI to further assess balance deficits   ? Time 2   ? Period Weeks   ? Status Achieved   ? Target Date 02/18/22   ? ?  ?  ? ?  ? ? ? ? PT Long Term Goals - 02/09/22 0929   ? ?  ? PT LONG TERM GOAL #1  ? Title Pt will be independent with advanced HEP.   ? Time 8   ? Period Weeks   ? Status On-going   ? Target Date 04/01/22   ?  ? PT LONG TERM GOAL #2  ? Title Pt. will demonstrate improved LLE strength to 4+/5 throughout to decrease risk of falls.   ? Baseline 3/5-3+/5 LLE strength, increased pain   ? Time 8   ? Period Weeks   ? Status On-going   ? Target Date 04/01/22   ?  ? PT LONG TERM GOAL #3  ? Title Pt. will demonstrate improved endurance by completing 1100 ft in 6 minutes without increased pain or DOE.   ? Baseline 878 ft,well below average for age/sex, increased L hip pain   ? Time 8   ? Period Weeks   ? Status On-going   ? Target Date 04/01/22   ?  ? PT LONG TERM GOAL #4  ? Title Pt. will demonstrate improved functional strength by completing 5xSTS in <15 seconds.   ? Baseline 20 sec, increased L knee pain   ? Time 8   ? Period Weeks   ? Status On-going   ? Target Date 04/01/22   ?  ? PT LONG TERM GOAL #5  ? Title Patient to report 75% improvement in pain.   ? Baseline reports back, L hip, L knee > R knee pain with activity   ? Time 8   ? Period Weeks   ? Status On-going   ?  Target Date 04/01/22   ?  ? PT LONG TERM GOAL #6  ? Title Patient will demonstrate decreased fall risk with goal TBD by Berg/DGI   ? Baseline FGA - 24/30, Berg 53/56.  Not at increased risk of falls by these measures.   ? Time 8    ? Period Weeks   ? Status Achieved   ? Target Date 04/01/22   ? ?  ?  ? ?  ? ? ? ? ? ? ? ? Plan - 02/20/22 1109   ? ? Clinical Impression Statement Pt tolerated progression of balance exercises well, incorporating more Tai Chi based movements, which he enjoys.  Challenged with head turns with one foot on stool.  CGA needed throughout balance exercises, but no large LOB today.  Also trialed more resistance for muscle strengthening today, tolerated hamstring curls and leg press, but had increased R knee pain with leg extensions so stopped this exercise.  Intermittant complaint of R > L knee pain throughout session, but as soon as activity stopped pain also ceased.  He would benefit from continued skilled therapy.   ? Personal Factors and Comorbidities Age;Comorbidity 3+;Time since onset of injury/illness/exacerbation   ? Comorbidities Pacemaker, psoriatic arthritis, osteoporosis, Afib (anticoagulated), HOH, sick sinus syndrome, high cholesterol, hypothyroidism, OSA on CPAP, graves disease, bil arthroscopic knee surgery, left ventricular hypertropy, iliac aneurysm, neuropathy, CKD   ? Examination-Activity Limitations Sit;Bend;Squat;Carry;Stand;Stairs;Transfers;Dressing;Lift;Locomotion Level;Bed Mobility   ? Examination-Participation Restrictions Church;Cleaning;Shop;Community Activity;Driving;Yard Work;Laundry;Meal Prep   ? Stability/Clinical Decision Making Evolving/Moderate complexity   ? Rehab Potential Good   ? PT Frequency 2x / week   ? PT Duration 8 weeks   ? PT Treatment/Interventions ADLs/Self Care Home Management;Cryotherapy;Moist Heat;Balance training;Therapeutic exercise;Therapeutic activities;Functional mobility training;Stair training;Gait training;Neuromuscular re-education;Patient/family education;Manual techniques;Taping;Energy conservation;Dry needling;Passive range of motion;Joint Manipulations;Spinal Manipulations;Aquatic Therapy   ? PT Next Visit Plan complete BERG and DGI, HEP for LE strengthening    ? Consulted and Agree with Plan of Care Patient   ? ?  ?  ? ?  ? ? ?Patient will benefit from skilled therapeutic intervention in order to improve the following deficits and impairments:  Hypomobility,

## 2022-02-24 ENCOUNTER — Other Ambulatory Visit: Payer: Self-pay

## 2022-02-24 ENCOUNTER — Ambulatory Visit: Payer: Medicare Other

## 2022-02-24 DIAGNOSIS — R2689 Other abnormalities of gait and mobility: Secondary | ICD-10-CM

## 2022-02-24 DIAGNOSIS — M25552 Pain in left hip: Secondary | ICD-10-CM | POA: Diagnosis not present

## 2022-02-24 DIAGNOSIS — R2681 Unsteadiness on feet: Secondary | ICD-10-CM | POA: Diagnosis not present

## 2022-02-24 DIAGNOSIS — G8929 Other chronic pain: Secondary | ICD-10-CM | POA: Diagnosis not present

## 2022-02-24 DIAGNOSIS — M6281 Muscle weakness (generalized): Secondary | ICD-10-CM | POA: Diagnosis not present

## 2022-02-24 DIAGNOSIS — M545 Low back pain, unspecified: Secondary | ICD-10-CM | POA: Diagnosis not present

## 2022-02-24 NOTE — Telephone Encounter (Signed)
LMTCB

## 2022-02-24 NOTE — Therapy (Signed)
[place] ?[facility] ?[address] ?[place], [place], [address] ?Phone: [phone]   Fax:  [phone] ? ?Physical Therapy Treatment ? ?Patient Details  ?Name: Martin Rubio ?MRN: [id number] ?Date of Birth: Apr 08, 1946 ?Referring Provider (PT): [place], [place] E. ? ? ?Encounter Date: 02/24/2022 ? ? PT End of Session - 02/24/22 1557   ? ? Visit Number 5   ? Number of Visits 16   ? Date for PT Re-Evaluation 04/01/22   ? Authorization Type Medicare & [employer] Life   ? PT Start Time 5462   pt late  ? PT Stop Time 1528   ? PT Time Calculation (min) 37 min   ? Activity Tolerance Patient tolerated treatment well;Patient limited by pain   ? Behavior During Therapy [facility] for tasks assessed/performed   ? ?  ?  ? ?  ? ? ?Past Medical History:  ?Diagnosis Date  ? Bronchitis   ? Diverticulosis   ? FH: BPH (benign prostatic hypertrophy)   ? History of Graves' disease 01/19/2018  ? S/p ablation  ? HTN (hypertension)   ? Hyperlipidemia   ? Obesity   ? Psoriatic arthritis ([place])   ? Rheumatoid arthritis ([place]) 08/07/2014  ? Sleep apnea   ? CPAP  ? Thyroid disease   ? ? ?Past Surgical History:  ?Procedure Laterality Date  ? ABDOMINAL AORTOGRAM W/LOWER EXTREMITY N/A 01/14/2022  ? Procedure: ABDOMINAL AORTOGRAM W/LOWER EXTREMITY;  Surgeon: Wellington Hampshire, MD;  Location: [facility];  Service: Cardiovascular;  Laterality: N/A;  ? INGUINAL HERNIA REPAIR    ? bilateral  ? KNEE SURGERY    ? bilateral arthroscopic  ? l foot surgery Left 12/2020  ? PACEMAKER IMPLANT N/A 12/13/2019  ? Procedure: PACEMAKER IMPLANT;  Surgeon: Evans Lance, MD;  Location: [facility];  Service: Cardiovascular;  Laterality: N/A;  ? TRANSURETHRAL RESECTION OF PROSTATE    ? UMBILICAL HERNIA REPAIR    ? ? ?There were no vitals filed for this visit. ? ? Subjective Assessment - 02/24/22 1454   ? ? Subjective Pt reports doing well with new medication.   ? Pertinent History Pace maker 12/13/19, psoriatic arthritis,  osteoporosis, Afib (anticoagulated), HOH, sick sinus syndrome, high cholesterol, hypothyroidism, OSA on CPAP, graves disease, bil arthroscopic knee surgery, left ventricular hypertropy, iliac aneurysm, neuropathy, CKD, osteoarthritis in bil knees   ? Diagnostic tests 04/25/20 lumbar xray: Mild degenerative disc disease is noted at L3-4. No acuteabnormality seen in the lumbar spine.; 04/25/20 thoracic xray: No acute abnormality seen in the thoracic spine. Multilevel degenerative disc disease is noted.   ? Patient Stated Goals build stamina   ? Currently in Pain? No/denies   ? ?  ?  ? ?  ? ? ? ? ? ? ? ? ? ? ? ? ? ? ? ? ? ? ? ? [facility] Adult PT Treatment/Exercise - 02/24/22 0001   ? ?  ? Lumbar Exercises: Aerobic  ? Stationary Bike L3 x 6 min   ?  ? Lumbar Exercises: Machines for Strengthening  ? Cybex Knee Extension 10# x 10   ? Cybex Knee Flexion 20# 3x10   ?  ? Knee/Hip Exercises: Standing  ? Hip Flexion Stengthening;Both;10 reps   ? Hip Flexion Limitations counter support, YTB   ? Hip Abduction Stengthening;Both;10 reps;Knee straight   ? Abduction Limitations counter support, YTB   ? Hip Extension Stengthening;10 reps;Knee straight   ? Extension Limitations counter support, YTB   ? ?  ?  ? ?  ? ? ? ? ? ?  Balance Exercises - 02/24/22 0001   ? ?  ? Balance Exercises: Standing  ? Stepping Strategy Anterior;Lateral;Limitations   ? Stepping Strategy Limitations fwd and lateral steps over green yoga mat, UE support   ? Tandem Gait Forward;Retro;Intermittent upper extremity support;3 reps   3x63f along counter; LOB with with retro tandem  ? Heel Raises Both;20 reps   ? Toe Raise Both;20 reps   ? Other Standing Exercises braiding 3x196falong counter 2 hand support   ? ?  ?  ? ?  ? ? ? ? ? ? ? PT Short Term Goals - 02/24/22 1703   ? ?  ? PT SHORT TERM GOAL #1  ? Title Pt will be independent with initial HEP.   ? Time 3   ? Period Weeks   ? Status Achieved   ? Target Date 02/25/22   ?  ? PT SHORT TERM GOAL #2  ? Title  Complete Berg and/or DGI to further assess balance deficits   ? Time 2   ? Period Weeks   ? Status Achieved   ? Target Date 02/18/22   ? ?  ?  ? ?  ? ? ? ? PT Long Term Goals - 02/09/22 0929   ? ?  ? PT LONG TERM GOAL #1  ? Title Pt will be independent with advanced HEP.   ? Time 8   ? Period Weeks   ? Status On-going   ? Target Date 04/01/22   ?  ? PT LONG TERM GOAL #2  ? Title Pt. will demonstrate improved LLE strength to 4+/5 throughout to decrease risk of falls.   ? Baseline 3/5-3+/5 LLE strength, increased pain   ? Time 8   ? Period Weeks   ? Status On-going   ? Target Date 04/01/22   ?  ? PT LONG TERM GOAL #3  ? Title Pt. will demonstrate improved endurance by completing 1100 ft in 6 minutes without increased pain or DOE.   ? Baseline 878 ft,well below average for age/sex, increased L hip pain   ? Time 8   ? Period Weeks   ? Status On-going   ? Target Date 04/01/22   ?  ? PT LONG TERM GOAL #4  ? Title Pt. will demonstrate improved functional strength by completing 5xSTS in <15 seconds.   ? Baseline 20 sec, increased L knee pain   ? Time 8   ? Period Weeks   ? Status On-going   ? Target Date 04/01/22   ?  ? PT LONG TERM GOAL #5  ? Title Patient to report 75% improvement in pain.   ? Baseline reports back, L hip, L knee > R knee pain with activity   ? Time 8   ? Period Weeks   ? Status On-going   ? Target Date 04/01/22   ?  ? PT LONG TERM GOAL #6  ? Title Patient will demonstrate decreased fall risk with goal TBD by Berg/DGI   ? Baseline FGA - 24/30, Berg 53/56.  Not at increased risk of falls by these measures.   ? Time 8   ? Period Weeks   ? Status Achieved   ? Target Date 04/01/22   ? ?  ?  ? ?  ? ? ? ? ? ? ? ? Plan - 02/24/22 1559   ? ? Clinical Impression Statement Pt arrived late to session. He did well with the interventions overall. Some pain noted with leg ext,  even when advising him to avoid TKE after 10 reps pt wanted to defer. Pt had some LOB with braiding and backwards tandem gait. Able to progress  standing hip exercises to YTB today.  He reported that he is going to doctor on Thurs for CT scan.   ? Personal Factors and Comorbidities Age;Comorbidity 3+;Time since onset of injury/illness/exacerbation   ? Comorbidities Pacemaker, psoriatic arthritis, osteoporosis, Afib (anticoagulated), HOH, sick sinus syndrome, high cholesterol, hypothyroidism, OSA on CPAP, graves disease, bil arthroscopic knee surgery, left ventricular hypertropy, iliac aneurysm, neuropathy, CKD   ? PT Frequency 2x / week   ? PT Duration 8 weeks   ? PT Treatment/Interventions ADLs/Self Care Home Management;Cryotherapy;Moist Heat;Balance training;Therapeutic exercise;Therapeutic activities;Functional mobility training;Stair training;Gait training;Neuromuscular re-education;Patient/family education;Manual techniques;Taping;Energy conservation;Dry needling;Passive range of motion;Joint Manipulations;Spinal Manipulations;Aquatic Therapy   ? PT Next Visit Plan HEP for LE strengthening   ? Consulted and Agree with Plan of Care Patient   ? ?  ?  ? ?  ? ? ?Patient will benefit from skilled therapeutic intervention in order to improve the following deficits and impairments:  Hypomobility, Decreased activity tolerance, Decreased strength, Increased fascial restricitons, Pain, Difficulty walking, Increased muscle spasms, Cardiopulmonary status limiting activity, Improper body mechanics, Decreased range of motion, Impaired flexibility, Postural dysfunction, Abnormal gait, Decreased balance, Decreased mobility, Decreased scar mobility, Decreased endurance ? ?Visit Diagnosis: ?Unsteadiness on feet ? ?Other abnormalities of gait and mobility ? ?Muscle weakness (generalized) ? ?Chronic left-sided low back pain without sciatica ? ? ? ? ?Problem List ?Patient Active Problem List  ? Diagnosis Date Noted  ? Aortic atherosclerosis ([place]) 03/18/2021  ? History of COVID-19 02/10/2021  ? Bruit 02/09/2021  ? SOB (shortness of breath) 02/09/2021  ? Aortic valve  sclerosis 02/09/2021  ? Secondary hypercoagulable state ([place]) 03/05/2020  ? Educated about COVID-19 virus infection 01/27/2020  ? PCP notes >>>>>>>>>>>>>>> 01/10/2020  ? Age related osteoporosis 01/10/2020  ? CKD (

## 2022-02-25 ENCOUNTER — Ambulatory Visit: Payer: Self-pay

## 2022-02-25 DIAGNOSIS — Z6830 Body mass index (BMI) 30.0-30.9, adult: Secondary | ICD-10-CM | POA: Diagnosis not present

## 2022-02-25 DIAGNOSIS — M1991 Primary osteoarthritis, unspecified site: Secondary | ICD-10-CM | POA: Diagnosis not present

## 2022-02-25 DIAGNOSIS — L299 Pruritus, unspecified: Secondary | ICD-10-CM | POA: Diagnosis not present

## 2022-02-25 DIAGNOSIS — E669 Obesity, unspecified: Secondary | ICD-10-CM | POA: Diagnosis not present

## 2022-02-25 DIAGNOSIS — L4050 Arthropathic psoriasis, unspecified: Secondary | ICD-10-CM | POA: Diagnosis not present

## 2022-02-25 DIAGNOSIS — M79671 Pain in right foot: Secondary | ICD-10-CM | POA: Diagnosis not present

## 2022-02-25 DIAGNOSIS — M25562 Pain in left knee: Secondary | ICD-10-CM | POA: Diagnosis not present

## 2022-02-25 DIAGNOSIS — L409 Psoriasis, unspecified: Secondary | ICD-10-CM | POA: Diagnosis not present

## 2022-02-25 DIAGNOSIS — M25561 Pain in right knee: Secondary | ICD-10-CM | POA: Diagnosis not present

## 2022-02-25 DIAGNOSIS — M5136 Other intervertebral disc degeneration, lumbar region: Secondary | ICD-10-CM | POA: Diagnosis not present

## 2022-02-25 NOTE — Chronic Care Management (AMB) (Signed)
? ?  02/25/2022 ? ?Martin Rubio ?[birth year] ?[id number] ? ? ?[name] is a 76 year old male who is a primary care patient of Dr. Larose Kells. I reached out by phone today to discuss Care Coordination services. ? ?[name] was given information about Care Management services today including:  ?Care Management services includes personalized support from designated clinical staff supervised by his physician, including individualized plan of care and coordination with other care providers ?24/7 contact phone numbers for assistance for urgent and routine care needs. ?The patient may stop case management services at any time by phone call to the office staff. ? ?[name] reports a previous need for medication assistance. However, he reports he is engaged with clinical pharmacist with cardiologist office and he currently is able to afford all his medications and denies any needs at this time Patient did not agree to enrollment in care management services and does not wish to consider at this time.  ? ?Follow up Plan: No follow up Indicated. ? ?Thea Silversmith, RN, MSN, BSN, CCM ?Care Management Coordinator ?[facility] ?[phone]  ? ?

## 2022-02-26 ENCOUNTER — Other Ambulatory Visit: Payer: Medicare Other

## 2022-02-26 ENCOUNTER — Other Ambulatory Visit: Payer: Self-pay

## 2022-02-26 ENCOUNTER — Encounter: Payer: Medicare Other | Admitting: Physical Therapy

## 2022-02-26 ENCOUNTER — Ambulatory Visit
Admission: RE | Admit: 2022-02-26 | Discharge: 2022-02-26 | Disposition: A | Discharge status: Home / Self Care | Payer: Medicare Other | Source: Ambulatory Visit | Attending: Vascular Surgery | Admitting: Vascular Surgery

## 2022-02-26 DIAGNOSIS — K5730 Diverticulosis of large intestine without perforation or abscess without bleeding: Secondary | ICD-10-CM | POA: Diagnosis not present

## 2022-02-26 DIAGNOSIS — I739 Peripheral vascular disease, unspecified: Secondary | ICD-10-CM | POA: Diagnosis not present

## 2022-02-26 DIAGNOSIS — I700 Atherosclerosis of aorta: Secondary | ICD-10-CM

## 2022-02-26 DIAGNOSIS — I723 Aneurysm of iliac artery: Secondary | ICD-10-CM

## 2022-02-26 DIAGNOSIS — I749 Embolism and thrombosis of unspecified artery: Secondary | ICD-10-CM | POA: Diagnosis not present

## 2022-02-26 MED ORDER — IOPAMIDOL (ISOVUE-370) INJECTION 76%
80.0000 mL | Freq: Once | INTRAVENOUS | Status: AC | PRN
  Administered 2022-02-26: 80 mL via INTRAVENOUS

## 2022-02-26 NOTE — Progress Notes (Signed)
?Office Note  ? ? ? ?CC: History of left common iliac artery aneurysm with claudication ?Requesting Provider:  Colon Branch, MD ? ?HPI: Martin Rubio is a 76 y.o. (March 10, 1946) male presenting at the request of Dr. Kathlyn Sacramento status post angiogram for bilateral lower extremity claudication demonstrating bilateral iliac artery stenosis, left common iliac artery aneurysm. ? ?On exam, Min was doing well, accompanied by his wife.  A [place] native, he moved down to [place] years ago.  He is an Chief Financial Officer by trade.  Over the last year, Alante is appreciated pain in his feet and legs.  This can be appreciated after standing on his feet all day, or after ambulating for a significant amount of time.  He has gained weight recently and has not been as active as he would like to be.  His wife would like for both of them to increase their activity levels. ? ?[name] was unaware of his left-sided iliac artery aneurysm prior to angiogram.  He notes moderate claudication, and denies lower extremity rest pain, ischemic tissue loss.  He is very involved in [place], and has a strong faith. ?He cannot take statins due to myalgia, and has a history of GI ulcer, and therefore does not take aspirin. ? ?The pt is not on a statin for cholesterol management.  ?The pt is not on a daily aspirin.   Other AC:  Xarelto ?The pt is  on medication for hypertension.   ?The pt is not diabetic.  ?Tobacco hx:  - ? ?Past Medical History:  ?Diagnosis Date  ? Bronchitis   ? Diverticulosis   ? FH: BPH (benign prostatic hypertrophy)   ? History of Graves' disease 01/19/2018  ? S/p ablation  ? HTN (hypertension)   ? Hyperlipidemia   ? Obesity   ? Psoriatic arthritis ([place])   ? Rheumatoid arthritis ([place]) 08/07/2014  ? Sleep apnea   ? CPAP  ? Thyroid disease   ? ? ?Past Surgical History:  ?Procedure Laterality Date  ? ABDOMINAL AORTOGRAM W/LOWER EXTREMITY N/A 01/14/2022  ? Procedure: ABDOMINAL AORTOGRAM W/LOWER EXTREMITY;  Surgeon: [place] Hampshire,  MD;  Location: [facility];  Service: Cardiovascular;  Laterality: N/A;  ? INGUINAL HERNIA REPAIR    ? bilateral  ? KNEE SURGERY    ? bilateral arthroscopic  ? l foot surgery Left 12/2020  ? PACEMAKER IMPLANT N/A 12/13/2019  ? Procedure: PACEMAKER IMPLANT;  Surgeon: Evans Lance, MD;  Location: [facility];  Service: Cardiovascular;  Laterality: N/A;  ? TRANSURETHRAL RESECTION OF PROSTATE    ? UMBILICAL HERNIA REPAIR    ? ? ?Social History  ? ?Socioeconomic History  ? Marital status: Married  ?  Spouse name: Not on file  ? Number of children: 2  ? Years of education: Not on file  ? Highest education level: Not on file  ?Occupational History  ? Occupation: Retired  ?  Employer: [employer]  ?Tobacco Use  ? Smoking status: Former  ?  Types: Cigarettes  ? Smokeless tobacco: Never  ? Tobacco comments:  ?  Quit 46 years ago.  ?Vaping Use  ? Vaping Use: Never used  ?Substance and Sexual Activity  ? Alcohol use: No  ? Drug use: No  ? Sexual activity: Not on file  ?Other Topics Concern  ? Not on file  ?Social History Narrative  ? Lives with wife.  ? ?Social Determinants of Health  ? ?Financial Resource Strain: Low Risk   ?  Difficulty of Paying Living Expenses: Not hard at all  ?Food Insecurity: No Food Insecurity  ? Worried About Charity fundraiser in the Last Year: Never true  ? Ran Out of Food in the Last Year: Never true  ?Transportation Needs: No Transportation Needs  ? Lack of Transportation (Medical): No  ? Lack of Transportation (Non-Medical): No  ?Physical Activity: Inactive  ? Days of Exercise per Week: 0 days  ? Minutes of Exercise per Session: 0 min  ?Stress: No Stress Concern Present  ? Feeling of Stress : Not at all  ?Social Connections: Moderately Integrated  ? Frequency of Communication with Friends and Family: Three times a week  ? Frequency of Social Gatherings with Friends and Family: Twice a week  ? Attends Religious Services: More than 4 times per year  ? Active Member of Clubs or  Organizations: No  ? Attends Archivist Meetings: Never  ? Marital Status: Married  ?Intimate Partner Violence: Not At Risk  ? Fear of Current or Ex-Partner: No  ? Emotionally Abused: No  ? Physically Abused: No  ? Sexually Abused: No  ? ?Family History  ?Problem Relation Age of Onset  ? CAD Mother 42  ?     CABG  ? CAD Father 38  ?     Died age 37  ? Diabetes Brother   ? Prostate cancer Neg Hx   ? Colon cancer Neg Hx   ? ? ?Current Outpatient Medications  ?Medication Sig Dispense Refill  ? acetaminophen (TYLENOL) 325 MG tablet Take 650 mg by mouth every 6 (six) hours as needed for mild pain.    ? Alirocumab (PRALUENT) 150 MG/ML SOAJ Inject 150 mg into the skin every 14 (fourteen) days. (Patient not taking: Reported on 02/17/2022) 2 mL 11  ? amLODipine (NORVASC) 5 MG tablet TAKE 1 TABLET (5 MG TOTAL) BY MOUTH DAILY. 90 tablet 3  ? Ascorbic Acid (VITAMIN C ADULT GUMMIES PO) Take 1 each by mouth daily.    ? Cholecalciferol (VITAMIN D) 125 MCG (5000 UT) CAPS Take 5,000 Units by mouth daily.     ? Cyanocobalamin (VITAMIN B 12) 500 MCG TABS Take 1,000 mcg by mouth daily.     ? cycloSPORINE (RESTASIS) 0.05 % ophthalmic emulsion Place 1 drop into both eyes 2 (two) times daily.    ? diclofenac Sodium (VOLTAREN) 1 % GEL Apply 4 g topically in the morning and at bedtime. 100 g 6  ? dofetilide (TIKOSYN) 250 MCG capsule TAKE ONE CAPSULE BY MOUTH TWICE A DAY 180 capsule 2  ? etanercept (ENBREL) 50 MG/ML injection Inject 50 mg into the skin once a week.    ? ferrous fumarate (HEMOCYTE - 106 MG FE) 325 (106 Fe) MG TABS tablet Take 1 tablet (106 mg of iron total) by mouth 2 (two) times daily. 60 tablet 3  ? fluticasone (FLONASE) 50 MCG/ACT nasal spray Place 1 spray into both nostrils as needed for allergies. (Patient not taking: Reported on 02/17/2022)    ? folic acid (FOLVITE) 962 MCG tablet Take 800 mcg by mouth daily.    ? gabapentin (NEURONTIN) 100 MG capsule Take 1 capsule (100 mg total) by mouth 3 (three) times daily.  (Patient not taking: Reported on 02/17/2022) 90 capsule 1  ? hydrOXYzine (VISTARIL) 25 MG capsule TAKE 1 CAPSULE (25 MG TOTAL) BY MOUTH EVERY 8 (EIGHT) HOURS AS NEEDED. 60 capsule 1  ? Krill Oil 350 MG CAPS Take 350 mg by mouth daily. (Patient not  taking: Reported on 02/17/2022)    ? levothyroxine (SYNTHROID) 150 MCG tablet Take 1 tablet (150 mcg total) by mouth daily before breakfast. 30 tablet 0  ? losartan (COZAAR) 50 MG tablet TAKE 1 TABLET (50 MG TOTAL) BY MOUTH IN THE MORNING AND AT BEDTIME. (Patient taking differently: Take 25 mg by mouth in the morning and at bedtime.) 180 tablet 3  ? MAGNESIUM GLYCINATE PO Take 1 tablet by mouth daily.    ? meclizine (ANTIVERT) 25 MG tablet Take 25 mg by mouth 3 (three) times daily as needed for dizziness. (Patient not taking: Reported on 02/17/2022)    ? Melatonin 5 MG TABS Take 5 mg by mouth at bedtime.    ? omeprazole (PRILOSEC) 20 MG capsule Take 20 mg by mouth daily. Buys the [employer] brand.    ? Rivaroxaban (XARELTO) 15 MG TABS tablet Take 1 tablet by mouth daily, with a full meal. 90 tablet 1  ? tadalafil, PAH, (ADCIRCA) 20 MG tablet Take 20 mg by mouth daily as needed.    ? tamsulosin (FLOMAX) 0.4 MG CAPS capsule Take 0.4 mg by mouth at bedtime.  (Patient not taking: Reported on 02/17/2022)    ? triamcinolone cream (KENALOG) 0.1 % Apply 1 application topically daily as needed for itching.    ? ?No current facility-administered medications for this visit.  ? ? ?Allergies  ?Allergen Reactions  ? Codeine   ?  Tension, nasty feeling ?  ? Nsaids Other (See Comments)  ?  Renal insufficiency  ? Prednisone Other (See Comments)  ?  Unknown/ sometimes takes with lower dose  ? Statins Other (See Comments)  ?  Joint pain ?  ? Sulfa Antibiotics Other (See Comments)  ?  Joint pain  ? ? ? ?REVIEW OF SYSTEMS:  ? ?'[X]'$  denotes positive finding, '[ ]'$  denotes negative finding ?Cardiac  Comments:  ?Chest pain or chest pressure:    ?Shortness of breath upon exertion:    ?Short of breath when  lying flat:    ?Irregular heart rhythm:    ?    ?Vascular    ?Pain in calf, thigh, or hip brought on by ambulation:    ?Pain in feet at night that wakes you up from your sleep:     ?Blood clot in your vein

## 2022-02-27 ENCOUNTER — Encounter: Payer: Self-pay | Admitting: Vascular Surgery

## 2022-02-27 ENCOUNTER — Ambulatory Visit (INDEPENDENT_AMBULATORY_CARE_PROVIDER_SITE_OTHER): Payer: Medicare Other | Admitting: Vascular Surgery

## 2022-02-27 VITALS — BP 155/91 | HR 59 | Temp 98.1°F | Resp 20 | Ht 70.0 in | Wt 207.9 lb

## 2022-02-27 DIAGNOSIS — I723 Aneurysm of iliac artery: Secondary | ICD-10-CM | POA: Diagnosis not present

## 2022-02-27 DIAGNOSIS — I70219 Atherosclerosis of native arteries of extremities with intermittent claudication, unspecified extremity: Secondary | ICD-10-CM | POA: Diagnosis not present

## 2022-02-27 DIAGNOSIS — I700 Atherosclerosis of aorta: Secondary | ICD-10-CM | POA: Diagnosis not present

## 2022-03-03 ENCOUNTER — Ambulatory Visit: Payer: Medicare Other | Admitting: Physical Therapy

## 2022-03-03 ENCOUNTER — Other Ambulatory Visit: Payer: Self-pay

## 2022-03-03 ENCOUNTER — Other Ambulatory Visit: Payer: Self-pay | Admitting: Podiatry

## 2022-03-03 ENCOUNTER — Encounter: Payer: Self-pay | Admitting: Physical Therapy

## 2022-03-03 DIAGNOSIS — R2681 Unsteadiness on feet: Secondary | ICD-10-CM | POA: Diagnosis not present

## 2022-03-03 DIAGNOSIS — R2689 Other abnormalities of gait and mobility: Secondary | ICD-10-CM | POA: Diagnosis not present

## 2022-03-03 DIAGNOSIS — M545 Low back pain, unspecified: Secondary | ICD-10-CM | POA: Diagnosis not present

## 2022-03-03 DIAGNOSIS — M25552 Pain in left hip: Secondary | ICD-10-CM

## 2022-03-03 DIAGNOSIS — M62830 Muscle spasm of back: Secondary | ICD-10-CM

## 2022-03-03 DIAGNOSIS — G8929 Other chronic pain: Secondary | ICD-10-CM

## 2022-03-03 DIAGNOSIS — M6281 Muscle weakness (generalized): Secondary | ICD-10-CM | POA: Diagnosis not present

## 2022-03-03 DIAGNOSIS — R29898 Other symptoms and signs involving the musculoskeletal system: Secondary | ICD-10-CM

## 2022-03-03 DIAGNOSIS — R293 Abnormal posture: Secondary | ICD-10-CM

## 2022-03-03 DIAGNOSIS — M25652 Stiffness of left hip, not elsewhere classified: Secondary | ICD-10-CM

## 2022-03-03 NOTE — Therapy (Signed)
[facility] ?[address] ?[place], [place], [address] ?Phone: [phone]   Fax:  [phone] ? ?Physical Therapy Treatment ? ?Patient Details  ?Name: Martin Rubio ?MRN: [id number] ?Date of Birth: 06-04-1946 ?Referring Provider (PT): [place], [place] E. ? ? ?Encounter Date: 03/03/2022 ? ? PT End of Session - 03/03/22 1350   ? ? Visit Number 6   ? Number of Visits 16   ? Date for PT Re-Evaluation 04/01/22   ? Authorization Type Medicare & [employer] Life   ? PT Start Time 1350   ? PT Stop Time 1428   ? PT Time Calculation (min) 38 min   ? Activity Tolerance Patient tolerated treatment well   ? Behavior During Therapy [facility] for tasks assessed/performed   ? ?  ?  ? ?  ? ? ?Past Medical History:  ?Diagnosis Date  ? Bronchitis   ? Diverticulosis   ? FH: BPH (benign prostatic hypertrophy)   ? History of Graves' disease 01/19/2018  ? S/p ablation  ? HTN (hypertension)   ? Hyperlipidemia   ? Obesity   ? Psoriatic arthritis ([place])   ? Rheumatoid arthritis ([place]) 08/07/2014  ? Sleep apnea   ? CPAP  ? Thyroid disease   ? ? ?Past Surgical History:  ?Procedure Laterality Date  ? ABDOMINAL AORTOGRAM W/LOWER EXTREMITY N/A 01/14/2022  ? Procedure: ABDOMINAL AORTOGRAM W/LOWER EXTREMITY;  Surgeon: [place] Hampshire, MD;  Location: [facility];  Service: Cardiovascular;  Laterality: N/A;  ? INGUINAL HERNIA REPAIR    ? bilateral  ? KNEE SURGERY    ? bilateral arthroscopic  ? l foot surgery Left 12/2020  ? PACEMAKER IMPLANT N/A 12/13/2019  ? Procedure: PACEMAKER IMPLANT;  Surgeon: Evans Lance, MD;  Location: [facility];  Service: Cardiovascular;  Laterality: N/A;  ? TRANSURETHRAL RESECTION OF PROSTATE    ? UMBILICAL HERNIA REPAIR    ? ? ?There were no vitals filed for this visit. ? ? Subjective Assessment - 03/03/22 1352   ? ? Subjective Pt reports his back is doing pretty well today. Today I'm wobbly. I didn't sleep well last night. I joined the gym yesterday.   ?  Pertinent History Pace maker 12/13/19, psoriatic arthritis, osteoporosis, Afib (anticoagulated), HOH, sick sinus syndrome, high cholesterol, hypothyroidism, OSA on CPAP, graves disease, bil arthroscopic knee surgery, left ventricular hypertropy, iliac aneurysm, neuropathy, CKD, osteoarthritis in bil knees   ? Patient Stated Goals build stamina   ? Currently in Pain? No/denies   ? ?  ?  ? ?  ? ? ? ? ? ? ? ? ? ? ? ? ? ? ? ? ? ? ? ? [facility] Adult PT Treatment/Exercise - 03/03/22 0001   ? ?  ? Ambulation/Gait  ? Ambulation/Gait Yes   ? Ambulation/Gait Assistance 7: Independent   ? Ambulation Distance (Feet) 40 Feet   ? Gait Pattern Within Functional Limits   ? Ambulation Surface Level   ? Gait Comments farmer carry 7# each hand 2x10f; single arm carry x 20 ft B; OH single arm carry x 219fea   deviation of gait with OH in R UE  ?  ? Lumbar Exercises: Aerobic  ? Stationary Bike L4 x 6 min   ?  ? Lumbar Exercises: Machines for Strengthening  ? Cybex Knee Extension 5# 2x10   ? Cybex Knee Flexion 35# 3x10   ?  ? Knee/Hip Exercises: Standing  ? Hip Flexion Stengthening;Both;Knee  straight;20 reps   ? Hip Flexion Limitations counter support, RTB   ? Hip Abduction Stengthening;Both;Knee straight;20 reps   ? Abduction Limitations counter support, RTB   ? Hip Extension Stengthening;Knee straight;20 reps   ? Extension Limitations counter support, RTB   ? ?  ?  ? ?  ? ? ? ? ? ? Balance Exercises - 03/03/22 0001   ? ?  ? Balance Exercises: Standing  ? Standing Eyes Opened Head turns;Foam/compliant surface;Wide (BOA);Narrow base of support (BOS)   ? Standing Eyes Opened Limitations no signiifcant limitations   ? Standing Eyes Closed Narrow base of support (BOS);Wide (BOA);Foam/compliant surface   ? Standing Eyes Closed Limitations no significant limitations   ? Tandem Stance 15 secs   ? SLS Solid surface;4 reps   max 7 sec B  ? Tandem Gait Forward;Retro;Intermittent upper extremity support;5 reps;1 rep   3x88f along counter ea; one LOB  with with retro tandem  ? ?  ?  ? ?  ? ? ? ? ? ? ? PT Short Term Goals - 02/24/22 1703   ? ?  ? PT SHORT TERM GOAL #1  ? Title Pt will be independent with initial HEP.   ? Time 3   ? Period Weeks   ? Status Achieved   ? Target Date 02/25/22   ?  ? PT SHORT TERM GOAL #2  ? Title Complete Berg and/or DGI to further assess balance deficits   ? Time 2   ? Period Weeks   ? Status Achieved   ? Target Date 02/18/22   ? ?  ?  ? ?  ? ? ? ? PT Long Term Goals - 02/09/22 0929   ? ?  ? PT LONG TERM GOAL #1  ? Title Pt will be independent with advanced HEP.   ? Time 8   ? Period Weeks   ? Status On-going   ? Target Date 04/01/22   ?  ? PT LONG TERM GOAL #2  ? Title Pt. will demonstrate improved LLE strength to 4+/5 throughout to decrease risk of falls.   ? Baseline 3/5-3+/5 LLE strength, increased pain   ? Time 8   ? Period Weeks   ? Status On-going   ? Target Date 04/01/22   ?  ? PT LONG TERM GOAL #3  ? Title Pt. will demonstrate improved endurance by completing 1100 ft in 6 minutes without increased pain or DOE.   ? Baseline 878 ft,well below average for age/sex, increased L hip pain   ? Time 8   ? Period Weeks   ? Status On-going   ? Target Date 04/01/22   ?  ? PT LONG TERM GOAL #4  ? Title Pt. will demonstrate improved functional strength by completing 5xSTS in <15 seconds.   ? Baseline 20 sec, increased L knee pain   ? Time 8   ? Period Weeks   ? Status On-going   ? Target Date 04/01/22   ?  ? PT LONG TERM GOAL #5  ? Title Patient to report 75% improvement in pain.   ? Baseline reports back, L hip, L knee > R knee pain with activity   ? Time 8   ? Period Weeks   ? Status On-going   ? Target Date 04/01/22   ?  ? PT LONG TERM GOAL #6  ? Title Patient will demonstrate decreased fall risk with goal TBD by Berg/DGI   ? Baseline FGA - 24/30, BMerrilee Jansky  53/56.  Not at increased risk of falls by these measures.   ? Time 8   ? Period Weeks   ? Status Achieved   ? Target Date 04/01/22   ? ?  ?  ? ?  ? ? ? ? ? ? ? ? Plan - 03/03/22 1541    ? ? Clinical Impression Statement [name] did well with both strengthening and balance activities with less LOB than previously. Gait pattern is improving. He had some deviation with OH carry in the RUE, but otherwise normal gait with and without added wt. Still mild deviations with retro gait.   ? PT Frequency 2x / week   ? PT Duration 8 weeks   ? PT Treatment/Interventions ADLs/Self Care Home Management;Cryotherapy;Moist Heat;Balance training;Therapeutic exercise;Therapeutic activities;Functional mobility training;Stair training;Gait training;Neuromuscular re-education;Patient/family education;Manual techniques;Taping;Energy conservation;Dry needling;Passive range of motion;Joint Manipulations;Spinal Manipulations;Aquatic Therapy   ? PT Next Visit Plan HEP for LE strengthening   ? Consulted and Agree with Plan of Care Patient   ? ?  ?  ? ?  ? ? ?Patient will benefit from skilled therapeutic intervention in order to improve the following deficits and impairments:  Hypomobility, Decreased activity tolerance, Decreased strength, Increased fascial restricitons, Pain, Difficulty walking, Increased muscle spasms, Cardiopulmonary status limiting activity, Improper body mechanics, Decreased range of motion, Impaired flexibility, Postural dysfunction, Abnormal gait, Decreased balance, Decreased mobility, Decreased scar mobility, Decreased endurance ? ?Visit Diagnosis: ?Unsteadiness on feet ? ?Other abnormalities of gait and mobility ? ?Chronic left-sided low back pain without sciatica ? ?Pain in left hip ? ?Stiffness of left hip, not elsewhere classified ? ?Muscle spasm of back ? ?Abnormal posture ? ?Other symptoms and signs involving the musculoskeletal system ? ?Muscle weakness (generalized) ? ? ? ? ?Problem List ?Patient Active Problem List  ? Diagnosis Date Noted  ? Aortic atherosclerosis ([place]) 03/18/2021  ? History of COVID-19 02/10/2021  ? Bruit 02/09/2021  ? SOB (shortness of breath) 02/09/2021  ? Aortic valve  sclerosis 02/09/2021  ? Secondary hypercoagulable state ([place]) 03/05/2020  ? Educated about COVID-19 virus infection 01/27/2020  ? PCP notes >>>>>>>>>>>>>>> 01/10/2020  ? Age related osteoporosis 01/10/2020  ? CKD (ch

## 2022-03-05 DIAGNOSIS — Z20822 Contact with and (suspected) exposure to covid-19: Secondary | ICD-10-CM | POA: Diagnosis not present

## 2022-03-06 ENCOUNTER — Other Ambulatory Visit: Payer: Self-pay | Admitting: *Deleted

## 2022-03-06 DIAGNOSIS — I70219 Atherosclerosis of native arteries of extremities with intermittent claudication, unspecified extremity: Secondary | ICD-10-CM

## 2022-03-06 DIAGNOSIS — I723 Aneurysm of iliac artery: Secondary | ICD-10-CM

## 2022-03-10 ENCOUNTER — Ambulatory Visit: Payer: Medicare Other | Attending: Internal Medicine | Admitting: Physical Therapy

## 2022-03-10 ENCOUNTER — Encounter: Payer: Self-pay | Admitting: Physical Therapy

## 2022-03-10 DIAGNOSIS — M6281 Muscle weakness (generalized): Secondary | ICD-10-CM | POA: Insufficient documentation

## 2022-03-10 DIAGNOSIS — M25552 Pain in left hip: Secondary | ICD-10-CM | POA: Diagnosis not present

## 2022-03-10 DIAGNOSIS — R2689 Other abnormalities of gait and mobility: Secondary | ICD-10-CM | POA: Insufficient documentation

## 2022-03-10 DIAGNOSIS — M25652 Stiffness of left hip, not elsewhere classified: Secondary | ICD-10-CM | POA: Diagnosis not present

## 2022-03-10 DIAGNOSIS — L4050 Arthropathic psoriasis, unspecified: Secondary | ICD-10-CM | POA: Diagnosis not present

## 2022-03-10 DIAGNOSIS — R293 Abnormal posture: Secondary | ICD-10-CM | POA: Insufficient documentation

## 2022-03-10 DIAGNOSIS — G8929 Other chronic pain: Secondary | ICD-10-CM | POA: Insufficient documentation

## 2022-03-10 DIAGNOSIS — R2681 Unsteadiness on feet: Secondary | ICD-10-CM | POA: Insufficient documentation

## 2022-03-10 DIAGNOSIS — M545 Low back pain, unspecified: Secondary | ICD-10-CM | POA: Diagnosis not present

## 2022-03-10 DIAGNOSIS — M62830 Muscle spasm of back: Secondary | ICD-10-CM | POA: Insufficient documentation

## 2022-03-10 DIAGNOSIS — N184 Chronic kidney disease, stage 4 (severe): Secondary | ICD-10-CM | POA: Diagnosis not present

## 2022-03-10 DIAGNOSIS — G609 Hereditary and idiopathic neuropathy, unspecified: Secondary | ICD-10-CM | POA: Diagnosis not present

## 2022-03-10 NOTE — Therapy (Signed)
[facility] ?[address] ?[place], [place], [address] ?Phone: [phone]   Fax:  [phone] ? ?Physical Therapy Treatment ? ?Patient Details  ?Name: Martin Rubio ?MRN: [id number] ?Date of Birth: 12/07/1946 ?Referring Provider (PT): [place], [place] E. ? ? ?Encounter Date: 03/10/2022 ? ? PT End of Session - 03/10/22 1625   ? ? Visit Number 7   ? Number of Visits 16   ? Date for PT Re-Evaluation 04/01/22   ? Authorization Type Medicare & [employer] Life   ? PT Start Time 1619   ? PT Stop Time 1700   ? PT Time Calculation (min) 41 min   ? Activity Tolerance Patient tolerated treatment well   ? Behavior During Therapy [facility] for tasks assessed/performed   ? ?  ?  ? ?  ? ? ?Past Medical History:  ?Diagnosis Date  ? Bronchitis   ? Diverticulosis   ? FH: BPH (benign prostatic hypertrophy)   ? History of Graves' disease 01/19/2018  ? S/p ablation  ? HTN (hypertension)   ? Hyperlipidemia   ? Obesity   ? Psoriatic arthritis ([place])   ? Rheumatoid arthritis ([place]) 08/07/2014  ? Sleep apnea   ? CPAP  ? Thyroid disease   ? ? ?Past Surgical History:  ?Procedure Laterality Date  ? ABDOMINAL AORTOGRAM W/LOWER EXTREMITY N/A 01/14/2022  ? Procedure: ABDOMINAL AORTOGRAM W/LOWER EXTREMITY;  Surgeon: Wellington Hampshire, MD;  Location: [facility];  Service: Cardiovascular;  Laterality: N/A;  ? INGUINAL HERNIA REPAIR    ? bilateral  ? KNEE SURGERY    ? bilateral arthroscopic  ? l foot surgery Left 12/2020  ? PACEMAKER IMPLANT N/A 12/13/2019  ? Procedure: PACEMAKER IMPLANT;  Surgeon: Evans Lance, MD;  Location: [facility];  Service: Cardiovascular;  Laterality: N/A;  ? TRANSURETHRAL RESECTION OF PROSTATE    ? UMBILICAL HERNIA REPAIR    ? ? ?There were no vitals filed for this visit. ? ? Subjective Assessment - 03/10/22 1622   ? ? Subjective Pt. concerned that his podiatrist told him that he had chronic kidney disease, but does not remember primary care ever telling him that.   Reports being able to do a lot more at the gym now, feeling stronger.   ? Pertinent History Pace maker 12/13/19, psoriatic arthritis, osteoporosis, Afib (anticoagulated), HOH, sick sinus syndrome, high cholesterol, hypothyroidism, OSA on CPAP, graves disease, bil arthroscopic knee surgery, left ventricular hypertropy, iliac aneurysm, neuropathy, CKD, osteoarthritis in bil knees   ? Patient Stated Goals build stamina   ? Currently in Pain? Yes   ? Pain Score 5    ? ?  ?  ? ?  ? ? ? ? ? ? ? ? ? ? ? ? ? ? ? ? ? ? ? ? [facility] PT Treatment/Exercise - 03/10/22 0001   ? ?  ? Exercises  ? Exercises Lumbar   ?  ? Lumbar Exercises: Aerobic  ? Nustep L6 x 7 min   ?  ? Lumbar Exercises: Seated  ? Other Seated Lumbar Exercises seated leg extensions with RTB x 10 bil, no new pain   ?  ? Knee/Hip Exercises: Standing  ? Knee Flexion Strengthening;Both;20 reps;Limitations   ? Knee Flexion Limitations counter support, RTB   ? Hip Flexion Stengthening;Both;Knee straight;20 reps   ? Hip Flexion Limitations counter support, RTB   ? Hip Abduction Stengthening;Both;Knee straight;20 reps   ? Abduction Limitations counter support,  RTB   ? Hip Extension Stengthening;Knee straight;20 reps   ? Extension Limitations counter support, RTB   ? Functional Squat 10 reps   ? Functional Squat Limitations sit to stands - preferred over squats due to bil knee pain.   ? Other Standing Knee Exercises church pews x 20   ? ?  ?  ? ?  ? ? ? ? ? ? Balance Exercises - 03/10/22 0001   ? ?  ? Balance Exercises: Standing  ? Standing Eyes Opened Head turns;Foam/compliant surface   ? Standing Eyes Opened Limitations on airex x 30 eyes open, x 10 head nods, x 10 head turns,in corner with SBA for safety, but no significant sway.   ? Standing Eyes Closed Head turns;Foam/compliant surface   ? Standing Eyes Closed Limitations on airex x 30 eyes closed, x 10 head nods, x 10 head turns,in corner with SBA for safety, significant sway with head nods and frequent posterior  LOB, touching wall or minA to correct.   ? Tandem Stance 2 reps;30 secs   R 30 sec, L 10-15 sec  ? SLS Solid surface   R x 30 sec, L x 9 sec, 18 seconds  ? Tandem Gait Forward;Limitations   ? Tandem Gait Limitations in hallway for safety 4 x 8'   ? ?  ?  ? ?  ? ? ? ? ? ? ? PT Short Term Goals - 02/24/22 1703   ? ?  ? PT SHORT TERM GOAL #1  ? Title Pt will be independent with initial HEP.   ? Time 3   ? Period Weeks   ? Status Achieved   ? Target Date 02/25/22   ?  ? PT SHORT TERM GOAL #2  ? Title Complete Berg and/or DGI to further assess balance deficits   ? Time 2   ? Period Weeks   ? Status Achieved   ? Target Date 02/18/22   ? ?  ?  ? ?  ? ? ? ? PT Long Term Goals - 02/09/22 0929   ? ?  ? PT LONG TERM GOAL #1  ? Title Pt will be independent with advanced HEP.   ? Time 8   ? Period Weeks   ? Status On-going   ? Target Date 04/01/22   ?  ? PT LONG TERM GOAL #2  ? Title Pt. will demonstrate improved LLE strength to 4+/5 throughout to decrease risk of falls.   ? Baseline 3/5-3+/5 LLE strength, increased pain   ? Time 8   ? Period Weeks   ? Status On-going   ? Target Date 04/01/22   ?  ? PT LONG TERM GOAL #3  ? Title Pt. will demonstrate improved endurance by completing 1100 ft in 6 minutes without increased pain or DOE.   ? Baseline 878 ft,well below average for age/sex, increased L hip pain   ? Time 8   ? Period Weeks   ? Status On-going   ? Target Date 04/01/22   ?  ? PT LONG TERM GOAL #4  ? Title Pt. will demonstrate improved functional strength by completing 5xSTS in <15 seconds.   ? Baseline 20 sec, increased L knee pain   ? Time 8   ? Period Weeks   ? Status On-going   ? Target Date 04/01/22   ?  ? PT LONG TERM GOAL #5  ? Title Patient to report 75% improvement in pain.   ? Baseline reports back, L  hip, L knee > R knee pain with activity   ? Time 8   ? Period Weeks   ? Status On-going   ? Target Date 04/01/22   ?  ? PT LONG TERM GOAL #6  ? Title Patient will demonstrate decreased fall risk with goal TBD by  Berg/DGI   ? Baseline FGA - 24/30, Berg 53/56.  Not at increased risk of falls by these measures.   ? Time 8   ? Period Weeks   ? Status Achieved   ? Target Date 04/01/22   ? ?  ?  ? ?  ? ? ? ? ? ? ? ? Plan - 03/10/22 1704   ? ? Clinical Impression Statement Pt. tolerated progression of exercises today but did report more hip pain bil as fatigued, but no complaint after rest break.  He did also have more bil knee pain with sit to stands or squats.  His balance is improving, able to hold tandem stance and single leg stance x 30 sec on R side, more challenged on L.  With eyes closed on airex and head movements had large sway and frequent touches on wall posteriorly.  He would benefit from continued skilled therapy.  Given RTB to use at home.   ? PT Frequency 2x / week   ? PT Duration 8 weeks   ? PT Treatment/Interventions ADLs/Self Care Home Management;Cryotherapy;Moist Heat;Balance training;Therapeutic exercise;Therapeutic activities;Functional mobility training;Stair training;Gait training;Neuromuscular re-education;Patient/family education;Manual techniques;Taping;Energy conservation;Dry needling;Passive range of motion;Joint Manipulations;Spinal Manipulations;Aquatic Therapy   ? PT Next Visit Plan HEP for LE strengthening   ? Consulted and Agree with Plan of Care Patient   ? ?  ?  ? ?  ? ? ?Patient will benefit from skilled therapeutic intervention in order to improve the following deficits and impairments:  Hypomobility, Decreased activity tolerance, Decreased strength, Increased fascial restricitons, Pain, Difficulty walking, Increased muscle spasms, Cardiopulmonary status limiting activity, Improper body mechanics, Decreased range of motion, Impaired flexibility, Postural dysfunction, Abnormal gait, Decreased balance, Decreased mobility, Decreased scar mobility, Decreased endurance ? ?Visit Diagnosis: ?Unsteadiness on feet ? ?Other abnormalities of gait and mobility ? ?Chronic left-sided low back pain without  sciatica ? ?Pain in left hip ? ?Stiffness of left hip, not elsewhere classified ? ?Muscle spasm of back ? ? ? ? ?Problem List ?Patient Active Problem List  ? Diagnosis Date Noted  ? Aortic atherosclerosis

## 2022-03-11 ENCOUNTER — Ambulatory Visit: Payer: Medicare Other | Admitting: Physical Therapy

## 2022-03-11 ENCOUNTER — Encounter: Payer: Self-pay | Admitting: Physical Therapy

## 2022-03-11 DIAGNOSIS — R2681 Unsteadiness on feet: Secondary | ICD-10-CM | POA: Diagnosis not present

## 2022-03-11 DIAGNOSIS — G8929 Other chronic pain: Secondary | ICD-10-CM | POA: Diagnosis not present

## 2022-03-11 DIAGNOSIS — M545 Low back pain, unspecified: Secondary | ICD-10-CM | POA: Diagnosis not present

## 2022-03-11 DIAGNOSIS — M25652 Stiffness of left hip, not elsewhere classified: Secondary | ICD-10-CM | POA: Diagnosis not present

## 2022-03-11 DIAGNOSIS — M25552 Pain in left hip: Secondary | ICD-10-CM | POA: Diagnosis not present

## 2022-03-11 DIAGNOSIS — R2689 Other abnormalities of gait and mobility: Secondary | ICD-10-CM | POA: Diagnosis not present

## 2022-03-11 DIAGNOSIS — M62830 Muscle spasm of back: Secondary | ICD-10-CM

## 2022-03-11 NOTE — Therapy (Signed)
[facility] ?[address] ?[place], [place], [address] ?Phone: [phone]   Fax:  [phone] ? ?Physical Therapy Treatment ? ?Patient Details  ?Name: Martin Rubio ?MRN: [id number] ?Date of Birth: 01-04-1946 ?Referring Provider (PT): [place], [place] E. ? ? ?Encounter Date: 03/11/2022 ? ? PT End of Session - 03/11/22 1416   ? ? Visit Number 8   ? Number of Visits 16   ? Date for PT Re-Evaluation 04/01/22   ? Authorization Type Medicare & [employer] Life   ? PT Start Time 1411   ? PT Stop Time 6283   ? PT Time Calculation (min) 32 min   ? Activity Tolerance Patient tolerated treatment well   ? Behavior During Therapy [facility] for tasks assessed/performed   ? ?  ?  ? ?  ? ? ?Past Medical History:  ?Diagnosis Date  ? Bronchitis   ? Diverticulosis   ? FH: BPH (benign prostatic hypertrophy)   ? History of Graves' disease 01/19/2018  ? S/p ablation  ? HTN (hypertension)   ? Hyperlipidemia   ? Obesity   ? Psoriatic arthritis ([place])   ? Rheumatoid arthritis ([place]) 08/07/2014  ? Sleep apnea   ? CPAP  ? Thyroid disease   ? ? ?Past Surgical History:  ?Procedure Laterality Date  ? ABDOMINAL AORTOGRAM W/LOWER EXTREMITY N/A 01/14/2022  ? Procedure: ABDOMINAL AORTOGRAM W/LOWER EXTREMITY;  Surgeon: Wellington Hampshire, MD;  Location: [facility];  Service: Cardiovascular;  Laterality: N/A;  ? INGUINAL HERNIA REPAIR    ? bilateral  ? KNEE SURGERY    ? bilateral arthroscopic  ? l foot surgery Left 12/2020  ? PACEMAKER IMPLANT N/A 12/13/2019  ? Procedure: PACEMAKER IMPLANT;  Surgeon: Evans Lance, MD;  Location: [facility];  Service: Cardiovascular;  Laterality: N/A;  ? TRANSURETHRAL RESECTION OF PROSTATE    ? UMBILICAL HERNIA REPAIR    ? ? ?There were no vitals filed for this visit. ? ? Subjective Assessment - 03/11/22 1413   ? ? Subjective Patient reports he was talking on the phone and fell up the stairs.  He reports he twisted his back.   ? Pertinent History Pace maker  12/13/19, psoriatic arthritis, osteoporosis, Afib (anticoagulated), HOH, sick sinus syndrome, high cholesterol, hypothyroidism, OSA on CPAP, graves disease, bil arthroscopic knee surgery, left ventricular hypertropy, iliac aneurysm, neuropathy, CKD, osteoarthritis in bil knees   ? How long can you sit comfortably? unlimited   ? How long can you stand comfortably? <20 minutes   ? How long can you walk comfortably? can't walk long due to hip and back pain, also SOB   ? Diagnostic tests 04/25/20 lumbar xray: Mild degenerative disc disease is noted at L3-4. No acuteabnormality seen in the lumbar spine.; 04/25/20 thoracic xray: No acute abnormality seen in the thoracic spine. Multilevel degenerative disc disease is noted.   ? Patient Stated Goals build stamina   ? Currently in Pain? Yes   ? Pain Score 2    4-5/10 when moving around  ? Pain Location Back   ? Pain Type Chronic pain   ? ?  ?  ? ?  ? ? ? ? ? ? ? ? ? ? ? ? ? ? ? ? ? ? ? ? [facility] Adult PT Treatment/Exercise - 03/11/22 0001   ? ?  ? Exercises  ? Exercises Lumbar   ?  ? Lumbar Exercises: Stretches  ? Standing Side Bend Limitations   ?  Standing Side Bend Limitations at wall pushing hips towards wall x 10 bil   ? Other Lumbar Stretch Exercise with green Pball seated lumbar flexion stretches x 10   ?  ? Lumbar Exercises: Aerobic  ? Nustep L5 x 6 min   ?  ? Knee/Hip Exercises: Standing  ? Heel Raises Both;2 sets;10 reps   ? ?  ?  ? ?  ? ? ? ? ? ? Balance Exercises - 03/11/22 0001   ? ?  ? Balance Exercises: Standing  ? Stepping Strategy Anterior;Lateral;Limitations   ? Stepping Strategy Limitations diagonal stepping foward and to side twith active reaching to cones placed outside BOS x 10 bil, with CGA for safety   ? Step Ups Forward;4 inch;Limitations;Lateral   ? Step Ups Limitations stepping up and over aerobic step starting 2", added riser second set, no LOB, SBA for safety.  side steppin up 4" x 10 R foot leading, other side reported increased knee pain so stopped.    ? Retro Gait Limitations   ? Retro Gait Limitations farmer carries with 5# weights, walking foward and backwards 4 x 40' with mirror for posture walking backwards, SBA for safety   ? Toe Raise --   ? Other Standing Exercises star excursion pattern - starting 180 deg arc and cross over, progressing to random color and leg taps.  CGA throughout for safety.   ? ?  ?  ? ?  ? ? ? ? ? ? ? PT Short Term Goals - 02/24/22 1703   ? ?  ? PT SHORT TERM GOAL #1  ? Title Pt will be independent with initial HEP.   ? Time 3   ? Period Weeks   ? Status Achieved   ? Target Date 02/25/22   ?  ? PT SHORT TERM GOAL #2  ? Title Complete Berg and/or DGI to further assess balance deficits   ? Time 2   ? Period Weeks   ? Status Achieved   ? Target Date 02/18/22   ? ?  ?  ? ?  ? ? ? ? PT Long Term Goals - 02/09/22 0929   ? ?  ? PT LONG TERM GOAL #1  ? Title Pt will be independent with advanced HEP.   ? Time 8   ? Period Weeks   ? Status On-going   ? Target Date 04/01/22   ?  ? PT LONG TERM GOAL #2  ? Title Pt. will demonstrate improved LLE strength to 4+/5 throughout to decrease risk of falls.   ? Baseline 3/5-3+/5 LLE strength, increased pain   ? Time 8   ? Period Weeks   ? Status On-going   ? Target Date 04/01/22   ?  ? PT LONG TERM GOAL #3  ? Title Pt. will demonstrate improved endurance by completing 1100 ft in 6 minutes without increased pain or DOE.   ? Baseline 878 ft,well below average for age/sex, increased L hip pain   ? Time 8   ? Period Weeks   ? Status On-going   ? Target Date 04/01/22   ?  ? PT LONG TERM GOAL #4  ? Title Pt. will demonstrate improved functional strength by completing 5xSTS in <15 seconds.   ? Baseline 20 sec, increased L knee pain   ? Time 8   ? Period Weeks   ? Status On-going   ? Target Date 04/01/22   ?  ? PT LONG TERM GOAL #5  ?  Title Patient to report 75% improvement in pain.   ? Baseline reports back, L hip, L knee > R knee pain with activity   ? Time 8   ? Period Weeks   ? Status On-going   ? Target  Date 04/01/22   ?  ? PT LONG TERM GOAL #6  ? Title Patient will demonstrate decreased fall risk with goal TBD by Berg/DGI   ? Baseline FGA - 24/30, Berg 53/56.  Not at increased risk of falls by these measures.   ? Time 8   ? Period Weeks   ? Status Achieved   ? Target Date 04/01/22   ? ?  ?  ? ?  ? ? ? ? ? ? ? ? Plan - 03/11/22 1643   ? ? Clinical Impression Statement Patient reported fall this morning walking upstairs while distracted.  He reported twisting his back, so initially focused on gentle stretches to decrease tightness, before progressing to balance exercises.  He demonstrated some instability with stepping back initially with star excusion pattern, but improved with practice.  At end of session observed transitioning to SLS and kicking without LOB (he was not instructed to do so by therapist).  Overall demonstrating improving balance and tolerance to exercise.  Declined modalities at end of session, as back pain had decreased with activity.   ? PT Frequency 2x / week   ? PT Duration 8 weeks   ? PT Treatment/Interventions ADLs/Self Care Home Management;Cryotherapy;Moist Heat;Balance training;Therapeutic exercise;Therapeutic activities;Functional mobility training;Stair training;Gait training;Neuromuscular re-education;Patient/family education;Manual techniques;Taping;Energy conservation;Dry needling;Passive range of motion;Joint Manipulations;Spinal Manipulations;Aquatic Therapy   ? PT Next Visit Plan HEP for LE strengthening   ? Consulted and Agree with Plan of Care Patient   ? ?  ?  ? ?  ? ? ?Patient will benefit from skilled therapeutic intervention in order to improve the following deficits and impairments:  Hypomobility, Decreased activity tolerance, Decreased strength, Increased fascial restricitons, Pain, Difficulty walking, Increased muscle spasms, Cardiopulmonary status limiting activity, Improper body mechanics, Decreased range of motion, Impaired flexibility, Postural dysfunction, Abnormal  gait, Decreased balance, Decreased mobility, Decreased scar mobility, Decreased endurance ? ?Visit Diagnosis: ?Unsteadiness on feet ? ?Other abnormalities of gait and mobility ? ?Chronic left-sided low back pa

## 2022-03-12 ENCOUNTER — Ambulatory Visit (INDEPENDENT_AMBULATORY_CARE_PROVIDER_SITE_OTHER): Payer: Medicare Other

## 2022-03-12 DIAGNOSIS — I480 Paroxysmal atrial fibrillation: Secondary | ICD-10-CM | POA: Diagnosis not present

## 2022-03-12 DIAGNOSIS — Z20822 Contact with and (suspected) exposure to covid-19: Secondary | ICD-10-CM | POA: Diagnosis not present

## 2022-03-13 LAB — CUP PACEART REMOTE DEVICE CHECK
Battery Remaining Longevity: 133 mo
Battery Voltage: 3.01 V
Brady Statistic AP VP Percent: 19.49 %
Brady Statistic AP VS Percent: 52.53 %
Brady Statistic AS VP Percent: 0.53 %
Brady Statistic AS VS Percent: 27.45 %
Brady Statistic RA Percent Paced: 68.91 %
Brady Statistic RV Percent Paced: 21.85 %
Date Time Interrogation Session: 20230405223552
Implantable Lead Implant Date: 20210106
Implantable Lead Implant Date: 20210106
Implantable Lead Location: 753859
Implantable Lead Location: 753860
Implantable Lead Model: 3830
Implantable Lead Model: 5076
Implantable Pulse Generator Implant Date: 20210106
Lead Channel Impedance Value: 304 Ohm
Lead Channel Impedance Value: 361 Ohm
Lead Channel Impedance Value: 380 Ohm
Lead Channel Impedance Value: 532 Ohm
Lead Channel Pacing Threshold Amplitude: 0.625 V
Lead Channel Pacing Threshold Amplitude: 0.625 V
Lead Channel Pacing Threshold Pulse Width: 0.4 ms
Lead Channel Pacing Threshold Pulse Width: 0.4 ms
Lead Channel Sensing Intrinsic Amplitude: 2.75 mV
Lead Channel Sensing Intrinsic Amplitude: 2.75 mV
Lead Channel Sensing Intrinsic Amplitude: 31.125 mV
Lead Channel Sensing Intrinsic Amplitude: 31.125 mV
Lead Channel Setting Pacing Amplitude: 1.5 V
Lead Channel Setting Pacing Amplitude: 2.5 V
Lead Channel Setting Pacing Pulse Width: 0.4 ms
Lead Channel Setting Sensing Sensitivity: 2 mV

## 2022-03-16 ENCOUNTER — Encounter: Payer: Self-pay | Admitting: Physical Therapy

## 2022-03-16 ENCOUNTER — Ambulatory Visit: Payer: Medicare Other | Admitting: Physical Therapy

## 2022-03-16 ENCOUNTER — Other Ambulatory Visit: Payer: Self-pay

## 2022-03-16 DIAGNOSIS — M25652 Stiffness of left hip, not elsewhere classified: Secondary | ICD-10-CM | POA: Diagnosis not present

## 2022-03-16 DIAGNOSIS — G8929 Other chronic pain: Secondary | ICD-10-CM | POA: Diagnosis not present

## 2022-03-16 DIAGNOSIS — R2681 Unsteadiness on feet: Secondary | ICD-10-CM | POA: Diagnosis not present

## 2022-03-16 DIAGNOSIS — M62830 Muscle spasm of back: Secondary | ICD-10-CM

## 2022-03-16 DIAGNOSIS — R2689 Other abnormalities of gait and mobility: Secondary | ICD-10-CM

## 2022-03-16 DIAGNOSIS — M545 Low back pain, unspecified: Secondary | ICD-10-CM | POA: Diagnosis not present

## 2022-03-16 DIAGNOSIS — M25552 Pain in left hip: Secondary | ICD-10-CM | POA: Diagnosis not present

## 2022-03-16 MED ORDER — AMLODIPINE BESYLATE 5 MG PO TABS
5.0000 mg | ORAL_TABLET | Freq: Every day | ORAL | 3 refills | Status: DC
Start: 2022-03-16 — End: 2023-03-09

## 2022-03-16 MED ORDER — LEVOTHYROXINE SODIUM 150 MCG PO TABS
150.0000 ug | ORAL_TABLET | Freq: Every day | ORAL | 0 refills | Status: DC
Start: 2022-03-16 — End: 2022-06-05

## 2022-03-16 MED ORDER — FERROUS FUMARATE 325 (106 FE) MG PO TABS: 1.0000 | ORAL_TABLET | Freq: Two times a day (BID) | ORAL | 1 refills | Status: DC

## 2022-03-16 NOTE — Therapy (Signed)
[place] ?[facility] ?[address] ?[place], [place], [address] ?Phone: [phone]   Fax:  [phone] ? ?Physical Therapy Treatment/Progress Note ? ?Progress Note ?Reporting Period 02/04/2022 to 03/16/2022 ? ?See note below for Objective Data and Assessment of Progress/Goals.  ? ? ? ?Patient Details  ?Name: Martin Rubio ?MRN: [id number] ?Date of Birth: 10-26-1946 ?Referring Provider (PT): [place], [place] E. ? ? ?Encounter Date: 03/16/2022 ? ? PT End of Session - 03/16/22 0802   ? ? Visit Number 9   ? Number of Visits 16   ? Date for PT Re-Evaluation 04/01/22   ? Authorization Type Medicare & [employer] Life   ? PT Start Time 0802   ? PT Stop Time 0845   ? PT Time Calculation (min) 43 min   ? Activity Tolerance Patient tolerated treatment well   ? Behavior During Therapy [facility] for tasks assessed/performed   ? ?  ?  ? ?  ? ? ?Past Medical History:  ?Diagnosis Date  ? Bronchitis   ? Diverticulosis   ? FH: BPH (benign prostatic hypertrophy)   ? History of Graves' disease 01/19/2018  ? S/p ablation  ? HTN (hypertension)   ? Hyperlipidemia   ? Obesity   ? Psoriatic arthritis ([place])   ? Rheumatoid arthritis ([place]) 08/07/2014  ? Sleep apnea   ? CPAP  ? Thyroid disease   ? ? ?Past Surgical History:  ?Procedure Laterality Date  ? ABDOMINAL AORTOGRAM W/LOWER EXTREMITY N/A 01/14/2022  ? Procedure: ABDOMINAL AORTOGRAM W/LOWER EXTREMITY;  Surgeon: Wellington Hampshire, MD;  Location: [facility];  Service: Cardiovascular;  Laterality: N/A;  ? INGUINAL HERNIA REPAIR    ? bilateral  ? KNEE SURGERY    ? bilateral arthroscopic  ? l foot surgery Left 12/2020  ? PACEMAKER IMPLANT N/A 12/13/2019  ? Procedure: PACEMAKER IMPLANT;  Surgeon: Evans Lance, MD;  Location: [facility];  Service: Cardiovascular;  Laterality: N/A;  ? TRANSURETHRAL RESECTION OF PROSTATE    ? UMBILICAL HERNIA REPAIR    ? ? ?There were no vitals filed for this visit. ? ? Subjective Assessment - 03/16/22 0805   ? ?  Subjective Patient thinks he twisted his back again over the weekend. Salonpas helped.   ? Pertinent History Pace maker 12/13/19, psoriatic arthritis, osteoporosis, Afib (anticoagulated), HOH, sick sinus syndrome, high cholesterol, hypothyroidism, OSA on CPAP, graves disease, bil arthroscopic knee surgery, left ventricular hypertropy, iliac aneurysm, neuropathy, CKD, osteoarthritis in bil knees   ? How long can you sit comfortably? unlimited   ? How long can you stand comfortably? <20 minutes   ? How long can you walk comfortably? can't walk long due to hip and back pain, also SOB   ? Diagnostic tests 04/25/20 lumbar xray: Mild degenerative disc disease is noted at L3-4. No acuteabnormality seen in the lumbar spine.; 04/25/20 thoracic xray: No acute abnormality seen in the thoracic spine. Multilevel degenerative disc disease is noted.   ? Patient Stated Goals build stamina   ? Currently in Pain? Yes   ? Pain Score 3    ? Pain Location Back   ? Pain Orientation Lower   ? Pain Descriptors / Indicators Nagging   ? ?  ?  ? ?  ? ? ? ? ? [facility] PT Assessment - 03/16/22 0001   ? ?  ? Assessment  ? Medical Diagnosis R26.9 (ICD-10-CM) - Gait disorder   ? Referring Provider (PT) Larose Kells, [place]  E.   ?  ? Precautions  ? Precautions ICD/Pacemaker   ?  ? Restrictions  ? Weight Bearing Restrictions No   ?  ? Strength  ? Overall Strength Within functional limits for tasks performed   ? Overall Strength Comments 4+/5 bil LE strength, tested in sitting   ? Right/Left Hip Right;Left   ? Right Hip Flexion 4+/5   ? Right Hip Extension 4+/5   ? Right Hip ABduction 4+/5   ? Right Hip ADduction 4+/5   ? Left Hip Flexion 4+/5   ? Left Hip ABduction 4+/5   ? Left Hip ADduction 4+/5   ? Right/Left Knee Right;Left   ? Right Knee Flexion 4+/5   ? Right Knee Extension 5/5   ? Left Knee Flexion 4+/5   ? Left Knee Extension 5/5   ? Right/Left Ankle Right;Left   ? Right Ankle Dorsiflexion 4+/5   ? Left Ankle Dorsiflexion 4+/5   ? ?  ?  ? ?   ? ? ? ? ? ? ? ? ? ? ? ? ? ? ? ? [place] Adult PT Treatment/Exercise - 03/16/22 0001   ? ?  ? Ambulation/Gait  ? Ambulation/Gait Yes   ? Ambulation/Gait Assistance 7: Independent   ? Ambulation Distance (Feet) 790 Feet   ? Assistive device None   ? Gait Pattern Within Functional Limits   however reports feeling unsteady throughout  ? Stairs Yes   ? Stairs Assistance 6: Modified independent (Device/Increase time)   ? Stair Management Technique One rail Right;Alternating pattern   ? Number of Stairs 14   ? Height of Stairs 8   ? Gait Comments poor eccentric control on L descending, reporting increased L knee pain.   ?  ? Exercises  ? Exercises Lumbar   ?  ? Lumbar Exercises: Aerobic  ? Nustep L5 x 6 min   ?  ? Lumbar Exercises: Seated  ? Sit to Stand 20 reps   ? Sit to Stand Limitations with weighted yellow ball reaching foward and then overhead to decrease UE support and challenge balance.   ? ?  ?  ? ?  ? ? ? ? ? ? Balance Exercises - 03/16/22 0001   ? ?  ? Balance Exercises: Standing  ? Standing Eyes Opened Foam/compliant surface;Other reps (comment);Limitations   ? Standing Eyes Opened Limitations diagonal reachines with yellow weighted ball   ? Wall Bumps Hip;Eyes closed;20 reps   ? Other Standing Exercises star excursion pattern - starting 180 deg arc and cross over, progressing to random color and leg taps.  CGA throughout for safety.   ? ?  ?  ? ?  ? ? ? ? ? ? ? PT Short Term Goals - 02/24/22 1703   ? ?  ? PT SHORT TERM GOAL #1  ? Title Pt will be independent with initial HEP.   ? Time 3   ? Period Weeks   ? Status Achieved   ? Target Date 02/25/22   ?  ? PT SHORT TERM GOAL #2  ? Title Complete Berg and/or DGI to further assess balance deficits   ? Time 2   ? Period Weeks   ? Status Achieved   ? Target Date 02/18/22   ? ?  ?  ? ?  ? ? ? ? PT Long Term Goals - 03/16/22 0830   ? ?  ? PT LONG TERM GOAL #1  ? Title Pt will be independent with advanced HEP.   ?  Time 8   ? Period Weeks   ? Status On-going   ? Target  Date 04/01/22   ?  ? PT LONG TERM GOAL #2  ? Title Pt. will demonstrate improved LLE strength to 4+/5 throughout to decrease risk of falls.   ? Baseline 3/5-3+/5 LLE strength, increased pain   ? Time 8   ? Period Weeks   ? Status Achieved   ? Target Date 04/01/22   ?  ? PT LONG TERM GOAL #3  ? Title Pt. will demonstrate improved endurance by completing 1100 ft in 6 minutes without increased pain or DOE.   ? Baseline 878 ft,well below average for age/sex, increased L hip pain   ? Time 8   ? Period Weeks   ? Status On-going   03/16/22- at end of session so fatigued, 800' but no increase in LE pain. Unsteady feeling.  ? Target Date 04/01/22   ?  ? PT LONG TERM GOAL #4  ? Title Pt. will demonstrate improved functional strength by completing 5xSTS in <15 seconds.   ? Baseline 20 sec, increased L knee pain   ? Time 8   ? Period Weeks   ? Status Achieved   03/16/22- 13 seconds without UE assist, increased L knee pain  ? Target Date 04/01/22   ?  ? PT LONG TERM GOAL #5  ? Title Patient to report 75% improvement in pain.   ? Baseline reports back, L hip, L knee > R knee pain with activity   ? Time 8   ? Period Weeks   ? Status On-going   ? Target Date 04/01/22   ?  ? PT LONG TERM GOAL #6  ? Title Patient will demonstrate decreased fall risk with goal TBD by Berg/DGI   ? Baseline FGA - 24/30, Berg 53/56.  Not at increased risk of falls by these measures.   ? Time 8   ? Period Weeks   ? Status Achieved   ? Target Date 04/01/22   ? ?  ?  ? ?  ? ? ? ? ? ? ? ? Plan - 03/16/22 1358   ? ? Clinical Impression Statement Mr. Petillo is making good progress, demonstrating improving LE strength and balance, meeting LTG #2 and #4 today.  He is still having trouble with back pain, and L knee pain.  Discussed today again return to ortho for more interventions for L knee pain, since he reports severe OA and is not interested in knee replacement, and reminded to check out [employer] again for water aerobics.  He reports that they have decided to hold  surgery for decreased blood flow in LLE, and did not report increased pain with activity today.  He still feels unsteady with gait, and noted frequent LOB with retro stepping again today,  but is able to easily recove

## 2022-03-17 DIAGNOSIS — Z23 Encounter for immunization: Secondary | ICD-10-CM | POA: Diagnosis not present

## 2022-03-17 DIAGNOSIS — Z20822 Contact with and (suspected) exposure to covid-19: Secondary | ICD-10-CM | POA: Diagnosis not present

## 2022-03-19 ENCOUNTER — Encounter: Payer: Self-pay | Admitting: Cardiology

## 2022-03-20 DIAGNOSIS — Z20822 Contact with and (suspected) exposure to covid-19: Secondary | ICD-10-CM | POA: Diagnosis not present

## 2022-03-21 DIAGNOSIS — Z20822 Contact with and (suspected) exposure to covid-19: Secondary | ICD-10-CM | POA: Diagnosis not present

## 2022-03-23 ENCOUNTER — Ambulatory Visit: Payer: Medicare Other

## 2022-03-23 DIAGNOSIS — R2689 Other abnormalities of gait and mobility: Secondary | ICD-10-CM | POA: Diagnosis not present

## 2022-03-23 DIAGNOSIS — R2681 Unsteadiness on feet: Secondary | ICD-10-CM

## 2022-03-23 DIAGNOSIS — M25652 Stiffness of left hip, not elsewhere classified: Secondary | ICD-10-CM | POA: Diagnosis not present

## 2022-03-23 DIAGNOSIS — M62830 Muscle spasm of back: Secondary | ICD-10-CM

## 2022-03-23 DIAGNOSIS — M545 Low back pain, unspecified: Secondary | ICD-10-CM | POA: Diagnosis not present

## 2022-03-23 DIAGNOSIS — G8929 Other chronic pain: Secondary | ICD-10-CM

## 2022-03-23 DIAGNOSIS — M25552 Pain in left hip: Secondary | ICD-10-CM | POA: Diagnosis not present

## 2022-03-23 NOTE — Therapy (Signed)
[place] ?[facility] ?[address] ?[place], [place], [address] ?Phone: [phone]   Fax:  [phone] ? ?Physical Therapy Treatment ? ?Patient Details  ?Name: Martin Rubio ?MRN: [id number] ?Date of Birth: [birth year] ?Referring Provider (PT): [place], [place] E. ? ? ?Encounter Date: 03/23/2022 ? ? PT End of Session - 03/23/22 1448   ? ? Visit Number 10   ? Number of Visits 16   ? Date for PT Re-Evaluation 04/01/22   ? Authorization Type Medicare & [employer] Life   ? PT Start Time 2683   ? PT Stop Time 4196   ? PT Time Calculation (min) 40 min   ? Activity Tolerance Patient tolerated treatment well   ? Behavior During Therapy [facility] for tasks assessed/performed   ? ?  ?  ? ?  ? ? ?Past Medical History:  ?Diagnosis Date  ? Bronchitis   ? Diverticulosis   ? FH: BPH (benign prostatic hypertrophy)   ? History of Graves' disease 01/19/2018  ? S/p ablation  ? HTN (hypertension)   ? Hyperlipidemia   ? Obesity   ? Psoriatic arthritis ([place])   ? Rheumatoid arthritis ([place]) 08/07/2014  ? Sleep apnea   ? CPAP  ? Thyroid disease   ? ? ?Past Surgical History:  ?Procedure Laterality Date  ? ABDOMINAL AORTOGRAM W/LOWER EXTREMITY N/A 01/14/2022  ? Procedure: ABDOMINAL AORTOGRAM W/LOWER EXTREMITY;  Surgeon: Wellington Hampshire, MD;  Location: [facility];  Service: Cardiovascular;  Laterality: N/A;  ? INGUINAL HERNIA REPAIR    ? bilateral  ? KNEE SURGERY    ? bilateral arthroscopic  ? l foot surgery Left 12/2020  ? PACEMAKER IMPLANT N/A 12/13/2019  ? Procedure: PACEMAKER IMPLANT;  Surgeon: Evans Lance, MD;  Location: [facility];  Service: Cardiovascular;  Laterality: N/A;  ? TRANSURETHRAL RESECTION OF PROSTATE    ? UMBILICAL HERNIA REPAIR    ? ? ?There were no vitals filed for this visit. ? ? Subjective Assessment - 03/23/22 1405   ? ? Subjective Pt reports sleeping wrong last night, could barely get out of bed now feeling better.   ? Pertinent History Pace maker 12/13/19, psoriatic  arthritis, osteoporosis, Afib (anticoagulated), HOH, sick sinus syndrome, high cholesterol, hypothyroidism, OSA on CPAP, graves disease, bil arthroscopic knee surgery, left ventricular hypertropy, iliac aneurysm, neuropathy, CKD, osteoarthritis in bil knees   ? Diagnostic tests 04/25/20 lumbar xray: Mild degenerative disc disease is noted at L3-4. No acuteabnormality seen in the lumbar spine.; 04/25/20 thoracic xray: No acute abnormality seen in the thoracic spine. Multilevel degenerative disc disease is noted.   ? Patient Stated Goals build stamina   ? Currently in Pain? Yes   ? Pain Score 4    ? Pain Location Back   ? Pain Orientation Lower   ? Pain Descriptors / Indicators Nagging   ? Pain Type Chronic pain   ? ?  ?  ? ?  ? ? ? ? ? ? ? ? ? ? ? ? ? ? ? ? ? ? ? ? [place] Adult PT Treatment/Exercise - 03/23/22 0001   ? ?  ? Lumbar Exercises: Stretches  ? Standing Extension 10 reps   3 sec hold  ? Standing Extension Limitations arms supported on wall   ?  ? Lumbar Exercises: Standing  ? Other Standing Lumbar Exercises horizontal ABD x 10 GTB; feet together   ? Other Standing Lumbar Exercises alt diagnols with GTB x  10   ?  ? Manual Therapy  ? Manual Therapy Soft tissue mobilization   ? Soft tissue mobilization IASTM with foam roll to B lumbar paraspinals, QL   ? ?  ?  ? ?  ? ? ? ? ? ? ? ? ? ? ? ? PT Short Term Goals - 02/24/22 1703   ? ?  ? PT SHORT TERM GOAL #1  ? Title Pt will be independent with initial HEP.   ? Time 3   ? Period Weeks   ? Status Achieved   ? Target Date 02/25/22   ?  ? PT SHORT TERM GOAL #2  ? Title Complete Berg and/or DGI to further assess balance deficits   ? Time 2   ? Period Weeks   ? Status Achieved   ? Target Date 02/18/22   ? ?  ?  ? ?  ? ? ? ? PT Long Term Goals - 03/16/22 0830   ? ?  ? PT LONG TERM GOAL #1  ? Title Pt will be independent with advanced HEP.   ? Time 8   ? Period Weeks   ? Status On-going   ? Target Date 04/01/22   ?  ? PT LONG TERM GOAL #2  ? Title Pt. will demonstrate  improved LLE strength to 4+/5 throughout to decrease risk of falls.   ? Baseline 3/5-3+/5 LLE strength, increased pain   ? Time 8   ? Period Weeks   ? Status Achieved   ? Target Date 04/01/22   ?  ? PT LONG TERM GOAL #3  ? Title Pt. will demonstrate improved endurance by completing 1100 ft in 6 minutes without increased pain or DOE.   ? Baseline 878 ft,well below average for age/sex, increased L hip pain   ? Time 8   ? Period Weeks   ? Status On-going   03/16/22- at end of session so fatigued, 800' but no increase in LE pain. Unsteady feeling.  ? Target Date 04/01/22   ?  ? PT LONG TERM GOAL #4  ? Title Pt. will demonstrate improved functional strength by completing 5xSTS in <15 seconds.   ? Baseline 20 sec, increased L knee pain   ? Time 8   ? Period Weeks   ? Status Achieved   03/16/22- 13 seconds without UE assist, increased L knee pain  ? Target Date 04/01/22   ?  ? PT LONG TERM GOAL #5  ? Title Patient to report 75% improvement in pain.   ? Baseline reports back, L hip, L knee > R knee pain with activity   ? Time 8   ? Period Weeks   ? Status On-going   ? Target Date 04/01/22   ?  ? PT LONG TERM GOAL #6  ? Title Patient will demonstrate decreased fall risk with goal TBD by Berg/DGI   ? Baseline FGA - 24/30, Berg 53/56.  Not at increased risk of falls by these measures.   ? Time 8   ? Period Weeks   ? Status Achieved   ? Target Date 04/01/22   ? ?  ?  ? ?  ? ? ? ? ? ? ? ? Plan - 03/23/22 1449   ? ? Clinical Impression Statement Pt had increased pain today, focused session on STM and low load exercises to improve LBP. After MT pt noted much improvement in pain and ambulation. Noted some R shoulder fatigue with standing strengthening exercises but  no reports of pain. Mod cues during session to avoid pushing to pain.   ? Personal Factors and Comorbidities Age;Comorbidity 3+;Time since onset of injury/illness/exacerbation   ? Comorbidities Pacemaker, psoriatic arthritis, osteoporosis, Afib (anticoagulated), HOH, sick  sinus syndrome, high cholesterol, hypothyroidism, OSA on CPAP, graves disease, bil arthroscopic knee surgery, left ventricular hypertropy, iliac aneurysm, neuropathy, CKD   ? PT Frequency 2x / week   ? PT Duration 8 weeks   ? PT Treatment/Interventions ADLs/Self Care Home Management;Cryotherapy;Moist Heat;Balance training;Therapeutic exercise;Therapeutic activities;Functional mobility training;Stair training;Gait training;Neuromuscular re-education;Patient/family education;Manual techniques;Taping;Energy conservation;Dry needling;Passive range of motion;Joint Manipulations;Spinal Manipulations;Aquatic Therapy   ? PT Next Visit Plan review HEP for LE strengthening;   ? Consulted and Agree with Plan of Care Patient   ? ?  ?  ? ?  ? ? ?Patient will benefit from skilled therapeutic intervention in order to improve the following deficits and impairments:  Hypomobility, Decreased activity tolerance, Decreased strength, Increased fascial restricitons, Pain, Difficulty walking, Increased muscle spasms, Cardiopulmonary status limiting activity, Improper body mechanics, Decreased range of motion, Impaired flexibility, Postural dysfunction, Abnormal gait, Decreased balance, Decreased mobility, Decreased scar mobility, Decreased endurance ? ?Visit Diagnosis: ?Unsteadiness on feet ? ?Other abnormalities of gait and mobility ? ?Chronic left-sided low back pain without sciatica ? ?Pain in left hip ? ?Stiffness of left hip, not elsewhere classified ? ?Muscle spasm of back ? ? ? ? ?Problem List ?Patient Active Problem List  ? Diagnosis Date Noted  ? Aortic atherosclerosis ([place]) 03/18/2021  ? History of COVID-19 02/10/2021  ? Bruit 02/09/2021  ? SOB (shortness of breath) 02/09/2021  ? Aortic valve sclerosis 02/09/2021  ? Secondary hypercoagulable state ([place]) 03/05/2020  ? Educated about COVID-19 virus infection 01/27/2020  ? PCP notes >>>>>>>>>>>>>>> 01/10/2020  ? Age related osteoporosis 01/10/2020  ? CKD (chronic kidney disease),  stage IV ([place]) 12/16/2019  ? Pacemaker 12/16/2019  ? Sick sinus syndrome ([place]) 12/16/2019  ? Chest pain 12/15/2019  ? Paroxysmal atrial fibrillation ([place]) 10/26/2019  ? Dyslipidemia 09/14/2019  ? Nodule o

## 2022-03-24 ENCOUNTER — Ambulatory Visit: Payer: Medicare Other | Admitting: Cardiovascular Disease

## 2022-03-25 ENCOUNTER — Ambulatory Visit: Payer: Medicare Other

## 2022-03-25 DIAGNOSIS — D509 Iron deficiency anemia, unspecified: Secondary | ICD-10-CM | POA: Diagnosis not present

## 2022-03-26 ENCOUNTER — Ambulatory Visit: Payer: Medicare Other

## 2022-03-26 DIAGNOSIS — G8929 Other chronic pain: Secondary | ICD-10-CM

## 2022-03-26 DIAGNOSIS — M25552 Pain in left hip: Secondary | ICD-10-CM

## 2022-03-26 DIAGNOSIS — R2681 Unsteadiness on feet: Secondary | ICD-10-CM

## 2022-03-26 DIAGNOSIS — R2689 Other abnormalities of gait and mobility: Secondary | ICD-10-CM | POA: Diagnosis not present

## 2022-03-26 DIAGNOSIS — M545 Low back pain, unspecified: Secondary | ICD-10-CM | POA: Diagnosis not present

## 2022-03-26 DIAGNOSIS — M25652 Stiffness of left hip, not elsewhere classified: Secondary | ICD-10-CM | POA: Diagnosis not present

## 2022-03-26 DIAGNOSIS — M62830 Muscle spasm of back: Secondary | ICD-10-CM

## 2022-03-26 DIAGNOSIS — Z20822 Contact with and (suspected) exposure to covid-19: Secondary | ICD-10-CM | POA: Diagnosis not present

## 2022-03-26 NOTE — Patient Instructions (Signed)
Access Code: [email or web address] ?URL: [email or web address] ?Date: 03/26/2022 ?Prepared by: Clarene Essex ? ?Exercises ?- Heel Raises with Counter Support  - 1 x daily - 7 x weekly - 2-3 sets - 10 reps ?- Heel Toe Raises with Counter Support  - 1 x daily - 7 x weekly - 3 sets - 10 reps ?- Standing Hip Abduction with Counter Support  - 1 x daily - 7 x weekly - 2-3 sets - 10 reps ?- Standing Hip Extension with Counter Support  - 1 x daily - 7 x weekly - 2-3 sets - 10 reps ?- Standing Hip Flexion with Counter Support  - 1 x daily - 7 x weekly - 2-3 sets - 10 reps ?- Standing Single Leg Stance with Counter Support  - 1 x daily - 7 x weekly - 2-3 sets - 10 reps ?- Standing Balance in Corner  - 3 x daily - 7 x weekly - 1 sets ?- Tandem Walking with Counter Support  - 1 x daily - 7 x weekly - 3 sets - 10 reps ?

## 2022-03-26 NOTE — Therapy (Signed)
[facility] ?[place] ?[place], [place], [address] ?Phone: [phone]   Fax:  [phone] ? ?Physical Therapy Treatment ? ?Patient Details  ?Name: Martin Rubio ?MRN: [id number] ?Date of Birth: [birth year] ?Referring Provider (PT): [place], [place] E. ? ? ?Encounter Date: 03/26/2022 ? ? PT End of Session - 03/26/22 1753   ? ? Visit Number 11   ? Number of Visits 16   ? Date for PT Re-Evaluation 04/01/22   ? Authorization Type Medicare & US[employer] Life   ? PT Start Time 1702   ? PT Stop Time 1740   ? PT Time Calculation (min) 38 min   ? Activity Tolerance Patient tolerated treatment well   ? Behavior During Therapy [facility] for tasks assessed/performed   ? ?  ?  ? ?  ? ? ?Past Medical History:  ?Diagnosis Date  ? Bronchitis   ? Diverticulosis   ? FH: BPH (benign prostatic hypertrophy)   ? History of Graves' disease 01/19/2018  ? S/p ablation  ? HTN (hypertension)   ? Hyperlipidemia   ? Obesity   ? Psoriatic arthritis ([place])   ? Rheumatoid arthritis ([place]) 08/07/2014  ? Sleep apnea   ? CPAP  ? Thyroid disease   ? ? ?Past Surgical History:  ?Procedure Laterality Date  ? ABDOMINAL AORTOGRAM W/LOWER EXTREMITY N/A 01/14/2022  ? Procedure: ABDOMINAL AORTOGRAM W/LOWER EXTREMITY;  Surgeon: Wellington Hampshire, MD;  Location: [facility];  Service: Cardiovascular;  Laterality: N/A;  ? INGUINAL HERNIA REPAIR    ? bilateral  ? KNEE SURGERY    ? bilateral arthroscopic  ? l foot surgery Left 12/2020  ? PACEMAKER IMPLANT N/A 12/13/2019  ? Procedure: PACEMAKER IMPLANT;  Surgeon: Evans Lance, MD;  Location: [facility];  Service: Cardiovascular;  Laterality: N/A;  ? TRANSURETHRAL RESECTION OF PROSTATE    ? UMBILICAL HERNIA REPAIR    ? ? ?There were no vitals filed for this visit. ? ? Subjective Assessment - 03/26/22 1705   ? ? Subjective Having a little knee pain today.   ? Pertinent History Pace maker 12/13/19, psoriatic arthritis, osteoporosis, Afib (anticoagulated), HOH,  sick sinus syndrome, high cholesterol, hypothyroidism, OSA on CPAP, graves disease, bil arthroscopic knee surgery, left ventricular hypertropy, iliac aneurysm, neuropathy, CKD, osteoarthritis in bil knees   ? Diagnostic tests 04/25/20 lumbar xray: Mild degenerative disc disease is noted at L3-4. No acuteabnormality seen in the lumbar spine.; 04/25/20 thoracic xray: No acute abnormality seen in the thoracic spine. Multilevel degenerative disc disease is noted.   ? Patient Stated Goals build stamina   ? Currently in Pain? Yes   ? Pain Score 3    ? Pain Location Knee   ? Pain Orientation Left   ? ?  ?  ? ?  ? ? ? ? ? ? ? ? ? ? ? ? ? ? ? ? ? ? ? ? [place] Adult PT Treatment/Exercise - 03/26/22 0001   ? ?  ? Exercises  ? Exercises Lumbar   ?  ? Lumbar Exercises: Aerobic  ? Elliptical L2x53mn   ? Recumbent Bike L4x639m   ?  ? Lumbar Exercises: Machines for Strengthening  ? Other Lumbar Machine Exercise cybex row 25lb x 20 reps   ? Other Lumbar Machine Exercise lat pulls 25lb x20 reps   ?  ? Knee/Hip Exercises: Standing  ? Hip Flexion Stengthening;Both;10 reps;Knee straight   ? Hip Flexion Limitations red TB   ?  Hip Abduction Stengthening;Both;Knee straight   ? Abduction Limitations red TB   ? Hip Extension Stengthening;Both;Knee straight   ? Extension Limitations red TB   ? ?  ?  ? ?  ? ? ? ? ? ? Balance Exercises - 03/26/22 0001   ? ?  ? Balance Exercises: Standing  ? SLS --   R/L x 15 sec at counter  ? Tandem Gait Forward;Upper extremity support;3 reps   along the counter  ? ?  ?  ? ?  ? ? ? ? ? PT Education - 03/26/22 1759   ? ? Education Details added tandem gait, progressed standing hip with red TB   ? Person(s) Educated Patient   ? Methods Explanation;Demonstration;Handout;Verbal cues   ? Comprehension Verbalized understanding;Returned demonstration;Need further instruction   ? ?  ?  ? ?  ? ? ? PT Short Term Goals - 02/24/22 1703   ? ?  ? PT SHORT TERM GOAL #1  ? Title Pt will be independent with initial HEP.   ? Time  3   ? Period Weeks   ? Status Achieved   ? Target Date 02/25/22   ?  ? PT SHORT TERM GOAL #2  ? Title Complete Berg and/or DGI to further assess balance deficits   ? Time 2   ? Period Weeks   ? Status Achieved   ? Target Date 02/18/22   ? ?  ?  ? ?  ? ? ? ? PT Long Term Goals - 03/16/22 0830   ? ?  ? PT LONG TERM GOAL #1  ? Title Pt will be independent with advanced HEP.   ? Time 8   ? Period Weeks   ? Status On-going   ? Target Date 04/01/22   ?  ? PT LONG TERM GOAL #2  ? Title Pt. will demonstrate improved LLE strength to 4+/5 throughout to decrease risk of falls.   ? Baseline 3/5-3+/5 LLE strength, increased pain   ? Time 8   ? Period Weeks   ? Status Achieved   ? Target Date 04/01/22   ?  ? PT LONG TERM GOAL #3  ? Title Pt. will demonstrate improved endurance by completing 1100 ft in 6 minutes without increased pain or DOE.   ? Baseline 878 ft,well below average for age/sex, increased L hip pain   ? Time 8   ? Period Weeks   ? Status On-going   03/16/22- at end of session so fatigued, 800' but no increase in LE pain. Unsteady feeling.  ? Target Date 04/01/22   ?  ? PT LONG TERM GOAL #4  ? Title Pt. will demonstrate improved functional strength by completing 5xSTS in <15 seconds.   ? Baseline 20 sec, increased L knee pain   ? Time 8   ? Period Weeks   ? Status Achieved   03/16/22- 13 seconds without UE assist, increased L knee pain  ? Target Date 04/01/22   ?  ? PT LONG TERM GOAL #5  ? Title Patient to report 75% improvement in pain.   ? Baseline reports back, L hip, L knee > R knee pain with activity   ? Time 8   ? Period Weeks   ? Status On-going   ? Target Date 04/01/22   ?  ? PT LONG TERM GOAL #6  ? Title Patient will demonstrate decreased fall risk with goal TBD by Berg/DGI   ? Baseline FGA - 24/30, Berg 53/56.  Not at increased risk of falls by these measures.   ? Time 8   ? Period Weeks   ? Status Achieved   ? Target Date 04/01/22   ? ?  ?  ? ?  ? ? ? ? ? ? ? ? Plan - 03/26/22 1753   ? ? Clinical Impression  Statement Pt at this point feels that he is ready for a transition to HEP, as he notes much improvement in balance and ambulation with stability. He demonstrated difficulty with tandem gait with some LOB both ways. Progressed standing hip strengthening with greater resistance. Provided cuing with standing hip exercises to avoid trunk leaning and to keep knees straight. Reviewed gym equipment to ensure safety with gym regimen he plans to continue after D/C. Pt overall responded well and will be ready for transition to HEP.   ? Personal Factors and Comorbidities Age;Comorbidity 3+;Time since onset of injury/illness/exacerbation   ? Comorbidities Pacemaker, psoriatic arthritis, osteoporosis, Afib (anticoagulated), HOH, sick sinus syndrome, high cholesterol, hypothyroidism, OSA on CPAP, graves disease, bil arthroscopic knee surgery, left ventricular hypertropy, iliac aneurysm, neuropathy, CKD   ? ?  ?  ? ?  ? ? ?Patient will benefit from skilled therapeutic intervention in order to improve the following deficits and impairments:  Hypomobility, Decreased activity tolerance, Decreased strength, Increased fascial restricitons, Pain, Difficulty walking, Increased muscle spasms, Cardiopulmonary status limiting activity, Improper body mechanics, Decreased range of motion, Impaired flexibility, Postural dysfunction, Abnormal gait, Decreased balance, Decreased mobility, Decreased scar mobility, Decreased endurance ? ?Visit Diagnosis: ?Unsteadiness on feet ? ?Other abnormalities of gait and mobility ? ?Chronic left-sided low back pain without sciatica ? ?Pain in left hip ? ?Stiffness of left hip, not elsewhere classified ? ?Muscle spasm of back ? ? ? ? ?Problem List ?Patient Active Problem List  ? Diagnosis Date Noted  ? Aortic atherosclerosis ([place]) 03/18/2021  ? History of COVID-19 02/10/2021  ? Bruit 02/09/2021  ? SOB (shortness of breath) 02/09/2021  ? Aortic valve sclerosis 02/09/2021  ? Secondary hypercoagulable state ([place])  03/05/2020  ? Educated about COVID-19 virus infection 01/27/2020  ? PCP notes >>>>>>>>>>>>>>> 01/10/2020  ? Age related osteoporosis 01/10/2020  ? CKD (chronic kidney disease), stage IV ([place]) 01/09/

## 2022-03-27 NOTE — Progress Notes (Signed)
Remote pacemaker transmission.   

## 2022-03-30 DIAGNOSIS — N184 Chronic kidney disease, stage 4 (severe): Secondary | ICD-10-CM | POA: Diagnosis not present

## 2022-03-30 DIAGNOSIS — R059 Cough, unspecified: Secondary | ICD-10-CM | POA: Diagnosis not present

## 2022-03-30 DIAGNOSIS — N138 Other obstructive and reflux uropathy: Secondary | ICD-10-CM | POA: Diagnosis not present

## 2022-03-30 DIAGNOSIS — N529 Male erectile dysfunction, unspecified: Secondary | ICD-10-CM | POA: Diagnosis not present

## 2022-03-30 DIAGNOSIS — N401 Enlarged prostate with lower urinary tract symptoms: Secondary | ICD-10-CM | POA: Diagnosis not present

## 2022-03-30 DIAGNOSIS — N528 Other male erectile dysfunction: Secondary | ICD-10-CM | POA: Diagnosis not present

## 2022-03-30 DIAGNOSIS — Z20822 Contact with and (suspected) exposure to covid-19: Secondary | ICD-10-CM | POA: Diagnosis not present

## 2022-03-30 DIAGNOSIS — R051 Acute cough: Secondary | ICD-10-CM | POA: Diagnosis not present

## 2022-04-01 ENCOUNTER — Ambulatory Visit: Payer: Medicare Other | Admitting: Physical Therapy

## 2022-04-01 ENCOUNTER — Encounter: Payer: Self-pay | Admitting: Physical Therapy

## 2022-04-01 DIAGNOSIS — R293 Abnormal posture: Secondary | ICD-10-CM

## 2022-04-01 DIAGNOSIS — G8929 Other chronic pain: Secondary | ICD-10-CM

## 2022-04-01 DIAGNOSIS — M62830 Muscle spasm of back: Secondary | ICD-10-CM

## 2022-04-01 DIAGNOSIS — R2689 Other abnormalities of gait and mobility: Secondary | ICD-10-CM | POA: Diagnosis not present

## 2022-04-01 DIAGNOSIS — M25552 Pain in left hip: Secondary | ICD-10-CM

## 2022-04-01 DIAGNOSIS — M6281 Muscle weakness (generalized): Secondary | ICD-10-CM

## 2022-04-01 DIAGNOSIS — R2681 Unsteadiness on feet: Secondary | ICD-10-CM

## 2022-04-01 DIAGNOSIS — M545 Low back pain, unspecified: Secondary | ICD-10-CM | POA: Diagnosis not present

## 2022-04-01 DIAGNOSIS — M25652 Stiffness of left hip, not elsewhere classified: Secondary | ICD-10-CM | POA: Diagnosis not present

## 2022-04-01 NOTE — Therapy (Signed)
[facility] ?[place] ?[place], [place], [address] ?Phone: [phone]   Fax:  [phone] ? ?Physical Therapy Treatment/Discharge ? ?PHYSICAL THERAPY DISCHARGE SUMMARY ? ?Visits from Start of Care: 12 ? ?Current functional level related to goals / functional outcomes: ?Improved strength, activity tolerance, and steadiness.  FOTO 62%.  ?  ?Remaining deficits: ?Intermittent LE pain ?  ?Education / Equipment: ?HEP  ?Plan: ?Patient agrees to discharge.  Patient is being discharged due to meeting the stated rehab goals.    ? ?  ?Patient Details  ?Name: Martin Rubio ?MRN: [id number] ?Date of Birth: March 19, 1946 ?Referring Provider (PT): [place], [place] E. ? ? ?Encounter Date: 04/01/2022 ? ? PT End of Session - 04/01/22 0935   ? ? Visit Number 12   ? Number of Visits 16   ? Date for PT Re-Evaluation 04/01/22   ? Authorization Type Medicare & USAA Life   ? PT Start Time [phone]   ? PT Stop Time 1016   ? PT Time Calculation (min) 42 min   ? Activity Tolerance Patient tolerated treatment well   ? Behavior During Therapy [facility] for tasks assessed/performed   ? ?  ?  ? ?  ? ? ?Past Medical History:  ?Diagnosis Date  ? Bronchitis   ? Diverticulosis   ? FH: BPH (benign prostatic hypertrophy)   ? History of Graves' disease 01/19/2018  ? S/p ablation  ? HTN (hypertension)   ? Hyperlipidemia   ? Obesity   ? Psoriatic arthritis ([place])   ? Rheumatoid arthritis ([place]) 08/07/2014  ? Sleep apnea   ? CPAP  ? Thyroid disease   ? ? ?Past Surgical History:  ?Procedure Laterality Date  ? ABDOMINAL AORTOGRAM W/LOWER EXTREMITY N/A 01/14/2022  ? Procedure: ABDOMINAL AORTOGRAM W/LOWER EXTREMITY;  Surgeon: Wellington Hampshire, MD;  Location: [facility];  Service: Cardiovascular;  Laterality: N/A;  ? INGUINAL HERNIA REPAIR    ? bilateral  ? KNEE SURGERY    ? bilateral arthroscopic  ? l foot surgery Left 12/2020  ? PACEMAKER IMPLANT N/A 12/13/2019  ? Procedure: PACEMAKER IMPLANT;  Surgeon:  Evans Lance, MD;  Location: [facility];  Service: Cardiovascular;  Laterality: N/A;  ? TRANSURETHRAL RESECTION OF PROSTATE    ? UMBILICAL HERNIA REPAIR    ? ? ?There were no vitals filed for this visit. ? ? Subjective Assessment - 04/01/22 0936   ? ? Subjective Stomach is hurting this morning.  Have some more serious problems need to work with, some kidney problems.  [facility] to the [facility].   ? Pertinent History Pace maker 12/13/19, psoriatic arthritis, osteoporosis, Afib (anticoagulated), HOH, sick sinus syndrome, high cholesterol, hypothyroidism, OSA on CPAP, graves disease, bil arthroscopic knee surgery, left ventricular hypertropy, iliac aneurysm, neuropathy, CKD, osteoarthritis in bil knees   ? Diagnostic tests 04/25/20 lumbar xray: Mild degenerative disc disease is noted at L3-4. No acuteabnormality seen in the lumbar spine.; 04/25/20 thoracic xray: No acute abnormality seen in the thoracic spine. Multilevel degenerative disc disease is noted.   ? Patient Stated Goals build stamina   ? Currently in Pain? No/denies   ? ?  ?  ? ?  ? ? ? ? ? [facility] PT Assessment - 04/01/22 0001   ? ?  ? Assessment  ? Medical Diagnosis R26.9 (ICD-10-CM) - Gait disorder   ? Referring Provider (PT) [place], [place]  ? Precautions  ? Precautions  ICD/Pacemaker   ?  ? Restrictions  ? Weight Bearing Restrictions No   ?  ? Observation/Other Assessments  ? Focus on Therapeutic Outcomes (FOTO)  62%   ?  ? 6 minute walk test results   ? Aerobic Endurance Distance Walked 900   ? Endurance additional comments no shortness of breath or fatigue   ? ?  ?  ? ?  ? ? ? ? ? ? ? ? ? ? ? ? ? ? ? ? [place] Adult PT Treatment/Exercise - 04/01/22 0001   ? ?  ? Lumbar Exercises: Aerobic  ? Recumbent Bike L5 x 6 min   ? Other Aerobic Exercise gait x 1500' -cues to increase speed   ?  ? Lumbar Exercises: Machines for Strengthening  ? Cybex Knee Flexion 35# 3x10   ? Leg Press 25# 2 x 10   ? Other Lumbar Machine Exercise cybex row 25lb x 20 reps    ? Other Lumbar Machine Exercise lat pulls 25lb x20 reps   ? ?  ?  ? ?  ? ? ? ? ? ? ? ? ? ? ? ? PT Short Term Goals - 02/24/22 1703   ? ?  ? PT SHORT TERM GOAL #1  ? Title Pt will be independent with initial HEP.   ? Time 3   ? Period Weeks   ? Status Achieved   ? Target Date 02/25/22   ?  ? PT SHORT TERM GOAL #2  ? Title Complete Berg and/or DGI to further assess balance deficits   ? Time 2   ? Period Weeks   ? Status Achieved   ? Target Date 02/18/22   ? ?  ?  ? ?  ? ? ? ? PT Long Term Goals - 04/01/22 0948   ? ?  ? PT LONG TERM GOAL #1  ? Title Pt will be independent with advanced HEP.   ? Time 8   ? Period Weeks   ? Status Achieved   ? Target Date 04/01/22   ?  ? PT LONG TERM GOAL #2  ? Title Pt. will demonstrate improved LLE strength to 4+/5 throughout to decrease risk of falls.   ? Baseline 3/5-3+/5 LLE strength, increased pain   ? Time 8   ? Period Weeks   ? Status Achieved   ? Target Date 04/01/22   ?  ? PT LONG TERM GOAL #3  ? Title Pt. will demonstrate improved endurance by completing 1100 ft in 6 minutes without increased pain or DOE.   ? Baseline 878 ft,well below average for age/sex, increased L hip pain   ? Time 8   ? Period Weeks   ? Status Partially Met   03/16/22- at end of session so fatigued, 800' but no increase in LE pain. Unsteady feeling.  04/01/22- completed 900' in 6 minutes, no fatigue, able to continue walking and complete 1533f.  ? Target Date 04/01/22   ?  ? PT LONG TERM GOAL #4  ? Title Pt. will demonstrate improved functional strength by completing 5xSTS in <15 seconds.   ? Baseline 20 sec, increased L knee pain   ? Time 8   ? Period Weeks   ? Status --   03/16/22- 13 seconds without UE assist, increased L knee pain  ? Target Date 04/01/22   ?  ? PT LONG TERM GOAL #5  ? Title Patient to report 75% improvement in pain.   ? Baseline reports back,  L hip, L knee > R knee pain with activity   ? Time 8   ? Period Weeks   ? Status On-going   04/01/22- not restricted by knee pain with activities.   ? Target Date 04/01/22   ?  ? PT LONG TERM GOAL #6  ? Title Patient will demonstrate decreased fall risk with goal TBD by Berg/DGI   ? Baseline FGA - 24/30, Berg 53/56.  Not at increased risk of falls by these measures.   ? Time 8   ? Period Weeks   ? Status Achieved   ? Target Date 04/01/22   ? ?  ?  ? ?  ? ? ? ? ? ? ? ? Plan - 04/01/22 1231   ? ? Clinical Impression Statement Pt. has made good progress and has met all goals with exception of gait goal.  While he was not able to walk 1200' in 6 minutes, he was able to walk 1500' total without increased pain or dyspnea, demonstrating improving endurance and partially meeting that goal.  He is compliant with HEP and has transitioned to gym based exercise, going to [employer] for weight machines and [employer] for pool exercises.  Today reviewed exercises and gym equipment, suggesting exercises to add to regiment.  No evidence for instability noted with gait.  He is ready and appropriate for discharge.   ? Personal Factors and Comorbidities Age;Comorbidity 3+;Time since onset of injury/illness/exacerbation   ? Comorbidities Pacemaker, psoriatic arthritis, osteoporosis, Afib (anticoagulated), HOH, sick sinus syndrome, high cholesterol, hypothyroidism, OSA on CPAP, graves disease, bil arthroscopic knee surgery, left ventricular hypertropy, iliac aneurysm, neuropathy, CKD   ? ?  ?  ? ?  ? ? ?Patient will benefit from skilled therapeutic intervention in order to improve the following deficits and impairments:  Hypomobility, Decreased activity tolerance, Decreased strength, Increased fascial restricitons, Pain, Difficulty walking, Increased muscle spasms, Cardiopulmonary status limiting activity, Improper body mechanics, Decreased range of motion, Impaired flexibility, Postural dysfunction, Abnormal gait, Decreased balance, Decreased mobility, Decreased scar mobility, Decreased endurance ? ?Visit Diagnosis: ?Unsteadiness on feet ? ?Other abnormalities of gait and  mobility ? ?Chronic left-sided low back pain without sciatica ? ?Pain in left hip ? ?Stiffness of left hip, not elsewhere classified ? ?Muscle spasm of back ? ?Abnormal posture ? ?Muscle weakness (generalized) ? ?

## 2022-04-03 DIAGNOSIS — Z20822 Contact with and (suspected) exposure to covid-19: Secondary | ICD-10-CM | POA: Diagnosis not present

## 2022-04-06 DIAGNOSIS — Z20822 Contact with and (suspected) exposure to covid-19: Secondary | ICD-10-CM | POA: Diagnosis not present

## 2022-04-07 ENCOUNTER — Encounter: Payer: Self-pay | Admitting: Vascular Surgery

## 2022-04-07 DIAGNOSIS — Z20822 Contact with and (suspected) exposure to covid-19: Secondary | ICD-10-CM | POA: Diagnosis not present

## 2022-04-08 DIAGNOSIS — Z20822 Contact with and (suspected) exposure to covid-19: Secondary | ICD-10-CM | POA: Diagnosis not present

## 2022-04-09 DIAGNOSIS — Z20822 Contact with and (suspected) exposure to covid-19: Secondary | ICD-10-CM | POA: Diagnosis not present

## 2022-04-12 DIAGNOSIS — Z20822 Contact with and (suspected) exposure to covid-19: Secondary | ICD-10-CM | POA: Diagnosis not present

## 2022-04-13 ENCOUNTER — Other Ambulatory Visit: Payer: Self-pay | Admitting: Podiatry

## 2022-04-13 DIAGNOSIS — Z23 Encounter for immunization: Secondary | ICD-10-CM | POA: Diagnosis not present

## 2022-04-13 DIAGNOSIS — Z20822 Contact with and (suspected) exposure to covid-19: Secondary | ICD-10-CM | POA: Diagnosis not present

## 2022-04-14 ENCOUNTER — Ambulatory Visit (INDEPENDENT_AMBULATORY_CARE_PROVIDER_SITE_OTHER): Payer: Medicare Other | Admitting: Internal Medicine

## 2022-04-14 ENCOUNTER — Encounter: Payer: Self-pay | Admitting: Internal Medicine

## 2022-04-14 VITALS — BP 132/84 | HR 85 | Temp 98.3°F | Resp 18 | Ht 70.0 in | Wt 207.2 lb

## 2022-04-14 DIAGNOSIS — I70219 Atherosclerosis of native arteries of extremities with intermittent claudication, unspecified extremity: Secondary | ICD-10-CM

## 2022-04-14 DIAGNOSIS — D509 Iron deficiency anemia, unspecified: Secondary | ICD-10-CM | POA: Diagnosis not present

## 2022-04-14 DIAGNOSIS — G629 Polyneuropathy, unspecified: Secondary | ICD-10-CM

## 2022-04-14 DIAGNOSIS — R809 Proteinuria, unspecified: Secondary | ICD-10-CM

## 2022-04-14 DIAGNOSIS — E890 Postprocedural hypothyroidism: Secondary | ICD-10-CM | POA: Diagnosis not present

## 2022-04-14 DIAGNOSIS — E039 Hypothyroidism, unspecified: Secondary | ICD-10-CM | POA: Diagnosis not present

## 2022-04-14 DIAGNOSIS — I10 Essential (primary) hypertension: Secondary | ICD-10-CM

## 2022-04-14 NOTE — Patient Instructions (Addendum)
?  You have neuropathy.  Take gabapentin 3 times a day every day ? ?Proceed with blood work ? ? ? ? ?Come back in 3 months, please make an appointment. ? ?  ?

## 2022-04-14 NOTE — Progress Notes (Signed)
? ?Subjective:  ? ? Patient ID: Martin Rubio, male    DOB: [birth year], 75 y.o.   MRN: [id number] ? ?DOS:  04/14/2022 ?Type of visit - description: Follow-up ? ?His main concern today is neuropathy, described as a burning feeling on the plantar area, bilaterally, going on for a while. ?Has history of anemia, denies nausea vomiting diarrhea.  No blood in the stools. ?History of PVD, Notes from cardiology reviewed ?Sees urology, unable to see his office visit notes. ? ?Review of Systems ?See above  ? ?Past Medical History:  ?Diagnosis Date  ? Bronchitis   ? Diverticulosis   ? FH: BPH (benign prostatic hypertrophy)   ? History of Graves' disease 01/19/2018  ? S/p ablation  ? HTN (hypertension)   ? Hyperlipidemia   ? Obesity   ? Psoriatic arthritis ([place])   ? Rheumatoid arthritis ([place]) 08/07/2014  ? Sleep apnea   ? CPAP  ? Thyroid disease   ? ? ?Past Surgical History:  ?Procedure Laterality Date  ? ABDOMINAL AORTOGRAM W/LOWER EXTREMITY N/A 01/14/2022  ? Procedure: ABDOMINAL AORTOGRAM W/LOWER EXTREMITY;  Surgeon: Wellington Hampshire, MD;  Location: [facility];  Service: Cardiovascular;  Laterality: N/A;  ? INGUINAL HERNIA REPAIR    ? bilateral  ? KNEE SURGERY    ? bilateral arthroscopic  ? l foot surgery Left 12/2020  ? PACEMAKER IMPLANT N/A 12/13/2019  ? Procedure: PACEMAKER IMPLANT;  Surgeon: Evans Lance, MD;  Location: [facility];  Service: Cardiovascular;  Laterality: N/A;  ? TRANSURETHRAL RESECTION OF PROSTATE    ? UMBILICAL HERNIA REPAIR    ? ? ?Current Outpatient Medications  ?Medication Instructions  ? acetaminophen (TYLENOL) 650 mg, Oral, Every 6 hours PRN  ? amLODipine (NORVASC) 5 mg, Oral, Daily  ? Ascorbic Acid (VITAMIN C ADULT GUMMIES PO) 1 each, Oral, Daily  ? cycloSPORINE (RESTASIS) 0.05 % ophthalmic emulsion 1 drop, Both Eyes, 2 times daily  ? diclofenac Sodium (VOLTAREN) 4 g, Topical, 2 times daily  ? dofetilide (TIKOSYN) 250 MCG capsule TAKE ONE CAPSULE BY MOUTH TWICE A DAY  ? Enbrel 50 mg,  Subcutaneous, Weekly  ? ferrous fumarate (HEMOCYTE - 106 MG FE) 325 (106 Fe) MG TABS tablet 106 mg of iron, Oral, 2 times daily  ? fluticasone (FLONASE) 50 MCG/ACT nasal spray 1 spray, Each Nare, As needed  ? folic acid (FOLVITE) 381 mcg, Oral, Daily  ? gabapentin (NEURONTIN) 100 mg, Oral, 3 times daily  ? hydrOXYzine (VISTARIL) 25 mg, Oral, Every 8 hours PRN  ? Krill Oil 350 mg, Oral, Daily  ? levothyroxine (SYNTHROID) 150 mcg, Oral, Daily before breakfast  ? losartan (COZAAR) 50 mg, Oral, 2 times daily  ? MAGNESIUM GLYCINATE PO 1 tablet, Oral, Daily  ? meclizine (ANTIVERT) 25 mg, Oral, 3 times daily PRN  ? melatonin 5 mg, Oral, Daily at bedtime  ? omeprazole (PRILOSEC) 20 mg, Oral, Daily, Buys the [employer] brand.   ? Praluent 150 mg, Subcutaneous, Every 14 days  ? Rivaroxaban (XARELTO) 15 MG TABS tablet Take 1 tablet by mouth daily, with a full meal.  ? tadalafil (PAH) (ADCIRCA) 20 mg, Oral, Daily PRN  ? tamsulosin (FLOMAX) 0.4 mg, Oral, Daily at bedtime  ? triamcinolone cream (KENALOG) 0.1 % 1 application., Topical, Daily PRN  ? Vitamin B 12 1,000 mcg, Oral, Daily  ? Vitamin D 5,000 Units, Oral, Daily  ? ? ?   ?Objective:  ? Physical Exam ?BP 132/84 (BP Location: Left Arm, Patient Position: Sitting,  Cuff Size: Small)   Pulse 85   Temp 98.3 ?F (36.8 ?C) (Oral)   Resp 18   Ht '5\' 10"'$  (1.778 m)   Wt 207 lb 4 oz (94 kg)   SpO2 98%   BMI 29.74 kg/m?  ? ?General:   ?Well developed, NAD, BMI noted. ?HEENT:  ?Normocephalic . Face symmetric, atraumatic ?Lungs:  ?CTA B ?Normal respiratory effort, no intercostal retractions, no accessory muscle use. ?Heart: Seems regular today, trace systolic murmur. ?Lower extremities: no pretibial edema bilaterally.  Pedal pulses present. ?Pinprick examination: Able to feel but at the plantar area the feeling was different from more proximal areas. ?Skin: Not pale. Not jaundice ?Neurologic:  ?alert & oriented X3.  ?Speech normal, gait appropriate for age and unassisted ?Psych--   ?Cognition and judgment appear intact.  ?Cooperative with normal attention span and concentration.  ?Behavior appropriate. ?No anxious or depressed appearing.  ? ?   ?Assessment   ? ? ?ASSESSMENT  (transfer to me 12/2019) ?Hyperglycemia ?HTN ?High cholesterol, seen at the lipid clinic ?Hypothyroidism ? Psoriatic Arthritis [name]  ?Osteoporosis: on fosamax rx by PCP, start holiday by mid 2022 (see visit note from 02/29/2020) ?CKD mild,stable . Creatinine ~1.5 ?CV: ?-Paroxysmal A. Fib, dx 09/2019 anticoagulated ?-Sick sinus syndrome, s/p pacemaker (12/13/2019) ?-Septal hypertrophy per echo ?-Left common iliac aneurysm, AAA  ?- PVD ?OSA on CPAP ?BPH - sees urology ?HOH  ?Iron deficiency anemia: 12/02/2021 >> EGD (several polyps suggestive of FGP), C-scope:no polyps, no cancer. ?Pathology: benign ? ? ?PLAN ?Hyperglycemia: Last A1c satisfactory ?HTN: On amlodipine, losartan, check a BMP. ?High cholesterol: On Praluent, per lipid clinic ?Hypothyroidism: Last TSH elevated, recheck today. ?CKD, proteinuria: ?Patient has a history of CKD, creatinine is hovering around 1.6, 1.4.  Recently was told by urology he has proteinuria and was "referred to a specialist". ?Plan: Checking kidney function, UA, micro, refer to nephrology, renal US.  Addendum: patient declined a referral or  a [place]  " they have done that already". ?PVD, paroxysmal A-fib, aneurysm abdominal aortic. ?Saw Dr. Fletcher Anon, 02/17/2022, had a CT angio lower extremities 02/26/2022, then saw vascular surgery, they agreed on conservative treatment and routine follow-ups. Marland Kitchen ?Neuropathy: In previous visits he has mention feet pain, today's description fits neuropathy diagnosis. ?I am not aware of any formal evaluation except that her recent Y07 and folic acid are okay. ?Was prescribed gabapentin by podiatry but takes it only as needed. ?Plan: Gabapentin 3 times daily, call for refill if needed.  Refer to neurology the next opportunity  ?BPH: sees urology   No OV notes  available  ?Iron deficiency anemia: No GI sxs, reportedly had a capsule endoscopy but has not been able to get results from GI.  I do not have access to the results. Check labs. ?Encouraged to ask other MDs to send me records ?RTC 3 months ?  ?

## 2022-04-15 ENCOUNTER — Encounter: Payer: Self-pay | Admitting: Internal Medicine

## 2022-04-15 LAB — CBC WITH DIFFERENTIAL/PLATELET
Basophils Absolute: 0.1 10*3/uL (ref 0.0–0.1)
Basophils Relative: 1.3 % (ref 0.0–3.0)
Eosinophils Absolute: 0.2 10*3/uL (ref 0.0–0.7)
Eosinophils Relative: 3.1 % (ref 0.0–5.0)
HCT: 46.9 % (ref 39.0–52.0)
Hemoglobin: 15.6 g/dL (ref 13.0–17.0)
Lymphocytes Relative: 27.8 % (ref 12.0–46.0)
Lymphs Abs: 2 10*3/uL (ref 0.7–4.0)
MCHC: 33.3 g/dL (ref 30.0–36.0)
MCV: 82.4 fl (ref 78.0–100.0)
Monocytes Absolute: 0.7 10*3/uL (ref 0.1–1.0)
Monocytes Relative: 9.9 % (ref 3.0–12.0)
Neutro Abs: 4.1 10*3/uL (ref 1.4–7.7)
Neutrophils Relative %: 57.9 % (ref 43.0–77.0)
Platelets: 150 10*3/uL (ref 150.0–400.0)
RBC: 5.69 Mil/uL (ref 4.22–5.81)
RDW: 16.6 % — ABNORMAL HIGH (ref 11.5–15.5)
WBC: 7.1 10*3/uL (ref 4.0–10.5)

## 2022-04-15 LAB — URINALYSIS, ROUTINE W REFLEX MICROSCOPIC
Bilirubin Urine: NEGATIVE
Ketones, ur: NEGATIVE
Leukocytes,Ua: NEGATIVE
Nitrite: NEGATIVE
Specific Gravity, Urine: 1.015 (ref 1.000–1.030)
Total Protein, Urine: 30 — AB
Urine Glucose: NEGATIVE
Urobilinogen, UA: 0.2 (ref 0.0–1.0)
pH: 5.5 (ref 5.0–8.0)

## 2022-04-15 LAB — TSH: TSH: 1.24 u[IU]/mL (ref 0.35–5.50)

## 2022-04-15 LAB — MICROALBUMIN / CREATININE URINE RATIO
Creatinine,U: 54.6 mg/dL
Microalb Creat Ratio: 70.9 mg/g — ABNORMAL HIGH (ref 0.0–30.0)
Microalb, Ur: 38.7 mg/dL — ABNORMAL HIGH (ref 0.0–1.9)

## 2022-04-15 LAB — FERRITIN: Ferritin: 22.1 ng/mL (ref 22.0–322.0)

## 2022-04-15 LAB — BASIC METABOLIC PANEL
BUN: 21 mg/dL (ref 6–23)
CO2: 26 mEq/L (ref 19–32)
Calcium: 9.5 mg/dL (ref 8.4–10.5)
Chloride: 106 mEq/L (ref 96–112)
Creatinine, Ser: 1.53 mg/dL — ABNORMAL HIGH (ref 0.40–1.50)
GFR: 43.62 mL/min — ABNORMAL LOW (ref >60.00)
Glucose, Bld: 107 mg/dL — ABNORMAL HIGH (ref 70–99)
Potassium: 4.4 mEq/L (ref 3.5–5.1)
Sodium: 141 mEq/L (ref 135–145)

## 2022-04-15 LAB — IRON: Iron: 55 ug/dL (ref 42–165)

## 2022-04-15 LAB — RPR: RPR Ser Ql: NONREACTIVE

## 2022-04-15 NOTE — Assessment & Plan Note (Signed)
Hyperglycemia: Last A1c satisfactory ?HTN: On amlodipine, losartan, check a BMP. ?High cholesterol: On Praluent, per lipid clinic ?Hypothyroidism: Last TSH elevated, recheck today. ?CKD, proteinuria: ?Patient has a history of CKD, creatinine is hovering around 1.6, 1.4.  Recently was told by urology he has proteinuria and was "referred to a specialist". ?Plan: Checking kidney function, UA, micro, refer to nephrology, renal US.  Addendum: patient declined a referral or  a [place]  " they have done that already". ?PVD, paroxysmal A-fib, aneurysm abdominal aortic. ?Saw Dr. Fletcher Anon, 02/17/2022, had a CT angio lower extremities 02/26/2022, then saw vascular surgery, they agreed on conservative treatment and routine follow-ups. Marland Kitchen ?Neuropathy: In previous visits he has mention feet pain, today's description fits neuropathy diagnosis. ?I am not aware of any formal evaluation except that her recent O16 and folic acid are okay. ?Was prescribed gabapentin by podiatry but takes it only as needed. ?Plan: Gabapentin 3 times daily, call for refill if needed.  Refer to neurology the next opportunity  ?BPH: sees urology   No OV notes available  ?Iron deficiency anemia: No GI sxs, reportedly had a capsule endoscopy but has not been able to get results from GI.  I do not have access to the results. Check labs. ?Encouraged to ask other MDs to send me records ?RTC 3 months ?  ?

## 2022-04-16 ENCOUNTER — Telehealth: Payer: Self-pay | Admitting: *Deleted

## 2022-04-16 NOTE — Telephone Encounter (Signed)
Patient stated that he received a call.  It looks like you left a message for him about labs but not sure what it was about.  I advised that I would let you know. ?

## 2022-04-16 NOTE — Telephone Encounter (Signed)
Spoke with the patient. ?- Creatinine increasing, had CT angio 02/23/2022, some renal artery stenosis noted.  Kidney mildly atrophic.  He tells me today  that he already talked with the nephrology office and they are working on scheduling a visit. ?-Patient received note from GI,  his capsule endoscopy was negative.  Labs this office showed  his anemia is resolved and ferritin is better.  We agreed that he will continue taking iron supplements for few months. ? ?

## 2022-04-28 DIAGNOSIS — R531 Weakness: Secondary | ICD-10-CM | POA: Diagnosis not present

## 2022-04-28 DIAGNOSIS — R2681 Unsteadiness on feet: Secondary | ICD-10-CM | POA: Diagnosis not present

## 2022-05-05 ENCOUNTER — Ambulatory Visit (HOSPITAL_BASED_OUTPATIENT_CLINIC_OR_DEPARTMENT_OTHER)
Admission: RE | Admit: 2022-05-05 | Discharge: 2022-05-05 | Disposition: A | Discharge status: Home / Self Care | Payer: Medicare Other | Source: Ambulatory Visit | Attending: Medical | Admitting: Medical

## 2022-05-05 ENCOUNTER — Ambulatory Visit (INDEPENDENT_AMBULATORY_CARE_PROVIDER_SITE_OTHER): Payer: Medicare Other | Admitting: Medical

## 2022-05-05 VITALS — BP 135/63 | HR 59 | Resp 18 | Ht 70.0 in | Wt 214.0 lb

## 2022-05-05 DIAGNOSIS — R0781 Pleurodynia: Secondary | ICD-10-CM | POA: Insufficient documentation

## 2022-05-05 DIAGNOSIS — G629 Polyneuropathy, unspecified: Secondary | ICD-10-CM | POA: Diagnosis not present

## 2022-05-05 DIAGNOSIS — S46812A Strain of other muscles, fascia and tendons at shoulder and upper arm level, left arm, initial encounter: Secondary | ICD-10-CM

## 2022-05-05 DIAGNOSIS — R739 Hyperglycemia, unspecified: Secondary | ICD-10-CM | POA: Diagnosis not present

## 2022-05-05 DIAGNOSIS — Z0389 Encounter for observation for other suspected diseases and conditions ruled out: Secondary | ICD-10-CM | POA: Diagnosis not present

## 2022-05-05 DIAGNOSIS — I70219 Atherosclerosis of native arteries of extremities with intermittent claudication, unspecified extremity: Secondary | ICD-10-CM | POA: Diagnosis not present

## 2022-05-05 DIAGNOSIS — R0789 Other chest pain: Secondary | ICD-10-CM

## 2022-05-05 DIAGNOSIS — Z950 Presence of cardiac pacemaker: Secondary | ICD-10-CM | POA: Diagnosis not present
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
## 2022-05-11 ENCOUNTER — Other Ambulatory Visit: Payer: Self-pay | Admitting: Cardiology

## 2022-05-11 NOTE — Telephone Encounter (Signed)
Prescription refill request for Xarelto received.  Indication:Afib Last office visit:3/23 Weight:97.1 kg Age:76 Scr:1.5 CrCl:56.64 ml/min  Prescription refilled

## 2022-05-13 ENCOUNTER — Other Ambulatory Visit: Payer: Self-pay | Admitting: Podiatry

## 2022-05-13 DIAGNOSIS — N401 Enlarged prostate with lower urinary tract symptoms: Secondary | ICD-10-CM | POA: Diagnosis not present

## 2022-05-13 DIAGNOSIS — N184 Chronic kidney disease, stage 4 (severe): Secondary | ICD-10-CM | POA: Diagnosis not present

## 2022-05-13 DIAGNOSIS — N529 Male erectile dysfunction, unspecified: Secondary | ICD-10-CM | POA: Diagnosis not present

## 2022-05-13 DIAGNOSIS — N138 Other obstructive and reflux uropathy: Secondary | ICD-10-CM | POA: Diagnosis not present

## 2022-05-20 ENCOUNTER — Ambulatory Visit (INDEPENDENT_AMBULATORY_CARE_PROVIDER_SITE_OTHER): Payer: Medicare Other | Admitting: Podiatry

## 2022-05-20 DIAGNOSIS — G5792 Unspecified mononeuropathy of left lower limb: Secondary | ICD-10-CM

## 2022-05-20 DIAGNOSIS — M79675 Pain in left toe(s): Secondary | ICD-10-CM | POA: Diagnosis not present

## 2022-05-20 DIAGNOSIS — G629 Polyneuropathy, unspecified: Secondary | ICD-10-CM | POA: Diagnosis not present

## 2022-05-20 DIAGNOSIS — B351 Tinea unguium: Secondary | ICD-10-CM | POA: Diagnosis not present

## 2022-05-20 DIAGNOSIS — M79674 Pain in right toe(s): Secondary | ICD-10-CM | POA: Diagnosis not present

## 2022-05-22 DIAGNOSIS — R262 Difficulty in walking, not elsewhere classified: Secondary | ICD-10-CM | POA: Diagnosis not present

## 2022-05-22 DIAGNOSIS — M25671 Stiffness of right ankle, not elsewhere classified: Secondary | ICD-10-CM | POA: Diagnosis not present

## 2022-05-22 DIAGNOSIS — M25672 Stiffness of left ankle, not elsewhere classified: Secondary | ICD-10-CM | POA: Diagnosis not present

## 2022-05-22 DIAGNOSIS — G629 Polyneuropathy, unspecified: Secondary | ICD-10-CM | POA: Diagnosis not present

## 2022-05-22 DIAGNOSIS — R531 Weakness: Secondary | ICD-10-CM | POA: Diagnosis not present

## 2022-05-22 DIAGNOSIS — M25571 Pain in right ankle and joints of right foot: Secondary | ICD-10-CM | POA: Diagnosis not present

## 2022-05-22 DIAGNOSIS — M25572 Pain in left ankle and joints of left foot: Secondary | ICD-10-CM | POA: Diagnosis not present

## 2022-05-23 ENCOUNTER — Other Ambulatory Visit: Payer: Self-pay | Admitting: Podiatry

## 2022-05-25 NOTE — Telephone Encounter (Signed)
Please advise 

## 2022-05-26 DIAGNOSIS — M25671 Stiffness of right ankle, not elsewhere classified: Secondary | ICD-10-CM | POA: Diagnosis not present

## 2022-05-26 DIAGNOSIS — R531 Weakness: Secondary | ICD-10-CM | POA: Diagnosis not present

## 2022-05-26 DIAGNOSIS — M25572 Pain in left ankle and joints of left foot: Secondary | ICD-10-CM | POA: Diagnosis not present

## 2022-05-26 DIAGNOSIS — R262 Difficulty in walking, not elsewhere classified: Secondary | ICD-10-CM | POA: Diagnosis not present

## 2022-05-26 DIAGNOSIS — G629 Polyneuropathy, unspecified: Secondary | ICD-10-CM | POA: Diagnosis not present

## 2022-05-26 DIAGNOSIS — M25672 Stiffness of left ankle, not elsewhere classified: Secondary | ICD-10-CM | POA: Diagnosis not present

## 2022-05-26 DIAGNOSIS — M25571 Pain in right ankle and joints of right foot: Secondary | ICD-10-CM | POA: Diagnosis not present

## 2022-05-28 DIAGNOSIS — G629 Polyneuropathy, unspecified: Secondary | ICD-10-CM | POA: Diagnosis not present

## 2022-05-28 DIAGNOSIS — M25672 Stiffness of left ankle, not elsewhere classified: Secondary | ICD-10-CM | POA: Diagnosis not present

## 2022-05-28 DIAGNOSIS — R262 Difficulty in walking, not elsewhere classified: Secondary | ICD-10-CM | POA: Diagnosis not present

## 2022-05-28 DIAGNOSIS — M25572 Pain in left ankle and joints of left foot: Secondary | ICD-10-CM | POA: Diagnosis not present

## 2022-05-28 DIAGNOSIS — M25571 Pain in right ankle and joints of right foot: Secondary | ICD-10-CM | POA: Diagnosis not present

## 2022-05-28 DIAGNOSIS — M25671 Stiffness of right ankle, not elsewhere classified: Secondary | ICD-10-CM | POA: Diagnosis not present

## 2022-05-28 DIAGNOSIS — R531 Weakness: Secondary | ICD-10-CM | POA: Diagnosis not present

## 2022-05-29 DIAGNOSIS — R531 Weakness: Secondary | ICD-10-CM | POA: Diagnosis not present

## 2022-05-29 DIAGNOSIS — M25671 Stiffness of right ankle, not elsewhere classified: Secondary | ICD-10-CM | POA: Diagnosis not present

## 2022-05-29 DIAGNOSIS — M25571 Pain in right ankle and joints of right foot: Secondary | ICD-10-CM | POA: Diagnosis not present

## 2022-05-29 DIAGNOSIS — M25572 Pain in left ankle and joints of left foot: Secondary | ICD-10-CM | POA: Diagnosis not present

## 2022-05-29 DIAGNOSIS — M25672 Stiffness of left ankle, not elsewhere classified: Secondary | ICD-10-CM | POA: Diagnosis not present

## 2022-05-29 DIAGNOSIS — G629 Polyneuropathy, unspecified: Secondary | ICD-10-CM | POA: Diagnosis not present

## 2022-05-29 DIAGNOSIS — R262 Difficulty in walking, not elsewhere classified: Secondary | ICD-10-CM | POA: Diagnosis not present

## 2022-06-01 DIAGNOSIS — R262 Difficulty in walking, not elsewhere classified: Secondary | ICD-10-CM | POA: Diagnosis not present

## 2022-06-01 DIAGNOSIS — G629 Polyneuropathy, unspecified: Secondary | ICD-10-CM | POA: Diagnosis not present

## 2022-06-01 DIAGNOSIS — M25672 Stiffness of left ankle, not elsewhere classified: Secondary | ICD-10-CM | POA: Diagnosis not present

## 2022-06-01 DIAGNOSIS — M25572 Pain in left ankle and joints of left foot: Secondary | ICD-10-CM | POA: Diagnosis not present

## 2022-06-01 DIAGNOSIS — M25671 Stiffness of right ankle, not elsewhere classified: Secondary | ICD-10-CM | POA: Diagnosis not present

## 2022-06-01 DIAGNOSIS — R531 Weakness: Secondary | ICD-10-CM | POA: Diagnosis not present

## 2022-06-01 DIAGNOSIS — M25571 Pain in right ankle and joints of right foot: Secondary | ICD-10-CM | POA: Diagnosis not present

## 2022-06-03 NOTE — Progress Notes (Signed)
Office Note    HPI: Martin Rubio is a 76 y.o. (1946-03-14) male presenting in follow-up with known, small AAA, bilateral iliac artery stenosis, left common iliac artery aneurysm.  On exam, Martin Rubio was doing well, accompanied by his wife.  A [place] native, he moved down to [place] years ago.  He is an Chief Financial Officer by trade.   Since last seen 3 months ago, Martin Rubio has had significant weight gain which is precipitated waxing and waning lower back pain.  This has happened previously for which he participated in water aerobics which helped significantly.  His spine surgeon is Dr. Rolena Infante.   Martin Rubio appreciates stable, mild claudication symptoms, which present after 30 minutes of ambulation.  He is only ambulating for this amount of time on an infrequent basis.  His wife would like for both of them to increase their activity levels.  He is very involved in [place], and has a strong faith. He cannot take statins due to myalgia, and has a history of GI ulcer and does not take ASA.  The pt is not on a statin for cholesterol management.  The pt is not on a daily aspirin.   Other AC:  Xarelto The pt is  on medication for hypertension.   The pt is not diabetic.  Tobacco hx:  -  Past Medical History:  Diagnosis Date   Bronchitis    Diverticulosis    FH: BPH (benign prostatic hypertrophy)    History of Graves' disease 01/19/2018   S/p ablation   HTN (hypertension)    Hyperlipidemia    Obesity    Psoriatic arthritis ([place])    Rheumatoid arthritis ([place]) 08/07/2014   Sleep apnea    CPAP   Thyroid disease     Past Surgical History:  Procedure Laterality Date   ABDOMINAL AORTOGRAM W/LOWER EXTREMITY N/A 01/14/2022   Procedure: ABDOMINAL AORTOGRAM W/LOWER EXTREMITY;  Surgeon: Wellington Hampshire, MD;  Location: [facility];  Service: Cardiovascular;  Laterality: N/A;   INGUINAL HERNIA REPAIR     bilateral   KNEE SURGERY     bilateral arthroscopic   l foot surgery Left 12/2020   PACEMAKER  IMPLANT N/A 12/13/2019   Procedure: PACEMAKER IMPLANT;  Surgeon: Evans Lance, MD;  Location: [facility];  Service: Cardiovascular;  Laterality: N/A;   TRANSURETHRAL RESECTION OF PROSTATE     UMBILICAL HERNIA REPAIR      Social History   Socioeconomic History   Marital status: Married    Spouse name: Not on file   Number of children: 2   Years of education: Not on file   Highest education level: Not on file  Occupational History   Occupation: Retired    Fish farm manager: [employer]  Tobacco Use   Smoking status: Former    Types: Cigarettes   Smokeless tobacco: Never   Tobacco comments:    Quit 46 years ago.  Vaping Use   Vaping Use: Never used  Substance and Sexual Activity   Alcohol use: No   Drug use: No   Sexual activity: Not on file  Other Topics Concern   Not on file  Social History Narrative   Lives with wife.   Social Determinants of Health   Financial Resource Strain: Low Risk  (01/26/2022)   Overall Financial Resource Strain (CARDIA)    Difficulty of Paying Living Expenses: Not hard at all  Food Insecurity: No Food Insecurity (01/26/2022)   Hunger Vital Sign    Worried About  Running Out of Food in the Last Year: Never true    [name] in the Last Year: Never true  Transportation Needs: No Transportation Needs (01/26/2022)   PRAPARE - Hydrologist (Medical): No    Lack of Transportation (Non-Medical): No  Physical Activity: Inactive (01/26/2022)   Exercise Vital Sign    Days of Exercise per Week: 0 days    Minutes of Exercise per Session: 0 min  Stress: No Stress Concern Present (01/26/2022)   [place]    Feeling of Stress : Not at all  Social Connections: Moderately Integrated (01/26/2022)   Social Connection and Isolation Panel [NHANES]    Frequency of Communication with Friends and Family: Three times a week    Frequency of Social Gatherings with  Friends and Family: Twice a week    Attends Religious Services: More than 4 times per year    Active Member of [employer] or Organizations: No    Attends Archivist Meetings: Never    Marital Status: Married  Human resources officer Violence: Not At Risk (01/26/2022)   Humiliation, Afraid, Rape, and Kick questionnaire    Fear of Current or Ex-Partner: No    Emotionally Abused: No    Physically Abused: No    Sexually Abused: No   Family History  Problem Relation Age of Onset   CAD Mother 58       CABG   CAD Father 79       Died age 44   Diabetes Brother    Prostate cancer Neg Hx    Colon cancer Neg Hx     Current Outpatient Medications  Medication Sig Dispense Refill   acetaminophen (TYLENOL) 325 MG tablet Take 650 mg by mouth every 6 (six) hours as needed for mild pain.     Alirocumab (PRALUENT) 150 MG/ML SOAJ Inject 150 mg into the skin every 14 (fourteen) days. 2 mL 11   amLODipine (NORVASC) 5 MG tablet Take 1 tablet (5 mg total) by mouth daily. 90 tablet 3   Ascorbic Acid (VITAMIN C ADULT GUMMIES PO) Take 1 each by mouth daily.     Cholecalciferol (VITAMIN D) 125 MCG (5000 UT) CAPS Take 5,000 Units by mouth daily.      Cyanocobalamin (VITAMIN B 12) 500 MCG TABS Take 1,000 mcg by mouth daily.      cycloSPORINE (RESTASIS) 0.05 % ophthalmic emulsion Place 1 drop into both eyes 2 (two) times daily.     diclofenac Sodium (VOLTAREN) 1 % GEL Apply 4 g topically in the morning and at bedtime. 100 g 6   dofetilide (TIKOSYN) 250 MCG capsule TAKE ONE CAPSULE BY MOUTH TWICE A DAY 180 capsule 2   etanercept (ENBREL) 50 MG/ML injection Inject 50 mg into the skin once a week.     ferrous fumarate (HEMOCYTE - 106 MG FE) 325 (106 Fe) MG TABS tablet Take 1 tablet (106 mg of iron total) by mouth 2 (two) times daily. 180 tablet 1   fluticasone (FLONASE) 50 MCG/ACT nasal spray Place 1 spray into both nostrils as needed for allergies.     folic acid (FOLVITE) 810 MCG tablet Take 800 mcg by mouth  daily.     gabapentin (NEURONTIN) 100 MG capsule Take 1 capsule (100 mg total) by mouth 3 (three) times daily. 90 capsule 1   hydrOXYzine (VISTARIL) 25 MG capsule TAKE 1 CAPSULE (25 MG TOTAL) BY MOUTH EVERY 8 (EIGHT)  HOURS AS NEEDED. 60 capsule 1   Krill Oil 350 MG CAPS Take 350 mg by mouth daily.     levothyroxine (SYNTHROID) 150 MCG tablet Take 1 tablet (150 mcg total) by mouth daily before breakfast. 90 tablet 0   losartan (COZAAR) 50 MG tablet TAKE 1 TABLET (50 MG TOTAL) BY MOUTH IN THE MORNING AND AT BEDTIME. (Patient taking differently: Take 25 mg by mouth in the morning and at bedtime.) 180 tablet 3   MAGNESIUM GLYCINATE PO Take 1 tablet by mouth daily.     meclizine (ANTIVERT) 25 MG tablet Take 25 mg by mouth 3 (three) times daily as needed for dizziness.     Melatonin 5 MG TABS Take 5 mg by mouth at bedtime.     omeprazole (PRILOSEC) 20 MG capsule Take 20 mg by mouth daily. Buys the [employer] brand.     pregabalin (LYRICA) 50 MG capsule Take 50 mg by mouth daily.     tadalafil, PAH, (ADCIRCA) 20 MG tablet Take 20 mg by mouth daily as needed.     tamsulosin (FLOMAX) 0.4 MG CAPS capsule Take 0.4 mg by mouth at bedtime.     triamcinolone cream (KENALOG) 0.1 % Apply 1 application topically daily as needed for itching.     XARELTO 15 MG TABS tablet TAKE ONE TABLET BY MOUTH ONE TIME DAILY WITH A FULL MEAL 90 tablet 1   No current facility-administered medications for this visit.    Allergies  Allergen Reactions   Codeine     Tension, nasty feeling    Nsaids Other (See Comments)    Renal insufficiency   Prednisone Other (See Comments)    Unknown/ sometimes takes with lower dose   Statins Other (See Comments)    Joint pain    Sulfa Antibiotics Other (See Comments)    Joint pain     REVIEW OF SYSTEMS:   '[X]'$  denotes positive finding, '[ ]'$  denotes negative finding Cardiac  Comments:  Chest pain or chest pressure:    Shortness of breath upon exertion:    Short of breath when lying  flat:    Irregular heart rhythm:        Vascular    Pain in calf, thigh, or hip brought on by ambulation:    Pain in feet at night that wakes you up from your sleep:     Blood clot in your veins:    Leg swelling:         Pulmonary    Oxygen at home:    Productive cough:     Wheezing:         Neurologic    Sudden weakness in arms or legs:     Sudden numbness in arms or legs:     Sudden onset of difficulty speaking or slurred speech:    Temporary loss of vision in one eye:     Problems with dizziness:         Gastrointestinal    Blood in stool:     Vomited blood:         Genitourinary    Burning when urinating:     Blood in urine:        Psychiatric    Major depression:         Hematologic    Bleeding problems:    Problems with blood clotting too easily:        Skin    Rashes or ulcers:        Constitutional  Fever or chills:      PHYSICAL EXAMINATION:  There were no vitals filed for this visit.  General:  WDWN in NAD; vital signs documented above Gait: Not observed HENT: WNL, normocephalic Pulmonary: normal non-labored breathing , without wheezing Cardiac: regular HR,  Abdomen: soft, NT, no masses Skin: without rashes Vascular Exam/Pulses:  Right Left  Radial 2+ (normal) 2+ (normal)  Ulnar 2+ (normal) 2+ (normal)  Femoral 2+ (normal) 1+ (weak)  Popliteal    DP 2+ (normal) 2+ (normal)  PT     Extremities: without ischemic changes, without Gangrene , without cellulitis; without open wounds;  Musculoskeletal: no muscle wasting or atrophy  Neurologic: A&O X 3;  No focal weakness or paresthesias are detected Psychiatric:  The pt has Normal affect.   Non-Invasive Vascular Imaging:      1. Aneurysm of the infrarenal abdominal aorta measuring up to 3.1 cm. Recommend follow-up every 3 years. This recommendation follows ACR consensus guidelines: White Paper of the ACR Incidental Findings Committee II on Vascular Findings. J Am Coll Radiol  2013; 10:789-794. 2. Irregular appearance of the celiac artery with areas of mild stenosis and dilatation measuring up to 1.0 cm. 3. Irregular appearance of the right common iliac artery with multiple linear low-density structures suspicious for non flow limiting dissections. Dilated the proximal external iliac artery measuring up to 1.3 cm. 4. Severe stenosis of the origin of the left common iliac artery with post-stenotic aneurysm measuring up to 2.1 cm. Dilated left external iliac artery measuring up to 1.3 cm. 5. Pattern of vascular irregularity in aneurysm is suspicious for collagen vascular disease.  ABI Findings:  +---------+------------------+-----+---------+--------+  Right    Rt Pressure (mmHg)IndexWaveform Comment   +---------+------------------+-----+---------+--------+  Brachial 163                                       +---------+------------------+-----+---------+--------+  PTA      171               1.04 triphasic          +---------+------------------+-----+---------+--------+  DP       164               0.99 biphasic           +---------+------------------+-----+---------+--------+  Great Toe117               0.71                    +---------+------------------+-----+---------+--------+   +---------+------------------+-----+--------+-------+  Left     Lt Pressure (mmHg)IndexWaveformComment  +---------+------------------+-----+--------+-------+  Brachial 165                                     +---------+------------------+-----+--------+-------+  PTA      145               0.88 biphasic         +---------+------------------+-----+--------+-------+  DP       144               0.87 biphasic         +---------+------------------+-----+--------+-------+  Great Toe115               0.70                  +---------+------------------+-----+--------+-------+    +-------+-----------+-----------+------------+------------+  ABI/TBIToday's ABIToday's TBIPrevious ABIPrevious TBI  +-------+-----------+-----------+------------+------------+  Right  1.07       0.71                                 +-------+-----------+-----------+------------+------------+  Left   0.88       0.70                                 +-------+-----------+-----------+------------+------------+   ASSESSMENT/PLAN: DUSTINE STICKLER is a 76 y.o. male presenting with mixed aneurysmal occlusive disease.  Patient has a small 3.1 cm infrarenal abdominal aneurysm.  Significant stenosis of the left common iliac artery with poststenotic aneurysm measuring 2.1 cm.  There is ectasia at the right external iliac artery as well measuring 1.3 cm.  On follow-up today, Shanon Brow was doing well.  He has had had some weight gain over the last 3 months, which is caused some lower back pain to recur.  Aneurysms were not re-measured, ABI was obtained demonstrating mild peripheral arterial disease in the left leg, this is likely due to the known left common iliac artery stenosis.  At this time, Shanon Brow does not have lifestyle limiting claudication, we discussed the need for a supervised exercise routine - 30 minutes of walking 5 times daily.  Regarding his aneurysms, repair would would be offered with an increase in aneurysm size to greater than 5.5 in the infrarenal abdominal aorta, greater than 3.5 in the left common iliac artery.  From a peripheral arterial standpoint, intervention is reserved for lifestyle limiting claudication, rest pain, tissue loss.  He was asked to continue his current medication regimen.  I will see Marin again in 9 months to evaluate his claudication symptoms as well as repeat imaging of his aortoiliac aneurysms.  He is aware that repair of aneurysmal occlusive disease is not trivial, and usually requires an open operation.  At 76 years old, and with Markeem's current  weight, this would come with a higher than average morbidity and mortality.    Broadus John, MD [facility] [phone]

## 2022-06-05 ENCOUNTER — Other Ambulatory Visit: Payer: Self-pay

## 2022-06-05 ENCOUNTER — Ambulatory Visit (HOSPITAL_COMMUNITY)
Admission: RE | Admit: 2022-06-05 | Discharge: 2022-06-05 | Disposition: A | Discharge status: Home / Self Care | Payer: Medicare Other | Source: Ambulatory Visit | Attending: Vascular Surgery | Admitting: Vascular Surgery

## 2022-06-05 ENCOUNTER — Encounter: Payer: Self-pay | Admitting: Vascular Surgery

## 2022-06-05 ENCOUNTER — Ambulatory Visit (INDEPENDENT_AMBULATORY_CARE_PROVIDER_SITE_OTHER): Payer: Medicare Other | Admitting: Vascular Surgery

## 2022-06-05 VITALS — BP 161/89 | HR 60 | Temp 98.4°F | Resp 16 | Ht 70.0 in | Wt 216.0 lb

## 2022-06-05 DIAGNOSIS — I7143 Infrarenal abdominal aortic aneurysm, without rupture: Secondary | ICD-10-CM

## 2022-06-05 DIAGNOSIS — I70219 Atherosclerosis of native arteries of extremities with intermittent claudication, unspecified extremity: Secondary | ICD-10-CM | POA: Diagnosis not present

## 2022-06-05 DIAGNOSIS — I723 Aneurysm of iliac artery: Secondary | ICD-10-CM | POA: Diagnosis not present

## 2022-06-05 MED ORDER — LEVOTHYROXINE SODIUM 150 MCG PO TABS: 150.0000 ug | ORAL_TABLET | Freq: Every day | ORAL | 1 refills | Status: DC

## 2022-06-08 DIAGNOSIS — R531 Weakness: Secondary | ICD-10-CM | POA: Diagnosis not present

## 2022-06-08 DIAGNOSIS — M25671 Stiffness of right ankle, not elsewhere classified: Secondary | ICD-10-CM | POA: Diagnosis not present

## 2022-06-08 DIAGNOSIS — M25571 Pain in right ankle and joints of right foot: Secondary | ICD-10-CM | POA: Diagnosis not present

## 2022-06-08 DIAGNOSIS — R262 Difficulty in walking, not elsewhere classified: Secondary | ICD-10-CM | POA: Diagnosis not present

## 2022-06-08 DIAGNOSIS — M25572 Pain in left ankle and joints of left foot: Secondary | ICD-10-CM | POA: Diagnosis not present

## 2022-06-08 DIAGNOSIS — M25672 Stiffness of left ankle, not elsewhere classified: Secondary | ICD-10-CM | POA: Diagnosis not present

## 2022-06-08 DIAGNOSIS — G629 Polyneuropathy, unspecified: Secondary | ICD-10-CM | POA: Diagnosis not present

## 2022-06-10 DIAGNOSIS — R531 Weakness: Secondary | ICD-10-CM | POA: Diagnosis not present

## 2022-06-10 DIAGNOSIS — M25672 Stiffness of left ankle, not elsewhere classified: Secondary | ICD-10-CM | POA: Diagnosis not present

## 2022-06-10 DIAGNOSIS — R262 Difficulty in walking, not elsewhere classified: Secondary | ICD-10-CM | POA: Diagnosis not present

## 2022-06-10 DIAGNOSIS — M25671 Stiffness of right ankle, not elsewhere classified: Secondary | ICD-10-CM | POA: Diagnosis not present

## 2022-06-10 DIAGNOSIS — G629 Polyneuropathy, unspecified: Secondary | ICD-10-CM | POA: Diagnosis not present

## 2022-06-10 DIAGNOSIS — M25571 Pain in right ankle and joints of right foot: Secondary | ICD-10-CM | POA: Diagnosis not present

## 2022-06-10 DIAGNOSIS — M25572 Pain in left ankle and joints of left foot: Secondary | ICD-10-CM | POA: Diagnosis not present

## 2022-06-11 ENCOUNTER — Other Ambulatory Visit: Payer: Self-pay

## 2022-06-11 ENCOUNTER — Ambulatory Visit (INDEPENDENT_AMBULATORY_CARE_PROVIDER_SITE_OTHER): Payer: Medicare Other

## 2022-06-11 DIAGNOSIS — I495 Sick sinus syndrome: Secondary | ICD-10-CM | POA: Diagnosis not present

## 2022-06-11 DIAGNOSIS — I70219 Atherosclerosis of native arteries of extremities with intermittent claudication, unspecified extremity: Secondary | ICD-10-CM

## 2022-06-11 DIAGNOSIS — I723 Aneurysm of iliac artery: Secondary | ICD-10-CM

## 2022-06-12 DIAGNOSIS — M25672 Stiffness of left ankle, not elsewhere classified: Secondary | ICD-10-CM | POA: Diagnosis not present

## 2022-06-12 DIAGNOSIS — M25671 Stiffness of right ankle, not elsewhere classified: Secondary | ICD-10-CM | POA: Diagnosis not present

## 2022-06-12 DIAGNOSIS — M25572 Pain in left ankle and joints of left foot: Secondary | ICD-10-CM | POA: Diagnosis not present

## 2022-06-12 DIAGNOSIS — M25571 Pain in right ankle and joints of right foot: Secondary | ICD-10-CM | POA: Diagnosis not present

## 2022-06-12 DIAGNOSIS — R262 Difficulty in walking, not elsewhere classified: Secondary | ICD-10-CM | POA: Diagnosis not present

## 2022-06-12 DIAGNOSIS — G629 Polyneuropathy, unspecified: Secondary | ICD-10-CM | POA: Diagnosis not present

## 2022-06-12 DIAGNOSIS — R531 Weakness: Secondary | ICD-10-CM | POA: Diagnosis not present

## 2022-06-13 LAB — CUP PACEART REMOTE DEVICE CHECK
Battery Remaining Longevity: 133 mo
Battery Voltage: 3.01 V
Brady Statistic AP VP Percent: 21.3 %
Brady Statistic AP VS Percent: 55.78 %
Brady Statistic AS VP Percent: 0.74 %
Brady Statistic AS VS Percent: 22.16 %
Brady Statistic RA Percent Paced: 71.85 %
Brady Statistic RV Percent Paced: 25.63 %
Date Time Interrogation Session: 20230705202853
Implantable Lead Implant Date: 20210106
Implantable Lead Implant Date: 20210106
Implantable Lead Location: 753859
Implantable Lead Location: 753860
Implantable Lead Model: 3830
Implantable Lead Model: 5076
Implantable Pulse Generator Implant Date: 20210106
Lead Channel Impedance Value: 323 Ohm
Lead Channel Impedance Value: 380 Ohm
Lead Channel Impedance Value: 494 Ohm
Lead Channel Impedance Value: 551 Ohm
Lead Channel Pacing Threshold Amplitude: 0.75 V
Lead Channel Pacing Threshold Amplitude: 0.75 V
Lead Channel Pacing Threshold Pulse Width: 0.4 ms
Lead Channel Pacing Threshold Pulse Width: 0.4 ms
Lead Channel Sensing Intrinsic Amplitude: 1.625 mV
Lead Channel Sensing Intrinsic Amplitude: 1.625 mV
Lead Channel Sensing Intrinsic Amplitude: 24.375 mV
Lead Channel Sensing Intrinsic Amplitude: 24.375 mV
Lead Channel Setting Pacing Amplitude: 1.5 V
Lead Channel Setting Pacing Amplitude: 2.5 V
Lead Channel Setting Pacing Pulse Width: 0.4 ms
Lead Channel Setting Sensing Sensitivity: 2 mV

## 2022-06-15 DIAGNOSIS — M25571 Pain in right ankle and joints of right foot: Secondary | ICD-10-CM | POA: Diagnosis not present

## 2022-06-15 DIAGNOSIS — R531 Weakness: Secondary | ICD-10-CM | POA: Diagnosis not present

## 2022-06-15 DIAGNOSIS — M25671 Stiffness of right ankle, not elsewhere classified: Secondary | ICD-10-CM | POA: Diagnosis not present

## 2022-06-15 DIAGNOSIS — M25672 Stiffness of left ankle, not elsewhere classified: Secondary | ICD-10-CM | POA: Diagnosis not present

## 2022-06-15 DIAGNOSIS — R262 Difficulty in walking, not elsewhere classified: Secondary | ICD-10-CM | POA: Diagnosis not present

## 2022-06-15 DIAGNOSIS — G629 Polyneuropathy, unspecified: Secondary | ICD-10-CM | POA: Diagnosis not present

## 2022-06-15 DIAGNOSIS — M25572 Pain in left ankle and joints of left foot: Secondary | ICD-10-CM | POA: Diagnosis not present

## 2022-06-17 DIAGNOSIS — G629 Polyneuropathy, unspecified: Secondary | ICD-10-CM | POA: Diagnosis not present

## 2022-06-17 DIAGNOSIS — M25672 Stiffness of left ankle, not elsewhere classified: Secondary | ICD-10-CM | POA: Diagnosis not present

## 2022-06-17 DIAGNOSIS — R531 Weakness: Secondary | ICD-10-CM | POA: Diagnosis not present

## 2022-06-17 DIAGNOSIS — R262 Difficulty in walking, not elsewhere classified: Secondary | ICD-10-CM | POA: Diagnosis not present

## 2022-06-17 DIAGNOSIS — M25571 Pain in right ankle and joints of right foot: Secondary | ICD-10-CM | POA: Diagnosis not present

## 2022-06-17 DIAGNOSIS — M25671 Stiffness of right ankle, not elsewhere classified: Secondary | ICD-10-CM | POA: Diagnosis not present

## 2022-06-17 DIAGNOSIS — M25572 Pain in left ankle and joints of left foot: Secondary | ICD-10-CM | POA: Diagnosis not present

## 2022-06-19 DIAGNOSIS — Z1331 Encounter for screening for depression: Secondary | ICD-10-CM | POA: Diagnosis not present

## 2022-06-19 DIAGNOSIS — R809 Proteinuria, unspecified: Secondary | ICD-10-CM | POA: Diagnosis not present

## 2022-06-19 DIAGNOSIS — N1832 Chronic kidney disease, stage 3b: Secondary | ICD-10-CM | POA: Diagnosis not present

## 2022-06-19 DIAGNOSIS — I129 Hypertensive chronic kidney disease with stage 1 through stage 4 chronic kidney disease, or unspecified chronic kidney disease: Secondary | ICD-10-CM | POA: Diagnosis not present

## 2022-06-19 DIAGNOSIS — Z87891 Personal history of nicotine dependence: Secondary | ICD-10-CM | POA: Diagnosis not present

## 2022-06-22 DIAGNOSIS — H04123 Dry eye syndrome of bilateral lacrimal glands: Secondary | ICD-10-CM | POA: Diagnosis not present

## 2022-06-22 DIAGNOSIS — M25572 Pain in left ankle and joints of left foot: Secondary | ICD-10-CM | POA: Diagnosis not present

## 2022-06-22 DIAGNOSIS — G629 Polyneuropathy, unspecified: Secondary | ICD-10-CM | POA: Diagnosis not present

## 2022-06-22 DIAGNOSIS — M25671 Stiffness of right ankle, not elsewhere classified: Secondary | ICD-10-CM | POA: Diagnosis not present

## 2022-06-22 DIAGNOSIS — M25672 Stiffness of left ankle, not elsewhere classified: Secondary | ICD-10-CM | POA: Diagnosis not present

## 2022-06-22 DIAGNOSIS — Z961 Presence of intraocular lens: Secondary | ICD-10-CM | POA: Diagnosis not present

## 2022-06-22 DIAGNOSIS — R531 Weakness: Secondary | ICD-10-CM | POA: Diagnosis not present

## 2022-06-22 DIAGNOSIS — M25571 Pain in right ankle and joints of right foot: Secondary | ICD-10-CM | POA: Diagnosis not present

## 2022-06-22 DIAGNOSIS — R262 Difficulty in walking, not elsewhere classified: Secondary | ICD-10-CM | POA: Diagnosis not present

## 2022-06-23 DIAGNOSIS — N1832 Chronic kidney disease, stage 3b: Secondary | ICD-10-CM | POA: Diagnosis not present

## 2022-06-23 DIAGNOSIS — N189 Chronic kidney disease, unspecified: Secondary | ICD-10-CM | POA: Diagnosis not present

## 2022-06-23 DIAGNOSIS — N200 Calculus of kidney: Secondary | ICD-10-CM | POA: Diagnosis not present

## 2022-06-24 DIAGNOSIS — M25672 Stiffness of left ankle, not elsewhere classified: Secondary | ICD-10-CM | POA: Diagnosis not present

## 2022-06-24 DIAGNOSIS — G629 Polyneuropathy, unspecified: Secondary | ICD-10-CM | POA: Diagnosis not present

## 2022-06-24 DIAGNOSIS — M25572 Pain in left ankle and joints of left foot: Secondary | ICD-10-CM | POA: Diagnosis not present

## 2022-06-24 DIAGNOSIS — M25571 Pain in right ankle and joints of right foot: Secondary | ICD-10-CM | POA: Diagnosis not present

## 2022-06-24 DIAGNOSIS — R262 Difficulty in walking, not elsewhere classified: Secondary | ICD-10-CM | POA: Diagnosis not present

## 2022-06-24 DIAGNOSIS — R531 Weakness: Secondary | ICD-10-CM | POA: Diagnosis not present

## 2022-06-24 DIAGNOSIS — M25671 Stiffness of right ankle, not elsewhere classified: Secondary | ICD-10-CM | POA: Diagnosis not present

## 2022-06-29 ENCOUNTER — Other Ambulatory Visit: Payer: Self-pay | Admitting: Podiatry

## 2022-06-29 DIAGNOSIS — N200 Calculus of kidney: Secondary | ICD-10-CM | POA: Diagnosis not present

## 2022-06-29 DIAGNOSIS — N138 Other obstructive and reflux uropathy: Secondary | ICD-10-CM | POA: Diagnosis not present

## 2022-06-29 DIAGNOSIS — N401 Enlarged prostate with lower urinary tract symptoms: Secondary | ICD-10-CM | POA: Diagnosis not present

## 2022-06-29 DIAGNOSIS — N1832 Chronic kidney disease, stage 3b: Secondary | ICD-10-CM | POA: Diagnosis not present

## 2022-06-29 DIAGNOSIS — Z87442 Personal history of urinary calculi: Secondary | ICD-10-CM | POA: Diagnosis not present

## 2022-06-29 DIAGNOSIS — N529 Male erectile dysfunction, unspecified: Secondary | ICD-10-CM | POA: Diagnosis not present

## 2022-06-29 DIAGNOSIS — R351 Nocturia: Secondary | ICD-10-CM | POA: Diagnosis not present

## 2022-06-30 NOTE — Progress Notes (Signed)
Remote pacemaker transmission.   

## 2022-07-03 IMAGING — CT CT ANGIO AOBIFEM WO/W CM
1 of 15 series · 2 of 16 positions shown, 3 images · non-contrast
Comparison: None.

CLINICAL DATA: Peripheral arterial disease

Lower extremity pain
Lack of blood flow
Blood clots
Inguinal hernia repair
EXAM:
CT ANGIOGRAPHY OF ABDOMINAL AORTA WITH ILIOFEMORAL RUNOFF
TECHNIQUE: Multidetector CT imaging of the abdomen, pelvis and lower
extremities was performed using the standard protocol during bolus
administration of intravenous contrast. Multiplanar CT image
reconstructions and MIPs were obtained to evaluate the vascular
anatomy.

[Series 17: cta delay legs 2.00 bv36 s3 axial 2 mm · axial · delayed · 0.80mm/px · z∈[+445,+1517]mm · 2 of 537 slices shown, 3 images]
[im 1/537  soft-tissue]
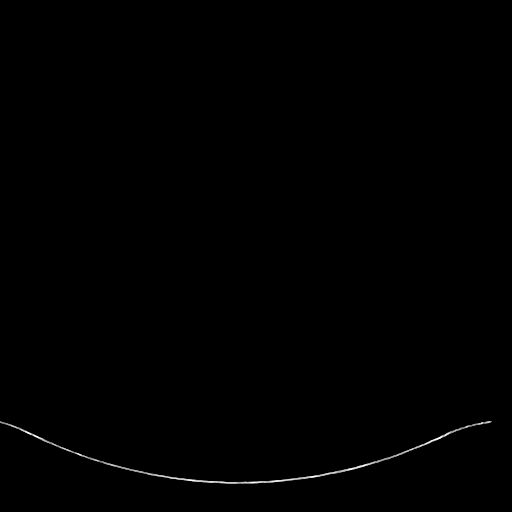
[im 1/537  bone]
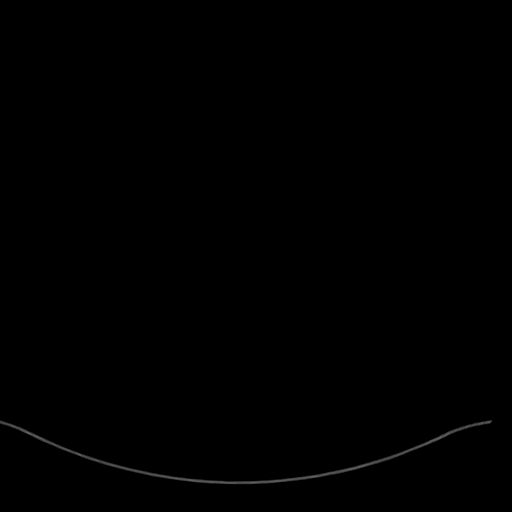
[im 537/537  soft-tissue]
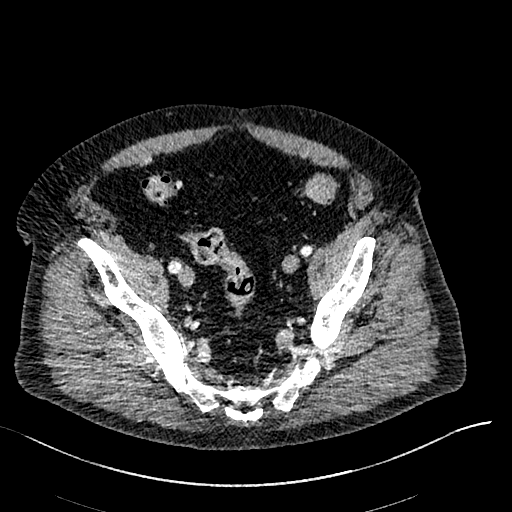

[2 of 16 positions shown; findings below may reference images not displayed]

RADIATION DOSE REDUCTION: This exam was performed according to the
departmental dose-optimization program which includes automated
exposure control, adjustment of the mA and/or kV according to
patient size and/or use of iterative reconstruction technique.

CONTRAST:  80mL PD138T-KMN IOPAMIDOL (PD138T-KMN) INJECTION 76%
FINDINGS: VASCULAR

Aorta: Scattered calcified atheromatous plaque seen throughout the
abdominal aorta. The distal infrarenal abdominal aorta is tortuous
with aneurysmal dilatation measuring up to 3.1 cm.

Celiac: No significant stenosis at the origin. Irregular appearance
of the celiac artery with areas of mild stenosis and dilatation.
Greatest diameter of the celiac artery measures 1.0 cm.

SMA: Patent without evidence of aneurysm, dissection, vasculitis or
significant stenosis.

Renals: Mild to moderate stenosis at the origin of the right main
renal artery. Moderate to severe stenosis of the proximal left main
renal artery with the highest degree of stenosis proximally 1 cm
from the origin.

IMA: Severe stenosis at the origin but otherwise patent.

RIGHT Lower Extremity

Inflow: Irregular linear low-density structures seen in the right
mid common iliac artery likely due to non flow limiting dissections.
Mild stenosis at the origin of the right internal iliac artery. The
proximal external iliac artery is dilated measuring up to 1.3 cm.

Outflow: Minimal calcified atheromatous plaque of the common femoral
and superficial femoral arteries without flow-limiting stenosis. No
significant narrowing of the Profunda femoris or popliteal arteries.

Runoff: Patent three vessel runoff to the ankle.

LEFT Lower Extremity

Inflow: Severe stenosis at the origin of the left common iliac
artery. There is aneurysm of the left common iliac artery measuring
up to 2.1 cm. Mild to moderate stenosis at the origin of the
internal iliac artery which is otherwise patent. Mild stenosis at
the origin of the external iliac artery. There is dilatation of the
external iliac artery measuring up to 1.3 cm.

Outflow: Common, superficial and profunda femoral arteries and the
popliteal artery are patent without evidence of aneurysm,
dissection, vasculitis or significant stenosis.

Runoff: Patent three vessel runoff to the ankle.

Veins: The inferior vena cava is duplicated with the left IVC
draining into the left main renal vein.

Review of the MIP images confirms the above findings.

NON-VASCULAR

Lower chest: No acute abnormality.

Hepatobiliary: Subcentimeter hepatic hypodensities are too small to
fully characterize. Liver otherwise unremarkable. Moderate
cholelithiasis.

Pancreas: Unremarkable. No pancreatic ductal dilatation or
surrounding inflammatory changes.

Spleen: Normal in size without focal abnormality.

Adrenals/Urinary Tract: Adrenal glands are normal. Kidneys are
mildly atrophied. No significant abnormality of the ureters or
bladder.

Stomach/Bowel: Sigmoid diverticulosis. Normal appendix. No bowel
dilatation to indicate ileus or obstruction.

Lymphatic: No enlarged abdominopelvic lymph nodes.

Reproductive: Prostate is mildly enlarged.

Other: No abdominal wall hernia or abnormality. No abdominopelvic
ascites.

Musculoskeletal: No acute or significant osseous findings.
IMPRESSION: VASCULAR

1. Aneurysm of the infrarenal abdominal aorta measuring up to
cm. Recommend follow-up every 3 years. This recommendation follows
ACR consensus guidelines: White Paper of the ACR Incidental Findings
Committee II on Vascular Findings. [HOSPITAL] 8334;
[DATE].
2. Irregular appearance of the celiac artery with areas of mild
stenosis and dilatation measuring up to 1.0 cm.
3. Irregular appearance of the right common iliac artery with
multiple linear low-density structures suspicious for non flow
limiting dissections. Dilated the proximal external iliac artery
measuring up to 1.3 cm.
4. Severe stenosis of the origin of the left common iliac artery
with post-stenotic aneurysm measuring up to 2.1 cm. Dilated left
external iliac artery measuring up to 1.3 cm.
5. Pattern of vascular irregularity in aneurysm is suspicious for
collagen vascular disease.

NON-VASCULAR

1. No acute abnormality of the abdomen or pelvis.
2. Sigmoid diverticulosis.

## 2022-07-13 ENCOUNTER — Other Ambulatory Visit: Payer: Self-pay | Admitting: Cardiology

## 2022-07-13 DIAGNOSIS — I480 Paroxysmal atrial fibrillation: Secondary | ICD-10-CM

## 2022-07-13 NOTE — Telephone Encounter (Addendum)
Xarelto '15mg'$  refill request received. Pt is 76 years old, weight-98kg, Crea-1.53 on 04/14/2022, last seen by Dr. Fletcher Anon on 02/17/2022, Diagnosis-Afib, CrCl-56.55m/min; Dose is INAPPROPRIATE based on dosing criteria. The last refill was sent on 05/11/2022 with '15mg'$  dose which is inappropriate dose. Initial dose of '20mg'$  was sent 02/17/2022 as CrCl was 52.61mmin & current CrCl-56.0565min. Will discuss with pharm D regarding dose.    Spoke with [facility] pharmacy as well and they stated they are unsure of the duplicate request but the pt is due to pick up the Xarelto '15mg'$  refill anytime. They do not need another Xarelto refill as the pt has 2 more on file. However, need to inquire about the dose.

## 2022-07-15 ENCOUNTER — Ambulatory Visit (INDEPENDENT_AMBULATORY_CARE_PROVIDER_SITE_OTHER): Payer: Medicare Other | Admitting: Internal Medicine

## 2022-07-15 ENCOUNTER — Encounter: Payer: Self-pay | Admitting: Internal Medicine

## 2022-07-15 VITALS — BP 134/80 | HR 60 | Temp 98.0°F | Resp 16 | Ht 70.0 in | Wt 212.0 lb

## 2022-07-15 DIAGNOSIS — D509 Iron deficiency anemia, unspecified: Secondary | ICD-10-CM | POA: Diagnosis not present

## 2022-07-15 DIAGNOSIS — I723 Aneurysm of iliac artery: Secondary | ICD-10-CM | POA: Diagnosis not present

## 2022-07-15 DIAGNOSIS — N1832 Chronic kidney disease, stage 3b: Secondary | ICD-10-CM | POA: Diagnosis not present

## 2022-07-15 DIAGNOSIS — G629 Polyneuropathy, unspecified: Secondary | ICD-10-CM

## 2022-07-15 DIAGNOSIS — I70219 Atherosclerosis of native arteries of extremities with intermittent claudication, unspecified extremity: Secondary | ICD-10-CM

## 2022-07-15 DIAGNOSIS — I480 Paroxysmal atrial fibrillation: Secondary | ICD-10-CM

## 2022-07-15 NOTE — Progress Notes (Signed)
Subjective:    Patient ID: Martin Rubio, male    DOB: 1946-11-27, 76 y.o.   MRN: [id number]  DOS:  07/15/2022 Type of visit - description: f/u  Since the last visit, saw nephrology and vascular surgery.  Notes reviewed. Neuropathy is ongoing. Not taking gabapentin regularly  Review of Systems See above   Past Medical History:  Diagnosis Date   Bronchitis    Diverticulosis    FH: BPH (benign prostatic hypertrophy)    History of Graves' disease 01/19/2018   S/p ablation   HTN (hypertension)    Hyperlipidemia    Obesity    Psoriatic arthritis (HCC)    Rheumatoid arthritis ([place]) 08/07/2014   Sleep apnea    CPAP   Thyroid disease     Past Surgical History:  Procedure Laterality Date   ABDOMINAL AORTOGRAM W/LOWER EXTREMITY N/A 01/14/2022   Procedure: ABDOMINAL AORTOGRAM W/LOWER EXTREMITY;  Surgeon: Wellington Hampshire, MD;  Location: [facility];  Service: Cardiovascular;  Laterality: N/A;   INGUINAL HERNIA REPAIR     bilateral   KNEE SURGERY     bilateral arthroscopic   l foot surgery Left 12/2020   PACEMAKER IMPLANT N/A 12/13/2019   Procedure: PACEMAKER IMPLANT;  Surgeon: Evans Lance, MD;  Location: [facility];  Service: Cardiovascular;  Laterality: N/A;   TRANSURETHRAL RESECTION OF PROSTATE     UMBILICAL HERNIA REPAIR      Current Outpatient Medications  Medication Instructions   acetaminophen (TYLENOL) 650 mg, Oral, Every 6 hours PRN   amLODipine (NORVASC) 5 mg, Oral, Daily   Ascorbic Acid (VITAMIN C ADULT GUMMIES PO) 1 each, Oral, Daily   cycloSPORINE (RESTASIS) 0.05 % ophthalmic emulsion 1 drop, Both Eyes, 2 times daily   diclofenac Sodium (VOLTAREN) 4 g, Topical, 2 times daily   dofetilide (TIKOSYN) 250 MCG capsule TAKE ONE CAPSULE BY MOUTH TWICE A DAY   Enbrel 50 mg, Subcutaneous, Weekly   ferrous fumarate (HEMOCYTE - 106 MG FE) 325 (106 Fe) MG TABS tablet 106 mg of iron, Oral, 2 times daily   fluticasone (FLONASE) 50 MCG/ACT nasal spray 1  spray, Each Nare, As needed   gabapentin (NEURONTIN) 100 mg, Oral, 3 times daily PRN   hydrOXYzine (VISTARIL) 25 MG capsule TAKE ONE CAPSULE BY MOUTH EVERY 8 HOURS AS NEEDED   Krill Oil 350 mg, Oral, Daily   levothyroxine (SYNTHROID) 150 mcg, Oral, Daily before breakfast   losartan (COZAAR) 50 mg, Oral, 2 times daily   MAGNESIUM GLYCINATE PO 1 tablet, Daily   meclizine (ANTIVERT) 25 mg, Oral, 3 times daily PRN   melatonin 5 mg, Oral, Daily at bedtime   omeprazole (PRILOSEC) 20 mg, Oral, Daily, Buys the [employer] brand.    Praluent 150 mg, Subcutaneous, Every 14 days   pregabalin (LYRICA) 50 mg, Oral, Daily   tadalafil (PAH) (ADCIRCA) 20 mg, Oral, Daily PRN   tamsulosin (FLOMAX) 0.4 mg, Oral, Daily at bedtime   triamcinolone cream (KENALOG) 0.1 % 1 application , Topical, Daily PRN   Vitamin B 12 1,000 mcg, Oral, Daily   Vitamin D 5,000 Units, Daily   XARELTO 15 MG TABS tablet TAKE ONE TABLET BY MOUTH ONE TIME DAILY WITH A FULL MEAL       Objective:   Physical Exam BP 134/80   Pulse 60   Temp 98 F (36.7 C) (Oral)   Resp 16   Ht '5\' 10"'$  (1.778 m)   Wt 212 lb (96.2 kg)   SpO2  97%   BMI 30.42 kg/m  General:   Well developed, NAD, BMI noted. HEENT:  Normocephalic . Face symmetric, atraumatic Lungs:  CTA B Normal respiratory effort, no intercostal retractions, no accessory muscle use. Heart: RRR,  no murmur.  Lower extremities: no pretibial edema bilaterally.  Pedal pulses present.  Pinprick examination: Was able to feel the monofilament in both plantar areas Skin: Not pale. Not jaundice Neurologic:  alert & oriented X3.  Speech normal, gait appropriate for age and unassisted Psych--  Cognition and judgment appear intact.  Cooperative with normal attention span and concentration.  Behavior appropriate. No anxious or depressed appearing.      Assessment     ASSESSMENT  (transfer to me 12/2019) Hyperglycemia HTN High cholesterol, seen at the lipid clinic Hypothyroidism   Psoriatic Arthritis [name]  Osteoporosis: on fosamax rx by PCP, start holiday by mid 2022 (see visit note from 02/29/2020) CKD mild,stable . Creatinine ~1.5 CV: -Paroxysmal A. Fib, dx 09/2019 anticoagulated -Sick sinus syndrome, s/p pacemaker (12/13/2019) -Septal hypertrophy per echo -Left common iliac aneurysm, AAA  - PVD OSA on CPAP BPH - sees urology HOH  Iron deficiency anemia: 12/02/2021 >> EGD (several polyps suggestive of FGP), C-scope:no polyps, no cancer. Pathology: benign   PLAN HTN: BP is well-controlled, continue present care CKD 3 Saw nephrology 06/19/2022, Dx CKD stage III, estimated GFR 44. They Rx a renal sonogram and they will try to quantify proteinuria Neuropathy: This is his main concern today, has a burning feeling at the L>R plantar area, currently on Lyrica 50 mg qhs ( RX by Dr. Linton Rump, orthopedics at [facility]) it is helping.   I Rx gabapentin the last time, he took it few times but caused somnolence.  He is only taking it as needed.  I advised that we could increase Lyrica dose and delete gabapentin but he prefers his current strategy. Offered referral to neurology, declined it, will think about it. He sees podiatry regularly for footcare. PVD, paroxysmal A-fib, aneurysms abdominal aorta: Saw vascular surgery 06/05/2022, has a 3.1 cm infrarenal abdominal aneurysm (small), + stenosis L common iliac artery.  They recommend aneurysm repair if size increases, otherwise conservative treatment, exercise program. Iron deficiency anemia: I saw a message from Dr. Fayne Mediate nurse. "SBCE looks ok, f/u Primary Care Physcian for iron replacement and hematology referral"  Last iron levels better, on iron supplements, last CBC essentially normal. Hold hematology referral. Preventive care: Vaccine advice provided RTC 4 months  Time spent 32 minutes, extensive chart review, discussed treatment of neuropathy.

## 2022-07-15 NOTE — Assessment & Plan Note (Signed)
HTN: BP is well-controlled, continue present care CKD 3 Saw nephrology 06/19/2022, Dx CKD stage III, estimated GFR 44. They Rx a renal sonogram and they will try to quantify proteinuria Neuropathy: This is his main concern today, has a burning feeling at the L>R plantar area, currently on Lyrica 50 mg qhs ( RX by Dr. Linton Rump, orthopedics at [facility]) it is helping.   I Rx gabapentin the last time, he took it few times but caused somnolence.  He is only taking it as needed.  I advised that we could increase Lyrica dose and delete gabapentin but he prefers his current strategy. Offered referral to neurology, declined it, will think about it. He sees podiatry regularly for footcare. PVD, paroxysmal A-fib, aneurysms abdominal aorta: Saw vascular surgery 06/05/2022, has a 3.1 cm infrarenal abdominal aneurysm (small), + stenosis L common iliac artery.  They recommend aneurysm repair if size increases, otherwise conservative treatment, exercise program. Iron deficiency anemia: I saw a message from Dr. Fayne Mediate nurse. "SBCE looks ok, f/u Primary Care Physcian for iron replacement and hematology referral"  Last iron levels better, on iron supplements, last CBC essentially normal. Hold hematology referral. Preventive care: Vaccine advice provided RTC 4 months

## 2022-07-15 NOTE — Patient Instructions (Addendum)
Recommend to proceed with the following vaccines at your pharmacy:  Covid booster (bivalent) Shingrix (shingles) #2 Flu shot this fall  If you decide to be referred to a neurologist regarding neuropathy please let me know  Read the information about feet care below     Standard, [place] back for   a checkup in 4 months

## 2022-07-16 NOTE — Telephone Encounter (Signed)
Called pt in reference to an update from PharmD. There was no answer so left a message stating to call back.

## 2022-07-17 NOTE — Telephone Encounter (Signed)
Called pt in reference to appropriate Xarelto dose. No answer. Left voicemail.

## 2022-08-31 ENCOUNTER — Ambulatory Visit (INDEPENDENT_AMBULATORY_CARE_PROVIDER_SITE_OTHER): Payer: Medicare Other | Admitting: Podiatry

## 2022-08-31 DIAGNOSIS — M79675 Pain in left toe(s): Secondary | ICD-10-CM

## 2022-08-31 DIAGNOSIS — G629 Polyneuropathy, unspecified: Secondary | ICD-10-CM | POA: Diagnosis not present

## 2022-08-31 DIAGNOSIS — B351 Tinea unguium: Secondary | ICD-10-CM | POA: Diagnosis not present

## 2022-08-31 DIAGNOSIS — M79674 Pain in right toe(s): Secondary | ICD-10-CM

## 2022-08-31 NOTE — Progress Notes (Signed)
   Chief Complaint  Patient presents with   Callouses    Patient is here for left foot callus causing pain and hx of neuropathy and having alot of burning    SUBJECTIVE Patient presents to office today complaining of elongated, thickened nails that cause pain while ambulating in shoes.  Patient is unable to trim their own nails.   Patient also presents today to discuss his neuropathy to the bilateral lower extremities.  Patient states that he has burning tingling numbness and calluses to the feet.  He has taken both Lyrica and gabapentin in the past with minimal improvement.  Currently taking Lyrica 50 mg daily.  He also applies topical Voltaren gel daily.  Patient is here for further evaluation and treatment.  Past Medical History:  Diagnosis Date   Bronchitis    Diverticulosis    FH: BPH (benign prostatic hypertrophy)    History of Graves' disease 01/19/2018   S/p ablation   HTN (hypertension)    Hyperlipidemia    Obesity    Psoriatic arthritis ([place])    Rheumatoid arthritis ([place]) 08/07/2014   Sleep apnea    CPAP   Thyroid disease     OBJECTIVE General Patient is awake, alert, and oriented x 3 and in no acute distress. Derm Skin is dry and supple bilateral. Negative open lesions or macerations. Remaining integument unremarkable. Nails are tender, long, thickened and dystrophic with subungual debris, consistent with onychomycosis, 1-5 bilateral. No signs of infection noted. Vasc  DP and PT pedal pulses palpable bilaterally. Temperature gradient within normal limits.  Neuro Epicritic and protective threshold sensation diminished Musculoskeletal Exam No symptomatic pedal deformities noted bilateral. Muscular strength within normal limits.  ASSESSMENT 1.  Pain due to onychomycosis of toenails both 2.  Peripheral polyneuropathy bilateral lower extremities  PLAN OF CARE 1. Patient evaluated today.  2. Instructed to maintain good pedal hygiene and foot care.  3. Mechanical  debridement of nails 1-5 bilaterally performed using a nail nipper. Filed with dremel without incident.  4.  In regards to the chronic peripheral polyneuropathy, continue Lyrica 50 mg daily.  Patient may want to increase the dosage.  Recommend discussing with his PCP  5.  Continue topical Voltaren 1% gel 2 times daily as needed  6.  Recommended  Metanx vitamin supplement  7.  Return to clinic in 3 mos.    Edrick Kins, DPM [facility]  Dr. Edrick Kins, DPM    [address], [place] [address]                Office [phone]  Fax [phone]

## 2022-09-01 ENCOUNTER — Ambulatory Visit: Payer: Medicare Other | Attending: Cardiovascular Disease | Admitting: Cardiovascular Disease

## 2022-09-01 ENCOUNTER — Encounter: Payer: Self-pay | Admitting: Cardiovascular Disease

## 2022-09-01 VITALS — BP 126/76 | HR 68 | Ht 70.0 in | Wt 213.8 lb

## 2022-09-01 DIAGNOSIS — I10 Essential (primary) hypertension: Secondary | ICD-10-CM | POA: Insufficient documentation

## 2022-09-01 DIAGNOSIS — Z09 Encounter for follow-up examination after completed treatment for conditions other than malignant neoplasm: Secondary | ICD-10-CM | POA: Insufficient documentation

## 2022-09-01 DIAGNOSIS — E785 Hyperlipidemia, unspecified: Secondary | ICD-10-CM | POA: Insufficient documentation

## 2022-09-01 DIAGNOSIS — I70219 Atherosclerosis of native arteries of extremities with intermittent claudication, unspecified extremity: Secondary | ICD-10-CM

## 2022-09-01 DIAGNOSIS — I739 Peripheral vascular disease, unspecified: Secondary | ICD-10-CM | POA: Insufficient documentation

## 2022-09-01 NOTE — Progress Notes (Signed)
Cardiology Office Note   Date:  09/01/2022   ID:  Martin Rubio, DOB 1946/02/21, MRN [id number]  PCP:  Colon Branch, MD  Cardiologist: Dr. [name]  No chief complaint on file.      History of Present Illness: Martin Rubio is a 76 y.o. male who is here today for a follow-up visit regarding peripheral arterial disease and iliac artery aneurysms.   He has known history of essential hypertension, paroxysmal atrial fibrillation/flutter, bradycardia status post pacemaker placement, psoriatic arthritis on Enbrel, carotid disease and hyperlipidemia.  He has history of remote smoking but quit more than 45 years ago.  He is not diabetic. The patient has been followed for iliac aneurysms.  He has known history of iliac aneurysm for many years since at least 2016.  At that time, the size was 1.7 cm in diameter.   Most recent duplex in October 2022 showed that the aneurysm in the right common iliac artery measured 2.1 cm with significant stenosis and velocity of 425.  On the left side, the aneurysm measured 2.3 cm with significant stenosis in the proximal left common iliac artery with peak velocity of 500.  ABI in December 2021 was normal on the right and mildly reduced on the left at 0.85.  No significant infrainguinal disease was noted by duplex.  Angiography was done in February of this year and showed small to medium size aortic aneurysm above the iliac bifurcation, severe occlusive disease affecting bilateral common iliac artery worse on the left side with large sized aneurysm on the left side and medium size on the right side with very tortuous iliac arteries.  Endovascular options were limited .  The patient was seen by Dr. Virl Cagey for consultation.  Given that his claudication was not severe and aneurysm size was below the threshold for repair, it was elected to continue medical therapy and observation for now.  He reports stable claudication overall. He did feel that he was in A-fib  recently but he is noted to be back in sinus rhythm today on EKG.   Past Medical History:  Diagnosis Date   Bronchitis    Diverticulosis    FH: BPH (benign prostatic hypertrophy)    History of Graves' disease 01/19/2018   S/p ablation   HTN (hypertension)    Hyperlipidemia    Obesity    Psoriatic arthritis ([place])    Rheumatoid arthritis ([place]) 08/07/2014   Sleep apnea    CPAP   Thyroid disease     Past Surgical History:  Procedure Laterality Date   ABDOMINAL AORTOGRAM W/LOWER EXTREMITY N/A 01/14/2022   Procedure: ABDOMINAL AORTOGRAM W/LOWER EXTREMITY;  Surgeon: Wellington Hampshire, MD;  Location: [facility];  Service: Cardiovascular;  Laterality: N/A;   INGUINAL HERNIA REPAIR     bilateral   KNEE SURGERY     bilateral arthroscopic   l foot surgery Left 12/2020   PACEMAKER IMPLANT N/A 12/13/2019   Procedure: PACEMAKER IMPLANT;  Surgeon: Evans Lance, MD;  Location: [facility];  Service: Cardiovascular;  Laterality: N/A;   TRANSURETHRAL RESECTION OF PROSTATE     UMBILICAL HERNIA REPAIR       Current Outpatient Medications  Medication Sig Dispense Refill   acetaminophen (TYLENOL) 325 MG tablet Take 650 mg by mouth every 6 (six) hours as needed for mild pain.     Alirocumab (PRALUENT) 150 MG/ML SOAJ Inject 150 mg into the skin every 14 (fourteen) days. 2 mL 11  amLODipine (NORVASC) 5 MG tablet Take 1 tablet (5 mg total) by mouth daily. 90 tablet 3   Ascorbic Acid (VITAMIN C ADULT GUMMIES PO) Take 1 each by mouth daily.     Cholecalciferol (VITAMIN D) 125 MCG (5000 UT) CAPS Take 5,000 Units by mouth daily.     Cyanocobalamin (VITAMIN B 12) 500 MCG TABS Take 1,000 mcg by mouth daily.     cycloSPORINE (RESTASIS) 0.05 % ophthalmic emulsion Place 1 drop into both eyes 2 (two) times daily.     diclofenac Sodium (VOLTAREN) 1 % GEL Apply 4 g topically in the morning and at bedtime. 100 g 6   dofetilide (TIKOSYN) 250 MCG capsule TAKE ONE CAPSULE BY MOUTH TWICE A DAY 180  capsule 2   etanercept (ENBREL) 50 MG/ML injection Inject 50 mg into the skin once a week.     ferrous fumarate (HEMOCYTE - 106 MG FE) 325 (106 Fe) MG TABS tablet Take 1 tablet (106 mg of iron total) by mouth 2 (two) times daily. 180 tablet 1   fluticasone (FLONASE) 50 MCG/ACT nasal spray Place 1 spray into both nostrils as needed for allergies.     gabapentin (NEURONTIN) 100 MG capsule Take 1 capsule (100 mg total) by mouth 3 (three) times daily as needed.     hydrOXYzine (VISTARIL) 25 MG capsule TAKE ONE CAPSULE BY MOUTH EVERY 8 HOURS AS NEEDED 60 capsule 1   Krill Oil 350 MG CAPS Take 350 mg by mouth daily.     levothyroxine (SYNTHROID) 150 MCG tablet Take 1 tablet (150 mcg total) by mouth daily before breakfast. 90 tablet 1   losartan (COZAAR) 50 MG tablet TAKE 1 TABLET (50 MG TOTAL) BY MOUTH IN THE MORNING AND AT BEDTIME. 180 tablet 3   MAGNESIUM GLYCINATE PO Take 1 tablet by mouth daily.     meclizine (ANTIVERT) 25 MG tablet Take 25 mg by mouth 3 (three) times daily as needed for dizziness.     Melatonin 5 MG TABS Take 5 mg by mouth at bedtime.     omeprazole (PRILOSEC) 20 MG capsule Take 20 mg by mouth daily. Buys the [employer] brand.     pregabalin (LYRICA) 50 MG capsule Take 50 mg by mouth daily.     tadalafil, PAH, (ADCIRCA) 20 MG tablet Take 20 mg by mouth daily as needed.     tamsulosin (FLOMAX) 0.4 MG CAPS capsule Take 0.4 mg by mouth at bedtime.     triamcinolone cream (KENALOG) 0.1 % Apply 1 application topically daily as needed for itching.     XARELTO 15 MG TABS tablet TAKE ONE TABLET BY MOUTH ONE TIME DAILY WITH A FULL MEAL 90 tablet 1   No current facility-administered medications for this visit.    Allergies:   Codeine, Nsaids, Prednisone, Statins, and Sulfa antibiotics    Social History:  The patient  reports that he has quit smoking. His smoking use included cigarettes. He has never used smokeless tobacco. He reports that he does not drink alcohol and does not use drugs.    Family History:  The patient's family history includes CAD (age of onset: 52) in his mother; CAD (age of onset: 14) in his father; Diabetes in his brother.    ROS:  Please see the history of present illness.   Otherwise, review of systems are positive for none.   All other systems are reviewed and negative.    PHYSICAL EXAM: VS:  BP 126/76   Pulse 68   Ht  $'5\' 10"'t$  (1.778 m)   Wt 213 lb 12.8 oz (97 kg)   SpO2 96%   BMI 30.68 kg/m  , BMI Body mass index is 30.68 kg/m. GEN: Well nourished, well developed, in no acute distress  HEENT: normal  Neck: no JVD, carotid bruits, or masses Cardiac: RRR; no murmurs, rubs, or gallops,no edema  Respiratory:  clear to auscultation bilaterally, normal work of breathing GI: soft, nontender, nondistended, + BS MS: no deformity or atrophy  Skin: warm and dry, no rash Neuro:  Strength and sensation are intact Psych: euthymic mood, full affect     EKG:  EKG is ordered today. EKG showed sinus rhythm with first-degree AV block, moderate LVH with repolarization abnormalities.   Recent Labs: 04/14/2022: BUN 21; Creatinine, Ser 1.53; Hemoglobin 15.6; Platelets 150.0; Potassium 4.4; Sodium 141; TSH 1.24    Lipid Panel    Component Value Date/Time   CHOL 249 (H) 02/06/2022 0956   TRIG 215 (H) 02/06/2022 0956   HDL 38 (L) 02/06/2022 0956   CHOLHDL 6.6 (H) 02/06/2022 0956   CHOLHDL 6 08/08/2021 1002   VLDL 28.8 08/08/2021 1002   LDLCALC 171 (H) 02/06/2022 0956      Wt Readings from Last 3 Encounters:  09/01/22 213 lb 12.8 oz (97 kg)  07/15/22 212 lb (96.2 kg)  06/05/22 216 lb (98 kg)            No data to display            ASSESSMENT AND PLAN:  1.  Peripheral arterial disease with bilateral common iliac artery aneurysm and severe occlusive disease: He continues to have claudication but symptoms seem to be stable overall.  I suggested that he continues his follow-up with Dr.Robins as his disease is not amenable to endovascular  intervention.  2.  Paroxysmal atrial fibrillation on dofetilide: Maintaining sinus rhythm.  He is on long-term anticoagulation with Eliquis.   3. Essential hypertension: Blood pressure is well controlled on current medications.  4.  Hyperlipidemia: Currently on Praluent.    Disposition: Continue regular follow-up with Dr. Virl Cagey for his aortic and iliac aneurysms.  No need for follow-up with me.  Cardiac issues are followed by Dr. [name].  Signed,  Kathlyn Sacramento, MD  09/01/2022 11:20 AM    [place]

## 2022-09-01 NOTE — Patient Instructions (Addendum)
Medication Instructions:  Your physician recommends that you continue on your current medications as directed. Please refer to the Current Medication list given to you today.  *If you need a refill on your cardiac medications before your next appointment, please call your pharmacy*   Lab Work: None   Testing/Procedures: None   Follow-Up: At [facility], you and your health needs are our priority.  As part of our continuing mission to provide you with exceptional heart care, we have created designated Provider Care Teams.  These Care Teams include your primary Cardiologist (physician) and Advanced Practice Providers (APPs -  Physician Assistants and Nurse Practitioners) who all work together to provide you with the care you need, when you need it.  We recommend signing up for the patient portal called "MyChart".  Sign up information is provided on this After Visit Summary.  MyChart is used to connect with patients for Virtual Visits (Telemedicine).  Patients are able to view lab/test results, encounter notes, upcoming appointments, etc.  Non-urgent messages can be sent to your provider as well.   To learn more about what you can do with MyChart, go to [email or web address].    Your next appointment:   As needed  The format for your next appointment:   In Person  Provider:   Kathlyn Sacramento, MD   Other Instructions Please keep follow up appointment with Dr. Virl Cagey in March 2024.   Important Information About Sugar

## 2022-09-03 DIAGNOSIS — L820 Inflamed seborrheic keratosis: Secondary | ICD-10-CM | POA: Diagnosis not present

## 2022-09-03 DIAGNOSIS — Z85828 Personal history of other malignant neoplasm of skin: Secondary | ICD-10-CM | POA: Diagnosis not present

## 2022-09-03 DIAGNOSIS — D1801 Hemangioma of skin and subcutaneous tissue: Secondary | ICD-10-CM | POA: Diagnosis not present

## 2022-09-03 DIAGNOSIS — E669 Obesity, unspecified: Secondary | ICD-10-CM | POA: Diagnosis not present

## 2022-09-03 DIAGNOSIS — D225 Melanocytic nevi of trunk: Secondary | ICD-10-CM | POA: Diagnosis not present

## 2022-09-03 DIAGNOSIS — L821 Other seborrheic keratosis: Secondary | ICD-10-CM | POA: Diagnosis not present

## 2022-09-03 DIAGNOSIS — D3610 Benign neoplasm of peripheral nerves and autonomic nervous system, unspecified: Secondary | ICD-10-CM | POA: Diagnosis not present

## 2022-09-03 DIAGNOSIS — L570 Actinic keratosis: Secondary | ICD-10-CM | POA: Diagnosis not present

## 2022-09-04 NOTE — Telephone Encounter (Signed)
Spoke with Gerald Stabs, PharmD, and he advised to send in Xarelto '20mg'$  refill and place note to call regarding dose change.

## 2022-09-10 ENCOUNTER — Ambulatory Visit (INDEPENDENT_AMBULATORY_CARE_PROVIDER_SITE_OTHER): Payer: Medicare Other

## 2022-09-10 DIAGNOSIS — I495 Sick sinus syndrome: Secondary | ICD-10-CM | POA: Diagnosis not present

## 2022-09-10 LAB — CUP PACEART REMOTE DEVICE CHECK
Battery Remaining Longevity: 127 mo
Battery Voltage: 3.01 V
Brady Statistic AP VP Percent: 26.92 %
Brady Statistic AP VS Percent: 48.48 %
Brady Statistic AS VP Percent: 0.9 %
Brady Statistic AS VS Percent: 23.68 %
Brady Statistic RA Percent Paced: 62.51 %
Brady Statistic RV Percent Paced: 35.41 %
Date Time Interrogation Session: 20231004220759
Implantable Lead Implant Date: 20210106
Implantable Lead Implant Date: 20210106
Implantable Lead Location: 753859
Implantable Lead Location: 753860
Implantable Lead Model: 3830
Implantable Lead Model: 5076
Implantable Pulse Generator Implant Date: 20210106
Lead Channel Impedance Value: 285 Ohm
Lead Channel Impedance Value: 361 Ohm
Lead Channel Impedance Value: 380 Ohm
Lead Channel Impedance Value: 551 Ohm
Lead Channel Pacing Threshold Amplitude: 0.625 V
Lead Channel Pacing Threshold Amplitude: 0.625 V
Lead Channel Pacing Threshold Pulse Width: 0.4 ms
Lead Channel Pacing Threshold Pulse Width: 0.4 ms
Lead Channel Sensing Intrinsic Amplitude: 2.25 mV
Lead Channel Sensing Intrinsic Amplitude: 2.25 mV
Lead Channel Sensing Intrinsic Amplitude: 26.875 mV
Lead Channel Sensing Intrinsic Amplitude: 26.875 mV
Lead Channel Setting Pacing Amplitude: 1.5 V
Lead Channel Setting Pacing Amplitude: 2.5 V
Lead Channel Setting Pacing Pulse Width: 0.4 ms
Lead Channel Setting Sensing Sensitivity: 2 mV

## 2022-09-10 NOTE — Progress Notes (Signed)
Cardiology Office Note   Date:  09/11/2022   ID:  Martin, Rubio September 30, 1946, MRN [id number]  PCP:  Colon Branch, MD  Cardiologist:   Minus Breeding, MD   Chief Complaint  Patient presents with   Atrial Fibrillation       History of Present Illness: Martin Rubio is a 76 y.o. male who presents for follow up of HTN.  His echo in Nov of 2016 demonstrated no septal hypertrophy.  He has had atrial fib/flutter paroxysmally with slow ventricular rates.  He had pacemaker placement.   He continued to have PAF and was started on Tikosyn.   Angiography showed small to medium size aortic aneurysm above the iliac bifurcation, severe occlusive disease affecting bilateral common iliac artery worse on the left side with large sized aneurysm on the left side and medium size on the right side with very tortuous iliac arteries.  Endovascular options were limited .  The patient was seen by Dr. Virl Cagey for consultation.  Given that his claudication was not severe and aneurysm size was below the threshold for repair, it was elected to continue medical therapy and observation for now.    Since I last saw him he has continued to have palpitations.  He knows that he is in atrial fibrillation.  He just feels very tired and dizzy on those days.  I went back and looked at his device interrogation and he does spend entire days in fibrillation.  He goes to the gym 3 times a week.  He is not describing any new chest pressure, neck or arm discomfort.  He is not having any new shortness of breath, PND or orthopnea.  He has had some mild slow weight gain.  He has some mild lower extremity edema.  Past Medical History:  Diagnosis Date   Bronchitis    Diverticulosis    FH: BPH (benign prostatic hypertrophy)    History of Graves' disease 01/19/2018   S/p ablation   HTN (hypertension)    Hyperlipidemia    Obesity    Psoriatic arthritis ([place])    Rheumatoid arthritis ([place]) 08/07/2014   Sleep apnea    CPAP    Thyroid disease     Past Surgical History:  Procedure Laterality Date   ABDOMINAL AORTOGRAM W/LOWER EXTREMITY N/A 01/14/2022   Procedure: ABDOMINAL AORTOGRAM W/LOWER EXTREMITY;  Surgeon: Wellington Hampshire, MD;  Location: [facility];  Service: Cardiovascular;  Laterality: N/A;   INGUINAL HERNIA REPAIR     bilateral   KNEE SURGERY     bilateral arthroscopic   l foot surgery Left 12/2020   PACEMAKER IMPLANT N/A 12/13/2019   Procedure: PACEMAKER IMPLANT;  Surgeon: Evans Lance, MD;  Location: [facility];  Service: Cardiovascular;  Laterality: N/A;   TRANSURETHRAL RESECTION OF PROSTATE     UMBILICAL HERNIA REPAIR       Current Outpatient Medications  Medication Sig Dispense Refill   acetaminophen (TYLENOL) 325 MG tablet Take 650 mg by mouth every 6 (six) hours as needed for mild pain.     Alirocumab (PRALUENT) 150 MG/ML SOAJ Inject 150 mg into the skin every 14 (fourteen) days. 2 mL 11   amLODipine (NORVASC) 5 MG tablet Take 1 tablet (5 mg total) by mouth daily. 90 tablet 3   Cholecalciferol (VITAMIN D) 125 MCG (5000 UT) CAPS Take 5,000 Units by mouth daily.     Cyanocobalamin (VITAMIN B 12) 500 MCG TABS Take 1,000  mcg by mouth daily.     diclofenac Sodium (VOLTAREN) 1 % GEL Apply 4 g topically in the morning and at bedtime. 100 g 6   dofetilide (TIKOSYN) 250 MCG capsule TAKE ONE CAPSULE BY MOUTH TWICE A DAY 180 capsule 2   etanercept (ENBREL) 50 MG/ML injection Inject 50 mg into the skin once a week.     ferrous fumarate (HEMOCYTE - 106 MG FE) 325 (106 Fe) MG TABS tablet Take 1 tablet (106 mg of iron total) by mouth 2 (two) times daily. 180 tablet 1   fluticasone (FLONASE) 50 MCG/ACT nasal spray Place 1 spray into both nostrils as needed for allergies.     hydrOXYzine (VISTARIL) 25 MG capsule TAKE ONE CAPSULE BY MOUTH EVERY 8 HOURS AS NEEDED 60 capsule 1   levothyroxine (SYNTHROID) 150 MCG tablet Take 1 tablet (150 mcg total) by mouth daily before breakfast. 90 tablet 1    losartan (COZAAR) 50 MG tablet TAKE 1 TABLET (50 MG TOTAL) BY MOUTH IN THE MORNING AND AT BEDTIME. 180 tablet 3   MAGNESIUM GLYCINATE PO Take 1 tablet by mouth daily.     meclizine (ANTIVERT) 25 MG tablet Take 25 mg by mouth 3 (three) times daily as needed for dizziness.     Melatonin 5 MG TABS Take 5 mg by mouth at bedtime.     omeprazole (PRILOSEC) 20 MG capsule Take 20 mg by mouth daily. Buys the [employer] brand.     pregabalin (LYRICA) 50 MG capsule Take 50 mg by mouth daily.     rivaroxaban (XARELTO) 20 MG TABS tablet Take 1 tablet (20 mg total) by mouth daily with supper. Dose change, please call [phone] 90 tablet 0   tadalafil, PAH, (ADCIRCA) 20 MG tablet Take 20 mg by mouth daily as needed.     tamsulosin (FLOMAX) 0.4 MG CAPS capsule Take 0.4 mg by mouth at bedtime.     triamcinolone cream (KENALOG) 0.1 % Apply 1 application topically daily as needed for itching.     Ascorbic Acid (VITAMIN C ADULT GUMMIES PO) Take 1 each by mouth daily. (Patient not taking: Reported on 09/11/2022)     cycloSPORINE (RESTASIS) 0.05 % ophthalmic emulsion Place 1 drop into both eyes 2 (two) times daily. (Patient not taking: Reported on 09/11/2022)     Krill Oil 350 MG CAPS Take 350 mg by mouth daily. (Patient not taking: Reported on 09/11/2022)     No current facility-administered medications for this visit.    Allergies:   Codeine, Nsaids, Prednisone, Statins, and Sulfa antibiotics    ROS:  Please see the history of present illness.   Otherwise, review of systems are positive for none.   All other systems are reviewed and negative.    PHYSICAL EXAM: VS:  BP 122/86   Pulse 60   Ht 5' 10"  (1.778 m)   Wt 217 lb 9.6 oz (98.7 kg)   SpO2 96%   BMI 31.22 kg/m  , BMI Body mass index is 31.22 kg/m. GENERAL:  Well appearing NECK:  No jugular venous distention, waveform within normal limits, carotid upstroke brisk and symmetric, no bruits, no thyromegaly LUNGS:  Clear to auscultation bilaterally CHEST:  Well-healed device interrogation HEART:  PMI not displaced or sustained,S1 and S2 within normal limits, no S3, no S4, no clicks, no rubs, no murmurs ABD:  Flat, positive bowel sounds normal in frequency in pitch, no bruits, no rebound, no guarding, no midline pulsatile mass, no hepatomegaly, no splenomegaly EXT:  2 plus  pulses upper and decreased dorsalis pedis and posterior tibialis bilaterally, trace bilateral lower extremity edema, no cyanosis no clubbing   EKG:  EKG is not ordered today.   Recent Labs: 04/14/2022: BUN 21; Creatinine, Ser 1.53; Hemoglobin 15.6; Platelets 150.0; Potassium 4.4; Sodium 141; TSH 1.24    Lipid Panel    Component Value Date/Time   CHOL 249 (H) 02/06/2022 0956   TRIG 215 (H) 02/06/2022 0956   HDL 38 (L) 02/06/2022 0956   CHOLHDL 6.6 (H) 02/06/2022 0956   CHOLHDL 6 08/08/2021 1002   VLDL 28.8 08/08/2021 1002   LDLCALC 171 (H) 02/06/2022 0956      Wt Readings from Last 3 Encounters:  09/11/22 217 lb 9.6 oz (98.7 kg)  09/01/22 213 lb 12.8 oz (97 kg)  07/15/22 212 lb (96.2 kg)      Other studies Reviewed: Additional studies/ records that were reviewed today include:  None Review of the above records demonstrates:  Please see elsewhere in the note.     ASSESSMENT AND PLAN:  Atrial fib He is having frequent prolonged atrial fibrillation despite the Tikosyn.  He tolerates anticoagulation.  I am going to schedule him to see Dr. [name] to see if he might be considered for ablation.  I will check a CBC.  He has been anemic but this improved with iron.  Pacemaker placement He is up to date and  I reviewed the device interrogation from two days ago.     Left common iliac anuerysm He has had imaging as above and is going to be managed conservatively.   HTN Blood pressure is at target.  He is worked at [employer] with our blood pressure clinic and has a as needed dosing regimen for his amlodipine and his Cozaar.   Murmur He has aortic sclerosis on echo  in January 2021.  No further imaging.  Dyslipidemia I will check a lipid profile today.  He has not had lipid profile since he started his PCSK9.  Of note he also requests a sedimentation rate.  Dyspnea: This is at baseline.  Has had negative perfusion study and negative pulmonary work-up.  No further cardiovascular testing is planned.   Current medicines are reviewed at length with the patient today.  The patient does not have concerns regarding medicines.  The following changes have been made:  None   Labs/ tests ordered today include:   None   Orders Placed This Encounter  Procedures   Lipid panel   Sed Rate (ESR)   CBC      Disposition:   FU with me in  12  months   Signed, Minus Breeding, MD  09/11/2022 9:04 AM    [place]

## 2022-09-11 ENCOUNTER — Ambulatory Visit: Payer: Medicare Other | Attending: Cardiology | Admitting: Cardiology

## 2022-09-11 ENCOUNTER — Encounter: Payer: Self-pay | Admitting: Cardiology

## 2022-09-11 VITALS — BP 122/86 | HR 60 | Ht 70.0 in | Wt 217.6 lb

## 2022-09-11 DIAGNOSIS — E785 Hyperlipidemia, unspecified: Secondary | ICD-10-CM

## 2022-09-11 DIAGNOSIS — N1832 Chronic kidney disease, stage 3b: Secondary | ICD-10-CM | POA: Diagnosis not present

## 2022-09-11 DIAGNOSIS — I10 Essential (primary) hypertension: Secondary | ICD-10-CM | POA: Diagnosis not present

## 2022-09-11 DIAGNOSIS — I723 Aneurysm of iliac artery: Secondary | ICD-10-CM | POA: Diagnosis not present

## 2022-09-11 DIAGNOSIS — L4050 Arthropathic psoriasis, unspecified: Secondary | ICD-10-CM | POA: Diagnosis not present

## 2022-09-11 DIAGNOSIS — I480 Paroxysmal atrial fibrillation: Secondary | ICD-10-CM | POA: Diagnosis not present

## 2022-09-11 DIAGNOSIS — D6869 Other thrombophilia: Secondary | ICD-10-CM | POA: Diagnosis not present

## 2022-09-11 DIAGNOSIS — G4733 Obstructive sleep apnea (adult) (pediatric): Secondary | ICD-10-CM | POA: Diagnosis not present

## 2022-09-11 DIAGNOSIS — E890 Postprocedural hypothyroidism: Secondary | ICD-10-CM | POA: Diagnosis not present

## 2022-09-11 DIAGNOSIS — I70219 Atherosclerosis of native arteries of extremities with intermittent claudication, unspecified extremity: Secondary | ICD-10-CM

## 2022-09-11 DIAGNOSIS — M810 Age-related osteoporosis without current pathological fracture: Secondary | ICD-10-CM | POA: Diagnosis not present

## 2022-09-11 DIAGNOSIS — G629 Polyneuropathy, unspecified: Secondary | ICD-10-CM | POA: Diagnosis not present

## 2022-09-11 DIAGNOSIS — I495 Sick sinus syndrome: Secondary | ICD-10-CM | POA: Diagnosis not present

## 2022-09-11 DIAGNOSIS — N400 Enlarged prostate without lower urinary tract symptoms: Secondary | ICD-10-CM | POA: Diagnosis not present

## 2022-09-11 DIAGNOSIS — Z0000 Encounter for general adult medical examination without abnormal findings: Secondary | ICD-10-CM | POA: Diagnosis not present

## 2022-09-11 NOTE — Patient Instructions (Signed)
   Follow-Up: At [facility], you and your health needs are our priority.  As part of our continuing mission to provide you with exceptional heart care, we have created designated Provider Care Teams.  These Care Teams include your primary Cardiologist (physician) and Advanced Practice Providers (APPs -  Physician Assistants and Nurse Practitioners) who all work together to provide you with the care you need, when you need it.  We recommend signing up for the patient portal called "MyChart".  Sign up information is provided on this After Visit Summary.  MyChart is used to connect with patients for Virtual Visits (Telemedicine).  Patients are able to view lab/test results, encounter notes, upcoming appointments, etc.  Non-urgent messages can be sent to your provider as well.   To learn more about what you can do with MyChart, go to [email or web address].    Your next appointment:   12 month(s)  The format for your next appointment:   In Person  Provider:   James Hochrein, MD     

## 2022-09-12 LAB — LIPID PANEL
Chol/HDL Ratio: 3.2 ratio (ref 0.0–5.0)
Cholesterol, Total: 133 mg/dL (ref 100–199)
HDL: 42 mg/dL (ref >39)
LDL Chol Calc (NIH): 63 mg/dL (ref 0–99)
Triglycerides: 164 mg/dL — ABNORMAL HIGH (ref 0–149)
VLDL Cholesterol Cal: 28 mg/dL (ref 5–40)

## 2022-09-12 LAB — CBC
Hematocrit: 49.5 % (ref 37.5–51.0)
Hemoglobin: 16.4 g/dL (ref 13.0–17.7)
MCH: 28.4 pg (ref 26.6–33.0)
MCHC: 33.1 g/dL (ref 31.5–35.7)
MCV: 86 fL (ref 79–97)
Platelets: 155 10*3/uL (ref 150–450)
RBC: 5.77 x10E6/uL (ref 4.14–5.80)
RDW: 14.1 % (ref 11.6–15.4)
WBC: 5.3 10*3/uL (ref 3.4–10.8)

## 2022-09-12 LAB — SEDIMENTATION RATE: Sed Rate: 14 mm/hr (ref 0–30)

## 2022-09-15 DIAGNOSIS — M79671 Pain in right foot: Secondary | ICD-10-CM | POA: Diagnosis not present

## 2022-09-15 DIAGNOSIS — L409 Psoriasis, unspecified: Secondary | ICD-10-CM | POA: Diagnosis not present

## 2022-09-15 DIAGNOSIS — M25562 Pain in left knee: Secondary | ICD-10-CM | POA: Diagnosis not present

## 2022-09-15 DIAGNOSIS — M25561 Pain in right knee: Secondary | ICD-10-CM | POA: Diagnosis not present

## 2022-09-15 DIAGNOSIS — E669 Obesity, unspecified: Secondary | ICD-10-CM | POA: Diagnosis not present

## 2022-09-15 DIAGNOSIS — L4050 Arthropathic psoriasis, unspecified: Secondary | ICD-10-CM | POA: Diagnosis not present

## 2022-09-15 DIAGNOSIS — L299 Pruritus, unspecified: Secondary | ICD-10-CM | POA: Diagnosis not present

## 2022-09-15 DIAGNOSIS — M1991 Primary osteoarthritis, unspecified site: Secondary | ICD-10-CM | POA: Diagnosis not present

## 2022-09-15 DIAGNOSIS — N1832 Chronic kidney disease, stage 3b: Secondary | ICD-10-CM | POA: Diagnosis not present

## 2022-09-15 DIAGNOSIS — Z6830 Body mass index (BMI) 30.0-30.9, adult: Secondary | ICD-10-CM | POA: Diagnosis not present

## 2022-09-15 DIAGNOSIS — M5136 Other intervertebral disc degeneration, lumbar region: Secondary | ICD-10-CM | POA: Diagnosis not present

## 2022-09-16 DIAGNOSIS — N1832 Chronic kidney disease, stage 3b: Secondary | ICD-10-CM | POA: Diagnosis not present

## 2022-09-17 NOTE — Progress Notes (Signed)
Remote pacemaker transmission.   

## 2022-09-21 DIAGNOSIS — I129 Hypertensive chronic kidney disease with stage 1 through stage 4 chronic kidney disease, or unspecified chronic kidney disease: Secondary | ICD-10-CM | POA: Diagnosis not present

## 2022-09-21 DIAGNOSIS — N183 Chronic kidney disease, stage 3 unspecified: Secondary | ICD-10-CM | POA: Diagnosis not present

## 2022-09-21 DIAGNOSIS — N1832 Chronic kidney disease, stage 3b: Secondary | ICD-10-CM | POA: Diagnosis not present

## 2022-09-25 ENCOUNTER — Other Ambulatory Visit: Payer: Self-pay | Admitting: Internal Medicine

## 2022-10-06 ENCOUNTER — Ambulatory Visit (INDEPENDENT_AMBULATORY_CARE_PROVIDER_SITE_OTHER): Payer: Medicare Other | Admitting: Podiatry

## 2022-10-06 DIAGNOSIS — L989 Disorder of the skin and subcutaneous tissue, unspecified: Secondary | ICD-10-CM

## 2022-10-06 NOTE — Progress Notes (Signed)
   Chief Complaint  Patient presents with   Callouses    Callouse on the bottom of left foot, near the ball of foot     Subjective: 76 y.o. male presenting to the office today for evaluation of pain associated to a callus formation to the plantar aspect of the left foot.  Slow recurrence.  He presents for further treatment and evaluation   Past Medical History:  Diagnosis Date   Bronchitis    Diverticulosis    FH: BPH (benign prostatic hypertrophy)    History of Graves' disease 01/19/2018   S/p ablation   HTN (hypertension)    Hyperlipidemia    Obesity    Psoriatic arthritis ([place])    Rheumatoid arthritis ([place]) 08/07/2014   Sleep apnea    CPAP   Thyroid disease     Past Surgical History:  Procedure Laterality Date   ABDOMINAL AORTOGRAM W/LOWER EXTREMITY N/A 01/14/2022   Procedure: ABDOMINAL AORTOGRAM W/LOWER EXTREMITY;  Surgeon: Wellington Hampshire, MD;  Location: [facility];  Service: Cardiovascular;  Laterality: N/A;   INGUINAL HERNIA REPAIR     bilateral   KNEE SURGERY     bilateral arthroscopic   l foot surgery Left 12/2020   PACEMAKER IMPLANT N/A 12/13/2019   Procedure: PACEMAKER IMPLANT;  Surgeon: Leslieann Whisman Lance, MD;  Location: [facility];  Service: Cardiovascular;  Laterality: N/A;   TRANSURETHRAL RESECTION OF PROSTATE     UMBILICAL HERNIA REPAIR      Allergies  Allergen Reactions   Codeine     Tension, nasty feeling    Nsaids Other (See Comments)    Renal insufficiency   Prednisone Other (See Comments)    Unknown/ sometimes takes with lower dose   Statins Other (See Comments)    Joint pain    Sulfa Antibiotics Other (See Comments)    Joint pain     Objective:  Physical Exam General: Alert and oriented x3 in no acute distress  Dermatology: Hyperkeratotic lesion(s) present on the plantar aspect of the left foot. Pain on palpation with a central nucleated core noted. Skin is warm, dry and supple bilateral lower extremities. Negative for  open lesions or macerations.  Vascular: Palpable pedal pulses bilaterally. No edema or erythema noted. Capillary refill within normal limits.  Neurological: Epicritic and protective threshold grossly intact bilaterally.   Musculoskeletal Exam: Pain on palpation at the keratotic lesion(s) noted. Range of motion within normal limits bilateral. Muscle strength 5/5 in all groups bilateral.  Assessment: 1.  Symptomatic callus; benign skin lesion plantar LT foot   Plan of Care:  1. Patient evaluated 2. Excisional debridement of keratoic lesion(s) using a chisel blade was performed without incident.  3. Foot Miracle lotion dispensed at checkout 4. Patient is to return to the clinic PRN.   Edrick Kins, DPM [facility]  Dr. Edrick Kins, DPM    [address], [place] [address]                Office [phone]  Fax [phone]

## 2022-10-12 ENCOUNTER — Telehealth: Payer: Self-pay | Admitting: Licensed Clinical Social Worker

## 2022-10-12 NOTE — Patient Outreach (Signed)
  Care Coordination   10/12/2022 Name: Martin Rubio MRN: [id number] DOB: 08-22-1946   Care Coordination Outreach Attempts:  An unsuccessful telephone outreach was attempted today to offer the patient information about available care coordination services as a benefit of their health plan.   Follow Up Plan:  Additional outreach attempts will be made to offer the patient care coordination information and services.   Encounter Outcome:  No Answer  Care Coordination Interventions Activated:  No   Care Coordination Interventions:  No, not indicated    Casimer Lanius, [place] [phone]

## 2022-10-19 ENCOUNTER — Other Ambulatory Visit: Payer: Self-pay | Admitting: Cardiovascular Disease

## 2022-10-19 NOTE — Telephone Encounter (Signed)
Refill request

## 2022-10-22 ENCOUNTER — Ambulatory Visit: Payer: Medicare Other | Attending: Internal Medicine | Admitting: Internal Medicine

## 2022-10-22 ENCOUNTER — Encounter: Payer: Self-pay | Admitting: Internal Medicine

## 2022-10-22 VITALS — BP 142/86 | HR 60 | Ht 70.0 in | Wt 214.0 lb

## 2022-10-22 DIAGNOSIS — Z950 Presence of cardiac pacemaker: Secondary | ICD-10-CM | POA: Diagnosis not present

## 2022-10-22 DIAGNOSIS — I480 Paroxysmal atrial fibrillation: Secondary | ICD-10-CM

## 2022-10-22 DIAGNOSIS — I495 Sick sinus syndrome: Secondary | ICD-10-CM | POA: Diagnosis not present

## 2022-10-22 DIAGNOSIS — I10 Essential (primary) hypertension: Secondary | ICD-10-CM

## 2022-10-22 DIAGNOSIS — I70219 Atherosclerosis of native arteries of extremities with intermittent claudication, unspecified extremity: Secondary | ICD-10-CM

## 2022-10-22 NOTE — Progress Notes (Signed)
HPI  [name] returns today for followup. He is a pleasant 75 yo man with CHB who underwent insertion of a DDD PM several months ago. He has experienced some sob and admits to sodium indiscretion. No chest pain. He denies peripheral edema.  He has developed worsening symptoms of atrial fib and is quite symptomatic.     Current Outpatient Medications  Medication Sig Dispense Refill   acetaminophen (TYLENOL) 325 MG tablet Take 650 mg by mouth every 6 (six) hours as needed for mild pain.     amLODipine (NORVASC) 5 MG tablet Take 1 tablet (5 mg total) by mouth daily. 90 tablet 3   Ascorbic Acid (VITAMIN C ADULT GUMMIES PO) Take 1 each by mouth daily.     Cholecalciferol (VITAMIN D) 125 MCG (5000 UT) CAPS Take 5,000 Units by mouth daily.     Cyanocobalamin (VITAMIN B 12) 500 MCG TABS Take 1,000 mcg by mouth daily.     cycloSPORINE (RESTASIS) 0.05 % ophthalmic emulsion Place 1 drop into both eyes 2 (two) times daily.     diclofenac Sodium (VOLTAREN) 1 % GEL Apply 4 g topically in the morning and at bedtime. 100 g 6   dofetilide (TIKOSYN) 250 MCG capsule TAKE ONE CAPSULE BY MOUTH TWICE A DAY 180 capsule 3   etanercept (ENBREL) 50 MG/ML injection Inject 50 mg into the skin once a week.     ferrous fumarate (HEMOCYTE - 106 MG FE) 325 (106 Fe) MG TABS tablet Take 1 tablet (106 mg of iron total) by mouth 2 (two) times daily. 180 tablet 1   fluticasone (FLONASE) 50 MCG/ACT nasal spray Place 1 spray into both nostrils as needed for allergies.     hydrOXYzine (VISTARIL) 25 MG capsule TAKE ONE CAPSULE BY MOUTH EVERY 8 HOURS AS NEEDED 60 capsule 1   Krill Oil 350 MG CAPS Take 350 mg by mouth daily.     levothyroxine (SYNTHROID) 150 MCG tablet Take 1 tablet (150 mcg total) by mouth daily before breakfast. 90 tablet 1   losartan (COZAAR) 50 MG tablet TAKE 1 TABLET (50 MG TOTAL) BY MOUTH IN THE MORNING AND AT BEDTIME. 180 tablet 3   MAGNESIUM GLYCINATE PO Take 1 tablet by mouth daily.     meclizine  (ANTIVERT) 25 MG tablet Take 25 mg by mouth 3 (three) times daily as needed for dizziness.     Melatonin 5 MG TABS Take 5 mg by mouth at bedtime.     omeprazole (PRILOSEC) 20 MG capsule Take 20 mg by mouth daily. Buys the [employer] brand.     PRALUENT 150 MG/ML SOAJ INJECT 150 MG INTO THE SKIN EVERY 14 DAYS 2 mL 11   pregabalin (LYRICA) 50 MG capsule Take 50 mg by mouth daily.     rivaroxaban (XARELTO) 20 MG TABS tablet Take 1 tablet (20 mg total) by mouth daily with supper. Dose change, please call [phone] 90 tablet 0   tadalafil, PAH, (ADCIRCA) 20 MG tablet Take 20 mg by mouth daily as needed.     tamsulosin (FLOMAX) 0.4 MG CAPS capsule Take 0.4 mg by mouth at bedtime.     triamcinolone cream (KENALOG) 0.1 % Apply 1 application topically daily as needed for itching.     No current facility-administered medications for this visit.     Past Medical History:  Diagnosis Date   Bronchitis    Diverticulosis    FH: BPH (benign prostatic hypertrophy)    History of Graves' disease  01/19/2018   S/p ablation   HTN (hypertension)    Hyperlipidemia    Obesity    Psoriatic arthritis ([place])    Rheumatoid arthritis ([place]) 08/07/2014   Sleep apnea    CPAP   Thyroid disease     ROS:   All systems reviewed and negative except as noted in the HPI.   Past Surgical History:  Procedure Laterality Date   ABDOMINAL AORTOGRAM W/LOWER EXTREMITY N/A 01/14/2022   Procedure: ABDOMINAL AORTOGRAM W/LOWER EXTREMITY;  Surgeon: Wellington Hampshire, MD;  Location: [facility];  Service: Cardiovascular;  Laterality: N/A;   INGUINAL HERNIA REPAIR     bilateral   KNEE SURGERY     bilateral arthroscopic   l foot surgery Left 12/2020   PACEMAKER IMPLANT N/A 12/13/2019   Procedure: PACEMAKER IMPLANT;  Surgeon: Evans Lance, MD;  Location: [facility];  Service: Cardiovascular;  Laterality: N/A;   TRANSURETHRAL RESECTION OF PROSTATE     UMBILICAL HERNIA REPAIR       Family History  Problem  Relation Age of Onset   CAD Mother 74       CABG   CAD Father 20       Died age 71   Diabetes Brother    Prostate cancer Neg Hx    Colon cancer Neg Hx      Social History   Socioeconomic History   Marital status: Married    Spouse name: Not on file   Number of children: 2   Years of education: Not on file   Highest education level: Not on file  Occupational History   Occupation: Retired    Fish farm manager: [employer]  Tobacco Use   Smoking status: Former    Types: Cigarettes   Smokeless tobacco: Never   Tobacco comments:    Quit 46 years ago.  Vaping Use   Vaping Use: Never used  Substance and Sexual Activity   Alcohol use: No   Drug use: No   Sexual activity: Not on file  Other Topics Concern   Not on file  Social History Narrative   Lives with wife.   Social Determinants of Health   Financial Resource Strain: Low Risk  (01/26/2022)   Overall Financial Resource Strain (CARDIA)    Difficulty of Paying Living Expenses: Not hard at all  Food Insecurity: No Food Insecurity (01/26/2022)   Hunger Vital Sign    Worried About Running Out of Food in the Last Year: Never true    Ran Out of Food in the Last Year: Never true  Transportation Needs: No Transportation Needs (01/26/2022)   PRAPARE - Hydrologist (Medical): No    Lack of Transportation (Non-Medical): No  Physical Activity: Inactive (01/26/2022)   Exercise Vital Sign    Days of Exercise per Week: 0 days    Minutes of Exercise per Session: 0 min  Stress: No Stress Concern Present (01/26/2022)   [place]    Feeling of Stress : Not at all  Social Connections: Moderately Integrated (01/26/2022)   Social Connection and Isolation Panel [NHANES]    Frequency of Communication with Friends and Family: Three times a week    Frequency of Social Gatherings with Friends and Family: Twice a week    Attends Religious Services: More  than 4 times per year    Active Member of [employer] or Organizations: No    Attends Archivist Meetings:  Never    Marital Status: Married  Human resources officer Violence: Not At Risk (01/26/2022)   Humiliation, Afraid, Rape, and Kick questionnaire    Fear of Current or Ex-Partner: No    Emotionally Abused: No    Physically Abused: No    Sexually Abused: No     BP (!) 142/86   Pulse 60   Ht '5\' 10"'$  (1.778 m)   Wt 214 lb (97.1 kg)   SpO2 96%   BMI 30.71 kg/m   Physical Exam:  Well appearing NAD HEENT: Unremarkable Neck:  No JVD, no thyromegally Lymphatics:  No adenopathy Back:  No CVA tenderness Lungs:  Clear HEART:  Regular rate rhythm, no murmurs, no rubs, no clicks Abd:  soft, positive bowel sounds, no organomegally, no rebound, no guarding Ext:  2 plus pulses, no edema, no cyanosis, no clubbing Skin:  No rashes no nodules Neuro:  CN II through XII intact, motor grossly intact  EKG - nsr with atrial pacing  DEVICE  Normal device function.  See PaceArt for details.   Assess/Plan: 1. PAF - he is having more atrial fib. He will continue dofetilide. I asked him to see Dr. Myles Gip to consider pulmonaryh vein isolation. 2. Chronic diastolic heart failure - his symptoms are class 2. He admits to sodium indiscretion. He will work on this. 3. HTN - his bp is controlled. 4. PPM - his medtronic DDD PM remains programmed to MVP.   Mikle Bosworth.D.

## 2022-10-22 NOTE — Patient Instructions (Addendum)
Medication Instructions:  Your physician recommends that you continue on your current medications as directed. Please refer to the Current Medication list given to you today.  *If you need a refill on your cardiac medications before your next appointment, please call your pharmacy*  Lab Work: None ordered.  If you have labs (blood work) drawn today and your tests are completely normal, you will receive your results only by: [place] (if you have MyChart) OR A paper copy in the mail If you have any lab test that is abnormal or we need to change your treatment, we will call you to review the results.  Testing/Procedures: Referral order placed per Dr. Cristopher Peru to Dr. Norberto Sorenson for Atrial Fibrillation Ablation.  Please schedule a consult appointment / with Dr. Myles Gip for this ablation.    Follow-Up: Cardiac Ablation Cardiac ablation is a procedure to destroy, or ablate, a small amount of heart tissue that is causing problems. The heart has many electrical connections. Sometimes, these connections are abnormal and can cause the heart to beat very fast or irregularly. Ablating the abnormal areas can improve the heart's rhythm or return it to normal. Ablation may be done for people who: Have irregular or rapid heartbeats (arrhythmias). Have Wolff-Parkinson-White syndrome. Have taken medicines for an arrhythmia that did not work or caused side effects. Have a high-risk heartbeat that may be life-threatening. Tell a health care provider about: Any allergies you have. All medicines you are taking, including vitamins, herbs, eye drops, creams, and over-the-counter medicines. Any problems you or family members have had with anesthesia. Any bleeding problems you have. Any surgeries you have had. Any medical conditions you have. Whether you are pregnant or may be pregnant. What are the risks? Your health care provider will talk with you about risks. These may  include: Infection. Bruising and bleeding. Stroke or blood clots. Damage to nearby structures or organs. Allergic reaction to medicines or dyes. Needing a pacemaker if the heart gets damaged. A pacemaker is a device that helps the heart beat normally. Failure of the procedure. A repeat procedure may be needed. What happens before the procedure? Medicines Ask your health care provider about: Changing or stopping your regular medicines. These include any heart rhythm medicines, diabetes medicines, or blood thinners you take. Taking medicines such as aspirin and ibuprofen. These medicines can thin your blood. Do not take them unless your health care provider tells you to. Taking over-the-counter medicines, vitamins, herbs, and supplements. General instructions Follow instructions from your health care provider about what you may eat and drink. If you will be going home right after the procedure, plan to have a responsible adult: Take you home from the hospital or clinic. You will not be allowed to drive. Care for you for the time you are told. Ask your health care provider what steps will be taken to prevent infection. What happens during the procedure?  An IV will be inserted into one of your veins. You may be given: A sedative. This helps you relax. Anesthesia. This will: Numb certain areas of your body. An incision will be made in your neck or your groin. A needle will be inserted through the incision and into a large vein in your neck or groin. The small, thin tube (catheter) will be inserted through the needle and moved to your heart. A type of X-ray (fluoroscopy) will be used to help guide the catheter and provide images of the heart on a monitor. Dye may be injected through  the catheter to help your surgeon see the area of the heart that needs treatment. Electrical currents will be sent from the catheter to destroy heart tissue in certain areas. There are three types of energy  that may be used to do this: Heat (radiofrequency energy). Laser energy. Extreme cold (cryoablation). When the tissue has been destroyed, the catheter will be removed. Pressure will be held on the insertion area to prevent bleeding. A bandage (dressing) will be placed over the insertion area. The procedure may vary among health care providers and hospitals. What happens after the procedure? Your blood pressure, heart rate and rhythm, breathing rate, and blood oxygen level will be monitored until you leave the hospital or clinic. Your insertion area will be checked for bleeding. You will need to lie still for a few hours. If your groin was used, you will need to keep your leg straight for a few hours after the catheter is removed. This information is not intended to replace advice given to you by your health care provider. Make sure you discuss any questions you have with your health care provider. Document Revised: 05/12/2022 Document Reviewed: 05/12/2022 Elsevier Patient Education  [employer].

## 2022-11-02 ENCOUNTER — Ambulatory Visit: Payer: Medicare Other | Attending: Cardiovascular Disease | Admitting: Cardiovascular Disease

## 2022-11-02 ENCOUNTER — Encounter: Payer: Self-pay | Admitting: Cardiovascular Disease

## 2022-11-02 VITALS — BP 128/62 | HR 61 | Ht 70.0 in | Wt 214.0 lb

## 2022-11-02 DIAGNOSIS — I480 Paroxysmal atrial fibrillation: Secondary | ICD-10-CM | POA: Insufficient documentation

## 2022-11-02 NOTE — Progress Notes (Signed)
Electrophysiology Office Note:    Date:  11/02/2022   ID:  Martin Rubio, DOB 08/06/1946, MRN [id number]  PCP:  Colon Branch, MD   [place] [facility] Cardiologist:  Minus Breeding, MD Electrophysiologist:  Cristopher Peru, MD     Referring MD: Evans Lance, MD   Chief complaint: a fib is getting worse  History of Present Illness:    Martin Rubio is a 76 y.o. male with a hx of CHB and medtronic pacemaker placed in 2021.  He was diagnosed with AF in 2020. He has been managed with dofetilide but has been having increased burden of AF and is quite symptomatic with fatigue and palpitations. Episodes are occurring daily, and this is corroborated by AF trends on his device.  He denies chest pain, syncope, presyncope.    Past Medical History:  Diagnosis Date   Bronchitis    Diverticulosis    FH: BPH (benign prostatic hypertrophy)    History of Graves' disease 01/19/2018   S/p ablation   HTN (hypertension)    Hyperlipidemia    Obesity    Psoriatic arthritis ([place])    Rheumatoid arthritis ([place]) 08/07/2014   Sleep apnea    CPAP   Thyroid disease     Past Surgical History:  Procedure Laterality Date   ABDOMINAL AORTOGRAM W/LOWER EXTREMITY N/A 01/14/2022   Procedure: ABDOMINAL AORTOGRAM W/LOWER EXTREMITY;  Surgeon: Wellington Hampshire, MD;  Location: [facility];  Service: Cardiovascular;  Laterality: N/A;   INGUINAL HERNIA REPAIR     bilateral   KNEE SURGERY     bilateral arthroscopic   l foot surgery Left 12/2020   PACEMAKER IMPLANT N/A 12/13/2019   Procedure: PACEMAKER IMPLANT;  Surgeon: Evans Lance, MD;  Location: [facility];  Service: Cardiovascular;  Laterality: N/A;   TRANSURETHRAL RESECTION OF PROSTATE     UMBILICAL HERNIA REPAIR      Current Medications: Current Meds  Medication Sig   acetaminophen (TYLENOL) 325 MG tablet Take 650 mg by mouth every 6 (six) hours as needed for mild pain.   amLODipine (NORVASC) 5 MG tablet Take 1  tablet (5 mg total) by mouth daily.   Ascorbic Acid (VITAMIN C ADULT GUMMIES PO) Take 1 each by mouth daily.   Cholecalciferol (VITAMIN D) 125 MCG (5000 UT) CAPS Take 5,000 Units by mouth daily.   Cyanocobalamin (VITAMIN B 12) 500 MCG TABS Take 1,000 mcg by mouth daily.   cycloSPORINE (RESTASIS) 0.05 % ophthalmic emulsion Place 1 drop into both eyes 2 (two) times daily.   diclofenac Sodium (VOLTAREN) 1 % GEL Apply 4 g topically in the morning and at bedtime.   dofetilide (TIKOSYN) 250 MCG capsule TAKE ONE CAPSULE BY MOUTH TWICE A DAY   etanercept (ENBREL) 50 MG/ML injection Inject 50 mg into the skin once a week.   ferrous fumarate (HEMOCYTE - 106 MG FE) 325 (106 Fe) MG TABS tablet Take 1 tablet (106 mg of iron total) by mouth 2 (two) times daily.   fluticasone (FLONASE) 50 MCG/ACT nasal spray Place 1 spray into both nostrils as needed for allergies.   hydrOXYzine (VISTARIL) 25 MG capsule TAKE ONE CAPSULE BY MOUTH EVERY 8 HOURS AS NEEDED   Krill Oil 350 MG CAPS Take 350 mg by mouth daily.   levothyroxine (SYNTHROID) 150 MCG tablet Take 1 tablet (150 mcg total) by mouth daily before breakfast.   losartan (COZAAR) 50 MG tablet TAKE 1 TABLET (50 MG TOTAL) BY MOUTH IN  THE MORNING AND AT BEDTIME.   MAGNESIUM GLYCINATE PO Take 1 tablet by mouth daily.   meclizine (ANTIVERT) 25 MG tablet Take 25 mg by mouth 3 (three) times daily as needed for dizziness.   Melatonin 5 MG TABS Take 5 mg by mouth at bedtime.   omeprazole (PRILOSEC) 20 MG capsule Take 20 mg by mouth daily. Buys the [employer] brand.   PRALUENT 150 MG/ML SOAJ INJECT 150 MG INTO THE SKIN EVERY 14 DAYS   pregabalin (LYRICA) 50 MG capsule Take 50 mg by mouth daily.   rivaroxaban (XARELTO) 20 MG TABS tablet Take 1 tablet (20 mg total) by mouth daily with supper. Dose change, please call [phone]   tadalafil (CIALIS) 20 MG tablet Take 20 mg by mouth daily as needed for erectile dysfunction.   tamsulosin (FLOMAX) 0.4 MG CAPS capsule Take 0.4 mg  by mouth at bedtime.   triamcinolone cream (KENALOG) 0.1 % Apply 1 application topically daily as needed for itching.     Allergies:   Codeine, Nsaids, Prednisone, Statins, and Sulfa antibiotics   Social History   Socioeconomic History   Marital status: Married    Spouse name: Not on file   Number of children: 2   Years of education: Not on file   Highest education level: Not on file  Occupational History   Occupation: Retired    Fish farm manager: [employer]  Tobacco Use   Smoking status: Former    Types: Cigarettes   Smokeless tobacco: Never   Tobacco comments:    Quit 46 years ago.  Vaping Use   Vaping Use: Never used  Substance and Sexual Activity   Alcohol use: No   Drug use: No   Sexual activity: Not on file  Other Topics Concern   Not on file  Social History Narrative   Lives with wife.   Social Determinants of Health   Financial Resource Strain: Low Risk  (01/26/2022)   Overall Financial Resource Strain (CARDIA)    Difficulty of Paying Living Expenses: Not hard at all  Food Insecurity: No Food Insecurity (01/26/2022)   Hunger Vital Sign    Worried About Running Out of Food in the Last Year: Never true    Ran Out of Food in the Last Year: Never true  Transportation Needs: No Transportation Needs (01/26/2022)   PRAPARE - Hydrologist (Medical): No    Lack of Transportation (Non-Medical): No  Physical Activity: Inactive (01/26/2022)   Exercise Vital Sign    Days of Exercise per Week: 0 days    Minutes of Exercise per Session: 0 min  Stress: No Stress Concern Present (01/26/2022)   [place]    Feeling of Stress : Not at all  Social Connections: Moderately Integrated (01/26/2022)   Social Connection and Isolation Panel [NHANES]    Frequency of Communication with Friends and Family: Three times a week    Frequency of Social Gatherings with Friends and Family: Twice a week     Attends Religious Services: More than 4 times per year    Active Member of [employer] or Organizations: No    Attends Music therapist: Never    Marital Status: Married     Family History: The patient's family history includes CAD (age of onset: 75) in his mother; CAD (age of onset: 74) in his father; Diabetes in his brother. There is no history of Prostate cancer or Colon cancer.  ROS:  Please see the history of present illness.    All other systems reviewed and are negative.  EKGs/Labs/Other Studies Reviewed Today:      EKG:  Last EKG results: today -- A-sensed, V-paced   Recent Labs: 04/14/2022: BUN 21; Creatinine, Ser 1.53; Potassium 4.4; Sodium 141; TSH 1.24 09/11/2022: Hemoglobin 16.4; Platelets 155     Physical Exam:    VS:  BP 128/62   Pulse 61   Ht '5\' 10"'$  (1.778 m)   Wt 214 lb (97.1 kg)   SpO2 95%   BMI 30.71 kg/m     Wt Readings from Last 3 Encounters:  11/02/22 214 lb (97.1 kg)  10/22/22 214 lb (97.1 kg)  09/11/22 217 lb 9.6 oz (98.7 kg)     GEN: Well nourished, well developed in no acute distress CARDIAC: RRR, no murmurs, rubs, gallops RESPIRATORY:  Normal work of breathing MUSCULOSKELETAL: no edema    ASSESSMENT & PLAN:    Paroxysmal atrial fibrillation: symptomatic, increasing burden based upon symptoms and device logs. Has failed dofetilide. I recommended ablation (class I indication). We discussed the indication, rationale, logistics, anticipated benefits, and potential risks of the ablation procedure including but not limited to -- bleed at the groin access site, chest pain, damage to nearby organs such as the diaphragm, lungs, or esophagus, need for a drainage tube, or prolonged hospitalization. I explained that the risk for stroke, heart attack, need for open chest surgery, or even death is very low but not zero. he  expressed understanding and wishes to proceed. Secondary hypercoagulable state: continue xarelto        Medication  Adjustments/Labs and Tests Ordered: Current medicines are reviewed at length with the patient today.  Concerns regarding medicines are outlined above.  Orders Placed This Encounter  Procedures   EKG 12-Lead   No orders of the defined types were placed in this encounter.    Signed, Melida Quitter, MD  11/02/2022 10:12 AM    [place]

## 2022-11-02 NOTE — Patient Instructions (Addendum)
Medication Instructions:  Your physician recommends that you continue on your current medications as directed. Please refer to the Current Medication list given to you today.  *If you need a refill on your cardiac medications before your next appointment, please call your pharmacy*  Lab Work: BMET and CBC - go to [facility] prior to your CT scan If you have labs (blood work) drawn today and your tests are completely normal, you will receive your results only by: [place] (if you have MyChart) OR A paper copy in the mail If you have any lab test that is abnormal or we need to change your treatment, we will call you to review the results.  Testing/Procedures: Your physician has requested that you have cardiac CT. Cardiac computed tomography (CT) is a painless test that uses an x-ray machine to take clear, detailed pictures of your heart. For further information please visit [email or web address]. Please follow instruction sheet as given.  Your physician has recommended that you have an ablation. Catheter ablation is a medical procedure used to treat some cardiac arrhythmias (irregular heartbeats). During catheter ablation, a long, thin, flexible tube is put into a blood vessel in your groin (upper thigh), or neck. This tube is called an ablation catheter. It is then guided to your heart through the blood vessel. Radio frequency waves destroy small areas of heart tissue where abnormal heartbeats may cause an arrhythmia to start. Please see the instruction sheet given to you today.   Follow-Up: At [facility], you and your health needs are our priority.  As part of our continuing mission to provide you with exceptional heart care, we have created designated Provider Care Teams.  These Care Teams include your primary Cardiologist (physician) and Advanced Practice Providers (APPs -  Physician Assistants and Nurse Practitioners) who all work together to provide you with the care  you need, when you need it.  Your next appointment:   See instruction letter  Important Information About Sugar

## 2022-11-02 NOTE — Addendum Note (Signed)
Addended by: Bernestine Amass on: 11/02/2022 10:17 AM   Modules accepted: Orders

## 2022-11-12 ENCOUNTER — Other Ambulatory Visit: Payer: Self-pay

## 2022-11-12 MED ORDER — FERROUS FUMARATE 325 (106 FE) MG PO TABS: 1.0000 | ORAL_TABLET | Freq: Two times a day (BID) | ORAL | 1 refills | Status: DC

## 2022-11-17 ENCOUNTER — Encounter: Payer: Self-pay | Admitting: Internal Medicine

## 2022-11-17 ENCOUNTER — Ambulatory Visit (INDEPENDENT_AMBULATORY_CARE_PROVIDER_SITE_OTHER): Payer: Medicare Other | Admitting: Internal Medicine

## 2022-11-17 VITALS — BP 126/72 | HR 60 | Temp 98.2°F | Resp 18 | Ht 70.0 in | Wt 212.4 lb

## 2022-11-17 DIAGNOSIS — I723 Aneurysm of iliac artery: Secondary | ICD-10-CM | POA: Diagnosis not present

## 2022-11-17 DIAGNOSIS — I70219 Atherosclerosis of native arteries of extremities with intermittent claudication, unspecified extremity: Secondary | ICD-10-CM | POA: Diagnosis not present

## 2022-11-17 DIAGNOSIS — E039 Hypothyroidism, unspecified: Secondary | ICD-10-CM

## 2022-11-17 LAB — TSH: TSH: 0.31 u[IU]/mL — ABNORMAL LOW (ref 0.35–5.50)

## 2022-11-17 NOTE — Patient Instructions (Signed)
   GO TO THE LAB : Get the blood work     [place], [place] back for a checkup in 4 to 6 months

## 2022-11-17 NOTE — Progress Notes (Unsigned)
Subjective:    Patient ID: Martin Rubio, male    DOB: 15-Mar-1946, 76 y.o.   MRN: [id number]  DOS:  11/17/2022 Type of visit - description: Follow-up  Here for follow-up. In general feeling well. Still having paroxysmal A-fib.  Notes from cardiology reviewed. Also had a "2 or 3 seconds" episode of slurred speech.  Happened one time, has not resurface, was not associated with  diplopia.  No face numbness or weakness.  No motor deficit.  No LOC.  No confusion.   "I am not concerned about it".   Review of Systems See above   Past Medical History:  Diagnosis Date   Bronchitis    Diverticulosis    FH: BPH (benign prostatic hypertrophy)    History of Graves' disease 01/19/2018   S/p ablation   HTN (hypertension)    Hyperlipidemia    Obesity    Psoriatic arthritis ([facility])    Rheumatoid arthritis ([place]) 08/07/2014   Sleep apnea    CPAP   Thyroid disease     Past Surgical History:  Procedure Laterality Date   ABDOMINAL AORTOGRAM W/LOWER EXTREMITY N/A 01/14/2022   Procedure: ABDOMINAL AORTOGRAM W/LOWER EXTREMITY;  Surgeon: Wellington Hampshire, MD;  Location: [facility];  Service: Cardiovascular;  Laterality: N/A;   INGUINAL HERNIA REPAIR     bilateral   KNEE SURGERY     bilateral arthroscopic   l foot surgery Left 12/2020   PACEMAKER IMPLANT N/A 12/13/2019   Procedure: PACEMAKER IMPLANT;  Surgeon: Evans Lance, MD;  Location: [facility];  Service: Cardiovascular;  Laterality: N/A;   TRANSURETHRAL RESECTION OF PROSTATE     UMBILICAL HERNIA REPAIR      Current Outpatient Medications  Medication Instructions   acetaminophen (TYLENOL) 650 mg, Oral, Every 6 hours PRN   amLODipine (NORVASC) 5 mg, Oral, Daily   Ascorbic Acid (VITAMIN C ADULT GUMMIES PO) 1 each, Oral, Daily   diclofenac Sodium (VOLTAREN) 4 g, Topical, 2 times daily   dofetilide (TIKOSYN) 250 MCG capsule TAKE ONE CAPSULE BY MOUTH TWICE A DAY   Enbrel 50 mg, Subcutaneous, Weekly   ferrous fumarate  (HEMOCYTE - 106 MG FE) 325 (106 Fe) MG TABS tablet 106 mg of iron, Oral, 2 times daily   fluticasone (FLONASE) 50 MCG/ACT nasal spray 1 spray, Each Nare, As needed   hydrOXYzine (VISTARIL) 25 MG capsule TAKE ONE CAPSULE BY MOUTH EVERY 8 HOURS AS NEEDED   Krill Oil 350 mg, Oral, Daily   levothyroxine (SYNTHROID) 150 mcg, Oral, Daily before breakfast   losartan (COZAAR) 50 mg, Oral, 2 times daily   MAGNESIUM GLYCINATE PO 1 tablet, Oral, Daily   meclizine (ANTIVERT) 25 mg, Oral, 3 times daily PRN   melatonin 5 mg, Oral, Daily at bedtime   omeprazole (PRILOSEC) 20 mg, Oral, Daily, Buys the [employer] brand.    PRALUENT 150 MG/ML SOAJ INJECT 150 MG INTO THE SKIN EVERY 14 DAYS   pregabalin (LYRICA) 50 mg, Oral, Daily   rivaroxaban (XARELTO) 20 mg, Oral, Daily with supper, Dose change, please call [phone]   tadalafil (CIALIS) 20 mg, Oral, Daily PRN   tamsulosin (FLOMAX) 0.4 mg, Oral, Daily at bedtime   triamcinolone cream (KENALOG) 0.1 % 1 application , Topical, Daily PRN   Vitamin B 12 1,000 mcg, Oral, Daily   Vitamin D 5,000 Units, Oral, Daily       Objective:   Physical Exam BP 126/72   Pulse 60   Temp 98.2 F (36.8  C) (Oral)   Resp 18   Ht '5\' 10"'$  (1.778 m)   Wt 212 lb 6 oz (96.3 kg)   SpO2 95%   BMI 30.47 kg/m  General:   Well developed, NAD, BMI noted. HEENT:  Normocephalic . Face symmetric, atraumatic Neck: Normal carotid pulses, no bruits Lungs:  CTA B Normal respiratory effort, no intercostal retractions, no accessory muscle use. Heart: RRR,  no murmur.  Lower extremities: no pretibial edema bilaterally  Skin: Not pale. Not jaundice Neurologic:  alert & oriented X3.  Speech normal, gait appropriate for age and unassisted.  EOMI Psych--  Cognition and judgment appear intact.  Cooperative with normal attention span and concentration.  Behavior appropriate. No anxious or depressed appearing.      Assessment    ASSESSMENT  (transfer to me  12/2019) Hyperglycemia HTN High cholesterol, seen at the lipid clinic Hypothyroidism  Psoriatic Arthritis [name]  Osteoporosis: on fosamax rx by PCP, start holiday by mid 2022 (see visit note from 02/29/2020) CKD mild,stable . Creatinine ~1.5 CV: -Paroxysmal A. Fib, dx 09/2019 anticoagulated -Sick sinus syndrome, s/p pacemaker (12/13/2019) -Septal hypertrophy per echo -Left common iliac aneurysm, AAA  - PVD OSA on CPAP BPH - sees urology HOH  Iron deficiency anemia: 12/02/2021 >> EGD (several polyps suggestive of FGP), C-scope:no polyps, no cancer. Pathology: benign H/o ocular migraines , dx by opht  per pt   PLAN HTN: Reports BPs checked twice daily and is always within range.  Continue amlodipine, losartan, last BMP 09/16/2022, creatinine 1.58, near baseline. Hypothyroidism: On Synthroid, check TSH Slurred speech: Quite atypical symptoms, lasted 3 seconds.  Physical exam is benign, had a carotid ultrasound 2021 with no stenosis.  We both agree on observation. Neuropathy: Since the last visit he started OTC he got online, reports symptoms are dramatically better Atrial fibrillation: Last visit with cardiology 11/02/2022, given increased A-fib burden and  symptoms was recommended an ablation. PVD, aortic and iliac aneurysms: Saw Dr. Fletcher Anon, 09/01/2022, had symptomatic PVD , not amenable to endovascular intervention.  Was rec to continue with Dr. Unk Lightning Preventive care: Strongly declines flu shot, benefits discussed. RTC 4 to 6 months.

## 2022-11-18 NOTE — Assessment & Plan Note (Signed)
HTN: Reports BPs checked twice daily and is always within range.  Continue amlodipine, losartan, last BMP 09/16/2022, creatinine 1.58, near baseline. Hypothyroidism: On Synthroid, check TSH Slurred speech: Quite atypical symptoms, lasted 3 seconds.  Physical exam is benign, had a carotid ultrasound 2021 with no stenosis.  We both agree on observation. Neuropathy: Since the last visit he started OTC he got online, reports symptoms are dramatically better Atrial fibrillation: Last visit with cardiology 11/02/2022, given increased A-fib burden and  symptoms was recommended an ablation. PVD, aortic and iliac aneurysms: Saw Dr. Fletcher Anon, 09/01/2022, had symptomatic PVD , not amenable to endovascular intervention.  Was rec to continue with Dr. Unk Lightning Preventive care: Strongly declines flu shot, benefits discussed. RTC 4 to 6 months.

## 2022-11-19 MED ORDER — LEVOTHYROXINE SODIUM 137 MCG PO TABS: 137.0000 ug | ORAL_TABLET | Freq: Every day | ORAL | 0 refills | Status: DC

## 2022-11-19 NOTE — Addendum Note (Signed)
Addended byDamita Dunnings D on: 11/19/2022 01:43 PM   Modules accepted: Orders

## 2022-11-27 DIAGNOSIS — Z23 Encounter for immunization: Secondary | ICD-10-CM | POA: Diagnosis not present

## 2022-12-07 ENCOUNTER — Other Ambulatory Visit: Payer: Self-pay | Admitting: Podiatry

## 2022-12-08 ENCOUNTER — Other Ambulatory Visit: Payer: Self-pay

## 2022-12-08 MED ORDER — LEVOTHYROXINE SODIUM 137 MCG PO TABS: 137.0000 ug | ORAL_TABLET | Freq: Every day | ORAL | 0 refills | Status: DC

## 2022-12-10 ENCOUNTER — Ambulatory Visit (INDEPENDENT_AMBULATORY_CARE_PROVIDER_SITE_OTHER): Payer: Medicare Other

## 2022-12-10 DIAGNOSIS — I495 Sick sinus syndrome: Secondary | ICD-10-CM | POA: Diagnosis not present

## 2022-12-10 LAB — CUP PACEART REMOTE DEVICE CHECK
Battery Remaining Longevity: 123 mo
Battery Voltage: 3 V
Brady Statistic AP VP Percent: 35.53 %
Brady Statistic AP VS Percent: 45.43 %
Brady Statistic AS VP Percent: 1.99 %
Brady Statistic AS VS Percent: 17.03 %
Brady Statistic RA Percent Paced: 64.07 %
Brady Statistic RV Percent Paced: 44.58 %
Date Time Interrogation Session: 20240103215421
Implantable Lead Connection Status: 753985
Implantable Lead Connection Status: 753985
Implantable Lead Implant Date: 20210106
Implantable Lead Implant Date: 20210106
Implantable Lead Location: 753859
Implantable Lead Location: 753860
Implantable Lead Model: 3830
Implantable Lead Model: 5076
Implantable Pulse Generator Implant Date: 20210106
Lead Channel Impedance Value: 304 Ohm
Lead Channel Impedance Value: 361 Ohm
Lead Channel Impedance Value: 380 Ohm
Lead Channel Impedance Value: 532 Ohm
Lead Channel Pacing Threshold Amplitude: 0.625 V
Lead Channel Pacing Threshold Amplitude: 0.75 V
Lead Channel Pacing Threshold Pulse Width: 0.4 ms
Lead Channel Pacing Threshold Pulse Width: 0.4 ms
Lead Channel Sensing Intrinsic Amplitude: 1.625 mV
Lead Channel Sensing Intrinsic Amplitude: 1.625 mV
Lead Channel Sensing Intrinsic Amplitude: 25.875 mV
Lead Channel Sensing Intrinsic Amplitude: 25.875 mV
Lead Channel Setting Pacing Amplitude: 1.5 V
Lead Channel Setting Pacing Amplitude: 2.5 V
Lead Channel Setting Pacing Pulse Width: 0.4 ms
Lead Channel Setting Sensing Sensitivity: 2 mV
Zone Setting Status: 755011
Zone Setting Status: 755011

## 2022-12-14 ENCOUNTER — Telehealth: Payer: Self-pay | Admitting: Cardiovascular Disease

## 2022-12-14 NOTE — Telephone Encounter (Signed)
Spoke with the patient and reviewed instructions for his CT scan and ablation. He will go to [place] to have labs drawn this week.

## 2022-12-14 NOTE — Telephone Encounter (Signed)
Patient states he is having a CT done on 1/15 and surgery done on 1/18.  He is not sure when he is to have his lab work done. Please advise.  He also lost his instructions on what he is to do prior to the the CT scan.

## 2022-12-17 ENCOUNTER — Encounter (HOSPITAL_COMMUNITY): Payer: Self-pay

## 2022-12-18 ENCOUNTER — Other Ambulatory Visit (HOSPITAL_COMMUNITY): Payer: Self-pay | Admitting: Emergency Medicine

## 2022-12-18 DIAGNOSIS — I480 Paroxysmal atrial fibrillation: Secondary | ICD-10-CM | POA: Diagnosis not present

## 2022-12-19 LAB — BASIC METABOLIC PANEL
BUN/Creatinine Ratio: 9 — ABNORMAL LOW (ref 10–24)
BUN: 14 mg/dL (ref 8–27)
CO2: 22 mmol/L (ref 20–29)
Calcium: 9.3 mg/dL (ref 8.6–10.2)
Chloride: 102 mmol/L (ref 96–106)
Creatinine, Ser: 1.55 mg/dL — ABNORMAL HIGH (ref 0.76–1.27)
Glucose: 84 mg/dL (ref 70–99)
Potassium: 4.5 mmol/L (ref 3.5–5.2)
Sodium: 138 mmol/L (ref 134–144)
eGFR: 46 mL/min/{1.73_m2} — ABNORMAL LOW (ref >59)

## 2022-12-19 LAB — CBC WITH DIFFERENTIAL/PLATELET
Basophils Absolute: 0 10*3/uL (ref 0.0–0.2)
Basos: 1 %
EOS (ABSOLUTE): 0.2 10*3/uL (ref 0.0–0.4)
Eos: 4 %
Hematocrit: 48.9 % (ref 37.5–51.0)
Hemoglobin: 15.8 g/dL (ref 13.0–17.7)
Immature Grans (Abs): 0 10*3/uL (ref 0.0–0.1)
Immature Granulocytes: 0 %
Lymphocytes Absolute: 1.9 10*3/uL (ref 0.7–3.1)
Lymphs: 32 %
MCH: 27.8 pg (ref 26.6–33.0)
MCHC: 32.3 g/dL (ref 31.5–35.7)
MCV: 86 fL (ref 79–97)
Monocytes Absolute: 0.6 10*3/uL (ref 0.1–0.9)
Monocytes: 11 %
Neutrophils Absolute: 3.2 10*3/uL (ref 1.4–7.0)
Neutrophils: 52 %
Platelets: 142 10*3/uL — ABNORMAL LOW (ref 150–450)
RBC: 5.68 x10E6/uL (ref 4.14–5.80)
RDW: 14.1 % (ref 11.6–15.4)
WBC: 6.1 10*3/uL (ref 3.4–10.8)

## 2022-12-21 ENCOUNTER — Encounter (HOSPITAL_COMMUNITY): Payer: Self-pay

## 2022-12-21 ENCOUNTER — Ambulatory Visit (HOSPITAL_COMMUNITY)
Admission: RE | Admit: 2022-12-21 | Discharge: 2022-12-21 | Disposition: A | Discharge status: Home / Self Care | Payer: Medicare Other | Source: Ambulatory Visit | Attending: Cardiovascular Disease | Admitting: Cardiovascular Disease

## 2022-12-21 DIAGNOSIS — I480 Paroxysmal atrial fibrillation: Secondary | ICD-10-CM | POA: Diagnosis not present

## 2022-12-21 MED ORDER — IOHEXOL 350 MG/ML SOLN
95.0000 mL | Freq: Once | INTRAVENOUS | Status: AC | PRN
  Administered 2022-12-21: 95 mL via INTRAVENOUS

## 2022-12-23 ENCOUNTER — Telehealth: Payer: Self-pay | Admitting: Cardiology

## 2022-12-23 NOTE — Pre-Procedure Instructions (Signed)
Attempted to call patient regarding procedure instructions.  Left voice mail on the following items: Arrival time 0515 Nothing to eat or drink after midnight No meds AM of procedure Responsible person to drive you home and stay with you for 24 hrs  Have you missed any doses of anti-coagulant Xarelto- if you have missed any doses please let the office know right away

## 2022-12-23 NOTE — Telephone Encounter (Signed)
Attempted to call patient, left message for patient to call back to office.   

## 2022-12-23 NOTE — Telephone Encounter (Signed)
Pt c/o medication issue:  1. Name of Medication: XARELTO 15 MG TABS tablet   2. How are you currently taking this medication (dosage and times per day)? As prescribed   3. Are you having a reaction (difficulty breathing--STAT)? No   4. What is your medication issue? Patient is calling stating the pharmacy advised this medication cost over $340. He is requesting a callback to discuss eligibility for assistance program or cheaper alternatives. Please advise.

## 2022-12-24 ENCOUNTER — Ambulatory Visit (HOSPITAL_BASED_OUTPATIENT_CLINIC_OR_DEPARTMENT_OTHER): Payer: Medicare Other | Admitting: Anesthesiology

## 2022-12-24 ENCOUNTER — Ambulatory Visit (HOSPITAL_COMMUNITY)
Admission: RE | Admit: 2022-12-24 | Discharge: 2022-12-24 | Disposition: A | Discharge status: Home / Self Care | Payer: Medicare Other | Attending: Cardiovascular Disease | Admitting: Cardiovascular Disease

## 2022-12-24 ENCOUNTER — Encounter (HOSPITAL_COMMUNITY)
Admission: RE | Disposition: A | Discharge status: Home / Self Care | Payer: Self-pay | Source: Home / Self Care | Attending: Cardiovascular Disease

## 2022-12-24 ENCOUNTER — Other Ambulatory Visit: Payer: Self-pay

## 2022-12-24 ENCOUNTER — Ambulatory Visit (HOSPITAL_COMMUNITY): Payer: Medicare Other | Admitting: Anesthesiology

## 2022-12-24 DIAGNOSIS — I129 Hypertensive chronic kidney disease with stage 1 through stage 4 chronic kidney disease, or unspecified chronic kidney disease: Secondary | ICD-10-CM | POA: Diagnosis not present

## 2022-12-24 DIAGNOSIS — Z8249 Family history of ischemic heart disease and other diseases of the circulatory system: Secondary | ICD-10-CM | POA: Diagnosis not present

## 2022-12-24 DIAGNOSIS — Z8679 Personal history of other diseases of the circulatory system: Secondary | ICD-10-CM | POA: Insufficient documentation

## 2022-12-24 DIAGNOSIS — K219 Gastro-esophageal reflux disease without esophagitis: Secondary | ICD-10-CM | POA: Insufficient documentation

## 2022-12-24 DIAGNOSIS — Z7901 Long term (current) use of anticoagulants: Secondary | ICD-10-CM | POA: Insufficient documentation

## 2022-12-24 DIAGNOSIS — I4891 Unspecified atrial fibrillation: Secondary | ICD-10-CM

## 2022-12-24 DIAGNOSIS — N189 Chronic kidney disease, unspecified: Secondary | ICD-10-CM | POA: Diagnosis not present

## 2022-12-24 DIAGNOSIS — I480 Paroxysmal atrial fibrillation: Secondary | ICD-10-CM | POA: Diagnosis not present

## 2022-12-24 DIAGNOSIS — Z87891 Personal history of nicotine dependence: Secondary | ICD-10-CM | POA: Insufficient documentation

## 2022-12-24 DIAGNOSIS — E039 Hypothyroidism, unspecified: Secondary | ICD-10-CM | POA: Insufficient documentation

## 2022-12-24 DIAGNOSIS — D6869 Other thrombophilia: Secondary | ICD-10-CM | POA: Diagnosis not present

## 2022-12-24 HISTORY — PX: ATRIAL FIBRILLATION ABLATION: EP1191

## 2022-12-24 SURGERY — ATRIAL FIBRILLATION ABLATION
Anesthesia: General

## 2022-12-24 MED ORDER — SODIUM CHLORIDE 0.9% FLUSH: 3.0000 mL | INTRAVENOUS | Status: DC | PRN

## 2022-12-24 MED ORDER — ONDANSETRON HCL 4 MG/2ML IJ SOLN: 4.0000 mg | Freq: Four times a day (QID) | INTRAMUSCULAR | Status: DC | PRN

## 2022-12-24 MED ORDER — DOBUTAMINE INFUSION FOR EP/ECHO/NUC (1000 MCG/ML)
INTRAVENOUS | Status: DC | PRN
  Administered 2022-12-24: 20 ug/kg/min via INTRAVENOUS

## 2022-12-24 MED ORDER — ACETAMINOPHEN 325 MG PO TABS
650.0000 mg | ORAL_TABLET | ORAL | Status: DC | PRN
  Administered 2022-12-24: 650 mg via ORAL

## 2022-12-24 MED ORDER — SUGAMMADEX SODIUM 200 MG/2ML IV SOLN
INTRAVENOUS | Status: DC | PRN
  Administered 2022-12-24 (×2): 100 mg via INTRAVENOUS

## 2022-12-24 MED ORDER — SODIUM CHLORIDE 0.9 % IV SOLN: INTRAVENOUS | Status: DC

## 2022-12-24 MED ORDER — ACETAMINOPHEN 325 MG PO TABS
ORAL_TABLET | ORAL | Status: AC
  Filled 2022-12-24: qty 2

## 2022-12-24 MED ORDER — FAMOTIDINE IN NACL 20-0.9 MG/50ML-% IV SOLN
20.0000 mg | Freq: Once | INTRAVENOUS | Status: AC
  Administered 2022-12-24: 20 mg via INTRAVENOUS
  Filled 2022-12-24: qty 50

## 2022-12-24 MED ORDER — LIDOCAINE 2% (20 MG/ML) 5 ML SYRINGE
INTRAMUSCULAR | Status: DC | PRN
  Administered 2022-12-24: 80 mg via INTRAVENOUS

## 2022-12-24 MED ORDER — HEPARIN SODIUM (PORCINE) 1000 UNIT/ML IJ SOLN
INTRAMUSCULAR | Status: DC | PRN
  Administered 2022-12-24: 16000 [IU] via INTRAVENOUS

## 2022-12-24 MED ORDER — HEPARIN (PORCINE) IN NACL 1000-0.9 UT/500ML-% IV SOLN
INTRAVENOUS | Status: DC | PRN
  Administered 2022-12-24 (×4): 500 mL

## 2022-12-24 MED ORDER — PROPOFOL 10 MG/ML IV BOLUS
INTRAVENOUS | Status: DC | PRN
  Administered 2022-12-24: 40 mg via INTRAVENOUS
  Administered 2022-12-24: 160 mg via INTRAVENOUS

## 2022-12-24 MED ORDER — COLCHICINE 0.6 MG PO TABS: 0.6000 mg | ORAL_TABLET | Freq: Two times a day (BID) | ORAL | 0 refills | Status: DC

## 2022-12-24 MED ORDER — DEXAMETHASONE SODIUM PHOSPHATE 10 MG/ML IJ SOLN
INTRAMUSCULAR | Status: DC | PRN
  Administered 2022-12-24: 10 mg via INTRAVENOUS

## 2022-12-24 MED ORDER — ROCURONIUM BROMIDE 10 MG/ML (PF) SYRINGE
PREFILLED_SYRINGE | INTRAVENOUS | Status: DC | PRN
  Administered 2022-12-24: 60 mg via INTRAVENOUS

## 2022-12-24 MED ORDER — PROTAMINE SULFATE 10 MG/ML IV SOLN
INTRAVENOUS | Status: DC | PRN
  Administered 2022-12-24: 50 mg via INTRAVENOUS

## 2022-12-24 MED ORDER — FENTANYL CITRATE (PF) 100 MCG/2ML IJ SOLN
INTRAMUSCULAR | Status: DC | PRN
  Administered 2022-12-24 (×2): 50 ug via INTRAVENOUS

## 2022-12-24 MED ORDER — HEPARIN SODIUM (PORCINE) 1000 UNIT/ML IJ SOLN
INTRAMUSCULAR | Status: AC
  Filled 2022-12-24: qty 10

## 2022-12-24 MED ORDER — ONDANSETRON HCL 4 MG/2ML IJ SOLN
INTRAMUSCULAR | Status: DC | PRN
  Administered 2022-12-24: 4 mg via INTRAVENOUS

## 2022-12-24 MED ORDER — SODIUM CHLORIDE 0.9 % IV SOLN: 250.0000 mL | INTRAVENOUS | Status: DC | PRN

## 2022-12-24 MED ORDER — SUCCINYLCHOLINE CHLORIDE 200 MG/10ML IV SOSY
PREFILLED_SYRINGE | INTRAVENOUS | Status: DC | PRN
  Administered 2022-12-24: 160 mg via INTRAVENOUS

## 2022-12-24 MED ORDER — PHENYLEPHRINE HCL-NACL 20-0.9 MG/250ML-% IV SOLN
INTRAVENOUS | Status: DC | PRN
  Administered 2022-12-24: 25 ug/min via INTRAVENOUS

## 2022-12-24 MED ORDER — SODIUM CHLORIDE 0.9% FLUSH: 3.0000 mL | Freq: Two times a day (BID) | INTRAVENOUS | Status: DC

## 2022-12-24 MED ORDER — DOBUTAMINE INFUSION FOR EP/ECHO/NUC (1000 MCG/ML)
INTRAVENOUS | Status: AC
  Filled 2022-12-24: qty 250

## 2022-12-24 MED ORDER — PANTOPRAZOLE SODIUM 40 MG PO TBEC: 40.0000 mg | DELAYED_RELEASE_TABLET | Freq: Every day | ORAL | 0 refills | Status: DC

## 2022-12-24 MED ORDER — HEPARIN SODIUM (PORCINE) 1000 UNIT/ML IJ SOLN
INTRAMUSCULAR | Status: DC | PRN
  Administered 2022-12-24: 1000 [IU] via INTRAVENOUS

## 2022-12-24 MED ORDER — DOFETILIDE 250 MCG PO CAPS
250.0000 ug | ORAL_CAPSULE | Freq: Two times a day (BID) | ORAL | Status: DC
  Administered 2022-12-24: 250 ug via ORAL
  Filled 2022-12-24: qty 1

## 2022-12-24 SURGICAL SUPPLY — 18 items
BAG SNAP BAND KOVER 36X36 (MISCELLANEOUS) IMPLANT
CATH ABLAT QDOT MICRO BI TC DF (CATHETERS) IMPLANT
CATH OCTARAY 2.0 F 3-3-3-3-3 (CATHETERS) IMPLANT
CATH PIGTAIL STEERABLE D1 8.7 (WIRE) IMPLANT
CATH S-M CIRCA TEMP PROBE (CATHETERS) IMPLANT
CATH SOUNDSTAR ECO 8FR (CATHETERS) IMPLANT
CATH WEBSTER BI DIR CS D-F CRV (CATHETERS) IMPLANT
CLOSURE PERCLOSE PROSTYLE (VASCULAR PRODUCTS) IMPLANT
COVER SWIFTLINK CONNECTOR (BAG) ×1 IMPLANT
DEVICE CLOSURE MYNXGRIP 6/7F (Vascular Products) IMPLANT
PACK EP LATEX FREE (CUSTOM PROCEDURE TRAY) ×1
PACK EP LF (CUSTOM PROCEDURE TRAY) ×1 IMPLANT
PAD DEFIB RADIO PHYSIO CONN (PAD) ×1 IMPLANT
PATCH CARTO3 (PAD) IMPLANT
SHEATH CARTO VIZIGO MED CURVE (SHEATH) IMPLANT
SHEATH PINNACLE 8F 10CM (SHEATH) IMPLANT
SHEATH PINNACLE 9F 10CM (SHEATH) IMPLANT
TUBING SMART ABLATE COOLFLOW (TUBING) IMPLANT

## 2022-12-24 NOTE — Telephone Encounter (Signed)
Reviewing patient chart.  Patient should be taking  Xarelto 20 mg  not 15 mg    Calling to see if patient had any changes.  And discuss with patient options

## 2022-12-24 NOTE — Anesthesia Preprocedure Evaluation (Signed)
Anesthesia Evaluation  Patient identified by MRN, date of birth, ID band Patient awake    Reviewed: Allergy & Precautions, NPO status , Patient's Chart, lab work & pertinent test results  History of Anesthesia Complications Negative for: history of anesthetic complications  Airway Mallampati: III  TM Distance: >3 FB Neck ROM: Full    Dental  (+) Dental Advisory Given, Teeth Intact, Missing,    Pulmonary neg shortness of breath, sleep apnea and Continuous Positive Airway Pressure Ventilation , neg COPD, neg recent URI, former smoker   breath sounds clear to auscultation       Cardiovascular hypertension, Pt. on medications (-) angina (-) Past MI and (-) CHF + dysrhythmias Atrial Fibrillation + pacemaker  Rhythm:Regular   1. Left ventricular ejection fraction, by visual estimation, is 55 to  60%. The left ventricle has normal function. There is no increased left  ventricular wall thickness.   2. Left ventricular diastolic parameters are consistent with Grade I  diastolic dysfunction (impaired relaxation).   3. The left ventricle has no regional wall motion abnormalities.   4. Global right ventricle has normal systolc function.The right  ventricular size is normal. no increase in right ventricular wall  thickness.   5. The mitral valve is normal in structure. No evidence of mitral valve  regurgitation. No evidence of mitral stenosis.   6. The tricuspid valve was normal in structure. Tricuspid valve  regurgitation is not demonstrated.   7. Tricuspid valve regurgitation is not demonstrated.   8. Mild aortic valve sclerosis without stenosis.   9. The pulmonic valve was normal in structure.  10. The inferior vena cava is normal in size with greater than 50%  respiratory variability, suggesting right atrial pressure of 3 mmHg.     Neuro/Psych negative neurological ROS  negative psych ROS   GI/Hepatic Neg liver ROS,GERD  Medicated  and Controlled,,  Endo/Other  Hypothyroidism    Renal/GU CRFRenal diseaseLab Results      Component                Value               Date                      CREATININE               1.55 (H)            12/18/2022                Musculoskeletal  (+) Arthritis ,    Abdominal   Peds  Hematology  (+) Blood dyscrasia Lab Results      Component                Value               Date                      WBC                      6.1                 12/18/2022                HGB                      15.8  12/18/2022                HCT                      48.9                12/18/2022                MCV                      86                  12/18/2022                PLT                      142 (L)             12/18/2022             xarelto   Anesthesia Other Findings   Reproductive/Obstetrics                              Anesthesia Physical Anesthesia Plan  ASA: 3  Anesthesia Plan: General   Post-op Pain Management: Minimal or no pain anticipated   Induction: Intravenous  PONV Risk Score and Plan: 2 and Ondansetron and Dexamethasone  Airway Management Planned: Oral ETT  Additional Equipment: None  Intra-op Plan:   Post-operative Plan: Extubation in OR  Informed Consent: I have reviewed the patients History and Physical, chart, labs and discussed the procedure including the risks, benefits and alternatives for the proposed anesthesia with the patient or authorized representative who has indicated his/her understanding and acceptance.     Dental advisory given  Plan Discussed with: CRNA  Anesthesia Plan Comments:          Anesthesia Quick Evaluation

## 2022-12-24 NOTE — Progress Notes (Signed)
Per Jonni Sanger / Dr. Myles Gip patient should take xarelto at dinner tonight and continue taking xarelto at dinner time. Patient has been instructed.

## 2022-12-24 NOTE — H&P (Signed)
Electrophysiology Office Note:    Date:  12/24/2022   ID:  KEVIS QU, DOB 1946/11/23, MRN [id number]  PCP:  Colon [facility], [place] [facility] Cardiologist:  Minus Breeding, MD Electrophysiologist:  Cristopher Peru, MD     Referring MD: No ref. provider found   Chief complaint: a fib is getting worse  History of Present Illness:    Martin Rubio is a 77 y.o. male with a hx of CHB and medtronic pacemaker placed in 2021.  He was diagnosed with AF in 2020. He has been managed with dofetilide but has been having increased burden of AF and is quite symptomatic with fatigue and palpitations. Episodes are occurring daily, and this is corroborated by AF trends on his device.  He presents today for AF ablation. I reviewed the patient's CT and labs. There was no LAA thrombus. he  has not missed any doses of anticoagulation, and he took his dose last night. There have been no changes in the patient's diagnoses, medications, or condition since our recent clinic visit.   He denies chest pain, syncope, presyncope.    Past Medical History:  Diagnosis Date   Bronchitis    Diverticulosis    FH: BPH (benign prostatic hypertrophy)    History of Graves' disease 01/19/2018   S/p ablation   HTN (hypertension)    Hyperlipidemia    Obesity    Psoriatic arthritis ([place])    Rheumatoid arthritis ([place]) 08/07/2014   Sleep apnea    CPAP   Thyroid disease     Past Surgical History:  Procedure Laterality Date   ABDOMINAL AORTOGRAM W/LOWER EXTREMITY N/A 01/14/2022   Procedure: ABDOMINAL AORTOGRAM W/LOWER EXTREMITY;  Surgeon: Wellington Hampshire, MD;  Location: [facility];  Service: Cardiovascular;  Laterality: N/A;   INGUINAL HERNIA REPAIR     bilateral   KNEE SURGERY     bilateral arthroscopic   l foot surgery Left 12/2020   PACEMAKER IMPLANT N/A 12/13/2019   Procedure: PACEMAKER IMPLANT;  Surgeon: Evans Lance, MD;  Location: [facility];  Service:  Cardiovascular;  Laterality: N/A;   TRANSURETHRAL RESECTION OF PROSTATE     UMBILICAL HERNIA REPAIR      Current Medications: Current Meds  Medication Sig   acetaminophen (TYLENOL) 500 MG tablet Take 1,000 mg by mouth every 6 (six) hours as needed (pain.).   amLODipine (NORVASC) 5 MG tablet Take 1 tablet (5 mg total) by mouth daily. (Patient taking differently: Take 5 mg by mouth daily as needed (elevated blood pressure.).)   cholecalciferol (VITAMIN D3) 25 MCG (1000 UNIT) tablet Take 2,000 Units by mouth daily in the afternoon.   Cyanocobalamin (VITAMIN B 12) 500 MCG TABS Take 1,000 mcg by mouth daily in the afternoon.   diclofenac Sodium (VOLTAREN) 1 % GEL Apply 4 g topically in the morning and at bedtime. (Patient taking differently: Apply 4 g topically 2 (two) times daily as needed (knee pain.).)   dofetilide (TIKOSYN) 250 MCG capsule TAKE ONE CAPSULE BY MOUTH TWICE A DAY   ENBREL SURECLICK 50 MG/ML injection Inject 50 mg into the skin once a week. Friday or Saturday   ferrous fumarate (HEMOCYTE - 106 MG FE) 325 (106 Fe) MG TABS tablet Take 1 tablet (106 mg of iron total) by mouth 2 (two) times daily. (Patient taking differently: Take 1 tablet by mouth daily.)   fluticasone (FLONASE) 50 MCG/ACT nasal spray Place 1 spray into both nostrils as needed for  allergies.   folic acid (FOLVITE) 811 MCG tablet Take 800 mcg by mouth daily in the afternoon.   hydrOXYzine (VISTARIL) 25 MG capsule TAKE ONE CAPSULE BY MOUTH EVERY 8 HOURS AS NEEDED   Krill Oil 350 MG CAPS Take 350 mg by mouth every Monday, Tuesday, Wednesday, Thursday, and Friday.   levothyroxine (SYNTHROID) 137 MCG tablet Take 1 tablet (137 mcg total) by mouth daily before breakfast.   losartan (COZAAR) 50 MG tablet TAKE 1 TABLET (50 MG TOTAL) BY MOUTH IN THE MORNING AND AT BEDTIME. (Patient taking differently: Take 50 mg by mouth 2 (two) times daily as needed (elevated blood pressure.).)   meclizine (ANTIVERT) 25 MG tablet Take 25 mg by  mouth 3 (three) times daily as needed for dizziness.   Melatonin 5 MG TABS Take 5 mg by mouth at bedtime.   omeprazole (PRILOSEC) 20 MG capsule Take 20 mg by mouth in the morning.   OVER THE COUNTER MEDICATION Take 2 capsules by mouth daily in the afternoon. Nervprin - Healthy Nerve Support - Peripheral Neuropathy Relief   oxybutynin (DITROPAN-XL) 10 MG 24 hr tablet Take 10 mg by mouth at bedtime.   PRALUENT 150 MG/ML SOAJ INJECT 150 MG INTO THE SKIN EVERY 14 DAYS   pregabalin (LYRICA) 50 MG capsule Take 50 mg by mouth at bedtime.   rivaroxaban (XARELTO) 20 MG TABS tablet Take 1 tablet (20 mg total) by mouth daily with supper. Dose change, please call [phone]   tadalafil (CIALIS) 20 MG tablet Take 20 mg by mouth daily as needed for erectile dysfunction.   tamsulosin (FLOMAX) 0.4 MG CAPS capsule Take 0.4 mg by mouth at bedtime.   triamcinolone cream (KENALOG) 0.1 % Apply 1 application topically daily as needed for itching.   XARELTO 15 MG TABS tablet Take 15 mg by mouth in the morning.     Allergies:   Codeine, Nsaids, Prednisone, Statins, and Sulfa antibiotics   Social History   Socioeconomic History   Marital status: Married    Spouse name: Not on file   Number of children: 2   Years of education: Not on file   Highest education level: Not on file  Occupational History   Occupation: Retired    Fish farm manager: [employer]  Tobacco Use   Smoking status: Former    Types: Cigarettes   Smokeless tobacco: Never   Tobacco comments:    Quit 46 years ago.  Vaping Use   Vaping Use: Never used  Substance and Sexual Activity   Alcohol use: No   Drug use: No   Sexual activity: Not on file  Other Topics Concern   Not on file  Social History Narrative   Lives with wife.   Social Determinants of Health   Financial Resource Strain: Low Risk  (01/26/2022)   Overall Financial Resource Strain (CARDIA)    Difficulty of Paying Living Expenses: Not hard at all  Food Insecurity: No Food  Insecurity (01/26/2022)   Hunger Vital Sign    Worried About Running Out of Food in the Last Year: Never true    Ran Out of Food in the Last Year: Never true  Transportation Needs: No Transportation Needs (01/26/2022)   PRAPARE - Hydrologist (Medical): No    Lack of Transportation (Non-Medical): No  Physical Activity: Inactive (01/26/2022)   Exercise Vital Sign    Days of Exercise per Week: 0 days    Minutes of Exercise per Session: 0 min  Stress: No  Stress Concern Present (01/26/2022)   [place]    Feeling of Stress : Not at all  Social Connections: Moderately Integrated (01/26/2022)   Social Connection and Isolation Panel [NHANES]    Frequency of Communication with Friends and Family: Three times a week    Frequency of Social Gatherings with Friends and Family: Twice a week    Attends Religious Services: More than 4 times per year    Active Member of [employer] or Organizations: No    Attends Music therapist: Never    Marital Status: Married     Family History: The patient's family history includes CAD (age of onset: 69) in his mother; CAD (age of onset: 32) in his father; Diabetes in his brother. There is no history of Prostate cancer or Colon cancer.  ROS:   Please see the history of present illness.    All other systems reviewed and are negative.  EKGs/Labs/Other Studies Reviewed Today:      EKG:  Last EKG results: today -- A-sensed, V-paced   Recent Labs: 11/17/2022: TSH 0.31 12/18/2022: BUN 14; Creatinine, Ser 1.55; Hemoglobin 15.8; Platelets 142; Potassium 4.5; Sodium 138     Physical Exam:    VS:  Pulse 71   Temp (!) 97.1 F (36.2 C) (Temporal)   Resp 16   Ht '5\' 10"'$  (1.778 m)   Wt 94.3 kg   SpO2 97%   BMI 29.84 kg/m     Wt Readings from Last 3 Encounters:  12/24/22 94.3 kg  11/17/22 96.3 kg  11/02/22 97.1 kg     GEN: Well nourished, well developed  in no acute distress CARDIAC: RRR, no murmurs, rubs, gallops RESPIRATORY:  Normal work of breathing MUSCULOSKELETAL: no edema    ASSESSMENT & PLAN:    Paroxysmal atrial fibrillation: symptomatic, increasing burden based upon symptoms and device logs. Has failed dofetilide. He presents today for ablation. Signed consent was obtained. We discussed risks and benefits at previous office visit. We will proceed. Secondary hypercoagulable state: continue xarelto        Medication Adjustments/Labs and Tests Ordered: Current medicines are reviewed at length with the patient today.  Concerns regarding medicines are outlined above.  Orders Placed This Encounter  Procedures   Informed Consent Details: Physician/Practitioner Attestation; Transcribe to consent form and obtain patient signature   Initiate Pre-op Protocol   Void on call to EP Lab   Confirm CBC and BMP (or CMP) results within 7 days for inpatient and 30 days for outpatient:   Clip right and left femoral area PM before surgery   Clip right internal jugular area PM before surgery   Pre-admission testing diagnosis   EP STUDY   Insert peripheral IV   Meds ordered this encounter  Medications   0.9 %  sodium chloride infusion     Signed, Melida Quitter, MD  12/24/2022 7:08 AM    [facility]

## 2022-12-24 NOTE — Transfer of Care (Signed)
Immediate Anesthesia Transfer of Care Note  Patient: Martin Rubio  Procedure(s) Performed: ATRIAL FIBRILLATION ABLATION  Patient Location: Cath Lab  Anesthesia Type:General  Level of Consciousness: awake, alert , and oriented  Airway & Oxygen Therapy: Patient Spontanous Breathing and Patient connected to nasal cannula oxygen  Post-op Assessment: Report given to RN and Post -op Vital signs reviewed and stable  Post vital signs: Reviewed and stable  Last Vitals:  Vitals Value Taken Time  BP 111/48 12/24/22 0948  Temp    Pulse 64 12/24/22 0949  Resp 16 12/24/22 0949  SpO2 95 % 12/24/22 0949  Vitals shown include unvalidated device data.  Last Pain:  Vitals:   12/24/22 0608  TempSrc:   PainSc: 0-No pain         Complications: There were no known notable events for this encounter.

## 2022-12-24 NOTE — Discharge Instructions (Signed)

## 2022-12-24 NOTE — Anesthesia Procedure Notes (Signed)
Procedure Name: Intubation Date/Time: 12/24/2022 7:54 AM  Performed by: Valda Favia, CRNAPre-anesthesia Checklist: Patient identified, Emergency Drugs available, Suction available and Patient being monitored Patient Re-evaluated:Patient Re-evaluated prior to induction Oxygen Delivery Method: Circle System Utilized Preoxygenation: Pre-oxygenation with 100% oxygen Induction Type: IV induction, Rapid sequence and Cricoid Pressure applied Laryngoscope Size: Mac and 4 Grade View: Grade I Tube type: Oral Tube size: 7.5 mm Number of attempts: 1 Airway Equipment and Method: Stylet Placement Confirmation: ETT inserted through vocal cords under direct vision, positive ETCO2 and breath sounds checked- equal and bilateral Secured at: 22 cm Tube secured with: Tape Dental Injury: Teeth and Oropharynx as per pre-operative assessment

## 2022-12-25 ENCOUNTER — Encounter (HOSPITAL_COMMUNITY): Payer: Self-pay | Admitting: Cardiovascular Disease

## 2022-12-25 ENCOUNTER — Other Ambulatory Visit: Payer: Self-pay | Admitting: *Deleted

## 2022-12-25 DIAGNOSIS — I480 Paroxysmal atrial fibrillation: Secondary | ICD-10-CM

## 2022-12-25 LAB — POCT ACTIVATED CLOTTING TIME: Activated Clotting Time: 336 seconds

## 2022-12-25 MED ORDER — RIVAROXABAN 20 MG PO TABS: 20.0000 mg | ORAL_TABLET | Freq: Every day | ORAL | 0 refills | Status: DC

## 2022-12-25 NOTE — Telephone Encounter (Addendum)
Prescription refill request for Xarelto received.   Indication: afib  Last office visit: 11/02/2022 Weight: 94.3 kg  Age: 76 Scr: 1.55 (12/18/2022) CrCl:  52 ml/min  Pt called and stated he did not know if he took if Xarelto last night.   Pt stated that he usually takes it in the morning.   Pt stated he is taking Xarelto '15mg'$  daily. He has Xarelto '15mg'$  tablets on hand. Informed pt that based on our documentation he should be on Xarelto '20mg'$  daily. (Phone note from 12/23/2022, from Dr. Myles Gip note 11/02/2022 he was on Xarelto '20mg'$  daily, refill from 07/13/2022)  Informed pt that Xarelto should be taken once a day with the largest meal of the day. Pt stated his largest meal of the day is breakfast but can sometimes be dinner. Informed pt that Xarelto should be taken about every 24 hours ( around the same time) with food.    Informed pt that I would send in a refill for Xarelto '20mg'$  and to not take the '15mg'$  tablets.   Informed pt it was okay for him to take Xarelto '15mg'$  today (only) and then start Xaretlo '20mg'$  daily tomorrow.

## 2022-12-28 NOTE — Telephone Encounter (Signed)
Patient had a procedure 12/24/22. Discharged on xarelto 20 mg.

## 2022-12-28 NOTE — Telephone Encounter (Signed)
Attempted to call pt and LMOM for him to call anticoagulation clinic, Dr. Karel Jarvis office back-   Need to update him that Xarelto needs to be taken at night.

## 2022-12-28 NOTE — Anesthesia Postprocedure Evaluation (Signed)
Anesthesia Post Note  Patient: Martin Rubio  Procedure(s) Performed: ATRIAL FIBRILLATION ABLATION     Patient location during evaluation: Cath Lab Anesthesia Type: General Level of consciousness: awake and alert Pain management: pain level controlled Vital Signs Assessment: post-procedure vital signs reviewed and stable Respiratory status: spontaneous breathing, nonlabored ventilation and respiratory function stable Cardiovascular status: blood pressure returned to baseline and stable Postop Assessment: no apparent nausea or vomiting Anesthetic complications: no   There were no known notable events for this encounter.  Last Vitals:  Vitals:   12/24/22 1300 12/24/22 1400  BP: (!) 147/74 (!) 148/92  Pulse: 60 60  Resp: 16 17  Temp:    SpO2: 94% 94%    Last Pain:  Vitals:   12/24/22 1057  TempSrc:   PainSc: 4                  Avree Szczygiel

## 2022-12-29 ENCOUNTER — Telehealth: Payer: Self-pay | Admitting: Cardiology

## 2022-12-29 NOTE — Telephone Encounter (Signed)
LMTCB.

## 2022-12-29 NOTE — Telephone Encounter (Signed)
Pt c/o BP issue: STAT if pt c/o blurred vision, one-sided weakness or slurred speech  1. What are your last 5 BP readings?  139/84 162/99 159/101   2. Are you having any other symptoms (ex. Dizziness, headache, blurred vision, passed out)? Dizziness   3. What is your BP issue? Pt states that he had ablation done last week, but was having issues bring his BP down yesterday and he states he did take his meds, but would like a call back to go over how he is to proceed with how he takes meds. Please advise.

## 2022-12-31 NOTE — Progress Notes (Signed)
Remote pacemaker transmission.   

## 2022-12-31 NOTE — Telephone Encounter (Signed)
Left message for pt to call

## 2023-01-01 NOTE — Telephone Encounter (Signed)
LMTCB.  Regarding BP.

## 2023-01-04 NOTE — Telephone Encounter (Signed)
LMTCB x 3. No response from patient. Removed from triage list.

## 2023-01-06 ENCOUNTER — Other Ambulatory Visit: Payer: Self-pay | Admitting: Cardiology

## 2023-01-15 ENCOUNTER — Telehealth: Payer: Self-pay | Admitting: Internal Medicine

## 2023-01-15 MED ORDER — RIVAROXABAN 20 MG PO TABS: 20.0000 mg | ORAL_TABLET | Freq: Every day | ORAL | 0 refills | Status: DC

## 2023-01-15 NOTE — Telephone Encounter (Addendum)
Attempted to call pt. No answer. Per chart it looks like triage has been trying to get in touch with him as well. Will send in a new prescription for Xarelto to be taken with evening meal and sent it with a message to the pharmacy to instruct pt to take it with evening meal per Dr. Myles Gip.

## 2023-01-15 NOTE — Addendum Note (Signed)
Addended by: Johny Shock B on: 01/15/2023 12:38 PM   Modules accepted: Orders

## 2023-01-15 NOTE — Telephone Encounter (Signed)
Spoke with patient he declined AWV stating its a waste of time.

## 2023-01-20 DIAGNOSIS — N1832 Chronic kidney disease, stage 3b: Secondary | ICD-10-CM | POA: Diagnosis not present

## 2023-01-20 LAB — CBC AND DIFFERENTIAL
HCT: 46 (ref 41–53)
Hemoglobin: 15.2 (ref 13.5–17.5)
Platelets: 150 10*3/uL (ref 150–400)
WBC: 6.2

## 2023-01-20 LAB — COMPREHENSIVE METABOLIC PANEL
Albumin: 4.1 (ref 3.5–5.0)
Calcium: 9.4 (ref 8.7–10.7)
eGFR: 39

## 2023-01-20 LAB — BASIC METABOLIC PANEL
BUN: 17 (ref 4–21)
CO2: 30 — AB (ref 13–22)
Chloride: 106 (ref 99–108)
Creatinine: 1.8 — AB (ref 0.6–1.3)
Glucose: 76
Potassium: 4.3 mEq/L (ref 3.5–5.1)
Sodium: 142 (ref 137–147)

## 2023-01-20 LAB — VITAMIN D 25 HYDROXY (VIT D DEFICIENCY, FRACTURES): Vit D, 25-Hydroxy: 63

## 2023-01-20 LAB — CBC: RBC: 5.4 — AB (ref 3.87–5.11)

## 2023-01-21 ENCOUNTER — Encounter (HOSPITAL_COMMUNITY): Payer: Self-pay | Admitting: Physician Assistant

## 2023-01-21 ENCOUNTER — Ambulatory Visit (HOSPITAL_COMMUNITY)
Admission: RE | Admit: 2023-01-21 | Discharge: 2023-01-21 | Disposition: A | Discharge status: Home / Self Care | Payer: Medicare Other | Source: Ambulatory Visit | Attending: Physician Assistant | Admitting: Physician Assistant

## 2023-01-21 VITALS — BP 140/88 | HR 60 | Ht 70.0 in | Wt 219.2 lb

## 2023-01-21 DIAGNOSIS — E0500 Thyrotoxicosis with diffuse goiter without thyrotoxic crisis or storm: Secondary | ICD-10-CM | POA: Insufficient documentation

## 2023-01-21 DIAGNOSIS — I10 Essential (primary) hypertension: Secondary | ICD-10-CM | POA: Diagnosis not present

## 2023-01-21 DIAGNOSIS — I480 Paroxysmal atrial fibrillation: Secondary | ICD-10-CM | POA: Insufficient documentation

## 2023-01-21 DIAGNOSIS — Z7901 Long term (current) use of anticoagulants: Secondary | ICD-10-CM | POA: Insufficient documentation

## 2023-01-21 DIAGNOSIS — E785 Hyperlipidemia, unspecified: Secondary | ICD-10-CM | POA: Insufficient documentation

## 2023-01-21 DIAGNOSIS — D6869 Other thrombophilia: Secondary | ICD-10-CM | POA: Diagnosis not present

## 2023-01-21 DIAGNOSIS — I2510 Atherosclerotic heart disease of native coronary artery without angina pectoris: Secondary | ICD-10-CM | POA: Diagnosis not present

## 2023-01-21 DIAGNOSIS — Z950 Presence of cardiac pacemaker: Secondary | ICD-10-CM | POA: Diagnosis not present

## 2023-01-21 DIAGNOSIS — G4733 Obstructive sleep apnea (adult) (pediatric): Secondary | ICD-10-CM | POA: Diagnosis not present

## 2023-01-21 NOTE — Progress Notes (Signed)
Primary Care Physician: Colon Branch, MD Primary Cardiologist: Dr [name]  Primary Electrophysiologist: Dr [name] Referring Physician: Dr Mickel Fuchs is a 77 y.o. male with a history of Grave's disease, OSA, RA, psoriatic arthritis, HTN, HLD, CHB s/p PPM, atrial flutter, atrial fibrillation who presents for follow up in the [facility]. He was diagnosed with AF in 2020. He has been managed with dofetilide but has been having increased burden of AF and is quite symptomatic with fatigue and palpitations. Episodes are occurring daily, and this is corroborated by AF trends on his device. Patient is on Xarelto for a CHADS2VASC score of 3. Patient was evaluated by Dr Myles Gip and underwent afib ablation on 12/24/22.  On follow up today, patient reports that he has done very well since the ablation. He has not had any symptoms of afib since the procedure. He denies chest pain, swallowing pain, or groin issues.   Today, he denies symptoms of palpitations, chest pain, shortness of breath, orthopnea, PND, lower extremity edema, dizziness, presyncope, syncope, snoring, daytime somnolence, bleeding, or neurologic sequela. The patient is tolerating medications without difficulties and is otherwise without complaint today.    Atrial Fibrillation Risk Factors:  he does have symptoms or diagnosis of sleep apnea. he is compliant with CPAP therapy. he does not have a history of rheumatic fever.   he has a BMI of Body mass index is 31.45 kg/m.Marland Kitchen Filed Weights   01/21/23 1106  Weight: 99.4 kg    Family History  Problem Relation Age of Onset   CAD Mother 84       CABG   CAD Father 109       Died age 21   Diabetes Brother    Prostate cancer Neg Hx    Colon cancer Neg Hx      Atrial Fibrillation Management history:  Previous antiarrhythmic drugs: dofetilide  Previous cardioversions: none Previous ablations: 12/24/22 CHADS2VASC score: 3 Anticoagulation  history: Xarelto   Past Medical History:  Diagnosis Date   Bronchitis    Diverticulosis    FH: BPH (benign prostatic hypertrophy)    History of Graves' disease 01/19/2018   S/p ablation   HTN (hypertension)    Hyperlipidemia    Obesity    Psoriatic arthritis (HCC)    Rheumatoid arthritis ([place]) 08/07/2014   Sleep apnea    CPAP   Thyroid disease    Past Surgical History:  Procedure Laterality Date   ABDOMINAL AORTOGRAM W/LOWER EXTREMITY N/A 01/14/2022   Procedure: ABDOMINAL AORTOGRAM W/LOWER EXTREMITY;  Surgeon: Wellington Hampshire, MD;  Location: [facility];  Service: Cardiovascular;  Laterality: N/A;   ATRIAL FIBRILLATION ABLATION N/A 12/24/2022   Procedure: ATRIAL FIBRILLATION ABLATION;  Surgeon: Melida Quitter, MD;  Location: [facility];  Service: Cardiovascular;  Laterality: N/A;   INGUINAL HERNIA REPAIR     bilateral   KNEE SURGERY     bilateral arthroscopic   l foot surgery Left 12/2020   PACEMAKER IMPLANT N/A 12/13/2019   Procedure: PACEMAKER IMPLANT;  Surgeon: Evans Lance, MD;  Location: [facility];  Service: Cardiovascular;  Laterality: N/A;   TRANSURETHRAL RESECTION OF PROSTATE     UMBILICAL HERNIA REPAIR      Current Outpatient Medications  Medication Sig Dispense Refill   acetaminophen (TYLENOL) 500 MG tablet Take 1,000 mg by mouth every 6 (six) hours as needed (pain.).     amLODipine (NORVASC) 5 MG tablet Take  1 tablet (5 mg total) by mouth daily. (Patient taking differently: Take 5 mg by mouth daily as needed (elevated blood pressure.).) 90 tablet 3   cholecalciferol (VITAMIN D3) 25 MCG (1000 UNIT) tablet Take 2,000 Units by mouth daily in the afternoon.     Cyanocobalamin (VITAMIN B 12) 500 MCG TABS Take 1,000 mcg by mouth daily in the afternoon.     diclofenac Sodium (VOLTAREN) 1 % GEL Apply 4 g topically in the morning and at bedtime. (Patient taking differently: Apply 4 g topically 2 (two) times daily as needed (knee pain.).) 100 g 6    dofetilide (TIKOSYN) 250 MCG capsule TAKE ONE CAPSULE BY MOUTH TWICE A DAY 180 capsule 3   ENBREL SURECLICK 50 MG/ML injection Inject 50 mg into the skin once a week. Friday or Saturday     ferrous fumarate (HEMOCYTE - 106 MG FE) 325 (106 Fe) MG TABS tablet Take 1 tablet (106 mg of iron total) by mouth 2 (two) times daily. (Patient taking differently: Take 1 tablet by mouth daily.) 180 tablet 1   fluticasone (FLONASE) 50 MCG/ACT nasal spray Place 1 spray into both nostrils as needed for allergies.     folic acid (FOLVITE) [id number] MCG tablet Take 800 mcg by mouth daily in the afternoon.     hydrOXYzine (VISTARIL) 25 MG capsule TAKE ONE CAPSULE BY MOUTH EVERY 8 HOURS AS NEEDED 60 capsule 1   Krill Oil 350 MG CAPS Take 350 mg by mouth every Monday, Tuesday, Wednesday, Thursday, and Friday.     levothyroxine (SYNTHROID) 137 MCG tablet Take 1 tablet (137 mcg total) by mouth daily before breakfast. 90 tablet 0   losartan (COZAAR) 50 MG tablet TAKE 1 TABLET (50 MG TOTAL) BY MOUTH IN THE MORNING AND AT BEDTIME. (Patient taking differently: Take 50 mg by mouth 2 (two) times daily as needed (elevated blood pressure.).) 180 tablet 3   meclizine (ANTIVERT) 25 MG tablet Take 25 mg by mouth 3 (three) times daily as needed for dizziness.     Melatonin 5 MG TABS Take 5 mg by mouth at bedtime.     OVER THE COUNTER MEDICATION Take 2 capsules by mouth daily in the afternoon. Nervprin - Healthy Nerve Support - Peripheral Neuropathy Relief     oxybutynin (DITROPAN-XL) 10 MG 24 hr tablet Take 10 mg by mouth at bedtime.     pantoprazole (PROTONIX) 40 MG tablet Take 1 tablet (40 mg total) by mouth daily. 45 tablet 0   PRALUENT 150 MG/ML SOAJ INJECT 150 MG INTO THE SKIN EVERY 14 DAYS 2 mL 11   pregabalin (LYRICA) 50 MG capsule Take 50 mg by mouth at bedtime.     rivaroxaban (XARELTO) 20 MG TABS tablet Take 1 tablet (20 mg total) by mouth daily with supper. Dose change, please call [phone] 90 tablet 0   tadalafil  (CIALIS) 20 MG tablet Take 20 mg by mouth daily as needed for erectile dysfunction.     tamsulosin (FLOMAX) 0.4 MG CAPS capsule Take 0.4 mg by mouth at bedtime.     triamcinolone cream (KENALOG) 0.1 % Apply 1 application topically daily as needed for itching.     colchicine 0.6 MG tablet Take 1 tablet (0.6 mg total) by mouth 2 (two) times daily for 5 days. 10 tablet 0   No current facility-administered medications for this encounter.    Allergies  Allergen Reactions   Codeine     Tension, nasty feeling    Nsaids Other (See Comments)  Renal insufficiency   Prednisone Other (See Comments)    Unknown/ sometimes takes with lower dose   Statins Other (See Comments)    Joint pain    Sulfa Antibiotics Other (See Comments)    Joint pain    Social History   Socioeconomic History   Marital status: Married    Spouse name: Not on file   Number of children: 2   Years of education: Not on file   Highest education level: Not on file  Occupational History   Occupation: Retired    Fish farm manager: [employer]  Tobacco Use   Smoking status: Former    Types: Cigarettes   Smokeless tobacco: Never   Tobacco comments:    Former smoker Quit 46 years ago. 01/21/23  Vaping Use   Vaping Use: Never used  Substance and Sexual Activity   Alcohol use: No   Drug use: No   Sexual activity: Not on file  Other Topics Concern   Not on file  Social History Narrative   Lives with wife.   Social Determinants of Health   Financial Resource Strain: Low Risk  (01/26/2022)   Overall Financial Resource Strain (CARDIA)    Difficulty of Paying Living Expenses: Not hard at all  Food Insecurity: No Food Insecurity (01/26/2022)   Hunger Vital Sign    Worried About Running Out of Food in the Last Year: Never true    Ran Out of Food in the Last Year: Never true  Transportation Needs: No Transportation Needs (01/26/2022)   PRAPARE - Hydrologist (Medical): No    Lack of  Transportation (Non-Medical): No  Physical Activity: Inactive (01/26/2022)   Exercise Vital Sign    Days of Exercise per Week: 0 days    Minutes of Exercise per Session: 0 min  Stress: No Stress Concern Present (01/26/2022)   [place]    Feeling of Stress : Not at all  Social Connections: Moderately Integrated (01/26/2022)   Social Connection and Isolation Panel [NHANES]    Frequency of Communication with Friends and Family: Three times a week    Frequency of Social Gatherings with Friends and Family: Twice a week    Attends Religious Services: More than 4 times per year    Active Member of [employer] or Organizations: No    Attends Archivist Meetings: Never    Marital Status: Married  Human resources officer Violence: Not At Risk (01/26/2022)   Humiliation, Afraid, Rape, and Kick questionnaire    Fear of Current or Ex-Partner: No    Emotionally Abused: No    Physically Abused: No    Sexually Abused: No     ROS- All systems are reviewed and negative except as per the HPI above.  Physical Exam: Vitals:   01/21/23 1106  BP: (!) 140/88  Pulse: 60  Weight: 99.4 kg  Height: 5' 10"$  (1.778 m)    GEN- The patient is a well appearing elderly male, alert and oriented x 3 today.   Head- normocephalic, atraumatic Eyes-  Sclera clear, conjunctiva pink Ears- hearing intact Oropharynx- clear Neck- supple  Lungs- Clear to ausculation bilaterally, normal work of breathing Heart- Regular rate and rhythm, no murmurs, rubs or gallops  GI- soft, NT, ND, + BS Extremities- no clubbing, cyanosis, or edema MS- no significant deformity or atrophy Skin- no rash or lesion Psych- euthymic mood, full affect Neuro- strength and sensation are intact  Wt Readings from Last  3 Encounters:  01/21/23 99.4 kg  12/24/22 94.3 kg  11/17/22 96.3 kg    EKG today demonstrates  A paced rhythm Vent. rate 60 BPM PR interval 270 ms QRS  duration 108 ms QT/QTcB 426/426 ms  Echo 12/16/19 demonstrated   1. Left ventricular ejection fraction, by visual estimation, is 55 to  60%. The left ventricle has normal function. There is no increased left  ventricular wall thickness.   2. Left ventricular diastolic parameters are consistent with Grade I  diastolic dysfunction (impaired relaxation).   3. The left ventricle has no regional wall motion abnormalities.   4. Global right ventricle has normal systolc function.The right  ventricular size is normal. no increase in right ventricular wall  thickness.   5. The mitral valve is normal in structure. No evidence of mitral valve  regurgitation. No evidence of mitral stenosis.   6. The tricuspid valve was normal in structure. Tricuspid valve  regurgitation is not demonstrated.   7. Tricuspid valve regurgitation is not demonstrated.   8. Mild aortic valve sclerosis without stenosis.   9. The pulmonic valve was normal in structure.  10. The inferior vena cava is normal in size with greater than 50%  respiratory variability, suggesting right atrial pressure of 3 mmHg.   Epic records are reviewed at length today  CHA2DS2-VASc Score = 3  The patient's score is based upon: CHF History: 0 HTN History: 1 Diabetes History: 0 Stroke History: 0 Vascular Disease History: 0 Age Score: 2 Gender Score: 0       ASSESSMENT AND PLAN: 1. Paroxysmal Atrial Fibrillation (ICD10:  I48.0) The patient's CHA2DS2-VASc score is 3, indicating a 3.2% annual risk of stroke.   S/p afib ablation 12/24/22 Patient appears to be maintaining SR. Continue dofetilide 250 mcg BID. QT stable. Continue Xarelto 20 mg daily with no missed doses for 3 months post ablation.  Patient looking for nutrition/exercise program and advice. Will refer to [facility].  2. Secondary Hypercoagulable State (ICD10:  D68.69) The patient is at significant risk for stroke/thromboembolism based upon his CHA2DS2-VASc Score of 3.   Continue Rivaroxaban (Xarelto).   3. HTN Stable, no changes today.  4. Obstructive sleep apnea Encouraged compliance with CPAP therapy.   5. CHB S/p PPM, followed by Dr [name] and the device clinic.  6. CAD CAC score 384 on CT On Praluent  No anginal symptoms.   Follow up with Dr Myles Gip as scheduled.    [facility] [address] [place], [place] [address] [phone] 01/21/2023 11:12 AM

## 2023-01-22 ENCOUNTER — Telehealth: Payer: Self-pay

## 2023-01-22 NOTE — Telephone Encounter (Signed)
Called re: PREP program referral, left voicemail

## 2023-01-25 ENCOUNTER — Other Ambulatory Visit: Payer: Self-pay

## 2023-01-25 DIAGNOSIS — I129 Hypertensive chronic kidney disease with stage 1 through stage 4 chronic kidney disease, or unspecified chronic kidney disease: Secondary | ICD-10-CM | POA: Diagnosis not present

## 2023-01-25 DIAGNOSIS — N183 Chronic kidney disease, stage 3 unspecified: Secondary | ICD-10-CM | POA: Diagnosis not present

## 2023-01-25 DIAGNOSIS — N1832 Chronic kidney disease, stage 3b: Secondary | ICD-10-CM | POA: Diagnosis not present

## 2023-01-25 NOTE — Telephone Encounter (Signed)
Pt's pharmacy is requesting a refill on pantoprazole. Would Dr. Myles Gip like to continue this medication? Please address

## 2023-01-25 NOTE — Telephone Encounter (Signed)
No refills indicated. 6 week prophylactic course post ablation.  If pt needs to continue this medication for other indications will need to follow up with PCP.

## 2023-01-26 ENCOUNTER — Encounter: Payer: Self-pay | Admitting: Internal Medicine

## 2023-01-26 ENCOUNTER — Telehealth: Payer: Self-pay

## 2023-01-26 NOTE — Addendum Note (Signed)
Addended by: Carter Kitten D on: 01/26/2023 09:18 AM   Modules accepted: Orders

## 2023-01-26 NOTE — Telephone Encounter (Signed)
Returned his call re PREP program, left him another voicemail.

## 2023-01-27 ENCOUNTER — Telehealth: Payer: Self-pay

## 2023-01-28 ENCOUNTER — Telehealth: Payer: Self-pay

## 2023-01-28 ENCOUNTER — Other Ambulatory Visit: Payer: Self-pay

## 2023-01-28 NOTE — Telephone Encounter (Signed)
Message was routed to appropriate staff to address

## 2023-01-28 NOTE — Telephone Encounter (Signed)
Called to discuss PREP program referral, would like to attend [facility] class in April, every M/W 3:30 or 4p; will contact in March to confirm start date and set up assessment visit.

## 2023-02-01 ENCOUNTER — Other Ambulatory Visit: Payer: Self-pay

## 2023-02-01 NOTE — Telephone Encounter (Signed)
Entry error

## 2023-02-04 ENCOUNTER — Other Ambulatory Visit: Payer: Self-pay

## 2023-02-04 MED ORDER — LEVOTHYROXINE SODIUM 137 MCG PO TABS: 137.0000 ug | ORAL_TABLET | Freq: Every day | ORAL | 1 refills | Status: DC

## 2023-02-05 ENCOUNTER — Telehealth: Payer: Self-pay | Admitting: Internal Medicine

## 2023-02-05 ENCOUNTER — Other Ambulatory Visit: Payer: Self-pay | Admitting: Cardiology

## 2023-02-05 MED ORDER — HYDROXYZINE PAMOATE 25 MG PO CAPS: ORAL_CAPSULE | ORAL | 0 refills | Status: DC

## 2023-02-05 NOTE — Telephone Encounter (Signed)
Left detailed message on machine about note below. 

## 2023-02-05 NOTE — Telephone Encounter (Addendum)
Xarelto '20mg'$  refill sent 01/15/23 due to CrCl Post afib ablation/d/c on Xarelto '20mg'$   Age-11, wt-99.4kg, Cr-1.8 on 01/20/23 & current CrCl 47.55 mL/min, last seen by Malka So on 01/21/23  Per PharmD refill xarelto '15mg'$  sent on 01/15/23 for 90 day supply.

## 2023-02-05 NOTE — Telephone Encounter (Signed)
Pt said he is unable to get his hydroxyzine refilled with foot doctor and wants to know if Dr. Larose Kells is willing to call in a refill or something similar to it for him. He is going out of town next week. Pt uses [facility] on [address]. Please call to advise if provider okay with sending in prescription for him.

## 2023-02-05 NOTE — Telephone Encounter (Signed)
Medication has been refilled consistently.  Rx sent, recommend to contact the prescribing doctor for further prescriptions.

## 2023-02-09 ENCOUNTER — Telehealth: Payer: Self-pay

## 2023-02-09 NOTE — Telephone Encounter (Signed)
PA initiated via Covermymeds; KEY: [id number].    [facility] is not able to process this request because the previous Prior Authorization Appeal Request was Denied, please contact [employer] . at [phone] or fax in expedited request to [phone] and standard request to [phone].   Pt pays cash for this medication.

## 2023-02-25 ENCOUNTER — Encounter: Payer: Self-pay | Admitting: Family Medicine

## 2023-02-25 ENCOUNTER — Ambulatory Visit (INDEPENDENT_AMBULATORY_CARE_PROVIDER_SITE_OTHER): Payer: Medicare Other | Admitting: Family Medicine

## 2023-02-25 VITALS — BP 120/80 | HR 63 | Temp 97.8°F | Resp 18 | Ht 70.0 in | Wt 213.2 lb

## 2023-02-25 DIAGNOSIS — R1013 Epigastric pain: Secondary | ICD-10-CM

## 2023-02-25 LAB — COMPREHENSIVE METABOLIC PANEL
ALT: 18 U/L (ref 0–53)
AST: 20 U/L (ref 0–37)
Albumin: 4.4 g/dL (ref 3.5–5.2)
Alkaline Phosphatase: 69 U/L (ref 39–117)
BUN: 22 mg/dL (ref 6–23)
CO2: 29 mEq/L (ref 19–32)
Calcium: 9.8 mg/dL (ref 8.4–10.5)
Chloride: 102 mEq/L (ref 96–112)
Creatinine, Ser: 1.62 mg/dL — ABNORMAL HIGH (ref 0.40–1.50)
GFR: 40.49 mL/min — ABNORMAL LOW (ref >60.00)
Glucose, Bld: 82 mg/dL (ref 70–99)
Potassium: 4.3 mEq/L (ref 3.5–5.1)
Sodium: 140 mEq/L (ref 135–145)
Total Bilirubin: 0.9 mg/dL (ref 0.2–1.2)
Total Protein: 6.8 g/dL (ref 6.0–8.3)

## 2023-02-25 LAB — H. PYLORI ANTIBODY, IGG: H Pylori IgG: NEGATIVE

## 2023-02-25 MED ORDER — PANTOPRAZOLE SODIUM 40 MG PO TBEC: 40.0000 mg | DELAYED_RELEASE_TABLET | Freq: Every day | ORAL | 3 refills | Status: DC

## 2023-02-25 NOTE — Progress Notes (Signed)
Established Patient Office Visit  Subjective   Patient ID: ABDULMALIK BIESINGER, male    DOB: 02-24-1946  Age: 77 y.o. MRN: [id number]  Chief Complaint  Patient presents with   Gastroesophageal Reflux    Pt states trying Tums, Maalox with some relief. Pt states using ginger mint reflux drops to help.     HPI  Patient Active Problem List   Diagnosis Date Noted   Dyspepsia 02/25/2023   Stage 3b chronic kidney disease ([place]) 07/15/2022   Aortic atherosclerosis ([place]) 03/18/2021   Bruit 02/09/2021   SOB (shortness of breath) 02/09/2021   Aortic valve sclerosis 02/09/2021   Hypercoagulable state due to paroxysmal atrial fibrillation ([place]) 03/05/2020   PCP notes >>>>>>>>>>>>>>> 01/10/2020   Age related osteoporosis 01/10/2020   CKD (chronic kidney disease), stage IV ([place]) 12/16/2019   Pacemaker 12/16/2019   Sick sinus syndrome ([place]) 12/16/2019   Paroxysmal atrial fibrillation ([place]) 10/26/2019   Dyslipidemia 09/14/2019   Nodule of flexor tendon sheath 04/13/2019   Hemorrhoids 06/24/2018   Low bone mass 01/19/2018   Iliac aneurysm ([place]) 06/16/2017   Hypertrophic cardiomyopathy ([place]) 06/16/2017   Essential hypertension 12/16/2016   Hyperlipidemia LDL goal <100 12/16/2016   Asthma in adult, mild intermittent, uncomplicated Q000111Q   Collapsed vertebra, not elsewhere classified, cervical region, initial encounter for fracture ([place]) 10/08/2016   Annual physical exam 10/08/2016   Neuropathy 10/08/2016   Contracture of ankle and foot joint 08/07/2014   Dysphagia 08/07/2014   Esophageal reflux 08/07/2014   Erectile dysfunction 08/07/2014   Obstructive sleep apnea 08/07/2014   Osteoarthritis 08/07/2014   Rheumatoid arthritis ([place]) 08/07/2014   Sensorineural hearing loss 08/07/2014   Tinnitus 08/07/2014   LVH (left ventricular hypertrophy) 12/25/2012   Diverticulosis 09/01/2011   White coat hypertension 07/07/2011   Hypothyroidism 06/09/2011   Postablative hypothyroidism 06/09/2011    Psoriatic arthritis ([place]) 06/09/2011   Benign prostatic hyperplasia 06/09/2011   Past Medical History:  Diagnosis Date   Bronchitis    Diverticulosis    FH: BPH (benign prostatic hypertrophy)    History of Graves' disease 01/19/2018   S/p ablation   HTN (hypertension)    Hyperlipidemia    Obesity    Psoriatic arthritis ([place])    Rheumatoid arthritis ([place]) 08/07/2014   Sleep apnea    CPAP   Thyroid disease    Past Surgical History:  Procedure Laterality Date   ABDOMINAL AORTOGRAM W/LOWER EXTREMITY N/A 01/14/2022   Procedure: ABDOMINAL AORTOGRAM W/LOWER EXTREMITY;  Surgeon: Wellington Hampshire, MD;  Location: [facility];  Service: Cardiovascular;  Laterality: N/A;   ATRIAL FIBRILLATION ABLATION N/A 12/24/2022   Procedure: ATRIAL FIBRILLATION ABLATION;  Surgeon: Melida Quitter, MD;  Location: [facility];  Service: Cardiovascular;  Laterality: N/A;   INGUINAL HERNIA REPAIR     bilateral   KNEE SURGERY     bilateral arthroscopic   l foot surgery Left 12/2020   PACEMAKER IMPLANT N/A 12/13/2019   Procedure: PACEMAKER IMPLANT;  Surgeon: Evans Lance, MD;  Location: [facility];  Service: Cardiovascular;  Laterality: N/A;   TRANSURETHRAL RESECTION OF PROSTATE     UMBILICAL HERNIA REPAIR     Social History   Tobacco Use   Smoking status: Former    Types: Cigarettes   Smokeless tobacco: Never   Tobacco comments:    Former smoker Quit 46 years ago. 01/21/23  Vaping Use   Vaping Use: Never used  Substance Use Topics   Alcohol use: No  Drug use: No   Social History   Socioeconomic History   Marital status: Married    Spouse name: Not on file   Number of children: 2   Years of education: Not on file   Highest education level: Not on file  Occupational History   Occupation: Retired    Fish farm manager: [employer]  Tobacco Use   Smoking status: Former    Types: Cigarettes   Smokeless tobacco: Never   Tobacco comments:    Former smoker Quit 46 years  ago. 01/21/23  Vaping Use   Vaping Use: Never used  Substance and Sexual Activity   Alcohol use: No   Drug use: No   Sexual activity: Not on file  Other Topics Concern   Not on file  Social History Narrative   Lives with wife.   Social Determinants of Health   Financial Resource Strain: Low Risk  (01/26/2022)   Overall Financial Resource Strain (CARDIA)    Difficulty of Paying Living Expenses: Not hard at all  Food Insecurity: No Food Insecurity (01/26/2022)   Hunger Vital Sign    Worried About Running Out of Food in the Last Year: Never true    Ran Out of Food in the Last Year: Never true  Transportation Needs: No Transportation Needs (01/26/2022)   PRAPARE - Hydrologist (Medical): No    Lack of Transportation (Non-Medical): No  Physical Activity: Inactive (01/26/2022)   Exercise Vital Sign    Days of Exercise per Week: 0 days    Minutes of Exercise per Session: 0 min  Stress: No Stress Concern Present (01/26/2022)   [place]    Feeling of Stress : Not at all  Social Connections: Moderately Integrated (01/26/2022)   Social Connection and Isolation Panel [NHANES]    Frequency of Communication with Friends and Family: Three times a week    Frequency of Social Gatherings with Friends and Family: Twice a week    Attends Religious Services: More than 4 times per year    Active Member of [employer] or Organizations: No    Attends Archivist Meetings: Never    Marital Status: Married  Human resources officer Violence: Not At Risk (01/26/2022)   Humiliation, Afraid, Rape, and Kick questionnaire    Fear of Current or Ex-Partner: No    Emotionally Abused: No    Physically Abused: No    Sexually Abused: No   Family Status  Relation Name Status   Mother  Deceased   Father  Deceased   Brother  Deceased at age 72       asbestos   MGM  Deceased   MGF  Deceased   PGM  Deceased   PGF   Deceased   Neg Hx  (Not Specified)   Family History  Problem Relation Age of Onset   CAD Mother 63       CABG   CAD Father 64       Died age 1   Diabetes Brother    Lung cancer Maternal Grandmother    Prostate cancer Neg Hx    Colon cancer Neg Hx    Allergies  Allergen Reactions   Codeine     Tension, nasty feeling    Nsaids Other (See Comments)    Renal insufficiency   Prednisone Other (See Comments)    Unknown/ sometimes takes with lower dose   Statins Other (See Comments)    Joint pain  Sulfa Antibiotics Other (See Comments)    Joint pain      Review of Systems  Constitutional:  Negative for fever and malaise/fatigue.  HENT:  Negative for congestion.   Eyes:  Negative for blurred vision.  Respiratory:  Negative for shortness of breath.   Cardiovascular:  Negative for chest pain, palpitations and leg swelling.  Gastrointestinal:  Negative for abdominal pain, blood in stool and nausea.  Genitourinary:  Negative for dysuria and frequency.  Musculoskeletal:  Negative for falls.  Skin:  Negative for rash.  Neurological:  Negative for dizziness, loss of consciousness and headaches.  Endo/Heme/Allergies:  Negative for environmental allergies.  Psychiatric/Behavioral:  Negative for depression. The patient is not nervous/anxious.       Objective:     BP 120/80 (BP Location: Left Arm, Patient Position: Sitting, Cuff Size: Normal)   Pulse 63   Temp 97.8 F (36.6 C) (Oral)   Resp 18   Ht 5\' 10"  (1.778 m)   Wt 213 lb 3.2 oz (96.7 kg)   SpO2 97%   BMI 30.59 kg/m  BP Readings from Last 3 Encounters:  02/25/23 120/80  01/21/23 (!) 140/88  12/24/22 (!) 148/92   Wt Readings from Last 3 Encounters:  02/25/23 213 lb 3.2 oz (96.7 kg)  01/21/23 219 lb 3.2 oz (99.4 kg)  12/24/22 208 lb (94.3 kg)   SpO2 Readings from Last 3 Encounters:  02/25/23 97%  12/24/22 94%  11/17/22 95%      Physical Exam Vitals and nursing note reviewed.  Constitutional:       Appearance: He is well-developed.  HENT:     Head: Normocephalic and atraumatic.  Eyes:     Pupils: Pupils are equal, round, and reactive to light.  Neck:     Thyroid: No thyromegaly.  Cardiovascular:     Rate and Rhythm: Normal rate and regular rhythm.     Heart sounds: No murmur heard. Pulmonary:     Effort: Pulmonary effort is normal. No respiratory distress.     Breath sounds: Normal breath sounds. No wheezing or rales.  Chest:     Chest wall: No tenderness.  Abdominal:     Tenderness: There is no abdominal tenderness.  Musculoskeletal:     Cervical back: Normal range of motion and neck supple.     Right hip: Tenderness present. Normal range of motion. Normal strength.     Left hip: Tenderness present. Normal range of motion. Normal strength.     Right foot: Bony tenderness present. No swelling.     Left foot: Bony tenderness present. No swelling.  Skin:    General: Skin is warm and dry.  Neurological:     Mental Status: He is alert and oriented to person, place, and time.  Psychiatric:        Behavior: Behavior normal.        Thought Content: Thought content normal.        Judgment: Judgment normal.      No results found for any visits on 02/25/23.  Last CBC Lab Results  Component Value Date   WBC 6.2 01/20/2023   HGB 15.2 01/20/2023   HCT 46 01/20/2023   MCV 86 12/18/2022   MCH 27.8 12/18/2022   RDW 14.1 12/18/2022   PLT 150 Q000111Q   Last metabolic panel Lab Results  Component Value Date   GLUCOSE 84 12/18/2022   NA 142 01/20/2023   K 4.3 01/20/2023   CL 106 01/20/2023   CO2 30 (A)  01/20/2023   BUN 17 01/20/2023   CREATININE 1.8 (A) 01/20/2023   EGFR 39 01/20/2023   CALCIUM 9.4 01/20/2023   PROT 6.8 08/08/2021   ALBUMIN 4.1 01/20/2023   BILITOT 0.6 08/08/2021   ALKPHOS 66 08/08/2021   AST 19 08/08/2021   ALT 14 08/08/2021   ANIONGAP 11 01/08/2021   Last lipids Lab Results  Component Value Date   CHOL 133 09/11/2022   HDL 42 09/11/2022    LDLCALC 63 09/11/2022   TRIG 164 (H) 09/11/2022   CHOLHDL 3.2 09/11/2022   Last hemoglobin A1c Lab Results  Component Value Date   HGBA1C 5.9 12/16/2021   Last thyroid functions Lab Results  Component Value Date   TSH 0.31 (L) 11/17/2022   Last vitamin D Lab Results  Component Value Date   VD25OH 63 01/20/2023   Last vitamin B12 and Folate Lab Results  Component Value Date   VITAMINB12 1,108 (H) 02/29/2020   FOLATE 8.7 01/09/2020      The 10-year ASCVD risk score (Arnett DK, et al., 2019) is: 29.2%    Assessment & Plan:   Problem List Items Addressed This Visit       Unprioritized   Dyspepsia - Primary    Protonix daily Refer to GI Check labs       Relevant Medications   pantoprazole (PROTONIX) 40 MG tablet   Other Relevant Orders   CBC+Platelet+Hem Review   Comprehensive metabolic panel   H. pylori antibody, IgG- [facility]   Ambulatory referral to Gastroenterology    Return if symptoms worsen or fail to improve.    Ann Held, DO

## 2023-02-25 NOTE — Patient Instructions (Signed)
Food Choices for Gastroesophageal Reflux Disease, Adult When you have gastroesophageal reflux disease (GERD), the foods you eat and your eating habits are very important. Choosing the right foods can help ease the discomfort of GERD. Consider working with a dietitian to help you make healthy food choices. What are tips for following this plan? Reading food labels Look for foods that are low in saturated fat. Foods that have less than 5% of daily value (DV) of fat and 0 g of trans fats may help with your symptoms. Cooking Cook foods using methods other than frying. This may include baking, steaming, grilling, or broiling. These are all methods that do not need a lot of fat for cooking. To add flavor, try to use herbs that are low in spice and acidity. Meal planning  Choose healthy foods that are low in fat, such as fruits, vegetables, whole grains, low-fat dairy products, lean meats, fish, and poultry. Eat frequent, small meals instead of three large meals each day. Eat your meals slowly, in a relaxed setting. Avoid bending over or lying down until 2-3 hours after eating. Limit high-fat foods such as fatty meats or fried foods. Limit your intake of fatty foods, such as oils, butter, and shortening. Avoid the following as told by your health care provider: Foods that cause symptoms. These may be different for different people. Keep a food diary to keep track of foods that cause symptoms. Alcohol. Drinking large amounts of liquid with meals. Eating meals during the 2-3 hours before bed. Lifestyle Maintain a healthy weight. Ask your health care provider what weight is healthy for you. If you need to lose weight, work with your health care provider to do so safely. Exercise for at least 30 minutes on 5 or more days each week, or as told by your health care provider. Avoid wearing clothes that fit tightly around your waist and chest. Do not use any products that contain nicotine or tobacco. These  products include cigarettes, chewing tobacco, and vaping devices, such as e-cigarettes. If you need help quitting, ask your health care provider. Sleep with the head of your bed raised. Use a wedge under the mattress or blocks under the bed frame to raise the head of the bed. Chew sugar-free gum after mealtimes. What foods should I eat?  Eat a healthy, well-balanced diet of fruits, vegetables, whole grains, low-fat dairy products, lean meats, fish, and poultry. Each person is different. Foods that may trigger symptoms in one person may not trigger any symptoms in another person. Work with your health care provider to identify foods that are safe for you. The items listed above may not be a complete list of recommended foods and beverages. Contact a dietitian for more information. What foods should I avoid? Limiting some of these foods may help manage the symptoms of GERD. Everyone is different. Consult a dietitian or your health care provider to help you identify the exact foods to avoid, if any. Fruits Any fruits prepared with added fat. Any fruits that cause symptoms. For some people this may include citrus fruits, such as oranges, grapefruit, pineapple, and lemons. Vegetables Deep-fried vegetables. French fries. Any vegetables prepared with added fat. Any vegetables that cause symptoms. For some people, this may include tomatoes and tomato products, chili peppers, onions and garlic, and horseradish. Grains Pastries or quick breads with added fat. Meats and other proteins High-fat meats, such as fatty beef or pork, hot dogs, ribs, ham, sausage, salami, and bacon. Fried meat or protein, including   fried fish and fried chicken. Nuts and nut butters, in large amounts. Dairy Whole milk and chocolate milk. Sour cream. Cream. Ice cream. Cream cheese. Milkshakes. Fats and oils Butter. Margarine. Shortening. Ghee. Beverages Coffee and tea, with or without caffeine. Carbonated beverages. Sodas. Energy  drinks. Fruit juice made with acidic fruits, such as orange or grapefruit. Tomato juice. Alcoholic drinks. Sweets and desserts Chocolate and cocoa. Donuts. Seasonings and condiments Pepper. Peppermint and spearmint. Added salt. Any condiments, herbs, or seasonings that cause symptoms. For some people, this may include curry, hot sauce, or vinegar-based salad dressings. The items listed above may not be a complete list of foods and beverages to avoid. Contact a dietitian for more information. Questions to ask your health care provider Diet and lifestyle changes are usually the first steps that are taken to manage symptoms of GERD. If diet and lifestyle changes do not improve your symptoms, talk with your health care provider about taking medicines. Where to find more information [employer] G[employer]intestinal [employer]: aboutgerd.org Summary When you have gastroesophageal reflux disease (GERD), food and lifestyle choices may be very helpful in easing the discomfort of GERD. Eat frequent, small meals instead of three large meals each day. Eat your meals slowly, in a relaxed setting. Avoid bending over or lying down until 2-3 hours after eating. Limit high-fat foods such as fatty meats or fried foods. This information is not intended to replace advice given to you by your health care provider. Make sure you discuss any questions you have with your health care provider. Document Revised: 06/03/2020 Document Reviewed: 06/03/2020 Elsevier Patient Education  2023 [employer]  

## 2023-02-25 NOTE — Assessment & Plan Note (Signed)
Protonix daily Refer to GI Check labs

## 2023-02-25 NOTE — Progress Notes (Signed)
Subjective:   By signing my name below, I, Shehryar Baig, attest that this documentation has been prepared under the direction and in the presence of Ann Held, DO. 02/25/2023   Patient ID: Martin Rubio, male    DOB: 11/11/1946, 77 y.o.   MRN: [id number]  Chief Complaint  Patient presents with   Gastroesophageal Reflux    Pt states trying Tums, Maalox with some relief. Pt states using ginger mint reflux drops to help.     Gastroesophageal Reflux He complains of coughing and heartburn.   Patient is in today for a office visit.   He complains of acid reflux for the past several days. He recently developed a cough due to his reflux. He had no recent change in his diet or medications. He is taking Mylanta, TUMs and ginger mint hard candy to help manage his symptoms and finds relief.  He reports his brother passed away a couple weeks ago at age 62 due to lung cancer caused by asbestos.   Past Medical History:  Diagnosis Date   Bronchitis    Diverticulosis    FH: BPH (benign prostatic hypertrophy)    History of Graves' disease 01/19/2018   S/p ablation   HTN (hypertension)    Hyperlipidemia    Obesity    Psoriatic arthritis ([place])    Rheumatoid arthritis ([place]) 08/07/2014   Sleep apnea    CPAP   Thyroid disease     Past Surgical History:  Procedure Laterality Date   ABDOMINAL AORTOGRAM W/LOWER EXTREMITY N/A 01/14/2022   Procedure: ABDOMINAL AORTOGRAM W/LOWER EXTREMITY;  Surgeon: Wellington Hampshire, MD;  Location: [facility];  Service: Cardiovascular;  Laterality: N/A;   ATRIAL FIBRILLATION ABLATION N/A 12/24/2022   Procedure: ATRIAL FIBRILLATION ABLATION;  Surgeon: Melida Quitter, MD;  Location: [facility];  Service: Cardiovascular;  Laterality: N/A;   INGUINAL HERNIA REPAIR     bilateral   KNEE SURGERY     bilateral arthroscopic   l foot surgery Left 12/2020   PACEMAKER IMPLANT N/A 12/13/2019   Procedure: PACEMAKER IMPLANT;  Surgeon: Evans Lance, MD;  Location: [facility];  Service: Cardiovascular;  Laterality: N/A;   TRANSURETHRAL RESECTION OF PROSTATE     UMBILICAL HERNIA REPAIR      Family History  Problem Relation Age of Onset   CAD Mother 77       CABG   CAD Father 58       Died age 55   Diabetes Brother    Lung cancer Maternal Grandmother    Prostate cancer Neg Hx    Colon cancer Neg Hx     Social History   Socioeconomic History   Marital status: Married    Spouse name: Not on file   Number of children: 2   Years of education: Not on file   Highest education level: Not on file  Occupational History   Occupation: Retired    Fish farm manager: [employer]  Tobacco Use   Smoking status: Former    Types: Cigarettes   Smokeless tobacco: Never   Tobacco comments:    Former smoker Quit 46 years ago. 01/21/23  Vaping Use   Vaping Use: Never used  Substance and Sexual Activity   Alcohol use: No   Drug use: No   Sexual activity: Not on file  Other Topics Concern   Not on file  Social History Narrative   Lives with wife.   Social Determinants of  Health   Financial Resource Strain: Low Risk  (01/26/2022)   Overall Financial Resource Strain (CARDIA)    Difficulty of Paying Living Expenses: Not hard at all  Food Insecurity: No Food Insecurity (01/26/2022)   Hunger Vital Sign    Worried About Running Out of Food in the Last Year: Never true    Ran Out of Food in the Last Year: Never true  Transportation Needs: No Transportation Needs (01/26/2022)   PRAPARE - Hydrologist (Medical): No    Lack of Transportation (Non-Medical): No  Physical Activity: Inactive (01/26/2022)   Exercise Vital Sign    Days of Exercise per Week: 0 days    Minutes of Exercise per Session: 0 min  Stress: No Stress Concern Present (01/26/2022)   [place]    Feeling of Stress : Not at all  Social Connections: Moderately Integrated  (01/26/2022)   Social Connection and Isolation Panel [NHANES]    Frequency of Communication with Friends and Family: Three times a week    Frequency of Social Gatherings with Friends and Family: Twice a week    Attends Religious Services: More than 4 times per year    Active Member of [employer] or Organizations: No    Attends Archivist Meetings: Never    Marital Status: Married  Human resources officer Violence: Not At Risk (01/26/2022)   Humiliation, Afraid, Rape, and Kick questionnaire    Fear of Current or Ex-Partner: No    Emotionally Abused: No    Physically Abused: No    Sexually Abused: No    Outpatient Medications Prior to Visit  Medication Sig Dispense Refill   acetaminophen (TYLENOL) 500 MG tablet Take 1,000 mg by mouth every 6 (six) hours as needed (pain.).     amLODipine (NORVASC) 5 MG tablet Take 1 tablet (5 mg total) by mouth daily. (Patient taking differently: Take 5 mg by mouth daily as needed (elevated blood pressure.).) 90 tablet 3   cholecalciferol (VITAMIN D3) 25 MCG (1000 UNIT) tablet Take 2,000 Units by mouth daily in the afternoon.     Cyanocobalamin (VITAMIN B 12) 500 MCG TABS Take 1,000 mcg by mouth daily in the afternoon.     diclofenac Sodium (VOLTAREN) 1 % GEL Apply 4 g topically in the morning and at bedtime. (Patient taking differently: Apply 4 g topically 2 (two) times daily as needed (knee pain.).) 100 g 6   dofetilide (TIKOSYN) 250 MCG capsule TAKE ONE CAPSULE BY MOUTH TWICE A DAY 180 capsule 3   ENBREL SURECLICK 50 MG/ML injection Inject 50 mg into the skin once a week. Friday or Saturday     ferrous fumarate (HEMOCYTE - 106 MG FE) 325 (106 Fe) MG TABS tablet Take 1 tablet (106 mg of iron total) by mouth 2 (two) times daily. (Patient taking differently: Take 1 tablet by mouth daily.) 180 tablet 1   fluticasone (FLONASE) 50 MCG/ACT nasal spray Place 1 spray into both nostrils as needed for allergies.     folic acid (FOLVITE) [id number] MCG tablet Take 800 mcg by  mouth daily in the afternoon.     hydrOXYzine (VISTARIL) 25 MG capsule TAKE ONE CAPSULE BY MOUTH EVERY 8 HOURS AS NEEDED 60 capsule 0   Krill Oil 350 MG CAPS Take 350 mg by mouth every Monday, Tuesday, Wednesday, Thursday, and Friday.     levothyroxine (SYNTHROID) 137 MCG tablet Take 1 tablet (137 mcg total) by mouth daily before  breakfast. 90 tablet 1   losartan (COZAAR) 50 MG tablet TAKE 1 TABLET (50 MG TOTAL) BY MOUTH IN THE MORNING AND AT BEDTIME. (Patient taking differently: Take 50 mg by mouth 2 (two) times daily as needed (elevated blood pressure.).) 180 tablet 3   meclizine (ANTIVERT) 25 MG tablet Take 25 mg by mouth 3 (three) times daily as needed for dizziness.     Melatonin 5 MG TABS Take 5 mg by mouth at bedtime.     OVER THE COUNTER MEDICATION Take 2 capsules by mouth daily in the afternoon. Nervprin - Healthy Nerve Support - Peripheral Neuropathy Relief     oxybutynin (DITROPAN-XL) 10 MG 24 hr tablet Take 10 mg by mouth at bedtime.     PRALUENT 150 MG/ML SOAJ INJECT 150 MG INTO THE SKIN EVERY 14 DAYS 2 mL 11   pregabalin (LYRICA) 50 MG capsule Take 50 mg by mouth at bedtime.     rivaroxaban (XARELTO) 20 MG TABS tablet Take 1 tablet (20 mg total) by mouth daily with supper. Dose change, please call [phone] 90 tablet 0   tadalafil (CIALIS) 20 MG tablet Take 20 mg by mouth daily as needed for erectile dysfunction.     tamsulosin (FLOMAX) 0.4 MG CAPS capsule Take 0.4 mg by mouth at bedtime.     triamcinolone cream (KENALOG) 0.1 % Apply 1 application topically daily as needed for itching.     colchicine 0.6 MG tablet Take 1 tablet (0.6 mg total) by mouth 2 (two) times daily for 5 days. 10 tablet 0   No facility-administered medications prior to visit.    Allergies  Allergen Reactions   Codeine     Tension, nasty feeling    Nsaids Other (See Comments)    Renal insufficiency   Prednisone Other (See Comments)    Unknown/ sometimes takes with lower dose   Statins Other (See  Comments)    Joint pain    Sulfa Antibiotics Other (See Comments)    Joint pain    Review of Systems  Respiratory:  Positive for cough.   Gastrointestinal:  Positive for heartburn.       Objective:    Physical Exam Constitutional:      General: He is not in acute distress.    Appearance: Normal appearance. He is not ill-appearing.  HENT:     Head: Normocephalic and atraumatic.     Right Ear: External ear normal.     Left Ear: External ear normal.  Eyes:     Extraocular Movements: Extraocular movements intact.     Pupils: Pupils are equal, round, and reactive to light.  Cardiovascular:     Rate and Rhythm: Normal rate and regular rhythm.     Heart sounds: Normal heart sounds. No murmur heard.    No gallop.  Pulmonary:     Effort: Pulmonary effort is normal. No respiratory distress.     Breath sounds: Normal breath sounds. No wheezing or rales.  Skin:    General: Skin is warm and dry.  Neurological:     Mental Status: He is alert and oriented to person, place, and time.  Psychiatric:        Judgment: Judgment normal.     BP 120/80 (BP Location: Left Arm, Patient Position: Sitting, Cuff Size: Normal)   Pulse 63   Temp 97.8 F (36.6 C) (Oral)   Resp 18   Ht 5\' 10"  (1.778 m)   Wt 213 lb 3.2 oz (96.7 kg)   SpO2 97%  BMI 30.59 kg/m  Wt Readings from Last 3 Encounters:  02/25/23 213 lb 3.2 oz (96.7 kg)  01/21/23 219 lb 3.2 oz (99.4 kg)  12/24/22 208 lb (94.3 kg)       Assessment & Plan:  Dyspepsia Assessment & Plan: Protonix daily Refer to GI Check labs   Orders: -     Pantoprazole Sodium; Take 1 tablet (40 mg total) by mouth daily.  Dispense: 30 tablet; Refill: 3 -     CBC+Platelet+Hem Review -     Comprehensive metabolic panel -     H. pylori antibody, IgG -     Ambulatory referral to Gastroenterology    I, Ann Held, DO, personally preformed the services described in this documentation.  All medical record entries made by the scribe  were at my direction and in my presence.  I have reviewed the chart and discharge instructions (if applicable) and agree that the record reflects my personal performance and is accurate and complete. 02/25/2023   I,Shehryar Baig,acting as a scribe for Ann Held, DO.,have documented all relevant documentation on the behalf of Ann Held, DO,as directed by  Ann Held, DO while in the presence of Ann Held, DO.   Ann Held, DO

## 2023-03-01 LAB — CBC+PLATELET+HEM REVIEW
BASO(ABSOLUTE): 0.1 10*3/uL (ref 0.0–0.2)
Basophils Manual: 1 %
EOS (ABSOLUTE VALUE): 0.2 10*3/uL (ref 0.0–0.4)
Eosinophils Manual: 2 %
Hematocrit: 51.7 % — ABNORMAL HIGH (ref 37.5–51.0)
Hemoglobin: 16.9 g/dL (ref 13.0–17.7)
Lymphocytes Absolute: 1.6 10*3/uL (ref 0.7–3.1)
Lymphocytes Manual: 17 %
MCH: 27.5 pg (ref 26.6–33.0)
MCHC: 32.7 g/dL (ref 31.5–35.7)
MCV: 84 fL (ref 79–97)
Monocytes Manual: 11 %
Monocytes(Absolute): 1 10*3/uL — ABNORMAL HIGH (ref 0.1–0.9)
Neutrophils Absolute: 6.3 10*3/uL (ref 1.4–7.0)
Neutrophils Manual: 69 %
PLATELET COMMENT: ADEQUATE
Platelets: 184 10*3/uL (ref 150–450)
RBC COMMENT: NORMAL
RBC: 6.14 x10E6/uL — ABNORMAL HIGH (ref 4.14–5.80)
RDW: 13.9 % (ref 11.6–15.4)
WBC: 9.2 10*3/uL (ref 3.4–10.8)

## 2023-03-09 ENCOUNTER — Other Ambulatory Visit: Payer: Self-pay | Admitting: *Deleted

## 2023-03-09 MED ORDER — AMLODIPINE BESYLATE 5 MG PO TABS: 5.0000 mg | ORAL_TABLET | Freq: Every day | ORAL | 3 refills | Status: DC

## 2023-03-11 ENCOUNTER — Ambulatory Visit (INDEPENDENT_AMBULATORY_CARE_PROVIDER_SITE_OTHER): Payer: Medicare Other

## 2023-03-11 DIAGNOSIS — I495 Sick sinus syndrome: Secondary | ICD-10-CM | POA: Diagnosis not present

## 2023-03-11 LAB — CUP PACEART REMOTE DEVICE CHECK
Battery Remaining Longevity: 122 mo
Battery Voltage: 3.01 V
Brady Statistic AP VP Percent: 6.55 %
Brady Statistic AP VS Percent: 70.4 %
Brady Statistic AS VP Percent: 0.08 %
Brady Statistic AS VS Percent: 22.97 %
Brady Statistic RA Percent Paced: 76.25 %
Brady Statistic RV Percent Paced: 7.01 %
Date Time Interrogation Session: 20240404070758
Implantable Lead Connection Status: 753985
Implantable Lead Connection Status: 753985
Implantable Lead Implant Date: 20210106
Implantable Lead Implant Date: 20210106
Implantable Lead Location: 753859
Implantable Lead Location: 753860
Implantable Lead Model: 3830
Implantable Lead Model: 5076
Implantable Pulse Generator Implant Date: 20210106
Lead Channel Impedance Value: 304 Ohm
Lead Channel Impedance Value: 361 Ohm
Lead Channel Impedance Value: 418 Ohm
Lead Channel Impedance Value: 532 Ohm
Lead Channel Pacing Threshold Amplitude: 0.75 V
Lead Channel Pacing Threshold Amplitude: 0.75 V
Lead Channel Pacing Threshold Pulse Width: 0.4 ms
Lead Channel Pacing Threshold Pulse Width: 0.4 ms
Lead Channel Sensing Intrinsic Amplitude: 1.75 mV
Lead Channel Sensing Intrinsic Amplitude: 1.75 mV
Lead Channel Sensing Intrinsic Amplitude: 25 mV
Lead Channel Sensing Intrinsic Amplitude: 25 mV
Lead Channel Setting Pacing Amplitude: 1.5 V
Lead Channel Setting Pacing Amplitude: 2.5 V
Lead Channel Setting Pacing Pulse Width: 0.4 ms
Lead Channel Setting Sensing Sensitivity: 2 mV
Zone Setting Status: 755011
Zone Setting Status: 755011

## 2023-03-12 ENCOUNTER — Telehealth: Payer: Self-pay | Admitting: Cardiovascular Disease

## 2023-03-12 ENCOUNTER — Other Ambulatory Visit (HOSPITAL_COMMUNITY): Payer: Self-pay

## 2023-03-12 ENCOUNTER — Telehealth: Payer: Self-pay

## 2023-03-12 NOTE — Telephone Encounter (Signed)
Pt c/o medication issue:  1. Name of Medication: PRALUENT 150 MG/ML SOAJ   2. How are you currently taking this medication (dosage and times per day)?   3. Are you having a reaction (difficulty breathing--STAT)?   4. What is your medication issue? Patient states that prior Berkley Harvey is needed for this medication to be filled.    [facility] [address] [place], [place] - [address] AT [facility] [address]

## 2023-03-14 ENCOUNTER — Other Ambulatory Visit (HOSPITAL_COMMUNITY): Payer: Self-pay

## 2023-03-15 ENCOUNTER — Telehealth: Payer: Self-pay | Admitting: Cardiovascular Disease

## 2023-03-15 ENCOUNTER — Other Ambulatory Visit (HOSPITAL_COMMUNITY): Payer: Self-pay

## 2023-03-15 ENCOUNTER — Telehealth: Payer: Self-pay

## 2023-03-15 MED ORDER — REPATHA SURECLICK 140 MG/ML ~~LOC~~ SOAJ: 1.0000 mL | SUBCUTANEOUS | 11 refills | Status: DC

## 2023-03-15 NOTE — Telephone Encounter (Signed)
Pt c/o medication issue:  1. Name of Medication: PRALUENT 150 MG/ML SOAJ   2. How are you currently taking this medication (dosage and times per day)?    3. Are you having a reaction (difficulty breathing--STAT)? no  4. What is your medication issue? Insurance is not covering medication anymore. Patient would like to start taking the Repatha. Please advise

## 2023-03-15 NOTE — Telephone Encounter (Signed)
Pt is calling back regarding the PA needed for Praluent. He is confused as to why this was ran through his [employer] as it has never been ran through [employer]. He states it has always went through the drug store, unless he just didn't know any different.

## 2023-03-15 NOTE — Telephone Encounter (Signed)
Pharmacy Patient Advocate Encounter  Received notification from Medicare Part D that the request for prior authorization for Praluent 150 mg/ml has been denied due to .     However the Alternative medication Repatha 140mg /ml has been Approved and the pt will need a new RX.

## 2023-03-15 NOTE — Telephone Encounter (Signed)
Called pt to let him know Repatha was approved and Rx sent. LVM for pt to call back.

## 2023-03-16 NOTE — Telephone Encounter (Signed)
Patient call to find out about [employer] preferred [id number] is Repatha - PA has been approved and prescription has been sent to patient's preferred pharmacy. Patient is aware.

## 2023-03-17 DIAGNOSIS — M79671 Pain in right foot: Secondary | ICD-10-CM | POA: Diagnosis not present

## 2023-03-17 DIAGNOSIS — E669 Obesity, unspecified: Secondary | ICD-10-CM | POA: Diagnosis not present

## 2023-03-17 DIAGNOSIS — M5136 Other intervertebral disc degeneration, lumbar region: Secondary | ICD-10-CM | POA: Diagnosis not present

## 2023-03-17 DIAGNOSIS — M25561 Pain in right knee: Secondary | ICD-10-CM | POA: Diagnosis not present

## 2023-03-17 DIAGNOSIS — L409 Psoriasis, unspecified: Secondary | ICD-10-CM | POA: Diagnosis not present

## 2023-03-17 DIAGNOSIS — M1991 Primary osteoarthritis, unspecified site: Secondary | ICD-10-CM | POA: Diagnosis not present

## 2023-03-17 DIAGNOSIS — L299 Pruritus, unspecified: Secondary | ICD-10-CM | POA: Diagnosis not present

## 2023-03-17 DIAGNOSIS — N1832 Chronic kidney disease, stage 3b: Secondary | ICD-10-CM | POA: Diagnosis not present

## 2023-03-17 DIAGNOSIS — L4050 Arthropathic psoriasis, unspecified: Secondary | ICD-10-CM | POA: Diagnosis not present

## 2023-03-17 DIAGNOSIS — Z111 Encounter for screening for respiratory tuberculosis: Secondary | ICD-10-CM | POA: Diagnosis not present

## 2023-03-17 DIAGNOSIS — M25562 Pain in left knee: Secondary | ICD-10-CM | POA: Diagnosis not present

## 2023-03-17 DIAGNOSIS — Z6830 Body mass index (BMI) 30.0-30.9, adult: Secondary | ICD-10-CM | POA: Diagnosis not present

## 2023-03-22 ENCOUNTER — Telehealth: Payer: Self-pay

## 2023-03-22 ENCOUNTER — Encounter: Payer: Self-pay | Admitting: *Deleted

## 2023-03-22 NOTE — Telephone Encounter (Signed)
Called to confirm participation in next PREP class at [facility] on April 29, assessment visit scheduled for 4/22 at 12 noon.

## 2023-03-23 ENCOUNTER — Ambulatory Visit: Payer: Medicare Other | Attending: Cardiovascular Disease | Admitting: Cardiovascular Disease

## 2023-03-23 ENCOUNTER — Encounter: Payer: Self-pay | Admitting: Cardiovascular Disease

## 2023-03-23 VITALS — BP 140/80 | HR 58 | Ht 70.0 in | Wt 213.8 lb

## 2023-03-23 DIAGNOSIS — Z950 Presence of cardiac pacemaker: Secondary | ICD-10-CM

## 2023-03-23 DIAGNOSIS — I480 Paroxysmal atrial fibrillation: Secondary | ICD-10-CM | POA: Diagnosis not present

## 2023-03-23 NOTE — Patient Instructions (Signed)
Medication Instructions:  Your physician recommends that you continue on your current medications as directed. Please refer to the Current Medication list given to you today. *If you need a refill on your cardiac medications before your next appointment, please call your pharmacy*   Follow-Up: At [facility], you and your health needs are our priority.  As part of our continuing mission to provide you with exceptional heart care, we have created designated Provider Care Teams.  These Care Teams include your primary Cardiologist (physician) and Advanced Practice Providers (APPs -  Physician Assistants and Nurse Practitioners) who all work together to provide you with the care you need, when you need it.  We recommend signing up for the patient portal called "MyChart".  Sign up information is provided on this After Visit Summary.  MyChart is used to connect with patients for Virtual Visits (Telemedicine).  Patients are able to view lab/test results, encounter notes, upcoming appointments, etc.  Non-urgent messages can be sent to your provider as well.   To learn more about what you can do with MyChart, go to [email or web address].    Your next appointment:   6 month(s)  Provider:   Augustus Mealor, MD  

## 2023-03-23 NOTE — Progress Notes (Signed)
Electrophysiology Office Note:    Date:  03/23/2023   ID:  Martin Rubio, DOB 05-13-1946, MRN [id number]  PCP:  Wanda Plump, MD   [facility] Providers Cardiologist:  Rollene Rotunda, MD Electrophysiologist:  Lewayne Bunting, MD     Referring MD: Wanda Plump, MD   Chief complaint: a fib is getting worse  History of Present Illness:    Martin Rubio is a 77 y.o. male with a hx of CHB and medtronic pacemaker placed in 2021.  He was diagnosed with AF in 2020. He has been managed with dofetilide but has been having increased burden of AF and is quite symptomatic with fatigue and palpitations. Episodes are occurring daily, and this is corroborated by AF trends on his device.  He underwent ablation for atrial fibrillation on December 24, 2022.  The procedure was uncomplicated.  Since the ablation, his device has detected just a few episodes of atrial fibrillation, most soon after the procedure.  The patient reports that he has been doing very well he has not had any symptoms of atrial fibrillation since January.  He denies chest pain, syncope, presyncope.    Past Medical History:  Diagnosis Date   Bronchitis    Diverticulosis    FH: BPH (benign prostatic hypertrophy)    History of Graves' disease 01/19/2018   S/p ablation   HTN (hypertension)    Hyperlipidemia    Obesity    Psoriatic arthritis    Rheumatoid arthritis 08/07/2014   Sleep apnea    CPAP   Thyroid disease     Past Surgical History:  Procedure Laterality Date   ABDOMINAL AORTOGRAM W/LOWER EXTREMITY N/A 01/14/2022   Procedure: ABDOMINAL AORTOGRAM W/LOWER EXTREMITY;  Surgeon: Iran Ouch, MD;  [facility]: [facility];  Service: Cardiovascular;  Laterality: N/A;   ATRIAL FIBRILLATION ABLATION N/A 12/24/2022   Procedure: ATRIAL FIBRILLATION ABLATION;  Surgeon: Maurice Small, MD;  Location: [facility];  Service: Cardiovascular;  Laterality: N/A;   INGUINAL HERNIA REPAIR     bilateral    KNEE SURGERY     bilateral arthroscopic   l foot surgery Left 12/2020   PACEMAKER IMPLANT N/A 12/13/2019   Procedure: PACEMAKER IMPLANT;  Surgeon: Marinus Maw, MD;  Location: [facility];  Service: Cardiovascular;  Laterality: N/A;   TRANSURETHRAL RESECTION OF PROSTATE     UMBILICAL HERNIA REPAIR      Current Medications: Current Meds  Medication Sig   acetaminophen (TYLENOL) 500 MG tablet Take 1,000 mg by mouth every 6 (six) hours as needed (pain.).   amLODipine (NORVASC) 5 MG tablet Take 1 tablet (5 mg total) by mouth daily.   cholecalciferol (VITAMIN D3) 25 MCG (1000 UNIT) tablet Take 2,000 Units by mouth daily in the afternoon.   Cyanocobalamin (VITAMIN B 12) 500 MCG TABS Take 1,000 mcg by mouth daily in the afternoon.   diclofenac Sodium (VOLTAREN) 1 % GEL Apply 4 g topically in the morning and at bedtime. (Patient taking differently: Apply 4 g topically 2 (two) times daily as needed (knee pain.).)   dofetilide (TIKOSYN) 250 MCG capsule TAKE ONE CAPSULE BY MOUTH TWICE A DAY   ENBREL SURECLICK 50 MG/ML injection Inject 50 mg into the skin once a week. Friday or Saturday   Evolocumab (REPATHA SURECLICK) 140 MG/ML SOAJ Inject 140 mg into the skin every 14 (fourteen) days.   ferrous fumarate (HEMOCYTE - 106 MG FE) 325 (106 Fe) MG TABS tablet Take 1  tablet (106 mg of iron total) by mouth 2 (two) times daily. (Patient taking differently: Take 1 tablet by mouth daily.)   fluticasone (FLONASE) 50 MCG/ACT nasal spray Place 1 spray into both nostrils as needed for allergies.   folic acid (FOLVITE) 800 MCG tablet Take 800 mcg by mouth daily in the afternoon.   hydrOXYzine (VISTARIL) 25 MG capsule TAKE ONE CAPSULE BY MOUTH EVERY 8 HOURS AS NEEDED   Krill Oil 350 MG CAPS Take 350 mg by mouth every Monday, Tuesday, Wednesday, Thursday, and Friday.   levothyroxine (SYNTHROID) 137 MCG tablet Take 1 tablet (137 mcg total) by mouth daily before breakfast.   losartan (COZAAR) 50 MG tablet TAKE  1 TABLET (50 MG TOTAL) BY MOUTH IN THE MORNING AND AT BEDTIME. (Patient taking differently: Take 50 mg by mouth 2 (two) times daily as needed (elevated blood pressure.).)   meclizine (ANTIVERT) 25 MG tablet Take 25 mg by mouth 3 (three) times daily as needed for dizziness.   Melatonin 5 MG TABS Take 5 mg by mouth at bedtime.   OVER THE COUNTER MEDICATION Take 2 capsules by mouth daily in the afternoon. Nervprin - Healthy Nerve Support - Peripheral Neuropathy Relief   oxybutynin (DITROPAN-XL) 10 MG 24 hr tablet Take 10 mg by mouth at bedtime.   pantoprazole (PROTONIX) 40 MG tablet Take 1 tablet (40 mg total) by mouth daily.   pregabalin (LYRICA) 50 MG capsule Take 50 mg by mouth at bedtime.   rivaroxaban (XARELTO) 20 MG TABS tablet Take 1 tablet (20 mg total) by mouth daily with supper. Dose change, please call [phone]   tadalafil (CIALIS) 20 MG tablet Take 20 mg by mouth daily as needed for erectile dysfunction.   tamsulosin (FLOMAX) 0.4 MG CAPS capsule Take 0.4 mg by mouth at bedtime.   triamcinolone cream (KENALOG) 0.1 % Apply 1 application topically daily as needed for itching.     Allergies:   Codeine, Nsaids, Prednisone, Statins, and Sulfa antibiotics   Social History   Socioeconomic History   Marital status: Married    Spouse name: Not on file   Number of children: 2   Years of education: Not on file   Highest education level: Not on file  Occupational History   Occupation: Retired    Associate Professor: [employer]  Tobacco Use   Smoking status: Former    Types: Cigarettes   Smokeless tobacco: Never   Tobacco comments:    Former smoker Quit 46 years ago. 01/21/23  Vaping Use   Vaping Use: Never used  Substance and Sexual Activity   Alcohol use: No   Drug use: No   Sexual activity: Not on file  Other Topics Concern   Not on file  Social History Narrative   Lives with wife.   Social Determinants of Health   Financial Resource Strain: Low Risk  (01/26/2022)   Overall  Financial Resource Strain (CARDIA)    Difficulty of Paying Living Expenses: Not hard at all  Food Insecurity: No Food Insecurity (01/26/2022)   Hunger Vital Sign    Worried About Running Out of Food in the Last Year: Never true    Ran Out of Food in the Last Year: Never true  Transportation Needs: No Transportation Needs (01/26/2022)   PRAPARE - Administrator, Civil Service (Medical): No    Lack of Transportation (Non-Medical): No  Physical Activity: Inactive (01/26/2022)   Exercise Vital Sign    Days of Exercise per Week: 0 days  Minutes of Exercise per Session: 0 min  Stress: No Stress Concern Present (01/26/2022)   [employer] of Occupational Health - Occupational Stress Questionnaire    Feeling of Stress : Not at all  Social Connections: Moderately Integrated (01/26/2022)   Social Connection and Isolation Panel [NHANES]    Frequency of Communication with Friends and Family: Three times a week    Frequency of Social Gatherings with Friends and Family: Twice a week    Attends Religious Services: More than 4 times per year    Active Member of [employer] or Organizations: No    Attends Engineer, structural: Never    Marital Status: Married     Family History: The patient's family history includes CAD (age of onset: 10) in his mother; CAD (age of onset: 2) in his father; Diabetes in his brother; Lung cancer in his maternal grandmother. There is no history of Prostate cancer or Colon cancer.  ROS:   Please see the history of present illness.    All other systems reviewed and are negative.  EKGs/Labs/Other Studies Reviewed Today:      EKG:  Last EKG results: today -- A-sensed, V-paced   Recent Labs: 11/17/2022: TSH 0.31 02/25/2023: ALT 18; BUN 22; Creatinine, Ser 1.62; Hemoglobin 16.9; Platelets 184; Potassium 4.3; Sodium 140     Physical Exam:    VS:  BP (!) 140/80   Pulse (!) 58   Ht 5\' 10"  (1.778 m)   Wt 213 lb 12.8 oz (97 kg)   SpO2 95%   BMI  30.68 kg/m     Wt Readings from Last 3 Encounters:  03/23/23 213 lb 12.8 oz (97 kg)  02/25/23 213 lb 3.2 oz (96.7 kg)  01/21/23 219 lb 3.2 oz (99.4 kg)     GEN: Well nourished, well developed in no acute distress CARDIAC: RRR, no murmurs, rubs, gallops RESPIRATORY:  Normal work of breathing MUSCULOSKELETAL: no edema    ASSESSMENT & PLAN:    Paroxysmal atrial fibrillation:  S/p ablation 12/24/2022 Has had very rare device-detected AF since, essentially one episode 2/24 Follow-up 1 year Continue dofetilide.  Renal function is stable.  Metabolic panel 02/20/23 reviewed.  Secondary hypercoagulable state:  continue xarelto  Medtronic dual chamber pacemaker: See interrogation in [employer] today Normal function He is not device dependent today        Medication Adjustments/Labs and Tests Ordered: Current medicines are reviewed at length with the patient today.  Concerns regarding medicines are outlined above.  No orders of the defined types were placed in this encounter.  No orders of the defined types were placed in this encounter.    Signed, Maurice Small, MD  03/23/2023 2:13 PM    [facility]

## 2023-03-25 DIAGNOSIS — L2089 Other atopic dermatitis: Secondary | ICD-10-CM | POA: Diagnosis not present

## 2023-03-25 DIAGNOSIS — I781 Nevus, non-neoplastic: Secondary | ICD-10-CM | POA: Diagnosis not present

## 2023-03-26 DIAGNOSIS — G4733 Obstructive sleep apnea (adult) (pediatric): Secondary | ICD-10-CM | POA: Diagnosis not present

## 2023-03-29 NOTE — Progress Notes (Signed)
[facility] PREP Evaluation  PatGUSTAV KNUEPPEL  Name: Martin Rubio MRN: [id number] Date of Birth: 1946/05/02 Age: 77 y.o. PCP: Wanda Plump, MD  Vitals:   03/29/23 1227  BP: 114/60  Pulse: 60  SpO2: 99%  Weight: 214 lb 6.4 oz (97.3 kg)     [facility] Eval - 03/29/23 1200       [facility] "PREP" Location   [facility] "PREP" Location [facility]      Referral    Referring Provider Fenton    Reason for referral Hypertension;Inactivity;Obesitity/Overweight;Heart Failure    Program Start Date 04/05/23      Measurement   Waist Circumference 47 inches    Hip Circumference 41.5 inches    Body fat 33.5 percent      Information for Trainer   Goals --   Increase stamina, improve/lessen neuropathy   Current Exercise --   works out at [employer] Concerns --   OA knees   Pertinent Medical History --   HTN, pacemaker, AFIB, s/p ablation 1/24, OSA, neuropathy     Timed Up and Go (TUGS)   Timed Up and Go Moderate risk 10-12 seconds      Mobility and Daily Activities   I find it easy to walk up or down two or more flights of stairs. 3    I have no trouble taking out the trash. 4    I do housework such as vacuuming and dusting on my own without difficulty. 3    I can easily lift a gallon of milk (8lbs). 4    I can easily walk a mile. 1    I have no trouble reaching into high cupboards or reaching down to pick up something from the floor. 1    I do not have trouble doing out-door work such as Loss adjuster, chartered, raking leaves, or gardening. 1      Mobility and Daily Activities   I feel younger than my age. 2    I feel independent. 3    I feel energetic. 2    I live an active life.  2    I feel strong. 2    I feel healthy. 2    I feel active as other people my age. 3      How fit and strong are you.   Fit and Strong Total Score 33            Past Medical History:  Diagnosis Date   Bronchitis    Diverticulosis    FH: BPH (benign prostatic hypertrophy)    History of  Graves' disease 01/19/2018   S/p ablation   HTN (hypertension)    Hyperlipidemia    Obesity    Psoriatic arthritis    Rheumatoid arthritis 08/07/2014   Sleep apnea    CPAP   Thyroid disease    Past Surgical History:  Procedure Laterality Date   ABDOMINAL AORTOGRAM W/LOWER EXTREMITY N/A 01/14/2022   Procedure: ABDOMINAL AORTOGRAM W/LOWER EXTREMITY;  Surgeon: Iran Ouch, MD;  [facility];  Service: Cardiovascular;  Laterality: N/A;   ATRIAL FIBRILLATION ABLATION N/A 12/24/2022   Procedure: ATRIAL FIBRILLATION ABLATION;  Surgeon: Maurice Small, MD;  [facility];  Service: Cardiovascular;  Laterality: N/A;   INGUINAL HERNIA REPAIR     bilateral   KNEE SURGERY     bilateral arthroscopic   l foot surgery Left 12/2020   PACEMAKER IMPLANT N/A 12/13/2019   Procedure:  PACEMAKER IMPLANT;  Surgeon: Marinus Maw, MD;  [facility]: [facility] INVASIVE CV LAB;  Service: Cardiovascular;  Laterality: N/A;   TRANSURETHRAL RESECTION OF PROSTATE     UMBILICAL HERNIA REPAIR     Social History   Tobacco Use  Smoking Status Former   Types: Cigarettes  Smokeless Tobacco Never  Tobacco Comments   Former smoker Quit 46 years ago. 01/21/23  To begin PREP at [facility], starting April 29, every M/W 3:30-4:45  Sonia Baller 03/29/2023, 12:30 PM

## 2023-04-05 NOTE — Progress Notes (Signed)
[facility] PRE[facility] Weekly Session  Patient Details  Name: Martin Rubio MRN: [id number] Date of Birth: Apr 27, 1946 Age: 77 y.o. PCP: Wanda Plump, MD  There were no vitals filed for this visit.   [facility] Weekly seesion - 04/05/23 1600       [facility] "PREP" Location   [facility] "PREP" Location [facility]      Weekly Session   Topic Discussed Goal setting and welcome to the program   Introductions, review of notebook, tour of facility   Classes attended to date 1             Sonia Baller 04/05/2023, 4:41 PM

## 2023-04-06 ENCOUNTER — Ambulatory Visit (INDEPENDENT_AMBULATORY_CARE_PROVIDER_SITE_OTHER): Payer: Medicare Other | Admitting: Podiatry

## 2023-04-06 DIAGNOSIS — M79674 Pain in right toe(s): Secondary | ICD-10-CM | POA: Diagnosis not present

## 2023-04-06 DIAGNOSIS — M79675 Pain in left toe(s): Secondary | ICD-10-CM | POA: Diagnosis not present

## 2023-04-06 DIAGNOSIS — B351 Tinea unguium: Secondary | ICD-10-CM

## 2023-04-06 NOTE — Progress Notes (Signed)
   Chief Complaint  Patient presents with   Nail Problem    Patient came in today for left hallux pain, nail fungus, started 2 months ago, no drainage, rate of pain 1 out of 10,  right hallux has some pain also    SUBJECTIVE Patient presents to office today complaining of elongated, thickened nails that cause pain while ambulating in shoes.  Patient is unable to trim their own nails. Patient is here for further evaluation and treatment.  Past Medical History:  Diagnosis Date   Bronchitis    Diverticulosis    FH: BPH (benign prostatic hypertrophy)    History of Graves' disease 01/19/2018   S/p ablation   HTN (hypertension)    Hyperlipidemia    Obesity    Psoriatic arthritis (HCC)    Rheumatoid arthritis (HCC) 08/07/2014   Sleep apnea    CPAP   Thyroid disease     Allergies  Allergen Reactions   Codeine     Tension, nasty feeling    Nsaids Other (See Comments)    Renal insufficiency   Prednisone Other (See Comments)    Unknown/ sometimes takes with lower dose   Statins Other (See Comments)    Joint pain    Sulfa Antibiotics Other (See Comments)    Joint pain     OBJECTIVE General Patient is awake, alert, and oriented x 3 and in no acute distress. Derm Skin is dry and supple bilateral. Negative open lesions or macerations. Remaining integument unremarkable. Nails are tender, long, thickened and dystrophic with subungual debris, consistent with onychomycosis, 1-5 bilateral. No signs of infection noted. Vasc  DP and PT pedal pulses palpable bilaterally. Temperature gradient within normal limits.  Neuro Epicritic and protective threshold sensation grossly intact bilaterally.  Musculoskeletal Exam No symptomatic pedal deformities noted bilateral. Muscular strength within normal limits.  ASSESSMENT 1.  Pain due to onychomycosis of toenails both  PLAN OF CARE 1. Patient evaluated today.  2. Instructed to maintain good pedal hygiene and foot care.  3. Mechanical  debridement of nails 1-5 bilaterally performed using a nail nipper. Filed with dremel without incident.  4. Return to clinic in 3 mos.    Felecia Shelling, DPM [facility]  Dr. Felecia Shelling, DPM    [address] [place], [place] [address]                Office [phone]  Fax [phone]

## 2023-04-08 NOTE — Progress Notes (Signed)
Office Note    HPI: Martin Rubio is a 77 y.o. (May 19, 1946) male presenting in follow-up with known, small AAA, bilateral iliac artery stenosis, left common iliac artery aneurysm.  On exam, Martin Rubio was doing well.  A [place] native, he moved down to [place] years ago.  He is an Art gallery manager by trade.   Since last seen on year ago, Martin Rubio has had no major health changes.  He denies symptoms of claudication, ischemic rest pain, tissue loss.  Denies abdominal pain, chest pain.  Has baseline back pain, which is no worse.  He has had a low activity level, but is working to start an exercise regimen. He noted that he is obese, and needs to lose weight.  Martin Rubio Kitchen  He is very involved in [place], and has a strong faith.  He is part of a prayer group at the state level, which has [place] send it to her support. He cannot take statins due to myalgia, and has a history of GI ulcer and does not take ASA.  The pt is not on a statin for cholesterol management.  The pt is not on a daily aspirin.   Other AC:  Xarelto The pt is  on medication for hypertension.   The pt is not diabetic.  Tobacco hx:  -  Past Medical History:  Diagnosis Date   Bronchitis    Diverticulosis    FH: BPH (benign prostatic hypertrophy)    History of Graves' disease 01/19/2018   S/p ablation   HTN (hypertension)    Hyperlipidemia    Obesity    Psoriatic arthritis (HCC)    Rheumatoid arthritis (HCC) 08/07/2014   Sleep apnea    CPAP   Thyroid disease     Past Surgical History:  Procedure Laterality Date   ABDOMINAL AORTOGRAM W/LOWER EXTREMITY N/A 01/14/2022   Procedure: ABDOMINAL AORTOGRAM W/LOWER EXTREMITY;  Surgeon: Iran Ouch, MD;  [facility];  Service: Cardiovascular;  Laterality: N/A;   ATRIAL FIBRILLATION ABLATION N/A 12/24/2022   Procedure: ATRIAL FIBRILLATION ABLATION;  Surgeon: Maurice Small, MD;  [facility];  Service: Cardiovascular;  Laterality: N/A;   INGUINAL  HERNIA REPAIR     bilateral   KNEE SURGERY     bilateral arthroscopic   l foot surgery Left 12/2020   PACEMAKER IMPLANT N/A 12/13/2019   Procedure: PACEMAKER IMPLANT;  Surgeon: Marinus Maw, MD;  [facility];  Service: Cardiovascular;  Laterality: N/A;   TRANSURETHRAL RESECTION OF PROSTATE     UMBILICAL HERNIA REPAIR      Social History   Socioeconomic History   Marital status: Married    Spouse name: Not on file   Number of children: 2   Years of education: Not on file   Highest education level: Not on file  Occupational History   Occupation: Retired    Associate Professor: [employer]  Tobacco Use   Smoking status: Former    Types: Cigarettes   Smokeless tobacco: Never   Tobacco comments:    Former smoker Quit 46 years ago. 01/21/23  Vaping Use   Vaping Use: Never used  Substance and Sexual Activity   Alcohol use: No   Drug use: No   Sexual activity: Not on file  Other Topics Concern   Not on file  Social History Narrative   Lives with wife.   Social Determinants of Health   Financial Resource Strain: Low Risk  (01/26/2022)   Overall  Financial Resource Strain (CARDIA)    Difficulty of Paying Living Expenses: Not hard at all  Food Insecurity: No Food Insecurity (01/26/2022)   Hunger Vital Sign    Worried About Running Out of Food in the Last Year: Never true    Ran Out of Food in the Last Year: Never true  Transportation Needs: No Transportation Needs (01/26/2022)   PRAPARE - Administrator, Civil Service (Medical): No    Lack of Transportation (Non-Medical): No  Physical Activity: Inactive (01/26/2022)   Exercise Vital Sign    Days of Exercise per Week: 0 days    Minutes of Exercise per Session: 0 min  Stress: No Stress Concern Present (01/26/2022)   [employer] of Occupational Health - Occupational Stress Questionnaire    Feeling of Stress : Not at all  Social Connections: Moderately Integrated (01/26/2022)   Social Connection and  Isolation Panel [NHANES]    Frequency of Communication with Friends and Family: Three times a week    Frequency of Social Gatherings with Friends and Family: Twice a week    Attends Religious Services: More than 4 times per year    Active Member of [employer] or Organizations: No    Attends Banker Meetings: Never    Marital Status: Married  Catering manager Violence: Not At Risk (01/26/2022)   Humiliation, Afraid, Rape, and Kick questionnaire    Fear of Current or Ex-Partner: No    Emotionally Abused: No    Physically Abused: No    Sexually Abused: No   Family History  Problem Relation Age of Onset   CAD Mother 53       CABG   CAD Father 31       Died age 14   Diabetes Brother    Lung cancer Maternal Grandmother    Prostate cancer Neg Hx    Colon cancer Neg Hx     Current Outpatient Medications  Medication Sig Dispense Refill   acetaminophen (TYLENOL) 500 MG tablet Take 1,000 mg by mouth every 6 (six) hours as needed (pain.).     amLODipine (NORVASC) 5 MG tablet Take 1 tablet (5 mg total) by mouth daily. 90 tablet 3   cholecalciferol (VITAMIN D3) 25 MCG (1000 UNIT) tablet Take 2,000 Units by mouth daily in the afternoon.     colchicine 0.6 MG tablet Take 1 tablet (0.6 mg total) by mouth 2 (two) times daily for 5 days. 10 tablet 0   Cyanocobalamin (VITAMIN B 12) 500 MCG TABS Take 1,000 mcg by mouth daily in the afternoon.     diclofenac Sodium (VOLTAREN) 1 % GEL Apply 4 g topically in the morning and at bedtime. (Patient taking differently: Apply 4 g topically 2 (two) times daily as needed (knee pain.).) 100 g 6   dofetilide (TIKOSYN) 250 MCG capsule TAKE ONE CAPSULE BY MOUTH TWICE A DAY 180 capsule 3   ENBREL SURECLICK 50 MG/ML injection Inject 50 mg into the skin once a week. Friday or Saturday     Evolocumab (REPATHA SURECLICK) 140 MG/ML SOAJ Inject 140 mg into the skin every 14 (fourteen) days. 2 mL 11   ferrous fumarate (HEMOCYTE - 106 MG FE) 325 (106 Fe) MG TABS  tablet Take 1 tablet (106 mg of iron total) by mouth 2 (two) times daily. (Patient taking differently: Take 1 tablet by mouth daily.) 180 tablet 1   fluticasone (FLONASE) 50 MCG/ACT nasal spray Place 1 spray into both nostrils as needed for allergies.  folic acid (FOLVITE) 800 MCG tablet Take 800 mcg by mouth daily in the afternoon.     hydrOXYzine (VISTARIL) 25 MG capsule TAKE ONE CAPSULE BY MOUTH EVERY 8 HOURS AS NEEDED 60 capsule 0   Krill Oil 350 MG CAPS Take 350 mg by mouth every Monday, Tuesday, Wednesday, Thursday, and Friday.     levothyroxine (SYNTHROID) 137 MCG tablet Take 1 tablet (137 mcg total) by mouth daily before breakfast. 90 tablet 1   losartan (COZAAR) 50 MG tablet TAKE 1 TABLET (50 MG TOTAL) BY MOUTH IN THE MORNING AND AT BEDTIME. (Patient taking differently: Take 50 mg by mouth 2 (two) times daily as needed (elevated blood pressure.).) 180 tablet 3   meclizine (ANTIVERT) 25 MG tablet Take 25 mg by mouth 3 (three) times daily as needed for dizziness.     Melatonin 5 MG TABS Take 5 mg by mouth at bedtime.     OVER THE COUNTER MEDICATION Take 2 capsules by mouth daily in the afternoon. Nervprin - Healthy Nerve Support - Peripheral Neuropathy Relief     oxybutynin (DITROPAN-XL) 10 MG 24 hr tablet Take 10 mg by mouth at bedtime.     pantoprazole (PROTONIX) 40 MG tablet Take 1 tablet (40 mg total) by mouth daily. 30 tablet 3   pregabalin (LYRICA) 50 MG capsule Take 50 mg by mouth at bedtime.     rivaroxaban (XARELTO) 20 MG TABS tablet Take 1 tablet (20 mg total) by mouth daily with supper. Dose change, please call [phone] 90 tablet 0   tadalafil (CIALIS) 20 MG tablet Take 20 mg by mouth daily as needed for erectile dysfunction.     tamsulosin (FLOMAX) 0.4 MG CAPS capsule Take 0.4 mg by mouth at bedtime.     triamcinolone cream (KENALOG) 0.1 % Apply 1 application topically daily as needed for itching.     No current facility-administered medications for this visit.     Allergies  Allergen Reactions   Codeine     Tension, nasty feeling    Nsaids Other (See Comments)    Renal insufficiency   Prednisone Other (See Comments)    Unknown/ sometimes takes with lower dose   Statins Other (See Comments)    Joint pain    Sulfa Antibiotics Other (See Comments)    Joint pain     REVIEW OF SYSTEMS:   [X]  denotes positive finding, [ ]  denotes negative finding Cardiac  Comments:  Chest pain or chest pressure:    Shortness of breath upon exertion:    Short of breath when lying flat:    Irregular heart rhythm:        Vascular    Pain in calf, thigh, or hip brought on by ambulation:    Pain in feet at night that wakes you up from your sleep:     Blood clot in your veins:    Leg swelling:         Pulmonary    Oxygen at home:    Productive cough:     Wheezing:         Neurologic    Sudden weakness in arms or legs:     Sudden numbness in arms or legs:     Sudden onset of difficulty speaking or slurred speech:    Temporary loss of vision in one eye:     Problems with dizziness:         Gastrointestinal    Blood in stool:     Vomited blood:  Genitourinary    Burning when urinating:     Blood in urine:        Psychiatric    Major depression:         Hematologic    Bleeding problems:    Problems with blood clotting too easily:        Skin    Rashes or ulcers:        Constitutional    Fever or chills:      PHYSICAL EXAMINATION:  There were no vitals filed for this visit.  General:  WDWN in NAD; vital signs documented above Gait: Not observed HENT: WNL, normocephalic Pulmonary: normal non-labored breathing , without wheezing Cardiac: regular HR,  Abdomen: soft, NT, no masses Skin: without rashes Vascular Exam/Pulses:  Right Left  Radial 2+ (normal) 2+ (normal)  Ulnar 2+ (normal) 2+ (normal)  Femoral 2+ (normal) 1+ (weak)  Popliteal    DP 2+ (normal) 2+ (normal)  PT     Extremities: without ischemic changes,  without Gangrene , without cellulitis; without open wounds;  Musculoskeletal: no muscle wasting or atrophy  Neurologic: A&O X 3;  No focal weakness or paresthesias are detected Psychiatric:  The pt has Normal affect.   Non-Invasive Vascular Imaging:      Abdominal Aorta Findings:  +------------+-------+---------+---------+---------+--------+--------------  ----+  Location   AP (cm)Trans    PSV      Waveform ThrombusComments                                (cm)     (cm/s)                                         +------------+-------+---------+---------+---------+--------+--------------  ----+  Proximal                   57       biphasic                              +------------+-------+---------+---------+---------+--------+--------------  ----+  Distal                     76       biphasic         3.0 cm.              +------------+-------+---------+---------+---------+--------+--------------  ----+  RT CIA Prox                 323      biphasic         1.88 x 1.91          +------------+-------+---------+---------+---------+--------+--------------  ----+  RT CIA Mid                  147      biphasic                              +------------+-------+---------+---------+---------+--------+--------------  ----+  RT CIA                      130      biphasic  Distal                                                                     +------------+-------+---------+---------+---------+--------+--------------  ----+  RT EIA Prox                 96       biphasic                              +------------+-------+---------+---------+---------+--------+--------------  ----+  RT EIA Mid                  71       biphasic                              +------------+-------+---------+---------+---------+--------+--------------  ----+  RT EIA                      78        triphasic                             Distal                                                                     +------------+-------+---------+---------+---------+--------+--------------  ----+  LT CIA Prox                 479      biphasic         2.72 x 2.51                                                                ANEURYSM             +------------+-------+---------+---------+---------+--------+--------------  ----+  LT CIA Mid                  117      biphasic         brisk 1.656 x  1.47  +------------+-------+---------+---------+---------+--------+--------------  ----+  LT CIA                      70       biphasic                              Distal                                                                     +------------+-------+---------+---------+---------+--------+--------------  ----+  LT EIA Prox                 73       triphasic                             +------------+-------+---------+---------+---------+--------+--------------  ----+  LT EIA Mid                  76       biphasic                              +------------+-------+---------+---------+---------+--------+--------------  ----+  LT EIA                      89       triphasic                             Distal                                                                     +------------+-------+---------+---------+---------+--------+--------------  ----+         Summary:  Abdominal Aorta: There is evidence of abnormal dilatation of the distal  Abdominal aorta measuring 3.0 cm.  Stenosis: +------------------+-------------+  Location          Stenosis       +------------------+-------------+  Right Common Iliac>50% stenosis  +------------------+-------------+  Left Common Iliac >50% stenosis  +------------------+-------------+      Note: Unable to thoroughly evaluate  aneurysms of the bilateral ilicac  arteries due to patients inability to tolerate exam.      ABI Findings:  +---------+------------------+-----+---------+--------+  Right   Rt Pressure (mmHg)IndexWaveform Comment   +---------+------------------+-----+---------+--------+  Brachial 151                                       +---------+------------------+-----+---------+--------+  PTA     161               1.07 triphasic          +---------+------------------+-----+---------+--------+  DP      124               0.82 triphasic          +---------+------------------+-----+---------+--------+  Kerby Nora               0.77 Normal             +---------+------------------+-----+---------+--------+   +---------+------------------+-----+---------+-------+  Left    Lt Pressure (mmHg)IndexWaveform Comment  +---------+------------------+-----+---------+-------+  Brachial 144                                      +---------+------------------+-----+---------+-------+  PTA     150               0.99 triphasic         +---------+------------------+-----+---------+-------+  DP      117  0.77 triphasic         +---------+------------------+-----+---------+-------+  Great Toe109               0.72 Normal            +---------+------------------+-----+---------+-------+   +-------+-----------+-----------+------------+------------+  ABI/TBIToday's ABIToday's TBIPrevious ABIPrevious TBI  +-------+-----------+-----------+------------+------------+  Right 1.07       0.77       1.07        0.71          +-------+-----------+-----------+------------+------------+  Left  0.99       0.72       0.88        0.70          +-------+-----------+-----------+------------+------------+   ASSESSMENT/PLAN: MAXIME TERLIZZI is a 77 y.o. male presenting with mixed aneurysmal occlusive disease.  Claudication symptoms have  improved, however they have is not ambulating as much as he used to. He has a small 3.1 cm infrarenal abdominal aneurysm.  Significant stenosis of the left common iliac artery with poststenotic aneurysm measuring 2.7 cm.  There is ectasia at the right external iliac artery as well measuring 1.9 cm.  Sizes of the infrarenal abdominal aorta and common iliac arteries were difficult to obtain due to the patient's body habitus.  In evaluating these numbers versus the CT scan taken 1 year ago, there is interval growth in both.  There is new ectasia of the right common iliac artery.  The left common iliac artery is similar in size and long axis.  No significant growth in the AAA.  Regardless, interval growth has not exceeded 1 cm, therefore there is no indication for repair at this time.  We discussed that his infrarenal abdominal aneurysm would be repaired electively at a size of 5.5 cm and the common iliac artery aneurysm repaired electively at a size of 3.5 cm, or if growth exceeds 0.5 cm in 6 months. With his aneurysmal occlusive disease, I am unsure if he will be a candidate for endovascular intervention.  My plan is to see him on a 2-month basis with repeat imaging. We discussed the signs and symptoms of rupture, and I asked him to call 911 immediately should any of these occur.    Victorino Sparrow, MD [facility] [phone]

## 2023-04-09 ENCOUNTER — Ambulatory Visit (HOSPITAL_COMMUNITY)
Admission: RE | Admit: 2023-04-09 | Discharge: 2023-04-09 | Disposition: A | Discharge status: Home / Self Care | Payer: Medicare Other | Source: Ambulatory Visit | Attending: Vascular Surgery | Admitting: Vascular Surgery

## 2023-04-09 ENCOUNTER — Ambulatory Visit (INDEPENDENT_AMBULATORY_CARE_PROVIDER_SITE_OTHER): Payer: Medicare Other | Admitting: Vascular Surgery

## 2023-04-09 ENCOUNTER — Encounter: Payer: Self-pay | Admitting: Vascular Surgery

## 2023-04-09 ENCOUNTER — Ambulatory Visit (INDEPENDENT_AMBULATORY_CARE_PROVIDER_SITE_OTHER)
Admission: RE | Admit: 2023-04-09 | Discharge: 2023-04-09 | Disposition: A | Discharge status: Home / Self Care | Payer: Medicare Other | Source: Ambulatory Visit | Attending: Vascular Surgery | Admitting: Vascular Surgery

## 2023-04-09 VITALS — BP 129/83 | HR 60 | Temp 97.9°F | Resp 20 | Ht 70.0 in | Wt 209.6 lb

## 2023-04-09 DIAGNOSIS — I70219 Atherosclerosis of native arteries of extremities with intermittent claudication, unspecified extremity: Secondary | ICD-10-CM | POA: Diagnosis not present

## 2023-04-09 DIAGNOSIS — I70212 Atherosclerosis of native arteries of extremities with intermittent claudication, left leg: Secondary | ICD-10-CM

## 2023-04-09 DIAGNOSIS — I723 Aneurysm of iliac artery: Secondary | ICD-10-CM | POA: Diagnosis not present

## 2023-04-09 DIAGNOSIS — I7143 Infrarenal abdominal aortic aneurysm, without rupture: Secondary | ICD-10-CM | POA: Diagnosis not present

## 2023-04-09 LAB — VAS US ABI WITH/WO TBI
Left ABI: 0.99
Right ABI: 1.07

## 2023-04-12 NOTE — Progress Notes (Signed)
[facility] PREP Weekly Session  Patient Details  Name: Martin Rubio MRN: [id number] Date of Birth: [birth year] Age: 77 y.o. PCP: Wanda Plump, MD  Vitals:   04/12/23 1645  Weight: 211 lb 8 oz (95.9 kg)     [facility] Weekly seesion - 04/12/23 1600       [facility] "PREP" Location   [facility] "PREP" Location [facility]      Weekly Session   Topic Discussed Importance of resistance training;Other ways to be active   cardio: work up to 150 minutes/wk; strength training 2-3 times a wk for 20-40 minutes   Minutes exercised this week 20 minutes    Classes attended to date 3             [name] 04/12/2023, 4:46 PM

## 2023-04-14 NOTE — Progress Notes (Signed)
Remote pacemaker transmission.   

## 2023-04-19 ENCOUNTER — Other Ambulatory Visit: Payer: Self-pay

## 2023-04-19 DIAGNOSIS — I723 Aneurysm of iliac artery: Secondary | ICD-10-CM

## 2023-04-19 DIAGNOSIS — I70219 Atherosclerosis of native arteries of extremities with intermittent claudication, unspecified extremity: Secondary | ICD-10-CM

## 2023-04-19 DIAGNOSIS — I700 Atherosclerosis of aorta: Secondary | ICD-10-CM

## 2023-04-19 NOTE — Progress Notes (Signed)
[facility] PREP Weekly Session  Patient Details  Name: Martin Rubio MRN: [id number] Date of Birth: Apr 04, 1946 Age: 77 y.o. PCP: Wanda Plump, MD  Vitals:   04/19/23 1653  Weight: 214 lb 12.8 oz (97.4 kg)     [facility] Weekly seesion - 04/19/23 1600       [facility] "PREP" Location   [facility]PREP[facility] Location [facility]      Weekly Session   Topic Discussed Healthy eating tips   introduced to [name] app; foods to reduce, foods to increase; eat the rainbow of colors   Minutes exercised this week 40 minutes    Classes attended to date 5             [name] 04/19/2023, 4:54 PM

## 2023-04-20 ENCOUNTER — Telehealth (INDEPENDENT_AMBULATORY_CARE_PROVIDER_SITE_OTHER): Payer: Medicare Other | Admitting: Family

## 2023-04-20 DIAGNOSIS — S39012A Strain of muscle, fascia and tendon of lower back, initial encounter: Secondary | ICD-10-CM | POA: Diagnosis not present

## 2023-04-20 MED ORDER — METHOCARBAMOL 500 MG PO TABS: 500.0000 mg | ORAL_TABLET | Freq: Three times a day (TID) | ORAL | 0 refills | Status: DC | PRN

## 2023-04-20 NOTE — Progress Notes (Signed)
MyChart Video Visit    Virtual Visit via Video Note    Patient location: Home. Patient and provider in visit Provider location: Office  I discussed the limitations of evaluation and management by telemedicine and the availability of in person appointments. The patient expressed understanding and agreed to proceed.  Visit Date: 04/20/2023  Today's healthcare provider: Lemont Fillers, NP     Subjective:    Patient ID: Martin Rubio, male    DOB: 05/03/1946, 77 y.o.   MRN: [id number]  Chief Complaint  Patient presents with   Back Pain    Complains of back pain that started a few days ago    HPI Patient is in today for a telehealth video visit.   Back pain:  He complains of middle right back pain and tightness. He recently got a new mattress and reports it is too soft. He states the pain began after doing a twisting motion. He has been sitting in straight chairs and has been sleeping on a different mattress. He has been taking Meloxicam once in the morning and once at night, which has been helping. States his pain is not radiating.   He has been participating in a [employer] and exercise program doing cardio and strength exercises.   Past Medical History:  Diagnosis Date   Bronchitis    Diverticulosis    FH: BPH (benign prostatic hypertrophy)    History of Graves' disease 01/19/2018   S/p ablation   HTN (hypertension)    Hyperlipidemia    Obesity    Psoriatic arthritis (HCC)    Rheumatoid arthritis (HCC) 08/07/2014   Sleep apnea    CPAP   Thyroid disease     Past Surgical History:  Procedure Laterality Date   ABDOMINAL AORTOGRAM W/LOWER EXTREMITY N/A 01/14/2022   Procedure: ABDOMINAL AORTOGRAM W/LOWER EXTREMITY;  Surgeon: Iran Ouch, MD;  Location: [facility];  Service: Cardiovascular;  Laterality: N/A;   ATRIAL FIBRILLATION ABLATION N/A 12/24/2022   Procedure: ATRIAL FIBRILLATION ABLATION;  Surgeon: Maurice Small, MD;  Location: [facility];  Service: Cardiovascular;  Laterality: N/A;   INGUINAL HERNIA REPAIR     bilateral   KNEE SURGERY     bilateral arthroscopic   l foot surgery Left 12/2020   PACEMAKER IMPLANT N/A 12/13/2019   Procedure: PACEMAKER IMPLANT;  Surgeon: Marinus Maw, MD;  Location: [facility];  Service: Cardiovascular;  Laterality: N/A;   TRANSURETHRAL RESECTION OF PROSTATE     UMBILICAL HERNIA REPAIR      Family History  Problem Relation Age of Onset   CAD Mother 31       CABG   CAD Father 77       Died age 80   Diabetes Brother    Lung cancer Maternal Grandmother    Prostate cancer Neg Hx    Colon cancer Neg Hx     Social History   Socioeconomic History   Marital status: Married    Spouse name: Not on file   Number of children: 2   Years of education: Not on file   Highest education level: Not on file  Occupational History   Occupation: Retired    Associate Professor: [employer]  Tobacco Use   Smoking status: Former    Types: Cigarettes   Smokeless tobacco: Never   Tobacco comments:    Former smoker Quit 46 years ago. 01/21/23  Vaping Use   Vaping Use: Never used  Substance and Sexual Activity   Alcohol use: No   Drug use: No   Sexual activity: Not on file  Other Topics Concern   Not on file  Social History Narrative   Lives with wife.   Social Determinants of Health   Financial Resource Strain: Low Risk  (01/26/2022)   Overall Financial Resource Strain (CARDIA)    Difficulty of Paying Living Expenses: Not hard at all  Food Insecurity: No Food Insecurity (01/26/2022)   Hunger Vital Sign    Worried About Running Out of Food in the Last Year: Never true    Ran Out of Food in the Last Year: Never true  Transportation Needs: No Transportation Needs (01/26/2022)   PRAPARE - Administrator, Civil Service (Medical): No    Lack of Transportation (Non-Medical): No  Physical Activity: Inactive (01/26/2022)   Exercise Vital Sign    Days of Exercise per  Week: 0 days    Minutes of Exercise per Session: 0 min  Stress: No Stress Concern Present (01/26/2022)   [employer] of Occupational Health - Occupational Stress Questionnaire    Feeling of Stress : Not at all  Social Connections: Moderately Integrated (01/26/2022)   Social Connection and Isolation Panel [NHANES]    Frequency of Communication with Friends and Family: Three times a week    Frequency of Social Gatherings with Friends and Family: Twice a week    Attends Religious Services: More than 4 times per year    Active Member of [employer] or Organizations: No    Attends Banker Meetings: Never    Marital Status: Married  Catering manager Violence: Not At Risk (01/26/2022)   Humiliation, Afraid, Rape, and Kick questionnaire    Fear of Current or Ex-Partner: No    Emotionally Abused: No    Physically Abused: No    Sexually Abused: No    Outpatient Medications Prior to Visit  Medication Sig Dispense Refill   acetaminophen (TYLENOL) 500 MG tablet Take 1,000 mg by mouth every 6 (six) hours as needed (pain.).     amLODipine (NORVASC) 5 MG tablet Take 1 tablet (5 mg total) by mouth daily. 90 tablet 3   cholecalciferol (VITAMIN D3) 25 MCG (1000 UNIT) tablet Take 2,000 Units by mouth daily in the afternoon.     colchicine 0.6 MG tablet Take 1 tablet (0.6 mg total) by mouth 2 (two) times daily for 5 days. 10 tablet 0   Cyanocobalamin (VITAMIN B 12) 500 MCG TABS Take 1,000 mcg by mouth daily in the afternoon.     diclofenac Sodium (VOLTAREN) 1 % GEL Apply 4 g topically in the morning and at bedtime. (Patient taking differently: Apply 4 g topically 2 (two) times daily as needed (knee pain.).) 100 g 6   dofetilide (TIKOSYN) 250 MCG capsule TAKE ONE CAPSULE BY MOUTH TWICE A DAY 180 capsule 3   ENBREL SURECLICK 50 MG/ML injection Inject 50 mg into the skin once a week. Friday or Saturday     Evolocumab (REPATHA SURECLICK) 140 MG/ML SOAJ Inject 140 mg into the skin every 14 (fourteen)  days. 2 mL 11   ferrous fumarate (HEMOCYTE - 106 MG FE) 325 (106 Fe) MG TABS tablet Take 1 tablet (106 mg of iron total) by mouth 2 (two) times daily. (Patient taking differently: Take 1 tablet by mouth daily.) 180 tablet 1   fluticasone (FLONASE) 50 MCG/ACT nasal spray Place 1 spray into both nostrils as needed for allergies.  folic acid (FOLVITE) 800 MCG tablet Take 800 mcg by mouth daily in the afternoon.     hydrOXYzine (VISTARIL) 25 MG capsule TAKE ONE CAPSULE BY MOUTH EVERY 8 HOURS AS NEEDED 60 capsule 0   Krill Oil 350 MG CAPS Take 350 mg by mouth every Monday, Tuesday, Wednesday, Thursday, and Friday.     levothyroxine (SYNTHROID) 137 MCG tablet Take 1 tablet (137 mcg total) by mouth daily before breakfast. 90 tablet 1   losartan (COZAAR) 50 MG tablet TAKE 1 TABLET (50 MG TOTAL) BY MOUTH IN THE MORNING AND AT BEDTIME. (Patient taking differently: Take 50 mg by mouth 2 (two) times daily as needed (elevated blood pressure.).) 180 tablet 3   meclizine (ANTIVERT) 25 MG tablet Take 25 mg by mouth 3 (three) times daily as needed for dizziness.     Melatonin 5 MG TABS Take 5 mg by mouth at bedtime.     OVER THE COUNTER MEDICATION Take 2 capsules by mouth daily in the afternoon. Nervprin - Healthy Nerve Support - Peripheral Neuropathy Relief     oxybutynin (DITROPAN-XL) 10 MG 24 hr tablet Take 10 mg by mouth at bedtime.     pantoprazole (PROTONIX) 40 MG tablet Take 1 tablet (40 mg total) by mouth daily. 30 tablet 3   pregabalin (LYRICA) 50 MG capsule Take 50 mg by mouth at bedtime.     rivaroxaban (XARELTO) 20 MG TABS tablet Take 1 tablet (20 mg total) by mouth daily with supper. Dose change, please call [phone] 90 tablet 0   tadalafil (CIALIS) 20 MG tablet Take 20 mg by mouth daily as needed for erectile dysfunction.     tamsulosin (FLOMAX) 0.4 MG CAPS capsule Take 0.4 mg by mouth at bedtime.     triamcinolone cream (KENALOG) 0.1 % Apply 1 application topically daily as needed for itching.      No facility-administered medications prior to visit.    Allergies  Allergen Reactions   Codeine     Tension, nasty feeling    Nsaids Other (See Comments)    Renal insufficiency   Prednisone Other (See Comments)    Unknown/ sometimes takes with lower dose   Statins Other (See Comments)    Joint pain    Sulfa Antibiotics Other (See Comments)    Joint pain    Review of Systems  Musculoskeletal:  Positive for back pain (middle right back pain).       Objective:    Physical Exam  There were no vitals taken for this visit. Wt Readings from Last 3 Encounters:  04/19/23 214 lb 12.8 oz (97.4 kg)  04/12/23 211 lb 8 oz (95.9 kg)  04/09/23 209 lb 9.6 oz (95.1 kg)   Gen: Awake, alert, no acute distress Resp: Breathing is even and non-labored Psych: calm/pleasant demeanor Neuro: Alert and Oriented x 3, + facial symmetry, speech is clear.      Assessment & Plan:   Problem List Items Addressed This Visit       Unprioritized   Strain of lumbar region - Primary    New. Advised pt against use of meloxicam/nsaids due to his history of renal insufficiency.  He has had an undesirable side effect with prednisone but can't remember what it was.  Will rx with robaxin, Extra strength tylenol, heating pad prn. Pt is advised to call if symptoms worsen or if symptoms are not improved in 1 week.        Meds ordered this encounter  Medications   methocarbamol (  ROBAXIN) 500 MG tablet    Sig: Take 1 tablet (500 mg total) by mouth every 8 (eight) hours as needed for muscle spasms.    Dispense:  20 tablet    Refill:  0    Order Specific Question:   Supervising Provider    Answer:   Danise Edge A [[id number]]    I discussed the assessment and treatment plan with the patient. The patient was provided an opportunity to ask questions and all were answered. The patient agreed with the plan and demonstrated an understanding of the instructions.   The patient was advised to call back or seek  an in-person evaluation if the symptoms worsen or if the condition fails to improve as anticipated.  Johann Capers O'Sullivan,acting as a Neurosurgeon for Lemont Fillers, NP.,have documented all relevant documentation on the behalf of Lemont Fillers, NP,as directed by  Lemont Fillers, NP while in the presence of Lemont Fillers, NP.    Lemont Fillers, NP  [facility] [phone] (phone) [phone] (fax)  [facility]

## 2023-04-20 NOTE — Assessment & Plan Note (Signed)
New. Advised pt against use of meloxicam/nsaids due to his history of renal insufficiency.  He has had an undesirable side effect with prednisone but can't remember what it was.  Will rx with robaxin, Extra strength tylenol, heating pad prn. Pt is advised to call if symptoms worsen or if symptoms are not improved in 1 week.

## 2023-04-26 NOTE — Progress Notes (Signed)
[facility] PRE[facility] Weekly Session  Patient Details  Name: Martin Rubio MRN: [id number] Date of Birth: 1946-05-12 Age: 77 y.o. PCP: Wanda Plump, MD  Vitals:   04/26/23 1646  Weight: 212 lb 6.4 oz (96.3 kg)     [facility] Weekly seesion - 04/26/23 1600       [facility] "PREP" Location   [facility] "PREP" Location [facility]      Weekly Session   Topic Discussed Health habits   Sugar demo; water: 1/2 body wt in oz   Minutes exercised this week 30 minutes    Classes attended to date 34             Sonia Baller 04/26/2023, 4:47 PM

## 2023-04-27 ENCOUNTER — Telehealth: Payer: Self-pay | Admitting: Internal Medicine

## 2023-04-27 DIAGNOSIS — S39012A Strain of muscle, fascia and tendon of lower back, initial encounter: Secondary | ICD-10-CM

## 2023-04-27 NOTE — Telephone Encounter (Signed)
Please advise- Pt seen by Melissa.

## 2023-04-27 NOTE — Addendum Note (Signed)
Addended by: Sandford Craze on: 04/27/2023 01:34 PM   Modules accepted: Orders

## 2023-04-27 NOTE — Telephone Encounter (Signed)
Pt called back to advise that his back pain is not any better and he was told to call back in. Pt said he is okay with being referred to the Sports Medicine office across the hall from [place].

## 2023-05-04 DIAGNOSIS — H524 Presbyopia: Secondary | ICD-10-CM | POA: Diagnosis not present

## 2023-05-04 DIAGNOSIS — H5202 Hypermetropia, left eye: Secondary | ICD-10-CM | POA: Diagnosis not present

## 2023-05-04 DIAGNOSIS — H43813 Vitreous degeneration, bilateral: Secondary | ICD-10-CM | POA: Diagnosis not present

## 2023-05-04 DIAGNOSIS — H5211 Myopia, right eye: Secondary | ICD-10-CM | POA: Diagnosis not present

## 2023-05-04 DIAGNOSIS — Z961 Presence of intraocular lens: Secondary | ICD-10-CM | POA: Diagnosis not present

## 2023-05-04 DIAGNOSIS — H52223 Regular astigmatism, bilateral: Secondary | ICD-10-CM | POA: Diagnosis not present

## 2023-05-10 ENCOUNTER — Encounter: Payer: Self-pay | Admitting: Internal Medicine

## 2023-05-10 ENCOUNTER — Ambulatory Visit (INDEPENDENT_AMBULATORY_CARE_PROVIDER_SITE_OTHER): Payer: Medicare Other | Admitting: Internal Medicine

## 2023-05-10 VITALS — BP 118/70 | HR 60 | Temp 98.0°F | Resp 12 | Ht 70.0 in | Wt 215.4 lb

## 2023-05-10 DIAGNOSIS — D6869 Other thrombophilia: Secondary | ICD-10-CM

## 2023-05-10 DIAGNOSIS — R739 Hyperglycemia, unspecified: Secondary | ICD-10-CM

## 2023-05-10 DIAGNOSIS — R42 Dizziness and giddiness: Secondary | ICD-10-CM

## 2023-05-10 DIAGNOSIS — I480 Paroxysmal atrial fibrillation: Secondary | ICD-10-CM

## 2023-05-10 DIAGNOSIS — E039 Hypothyroidism, unspecified: Secondary | ICD-10-CM | POA: Diagnosis not present

## 2023-05-10 NOTE — Patient Instructions (Addendum)
Continue checking your blood pressure BP GOAL is between 110/65 and  135/85. If it is consistently higher or lower, let me know   GO TO THE LAB : Get the blood work     GO TO THE FRONT DESK, PLEASE SCHEDULE YOUR APPOINTMENTS Come back for a checkup in 4 months 

## 2023-05-10 NOTE — Progress Notes (Unsigned)
Subjective:    Patient ID: Martin Rubio, male    DOB: 10/17/1946, 77 y.o.   MRN: [id number]  DOS:  05/10/2023 Type of visit - description: Follow-up  Chronic medical problems addressed. He did report an acute issue: For the last 3 days had episodes of vertigo, described as a spinning, some nausea, episodes lasted about 10 minutes. The last 1 happened at right before this visit. I ask if episodes were triggered by moving his head and he said "it is possible". The last episode happening when he was getting out of his car.  No headache, no chest pain.  No difficulty breathing or palpitations. Denies double vision, neck stiffness, slurred speech or facial numbness. History of optical migraines ("spots before my eyes") which has been more frequent lately but no headaches per se    Review of Systems See above   Past Medical History:  Diagnosis Date   Bronchitis    Diverticulosis    FH: BPH (benign prostatic hypertrophy)    History of Graves' disease 01/19/2018   S/p ablation   HTN (hypertension)    Hyperlipidemia    Obesity    Psoriatic arthritis (HCC)    Rheumatoid arthritis (HCC) 08/07/2014   Sleep apnea    CPAP   Thyroid disease     Past Surgical History:  Procedure Laterality Date   ABDOMINAL AORTOGRAM W/LOWER EXTREMITY N/A 01/14/2022   Procedure: ABDOMINAL AORTOGRAM W/LOWER EXTREMITY;  Surgeon: Iran Ouch, MD;  Location: [facility];  Service: Cardiovascular;  Laterality: N/A;   ATRIAL FIBRILLATION ABLATION N/A 12/24/2022   Procedure: ATRIAL FIBRILLATION ABLATION;  Surgeon: Maurice Small, MD;  Location: [facility];  Service: Cardiovascular;  Laterality: N/A;   INGUINAL HERNIA REPAIR     bilateral   KNEE SURGERY     bilateral arthroscopic   l foot surgery Left 12/2020   PACEMAKER IMPLANT N/A 12/13/2019   Procedure: PACEMAKER IMPLANT;  Surgeon: Marinus Maw, MD;  Location: [facility];  Service: Cardiovascular;  Laterality: N/A;    TRANSURETHRAL RESECTION OF PROSTATE     UMBILICAL HERNIA REPAIR      Current Outpatient Medications  Medication Instructions   acetaminophen (TYLENOL) 1,000 mg, Oral, Every 6 hours PRN   amLODipine (NORVASC) 5 mg, Oral, Daily   cholecalciferol (VITAMIN D3) 2,000 Units, Oral, Daily   diclofenac Sodium (VOLTAREN) 4 g, Topical, 2 times daily   dofetilide (TIKOSYN) 250 MCG capsule TAKE ONE CAPSULE BY MOUTH TWICE A DAY   Enbrel SureClick 50 mg, Subcutaneous, Weekly, Friday or Saturday   ferrous fumarate (HEMOCYTE - 106 MG FE) 325 (106 Fe) MG TABS tablet 106 mg of iron, Oral, 2 times daily   fluticasone (FLONASE) 50 MCG/ACT nasal spray 1 spray, Each Nare, As needed   hydrOXYzine (VISTARIL) 25 MG capsule TAKE ONE CAPSULE BY MOUTH EVERY 8 HOURS AS NEEDED   Krill Oil 350 mg, Oral, Every M-T-W-Th-Fr   levothyroxine (SYNTHROID) 137 mcg, Oral, Daily before breakfast   losartan (COZAAR) 50 mg, Oral, 2 times daily   meclizine (ANTIVERT) 25 mg, Oral, 3 times daily PRN   melatonin 5 mg, Oral, Daily at bedtime   methocarbamol (ROBAXIN) 500 mg, Oral, Every 8 hours PRN   OVER THE COUNTER MEDICATION 2 capsules, Oral, Daily, Nervprin - Healthy Nerve Support - Peripheral Neuropathy Relief    oxybutynin (DITROPAN-XL) 10 mg, Oral, Daily at bedtime   pantoprazole (PROTONIX) 40 mg, Oral, Daily   pregabalin (LYRICA) 50 mg,  Oral, Daily at bedtime   Repatha SureClick 140 mg, Subcutaneous, Every 14 days   rivaroxaban (XARELTO) 20 mg, Oral, Daily with supper, Dose change, please call [phone]   tadalafil (CIALIS) 20 mg, Oral, Daily PRN   tamsulosin (FLOMAX) 0.4 mg, Oral, Daily at bedtime   triamcinolone cream (KENALOG) 0.1 % 1 application , Topical, Daily PRN   Vitamin B 12 1,000 mcg, Oral, Daily       Objective:   Physical Exam BP 118/70 (BP Location: Left Arm, Cuff Size: Large)   Pulse 60   Temp 98 F (36.7 C) (Oral)   Resp 12   Ht 5\' 10"  (1.778 m)   Wt 215 lb 6.4 oz (97.7 kg)   SpO2 94%   BMI  30.91 kg/m  General:   Well developed, NAD, BMI noted. HEENT:  Normocephalic . Face symmetric, atraumatic Lungs:  CTA B Normal respiratory effort, no intercostal retractions, no accessory muscle use. Heart: RRR,  no murmur.  Lower extremities: no pretibial edema bilaterally  Skin: Not pale. Not jaundice Neurologic:  alert & oriented X3.  Speech normal, gait appropriate for age and unassisted. EOMI, face symmetric, motor symmetric, Romberg absent. Psych--  Cognition and judgment appear intact.  Cooperative with normal attention span and concentration.  Behavior appropriate. No anxious or depressed appearing.      Assessment     ASSESSMENT  (transfer to me 12/2019) Hyperglycemia HTN High cholesterol, seen at the lipid clinic Hypothyroidism  Psoriatic Arthritis Dr Dierdre Forth  Osteoporosis: on fosamax rx by PCP, start holiday by mid 2022 (see visit note from 02/29/2020) CKD mild,stable . Creatinine ~1.5 CV: -Paroxysmal A. Fib, dx 09/2019 anticoagulated.  Ablation 12/24/2022. -Sick sinus syndrome, s/p pacemaker (12/13/2019) -Septal hypertrophy per echo -Left common iliac aneurysm, AAA  - PVD OSA on CPAP BPH - sees urology HOH  Iron deficiency anemia: 12/02/2021 >> EGD (several polyps suggestive of FGP), C-scope:no polyps, no cancer. Pathology: benign H/o ocular migraines , dx by opht  per pt   PLAN Labs from 01/20/2023 reviewed: Potassium 4.3, creatinine 1.7, Vitamin D normal, CBC normal. Notes from cardiology and vascular surgery reviewed Vertigo: 3 episodes of vertigo as described above, no associated stroke symptoms, few months ago he complained of slurred speech and at the time we agreed on observation. He is high risk for strokes, recommend a brain MRI and seek medical attention if symptoms increase. Hyperglycemia: Per chart review, A1c 5.9 (January 2023) check A1c. Last TSH 0.31, Synthroid dose decreased, check TSH  Paroxysmal A-fib: Had an ablation 12/24/2022, since then  bevy rare device detected A-fib.  Next visit with cardiology 03-2024. PVD, aortic and iliac aneurysms:: Saw surgery 04/2023, next visit in 6 months. RTC 4 months.   12 HTN: Reports BPs checked twice daily and is always within range.  Continue amlodipine, losartan, last BMP 09/16/2022, creatinine 1.58, near baseline. Hypothyroidism: On Synthroid, check TSH Slurred speech: Quite atypical symptoms, lasted 3 seconds.  Physical exam is benign, had a carotid ultrasound 2021 with no stenosis.  We both agree on observation. Neuropathy: Since the last visit he started OTC he got online, reports symptoms are dramatically better Atrial fibrillation: Last visit with cardiology 11/02/2022, given increased A-fib burden and  symptoms was recommended an ablation. PVD, aortic and iliac aneurysms: Saw Dr. Kirke Corin, 09/01/2022, had symptomatic PVD , not amenable to endovascular intervention.  Was rec to continue with Dr. Sherral Hammers Preventive care: Strongly declines flu shot, benefits discussed. RTC 4 to 6 months.

## 2023-05-11 ENCOUNTER — Telehealth: Payer: Self-pay | Admitting: Internal Medicine

## 2023-05-11 ENCOUNTER — Telehealth (HOSPITAL_BASED_OUTPATIENT_CLINIC_OR_DEPARTMENT_OTHER): Payer: Self-pay

## 2023-05-11 ENCOUNTER — Encounter: Payer: Self-pay | Admitting: Family Medicine

## 2023-05-11 ENCOUNTER — Ambulatory Visit (INDEPENDENT_AMBULATORY_CARE_PROVIDER_SITE_OTHER): Payer: Medicare Other | Admitting: Family Medicine

## 2023-05-11 VITALS — BP 110/70 | Ht 70.0 in | Wt 215.0 lb

## 2023-05-11 DIAGNOSIS — S39012A Strain of muscle, fascia and tendon of lower back, initial encounter: Secondary | ICD-10-CM

## 2023-05-11 DIAGNOSIS — H814 Vertigo of central origin: Secondary | ICD-10-CM

## 2023-05-11 DIAGNOSIS — R42 Dizziness and giddiness: Secondary | ICD-10-CM

## 2023-05-11 LAB — TSH: TSH: 0.71 u[IU]/mL (ref 0.35–5.50)

## 2023-05-11 LAB — HEMOGLOBIN A1C: Hgb A1c MFr Bld: 5.5 % (ref 4.6–6.5)

## 2023-05-11 MED ORDER — METHOCARBAMOL 500 MG PO TABS: 500.0000 mg | ORAL_TABLET | Freq: Three times a day (TID) | ORAL | 1 refills | Status: DC | PRN

## 2023-05-11 NOTE — Telephone Encounter (Signed)
Order for MRI in chart placed yesterday- Pt has pacemaker. Unable to do MRI.

## 2023-05-11 NOTE — Patient Instructions (Signed)
You have a lumbar strain. Ok to take tylenol for baseline pain relief (1-2 extra strength tabs 3x/day) Robaxin as needed for muscle spasms (no driving on this medicine if it makes you sleepy). Stay as active as possible. Do home exercises and stretches as directed - hold each for 20-30 seconds and do each one three times. Start physical therapy. Strengthening of low back muscles, abdominal musculature are key for long term pain relief. If not improving, will consider further imaging (MRI). Follow up with me in 1 month or as needed if you're doing well.

## 2023-05-11 NOTE — Addendum Note (Signed)
Addended byConrad Ipswich D on: 05/11/2023 02:04 PM   Modules accepted: Orders

## 2023-05-11 NOTE — Assessment & Plan Note (Signed)
Labs from 01/20/2023 reviewed: Potassium 4.3, creatinine 1.7, Vitamin D normal, CBC normal. Notes from cardiology and vascular surgery reviewed Vertigo: 3 episodes of vertigo as described above, no associated stroke symptoms, few months ago he complained of slurred speech and at the time we agreed on observation. He is high risk for strokes, recommend a brain MRI and seek medical attention if symptoms increase. Addendum, change to head CT, has a pacemaker Hyperglycemia: Per chart review, A1c 5.9 (January 2023) check A1c. Hypothyroidism: Last TSH 0.31, Synthroid dose decreased, check TSH Paroxysmal A-fib: Had an ablation 12/24/2022, since then bevy rare device detected A-fib.  Next visit with cardiology 03-2024. PVD, aortic and iliac aneurysms:: Saw surgery 04/2023, next visit in 6 months. RTC 4 months.

## 2023-05-11 NOTE — Telephone Encounter (Signed)
Changed to CT head without, Dx vertigo

## 2023-05-11 NOTE — Telephone Encounter (Signed)
MRI cancelled, CT head w/o ordered.

## 2023-05-12 NOTE — Progress Notes (Signed)
PCP: Wanda Plump, MD  Subjective:   HPI: Patient is a 77 y.o. male here for low back pain.  Patient reports he's had about 2-3 weeks of right sided low back pain. No acute injury or trauma. No radiation into extremities. No numbness or tingling. Robaxin from PCP's office helped as well as using back machine at [facility].  Past Medical History:  Diagnosis Date   Bronchitis    Diverticulosis    FH: BPH (benign prostatic hypertrophy)    History of Graves' disease 01/19/2018   S/p ablation   HTN (hypertension)    Hyperlipidemia    Obesity    Psoriatic arthritis (HCC)    Rheumatoid arthritis (HCC) 08/07/2014   Sleep apnea    CPAP   Thyroid disease     Current Outpatient Medications on File Prior to Visit  Medication Sig Dispense Refill   acetaminophen (TYLENOL) 500 MG tablet Take 1,000 mg by mouth every 6 (six) hours as needed (pain.).     amLODipine (NORVASC) 5 MG tablet Take 1 tablet (5 mg total) by mouth daily. 90 tablet 3   cholecalciferol (VITAMIN D3) 25 MCG (1000 UNIT) tablet Take 2,000 Units by mouth daily in the afternoon.     Cyanocobalamin (VITAMIN B 12) 500 MCG TABS Take 1,000 mcg by mouth daily in the afternoon.     diclofenac Sodium (VOLTAREN) 1 % GEL Apply 4 g topically in the morning and at bedtime. (Patient taking differently: Apply 4 g topically 2 (two) times daily as needed (knee pain.).) 100 g 6   dofetilide (TIKOSYN) 250 MCG capsule TAKE ONE CAPSULE BY MOUTH TWICE A DAY 180 capsule 3   ENBREL SURECLICK 50 MG/ML injection Inject 50 mg into the skin once a week. Friday or Saturday     Evolocumab (REPATHA SURECLICK) 140 MG/ML SOAJ Inject 140 mg into the skin every 14 (fourteen) days. 2 mL 11   ferrous fumarate (HEMOCYTE - 106 MG FE) 325 (106 Fe) MG TABS tablet Take 1 tablet (106 mg of iron total) by mouth 2 (two) times daily. (Patient taking differently: Take 1 tablet by mouth daily.) 180 tablet 1   fluticasone (FLONASE) 50 MCG/ACT nasal spray Place 1 spray into both  nostrils as needed for allergies.     hydrOXYzine (VISTARIL) 25 MG capsule TAKE ONE CAPSULE BY MOUTH EVERY 8 HOURS AS NEEDED 60 capsule 0   Krill Oil 350 MG CAPS Take 350 mg by mouth every Monday, Tuesday, Wednesday, Thursday, and Friday.     levothyroxine (SYNTHROID) 137 MCG tablet Take 1 tablet (137 mcg total) by mouth daily before breakfast. 90 tablet 1   losartan (COZAAR) 50 MG tablet TAKE 1 TABLET (50 MG TOTAL) BY MOUTH IN THE MORNING AND AT BEDTIME. (Patient taking differently: Take 50 mg by mouth 2 (two) times daily as needed (elevated blood pressure.).) 180 tablet 3   meclizine (ANTIVERT) 25 MG tablet Take 25 mg by mouth 3 (three) times daily as needed for dizziness.     Melatonin 5 MG TABS Take 5 mg by mouth at bedtime.     OVER THE COUNTER MEDICATION Take 2 capsules by mouth daily in the afternoon. Nervprin - Healthy Nerve Support - Peripheral Neuropathy Relief     oxybutynin (DITROPAN-XL) 10 MG 24 hr tablet Take 10 mg by mouth at bedtime.     pantoprazole (PROTONIX) 40 MG tablet Take 1 tablet (40 mg total) by mouth daily. 30 tablet 3   pregabalin (LYRICA) 50 MG capsule Take 50  mg by mouth at bedtime.     rivaroxaban (XARELTO) 20 MG TABS tablet Take 1 tablet (20 mg total) by mouth daily with supper. Dose change, please call [phone] 90 tablet 0   tadalafil (CIALIS) 20 MG tablet Take 20 mg by mouth daily as needed for erectile dysfunction.     tamsulosin (FLOMAX) 0.4 MG CAPS capsule Take 0.4 mg by mouth at bedtime.     triamcinolone cream (KENALOG) 0.1 % Apply 1 application topically daily as needed for itching.     No current facility-administered medications on file prior to visit.    Past Surgical History:  Procedure Laterality Date   ABDOMINAL AORTOGRAM W/LOWER EXTREMITY N/A 01/14/2022   Procedure: ABDOMINAL AORTOGRAM W/LOWER EXTREMITY;  Surgeon: Iran Ouch, MD;  [facility];  Service: Cardiovascular;  Laterality: N/A;   ATRIAL FIBRILLATION ABLATION N/A  12/24/2022   Procedure: ATRIAL FIBRILLATION ABLATION;  Surgeon: Maurice Small, MD;  [facility]: [facility];  Service: Cardiovascular;  Laterality: N/A;   INGUINAL HERNIA REPAIR     bilateral   KNEE SURGERY     bilateral arthroscopic   l foot surgery Left 12/2020   PACEMAKER IMPLANT N/A 12/13/2019   Procedure: PACEMAKER IMPLANT;  Surgeon: Marinus Maw, MD;  [facility]: [facility];  Service: Cardiovascular;  Laterality: N/A;   TRANSURETHRAL RESECTION OF PROSTATE     UMBILICAL HERNIA REPAIR      Allergies  Allergen Reactions   Codeine     Tension, nasty feeling    Nsaids Other (See Comments)    Renal insufficiency   Prednisone Other (See Comments)    Unknown/ sometimes takes with lower dose   Statins Other (See Comments)    Joint pain    Sulfa Antibiotics Other (See Comments)    Joint pain    BP 110/70 (BP Location: Left Arm, Patient Position: Sitting)   Ht 5\' 10"  (1.778 m)   Wt 215 lb (97.5 kg)   BMI 30.85 kg/m       No data to display              No data to display              Objective:  Physical Exam:  Gen: NAD, comfortable in exam room  Back: No gross deformity, scoliosis. TTP right lumbar paraspinal region.  No midline or bony TTP. FROM. Strength LEs 5/5 all muscle groups.   1+ MSRs in patellar and achilles tendons, equal bilaterally. Negative SLRs. Sensation intact to light touch bilaterally.   Assessment & Plan:  1. Low back pain - no red flag signs or symptoms.  Consistent with lumbar strain.  Start physical therapy and home exercises.  Continue robaxin as needed.  F/u in 1 month or prn.

## 2023-05-13 ENCOUNTER — Ambulatory Visit (HOSPITAL_BASED_OUTPATIENT_CLINIC_OR_DEPARTMENT_OTHER)
Admission: RE | Admit: 2023-05-13 | Discharge: 2023-05-13 | Disposition: A | Discharge status: Home / Self Care | Payer: Medicare Other | Source: Ambulatory Visit | Attending: Internal Medicine | Admitting: Internal Medicine

## 2023-05-13 DIAGNOSIS — R42 Dizziness and giddiness: Secondary | ICD-10-CM | POA: Diagnosis not present

## 2023-05-17 NOTE — Progress Notes (Signed)
[facility] PREP Weekly Session  Patient Details  Name: Martin Rubio MRN: [id number] Date of Birth: 1946-08-13 Age: 77 y.o. PCP: Wanda Plump, MD  Vitals:   05/17/23 1638  Weight: 214 lb 6.4 oz (97.3 kg)     [facility] Weekly seesion - 05/17/23 1600       [facility] "PREP" Location   [facility] "PREP" Location [facility]      Weekly Session   Topic Discussed Stress management and problem solving   importance of sleep: 7-9 hrs; finger tip mudra breath work for stress reduction   Classes attended to date 7             Sonia Baller 05/17/2023, 4:39 PM

## 2023-05-24 NOTE — Progress Notes (Signed)
[facility] PREP Weekly Session  Patient Details  Name: Martin Rubio MRN: [id number] Date of Birth: [birth year] Age: 77 y.o. PCP: Wanda Plump, MD  Vitals:   05/24/23 1633  Weight: 213 lb (96.6 kg)     [facility] Weekly seesion - 05/24/23 1600       [facility] "PREP" Location   [facility] "PREP" Location [facility]      Weekly Session   Topic Discussed Expectations and non-scale victories   Halfway through program, time to review/revisit/restate/reflect on goals; staying postive   Classes attended to date 10             Sonia Baller 05/24/2023, 4:34 PM

## 2023-05-31 NOTE — Therapy (Signed)
OUTPATIENT PHYSICAL THERAPY THORACOLUMBAR EVALUATION   Patient Name: Martin Rubio MRN: [id number] DOB:1946-05-01, 77 y.o., male Today's Date: 06/02/2023  END OF SESSION:  PT End of Session - 06/02/23 0850     Visit Number 1    Date for PT Re-Evaluation 07/14/23    Authorization Type Medicare & [employer]    PT Start Time 0850    PT Stop Time 0944    PT Time Calculation (min) 54 min    Activity Tolerance Patient tolerated treatment well    Behavior During Therapy [facility] for tasks assessed/performed             Past Medical History:  Diagnosis Date   Bronchitis    Diverticulosis    FH: BPH (benign prostatic hypertrophy)    History of Graves' disease 01/19/2018   S/p ablation   HTN (hypertension)    Hyperlipidemia    Obesity    Psoriatic arthritis (HCC)    Rheumatoid arthritis (HCC) 08/07/2014   Sleep apnea    CPAP   Thyroid disease    Past Surgical History:  Procedure Laterality Date   ABDOMINAL AORTOGRAM W/LOWER EXTREMITY N/A 01/14/2022   Procedure: ABDOMINAL AORTOGRAM W/LOWER EXTREMITY;  Surgeon: Iran Ouch, MD;  [facility];  Service: Cardiovascular;  Laterality: N/A;   ATRIAL FIBRILLATION ABLATION N/A 12/24/2022   Procedure: ATRIAL FIBRILLATION ABLATION;  Surgeon: Maurice Small, MD;  [facility];  Service: Cardiovascular;  Laterality: N/A;   INGUINAL HERNIA REPAIR     bilateral   KNEE SURGERY     bilateral arthroscopic   l foot surgery Left 12/2020   PACEMAKER IMPLANT N/A 12/13/2019   Procedure: PACEMAKER IMPLANT;  Surgeon: Marinus Maw, MD;  [facility];  Service: Cardiovascular;  Laterality: N/A;   TRANSURETHRAL RESECTION OF PROSTATE     UMBILICAL HERNIA REPAIR     Patient Active Problem List   Diagnosis Date Noted   Strain of lumbar region 04/20/2023   Dyspepsia 02/25/2023   Stage 3b chronic kidney disease (HCC) 07/15/2022   Aortic atherosclerosis (HCC) 03/18/2021   Bruit 02/09/2021   SOB  (shortness of breath) 02/09/2021   Aortic valve sclerosis 02/09/2021   Hypercoagulable state due to paroxysmal atrial fibrillation (HCC) 03/05/2020   PCP notes >>>>>>>>>>>>>>> 01/10/2020   Age related osteoporosis 01/10/2020   CKD (chronic kidney disease), stage IV (HCC) 12/16/2019   Pacemaker 12/16/2019   Sick sinus syndrome (HCC) 12/16/2019   Paroxysmal atrial fibrillation (HCC) 10/26/2019   Nodule of flexor tendon sheath 04/13/2019   Hemorrhoids 06/24/2018   Low bone mass 01/19/2018   Iliac aneurysm (HCC) 06/16/2017   Hypertrophic cardiomyopathy (HCC) 06/16/2017   Essential hypertension 12/16/2016   Hyperlipidemia LDL goal <100 12/16/2016   Asthma in adult, mild intermittent, uncomplicated 10/12/2016   Collapsed vertebra, not elsewhere classified, cervical region, initial encounter for fracture (HCC) 10/08/2016   Annual physical exam 10/08/2016   Neuropathy 10/08/2016   Contracture of ankle and foot joint 08/07/2014   Dysphagia 08/07/2014   Esophageal reflux 08/07/2014   Erectile dysfunction 08/07/2014   Obstructive sleep apnea 08/07/2014   Osteoarthritis 08/07/2014   Rheumatoid arthritis (HCC) 08/07/2014   Sensorineural hearing loss 08/07/2014   Tinnitus 08/07/2014   LVH (left ventricular hypertrophy) 12/25/2012   Diverticulosis 09/01/2011   White coat hypertension 07/07/2011   Hypothyroidism 06/09/2011   Postablative hypothyroidism 06/09/2011   Psoriatic arthritis (HCC) 06/09/2011   Benign prostatic hyperplasia 06/09/2011    PCP: Drue Novel,  Nolon Rod, MD   REFERRING PROVIDER: Lenda Kelp, MD   REFERRING DIAG: [phone] (ICD-10-CM) - Lumbar strain, initial encounter   THERAPY DIAG:  Other low back pain  Muscle spasm of back  Muscle weakness (generalized)  Abnormal posture  Difficulty in walking, not elsewhere classified  RATIONALE FOR EVALUATION AND TREATMENT: Rehabilitation  ONSET DATE: ~2 months  NEXT MD VISIT: None scheduled   SUBJECTIVE:                                                                                                                                                                                                          SUBJECTIVE STATEMENT: Pt reports severe back pain started 2 months ago with a "knot" in his R flank. Pain has subsided somewhat but still feels like the knot is there. Pain limits walking tolerance - used to walk loop at [facility] but only able to walk across bridge currently. Pt also notes he feels more wobbly, worsening recently potentially secondary to decreased activity as a result of the back pain. Participates in the "PREP" program at the [facility] - education on diet and exercise.  PAIN: Are you having pain? Yes: NPRS scale: 0/10 currently, up to 3-4/10 Pain location: R>L mid/low back Pain description: "knot", sore Aggravating factors: back extension machine at gym Relieving factors: laying flat on his back   PERTINENT HISTORY:  Atrial fibrillation ablation 12/24/22; SSS s/p pacemaker 12/13/19; B knee scope; L foot surgery; umbilical and B inguinal hernia repairs; HTN; osteoporosis; OA; psoriatic arthritis; RA; cervical vertebral fracture; thyroid disease - hypothyroidism; h/o Graves' disease; CKD; GERD; neuropathy; hearing loss; acute vertigo as of early June   PRECAUTIONS: None  WEIGHT BEARING RESTRICTIONS: No  FALLS:  Has patient fallen in last 6 months? No  LIVING ENVIRONMENT: Lives with: lives with their spouse Lives in: House/apartment Stairs: Yes: Internal: 8 or 7 steps; on right going up and on left going up and External: 1 steps; none (split level home) Has following equipment at home: Quad cane small base and Grab bars  OCCUPATION: Retired  PLOF: Independent and Leisure: walking with wife at [facility], participates in [place] "PREP" Program at the [employer]    PATIENT GOALS: "Walk steadily or stand for 1+ hours w/o pain."   OBJECTIVE: (objective measures completed at  initial evaluation unless otherwise dated)  DIAGNOSTIC FINDINGS:  No current imaging available.  04/25/20 - Lumbar spine x-ray: FINDINGS: No fracture or spondylolisthesis is noted. Mild degenerative disc disease is noted at L3-4 with anterior osteophyte formation.  Atherosclerosis of abdominal aorta is noted.  IMPRESSION: Mild degenerative disc disease is noted at L3-4. No acute abnormality seen in the lumbar spine.  04/25/20 - Thoracic spine x-ray: FINDINGS: No fracture or spondylolisthesis is noted. Mild S-shaped scoliosis of thoracic spine is noted. Osteophyte formation is noted at multiple levels in the middle and lower thoracic spine.   IMPRESSION: No acute abnormality seen in the thoracic spine. Multilevel degenerative disc disease is noted.  PATIENT SURVEYS:  Modified Oswestry 17 / 50 = 34.0 %  FOTO Lumbar = 47, predicted = 58  SCREENING FOR RED FLAGS: Bowel or bladder incontinence: No Spinal tumors: No Cauda equina syndrome: No Compression fracture: No Abdominal aneurysm: No  COGNITION:  Overall cognitive status: Within functional limits for tasks assessed and mild memory/word finding deficits     SENSATION: LE peripheral neuropathy  MUSCLE LENGTH: Hamstrings: mod tight B ITB: mild/mod tight B Piriformis: mod tight R, mild/mod tight L Hip flexors: mod/severe tight B Quads: mod tight B Heelcord: NT  POSTURE:  weight shift left  PALPATION: Increased muscle tension with R > L tenderness to palpation in QL as well as lateral glutes and piriformis  LUMBAR ROM:   Active  eval  Flexion Hands to mid shins - tight HS  Extension 40% limited - ^ R back pain  Right lateral flexion WFL  Left lateral flexion WFL - ^ R back pain  Right rotation 20% limited - ^ R back pain  Left rotation WFL  (Blank rows = not tested)  LOWER EXTREMITY ROM:    B LE grossly WFL other than mild limitations in hip rotation  LOWER EXTREMITY MMT:    MMT  Right eval Left eval  Hip  flexion 4+ 4+  Hip extension 4+ 4  Hip abduction 4- 4-  Hip adduction 4 3+  Hip internal rotation 4+ 4+  Hip external rotation 4- 4-  Knee flexion 5 5  Knee extension 5 5  Ankle dorsiflexion 4- 4-  Ankle plantarflexion 4- 3+  Ankle inversion    Ankle eversion    (Blank rows = not tested)  LUMBAR SPECIAL TESTS:  Straight leg raise test: Negative and Slump test: Negative  FUNCTIONAL TESTS: TBD next visit 5 times sit to stand:   Timed up and go (TUG):   10 meter walk test:   Functional gait assessment:    GAIT: Distance walked: 80 ft Assistive device utilized: None Level of assistance: Complete Independence Gait pattern: lateral lean- Left Comments:    TODAY'S TREATMENT:   06/02/23 Eval only   PATIENT EDUCATION:  Education details: PT eval findings, anticipated POC, and need for further assessment of balance Person educated: Patient Education method: Explanation Education comprehension: verbalized understanding  HOME EXERCISE PROGRAM: TBD   ASSESSMENT:  CLINICAL IMPRESSION: Martin Rubio is a 77 y.o. male who was seen today for physical therapy evaluation and treatment for R-sided LBP secondary to lumbar strain.  He reports onset of pain approximately 2 months ago without known MOI but notes initially felt a "knot" in his right low back which actually subsiding.  Pain also no longer as intense and now more intermittent in nature with no radicular pain or neurological symptoms reported.  Current deficits include limited lumbar ROM, decreased proximal LE flexibility, abnormal muscle tension, core and lower extremity weakness, and reported decreased balance/stability.  Further testing to identify balance deficits indicated and will be completed next visit.  Pain and other deficits prevent patient from walking for exercise as he  would like to, as well as limits his standing tolerance.  Martin "Theodoro Grist" will benefit from skilled PT to address above deficits to improve mobility  and activity tolerance with decreased pain interference.   OBJECTIVE IMPAIRMENTS: decreased activity tolerance, decreased balance, decreased endurance, decreased knowledge of condition, decreased mobility, difficulty walking, decreased ROM, decreased strength, hypomobility, increased fascial restrictions, impaired perceived functional ability, increased muscle spasms, impaired flexibility, improper body mechanics, postural dysfunction, and pain.   ACTIVITY LIMITATIONS: carrying, lifting, bending, sitting, standing, squatting, stairs, bed mobility, locomotion level, and caring for others  PARTICIPATION LIMITATIONS: cleaning, community activity, and yard work  PERSONAL FACTORS: Past/current experiences, Time since onset of injury/illness/exacerbation, and 3+ comorbidities: Atrial fibrillation ablation 12/24/22; SSS s/p pacemaker 12/13/19; B knee scope; L foot surgery; umbilical and B inguinal hernia repairs; HTN; osteoporosis; OA; psoriatic arthritis; RA; cervical vertebral fracture; thyroid disease - hypothyroidism; h/o Graves' disease; CKD; GERD; neuropathy; hearing loss; acute vertigo as of early June   are also affecting patient's functional outcome.   REHAB POTENTIAL: Good  CLINICAL DECISION MAKING: Stable/uncomplicated  EVALUATION COMPLEXITY: Low   GOALS: Goals reviewed with patient? Yes  SHORT TERM GOALS: Target date: 06/23/2023  Patient will be independent with initial HEP to improve outcomes and carryover.  Baseline:  Goal status: INITIAL  LONG TERM GOALS: Target date: 07/14/2023  Patient will be independent with ongoing/advanced HEP for self-management at home.  Baseline:  Goal status: INITIAL  2.  Patient will report 50-75% improvement in low back pain to improve QOL.  Baseline: back pain recently up to 3-4/10 Goal status: INITIAL  3.  Patient to demonstrate ability to achieve and maintain good spinal alignment/posturing and body mechanics needed for daily  activities. Baseline:  Goal status: INITIAL  4.  Patient will demonstrate functional pain free lumbar ROM to perform ADLs.   Baseline:  Goal status: INITIAL  5.  Patient will demonstrate improved B LE strength to grossly >/= 4+/5 for improved stability and ease of mobility . Baseline: Refer to above MMT table Goal status: INITIAL  6.  Patient will report 77 on lumbar FOTO to demonstrate improved functional ability.  Baseline: 47 Goal status: INITIAL   7. Patient will report </= 22% on Modified Oswestry to demonstrate improved functional ability with decreased pain interference. Baseline: 17 / 50 = 34.0 % Goal status: INITIAL  8.  Patient will tolerate >/= 1 hour of standing or walking w/o increased pain to allow for  improved mobility and activity tolerance. Baseline:  Goal status: INITIAL    PLAN:  PT FREQUENCY: 2x/week  PT DURATION: 6 weeks  PLANNED INTERVENTIONS: PatientTherapeutic exercises, Therapeutic activity, Neuromuscular re-education, Balance training, Gait training, Patient/Family education, Self Care, Joint mobilization, Aquatic Therapy, Dry Needling, Spinal manipulation, Spinal mobilization, Cryotherapy, Moist heat, Taping, Traction, Ultrasound, Manual therapy, and Re-evaluation  PLAN FOR NEXT SESSION: Complete balance assessment - 5xSTS, TUG, & FGA; create initial HEP for lumbopelvic flexibility and strengthening; MT +/- DN as indicated to address abnormal muscle tension   Marry Guan, PT 06/02/2023, 12:16 PM

## 2023-05-31 NOTE — Progress Notes (Signed)
[facility] PREP Weekly Session  Patient Details  Name: Martin Rubio MRN: [id number] Date of Birth: 10/14/1946 Age: 77 y.o. PCP: Wanda Plump, MD  Vitals:   05/31/23 1628  Weight: 214 lb 9.6 oz (97.3 kg)     [facility] Weekly seesion - 05/31/23 1600       [facility] "PREP" Location   [facility] "PREP" Location [facility]      Weekly Session   Topic Discussed Other   portion control; visualize your portion size demo; reveiw of Reduced sugar craisins nutritional/food label.   Minutes exercised this week 40 minutes    Classes attended to date 57             Aimie Wagman B Katheryn Culliton 05/31/2023, 4:30 PM

## 2023-06-01 DIAGNOSIS — L308 Other specified dermatitis: Secondary | ICD-10-CM | POA: Diagnosis not present

## 2023-06-02 ENCOUNTER — Other Ambulatory Visit: Payer: Self-pay

## 2023-06-02 ENCOUNTER — Encounter: Payer: Self-pay | Admitting: Physical Therapy

## 2023-06-02 ENCOUNTER — Ambulatory Visit: Payer: Medicare Other | Attending: Family Medicine | Admitting: Physical Therapy

## 2023-06-02 DIAGNOSIS — M62830 Muscle spasm of back: Secondary | ICD-10-CM

## 2023-06-02 DIAGNOSIS — M5459 Other low back pain: Secondary | ICD-10-CM | POA: Diagnosis not present

## 2023-06-02 DIAGNOSIS — M6281 Muscle weakness (generalized): Secondary | ICD-10-CM | POA: Diagnosis not present

## 2023-06-02 DIAGNOSIS — R262 Difficulty in walking, not elsewhere classified: Secondary | ICD-10-CM

## 2023-06-02 DIAGNOSIS — R293 Abnormal posture: Secondary | ICD-10-CM

## 2023-06-02 DIAGNOSIS — S39012A Strain of muscle, fascia and tendon of lower back, initial encounter: Secondary | ICD-10-CM | POA: Insufficient documentation

## 2023-06-07 ENCOUNTER — Other Ambulatory Visit: Payer: Self-pay | Admitting: Family Medicine

## 2023-06-07 DIAGNOSIS — R1013 Epigastric pain: Secondary | ICD-10-CM

## 2023-06-08 ENCOUNTER — Other Ambulatory Visit: Payer: Self-pay

## 2023-06-08 DIAGNOSIS — I480 Paroxysmal atrial fibrillation: Secondary | ICD-10-CM

## 2023-06-08 MED ORDER — RIVAROXABAN 20 MG PO TABS
20.0000 mg | ORAL_TABLET | Freq: Every day | ORAL | 1 refills | Status: DC
Start: 2023-06-08 — End: 2023-11-08

## 2023-06-08 MED ORDER — LEVOTHYROXINE SODIUM 137 MCG PO TABS: 137.0000 ug | ORAL_TABLET | Freq: Every day | ORAL | 1 refills | Status: DC

## 2023-06-08 NOTE — Telephone Encounter (Signed)
Prescription refill request for Xarelto received.  Indication:afib Last office visit:4/24 Weight:97.3  kg Age:77 Scr:1.62  3/24 CrCl:51.72  ml/min  Prescription refilled

## 2023-06-11 ENCOUNTER — Ambulatory Visit: Payer: Medicare Other | Admitting: Family

## 2023-06-11 VITALS — BP 150/68 | HR 60 | Temp 98.2°F | Resp 18 | Ht 70.0 in | Wt 215.4 lb

## 2023-06-11 DIAGNOSIS — H8110 Benign paroxysmal vertigo, unspecified ear: Secondary | ICD-10-CM

## 2023-06-11 DIAGNOSIS — I10 Essential (primary) hypertension: Secondary | ICD-10-CM | POA: Diagnosis not present

## 2023-06-11 DIAGNOSIS — R195 Other fecal abnormalities: Secondary | ICD-10-CM | POA: Diagnosis not present

## 2023-06-11 LAB — CBC WITH DIFFERENTIAL/PLATELET
Basophils Absolute: 0.1 10*3/uL (ref 0.0–0.1)
Basophils Relative: 0.8 % (ref 0.0–3.0)
Eosinophils Absolute: 0.3 10*3/uL (ref 0.0–0.7)
Eosinophils Relative: 3.8 % (ref 0.0–5.0)
HCT: 47.6 % (ref 39.0–52.0)
Hemoglobin: 15.4 g/dL (ref 13.0–17.0)
Lymphocytes Relative: 25.4 % (ref 12.0–46.0)
Lymphs Abs: 1.9 10*3/uL (ref 0.7–4.0)
MCHC: 32.3 g/dL (ref 30.0–36.0)
MCV: 86.8 fl (ref 78.0–100.0)
Monocytes Absolute: 0.9 10*3/uL (ref 0.1–1.0)
Monocytes Relative: 12.4 % — ABNORMAL HIGH (ref 3.0–12.0)
Neutro Abs: 4.3 10*3/uL (ref 1.4–7.7)
Neutrophils Relative %: 57.6 % (ref 43.0–77.0)
Platelets: 145 10*3/uL — ABNORMAL LOW (ref 150.0–400.0)
RBC: 5.49 Mil/uL (ref 4.22–5.81)
RDW: 15.9 % — ABNORMAL HIGH (ref 11.5–15.5)
WBC: 7.4 10*3/uL (ref 4.0–10.5)

## 2023-06-11 NOTE — Progress Notes (Signed)
Subjective:     Patient ID: Martin Rubio, male    DOB: 02-29-1946, 77 y.o.   MRN: [id number]  Chief Complaint  Patient presents with   Dizziness    HPI  Discussed the use of AI scribe software for clinical note transcription with the patient, who gave verbal consent to proceed.  History of Present Illness   The patient, with a history of vertigo and back pain, presents with worsening vertigo for the past week. The vertigo is described as intermittent, lasting for a few minutes at a time, and has caused nausea in the past two days. The patient denies positional triggers but admits that sudden movements can exacerbate the symptoms. The patient's spouse expresses concern about potential medication interactions causing the vertigo. The patient has been skipping certain medications, including albuterol and Lyrica, to see if they have any effect on the vertigo, but reports no change in symptoms.  In addition to vertigo, the patient reports having black stools for the past two days. The patient has been taking iron supplements for a long time and has recently consumed a lot of cherries. The patient denies any signs of bright red blood in the stool.  The patient also mentions having a pulled muscle in his back, for which he is scheduled to receive physical therapy.     BP Readings from Last 3 Encounters:  06/11/23 (!) 150/68  05/11/23 110/70  05/10/23 118/70        There are no preventive care reminders to display for this patient.  Past Medical History:  Diagnosis Date   Bronchitis    Diverticulosis    FH: BPH (benign prostatic hypertrophy)    History of Graves' disease 01/19/2018   S/p ablation   HTN (hypertension)    Hyperlipidemia    Obesity    Psoriatic arthritis (HCC)    Rheumatoid arthritis (HCC) 08/07/2014   Sleep apnea    CPAP   Thyroid disease     Past Surgical History:  Procedure Laterality Date   ABDOMINAL AORTOGRAM W/LOWER EXTREMITY N/A 01/14/2022    Procedure: ABDOMINAL AORTOGRAM W/LOWER EXTREMITY;  Surgeon: Iran Ouch, MD;  Location: [facility];  Service: Cardiovascular;  Laterality: N/A;   ATRIAL FIBRILLATION ABLATION N/A 12/24/2022   Procedure: ATRIAL FIBRILLATION ABLATION;  Surgeon: Maurice Small, MD;  Location: [facility];  Service: Cardiovascular;  Laterality: N/A;   INGUINAL HERNIA REPAIR     bilateral   KNEE SURGERY     bilateral arthroscopic   l foot surgery Left 12/2020   PACEMAKER IMPLANT N/A 12/13/2019   Procedure: PACEMAKER IMPLANT;  Surgeon: Marinus Maw, MD;  Location: [facility];  Service: Cardiovascular;  Laterality: N/A;   TRANSURETHRAL RESECTION OF PROSTATE     UMBILICAL HERNIA REPAIR      Family History  Problem Relation Age of Onset   CAD Mother 30       CABG   CAD Father 45       Died age 48   Diabetes Brother    Lung cancer Maternal Grandmother    Prostate cancer Neg Hx    Colon cancer Neg Hx     Social History   Socioeconomic History   Marital status: Married    Spouse name: Not on file   Number of children: 2   Years of education: Not on file   Highest education level: Not on file  Occupational History   Occupation: Retired  Employer: [employer]  Tobacco Use   Smoking status: Former    Types: Cigarettes   Smokeless tobacco: Never   Tobacco comments:    Former smoker Quit 46 years ago. 01/21/23  Vaping Use   Vaping Use: Never used  Substance and Sexual Activity   Alcohol use: No   Drug use: No   Sexual activity: Not on file  Other Topics Concern   Not on file  Social History Narrative   Lives with wife.   Social Determinants of Health   Financial Resource Strain: Low Risk  (01/26/2022)   Overall Financial Resource Strain (CARDIA)    Difficulty of Paying Living Expenses: Not hard at all  Food Insecurity: No Food Insecurity (01/26/2022)   Hunger Vital Sign    Worried About Running Out of Food in the Last Year: Never true    Ran Out of  Food in the Last Year: Never true  Transportation Needs: No Transportation Needs (01/26/2022)   PRAPARE - Administrator, Civil Service (Medical): No    Lack of Transportation (Non-Medical): No  Physical Activity: Inactive (01/26/2022)   Exercise Vital Sign    Days of Exercise per Week: 0 days    Minutes of Exercise per Session: 0 min  Stress: No Stress Concern Present (01/26/2022)   [employer] of Occupational Health - Occupational Stress Questionnaire    Feeling of Stress : Not at all  Social Connections: Moderately Integrated (01/26/2022)   Social Connection and Isolation Panel [NHANES]    Frequency of Communication with Friends and Family: Three times a week    Frequency of Social Gatherings with Friends and Family: Twice a week    Attends Religious Services: More than 4 times per year    Active Member of [employer] or Organizations: No    Attends Banker Meetings: Never    Marital Status: Married  Catering manager Violence: Not At Risk (01/26/2022)   Humiliation, Afraid, Rape, and Kick questionnaire    Fear of Current or Ex-Partner: No    Emotionally Abused: No    Physically Abused: No    Sexually Abused: No    Outpatient Medications Prior to Visit  Medication Sig Dispense Refill   acetaminophen (TYLENOL) 500 MG tablet Take 500 mg by mouth every 6 (six) hours as needed (pain.).     amLODipine (NORVASC) 5 MG tablet Take 1 tablet (5 mg total) by mouth daily. 90 tablet 3   cholecalciferol (VITAMIN D3) 25 MCG (1000 UNIT) tablet Take 2,000 Units by mouth daily in the afternoon.     Cyanocobalamin (VITAMIN B 12) 500 MCG TABS Take 1,000 mcg by mouth daily in the afternoon.     diclofenac Sodium (VOLTAREN) 1 % GEL Apply 4 g topically in the morning and at bedtime. (Patient taking differently: Apply 4 g topically 2 (two) times daily as needed (knee pain.).) 100 g 6   dofetilide (TIKOSYN) 250 MCG capsule TAKE ONE CAPSULE BY MOUTH TWICE A DAY 180 capsule 3   ENBREL  SURECLICK 50 MG/ML injection Inject 50 mg into the skin once a week. Friday or Saturday     Evolocumab (REPATHA SURECLICK) 140 MG/ML SOAJ Inject 140 mg into the skin every 14 (fourteen) days. 2 mL 11   ferrous fumarate (HEMOCYTE - 106 MG FE) 325 (106 Fe) MG TABS tablet Take 1 tablet (106 mg of iron total) by mouth 2 (two) times daily. (Patient taking differently: Take 1 tablet by mouth daily.) 180 tablet 1  fluticasone (FLONASE) 50 MCG/ACT nasal spray Place 1 spray into both nostrils as needed for allergies.     hydrOXYzine (VISTARIL) 25 MG capsule TAKE ONE CAPSULE BY MOUTH EVERY 8 HOURS AS NEEDED 60 capsule 0   Krill Oil 350 MG CAPS Take 350 mg by mouth every Monday, Tuesday, Wednesday, Thursday, and Friday.     levothyroxine (SYNTHROID) 137 MCG tablet Take 1 tablet (137 mcg total) by mouth daily before breakfast. 90 tablet 1   losartan (COZAAR) 50 MG tablet TAKE 1 TABLET (50 MG TOTAL) BY MOUTH IN THE MORNING AND AT BEDTIME. (Patient taking differently: Take 50 mg by mouth 2 (two) times daily as needed (elevated blood pressure.).) 180 tablet 3   meclizine (ANTIVERT) 25 MG tablet Take 25 mg by mouth 3 (three) times daily as needed for dizziness.     Melatonin 5 MG TABS Take 5 mg by mouth at bedtime.     methocarbamol (ROBAXIN) 500 MG tablet Take 1 tablet (500 mg total) by mouth every 8 (eight) hours as needed for muscle spasms. 30 tablet 1   OVER THE COUNTER MEDICATION Take 2 capsules by mouth daily in the afternoon. Nervprin - Healthy Nerve Support - Peripheral Neuropathy Relief     oxybutynin (DITROPAN-XL) 10 MG 24 hr tablet Take 10 mg by mouth at bedtime.     pantoprazole (PROTONIX) 40 MG tablet Take 1 tablet (40 mg total) by mouth daily. 90 tablet 1   pregabalin (LYRICA) 50 MG capsule Take 50 mg by mouth at bedtime.     rivaroxaban (XARELTO) 20 MG TABS tablet Take 1 tablet (20 mg total) by mouth daily with supper. Dose change, please call [phone] 90 tablet 1   tadalafil (CIALIS) 20 MG  tablet Take 20 mg by mouth daily as needed for erectile dysfunction.     tamsulosin (FLOMAX) 0.4 MG CAPS capsule Take 0.4 mg by mouth at bedtime.     triamcinolone cream (KENALOG) 0.1 % Apply 1 application topically daily as needed for itching.     No facility-administered medications prior to visit.    Allergies  Allergen Reactions   Codeine     Tension, nasty feeling    Nsaids Other (See Comments)    Renal insufficiency   Prednisone Other (See Comments)    Unknown/ sometimes takes with lower dose   Statins Other (See Comments)    Joint pain    Sulfa Antibiotics Other (See Comments)    Joint pain    ROS See HPI    Objective:    Physical Exam Chaperone present: wife present in room for prostate exam.  Constitutional:      General: He is not in acute distress.    Appearance: He is well-developed.  HENT:     Head: Normocephalic and atraumatic.  Cardiovascular:     Rate and Rhythm: Normal rate and regular rhythm.     Heart sounds: No murmur heard. Pulmonary:     Effort: Pulmonary effort is normal. No respiratory distress.     Breath sounds: Normal breath sounds. No wheezing or rales.  Genitourinary:    Prostate: Enlarged (mildly enlarged but smooth without palpable nodules).     Rectum: Guaiac result negative.     Comments: Hemoccult negative Skin:    General: Skin is warm and dry.  Neurological:     Mental Status: He is alert and oriented to person, place, and time.     Comments: + Agapito Games maneuver bilaterally R>L  Psychiatric:  Behavior: Behavior normal.        Thought Content: Thought content normal.      BP (!) 150/68   Pulse 60   Temp 98.2 F (36.8 C)   Resp 18   Ht 5\' 10"  (1.778 m)   Wt 215 lb 6.4 oz (97.7 kg)   SpO2 96%   BMI 30.91 kg/m  Wt Readings from Last 3 Encounters:  06/11/23 215 lb 6.4 oz (97.7 kg)  05/31/23 214 lb 9.6 oz (97.3 kg)  05/24/23 213 lb (96.6 kg)       Assessment & Plan:   Problem List Items Addressed This  Visit       Unprioritized   Essential hypertension    BP Readings from Last 3 Encounters:  06/11/23 (!) 150/68  05/11/23 110/70  05/10/23 118/70  BP up a bit today.  Advised pt to return for a repeat nurse visit while PCP is in office.  He states home readings are much better.       Dark stools    Hemoccult negative today in office.  Check CBC. Likely due to cherry intake and iron supplement. Monitor.      Relevant Orders   CBC w/Diff   Benign paroxysmal positional vertigo - Primary    Persistent vertigo for the past week, exacerbated by positional changes. No clear medication-related cause identified. Meclizine provides temporary relief. -Refer for vestibular rehabilitation therapy to help re-equilibrate inner ear crystals and improve balance.      Relevant Orders   Ambulatory referral to Physical Therapy    I am having Tilman Neat. Marty Heck" maintain his fluticasone, tamsulosin, triamcinolone cream, melatonin, cholecalciferol, Vitamin B 12, meclizine, losartan, diclofenac Sodium, Krill Oil, pregabalin, dofetilide, tadalafil, ferrous fumarate, Enbrel SureClick, oxybutynin, OVER THE COUNTER MEDICATION, acetaminophen, hydrOXYzine, amLODipine, Repatha SureClick, methocarbamol, pantoprazole, levothyroxine, and rivaroxaban.  No orders of the defined types were placed in this encounter.

## 2023-06-11 NOTE — Patient Instructions (Signed)
VISIT SUMMARY:  During your visit, we discussed your ongoing issues with vertigo, black stools, and back pain. We also talked about your self-regulated use of blood pressure medications. Your vertigo has been worsening over the past week, and we couldn't find a clear link to your medications.  We found no signs of blood in your stool. You also mentioned a pulled muscle in your back, for which you're scheduled to receive physical therapy.  YOUR PLAN:  -VERTIGO: Vertigo is a sensation of feeling off balance, often caused by problems in the inner ear or brain. We will refer you for vestibular rehabilitation therapy, which can help re-balance the inner ear crystals and improve your balance.  -BLACK STOOLS: Black stools can be a sign of bleeding in the digestive tract, but in your case, it could be due to your diet or iron supplements. We will order a Complete Blood Count (CBC) to check your blood count.  -HYPERTENSION: Hypertension, or high blood pressure, can lead to serious health problems if not managed properly. You should continue your current regimen and self-monitoring of your blood pressure.  INSTRUCTIONS:  Please contact your previous urologist, Dr. Corbin Ade, as you expressed interest in reconnecting with him. If you need a referral, please request one from Dr. Drue Novel.

## 2023-06-11 NOTE — Assessment & Plan Note (Signed)
Hemoccult negative today in office.  Check CBC. Likely due to cherry intake and iron supplement. Monitor.

## 2023-06-11 NOTE — Assessment & Plan Note (Signed)
BP Readings from Last 3 Encounters:  06/11/23 (!) 150/68  05/11/23 110/70  05/10/23 118/70   BP up a bit today.  Advised pt to return for a repeat nurse visit while PCP is in office.  He states home readings are much better.

## 2023-06-11 NOTE — Assessment & Plan Note (Signed)
Persistent vertigo for the past week, exacerbated by positional changes. No clear medication-related cause identified. Meclizine provides temporary relief. -Refer for vestibular rehabilitation therapy to help re-equilibrate inner ear crystals and improve balance.

## 2023-06-12 ENCOUNTER — Other Ambulatory Visit: Payer: Self-pay | Admitting: Cardiovascular Disease

## 2023-06-13 NOTE — Therapy (Signed)
OUTPATIENT PHYSICAL THERAPY THORACOLUMBAR TREATMENT   Patient Name: Martin Rubio MRN: [id number] DOB:05-25-1946, 77 y.o., male Today's Date: 06/14/2023  END OF SESSION:  PT End of Session - 06/14/23 1027     Visit Number 2    Date for PT Re-Evaluation 07/14/23    Authorization Type Medicare & [employer]    PT Start Time 1021    PT Stop Time 1104    PT Time Calculation (min) 43 min    Activity Tolerance Patient tolerated treatment well    Behavior During Therapy [facility] for tasks assessed/performed             Past Medical History:  Diagnosis Date   Bronchitis    Diverticulosis    FH: BPH (benign prostatic hypertrophy)    History of Graves' disease 01/19/2018   S/p ablation   HTN (hypertension)    Hyperlipidemia    Obesity    Psoriatic arthritis (HCC)    Rheumatoid arthritis (HCC) 08/07/2014   Sleep apnea    CPAP   Thyroid disease    Past Surgical History:  Procedure Laterality Date   ABDOMINAL AORTOGRAM W/LOWER EXTREMITY N/A 01/14/2022   Procedure: ABDOMINAL AORTOGRAM W/LOWER EXTREMITY;  Surgeon: Iran Ouch, MD;  Location: [facility];  Service: Cardiovascular;  Laterality: N/A;   ATRIAL FIBRILLATION ABLATION N/A 12/24/2022   Procedure: ATRIAL FIBRILLATION ABLATION;  Surgeon: Maurice Small, MD;  Location: [facility];  Service: Cardiovascular;  Laterality: N/A;   INGUINAL HERNIA REPAIR     bilateral   KNEE SURGERY     bilateral arthroscopic   l foot surgery Left 12/2020   PACEMAKER IMPLANT N/A 12/13/2019   Procedure: PACEMAKER IMPLANT;  Surgeon: Marinus Maw, MD;  Location: [facility];  Service: Cardiovascular;  Laterality: N/A;   TRANSURETHRAL RESECTION OF PROSTATE     UMBILICAL HERNIA REPAIR     Patient Active Problem List   Diagnosis Date Noted   Benign paroxysmal positional vertigo 06/11/2023   Dark stools 06/11/2023   Strain of lumbar region 04/20/2023   Dyspepsia 02/25/2023   Stage 3b chronic kidney disease (HCC)  07/15/2022   Aortic atherosclerosis (HCC) 03/18/2021   Bruit 02/09/2021   SOB (shortness of breath) 02/09/2021   Aortic valve sclerosis 02/09/2021   Hypercoagulable state due to paroxysmal atrial fibrillation (HCC) 03/05/2020   PCP notes >>>>>>>>>>>>>>> 01/10/2020   Age related osteoporosis 01/10/2020   CKD (chronic kidney disease), stage IV (HCC) 12/16/2019   Pacemaker 12/16/2019   Sick sinus syndrome (HCC) 12/16/2019   Paroxysmal atrial fibrillation (HCC) 10/26/2019   Nodule of flexor tendon sheath 04/13/2019   Hemorrhoids 06/24/2018   Low bone mass 01/19/2018   Iliac aneurysm (HCC) 06/16/2017   Hypertrophic cardiomyopathy (HCC) 06/16/2017   Essential hypertension 12/16/2016   Hyperlipidemia LDL goal <100 12/16/2016   Asthma in adult, mild intermittent, uncomplicated 10/12/2016   Collapsed vertebra, not elsewhere classified, cervical region, initial encounter for fracture (HCC) 10/08/2016   Annual physical exam 10/08/2016   Neuropathy 10/08/2016   Contracture of ankle and foot joint 08/07/2014   Dysphagia 08/07/2014   Esophageal reflux 08/07/2014   Erectile dysfunction 08/07/2014   Obstructive sleep apnea 08/07/2014   Osteoarthritis 08/07/2014   Rheumatoid arthritis (HCC) 08/07/2014   Sensorineural hearing loss 08/07/2014   Tinnitus 08/07/2014   LVH (left ventricular hypertrophy) 12/25/2012   Diverticulosis 09/01/2011   White coat hypertension 07/07/2011   Hypothyroidism 06/09/2011   Postablative hypothyroidism 06/09/2011   Psoriatic arthritis (HCC)  06/09/2011   Benign prostatic hyperplasia 06/09/2011    PCP: Wanda Plump, MD   REFERRING PROVIDER: Lenda Kelp, MD   REFERRING DIAG: [phone] (ICD-10-CM) - Lumbar strain, initial encounter   THERAPY DIAG:  Other low back pain  Muscle spasm of back  Muscle weakness (generalized)  Abnormal posture  Difficulty in walking, not elsewhere classified  Unsteadiness on feet  RATIONALE FOR EVALUATION AND TREATMENT:  Rehabilitation  ONSET DATE: ~2 months  NEXT MD VISIT: None scheduled   SUBJECTIVE:                                                                                                                                                                                                         SUBJECTIVE STATEMENT: Had some pain when I got out of bed this morning. I'm experiencing some vertigo today.  EVAL: Pt reports severe back pain started 2 months ago with a "knot" in his R flank. Pain has subsided somewhat but still feels like the knot is there. Pain limits walking tolerance - used to walk loop at [facility] but only able to walk across bridge currently. Pt also notes he feels more wobbly, worsening recently potentially secondary to decreased activity as a result of the back pain. Participates in the "PREP" program at the [facility] - education on diet and exercise.  PAIN: Are you having pain? Yes: NPRS scale: 0/10 currently, up to 3-4/10 Pain location: R>L mid/low back Pain description: "knot", sore Aggravating factors: back extension machine at gym Relieving factors: laying flat on his back   PERTINENT HISTORY:  Atrial fibrillation ablation 12/24/22; SSS s/p pacemaker 12/13/19; B knee scope; L foot surgery; umbilical and B inguinal hernia repairs; HTN; osteoporosis; OA; psoriatic arthritis; RA; cervical vertebral fracture; thyroid disease - hypothyroidism; h/o Graves' disease; CKD; GERD; neuropathy; hearing loss; acute vertigo as of early June   PRECAUTIONS: None  WEIGHT BEARING RESTRICTIONS: No  FALLS:  Has patient fallen in last 6 months? No  LIVING ENVIRONMENT: Lives with: lives with their spouse Lives in: House/apartment Stairs: Yes: Internal: 8 or 7 steps; on right going up and on left going up and External: 1 steps; none (split level home) Has following equipment at home: Quad cane small base and Grab bars  OCCUPATION: Retired  PLOF: Independent and Leisure: walking with wife at  [facility], participates in [place] "PREP" Program at the [employer]    PATIENT GOALS: "Walk steadily or stand for 1+ hours w/o pain."   OBJECTIVE: (objective measures completed at initial evaluation unless otherwise  dated)  DIAGNOSTIC FINDINGS:  No current imaging available.  04/25/20 - Lumbar spine x-ray: FINDINGS: No fracture or spondylolisthesis is noted. Mild degenerative disc disease is noted at L3-4 with anterior osteophyte formation. Atherosclerosis of abdominal aorta is noted.  IMPRESSION: Mild degenerative disc disease is noted at L3-4. No acute abnormality seen in the lumbar spine.  04/25/20 - Thoracic spine x-ray: FINDINGS: No fracture or spondylolisthesis is noted. Mild S-shaped scoliosis of thoracic spine is noted. Osteophyte formation is noted at multiple levels in the middle and lower thoracic spine.   IMPRESSION: No acute abnormality seen in the thoracic spine. Multilevel degenerative disc disease is noted.  PATIENT SURVEYS:  Modified Oswestry 17 / 50 = 34.0 %  FOTO Lumbar = 47, predicted = 58  SCREENING FOR RED FLAGS: Bowel or bladder incontinence: No Spinal tumors: No Cauda equina syndrome: No Compression fracture: No Abdominal aneurysm: No  COGNITION:  Overall cognitive status: Within functional limits for tasks assessed and mild memory/word finding deficits     SENSATION: LE peripheral neuropathy  MUSCLE LENGTH: Hamstrings: mod tight B ITB: mild/mod tight B Piriformis: mod tight R, mild/mod tight L Hip flexors: mod/severe tight B Quads: mod tight B Heelcord: NT  POSTURE:  weight shift left  PALPATION: Increased muscle tension with R > L tenderness to palpation in QL as well as lateral glutes and piriformis  LUMBAR ROM:   Active  eval  Flexion Hands to mid shins - tight HS  Extension 40% limited - ^ R back pain  Right lateral flexion WFL  Left lateral flexion WFL - ^ R back pain  Right rotation 20% limited - ^ R back pain   Left rotation WFL  (Blank rows = not tested)  LOWER EXTREMITY ROM:    B LE grossly WFL other than mild limitations in hip rotation  LOWER EXTREMITY MMT:    MMT  Right eval Left eval  Hip flexion 4+ 4+  Hip extension 4+ 4  Hip abduction 4- 4-  Hip adduction 4 3+  Hip internal rotation 4+ 4+  Hip external rotation 4- 4-  Knee flexion 5 5  Knee extension 5 5  Ankle dorsiflexion 4- 4-  Ankle plantarflexion 4- 3+  Ankle inversion    Ankle eversion    (Blank rows = not tested)  LUMBAR SPECIAL TESTS:  Straight leg raise test: Negative and Slump test: Negative  FUNCTIONAL TESTS: 06/14/23 5 times sit to stand: 15 sec Timed up and go (TUG): 10.20 sec 10 meter walk test: 9.6 sec Functional gait assessment: 21/30 medium risk for falls  GAIT: Distance walked: 80 ft Assistive device utilized: None Level of assistance: Complete Independence Gait pattern: lateral lean- Left Comments:    TODAY'S TREATMENT:   06/14/23 Therapeutic Exercise: to improve flexibility, strength and mobility.  Verbal and tactile cues throughout for technique Nustep L 3 x 6 min Supine piriformis and hip flexor stretches x 30 sec ea B Child's pose seated with swiss ball and L and R x 5 reps ea  Therapeutic Activiites: (see objective for results) 5XSTS TUG 10 MWT FGA  06/02/23 Eval only   PATIENT EDUCATION:  Education details: initial HEP Person educated: Patient Education method: Programmer, multimedia, Facilities manager, and Handouts Education comprehension: verbalized understanding and returned demonstration  HOME EXERCISE PROGRAM: Access Code: [email or web address] URL: [email or web address] Date: 06/14/2023 Prepared by: Raynelle Fanning  Exercises - Supine Piriformis Stretch with Leg Straight  - 2 x daily - 7 x weekly - 1 sets - 3 reps - 30 sec hold -  Modified Thomas Stretch  - 2 x daily - 7 x weekly - 1 sets - 3 reps - 30 sec hold - Seated Flexion Stretch with Swiss Ball  - 1 x daily - 7 x weekly - 1 sets -  10 reps - Seated Thoracic Flexion and Rotation with Swiss Ball  - 1 x daily - 7 x weekly - 1 sets - 10 reps   ASSESSMENT:  CLINICAL IMPRESSION: Further balance assessments completed today. Dave's FGA of 21/30 indicates he is at medium risk for falls. His greatest difficulty was with tandem walking. Descending stairs is affected by both weak quads and vertigo. Initiated HEP for flexibility deficits. Theodoro Grist continues to demonstrate potential for improvement and would benefit from continued skilled therapy to address impairments.    OBJECTIVE IMPAIRMENTS: decreased activity tolerance, decreased balance, decreased endurance, decreased knowledge of condition, decreased mobility, difficulty walking, decreased ROM, decreased strength, hypomobility, increased fascial restrictions, impaired perceived functional ability, increased muscle spasms, impaired flexibility, improper body mechanics, postural dysfunction, and pain.   ACTIVITY LIMITATIONS: carrying, lifting, bending, sitting, standing, squatting, stairs, bed mobility, locomotion level, and caring for others  PARTICIPATION LIMITATIONS: cleaning, community activity, and yard work  PERSONAL FACTORS: Past/current experiences, Time since onset of injury/illness/exacerbation, and 3+ comorbidities: Atrial fibrillation ablation 12/24/22; SSS s/p pacemaker 12/13/19; B knee scope; L foot surgery; umbilical and B inguinal hernia repairs; HTN; osteoporosis; OA; psoriatic arthritis; RA; cervical vertebral fracture; thyroid disease - hypothyroidism; h/o Graves' disease; CKD; GERD; neuropathy; hearing loss; acute vertigo as of early June   are also affecting patient's functional outcome.   REHAB POTENTIAL: Good  CLINICAL DECISION MAKING: Stable/uncomplicated  EVALUATION COMPLEXITY: Low   GOALS: Goals reviewed with patient? Yes  SHORT TERM GOALS: Target date: 06/23/2023  Patient will be independent with initial HEP to improve outcomes and carryover.  Baseline:   Goal status: INITIAL  LONG TERM GOALS: Target date: 07/14/2023  Patient will be independent with ongoing/advanced HEP for self-management at home.  Baseline:  Goal status: INITIAL  2.  Patient will report 50-75% improvement in low back pain to improve QOL.  Baseline: back pain recently up to 3-4/10 Goal status: INITIAL  3.  Patient to demonstrate ability to achieve and maintain good spinal alignment/posturing and body mechanics needed for daily activities. Baseline:  Goal status: INITIAL  4.  Patient will demonstrate functional pain free lumbar ROM to perform ADLs.   Baseline:  Goal status: INITIAL  5.  Patient will demonstrate improved B LE strength to grossly >/= 4+/5 for improved stability and ease of mobility . Baseline: Refer to above MMT table Goal status: INITIAL  6.  Patient will report 38 on lumbar FOTO to demonstrate improved functional ability.  Baseline: 47 Goal status: INITIAL   7. Patient will report </= 22% on Modified Oswestry to demonstrate improved functional ability with decreased pain interference. Baseline: 17 / 50 = 34.0 % Goal status: INITIAL  8.  Patient will tolerate >/= 1 hour of standing or walking w/o increased pain to allow for  improved mobility and activity tolerance. Baseline:  Goal status: INITIAL    PLAN:  PT FREQUENCY: 2x/week  PT DURATION: 6 weeks  PLANNED INTERVENTIONS: PatientTherapeutic exercises, Therapeutic activity, Neuromuscular re-education, Balance training, Gait training, Patient/Family education, Self Care, Joint mobilization, Aquatic Therapy, Dry Needling, Spinal manipulation, Spinal mobilization, Cryotherapy, Moist heat, Taping, Traction, Ultrasound, Manual therapy, and Re-evaluation  PLAN FOR NEXT SESSION: create initial HEP for lumbopelvic flexibility and strengthening; MT +/- DN as indicated to address  abnormal muscle tension   Solon Palm, PT 06/14/2023, 12:07 PM [facility] [address] [place], [place] [address] [phone]  Fax: [phone]

## 2023-06-14 ENCOUNTER — Encounter: Payer: Self-pay | Admitting: Physical Therapy

## 2023-06-14 ENCOUNTER — Ambulatory Visit: Payer: Medicare Other | Attending: Family Medicine | Admitting: Physical Therapy

## 2023-06-14 DIAGNOSIS — M62830 Muscle spasm of back: Secondary | ICD-10-CM | POA: Insufficient documentation

## 2023-06-14 DIAGNOSIS — R42 Dizziness and giddiness: Secondary | ICD-10-CM | POA: Diagnosis not present

## 2023-06-14 DIAGNOSIS — M6281 Muscle weakness (generalized): Secondary | ICD-10-CM | POA: Diagnosis not present

## 2023-06-14 DIAGNOSIS — R2681 Unsteadiness on feet: Secondary | ICD-10-CM | POA: Diagnosis not present

## 2023-06-14 DIAGNOSIS — R293 Abnormal posture: Secondary | ICD-10-CM | POA: Diagnosis not present

## 2023-06-14 DIAGNOSIS — M5459 Other low back pain: Secondary | ICD-10-CM | POA: Insufficient documentation

## 2023-06-14 DIAGNOSIS — R262 Difficulty in walking, not elsewhere classified: Secondary | ICD-10-CM | POA: Insufficient documentation

## 2023-06-14 NOTE — Telephone Encounter (Signed)
Refill request

## 2023-06-16 ENCOUNTER — Ambulatory Visit: Payer: Medicare Other | Admitting: Physical Therapy

## 2023-06-16 ENCOUNTER — Encounter: Payer: Self-pay | Admitting: Physical Therapy

## 2023-06-16 DIAGNOSIS — R293 Abnormal posture: Secondary | ICD-10-CM

## 2023-06-16 DIAGNOSIS — M5459 Other low back pain: Secondary | ICD-10-CM

## 2023-06-16 DIAGNOSIS — R2681 Unsteadiness on feet: Secondary | ICD-10-CM | POA: Diagnosis not present

## 2023-06-16 DIAGNOSIS — R262 Difficulty in walking, not elsewhere classified: Secondary | ICD-10-CM

## 2023-06-16 DIAGNOSIS — M62830 Muscle spasm of back: Secondary | ICD-10-CM

## 2023-06-16 DIAGNOSIS — M6281 Muscle weakness (generalized): Secondary | ICD-10-CM

## 2023-06-16 NOTE — Therapy (Signed)
OUTPATIENT PHYSICAL THERAPY THORACOLUMBAR TREATMENT   Patient Name: Martin Rubio MRN: [id number] DOB:1946-08-09, 77 y.o., male Today's Date: 06/16/2023  END OF SESSION:  PT End of Session - 06/16/23 1019     Visit Number 3    Date for PT Re-Evaluation 07/14/23    Authorization Type Medicare & [employer]    PT Start Time 1019    PT Stop Time 1059    PT Time Calculation (min) 40 min    Activity Tolerance Patient tolerated treatment well    Behavior During Therapy [facility] for tasks assessed/performed             Past Medical History:  Diagnosis Date   Bronchitis    Diverticulosis    FH: BPH (benign prostatic hypertrophy)    History of Graves' disease 01/19/2018   S/p ablation   HTN (hypertension)    Hyperlipidemia    Obesity    Psoriatic arthritis (HCC)    Rheumatoid arthritis (HCC) 08/07/2014   Sleep apnea    CPAP   Thyroid disease    Past Surgical History:  Procedure Laterality Date   ABDOMINAL AORTOGRAM W/LOWER EXTREMITY N/A 01/14/2022   Procedure: ABDOMINAL AORTOGRAM W/LOWER EXTREMITY;  Surgeon: Iran Ouch, MD;  Location: [facility] IN[facility]AS[facility];  Service: Cardiovascular;  Laterality: N/A;   ATRIAL FIBRILLATION ABLATION N/A 12/24/2022   Procedure: ATRIAL FIBRILLATION ABLATION;  Surgeon: Maurice Small, MD;  Location: [facility] INVASIVE CV [facility];  Service: Cardiovascular;  Laterality: N/A;   INGUINAL HERNIA REPAIR     bilateral   KNEE SURGERY     bilateral arthroscopic   l foot surgery Left 12/2020   PACEMAKER IMPLANT N/A 12/13/2019   Procedure: PACEMAKER IMPLANT;  Surgeon: Marinus Maw, MD;  Location: [facility];  Service: Cardiovascular;  Laterality: N/A;   TRANSURETHRAL RESECTION OF PROSTATE     UMBILICAL HERNIA REPAIR     Patient Active Problem List   Diagnosis Date Noted   Benign paroxysmal positional vertigo 06/11/2023   Dark stools 06/11/2023   Strain of lumbar region 04/20/2023   Dyspepsia 02/25/2023   Stage 3b chronic kidney disease (HCC)  07/15/2022   Aortic atherosclerosis (HCC) 03/18/2021   Bruit 02/09/2021   SOB (shortness of breath) 02/09/2021   Aortic valve sclerosis 02/09/2021   Hypercoagulable state due to paroxysmal atrial fibrillation (HCC) 03/05/2020   PCP notes >>>>>>>>>>>>>>> 01/10/2020   Age related osteoporosis 01/10/2020   CKD (chronic kidney disease), stage IV (HCC) 12/16/2019   Pacemaker 12/16/2019   Sick sinus syndrome (HCC) 12/16/2019   Paroxysmal atrial fibrillation (HCC) 10/26/2019   Nodule of flexor tendon sheath 04/13/2019   Hemorrhoids 06/24/2018   Low bone mass 01/19/2018   Iliac aneurysm (HCC) 06/16/2017   Hypertrophic cardiomyopathy (HCC) 06/16/2017   Essential hypertension 12/16/2016   Hyperlipidemia LDL goal <100 12/16/2016   Asthma in adult, mild intermittent, uncomplicated 10/12/2016   Collapsed vertebra, not elsewhere classified, cervical region, initial encounter for fracture (HCC) 10/08/2016   Annual physical exam 10/08/2016   Neuropathy 10/08/2016   Contracture of ankle and foot joint 08/07/2014   Dysphagia 08/07/2014   Esophageal reflux 08/07/2014   Erectile dysfunction 08/07/2014   Obstructive sleep apnea 08/07/2014   Osteoarthritis 08/07/2014   Rheumatoid arthritis (HCC) 08/07/2014   Sensorineural hearing loss 08/07/2014   Tinnitus 08/07/2014   LVH (left ventricular hypertrophy) 12/25/2012   Diverticulosis 09/01/2011   White coat hypertension 07/07/2011   Hypothyroidism 06/09/2011   Postablative hypothyroidism 06/09/2011   Psoriatic arthritis (HCC)  06/09/2011   Benign prostatic hyperplasia 06/09/2011    PCP: Wanda Plump, MD   REFERRING PROVIDER: Lenda Kelp, MD   REFERRING DIAG: [phone] (ICD-10-CM) - Lumbar strain, initial encounter   THERAPY DIAG:  Other low back pain  Muscle spasm of back  Muscle weakness (generalized)  Abnormal posture  Difficulty in walking, not elsewhere classified  Unsteadiness on feet  RATIONALE FOR EVALUATION AND TREATMENT:  Rehabilitation  ONSET DATE: ~2 months  NEXT MD VISIT: None scheduled   SUBJECTIVE:                                                                                                                                                                                                         SUBJECTIVE STATEMENT: My back hurts today.  EVAL: Pt reports severe back pain started 2 months ago with a "knot" in his R flank. Pain has subsided somewhat but still feels like the knot is there. Pain limits walking tolerance - used to walk loop at [facility] but only able to walk across bridge currently. Pt also notes he feels more wobbly, worsening recently potentially secondary to decreased activity as a result of the back pain. Participates in the "PREP" program at the [facility] - education on diet and exercise.  PAIN: Are you having pain? Yes: NPRS scale:  /10 Pain location: R>L mid/low back Pain description: "knot", sore Aggravating factors: back extension machine at gym Relieving factors: laying flat on his back   PERTINENT HISTORY:  Atrial fibrillation ablation 12/24/22; SSS s/p pacemaker 12/13/19; B knee scope; L foot surgery; umbilical and B inguinal hernia repairs; HTN; osteoporosis; OA; psoriatic arthritis; RA; cervical vertebral fracture; thyroid disease - hypothyroidism; h/o Graves' disease; CKD; GERD; neuropathy; hearing loss; acute vertigo as of early June   PRECAUTIONS: None  WEIGHT BEARING RESTRICTIONS: No  FALLS:  Has patient fallen in last 6 months? No  LIVING ENVIRONMENT: Lives with: lives with their spouse Lives in: House/apartment Stairs: Yes: Internal: 8 or 7 steps; on right going up and on left going up and External: 1 steps; none (split level home) Has following equipment at home: Quad cane small base and Grab bars  OCCUPATION: Retired  PLOF: Independent and Leisure: walking with wife at [facility], participates in [place] "PREP" Program at the [employer]    PATIENT  GOALS: "Walk steadily or stand for 1+ hours w/o pain."   OBJECTIVE: (objective measures completed at initial evaluation unless otherwise dated)  DIAGNOSTIC FINDINGS:  No current imaging available.  04/25/20 - Lumbar spine x-ray:  FINDINGS: No fracture or spondylolisthesis is noted. Mild degenerative disc disease is noted at L3-4 with anterior osteophyte formation. Atherosclerosis of abdominal aorta is noted.  IMPRESSION: Mild degenerative disc disease is noted at L3-4. No acute abnormality seen in the lumbar spine.  04/25/20 - Thoracic spine x-ray: FINDINGS: No fracture or spondylolisthesis is noted. Mild S-shaped scoliosis of thoracic spine is noted. Osteophyte formation is noted at multiple levels in the middle and lower thoracic spine.   IMPRESSION: No acute abnormality seen in the thoracic spine. Multilevel degenerative disc disease is noted.  PATIENT SURVEYS:  Modified Oswestry 17 / 50 = 34.0 %  FOTO Lumbar = 47, predicted = 58  SCREENING FOR RED FLAGS: Bowel or bladder incontinence: No Spinal tumors: No Cauda equina syndrome: No Compression fracture: No Abdominal aneurysm: No  COGNITION:  Overall cognitive status: Within functional limits for tasks assessed and mild memory/word finding deficits     SENSATION: LE peripheral neuropathy  MUSCLE LENGTH: Hamstrings: mod tight B ITB: mild/mod tight B Piriformis: mod tight R, mild/mod tight L Hip flexors: mod/severe tight B Quads: mod tight B Heelcord: NT  POSTURE:  weight shift left  PALPATION: Increased muscle tension with R > L tenderness to palpation in QL as well as lateral glutes and piriformis  LUMBAR ROM:   Active  eval  Flexion Hands to mid shins - tight HS  Extension 40% limited - ^ R back pain  Right lateral flexion WFL  Left lateral flexion WFL - ^ R back pain  Right rotation 20% limited - ^ R back pain  Left rotation WFL  (Blank rows = not tested)  LOWER EXTREMITY ROM:    B LE grossly WFL  other than mild limitations in hip rotation  LOWER EXTREMITY MMT:    MMT  Right eval Left eval  Hip flexion 4+ 4+  Hip extension 4+ 4  Hip abduction 4- 4-  Hip adduction 4 3+  Hip internal rotation 4+ 4+  Hip external rotation 4- 4-  Knee flexion 5 5  Knee extension 5 5  Ankle dorsiflexion 4- 4-  Ankle plantarflexion 4- 3+  Ankle inversion    Ankle eversion    (Blank rows = not tested)  LUMBAR SPECIAL TESTS:  Straight leg raise test: Negative and Slump test: Negative  FUNCTIONAL TESTS: 06/14/23 5 times sit to stand: 15 sec Timed up and go (TUG): 10.20 sec 10 meter walk test: 9.6 sec Functional gait assessment: 21/30 medium risk for falls  GAIT: Distance walked: 80 ft Assistive device utilized: None Level of assistance: Complete Independence Gait pattern: lateral lean- Left Comments:    TODAY'S TREATMENT:  06/16/23 Therapeutic Exercise: to improve flexibility, strength and mobility.  Verbal and tactile cues throughout for technique Nustep L 5 x 6 min Standing QL stretch B 2x30 sec Seated low back stretch with swiss ball x 30 ea fwd/L and R Supine piriformis and hip flexor stretches x 30 sec ea B Standing hip ABD, EXT x 20 ea Standing hip ADD cone taps x 20 (needs one UE support when standing on LLE) Standing hip flex cone taps x 20  Heel Raises x 10, unilateral toe raises x 10 ea B gastroc stretch x 30 sec Tandem stance B multiple reps and with head turns  06/14/23 Therapeutic Exercise: to improve flexibility, strength and mobility.  Verbal and tactile cues throughout for technique Nustep L 3 x 6 min Supine piriformis and hip flexor stretches x 30 sec ea B Child's pose seated with  swiss ball and L and R x 5 reps ea  Therapeutic Activiites: (see objective for results) 5XSTS TUG 10 MWT FGA  06/02/23 Eval only   PATIENT EDUCATION:  Education details: initial HEP Person educated: Patient Education method: Programmer, multimedia, Demonstration, and Handouts Education  comprehension: verbalized understanding and returned demonstration  HOME EXERCISE PROGRAM: Access Code: [email or web address] URL: [email or web address] Date: 06/16/2023 Prepared by: Raynelle Fanning  Exercises - Supine Piriformis Stretch with Leg Straight  - 2 x daily - 7 x weekly - 1 sets - 3 reps - 30 sec hold - Modified Thomas Stretch  - 2 x daily - 7 x weekly - 1 sets - 3 reps - 30 sec hold - Seated Flexion Stretch with Swiss Ball  - 1 x daily - 7 x weekly - 1 sets - 10 reps - Seated Thoracic Flexion and Rotation with Swiss Ball  - 1 x daily - 7 x weekly - 1 sets - 10 reps - Tandem Stance in Corner  - 1 x daily - 3-4 x weekly - 1 sets - 10 reps - Tandem Stance with Head Rotation  - 1 x daily - 3-4 x weekly - 1 sets - 10 reps   ASSESSMENT:  CLINICAL IMPRESSION: Theodoro Grist presented with increased R LBP today which resolves with sitting. He did well with TE and can be progressed with theraband on standing hip exercises. He does need some cues to slow down. Tandem balance improves with practice. Head turns are challenging. No complaints of back pain at end of session.   OBJECTIVE IMPAIRMENTS: decreased activity tolerance, decreased balance, decreased endurance, decreased knowledge of condition, decreased mobility, difficulty walking, decreased ROM, decreased strength, hypomobility, increased fascial restrictions, impaired perceived functional ability, increased muscle spasms, impaired flexibility, improper body mechanics, postural dysfunction, and pain.   ACTIVITY LIMITATIONS: carrying, lifting, bending, sitting, standing, squatting, stairs, bed mobility, locomotion level, and caring for others  PARTICIPATION LIMITATIONS: cleaning, community activity, and yard work  PERSONAL FACTORS: Past/current experiences, Time since onset of injury/illness/exacerbation, and 3+ comorbidities: Atrial fibrillation ablation 12/24/22; SSS s/p pacemaker 12/13/19; B knee scope; L foot surgery; umbilical and B inguinal  hernia repairs; HTN; osteoporosis; OA; psoriatic arthritis; RA; cervical vertebral fracture; thyroid disease - hypothyroidism; h/o Graves' disease; CKD; GERD; neuropathy; hearing loss; acute vertigo as of early June   are also affecting patient's functional outcome.   REHAB POTENTIAL: Good  CLINICAL DECISION MAKING: Stable/uncomplicated  EVALUATION COMPLEXITY: Low   GOALS: Goals reviewed with patient? Yes  SHORT TERM GOALS: Target date: 06/23/2023  Patient will be independent with initial HEP to improve outcomes and carryover.  Baseline:  Goal status: INITIAL  LONG TERM GOALS: Target date: 07/14/2023  Patient will be independent with ongoing/advanced HEP for self-management at home.  Baseline:  Goal status: INITIAL  2.  Patient will report 50-75% improvement in low back pain to improve QOL.  Baseline: back pain recently up to 3-4/10 Goal status: INITIAL  3.  Patient to demonstrate ability to achieve and maintain good spinal alignment/posturing and body mechanics needed for daily activities. Baseline:  Goal status: INITIAL  4.  Patient will demonstrate functional pain free lumbar ROM to perform ADLs.   Baseline:  Goal status: INITIAL  5.  Patient will demonstrate improved B LE strength to grossly >/= 4+/5 for improved stability and ease of mobility . Baseline: Refer to above MMT table Goal status: INITIAL  6.  Patient will report 44 on lumbar FOTO to demonstrate improved functional ability.  Baseline: 47 Goal status: INITIAL  7. Patient will report </= 22% on Modified Oswestry to demonstrate improved functional ability with decreased pain interference. Baseline: 17 / 50 = 34.0 % Goal status: INITIAL  8.  Patient will tolerate >/= 1 hour of standing or walking w/o increased pain to allow for  improved mobility and activity tolerance. Baseline:  Goal status: INITIAL    PLAN:  PT FREQUENCY: 2x/week  PT DURATION: 6 weeks  PLANNED INTERVENTIONS: PatientTherapeutic  exercises, Therapeutic activity, Neuromuscular re-education, Balance training, Gait training, Patient/Family education, Self Care, Joint mobilization, Aquatic Therapy, Dry Needling, Spinal manipulation, Spinal mobilization, Cryotherapy, Moist heat, Taping, Traction, Ultrasound, Manual therapy, and Re-evaluation  PLAN FOR NEXT SESSION: create initial HEP for lumbopelvic flexibility and strengthening; MT +/- DN as indicated to address abnormal muscle tension   Solon Palm, PT 06/16/2023, 11:00 AM [facility] [address] [place], [place] [address] [phone]  Fax: [phone]

## 2023-06-21 ENCOUNTER — Ambulatory Visit: Payer: Medicare Other | Attending: Family Medicine | Admitting: Physical Therapy

## 2023-06-21 ENCOUNTER — Encounter: Payer: Self-pay | Admitting: Physical Therapy

## 2023-06-21 DIAGNOSIS — R293 Abnormal posture: Secondary | ICD-10-CM | POA: Diagnosis not present

## 2023-06-21 DIAGNOSIS — H8110 Benign paroxysmal vertigo, unspecified ear: Secondary | ICD-10-CM | POA: Insufficient documentation

## 2023-06-21 DIAGNOSIS — R42 Dizziness and giddiness: Secondary | ICD-10-CM | POA: Insufficient documentation
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
## 2023-06-21 NOTE — Progress Notes (Addendum)
Pt here for Blood pressure check per Macario Carls: pt was seen by Sandford Craze on 06/11/23 and his bp was 150/68. No meds were changed at that apt with Melissa- he told her that his Bps at home were fine.   Pt currently takes: Amlodipine 5 mg Losrtan 50 BID  Pt compliance with medication. Pt says he has only been taking the Losartan as needed.   BP today @ = 123/70 L arm HR = 60  Bp with pts automatic cuff: 121/75   Pt advised per Dr Drue Novel: Continue current medication regimen. Keep pending office visit 09/08/2023 for 4 month follow up.   Called pt on 06/25/23- advised the pt to take Losartan daily as opposed to PRN per Dr Drue Novel. Pt aware and voices understanding.

## 2023-06-21 NOTE — Progress Notes (Signed)
[facility] PRE[facility] Weekly Session  Patient Details  Name: Martin Rubio MRN: [id number] Date of Birth: 01-08-1946 Age: 77 y.o. PCP: Wanda Plump, MD  Vitals:   06/21/23 1600  Weight: 214 lb 9.6 oz (97.3 kg)     [facility] Weekly seesion - 06/21/23 1600       [facility] "PREP" Location   [facility]PRE[facility] Location [facility]      Weekly Session   Topic Discussed Other   Final assessment visit scheduled for Wednesday; fit testing completed; how fit and strong survey completed   Minutes exercised this week 45 minutes    Classes attended to date 41             Neshia Mckenzie B Jerae Izard 06/21/2023, 4:01 PM

## 2023-06-22 ENCOUNTER — Ambulatory Visit (INDEPENDENT_AMBULATORY_CARE_PROVIDER_SITE_OTHER): Payer: Medicare Other

## 2023-06-22 DIAGNOSIS — I10 Essential (primary) hypertension: Secondary | ICD-10-CM

## 2023-06-22 NOTE — Therapy (Signed)
OUTPATIENT PHYSICAL THERAPY THORACOLUMBAR TREATMENT   Patient Name: Martin Rubio MRN: [id number] DOB:1946/02/26, 77 y.o., male Today's Date: 06/23/2023  END OF SESSION:  PT End of Session - 06/23/23 1017     Visit Number 2    Date for PT Re-Evaluation 08/02/23    Authorization Type Medicare & [employer]    PT Start Time 1016    PT Stop Time 1057    PT Time Calculation (min) 41 min    Activity Tolerance Patient tolerated treatment well    Behavior During Therapy [facility] for tasks assessed/performed              Past Medical History:  Diagnosis Date   Bronchitis    Diverticulosis    FH: BPH (benign prostatic hypertrophy)    History of Graves' disease 01/19/2018   S/p ablation   HTN (hypertension)    Hyperlipidemia    Obesity    Psoriatic arthritis (HCC)    Rheumatoid arthritis (HCC) 08/07/2014   Sleep apnea    CPAP   Thyroid disease    Past Surgical History:  Procedure Laterality Date   ABDOMINAL AORTOGRAM W/LOWER EXTREMITY N/A 01/14/2022   Procedure: ABDOMINAL AORTOGRAM W/LOWER EXTREMITY;  Surgeon: Iran Ouch, MD;  [facility];  Service: Cardiovascular;  Laterality: N/A;   ATRIAL FIBRILLATION ABLATION N/A 12/24/2022   Procedure: ATRIAL FIBRILLATION ABLATION;  Surgeon: Maurice Small, MD;  [facility]: [facility];  Service: Cardiovascular;  Laterality: N/A;   INGUINAL HERNIA REPAIR     bilateral   KNEE SURGERY     bilateral arthroscopic   l foot surgery Left 12/2020   PACEMAKER IMPLANT N/A 12/13/2019   Procedure: PACEMAKER IMPLANT;  Surgeon: Marinus Maw, MD;  [facility]: [facility];  Service: Cardiovascular;  Laterality: N/A;   TRANSURETHRAL RESECTION OF PROSTATE     UMBILICAL HERNIA REPAIR     Patient Active Problem List   Diagnosis Date Noted   Benign paroxysmal positional vertigo 06/11/2023   Dark stools 06/11/2023   Strain of lumbar region 04/20/2023   Dyspepsia 02/25/2023   Stage 3b chronic kidney disease (HCC)  07/15/2022   Aortic atherosclerosis (HCC) 03/18/2021   Bruit 02/09/2021   SOB (shortness of breath) 02/09/2021   Aortic valve sclerosis 02/09/2021   Hypercoagulable state due to paroxysmal atrial fibrillation (HCC) 03/05/2020   PCP notes >>>>>>>>>>>>>>> 01/10/2020   Age related osteoporosis 01/10/2020   CKD (chronic kidney disease), stage IV (HCC) 12/16/2019   Pacemaker 12/16/2019   Sick sinus syndrome (HCC) 12/16/2019   Paroxysmal atrial fibrillation (HCC) 10/26/2019   Nodule of flexor tendon sheath 04/13/2019   Hemorrhoids 06/24/2018   Low bone mass 01/19/2018   Iliac aneurysm (HCC) 06/16/2017   Hypertrophic cardiomyopathy (HCC) 06/16/2017   Essential hypertension 12/16/2016   Hyperlipidemia LDL goal <100 12/16/2016   Asthma in adult, mild intermittent, uncomplicated 10/12/2016   Collapsed vertebra, not elsewhere classified, cervical region, initial encounter for fracture (HCC) 10/08/2016   Annual physical exam 10/08/2016   Neuropathy 10/08/2016   Contracture of ankle and foot joint 08/07/2014   Dysphagia 08/07/2014   Esophageal reflux 08/07/2014   Erectile dysfunction 08/07/2014   Obstructive sleep apnea 08/07/2014   Osteoarthritis 08/07/2014   Rheumatoid arthritis (HCC) 08/07/2014   Sensorineural hearing loss 08/07/2014   Tinnitus 08/07/2014   LVH (left ventricular hypertrophy) 12/25/2012   Diverticulosis 09/01/2011   White coat hypertension 07/07/2011   Hypothyroidism 06/09/2011   Postablative hypothyroidism 06/09/2011   Psoriatic arthritis (  HCC) 06/09/2011   Benign prostatic hyperplasia 06/09/2011    PCP: Wanda Plump, MD   REFERRING PROVIDER: Lenda Kelp, MD   REFERRING DIAG: [phone] (ICD-10-CM) - Lumbar strain, initial encounter   THERAPY DIAG:  Other low back pain  Muscle spasm of back  Muscle weakness (generalized)  Abnormal posture  RATIONALE FOR EVALUATION AND TREATMENT: Rehabilitation  ONSET DATE: ~2 months  NEXT MD VISIT: None  scheduled   SUBJECTIVE:                                                                                                                                                                                                         SUBJECTIVE STATEMENT: Patient reports increased pain in low back after leaning over the table last night sorting his pills.   EVAL: Pt reports severe back pain started 2 months ago with a "knot" in his R flank. Pain has subsided somewhat but still feels like the knot is there. Pain limits walking tolerance - used to walk loop at [facility] but only able to walk across bridge currently. Pt also notes he feels more wobbly, worsening recently potentially secondary to decreased activity as a result of the back pain. Participates in the "PREP" program at the [facility] - education on diet and exercise.  PAIN: Are you having pain? Yes: NPRS scale: 2/10 Pain location: R>L mid/low back Pain description: "knot", sore Aggravating factors: back extension machine at gym Relieving factors: laying flat on his back   PERTINENT HISTORY:  Atrial fibrillation ablation 12/24/22; SSS s/p pacemaker 12/13/19; B knee scope; L foot surgery; umbilical and B inguinal hernia repairs; HTN; osteoporosis; OA; psoriatic arthritis; RA; cervical vertebral fracture; thyroid disease - hypothyroidism; h/o Graves' disease; CKD; GERD; neuropathy; hearing loss; acute vertigo as of early June   PRECAUTIONS: None  WEIGHT BEARING RESTRICTIONS: No  FALLS:  Has patient fallen in last 6 months? No  LIVING ENVIRONMENT: Lives with: lives with their spouse Lives in: House/apartment Stairs: Yes: Internal: 8 or 7 steps; on right going up and on left going up and External: 1 steps; none (split level home) Has following equipment at home: Quad cane small base and Grab bars  OCCUPATION: Retired  PLOF: Independent and Leisure: walking with wife at [facility], participates in [place] "PREP" Program at the [employer]    PATIENT GOALS: "Walk steadily or stand for 1+ hours w/o pain."   OBJECTIVE: (objective measures completed at initial evaluation unless otherwise dated)  DIAGNOSTIC FINDINGS:  No current imaging available.  04/25/20 -  Lumbar spine x-ray: FINDINGS: No fracture or spondylolisthesis is noted. Mild degenerative disc disease is noted at L3-4 with anterior osteophyte formation. Atherosclerosis of abdominal aorta is noted.  IMPRESSION: Mild degenerative disc disease is noted at L3-4. No acute abnormality seen in the lumbar spine.  04/25/20 - Thoracic spine x-ray: FINDINGS: No fracture or spondylolisthesis is noted. Mild S-shaped scoliosis of thoracic spine is noted. Osteophyte formation is noted at multiple levels in the middle and lower thoracic spine.   IMPRESSION: No acute abnormality seen in the thoracic spine. Multilevel degenerative disc disease is noted.  PATIENT SURVEYS:  Modified Oswestry 17 / 50 = 34.0 %  FOTO Lumbar = 47, predicted = 58  SCREENING FOR RED FLAGS: Bowel or bladder incontinence: No Spinal tumors: No Cauda equina syndrome: No Compression fracture: No Abdominal aneurysm: No  COGNITION:  Overall cognitive status: Within functional limits for tasks assessed and mild memory/word finding deficits     SENSATION: LE peripheral neuropathy  MUSCLE LENGTH: Hamstrings: mod tight B ITB: mild/mod tight B Piriformis: mod tight R, mild/mod tight L Hip flexors: mod/severe tight B Quads: mod tight B Heelcord: NT  POSTURE:  weight shift left  PALPATION: Increased muscle tension with R > L tenderness to palpation in QL as well as lateral glutes and piriformis  LUMBAR ROM:   Active  eval  Flexion Hands to mid shins - tight HS  Extension 40% limited - ^ R back pain  Right lateral flexion WFL  Left lateral flexion WFL - ^ R back pain  Right rotation 20% limited - ^ R back pain  Left rotation WFL  (Blank rows = not tested)  LOWER EXTREMITY ROM:     B LE grossly WFL other than mild limitations in hip rotation  LOWER EXTREMITY MMT:    MMT  Right eval Left eval  Hip flexion 4+ 4+  Hip extension 4+ 4  Hip abduction 4- 4-  Hip adduction 4 3+  Hip internal rotation 4+ 4+  Hip external rotation 4- 4-  Knee flexion 5 5  Knee extension 5 5  Ankle dorsiflexion 4- 4-  Ankle plantarflexion 4- 3+  Ankle inversion    Ankle eversion    (Blank rows = not tested)  LUMBAR SPECIAL TESTS:  Straight leg raise test: Negative and Slump test: Negative  FUNCTIONAL TESTS: 06/14/23 5 times sit to stand: 15 sec Timed up and go (TUG): 10.20 sec 10 meter walk test: 9.6 sec Functional gait assessment: 21/30 medium risk for falls  GAIT: Distance walked: 80 ft Assistive device utilized: None Level of assistance: Complete Independence Gait pattern: lateral lean- Left Comments:    TODAY'S TREATMENT:  06/23/23 Therapeutic Exercise: to improve flexibility, strength and mobility.  Verbal and tactile cues throughout for technique Nustep L 5 x 6 min Standing QL stretch B 2x30 sec Hip hinge at wall x 10 Sit to stand from mat + foam 2x3 - bothers knees Seated low back stretch with swiss ball 5 x 10 ea fwd/L and R Prone pelvic press 5 sec hold x 5 Prone hip ext with press x 10 ea alternating Supine piriformis stretch x 30 sec B Supine active HS stretch x 10 B for cramping Supine TA contraction + march x 5 ea Supine squential march with TA x 5 ea   06/16/23 Therapeutic Exercise: to improve flexibility, strength and mobility.  Verbal and tactile cues throughout for technique Nustep L 5 x 6 min Standing QL stretch B 2x30 sec Seated low back stretch  with swiss ball x 30 ea fwd/L and R Supine piriformis and hip flexor stretches x 30 sec ea B Standing hip ABD, EXT x 20 ea Standing hip ADD cone taps x 20 (needs one UE support when standing on LLE) Standing hip flex cone taps x 20  Heel Raises x 10, unilateral toe raises x 10 ea B gastroc stretch x  30 sec Tandem stance B multiple reps and with head turns  06/14/23 Therapeutic Exercise: to improve flexibility, strength and mobility.  Verbal and tactile cues throughout for technique Nustep L 3 x 6 min Supine piriformis and hip flexor stretches x 30 sec ea B Child's pose seated with swiss ball and L and R x 5 reps ea  Therapeutic Activiites: (see objective for results) 5XSTS TUG 10 MWT FGA  06/02/23 Eval only   PATIENT EDUCATION:  Education details: initial HEP Person educated: Patient Education method: Programmer, multimedia, Facilities manager, and Handouts Education comprehension: verbalized understanding and returned demonstration  HOME EXERCISE PROGRAM: Access Code: [email or web address] URL: [email or web address] Date: 06/23/2023 Prepared by: Raynelle Fanning  Exercises - Supine Piriformis Stretch with Leg Straight  - 2 x daily - 7 x weekly - 1 sets - 3 reps - 30 sec hold - Modified Thomas Stretch  - 2 x daily - 7 x weekly - 1 sets - 3 reps - 30 sec hold - Seated Flexion Stretch with Swiss Ball  - 1 x daily - 7 x weekly - 1 sets - 10 reps - Seated Thoracic Flexion and Rotation with Swiss Ball  - 1 x daily - 7 x weekly - 1 sets - 10 reps - Tandem Stance in Corner  - 1 x daily - 3-4 x weekly - 1 sets - 10 reps - Tandem Stance with Head Rotation  - 1 x daily - 3-4 x weekly - 1 sets - 10 reps - Prone Hip Extension with Pillow Under Abdomen  - 1 x daily - 3-4 x weekly - 2 sets - 10 reps - Hooklying Sequential Leg March and Lower  - 1 x daily - 3-4 x weekly - 1 sets - 10 reps   ASSESSMENT:  CLINICAL IMPRESSION: Theodoro Grist was able to tolerate initial stabilization exercise without increased pain. PT reviewed hinge technique for standing over a table or at the sink and also recommended he raise his work surface or sit down if it prolonged work. He would benefit from ongoing education using modified deadlifts to strengthen his back in standing.    OBJECTIVE IMPAIRMENTS: decreased activity tolerance,  decreased balance, decreased endurance, decreased knowledge of condition, decreased mobility, difficulty walking, decreased ROM, decreased strength, hypomobility, increased fascial restrictions, impaired perceived functional ability, increased muscle spasms, impaired flexibility, improper body mechanics, postural dysfunction, and pain.   ACTIVITY LIMITATIONS: carrying, lifting, bending, sitting, standing, squatting, stairs, bed mobility, locomotion level, and caring for others  PARTICIPATION LIMITATIONS: cleaning, community activity, and yard work  PERSONAL FACTORS: Past/current experiences, Time since onset of injury/illness/exacerbation, and 3+ comorbidities: Atrial fibrillation ablation 12/24/22; SSS s/p pacemaker 12/13/19; B knee scope; L foot surgery; umbilical and B inguinal hernia repairs; HTN; osteoporosis; OA; psoriatic arthritis; RA; cervical vertebral fracture; thyroid disease - hypothyroidism; h/o Graves' disease; CKD; GERD; neuropathy; hearing loss; acute vertigo as of early June   are also affecting patient's functional outcome.   REHAB POTENTIAL: Good  CLINICAL DECISION MAKING: Stable/uncomplicated  EVALUATION COMPLEXITY: Low   GOALS: Goals reviewed with patient? Yes  SHORT TERM GOALS: Target date: 06/23/2023  Patient will be independent with initial  HEP to improve outcomes and carryover.  Baseline:  Goal status: INITIAL  LONG TERM GOALS: Target date: 07/14/2023  Patient will be independent with ongoing/advanced HEP for self-management at home.  Baseline:  Goal status: INITIAL  2.  Patient will report 50-75% improvement in low back pain to improve QOL.  Baseline: back pain recently up to 3-4/10 Goal status: INITIAL  3.  Patient to demonstrate ability to achieve and maintain good spinal alignment/posturing and body mechanics needed for daily activities. Baseline:  Goal status: INITIAL  4.  Patient will demonstrate functional pain free lumbar ROM to perform ADLs.    Baseline:  Goal status: INITIAL  5.  Patient will demonstrate improved B LE strength to grossly >/= 4+/5 for improved stability and ease of mobility . Baseline: Refer to above MMT table Goal status: INITIAL  6.  Patient will report 107 on lumbar FOTO to demonstrate improved functional ability.  Baseline: 47 Goal status: INITIAL   7. Patient will report </= 22% on Modified Oswestry to demonstrate improved functional ability with decreased pain interference. Baseline: 17 / 50 = 34.0 % Goal status: INITIAL  8.  Patient will tolerate >/= 1 hour of standing or walking w/o increased pain to allow for  improved mobility and activity tolerance. Baseline:  Goal status: INITIAL    PLAN:  PT FREQUENCY: 2x/week  PT DURATION: 6 weeks  PLANNED INTERVENTIONS: PatientTherapeutic exercises, Therapeutic activity, Neuromuscular re-education, Balance training, Gait training, Patient/Family education, Self Care, Joint mobilization, Aquatic Therapy, Dry Needling, Spinal manipulation, Spinal mobilization, Cryotherapy, Moist heat, Taping, Traction, Ultrasound, Manual therapy, and Re-evaluation  PLAN FOR NEXT SESSION: create initial HEP for lumbopelvic flexibility and strengthening; MT +/- DN as indicated to address abnormal muscle tension   Solon Palm, PT 06/23/2023, 11:59 AM [facility] [address] [place], [place] [address] [phone]  Fax: [phone]

## 2023-06-22 NOTE — Progress Notes (Addendum)
BP looks good, no change but recommend to take losartan daily note as needed. JP

## 2023-06-23 ENCOUNTER — Encounter: Payer: Self-pay | Admitting: Physical Therapy

## 2023-06-23 ENCOUNTER — Ambulatory Visit: Payer: Medicare Other | Admitting: Physical Therapy

## 2023-06-23 DIAGNOSIS — R262 Difficulty in walking, not elsewhere classified: Secondary | ICD-10-CM | POA: Diagnosis not present

## 2023-06-23 DIAGNOSIS — M62830 Muscle spasm of back: Secondary | ICD-10-CM | POA: Diagnosis not present

## 2023-06-23 DIAGNOSIS — M5459 Other low back pain: Secondary | ICD-10-CM

## 2023-06-23 DIAGNOSIS — R2681 Unsteadiness on feet: Secondary | ICD-10-CM | POA: Diagnosis not present

## 2023-06-23 DIAGNOSIS — M6281 Muscle weakness (generalized): Secondary | ICD-10-CM | POA: Diagnosis not present

## 2023-06-23 DIAGNOSIS — R293 Abnormal posture: Secondary | ICD-10-CM

## 2023-06-23 NOTE — Progress Notes (Signed)
[facility] PREP Evaluation  Patient Details  Name: Martin Rubio MRN: [id number] Date of Birth: 04-May-1946 Age: 77 y.o. PCP: Wanda Plump, MD  Vitals:   06/23/23 1439  BP: 126/72  Pulse: 60  SpO2: 99%  Weight: 214 lb 9.6 oz (97.3 kg)     [facility] Eval - 06/23/23 1400       [facility] "PREP" Location   [facility] "PREP" Location [facility]      Referral    Program Start Date 04/05/23    Program End Date 06/23/23      Measurement   Waist Circumference 47 inches    Waist Circumference End Program 45.5 inches    Hip Circumference 41.5 inches    Hip Circumference End Program 41.5 inches    Body fat 35.5 percent      Mobility and Daily Activities   I find it easy to walk up or down two or more flights of stairs. 3    I have no trouble taking out the trash. 2    I do housework such as vacuuming and dusting on my own without difficulty. 2    I can easily lift a gallon of milk (8lbs). 4    I can easily walk a mile. 1    I have no trouble reaching into high cupboards or reaching down to pick up something from the floor. 1    I do not have trouble doing out-door work such as Loss adjuster, chartered, raking leaves, or gardening. 2      Mobility and Daily Activities   I feel younger than my age. 1    I feel independent. 3    I feel energetic. 2    I live an active life.  1    I feel strong. 2    I feel healthy. 2    I feel active as other people my age. 3      How fit and strong are you.   Fit and Strong Total Score 29            Past Medical History:  Diagnosis Date   Bronchitis    Diverticulosis    FH: BPH (benign prostatic hypertrophy)    History of Graves' disease 01/19/2018   S/p ablation   HTN (hypertension)    Hyperlipidemia    Obesity    Psoriatic arthritis (HCC)    Rheumatoid arthritis (HCC) 08/07/2014   Sleep apnea    CPAP   Thyroid disease    Past Surgical History:  Procedure Laterality Date   ABDOMINAL AORTOGRAM W/LOWER EXTREMITY N/A 01/14/2022   Procedure:  ABDOMINAL AORTOGRAM W/LOWER EXTREMITY;  Surgeon: Iran Ouch, MD;  [facility];  Service: Cardiovascular;  Laterality: N/A;   ATRIAL FIBRILLATION ABLATION N/A 12/24/2022   Procedure: ATRIAL FIBRILLATION ABLATION;  Surgeon: Maurice Small, MD;  [facility]: [facility];  Service: Cardiovascular;  Laterality: N/A;   INGUINAL HERNIA REPAIR     bilateral   KNEE SURGERY     bilateral arthroscopic   l foot surgery Left 12/2020   PACEMAKER IMPLANT N/A 12/13/2019   Procedure: PACEMAKER IMPLANT;  Surgeon: Marinus Maw, MD;  Location: [facility];  Service: Cardiovascular;  Laterality: N/A;   TRANSURETHRAL RESECTION OF PROSTATE     UMBILICAL HERNIA REPAIR     Social History   Tobacco Use  Smoking Status Former   Types: Cigarettes  Smokeless Tobacco Never  Tobacco Comments   Former  smoker Quit 46 years ago. 01/21/23  Inches lost: 1.5 Education sessions completed: 7 Workout sessions completed: 7 Did complete the course, but missed several of the sessions with ongoing back pain and veritgo  Byran Bilotti B Jessicah Croll 06/23/2023, 2:41 PM

## 2023-06-27 NOTE — Therapy (Signed)
OUTPATIENT PHYSICAL THERAPY THORACOLUMBAR TREATMENT   Patient Name: Martin Rubio MRN: [id number] DOB:01-20-1946, 77 y.o., male Today's Date: 06/28/2023  END OF SESSION:  PT End of Session - 06/28/23 1012     Visit Number 3    Date for PT Re-Evaluation 08/02/23    Authorization Type Medicare & [employer]    PT Start Time 1012    PT Stop Time 1055    PT Time Calculation (min) 43 min    Activity Tolerance Patient tolerated treatment well    Behavior During Therapy [facility] for tasks assessed/performed               Past Medical History:  Diagnosis Date   Bronchitis    Diverticulosis    FH: BPH (benign prostatic hypertrophy)    History of Graves' disease 01/19/2018   S/p ablation   HTN (hypertension)    Hyperlipidemia    Obesity    Psoriatic arthritis (HCC)    Rheumatoid arthritis (HCC) 08/07/2014   Sleep apnea    CPAP   Thyroid disease    Past Surgical History:  Procedure Laterality Date   ABDOMINAL AORTOGRAM W/LOWER EXTREMITY N/A 01/14/2022   Procedure: ABDOMINAL AORTOGRAM W/LOWER EXTREMITY;  Surgeon: Iran Ouch, MD;  Location: [facility] INVASIVE [facility];  Service: Cardiovascular;  Laterality: N/A;   ATRIAL FIBRILLATION ABLATION N/A 12/24/2022   Procedure: ATRIAL FIBRILLATION ABLATION;  Surgeon: Maurice Small, MD;  Location: [facility] INVASIVE CV [facility];  Service: Cardiovascular;  Laterality: N/A;   INGUINAL HERNIA REPAIR     bilateral   KNEE SURGERY     bilateral arthroscopic   l foot surgery Left 12/2020   PACEMAKER IMPLANT N/A 12/13/2019   Procedure: PACEMAKER IMPLANT;  Surgeon: Marinus Maw, MD;  Location: [facility] INVAS[facility] CV [facility];  Service: Cardiovascular;  Laterality: N/A;   TRANSURETHRAL RESECTION OF PROSTATE     UMBILICAL HERNIA REPAIR     Patient Active Problem List   Diagnosis Date Noted   Benign paroxysmal positional vertigo 06/11/2023   Dark stools 06/11/2023   Strain of lumbar region 04/20/2023   Dyspepsia 02/25/2023   Stage 3b chronic kidney disease (HCC)  07/15/2022   Aortic atherosclerosis (HCC) 03/18/2021   Bruit 02/09/2021   SOB (shortness of breath) 02/09/2021   Aortic valve sclerosis 02/09/2021   Hypercoagulable state due to paroxysmal atrial fibrillation (HCC) 03/05/2020   PCP notes >>>>>>>>>>>>>>> 01/10/2020   Age related osteoporosis 01/10/2020   CKD (chronic kidney disease), stage IV (HCC) 12/16/2019   Pacemaker 12/16/2019   Sick sinus syndrome (HCC) 12/16/2019   Paroxysmal atrial fibrillation (HCC) 10/26/2019   Nodule of flexor tendon sheath 04/13/2019   Hemorrhoids 06/24/2018   Low bone mass 01/19/2018   Iliac aneurysm (HCC) 06/16/2017   Hypertrophic cardiomyopathy (HCC) 06/16/2017   Essential hypertension 12/16/2016   Hyperlipidemia LDL goal <100 12/16/2016   Asthma in adult, mild intermittent, uncomplicated 10/12/2016   Collapsed vertebra, not elsewhere classified, cervical region, initial encounter for fracture (HCC) 10/08/2016   Annual physical exam 10/08/2016   Neuropathy 10/08/2016   Contracture of ankle and foot joint 08/07/2014   Dysphagia 08/07/2014   Esophageal reflux 08/07/2014   Erectile dysfunction 08/07/2014   Obstructive sleep apnea 08/07/2014   Osteoarthritis 08/07/2014   Rheumatoid arthritis (HCC) 08/07/2014   Sensorineural hearing loss 08/07/2014   Tinnitus 08/07/2014   LVH (left ventricular hypertrophy) 12/25/2012   Diverticulosis 09/01/2011   White coat hypertension 07/07/2011   Hypothyroidism 06/09/2011   Postablative hypothyroidism 06/09/2011   Psoriatic  arthritis (HCC) 06/09/2011   Benign prostatic hyperplasia 06/09/2011    PCP: Wanda Plump, MD   REFERRING PROVIDER: Lenda Kelp, MD   REFERRING DIAG: [phone] (ICD-10-CM) - Lumbar strain, initial encounter   THERAPY DIAG:  Other low back pain  Muscle spasm of back  Muscle weakness (generalized)  Abnormal posture  Dizziness and giddiness  RATIONALE FOR EVALUATION AND TREATMENT: Rehabilitation  ONSET DATE: ~2 months  NEXT  MD VISIT: None scheduled   SUBJECTIVE:                                                                                                                                                                                                         SUBJECTIVE STATEMENT: Able to do stretch without pain now.   EVAL: Pt reports severe back pain started 2 months ago with a "knot" in his R flank. Pain has subsided somewhat but still feels like the knot is there. Pain limits walking tolerance - used to walk loop at [facility] but only able to walk across bridge currently. Pt also notes he feels more wobbly, worsening recently potentially secondary to decreased activity as a result of the back pain. Participates in the "PREP" program at the [facility] - education on diet and exercise.  PAIN: Are you having pain? Yes: NPRS scale: 0/10 Pain location: R>L mid/low back Pain description: "knot", sore Aggravating factors: back extension machine at gym Relieving factors: laying flat on his back   PERTINENT HISTORY:  Atrial fibrillation ablation 12/24/22; SSS s/p pacemaker 12/13/19; B knee scope; L foot surgery; umbilical and B inguinal hernia repairs; HTN; osteoporosis; OA; psoriatic arthritis; RA; cervical vertebral fracture; thyroid disease - hypothyroidism; h/o Graves' disease; CKD; GERD; neuropathy; hearing loss; acute vertigo as of early June   PRECAUTIONS: None  WEIGHT BEARING RESTRICTIONS: No  FALLS:  Has patient fallen in last 6 months? No  LIVING ENVIRONMENT: Lives with: lives with their spouse Lives in: House/apartment Stairs: Yes: Internal: 8 or 7 steps; on right going up and on left going up and External: 1 steps; none (split level home) Has following equipment at home: Quad cane small base and Grab bars  OCCUPATION: Retired  PLOF: Independent and Leisure: walking with wife at [facility], participates in [place] "PREP" Program at the [employer]    PATIENT GOALS: "Walk steadily or stand  for 1+ hours w/o pain."   OBJECTIVE: (objective measures completed at initial evaluation unless otherwise dated)  DIAGNOSTIC FINDINGS:  No current imaging available.  04/25/20 - Lumbar spine x-ray: FINDINGS: No  fracture or spondylolisthesis is noted. Mild degenerative disc disease is noted at L3-4 with anterior osteophyte formation. Atherosclerosis of abdominal aorta is noted.  IMPRESSION: Mild degenerative disc disease is noted at L3-4. No acute abnormality seen in the lumbar spine.  04/25/20 - Thoracic spine x-ray: FINDINGS: No fracture or spondylolisthesis is noted. Mild S-shaped scoliosis of thoracic spine is noted. Osteophyte formation is noted at multiple levels in the middle and lower thoracic spine.   IMPRESSION: No acute abnormality seen in the thoracic spine. Multilevel degenerative disc disease is noted.  PATIENT SURVEYS:  Modified Oswestry 17 / 50 = 34.0 %  FOTO Lumbar = 47, predicted = 58  SCREENING FOR RED FLAGS: Bowel or bladder incontinence: No Spinal tumors: No Cauda equina syndrome: No Compression fracture: No Abdominal aneurysm: No  COGNITION:  Overall cognitive status: Within functional limits for tasks assessed and mild memory/word finding deficits     SENSATION: LE peripheral neuropathy  MUSCLE LENGTH: Hamstrings: mod tight B ITB: mild/mod tight B Piriformis: mod tight R, mild/mod tight L Hip flexors: mod/severe tight B Quads: mod tight B Heelcord: NT  POSTURE:  weight shift left  PALPATION: Increased muscle tension with R > L tenderness to palpation in QL as well as lateral glutes and piriformis  LUMBAR ROM:   Active  eval  Flexion Hands to mid shins - tight HS  Extension 40% limited - ^ R back pain  Right lateral flexion WFL  Left lateral flexion WFL - ^ R back pain  Right rotation 20% limited - ^ R back pain  Left rotation WFL  (Blank rows = not tested)  LOWER EXTREMITY ROM:    B LE grossly WFL other than mild limitations in hip  rotation  LOWER EXTREMITY MMT:    MMT  Right eval Left eval  Hip flexion 4+ 4+  Hip extension 4+ 4  Hip abduction 4- 4-  Hip adduction 4 3+  Hip internal rotation 4+ 4+  Hip external rotation 4- 4-  Knee flexion 5 5  Knee extension 5 5  Ankle dorsiflexion 4- 4-  Ankle plantarflexion 4- 3+  Ankle inversion    Ankle eversion    (Blank rows = not tested)  LUMBAR SPECIAL TESTS:  Straight leg raise test: Negative and Slump test: Negative  FUNCTIONAL TESTS: 06/14/23 5 times sit to stand: 15 sec Timed up and go (TUG): 10.20 sec 10 meter walk test: 9.6 sec Functional gait assessment: 21/30 medium risk for falls  GAIT: Distance walked: 80 ft Assistive device utilized: None Level of assistance: Complete Independence Gait pattern: lateral lean- Left Comments:    TODAY'S TREATMENT:   06/28/23 Therapeutic Exercise: to improve flexibility, strength and mobility.  Verbal and tactile cues throughout for technique 890 ft Standing QL stretch B 2x30 sec Hip hinge at wall x 10 Dead lift 5#  to 8 inch step x 10 Sit to stand from chair + foam 5# weight press outs Sit to stand from chair + foam 5# wt  ea hand with bicep curl to OH press x 10 Lat pull standing 20# x 10 with ab contraction pulling down to waist Seated Rows 55# with ab contraction x 10 Single leg RDL with one hand on raised mat table x 10 B Standing partial lunge with rotations 5# wt x 10 B  06/23/23 Therapeutic Exercise: to improve flexibility, strength and mobility.  Verbal and tactile cues throughout for technique Nustep L 5 x 6 min Standing QL stretch B 2x30 sec Hip  hinge at wall x 10 Sit to stand from mat + foam 2x3 - bothers knees Seated low back stretch with swiss ball 5 x 10 ea fwd/L and R Prone pelvic press 5 sec hold x 5 Prone hip ext with press x 10 ea alternating Supine piriformis stretch x 30 sec B Supine active HS stretch x 10 B for cramping Supine TA contraction + march x 5 ea Supine squential  march with TA x 5 ea   06/16/23 Therapeutic Exercise: to improve flexibility, strength and mobility.  Verbal and tactile cues throughout for technique Nustep L 5 x 6 min Standing QL stretch B 2x30 sec Seated low back stretch with swiss ball x 30 ea fwd/L and R Supine piriformis and hip flexor stretches x 30 sec ea B Standing hip ABD, EXT x 20 ea Standing hip ADD cone taps x 20 (needs one UE support when standing on LLE) Standing hip flex cone taps x 20  Heel Raises x 10, unilateral toe raises x 10 ea B gastroc stretch x 30 sec Tandem stance B multiple reps and with head turns   PATIENT EDUCATION:  Education details: initial HEP Person educated: Patient Education method: Programmer, multimedia, Facilities manager, and Handouts Education comprehension: verbalized understanding and returned demonstration  HOME EXERCISE PROGRAM: Access Code: [email or web address] URL: [email or web address] Date: 06/23/2023 Prepared by: Raynelle Fanning  Exercises - Supine Piriformis Stretch with Leg Straight  - 2 x daily - 7 x weekly - 1 sets - 3 reps - 30 sec hold - Modified Thomas Stretch  - 2 x daily - 7 x weekly - 1 sets - 3 reps - 30 sec hold - Seated Flexion Stretch with Swiss Ball  - 1 x daily - 7 x weekly - 1 sets - 10 reps - Seated Thoracic Flexion and Rotation with Swiss Ball  - 1 x daily - 7 x weekly - 1 sets - 10 reps - Tandem Stance in Corner  - 1 x daily - 3-4 x weekly - 1 sets - 10 reps - Tandem Stance with Head Rotation  - 1 x daily - 3-4 x weekly - 1 sets - 10 reps - Prone Hip Extension with Pillow Under Abdomen  - 1 x daily - 3-4 x weekly - 2 sets - 10 reps - Hooklying Sequential Leg March and Lower  - 1 x daily - 3-4 x weekly - 1 sets - 10 reps   ASSESSMENT:  CLINICAL IMPRESSION: Theodoro Grist did very well with new exercises and would benefit from continued dynamic core strengthening. He is somewhat limited by knee pain. He reports improvement in back pain and flexibility, but still gets significant stretch  with doorway QL stretch. He did well with 6 MWT as warm up walking 890 ft. Theodoro Grist continues to demonstrate potential for improvement and would benefit from continued skilled therapy to address impairments.     OBJECTIVE IMPAIRMENTS: decreased activity tolerance, decreased balance, decreased endurance, decreased knowledge of condition, decreased mobility, difficulty walking, decreased ROM, decreased strength, hypomobility, increased fascial restrictions, impaired perceived functional ability, increased muscle spasms, impaired flexibility, improper body mechanics, postural dysfunction, and pain.   ACTIVITY LIMITATIONS: carrying, lifting, bending, sitting, standing, squatting, stairs, bed mobility, locomotion level, and caring for others  PARTICIPATION LIMITATIONS: cleaning, community activity, and yard work  PERSONAL FACTORS: Past/current experiences, Time since onset of injury/illness/exacerbation, and 3+ comorbidities: Atrial fibrillation ablation 12/24/22; SSS s/p pacemaker 12/13/19; B knee scope; L foot surgery; umbilical and B inguinal hernia repairs; HTN; osteoporosis; OA; psoriatic arthritis; RA; cervical  vertebral fracture; thyroid disease - hypothyroidism; h/o Graves' disease; CKD; GERD; neuropathy; hearing loss; acute vertigo as of early June   are also affecting patient's functional outcome.   REHAB POTENTIAL: Good  CLINICAL DECISION MAKING: Stable/uncomplicated  EVALUATION COMPLEXITY: Low   GOALS: Goals reviewed with patient? Yes  SHORT TERM GOALS: Target date: 06/23/2023  Patient will be independent with initial HEP to improve outcomes and carryover.  Baseline:  Goal status: IN PROGRESS partially compliant 06/28/23  LONG TERM GOALS: Target date: 07/14/2023  Patient will be independent with ongoing/advanced HEP for self-management at home.  Baseline:  Goal status: INITIAL  2.  Patient will report 50-75% improvement in low back pain to improve QOL.  Baseline: back pain recently up  to 3-4/10 Goal status: INITIAL  3.  Patient to demonstrate ability to achieve and maintain good spinal alignment/posturing and body mechanics needed for daily activities. Baseline:  Goal status: INITIAL  4.  Patient will demonstrate functional pain free lumbar ROM to perform ADLs.   Baseline:  Goal status: INITIAL  5.  Patient will demonstrate improved B LE strength to grossly >/= 4+/5 for improved stability and ease of mobility . Baseline: Refer to above MMT table Goal status: INITIAL  6.  Patient will report 52 on lumbar FOTO to demonstrate improved functional ability.  Baseline: 47 Goal status: INITIAL   7. Patient will report </= 22% on Modified Oswestry to demonstrate improved functional ability with decreased pain interference. Baseline: 17 / 50 = 34.0 % Goal status: INITIAL  8.  Patient will tolerate >/= 1 hour of standing or walking w/o increased pain to allow for  improved mobility and activity tolerance. Baseline:  Goal status: INITIAL    PLAN:  PT FREQUENCY: 2x/week  PT DURATION: 6 weeks  PLANNED INTERVENTIONS: PatientTherapeutic exercises, Therapeutic activity, Neuromuscular re-education, Balance training, Gait training, Patient/Family education, Self Care, Joint mobilization, Aquatic Therapy, Dry Needling, Spinal manipulation, Spinal mobilization, Cryotherapy, Moist heat, Taping, Traction, Ultrasound, Manual therapy, and Re-evaluation  PLAN FOR NEXT SESSION: create initial HEP for lumbopelvic flexibility and strengthening; MT +/- DN as indicated to address abnormal muscle tension   Solon Palm, PT 06/28/2023, 11:00 AM [facility]  [phone]  Fax: [phone]

## 2023-06-28 ENCOUNTER — Encounter: Payer: Self-pay | Admitting: Physical Therapy

## 2023-06-28 ENCOUNTER — Ambulatory Visit: Payer: Medicare Other | Admitting: Physical Therapy

## 2023-06-28 DIAGNOSIS — R42 Dizziness and giddiness: Secondary | ICD-10-CM

## 2023-06-28 DIAGNOSIS — M62830 Muscle spasm of back: Secondary | ICD-10-CM | POA: Diagnosis not present

## 2023-06-28 DIAGNOSIS — M5459 Other low back pain: Secondary | ICD-10-CM

## 2023-06-28 DIAGNOSIS — R262 Difficulty in walking, not elsewhere classified: Secondary | ICD-10-CM | POA: Diagnosis not present

## 2023-06-28 DIAGNOSIS — R293 Abnormal posture: Secondary | ICD-10-CM

## 2023-06-28 DIAGNOSIS — M6281 Muscle weakness (generalized): Secondary | ICD-10-CM | POA: Diagnosis not present

## 2023-06-28 DIAGNOSIS — R2681 Unsteadiness on feet: Secondary | ICD-10-CM | POA: Diagnosis not present

## 2023-06-30 ENCOUNTER — Ambulatory Visit: Payer: Medicare Other

## 2023-06-30 DIAGNOSIS — M62830 Muscle spasm of back: Secondary | ICD-10-CM

## 2023-06-30 DIAGNOSIS — M5459 Other low back pain: Secondary | ICD-10-CM | POA: Diagnosis not present

## 2023-06-30 DIAGNOSIS — M6281 Muscle weakness (generalized): Secondary | ICD-10-CM | POA: Diagnosis not present

## 2023-06-30 DIAGNOSIS — R2681 Unsteadiness on feet: Secondary | ICD-10-CM | POA: Diagnosis not present

## 2023-06-30 DIAGNOSIS — R293 Abnormal posture: Secondary | ICD-10-CM

## 2023-06-30 DIAGNOSIS — R262 Difficulty in walking, not elsewhere classified: Secondary | ICD-10-CM | POA: Diagnosis not present

## 2023-06-30 NOTE — Therapy (Signed)
OUTPATIENT PHYSICAL THERAPY THORACOLUMBAR TREATMENT   Patient Name: Martin Rubio MRN: [id number] DOB:04-19-1946, 77 y.o., male Today's Date: 06/30/2023  END OF SESSION:  PT End of Session - 06/30/23 1120     Visit Number 4    Date for PT Re-Evaluation 08/02/23    Authorization Type Medicare & [employer]    PT Start Time 1021    PT Stop Time 1106    PT Time Calculation (min) 45 min    Activity Tolerance Patient tolerated treatment well    Behavior During Therapy [facility] for tasks assessed/performed                Past Medical History:  Diagnosis Date   Bronchitis    Diverticulosis    FH: BPH (benign prostatic hypertrophy)    History of Graves' disease 01/19/2018   S/p ablation   HTN (hypertension)    Hyperlipidemia    Obesity    Psoriatic arthritis (HCC)    Rheumatoid arthritis (HCC) 08/07/2014   Sleep apnea    CPAP   Thyroid disease    Past Surgical History:  Procedure Laterality Date   ABDOMINAL AORTOGRAM W/LOWER EXTREMITY N/A 01/14/2022   Procedure: ABDOMINAL AORTOGRAM W/LOWER EXTREMITY;  Surgeon: Iran Ouch, MD;  Location: [facility];  Service: Cardiovascular;  Laterality: N/A;   ATRIAL FIBRILLATION ABLATION N/A 12/24/2022   Procedure: ATRIAL FIBRILLATION ABLATION;  Surgeon: Maurice Small, MD;  Location: [facility] INVAS[facility] CV [facility];  Service: Cardiovascular;  Laterality: N/A;   INGUINAL HERNIA REPAIR     bilateral   KNEE SURGERY     bilateral arthroscopic   l foot surgery Left 12/2020   PACEMAKER IMPLANT N/A 12/13/2019   Procedure: PACEMAKER IMPLANT;  Surgeon: Marinus Maw, MD;  Location: [facility];  Service: Cardiovascular;  Laterality: N/A;   TRANSURETHRAL RESECTION OF PROSTATE     UMBILICAL HERNIA REPAIR     Patient Active Problem List   Diagnosis Date Noted   Benign paroxysmal positional vertigo 06/11/2023   Dark stools 06/11/2023   Strain of lumbar region 04/20/2023   Dyspepsia 02/25/2023   Stage 3b chronic kidney disease (HCC)  07/15/2022   Aortic atherosclerosis (HCC) 03/18/2021   Bruit 02/09/2021   SOB (shortness of breath) 02/09/2021   Aortic valve sclerosis 02/09/2021   Hypercoagulable state due to paroxysmal atrial fibrillation (HCC) 03/05/2020   PCP notes >>>>>>>>>>>>>>> 01/10/2020   Age related osteoporosis 01/10/2020   CKD (chronic kidney disease), stage IV (HCC) 12/16/2019   Pacemaker 12/16/2019   Sick sinus syndrome (HCC) 12/16/2019   Paroxysmal atrial fibrillation (HCC) 10/26/2019   Nodule of flexor tendon sheath 04/13/2019   Hemorrhoids 06/24/2018   Low bone mass 01/19/2018   Iliac aneurysm (HCC) 06/16/2017   Hypertrophic cardiomyopathy (HCC) 06/16/2017   Essential hypertension 12/16/2016   Hyperlipidemia LDL goal <100 12/16/2016   Asthma in adult, mild intermittent, uncomplicated 10/12/2016   Collapsed vertebra, not elsewhere classified, cervical region, initial encounter for fracture (HCC) 10/08/2016   Annual physical exam 10/08/2016   Neuropathy 10/08/2016   Contracture of ankle and foot joint 08/07/2014   Dysphagia 08/07/2014   Esophageal reflux 08/07/2014   Erectile dysfunction 08/07/2014   Obstructive sleep apnea 08/07/2014   Osteoarthritis 08/07/2014   Rheumatoid arthritis (HCC) 08/07/2014   Sensorineural hearing loss 08/07/2014   Tinnitus 08/07/2014   LVH (left ventricular hypertrophy) 12/25/2012   Diverticulosis 09/01/2011   White coat hypertension 07/07/2011   Hypothyroidism 06/09/2011   Postablative hypothyroidism 06/09/2011  Psoriatic arthritis (HCC) 06/09/2011   Benign prostatic hyperplasia 06/09/2011    PCP: Wanda Plump, MD   REFERRING PROVIDER: Lenda Kelp, MD   REFERRING DIAG: [phone] (ICD-10-CM) - Lumbar strain, initial encounter   THERAPY DIAG:  Other low back pain  Muscle spasm of back  Muscle weakness (generalized)  Abnormal posture  RATIONALE FOR EVALUATION AND TREATMENT: Rehabilitation  ONSET DATE: ~2 months  NEXT MD VISIT: None  scheduled   SUBJECTIVE:                                                                                                                                                                                                         SUBJECTIVE STATEMENT: Not performing HEP often at home d/t feeling unsteady and not being motivated but probably needs a review. 2/10 pain in knees today.  EVAL: Pt reports severe back pain started 2 months ago with a "knot" in his R flank. Pain has subsided somewhat but still feels like the knot is there. Pain limits walking tolerance - used to walk loop at [facility] but only able to walk across bridge currently. Pt also notes he feels more wobbly, worsening recently potentially secondary to decreased activity as a result of the back pain. Participates in the "PREP" program at the [facility] - education on diet and exercise.  PAIN: Are you having pain? Yes: NPRS scale: 0/10 Pain location: R>L mid/low back; R knee 2/10 Pain description: "knot", sore Aggravating factors: back extension machine at gym Relieving factors: laying flat on his back   PERTINENT HISTORY:  Atrial fibrillation ablation 12/24/22; SSS s/p pacemaker 12/13/19; B knee scope; L foot surgery; umbilical and B inguinal hernia repairs; HTN; osteoporosis; OA; psoriatic arthritis; RA; cervical vertebral fracture; thyroid disease - hypothyroidism; h/o Graves' disease; CKD; GERD; neuropathy; hearing loss; acute vertigo as of early June   PRECAUTIONS: None  WEIGHT BEARING RESTRICTIONS: No  FALLS:  Has patient fallen in last 6 months? No  LIVING ENVIRONMENT: Lives with: lives with their spouse Lives in: House/apartment Stairs: Yes: Internal: 8 or 7 steps; on right going up and on left going up and External: 1 steps; none (split level home) Has following equipment at home: Quad cane small base and Grab bars  OCCUPATION: Retired  PLOF: Independent and Leisure: walking with wife at [facility], participates  in [facility] "PREP" Program at the [employer]    PATIENT GOALS: "Walk steadily or stand for 1+ hours w/o pain."   OBJECTIVE: (objective measures completed at initial evaluation unless otherwise dated)  DIAGNOSTIC FINDINGS:  No current imaging available.  04/25/20 - Lumbar spine x-ray: FINDINGS: No fracture or spondylolisthesis is noted. Mild degenerative disc disease is noted at L3-4 with anterior osteophyte formation. Atherosclerosis of abdominal aorta is noted.  IMPRESSION: Mild degenerative disc disease is noted at L3-4. No acute abnormality seen in the lumbar spine.  04/25/20 - Thoracic spine x-ray: FINDINGS: No fracture or spondylolisthesis is noted. Mild S-shaped scoliosis of thoracic spine is noted. Osteophyte formation is noted at multiple levels in the middle and lower thoracic spine.   IMPRESSION: No acute abnormality seen in the thoracic spine. Multilevel degenerative disc disease is noted.  PATIENT SURVEYS:  Modified Oswestry 17 / 50 = 34.0 %  FOTO Lumbar = 47, predicted = 58  SCREENING FOR RED FLAGS: Bowel or bladder incontinence: No Spinal tumors: No Cauda equina syndrome: No Compression fracture: No Abdominal aneurysm: No  COGNITION:  Overall cognitive status: Within functional limits for tasks assessed and mild memory/word finding deficits     SENSATION: LE peripheral neuropathy  MUSCLE LENGTH: Hamstrings: mod tight B ITB: mild/mod tight B Piriformis: mod tight R, mild/mod tight L Hip flexors: mod/severe tight B Quads: mod tight B Heelcord: NT  POSTURE:  weight shift left  PALPATION: Increased muscle tension with R > L tenderness to palpation in QL as well as lateral glutes and piriformis  LUMBAR ROM:   Active  eval  Flexion Hands to mid shins - tight HS  Extension 40% limited - ^ R back pain  Right lateral flexion WFL  Left lateral flexion WFL - ^ R back pain  Right rotation 20% limited - ^ R back pain  Left rotation WFL  (Blank rows =  not tested)  LOWER EXTREMITY ROM:    B LE grossly WFL other than mild limitations in hip rotation  LOWER EXTREMITY MMT:    MMT  Right eval Left eval  Hip flexion 4+ 4+  Hip extension 4+ 4  Hip abduction 4- 4-  Hip adduction 4 3+  Hip internal rotation 4+ 4+  Hip external rotation 4- 4-  Knee flexion 5 5  Knee extension 5 5  Ankle dorsiflexion 4- 4-  Ankle plantarflexion 4- 3+  Ankle inversion    Ankle eversion    (Blank rows = not tested)  LUMBAR SPECIAL TESTS:  Straight leg raise test: Negative and Slump test: Negative  FUNCTIONAL TESTS: 06/14/23 5 times sit to stand: 15 sec Timed up and go (TUG): 10.20 sec 10 meter walk test: 9.6 sec Functional gait assessment: 21/30 medium risk for falls  GAIT: Distance walked: 80 ft Assistive device utilized: None Level of assistance: Complete Independence Gait pattern: lateral lean- Left Comments:    TODAY'S TREATMENT:  06/30/23 Therapeutic Exercise: to improve flexibility, strength and mobility.  Verbal and tactile cues throughout for technique Bike L1x69min Seated 3 way pball rollout stretch x 30 sec Thomas stretch x 30 sec Supine piriformis stretch x 30 sec Tandem stance in corner  Standing with feet together and head turns Tandem gait along counter Single leg RDL x 10 bil  06/28/23 Therapeutic Exercise: to improve flexibility, strength and mobility.  Verbal and tactile cues throughout for technique 890 ft Standing QL stretch B 2x30 sec Hip hinge at wall x 10 Dead lift 5#  to 8 inch step x 10 Sit to stand from chair + foam 5# weight press outs Sit to stand from chair + foam 5# wt  ea hand with bicep curl to [facility] press x  10 Lat pull standing 20# x 10 with ab contraction pulling down to waist Seated Rows 55# with ab contraction x 10 Single leg RDL with one hand on raised mat table x 10 B Standing partial lunge with rotations 5# wt x 10 B  06/23/23 Therapeutic Exercise: to improve flexibility, strength and  mobility.  Verbal and tactile cues throughout for technique Nustep L 5 x 6 min Standing QL stretch B 2x30 sec Hip hinge at wall x 10 Sit to stand from mat + foam 2x3 - bothers knees Seated low back stretch with swiss ball 5 x 10 ea fwd/L and R Prone pelvic press 5 sec hold x 5 Prone hip ext with press x 10 ea alternating Supine piriformis stretch x 30 sec B Supine active HS stretch x 10 B for cramping Supine TA contraction + march x 5 ea Supine squential march with TA x 5 ea   06/16/23 Therapeutic Exercise: to improve flexibility, strength and mobility.  Verbal and tactile cues throughout for technique Nustep L 5 x 6 min Standing QL stretch B 2x30 sec Seated low back stretch with swiss ball x 30 ea fwd/L and R Supine piriformis and hip flexor stretches x 30 sec ea B Standing hip ABD, EXT x 20 ea Standing hip ADD cone taps x 20 (needs one UE support when standing on LLE) Standing hip flex cone taps x 20  Heel Raises x 10, unilateral toe raises x 10 ea B gastroc stretch x 30 sec Tandem stance B multiple reps and with head turns   PATIENT EDUCATION:  Education details: initial HEP Person educated: Patient Education method: Programmer, multimedia, Facilities manager, and Handouts Education comprehension: verbalized understanding and returned demonstration  HOME EXERCISE PROGRAM: Access Code: [email or web address] URL: [email or web address] Date: 06/23/2023 Prepared by: Raynelle Fanning  Exercises - Supine Piriformis Stretch with Leg Straight  - 2 x daily - 7 x weekly - 1 sets - 3 reps - 30 sec hold - Modified Thomas Stretch  - 2 x daily - 7 x weekly - 1 sets - 3 reps - 30 sec hold - Seated Flexion Stretch with Swiss Ball  - 1 x daily - 7 x weekly - 1 sets - 10 reps - Seated Thoracic Flexion and Rotation with Swiss Ball  - 1 x daily - 7 x weekly - 1 sets - 10 reps - Tandem Stance in Corner  - 1 x daily - 3-4 x weekly - 1 sets - 10 reps - Tandem Stance with Head Rotation  - 1 x daily - 3-4 x weekly - 1  sets - 10 reps - Prone Hip Extension with Pillow Under Abdomen  - 1 x daily - 3-4 x weekly - 2 sets - 10 reps - Hooklying Sequential Leg March and Lower  - 1 x daily - 3-4 x weekly - 1 sets - 10 reps   ASSESSMENT:  CLINICAL IMPRESSION: Martin Rubio arrived noting less motivation to do exercises d/t low energy levels that some days. We reviewed some of HEP to ensure proper technique and to motivate him to do more exercises. We changed the tandem stance w/ head turns to just narrow BOS. He was very challenged with tandem balance. Martin Rubio continues to demonstrate potential for improvement and would benefit from continued skilled therapy to address impairments.     OBJECTIVE IMPAIRMENTS: decreased activity tolerance, decreased balance, decreased endurance, decreased knowledge of condition, decreased mobility, difficulty walking, decreased ROM, decreased strength, hypomobility, increased fascial restrictions, impaired perceived functional ability, increased muscle spasms, impaired  flexibility, improper body mechanics, postural dysfunction, and pain.   ACTIVITY LIMITATIONS: carrying, lifting, bending, sitting, standing, squatting, stairs, bed mobility, locomotion level, and caring for others  PARTICIPATION LIMITATIONS: cleaning, community activity, and yard work  PERSONAL FACTORS: Past/current experiences, Time since onset of injury/illness/exacerbation, and 3+ comorbidities: Atrial fibrillation ablation 12/24/22; SSS s/p pacemaker 12/13/19; B knee scope; L foot surgery; umbilical and B inguinal hernia repairs; HTN; osteoporosis; OA; psoriatic arthritis; RA; cervical vertebral fracture; thyroid disease - hypothyroidism; h/o Graves' disease; CKD; GERD; neuropathy; hearing loss; acute vertigo as of early June   are also affecting patient's functional outcome.   REHAB POTENTIAL: Good  CLINICAL DECISION MAKING: Stable/uncomplicated  EVALUATION COMPLEXITY: Low   GOALS: Goals reviewed with patient? Yes  SHORT  TERM GOALS: Target date: 06/23/2023  Patient will be independent with initial HEP to improve outcomes and carryover.  Baseline:  Goal status: IN PROGRESS partially compliant 06/28/23  LONG TERM GOALS: Target date: 07/14/2023  Patient will be independent with ongoing/advanced HEP for self-management at home.  Baseline:  Goal status: INITIAL  2.  Patient will report 50-75% improvement in low back pain to improve QOL.  Baseline: back pain recently up to 3-4/10 Goal status: INITIAL  3.  Patient to demonstrate ability to achieve and maintain good spinal alignment/posturing and body mechanics needed for daily activities. Baseline:  Goal status: INITIAL  4.  Patient will demonstrate functional pain free lumbar ROM to perform ADLs.   Baseline:  Goal status: INITIAL  5.  Patient will demonstrate improved B LE strength to grossly >/= 4+/5 for improved stability and ease of mobility . Baseline: Refer to above MMT table Goal status: INITIAL  6.  Patient will report 43 on lumbar FOTO to demonstrate improved functional ability.  Baseline: 47 Goal status: INITIAL   7. Patient will report </= 22% on Modified Oswestry to demonstrate improved functional ability with decreased pain interference. Baseline: 17 / 50 = 34.0 % Goal status: INITIAL  8.  Patient will tolerate >/= 1 hour of standing or walking w/o increased pain to allow for  improved mobility and activity tolerance. Baseline:  Goal status: INITIAL    PLAN:  PT FREQUENCY: 2x/week  PT DURATION: 6 weeks  PLANNED INTERVENTIONS: PatientTherapeutic exercises, Therapeutic activity, Neuromuscular re-education, Balance training, Gait training, Patient/Family education, Self Care, Joint mobilization, Aquatic Therapy, Dry Needling, Spinal manipulation, Spinal mobilization, Cryotherapy, Moist heat, Taping, Traction, Ultrasound, Manual therapy, and Re-evaluation  PLAN FOR NEXT SESSION: progress balance and strengthening; create initial HEP  for lumbopelvic flexibility and strengthening; MT +/- DN as indicated to address abnormal muscle tension   Darleene Cleaver, PTA  06/30/2023, 11:35 AM [facility]  [phone]  Fax: [phone]

## 2023-07-08 DIAGNOSIS — N138 Other obstructive and reflux uropathy: Secondary | ICD-10-CM | POA: Diagnosis not present

## 2023-07-08 DIAGNOSIS — N529 Male erectile dysfunction, unspecified: Secondary | ICD-10-CM | POA: Diagnosis not present

## 2023-07-08 DIAGNOSIS — N1832 Chronic kidney disease, stage 3b: Secondary | ICD-10-CM | POA: Diagnosis not present

## 2023-07-08 DIAGNOSIS — N200 Calculus of kidney: Secondary | ICD-10-CM | POA: Diagnosis not present

## 2023-07-08 DIAGNOSIS — N401 Enlarged prostate with lower urinary tract symptoms: Secondary | ICD-10-CM | POA: Diagnosis not present

## 2023-07-09 ENCOUNTER — Ambulatory Visit: Payer: Medicare Other | Attending: Family Medicine | Admitting: Physical Therapy

## 2023-07-09 ENCOUNTER — Encounter: Payer: Self-pay | Admitting: Physical Therapy

## 2023-07-09 DIAGNOSIS — M62830 Muscle spasm of back: Secondary | ICD-10-CM | POA: Diagnosis not present

## 2023-07-09 DIAGNOSIS — R262 Difficulty in walking, not elsewhere classified: Secondary | ICD-10-CM | POA: Insufficient documentation

## 2023-07-09 DIAGNOSIS — M6281 Muscle weakness (generalized): Secondary | ICD-10-CM | POA: Insufficient documentation

## 2023-07-09 DIAGNOSIS — M5459 Other low back pain: Secondary | ICD-10-CM | POA: Insufficient documentation

## 2023-07-09 DIAGNOSIS — R293 Abnormal posture: Secondary | ICD-10-CM | POA: Diagnosis not present

## 2023-07-09 DIAGNOSIS — R2681 Unsteadiness on feet: Secondary | ICD-10-CM | POA: Insufficient documentation

## 2023-07-09 DIAGNOSIS — R42 Dizziness and giddiness: Secondary | ICD-10-CM | POA: Insufficient documentation

## 2023-07-09 NOTE — Therapy (Signed)
OUTPATIENT PHYSICAL THERAPY THORACOLUMBAR TREATMENT   Patient Name: Martin Rubio MRN: [id number] DOB:[birth year], 77 y.o., male Today's Date: 07/09/2023  END OF SESSION:  PT End of Session - 07/09/23 0903     Visit Number 5    Date for PT Re-Evaluation 08/02/23    Authorization Type Medicare & [employer]    PT Start Time 0847    PT Stop Time 0927    PT Time Calculation (min) 40 min    Activity Tolerance Patient tolerated treatment well    Behavior During Therapy [facility] for tasks assessed/performed                 Past Medical History:  Diagnosis Date   Bronchitis    Diverticulosis    FH: BPH (benign prostatic hypertrophy)    History of Graves' disease 01/19/2018   S/p ablation   HTN (hypertension)    Hyperlipidemia    Obesity    Psoriatic arthritis (HCC)    Rheumatoid arthritis (HCC) 08/07/2014   Sleep apnea    CPAP   Thyroid disease    Past Surgical History:  Procedure Laterality Date   ABDOMINAL AORTOGRAM W/LOWER EXTREMITY N/A 01/14/2022   Procedure: ABDOMINAL AORTOGRAM W/LOWER EXTREMITY;  Surgeon: Iran Ouch, MD;  Location: [facility];  Service: Cardiovascular;  Laterality: N/A;   ATRIAL FIBRILLATION ABLATION N/A 12/24/2022   Procedure: ATRIAL FIBRILLATION ABLATION;  Surgeon: Maurice Small, MD;  Location: [facility] IN[facility];  Service: Cardiovascular;  Laterality: N/A;   INGUINAL HERNIA REPAIR     bilateral   KNEE SURGERY     bilateral arthroscopic   l foot surgery Left 12/2020   PACEMAKER IMPLANT N/A 12/13/2019   Procedure: PACEMAKER IMPLANT;  Surgeon: Marinus Maw, MD;  Location: [facility];  Service: Cardiovascular;  Laterality: N/A;   TRANSURETHRAL RESECTION OF PROSTATE     UMBILICAL HERNIA REPAIR     Patient Active Problem List   Diagnosis Date Noted   Benign paroxysmal positional vertigo 06/11/2023   Dark stools 06/11/2023   Strain of lumbar region 04/20/2023   Dyspepsia 02/25/2023   Stage 3b chronic kidney disease  (HCC) 07/15/2022   Aortic atherosclerosis (HCC) 03/18/2021   Bruit 02/09/2021   SOB (shortness of breath) 02/09/2021   Aortic valve sclerosis 02/09/2021   Hypercoagulable state due to paroxysmal atrial fibrillation (HCC) 03/05/2020   PCP notes >>>>>>>>>>>>>>> 01/10/2020   Age related osteoporosis 01/10/2020   CKD (chronic kidney disease), stage IV (HCC) 12/16/2019   Pacemaker 12/16/2019   Sick sinus syndrome (HCC) 12/16/2019   Paroxysmal atrial fibrillation (HCC) 10/26/2019   Nodule of flexor tendon sheath 04/13/2019   Hemorrhoids 06/24/2018   Low bone mass 01/19/2018   Iliac aneurysm (HCC) 06/16/2017   Hypertrophic cardiomyopathy (HCC) 06/16/2017   Essential hypertension 12/16/2016   Hyperlipidemia LDL goal <100 12/16/2016   Asthma in adult, mild intermittent, uncomplicated 10/12/2016   Collapsed vertebra, not elsewhere classified, cervical region, initial encounter for fracture (HCC) 10/08/2016   Annual physical exam 10/08/2016   Neuropathy 10/08/2016   Contracture of ankle and foot joint 08/07/2014   Dysphagia 08/07/2014   Esophageal reflux 08/07/2014   Erectile dysfunction 08/07/2014   Obstructive sleep apnea 08/07/2014   Osteoarthritis 08/07/2014   Rheumatoid arthritis (HCC) 08/07/2014   Sensorineural hearing loss 08/07/2014   Tinnitus 08/07/2014   LVH (left ventricular hypertrophy) 12/25/2012   Diverticulosis 09/01/2011   White coat hypertension 07/07/2011   Hypothyroidism 06/09/2011   Postablative hypothyroidism 06/09/2011  Psoriatic arthritis (HCC) 06/09/2011   Benign prostatic hyperplasia 06/09/2011    PCP: Wanda Plump, MD   REFERRING PROVIDER: Lenda Kelp, MD   REFERRING DIAG: [phone] (ICD-10-CM) - Lumbar strain, initial encounter   THERAPY DIAG:  Other low back pain  Muscle spasm of back  Muscle weakness (generalized)  Abnormal posture  Dizziness and giddiness  Difficulty in walking, not elsewhere classified  Unsteadiness on  feet  RATIONALE FOR EVALUATION AND TREATMENT: Rehabilitation  ONSET DATE: ~2 months  NEXT MD VISIT: None scheduled   SUBJECTIVE:                                                                                                                                                                                                         SUBJECTIVE STATEMENT:  I'm feeling wobbly today, that's "an item for me", no falls since last time. Back is on and off I need to work on my posture   EVAL: Pt reports severe back pain started 2 months ago with a "knot" in his R flank. Pain has subsided somewhat but still feels like the knot is there. Pain limits walking tolerance - used to walk loop at [facility] but only able to walk across bridge currently. Pt also notes he feels more wobbly, worsening recently potentially secondary to decreased activity as a result of the back pain. Participates in the "PREP" program at the [facility] - education on diet and exercise.  PAIN: Are you having pain? Yes: NPRS scale: 1/10 Pain location: knees on and off, R low back  Pain description: "knot", sore,  bone on bone  Aggravating factors: back extension machine at gym Relieving factors: laying flat on his back   PERTINENT HISTORY:  Atrial fibrillation ablation 12/24/22; SSS s/p pacemaker 12/13/19; B knee scope; L foot surgery; umbilical and B inguinal hernia repairs; HTN; osteoporosis; OA; psoriatic arthritis; RA; cervical vertebral fracture; thyroid disease - hypothyroidism; h/o Graves' disease; CKD; GERD; neuropathy; hearing loss; acute vertigo as of early June   PRECAUTIONS: None  WEIGHT BEARING RESTRICTIONS: No  FALLS:  Has patient fallen in last 6 months? No  LIVING ENVIRONMENT: Lives with: lives with their spouse Lives in: House/apartment Stairs: Yes: Internal: 8 or 7 steps; on right going up and on left going up and External: 1 steps; none (split level home) Has following equipment at home: Quad cane small  base and Grab bars  OCCUPATION: Retired  PLOF: Independent and Leisure: walking with wife at [facility], participates in [place] "PREP" Program at the [employer]  PATIENT GOALS: "Walk steadily or stand for 1+ hours w/o pain."   OBJECTIVE: (objective measures completed at initial evaluation unless otherwise dated)  DIAGNOSTIC FINDINGS:  No current imaging available.  04/25/20 - Lumbar spine x-ray: FINDINGS: No fracture or spondylolisthesis is noted. Mild degenerative disc disease is noted at L3-4 with anterior osteophyte formation. Atherosclerosis of abdominal aorta is noted.  IMPRESSION: Mild degenerative disc disease is noted at L3-4. No acute abnormality seen in the lumbar spine.  04/25/20 - Thoracic spine x-ray: FINDINGS: No fracture or spondylolisthesis is noted. Mild S-shaped scoliosis of thoracic spine is noted. Osteophyte formation is noted at multiple levels in the middle and lower thoracic spine.   IMPRESSION: No acute abnormality seen in the thoracic spine. Multilevel degenerative disc disease is noted.  PATIENT SURVEYS:  Modified Oswestry 17 / 50 = 34.0 %  FOTO Lumbar = 47, predicted = 58  SCREENING FOR RED FLAGS: Bowel or bladder incontinence: No Spinal tumors: No Cauda equina syndrome: No Compression fracture: No Abdominal aneurysm: No  COGNITION:  Overall cognitive status: Within functional limits for tasks assessed and mild memory/word finding deficits     SENSATION: LE peripheral neuropathy  MUSCLE LENGTH: Hamstrings: mod tight B ITB: mild/mod tight B Piriformis: mod tight R, mild/mod tight L Hip flexors: mod/severe tight B Quads: mod tight B Heelcord: NT  POSTURE:  weight shift left  PALPATION: Increased muscle tension with R > L tenderness to palpation in QL as well as lateral glutes and piriformis  LUMBAR ROM:   Active  eval  Flexion Hands to mid shins - tight HS  Extension 40% limited - ^ R back pain  Right lateral flexion  WFL  Left lateral flexion WFL - ^ R back pain  Right rotation 20% limited - ^ R back pain  Left rotation WFL  (Blank rows = not tested)  LOWER EXTREMITY ROM:    B LE grossly WFL other than mild limitations in hip rotation  LOWER EXTREMITY MMT:    MMT  Right eval Left eval  Hip flexion 4+ 4+  Hip extension 4+ 4  Hip abduction 4- 4-  Hip adduction 4 3+  Hip internal rotation 4+ 4+  Hip external rotation 4- 4-  Knee flexion 5 5  Knee extension 5 5  Ankle dorsiflexion 4- 4-  Ankle plantarflexion 4- 3+  Ankle inversion    Ankle eversion    (Blank rows = not tested)  LUMBAR SPECIAL TESTS:  Straight leg raise test: Negative and Slump test: Negative  FUNCTIONAL TESTS: 06/14/23 5 times sit to stand: 15 sec Timed up and go (TUG): 10.20 sec 10 meter walk test: 9.6 sec Functional gait assessment: 21/30 medium risk for falls  GAIT: Distance walked: 80 ft Assistive device utilized: None Level of assistance: Complete Independence Gait pattern: lateral lean- Left Comments:    TODAY'S TREATMENT:    07/09/23  TherEx  Nustep L5 x8.5 minutes all 4 extremities  Standing hip hikes x15 B Standing hip hikes + ABD x10 B   NMR  Tandem stance blue foam pad 3x30 seconds  SLS one foot on 6 inch step 3x30 seconds solid surface Narrow BOS blue foam pad 1x90 seconds Lateral toe taps off blue foam pad x10 B  Retro toe taps off blue foam pad x10 B Forward and retro tandem gait x5 laps at the counter      06/30/23 Therapeutic Exercise: to improve flexibility, strength and mobility.  Verbal and tactile cues throughout for technique Bike L1x79min Seated  3 way pball rollout stretch x 30 sec Thomas stretch x 30 sec Supine piriformis stretch x 30 sec Tandem stance in corner  Standing with feet together and head turns Tandem gait along counter Single leg RDL x 10 bil  06/28/23 Therapeutic Exercise: to improve flexibility, strength and mobility.  Verbal and tactile cues throughout  for technique 890 ft Standing QL stretch B 2x30 sec Hip hinge at wall x 10 Dead lift 5#  to 8 inch step x 10 Sit to stand from chair + foam 5# weight press outs Sit to stand from chair + foam 5# wt  ea hand with bicep curl to OH press x 10 Lat pull standing 20# x 10 with ab contraction pulling down to waist Seated Rows 55# with ab contraction x 10 Single leg RDL with one hand on raised mat table x 10 B Standing partial lunge with rotations 5# wt x 10 B  06/23/23 Therapeutic Exercise: to improve flexibility, strength and mobility.  Verbal and tactile cues throughout for technique Nustep L 5 x 6 min Standing QL stretch B 2x30 sec Hip hinge at wall x 10 Sit to stand from mat + foam 2x3 - bothers knees Seated low back stretch with swiss ball 5 x 10 ea fwd/L and R Prone pelvic press 5 sec hold x 5 Prone hip ext with press x 10 ea alternating Supine piriformis stretch x 30 sec B Supine active HS stretch x 10 B for cramping Supine TA contraction + march x 5 ea Supine squential march with TA x 5 ea   06/16/23 Therapeutic Exercise: to improve flexibility, strength and mobility.  Verbal and tactile cues throughout for technique Nustep L 5 x 6 min Standing QL stretch B 2x30 sec Seated low back stretch with swiss ball x 30 ea fwd/L and R Supine piriformis and hip flexor stretches x 30 sec ea B Standing hip ABD, EXT x 20 ea Standing hip ADD cone taps x 20 (needs one UE support when standing on LLE) Standing hip flex cone taps x 20  Heel Raises x 10, unilateral toe raises x 10 ea B gastroc stretch x 30 sec Tandem stance B multiple reps and with head turns   PATIENT EDUCATION:  Education details: initial HEP Person educated: Patient Education method: Programmer, multimedia, Facilities manager, and Handouts Education comprehension: verbalized understanding and returned demonstration  HOME EXERCISE PROGRAM: Access Code: [email or web address] URL: [email or web address] Date: 06/23/2023 Prepared  by: Raynelle Fanning  Exercises - Supine Piriformis Stretch with Leg Straight  - 2 x daily - 7 x weekly - 1 sets - 3 reps - 30 sec hold - Modified Thomas Stretch  - 2 x daily - 7 x weekly - 1 sets - 3 reps - 30 sec hold - Seated Flexion Stretch with Swiss Ball  - 1 x daily - 7 x weekly - 1 sets - 10 reps - Seated Thoracic Flexion and Rotation with Swiss Ball  - 1 x daily - 7 x weekly - 1 sets - 10 reps - Tandem Stance in Corner  - 1 x daily - 3-4 x weekly - 1 sets - 10 reps - Tandem Stance with Head Rotation  - 1 x daily - 3-4 x weekly - 1 sets - 10 reps - Prone Hip Extension with Pillow Under Abdomen  - 1 x daily - 3-4 x weekly - 2 sets - 10 reps - Hooklying Sequential Leg March and Lower  - 1 x daily - 3-4 x weekly - 1 sets -  10 reps   ASSESSMENT:  CLINICAL IMPRESSION:   Theodoro Grist arrives today very pleasant and talkative still very concerned about his balance- we focused on this primarily today. Did well but very challenged with balance tasks on unstable surfaces. Motivated to improve, we will continue efforts.    OBJECTIVE IMPAIRMENTS: decreased activity tolerance, decreased balance, decreased endurance, decreased knowledge of condition, decreased mobility, difficulty walking, decreased ROM, decreased strength, hypomobility, increased fascial restrictions, impaired perceived functional ability, increased muscle spasms, impaired flexibility, improper body mechanics, postural dysfunction, and pain.   ACTIVITY LIMITATIONS: carrying, lifting, bending, sitting, standing, squatting, stairs, bed mobility, locomotion level, and caring for others  PARTICIPATION LIMITATIONS: cleaning, community activity, and yard work  PERSONAL FACTORS: Past/current experiences, Time since onset of injury/illness/exacerbation, and 3+ comorbidities: Atrial fibrillation ablation 12/24/22; SSS s/p pacemaker 12/13/19; B knee scope; L foot surgery; umbilical and B inguinal hernia repairs; HTN; osteoporosis; OA; psoriatic arthritis; RA;  cervical vertebral fracture; thyroid disease - hypothyroidism; h/o Graves' disease; CKD; GERD; neuropathy; hearing loss; acute vertigo as of early June   are also affecting patient's functional outcome.   REHAB POTENTIAL: Good  CLINICAL DECISION MAKING: Stable/uncomplicated  EVALUATION COMPLEXITY: Low   GOALS: Goals reviewed with patient? Yes  SHORT TERM GOALS: Target date: 06/23/2023  Patient will be independent with initial HEP to improve outcomes and carryover.  Baseline:  Goal status: IN PROGRESS partially compliant 06/28/23  LONG TERM GOALS: Target date: 07/14/2023  Patient will be independent with ongoing/advanced HEP for self-management at home.  Baseline:  Goal status: INITIAL  2.  Patient will report 50-75% improvement in low back pain to improve QOL.  Baseline: back pain recently up to 3-4/10 Goal status: INITIAL  3.  Patient to demonstrate ability to achieve and maintain good spinal alignment/posturing and body mechanics needed for daily activities. Baseline:  Goal status: INITIAL  4.  Patient will demonstrate functional pain free lumbar ROM to perform ADLs.   Baseline:  Goal status: INITIAL  5.  Patient will demonstrate improved B LE strength to grossly >/= 4+/5 for improved stability and ease of mobility . Baseline: Refer to above MMT table Goal status: INITIAL  6.  Patient will report 55 on lumbar FOTO to demonstrate improved functional ability.  Baseline: 47 Goal status: INITIAL   7. Patient will report </= 22% on Modified Oswestry to demonstrate improved functional ability with decreased pain interference. Baseline: 17 / 50 = 34.0 % Goal status: INITIAL  8.  Patient will tolerate >/= 1 hour of standing or walking w/o increased pain to allow for  improved mobility and activity tolerance. Baseline:  Goal status: INITIAL    PLAN:  PT FREQUENCY: 2x/week  PT DURATION: 6 weeks  PLANNED INTERVENTIONS: PatientTherapeutic exercises, Therapeutic activity,  Neuromuscular re-education, Balance training, Gait training, Patient/Family education, Self Care, Joint mobilization, Aquatic Therapy, Dry Needling, Spinal manipulation, Spinal mobilization, Cryotherapy, Moist heat, Taping, Traction, Ultrasound, Manual therapy, and Re-evaluation  PLAN FOR NEXT SESSION: progress balance and strengthening; create initial HEP for lumbopelvic flexibility and strengthening; MT +/- DN as indicated to address abnormal muscle tension. Continue balance work, add more core strength to help control back pain with dynamic movements   Nedra Hai, PT, DPT 07/09/23 9:27 AM  [facility]  [phone]  Fax: [phone]

## 2023-07-11 NOTE — Therapy (Signed)
OUTPATIENT PHYSICAL THERAPY THORACOLUMBAR TREATMENT     Patient Name: Martin Rubio MRN: [id number] DOB:November 16, 1946, 77 y.o., male Today's Date: 07/12/2023  END OF SESSION:  PT End of Session - 07/12/23 1018     Visit Number 6    Date for PT Re-Evaluation 08/02/23    Authorization Type Medicare & [employer]    PT Start Time 1018    PT Stop Time 1101    PT Time Calculation (min) 43 min    Activity Tolerance Patient tolerated treatment well    Behavior During Therapy [facility] for tasks assessed/performed                  Past Medical History:  Diagnosis Date   Bronchitis    Diverticulosis    FH: BPH (benign prostatic hypertrophy)    History of Graves' disease 01/19/2018   S/p ablation   HTN (hypertension)    Hyperlipidemia    Obesity    Psoriatic arthritis (HCC)    Rheumatoid arthritis (HCC) 08/07/2014   Sleep apnea    CPAP   Thyroid disease    Past Surgical History:  Procedure Laterality Date   ABDOMINAL AORTOGRAM W/LOWER EXTREMITY N/A 01/14/2022   Procedure: ABDOMINAL AORTOGRAM W/LOWER EXTREMITY;  Surgeon: Iran Ouch, MD;  Location: [facility];  Service: Cardiovascular;  Laterality: N/A;   ATRIAL FIBRILLATION ABLATION N/A 12/24/2022   Procedure: ATRIAL FIBRILLATION ABLATION;  Surgeon: Maurice Small, MD;  Location: [facility];  Service: Cardiovascular;  Laterality: N/A;   INGUINAL HERNIA REPAIR     bilateral   KNEE SURGERY     bilateral arthroscopic   l foot surgery Left 12/2020   PACEMAKER IMPLANT N/A 12/13/2019   Procedure: PACEMAKER IMPLANT;  Surgeon: Marinus Maw, MD;  Location: [facility];  Service: Cardiovascular;  Laterality: N/A;   TRANSURETHRAL RESECTION OF PROSTATE     UMBILICAL HERNIA REPAIR     Patient Active Problem List   Diagnosis Date Noted   Benign paroxysmal positional vertigo 06/11/2023   Dark stools 06/11/2023   Strain of lumbar region 04/20/2023   Dyspepsia 02/25/2023   Stage 3b chronic kidney  disease (HCC) 07/15/2022   Aortic atherosclerosis (HCC) 03/18/2021   Bruit 02/09/2021   SOB (shortness of breath) 02/09/2021   Aortic valve sclerosis 02/09/2021   Hypercoagulable state due to paroxysmal atrial fibrillation (HCC) 03/05/2020   PCP notes >>>>>>>>>>>>>>> 01/10/2020   Age related osteoporosis 01/10/2020   CKD (chronic kidney disease), stage IV (HCC) 12/16/2019   Pacemaker 12/16/2019   Sick sinus syndrome (HCC) 12/16/2019   Paroxysmal atrial fibrillation (HCC) 10/26/2019   Nodule of flexor tendon sheath 04/13/2019   Hemorrhoids 06/24/2018   Low bone mass 01/19/2018   Iliac aneurysm (HCC) 06/16/2017   Hypertrophic cardiomyopathy (HCC) 06/16/2017   Essential hypertension 12/16/2016   Hyperlipidemia LDL goal <100 12/16/2016   Asthma in adult, mild intermittent, uncomplicated 10/12/2016   Collapsed vertebra, not elsewhere classified, cervical region, initial encounter for fracture (HCC) 10/08/2016   Annual physical exam 10/08/2016   Neuropathy 10/08/2016   Contracture of ankle and foot joint 08/07/2014   Dysphagia 08/07/2014   Esophageal reflux 08/07/2014   Erectile dysfunction 08/07/2014   Obstructive sleep apnea 08/07/2014   Osteoarthritis 08/07/2014   Rheumatoid arthritis (HCC) 08/07/2014   Sensorineural hearing loss 08/07/2014   Tinnitus 08/07/2014   LVH (left ventricular hypertrophy) 12/25/2012   Diverticulosis 09/01/2011   White coat hypertension 07/07/2011   Hypothyroidism 06/09/2011   Postablative  hypothyroidism 06/09/2011   Psoriatic arthritis (HCC) 06/09/2011   Benign prostatic hyperplasia 06/09/2011    PCP: Wanda Plump, MD   REFERRING PROVIDER: Lenda Kelp, MD   REFERRING DIAG: [phone] (ICD-10-CM) - Lumbar strain, initial encounter   THERAPY DIAG:  Other low back pain  Muscle spasm of back  Muscle weakness (generalized)  Abnormal posture  RATIONALE FOR EVALUATION AND TREATMENT: Rehabilitation  ONSET DATE: ~2 months  NEXT MD VISIT:  None scheduled   SUBJECTIVE:                                                                                                                                                                                                         SUBJECTIVE STATEMENT: No new complaints today.   EVAL: Pt reports severe back pain started 2 months ago with a "knot" in his R flank. Pain has subsided somewhat but still feels like the knot is there. Pain limits walking tolerance - used to walk loop at [facility] but only able to walk across bridge currently. Pt also notes he feels more wobbly, worsening recently potentially secondary to decreased activity as a result of the back pain. Participates in the "PREP" program at the [facility] - education on diet and exercise.  PAIN: Are you having pain? Yes: NPRS scale: 1/10 Pain location: knees on and off, R low back  Pain description: "knot", sore,  bone on bone  Aggravating factors: back extension machine at gym Relieving factors: laying flat on his back   PERTINENT HISTORY:  Atrial fibrillation ablation 12/24/22; SSS s/p pacemaker 12/13/19; B knee scope; L foot surgery; umbilical and B inguinal hernia repairs; HTN; osteoporosis; OA; psoriatic arthritis; RA; cervical vertebral fracture; thyroid disease - hypothyroidism; h/o Graves' disease; CKD; GERD; neuropathy; hearing loss; acute vertigo as of early June   PRECAUTIONS: None  WEIGHT BEARING RESTRICTIONS: No  FALLS:  Has patient fallen in last 6 months? No  LIVING ENVIRONMENT: Lives with: lives with their spouse Lives in: House/apartment Stairs: Yes: Internal: 8 or 7 steps; on right going up and on left going up and External: 1 steps; none (split level home) Has following equipment at home: Quad cane small base and Grab bars  OCCUPATION: Retired  PLOF: Independent and Leisure: walking with wife at [facility], participates in [place] "PREP" Program at the [employer]    PATIENT GOALS: "Walk steadily  or stand for 1+ hours w/o pain."   OBJECTIVE: (objective measures completed at initial evaluation unless otherwise dated)  DIAGNOSTIC FINDINGS:  No current imaging available.  04/25/20 - Lumbar spine x-ray: FINDINGS: No fracture or spondylolisthesis is noted. Mild degenerative disc disease is noted at L3-4 with anterior osteophyte formation. Atherosclerosis of abdominal aorta is noted.  IMPRESSION: Mild degenerative disc disease is noted at L3-4. No acute abnormality seen in the lumbar spine.  04/25/20 - Thoracic spine x-ray: FINDINGS: No fracture or spondylolisthesis is noted. Mild S-shaped scoliosis of thoracic spine is noted. Osteophyte formation is noted at multiple levels in the middle and lower thoracic spine.   IMPRESSION: No acute abnormality seen in the thoracic spine. Multilevel degenerative disc disease is noted.  PATIENT SURVEYS:  Modified Oswestry 17 / 50 = 34.0 %  FOTO Lumbar = 47, predicted = 58  SCREENING FOR RED FLAGS: Bowel or bladder incontinence: No Spinal tumors: No Cauda equina syndrome: No Compression fracture: No Abdominal aneurysm: No  COGNITION:  Overall cognitive status: Within functional limits for tasks assessed and mild memory/word finding deficits     SENSATION: LE peripheral neuropathy  MUSCLE LENGTH: Hamstrings: mod tight B ITB: mild/mod tight B Piriformis: mod tight R, mild/mod tight L Hip flexors: mod/severe tight B Quads: mod tight B Heelcord: NT  POSTURE:  weight shift left  PALPATION: Increased muscle tension with R > L tenderness to palpation in QL as well as lateral glutes and piriformis  LUMBAR ROM:   Active  eval  Flexion Hands to mid shins - tight HS  Extension 40% limited - ^ R back pain  Right lateral flexion WFL  Left lateral flexion WFL - ^ R back pain  Right rotation 20% limited - ^ R back pain  Left rotation WFL  (Blank rows = not tested)  LOWER EXTREMITY ROM:    B LE grossly WFL other than mild  limitations in hip rotation  LOWER EXTREMITY MMT:    MMT  Right eval Left eval  Hip flexion 4+ 4+  Hip extension 4+ 4  Hip abduction 4- 4-  Hip adduction 4 3+  Hip internal rotation 4+ 4+  Hip external rotation 4- 4-  Knee flexion 5 5  Knee extension 5 5  Ankle dorsiflexion 4- 4-  Ankle plantarflexion 4- 3+  Ankle inversion    Ankle eversion    (Blank rows = not tested)  LUMBAR SPECIAL TESTS:  Straight leg raise test: Negative and Slump test: Negative  FUNCTIONAL TESTS: 06/14/23 5 times sit to stand: 15 sec Timed up and go (TUG): 10.20 sec 10 meter walk test: 9.6 sec Functional gait assessment: 21/30 medium risk for falls  GAIT: Distance walked: 80 ft Assistive device utilized: None Level of assistance: Complete Independence Gait pattern: lateral lean- Left Comments:    TODAY'S TREATMENT:   07/12/23 TherEx Bike L4 x 8 Standing hip hikes 2x10 B (really feels on L side) Standing hip hikes + ABD x10 B NMR Star taps x 5 ea B Tandem stance blue foam pad 3x30 seconds  Bwd and fwd tandem walking x 4 laps (42ft) SLS one foot on 6 inch step x 10 sec then with head turns x 6 B Narrow BOS blue foam pad 1x60 seconds Lateral toe taps off blue foam pad x10 B  Retro toe taps off blue foam pad x10 B   07/09/23  TherEx Nustep L5 x8.5 minutes all 4 extremities  Standing hip hikes x15 B Standing hip hikes + ABD x10 B NMR Tandem stance blue foam pad 3x30 seconds  SLS one foot on 6 inch step 3x30 seconds solid surface Narrow BOS blue foam pad 1x90  seconds Lateral toe taps off blue foam pad x10 B  Retro toe taps off blue foam pad x10 B Forward and retro tandem gait x5 laps at the counter   06/30/23 Therapeutic Exercise: to improve flexibility, strength and mobility.  Verbal and tactile cues throughout for technique Bike L1x7min Seated 3 way pball rollout stretch x 30 sec Thomas stretch x 30 sec Supine piriformis stretch x 30 sec Tandem stance in corner  Standing  with feet together and head turns Tandem gait along counter Single leg RDL x 10 bil  06/28/23 Therapeutic Exercise: to improve flexibility, strength and mobility.  Verbal and tactile cues throughout for technique 890 ft Standing QL stretch B 2x30 sec Hip hinge at wall x 10 Dead lift 5#  to 8 inch step x 10 Sit to stand from chair + foam 5# weight press outs Sit to stand from chair + foam 5# wt  ea hand with bicep curl to OH press x 10 Lat pull standing 20# x 10 with ab contraction pulling down to waist Seated Rows 55# with ab contraction x 10 Single leg RDL with one hand on raised mat table x 10 B Standing partial lunge with rotations 5# wt x 10 B   PATIENT EDUCATION:  Education details: initial HEP Person educated: Patient Education method: Explanation, Demonstration, and Handouts Education comprehension: verbalized understanding and returned demonstration  HOME EXERCISE PROGRAM: Access Code: [email or web address] URL: [email or web address] Date: 06/23/2023 Prepared by: Raynelle Fanning  Exercises - Supine Piriformis Stretch with Leg Straight  - 2 x daily - 7 x weekly - 1 sets - 3 reps - 30 sec hold - Modified Thomas Stretch  - 2 x daily - 7 x weekly - 1 sets - 3 reps - 30 sec hold - Seated Flexion Stretch with Swiss Ball  - 1 x daily - 7 x weekly - 1 sets - 10 reps - Seated Thoracic Flexion and Rotation with Swiss Ball  - 1 x daily - 7 x weekly - 1 sets - 10 reps - Tandem Stance in Corner  - 1 x daily - 3-4 x weekly - 1 sets - 10 reps - Tandem Stance with Head Rotation  - 1 x daily - 3-4 x weekly - 1 sets - 10 reps - Prone Hip Extension with Pillow Under Abdomen  - 1 x daily - 3-4 x weekly - 2 sets - 10 reps - Hooklying Sequential Leg March and Lower  - 1 x daily - 3-4 x weekly - 1 sets - 10 reps   ASSESSMENT:  CLINICAL IMPRESSION:  Theodoro Grist continues to be challenged with balance activities but improves with reps on all exercises. Left gluteus medius has greater functional  weakness than left. Plan to assess goals next visit and possibly d/c.   OBJECTIVE IMPAIRMENTS: decreased activity tolerance, decreased balance, decreased endurance, decreased knowledge of condition, decreased mobility, difficulty walking, decreased ROM, decreased strength, hypomobility, increased fascial restrictions, impaired perceived functional ability, increased muscle spasms, impaired flexibility, improper body mechanics, postural dysfunction, and pain.   ACTIVITY LIMITATIONS: carrying, lifting, bending, sitting, standing, squatting, stairs, bed mobility, locomotion level, and caring for others  PARTICIPATION LIMITATIONS: cleaning, community activity, and yard work  PERSONAL FACTORS: Past/current experiences, Time since onset of injury/illness/exacerbation, and 3+ comorbidities: Atrial fibrillation ablation 12/24/22; SSS s/p pacemaker 12/13/19; B knee scope; L foot surgery; umbilical and B inguinal hernia repairs; HTN; osteoporosis; OA; psoriatic arthritis; RA; cervical vertebral fracture; thyroid disease - hypothyroidism; h/o Graves' disease; CKD; GERD; neuropathy; hearing loss;  acute vertigo as of early June   are also affecting patient's functional outcome.   REHAB POTENTIAL: Good  CLINICAL DECISION MAKING: Stable/uncomplicated  EVALUATION COMPLEXITY: Low   GOALS: Goals reviewed with patient? Yes  SHORT TERM GOALS: Target date: 06/23/2023  Patient will be independent with initial HEP to improve outcomes and carryover.  Baseline:  Goal status: IN PROGRESS partially compliant 06/28/23  LONG TERM GOALS: Target date: 07/14/2023  Patient will be independent with ongoing/advanced HEP for self-management at home.  Baseline:  Goal status: INITIAL  2.  Patient will report 50-75% improvement in low back pain to improve QOL.  Baseline: back pain recently up to 3-4/10 Goal status: INITIAL  3.  Patient to demonstrate ability to achieve and maintain good spinal alignment/posturing and body  mechanics needed for daily activities. Baseline:  Goal status: INITIAL  4.  Patient will demonstrate functional pain free lumbar ROM to perform ADLs.   Baseline:  Goal status: INITIAL  5.  Patient will demonstrate improved B LE strength to grossly >/= 4+/5 for improved stability and ease of mobility . Baseline: Refer to above MMT table Goal status: INITIAL  6.  Patient will report 66 on lumbar FOTO to demonstrate improved functional ability.  Baseline: 47 Goal status: INITIAL   7. Patient will report </= 22% on Modified Oswestry to demonstrate improved functional ability with decreased pain interference. Baseline: 17 / 50 = 34.0 % Goal status: INITIAL  8.  Patient will tolerate >/= 1 hour of standing or walking w/o increased pain to allow for  improved mobility and activity tolerance. Baseline:  Goal status: INITIAL    PLAN:  PT FREQUENCY: 2x/week  PT DURATION: 6 weeks  PLANNED INTERVENTIONS: PatientTherapeutic exercises, Therapeutic activity, Neuromuscular re-education, Balance training, Gait training, Patient/Family education, Self Care, Joint mobilization, Aquatic Therapy, Dry Needling, Spinal manipulation, Spinal mobilization, Cryotherapy, Moist heat, Taping, Traction, Ultrasound, Manual therapy, and Re-evaluation  PLAN FOR NEXT SESSION: progress balance and strengthening; create initial HEP for lumbopelvic flexibility and strengthening; MT +/- DN as indicated to address abnormal muscle tension. Continue balance work, add more core strength to help control back pain with dynamic movements   Solon Palm, PT 07/12/23 11:04 AM  [facility]  [phone]  Fax: [phone]

## 2023-07-12 ENCOUNTER — Ambulatory Visit: Payer: Medicare Other | Admitting: Physical Therapy

## 2023-07-12 DIAGNOSIS — M6281 Muscle weakness (generalized): Secondary | ICD-10-CM | POA: Diagnosis not present

## 2023-07-12 DIAGNOSIS — M5459 Other low back pain: Secondary | ICD-10-CM

## 2023-07-12 DIAGNOSIS — R293 Abnormal posture: Secondary | ICD-10-CM

## 2023-07-12 DIAGNOSIS — R42 Dizziness and giddiness: Secondary | ICD-10-CM | POA: Diagnosis not present

## 2023-07-12 DIAGNOSIS — R262 Difficulty in walking, not elsewhere classified: Secondary | ICD-10-CM | POA: Diagnosis not present

## 2023-07-12 DIAGNOSIS — M62830 Muscle spasm of back: Secondary | ICD-10-CM

## 2023-07-13 NOTE — Therapy (Signed)
OUTPATIENT PHYSICAL THERAPY THORACOLUMBAR TREATMENT     Patient Name: Martin Rubio MRN: [id number] DOB:[birth year], 77 y.o., male Today's Date: 07/13/2023  END OF SESSION:         Past Medical History:  Diagnosis Date   Bronchitis    Diverticulosis    FH: BPH (benign prostatic hypertrophy)    History of Graves' disease 01/19/2018   S/p ablation   HTN (hypertension)    Hyperlipidemia    Obesity    Psoriatic arthritis (HCC)    Rheumatoid arthritis (HCC) 08/07/2014   Sleep apnea    CPAP   Thyroid disease    Past Surgical History:  Procedure Laterality Date   ABDOMINAL AORTOGRAM W/LOWER EXTREMITY N/A 01/14/2022   Procedure: ABDOMINAL AORTOGRAM W/LOWER EXTREMITY;  Surgeon: Iran Ouch, MD;  Location: [facility];  Service: Cardiovascular;  Laterality: N/A;   ATRIAL FIBRILLATION ABLATION N/A 12/24/2022   Procedure: ATRIAL FIBRILLATION ABLATION;  Surgeon: Maurice Small, MD;  Location: [facility];  Service: Cardiovascular;  Laterality: N/A;   INGUINAL HERNIA REPAIR     bilateral   KNEE SURGERY     bilateral arthroscopic   l foot surgery Left 12/2020   PACEMAKER IMPLANT N/A 12/13/2019   Procedure: PACEMAKER IMPLANT;  Surgeon: Marinus Maw, MD;  Location: [facility];  Service: Cardiovascular;  Laterality: N/A;   TRANSURETHRAL RESECTION OF PROSTATE     UMBILICAL HERNIA REPAIR     Patient Active Problem List   Diagnosis Date Noted   Benign paroxysmal positional vertigo 06/11/2023   Dark stools 06/11/2023   Strain of lumbar region 04/20/2023   Dyspepsia 02/25/2023   Stage 3b chronic kidney disease (HCC) 07/15/2022   Aortic atherosclerosis (HCC) 03/18/2021   Bruit 02/09/2021   SOB (shortness of breath) 02/09/2021   Aortic valve sclerosis 02/09/2021   Hypercoagulable state due to paroxysmal atrial fibrillation (HCC) 03/05/2020   PCP notes >>>>>>>>>>>>>>> 01/10/2020   Age related osteoporosis 01/10/2020   CKD (chronic kidney disease),  stage IV (HCC) 12/16/2019   Pacemaker 12/16/2019   Sick sinus syndrome (HCC) 12/16/2019   Paroxysmal atrial fibrillation (HCC) 10/26/2019   Nodule of flexor tendon sheath 04/13/2019   Hemorrhoids 06/24/2018   Low bone mass 01/19/2018   Iliac aneurysm (HCC) 06/16/2017   Hypertrophic cardiomyopathy (HCC) 06/16/2017   Essential hypertension 12/16/2016   Hyperlipidemia LDL goal <100 12/16/2016   Asthma in adult, mild intermittent, uncomplicated 10/12/2016   Collapsed vertebra, not elsewhere classified, cervical region, initial encounter for fracture (HCC) 10/08/2016   Annual physical exam 10/08/2016   Neuropathy 10/08/2016   Contracture of ankle and foot joint 08/07/2014   Dysphagia 08/07/2014   Esophageal reflux 08/07/2014   Erectile dysfunction 08/07/2014   Obstructive sleep apnea 08/07/2014   Osteoarthritis 08/07/2014   Rheumatoid arthritis (HCC) 08/07/2014   Sensorineural hearing loss 08/07/2014   Tinnitus 08/07/2014   LVH (left ventricular hypertrophy) 12/25/2012   Diverticulosis 09/01/2011   White coat hypertension 07/07/2011   Hypothyroidism 06/09/2011   Postablative hypothyroidism 06/09/2011   Psoriatic arthritis (HCC) 06/09/2011   Benign prostatic hyperplasia 06/09/2011    PCP: Wanda Plump, MD   REFERRING PROVIDER: Lenda Kelp, MD   REFERRING DIAG: S39.012A (ICD-10-CM) - Lumbar strain, initial encounter   THERAPY DIAG:  No diagnosis found.  RATIONALE FOR EVALUATION AND TREATMENT: Rehabilitation  ONSET DATE: ~2 months  NEXT MD VISIT: None scheduled   SUBJECTIVE:  SUBJECTIVE STATEMENT: ***   EVAL: Pt reports severe back pain started 2 months ago with a "knot" in his R flank. Pain has subsided somewhat but still feels like the knot is there. Pain limits  walking tolerance - used to walk loop at [facility] but only able to walk across bridge currently. Pt also notes he feels more wobbly, worsening recently potentially secondary to decreased activity as a result of the back pain. Participates in the "PREP" program at the [facility] - education on diet and exercise.  PAIN: Are you having pain? Yes: NPRS scale: 1/10 Pain location: knees on and off, R low back  Pain description: "knot", sore,  bone on bone  Aggravating factors: back extension machine at gym Relieving factors: laying flat on his back   PERTINENT HISTORY:  Atrial fibrillation ablation 12/24/22; SSS s/p pacemaker 12/13/19; B knee scope; L foot surgery; umbilical and B inguinal hernia repairs; HTN; osteoporosis; OA; psoriatic arthritis; RA; cervical vertebral fracture; thyroid disease - hypothyroidism; h/o Graves' disease; CKD; GERD; neuropathy; hearing loss; acute vertigo as of early June   PRECAUTIONS: None  WEIGHT BEARING RESTRICTIONS: No  FALLS:  Has patient fallen in last 6 months? No  LIVING ENVIRONMENT: Lives with: lives with their spouse Lives in: House/apartment Stairs: Yes: Internal: 8 or 7 steps; on right going up and on left going up and External: 1 steps; none (split level home) Has following equipment at home: Quad cane small base and Grab bars  OCCUPATION: Retired  PLOF: Independent and Leisure: walking with wife at [facility], participates in [employer] "PREP" Program at the [employer]    PATIENT GOALS: "Walk steadily or stand for 1+ hours w/o pain."   OBJECTIVE: (objective measures completed at initial evaluation unless otherwise dated)  DIAGNOSTIC FINDINGS:  No current imaging available.  04/25/20 - Lumbar spine x-ray: FINDINGS: No fracture or spondylolisthesis is noted. Mild degenerative disc disease is noted at L3-4 with anterior osteophyte formation. Atherosclerosis of abdominal aorta is noted.  IMPRESSION: Mild degenerative disc disease is  noted at L3-4. No acute abnormality seen in the lumbar spine.  04/25/20 - Thoracic spine x-ray: FINDINGS: No fracture or spondylolisthesis is noted. Mild S-shaped scoliosis of thoracic spine is noted. Osteophyte formation is noted at multiple levels in the middle and lower thoracic spine.   IMPRESSION: No acute abnormality seen in the thoracic spine. Multilevel degenerative disc disease is noted.  PATIENT SURVEYS:  Modified Oswestry 17 / 50 = 34.0 %  FOTO Lumbar = 47, predicted = 58  SCREENING FOR RED FLAGS: Bowel or bladder incontinence: No Spinal tumors: No Cauda equina syndrome: No Compression fracture: No Abdominal aneurysm: No  COGNITION:  Overall cognitive status: Within functional limits for tasks assessed and mild memory/word finding deficits     SENSATION: LE peripheral neuropathy  MUSCLE LENGTH: Hamstrings: mod tight B ITB: mild/mod tight B Piriformis: mod tight R, mild/mod tight L Hip flexors: mod/severe tight B Quads: mod tight B Heelcord: NT  POSTURE:  weight shift left  PALPATION: Increased muscle tension with R > L tenderness to palpation in QL as well as lateral glutes and piriformis  LUMBAR ROM:   Active  eval  Flexion Hands to mid shins - tight HS  Extension 40% limited - ^ R back pain  Right lateral flexion WFL  Left lateral flexion WFL - ^ R back pain  Right rotation 20% limited - ^ R back pain  Left rotation WFL  (Blank rows = not tested)  LOWER EXTREMITY ROM:    B LE grossly WFL other than mild limitations in hip rotation  LOWER EXTREMITY MMT:    MMT  Right eval Left eval Right 07/14/23 Left 07/14/23  Hip flexion 4+ 4+    Hip extension 4+ 4    Hip abduction 4- 4-    Hip adduction 4 3+    Hip internal rotation 4+ 4+    Hip external rotation 4- 4-    Knee flexion 5 5    Knee extension 5 5    Ankle dorsiflexion 4- 4-    Ankle plantarflexion 4- 3+    Ankle inversion      Ankle eversion      (Blank rows = not tested)  LUMBAR  SPECIAL TESTS:  Straight leg raise test: Negative and Slump test: Negative  FUNCTIONAL TESTS: 06/14/23 5 times sit to stand: 15 sec Timed up and go (TUG): 10.20 sec 10 meter walk test: 9.6 sec Functional gait assessment: 21/30 medium risk for falls  GAIT: Distance walked: 80 ft Assistive device utilized: None Level of assistance: Complete Independence Gait pattern: lateral lean- Left Comments:    TODAY'S TREATMENT:   07/14/23 TherAct:  FOTO, MOD OSWESTRY GOALS MMT   TherEx Bike L4 x 8 Standing hip hikes 2x10 B (really feels on L side) Standing hip hikes + ABD x10 B NMR Star taps x 5 ea B Tandem stance blue foam pad 3x30 seconds  Bwd and fwd tandem walking x 4 laps (26ft) SLS one foot on 6 inch step x 10 sec then with head turns x 6 B Narrow BOS blue foam pad 1x60 seconds Lateral toe taps off blue foam pad x10 B  Retro toe taps off blue foam pad x10 B  07/12/23 TherEx Bike L4 x 8 Standing hip hikes 2x10 B (really feels on L side) Standing hip hikes + ABD x10 B NMR Star taps x 5 ea B Tandem stance blue foam pad 3x30 seconds  Bwd and fwd tandem walking x 4 laps (91ft) SLS one foot on 6 inch step x 10 sec then with head turns x 6 B Narrow BOS blue foam pad 1x60 seconds Lateral toe taps off blue foam pad x10 B  Retro toe taps off blue foam pad x10 B   07/09/23  TherEx Nustep L5 x8.5 minutes all 4 extremities  Standing hip hikes x15 B Standing hip hikes + ABD x10 B NMR Tandem stance blue foam pad 3x30 seconds  SLS one foot on 6 inch step 3x30 seconds solid surface Narrow BOS blue foam pad 1x90 seconds Lateral toe taps off blue foam pad x10 B  Retro toe taps off blue foam pad x10 B Forward and retro tandem gait x5 laps at the counter   06/30/23 Therapeutic Exercise: to improve flexibility, strength and mobility.  Verbal and tactile cues throughout for technique Bike L1x54min Seated 3 way pball rollout stretch x 30 sec Thomas stretch x 30 sec Supine  piriformis stretch x 30 sec Tandem stance in corner  Standing with feet together and head turns Tandem gait along counter Single leg RDL x 10 bil  06/28/23 Therapeutic Exercise: to improve flexibility, strength and mobility.  Verbal and tactile cues throughout for technique 890 ft Standing QL stretch B 2x30 sec Hip hinge at wall x 10 Dead lift 5#  to 8 inch step x 10 Sit to stand from chair + foam 5# weight press outs Sit to stand from chair + foam 5# wt  ea hand with bicep curl to OH press x 10 Lat pull standing 20# x 10 with ab contraction pulling down to waist Seated Rows 55# with ab contraction x 10 Single leg RDL with one hand on raised mat table x 10 B Standing partial lunge with rotations 5# wt x 10 B   PATIENT EDUCATION:  Education details: initial HEP Person educated: Patient Education method: Explanation, Demonstration, and Handouts Education comprehension: verbalized understanding and returned demonstration  HOME EXERCISE PROGRAM: Access Code: [email or web address] URL: [email or web address] Date: 06/23/2023 Prepared by: Raynelle Fanning  Exercises - Supine Piriformis Stretch with Leg Straight  - 2 x daily - 7 x weekly - 1 sets - 3 reps - 30 sec hold - Modified Thomas Stretch  - 2 x daily - 7 x weekly - 1 sets - 3 reps - 30 sec hold - Seated Flexion Stretch with Swiss Ball  - 1 x daily - 7 x weekly - 1 sets - 10 reps - Seated Thoracic Flexion and Rotation with Swiss Ball  - 1 x daily - 7 x weekly - 1 sets - 10 reps - Tandem Stance in Corner  - 1 x daily - 3-4 x weekly - 1 sets - 10 reps - Tandem Stance with Head Rotation  - 1 x daily - 3-4 x weekly - 1 sets - 10 reps - Prone Hip Extension with Pillow Under Abdomen  - 1 x daily - 3-4 x weekly - 2 sets - 10 reps - Hooklying Sequential Leg March and Lower  - 1 x daily - 3-4 x weekly - 1 sets - 10 reps   ASSESSMENT:  CLINICAL IMPRESSION:  ***   OBJECTIVE IMPAIRMENTS: decreased activity tolerance, decreased balance,  decreased endurance, decreased knowledge of condition, decreased mobility, difficulty walking, decreased ROM, decreased strength, hypomobility, increased fascial restrictions, impaired perceived functional ability, increased muscle spasms, impaired flexibility, improper body mechanics, postural dysfunction, and pain.   ACTIVITY LIMITATIONS: carrying, lifting, bending, sitting, standing, squatting, stairs, bed mobility, locomotion level, and caring for others  PARTICIPATION LIMITATIONS: cleaning, community activity, and yard work  PERSONAL FACTORS: Past/current experiences, Time since onset of injury/illness/exacerbation, and 3+ comorbidities: Atrial fibrillation ablation 12/24/22; SSS s/p pacemaker 12/13/19; B knee scope; L foot surgery; umbilical and B inguinal hernia repairs; HTN; osteoporosis; OA; psoriatic arthritis; RA; cervical vertebral fracture; thyroid disease - hypothyroidism; h/o Graves' disease; CKD; GERD; neuropathy; hearing loss; acute vertigo as of early June   are also affecting patient's functional outcome.   REHAB POTENTIAL: Good  CLINICAL DECISION MAKING: Stable/uncomplicated  EVALUATION COMPLEXITY: Low   GOALS: Goals reviewed with patient? Yes  SHORT TERM GOALS: Target date: 06/23/2023  Patient will be independent with initial HEP to improve outcomes and carryover.  Baseline:  Goal status: IN PROGRESS partially compliant 06/28/23  LONG TERM GOALS: Target date: 07/14/2023  Patient will be independent with ongoing/advanced HEP for self-management at home.  Baseline:  Goal status: INITIAL  2.  Patient will report 50-75% improvement in low back pain to improve QOL.  Baseline: back pain recently up to 3-4/10 Goal status: INITIAL  3.  Patient to demonstrate ability to achieve and maintain good spinal alignment/posturing and body mechanics needed for daily activities. Baseline:  Goal status: INITIAL  4.  Patient will demonstrate functional pain free lumbar ROM to perform  ADLs.   Baseline:  Goal status: INITIAL  5.  Patient will demonstrate improved B LE strength to grossly >/= 4+/5 for improved stability and ease of mobility . Baseline: Refer to  above MMT table Goal status: INITIAL  6.  Patient will report 69 on lumbar FOTO to demonstrate improved functional ability.  Baseline: 47 Goal status: INITIAL   7. Patient will report </= 22% on Modified Oswestry to demonstrate improved functional ability with decreased pain interference. Baseline: 17 / 50 = 34.0 % Goal status: INITIAL  8.  Patient will tolerate >/= 1 hour of standing or walking w/o increased pain to allow for  improved mobility and activity tolerance. Baseline:  Goal status: INITIAL    PLAN:  PT FREQUENCY: 2x/week  PT DURATION: 6 weeks  PLANNED INTERVENTIONS: PatientTherapeutic exercises, Therapeutic activity, Neuromuscular re-education, Balance training, Gait training, Patient/Family education, Self Care, Joint mobilization, Aquatic Therapy, Dry Needling, Spinal manipulation, Spinal mobilization, Cryotherapy, Moist heat, Taping, Traction, Ultrasound, Manual therapy, and Re-evaluation  PLAN FOR NEXT SESSION: progress balance and strengthening; create initial HEP for lumbopelvic flexibility and strengthening; MT +/- DN as indicated to address abnormal muscle tension. Continue balance work, add more core strength to help control back pain with dynamic movements   Solon Palm, PT 07/13/23 8:20 PM  [facility]  [phone]  Fax: [phone]

## 2023-07-14 ENCOUNTER — Ambulatory Visit: Payer: Medicare Other | Admitting: Physical Therapy

## 2023-07-14 ENCOUNTER — Encounter: Payer: Self-pay | Admitting: Physical Therapy

## 2023-07-14 DIAGNOSIS — M62830 Muscle spasm of back: Secondary | ICD-10-CM

## 2023-07-14 DIAGNOSIS — R262 Difficulty in walking, not elsewhere classified: Secondary | ICD-10-CM | POA: Diagnosis not present

## 2023-07-14 DIAGNOSIS — M5459 Other low back pain: Secondary | ICD-10-CM | POA: Diagnosis not present

## 2023-07-14 DIAGNOSIS — R42 Dizziness and giddiness: Secondary | ICD-10-CM | POA: Diagnosis not present

## 2023-07-14 DIAGNOSIS — M6281 Muscle weakness (generalized): Secondary | ICD-10-CM

## 2023-07-14 DIAGNOSIS — R293 Abnormal posture: Secondary | ICD-10-CM | POA: Diagnosis not present

## 2023-07-19 ENCOUNTER — Telehealth: Payer: Self-pay | Admitting: Internal Medicine

## 2023-07-19 DIAGNOSIS — G629 Polyneuropathy, unspecified: Secondary | ICD-10-CM

## 2023-07-19 NOTE — Addendum Note (Signed)
Addended byConrad Chalfant D on: 07/19/2023 03:49 PM   Modules accepted: Orders

## 2023-07-19 NOTE — Telephone Encounter (Signed)
Please advise 

## 2023-07-19 NOTE — Telephone Encounter (Signed)
Neurology referral placed

## 2023-07-19 NOTE — Telephone Encounter (Signed)
Issue was discussed 07/15/2022, see below. Recommend to schedule appointment here  to discuss further management of neuropathy. If he prefers, okay to refer to neurology.  Neuropathy: This is his main concern today, has a burning feeling at the L>R plantar area, currently on Lyrica 50 mg qhs ( RX by Dr. Samuel Bouche, orthopedics at [facility]) it is helping.   I Rx gabapentin the last time, he took it few times but caused somnolence.  He is only taking it as needed.  I advised that we could increase Lyrica dose and delete gabapentin but he prefers his current strategy. Offered referral to neurology, declined it, will think about it. He sees podiatry regularly for footcare.

## 2023-07-19 NOTE — Telephone Encounter (Signed)
Pt called to check to see if he had a referral put in for peripheral neuropathy. After reviewing chart, was unable to locate a referral and stated a message would be sent back to look into it. Pt stated he would like to have one put in if one hasn't been put in already. Please Advise.

## 2023-07-20 DIAGNOSIS — N1832 Chronic kidney disease, stage 3b: Secondary | ICD-10-CM | POA: Diagnosis not present

## 2023-07-26 DIAGNOSIS — Z87891 Personal history of nicotine dependence: Secondary | ICD-10-CM | POA: Diagnosis not present

## 2023-07-26 DIAGNOSIS — N1832 Chronic kidney disease, stage 3b: Secondary | ICD-10-CM | POA: Diagnosis not present

## 2023-07-26 DIAGNOSIS — I129 Hypertensive chronic kidney disease with stage 1 through stage 4 chronic kidney disease, or unspecified chronic kidney disease: Secondary | ICD-10-CM | POA: Diagnosis not present

## 2023-08-02 ENCOUNTER — Other Ambulatory Visit: Payer: Self-pay

## 2023-08-02 MED ORDER — HYDROXYZINE PAMOATE 25 MG PO CAPS: 25.0000 mg | ORAL_CAPSULE | Freq: Three times a day (TID) | ORAL | 3 refills | Status: DC | PRN

## 2023-08-18 ENCOUNTER — Telehealth: Payer: Self-pay | Admitting: Cardiology

## 2023-08-18 NOTE — Telephone Encounter (Signed)
Pt c/o medication issue:  1. Name of Medication:  Evolocumab (REPATHA SURECLICK) 140 MG/ML SOAJ rivaroxaban (XARELTO) 20 MG TABS tablet  2. How are you currently taking this medication (dosage and times per day)? As written  3. Are you having a reaction (difficulty breathing--STAT)? No  4. What is your medication issue? Patient is requesting to speak with Pharmacist Belenda Cruise in regards to both of these medications.

## 2023-08-19 NOTE — Telephone Encounter (Signed)
Spoke with patient - will sign him up for [employer] renewal as soon as it becomes available again.  Currently has 1 dose of Repatha at home, due on Sept 25.  Will offer sample (if available) in October if grant not open

## 2023-08-28 ENCOUNTER — Other Ambulatory Visit: Payer: Self-pay | Admitting: Internal Medicine

## 2023-08-30 ENCOUNTER — Other Ambulatory Visit: Payer: Self-pay

## 2023-08-30 MED ORDER — LEVOTHYROXINE SODIUM 137 MCG PO TABS: 137.0000 ug | ORAL_TABLET | Freq: Every day | ORAL | 1 refills | Status: DC

## 2023-09-01 ENCOUNTER — Encounter: Payer: Self-pay | Admitting: Podiatry

## 2023-09-01 ENCOUNTER — Ambulatory Visit (INDEPENDENT_AMBULATORY_CARE_PROVIDER_SITE_OTHER): Payer: Medicare Other | Admitting: Podiatry

## 2023-09-01 VITALS — BP 132/84 | HR 66 | Temp 96.9°F | Resp 18 | Ht 70.0 in | Wt 215.0 lb

## 2023-09-01 DIAGNOSIS — M79675 Pain in left toe(s): Secondary | ICD-10-CM

## 2023-09-01 DIAGNOSIS — M79674 Pain in right toe(s): Secondary | ICD-10-CM | POA: Diagnosis not present

## 2023-09-01 DIAGNOSIS — D2372 Other benign neoplasm of skin of left lower limb, including hip: Secondary | ICD-10-CM | POA: Diagnosis not present

## 2023-09-01 DIAGNOSIS — B351 Tinea unguium: Secondary | ICD-10-CM | POA: Diagnosis not present

## 2023-09-01 NOTE — Progress Notes (Signed)
   Chief Complaint  Patient presents with   Callouses    Patient is here for bilateral callouses    SUBJECTIVE Patient presents to office today complaining of elongated, thickened nails that cause pain while ambulating in shoes.  Patient is unable to trim their own nails.  Patient also has symptomatic skin lesions to the plantar aspect of the bilateral forefoot.  Patient is here for further evaluation and treatment.  Past Medical History:  Diagnosis Date   Bronchitis    Diverticulosis    FH: BPH (benign prostatic hypertrophy)    History of Graves' disease 01/19/2018   S/p ablation   HTN (hypertension)    Hyperlipidemia    Obesity    Psoriatic arthritis (HCC)    Rheumatoid arthritis (HCC) 08/07/2014   Sleep apnea    CPAP   Thyroid disease     Allergies  Allergen Reactions   Codeine     Tension, nasty feeling    Nsaids Other (See Comments)    Renal insufficiency   Prednisone Other (See Comments)    Unknown/ sometimes takes with lower dose   Statins Other (See Comments)    Joint pain    Sulfa Antibiotics Other (See Comments)    Joint pain     OBJECTIVE General Patient is awake, alert, and oriented x 3 and in no acute distress. Derm Skin is dry and supple bilateral. Negative open lesions or macerations. Remaining integument unremarkable. Nails are tender, long, thickened and dystrophic with subungual debris, consistent with onychomycosis, 1-5 bilateral. No signs of infection noted.  There are some hyperkeratotic tissue to the plantar aspect of the bilateral forefoot secondary to pressure Vasc  DP and PT pedal pulses palpable bilaterally. Temperature gradient within normal limits.  Neuro grossly intact via light touch Musculoskeletal Exam high arches noted with hammertoe deformity creating retrograde pressure to the bilateral forefoot  ASSESSMENT 1.  Pain due to onychomycosis of toenails both 2.  Symptomatic skin lesions bilateral plantar forefoot 3.  High arches  bilateral with hammertoe deformity; metatarsalgia  PLAN OF CARE -Patient evaluated today.  -Instructed to maintain good pedal hygiene and foot care.  -Mechanical debridement of nails 1-5 bilaterally performed using a nail nipper. Filed with dremel without incident.  -Excisional debridement of the hyperkeratotic tissue was performed using a 312 scalpel.  Salicylic acid and a light dressing applied. -Advised against going barefoot.  Recommend good arch supports to offload pressure from the forefoot -Return to clinic as needed   Felecia Shelling, DPM [facility]  Dr. Felecia Shelling, DPM    [address] [place], [place] [address]                Office [phone]  Fax [phone]

## 2023-09-07 DIAGNOSIS — I781 Nevus, non-neoplastic: Secondary | ICD-10-CM | POA: Diagnosis not present

## 2023-09-07 DIAGNOSIS — D225 Melanocytic nevi of trunk: Secondary | ICD-10-CM | POA: Diagnosis not present

## 2023-09-07 DIAGNOSIS — L820 Inflamed seborrheic keratosis: Secondary | ICD-10-CM | POA: Diagnosis not present

## 2023-09-07 DIAGNOSIS — Z129 Encounter for screening for malignant neoplasm, site unspecified: Secondary | ICD-10-CM | POA: Diagnosis not present

## 2023-09-07 DIAGNOSIS — L817 Pigmented purpuric dermatosis: Secondary | ICD-10-CM | POA: Diagnosis not present

## 2023-09-07 DIAGNOSIS — L218 Other seborrheic dermatitis: Secondary | ICD-10-CM | POA: Diagnosis not present

## 2023-09-07 DIAGNOSIS — L720 Epidermal cyst: Secondary | ICD-10-CM | POA: Diagnosis not present

## 2023-09-07 DIAGNOSIS — Z85828 Personal history of other malignant neoplasm of skin: Secondary | ICD-10-CM | POA: Diagnosis not present

## 2023-09-08 ENCOUNTER — Ambulatory Visit: Payer: Medicare Other | Admitting: Internal Medicine

## 2023-09-09 ENCOUNTER — Ambulatory Visit (INDEPENDENT_AMBULATORY_CARE_PROVIDER_SITE_OTHER): Payer: Medicare Other

## 2023-09-09 DIAGNOSIS — I495 Sick sinus syndrome: Secondary | ICD-10-CM

## 2023-09-09 LAB — CUP PACEART REMOTE DEVICE CHECK
Battery Remaining Longevity: 117 mo
Battery Voltage: 3 V
Brady Statistic AP VP Percent: 10.78 %
Brady Statistic AP VS Percent: 64.53 %
Brady Statistic AS VP Percent: 0.19 %
Brady Statistic AS VS Percent: 24.49 %
Brady Statistic RA Percent Paced: 75.1 %
Brady Statistic RV Percent Paced: 10.98 %
Date Time Interrogation Session: 20241002194224
Implantable Lead Connection Status: 753985
Implantable Lead Connection Status: 753985
Implantable Lead Implant Date: 20210106
Implantable Lead Implant Date: 20210106
Implantable Lead Location: 753859
Implantable Lead Location: 753860
Implantable Lead Model: 3830
Implantable Lead Model: 5076
Implantable Pulse Generator Implant Date: 20210106
Lead Channel Impedance Value: 342 Ohm
Lead Channel Impedance Value: 361 Ohm
Lead Channel Impedance Value: 418 Ohm
Lead Channel Impedance Value: 532 Ohm
Lead Channel Pacing Threshold Amplitude: 0.625 V
Lead Channel Pacing Threshold Amplitude: 0.625 V
Lead Channel Pacing Threshold Pulse Width: 0.4 ms
Lead Channel Pacing Threshold Pulse Width: 0.4 ms
Lead Channel Sensing Intrinsic Amplitude: 1.375 mV
Lead Channel Sensing Intrinsic Amplitude: 1.375 mV
Lead Channel Sensing Intrinsic Amplitude: 23.5 mV
Lead Channel Sensing Intrinsic Amplitude: 23.5 mV
Lead Channel Setting Pacing Amplitude: 1.5 V
Lead Channel Setting Pacing Amplitude: 2.5 V
Lead Channel Setting Pacing Pulse Width: 0.4 ms
Lead Channel Setting Sensing Sensitivity: 2 mV
Zone Setting Status: 755011
Zone Setting Status: 755011

## 2023-09-10 ENCOUNTER — Ambulatory Visit (INDEPENDENT_AMBULATORY_CARE_PROVIDER_SITE_OTHER): Payer: Medicare Other | Admitting: Family

## 2023-09-10 VITALS — BP 138/70 | HR 60 | Temp 98.8°F | Resp 16 | Wt 216.0 lb

## 2023-09-10 DIAGNOSIS — T162XXA Foreign body in left ear, initial encounter: Secondary | ICD-10-CM | POA: Diagnosis not present

## 2023-09-10 DIAGNOSIS — H6123 Impacted cerumen, bilateral: Secondary | ICD-10-CM | POA: Insufficient documentation

## 2023-09-10 NOTE — Progress Notes (Signed)
Subjective:     Patient ID: Martin Rubio, male    DOB: 06-May-1946, 77 y.o.   MRN: [id number]  Chief Complaint  Patient presents with   Ear Fullness    Here for ear fullness     Ear Fullness     Discussed the use of AI scribe software for clinical note transcription with the patient, who gave verbal consent to proceed.  History of Present Illness   The patient, with a history of ear wax impaction, presents after a recent visit to the [place] where he was told he has 'two little hole, a little hole in, through the wax in each ear that I'm hearing through.' He reports no other symptoms or issues.      BP Readings from Last 3 Encounters:  09/10/23 138/70  09/01/23 132/84  06/23/23 126/72     There are no preventive care reminders to display for this patient.  Past Medical History:  Diagnosis Date   Bronchitis    Diverticulosis    FH: BPH (benign prostatic hypertrophy)    History of Graves' disease 01/19/2018   S/p ablation   HTN (hypertension)    Hyperlipidemia    Obesity    Psoriatic arthritis (HCC)    Rheumatoid arthritis (HCC) 08/07/2014   Sleep apnea    CPAP   Thyroid disease     Past Surgical History:  Procedure Laterality Date   ABDOMINAL AORTOGRAM W/LOWER EXTREMITY N/A 01/14/2022   Procedure: ABDOMINAL AORTOGRAM W/LOWER EXTREMITY;  Surgeon: Iran Ouch, MD;  Location: [facility];  Service: Cardiovascular;  Laterality: N/A;   ATRIAL FIBRILLATION ABLATION N/A 12/24/2022   Procedure: ATRIAL FIBRILLATION ABLATION;  Surgeon: Maurice Small, MD;  Location: [facility];  Service: Cardiovascular;  Laterality: N/A;   INGUINAL HERNIA REPAIR     bilateral   KNEE SURGERY     bilateral arthroscopic   l foot surgery Left 12/2020   PACEMAKER IMPLANT N/A 12/13/2019   Procedure: PACEMAKER IMPLANT;  Surgeon: Marinus Maw, MD;  Location: [facility];  Service: Cardiovascular;  Laterality: N/A;   TRANSURETHRAL RESECTION OF PROSTATE      UMBILICAL HERNIA REPAIR      Family History  Problem Relation Age of Onset   CAD Mother 42       CABG   CAD Father 31       Died age 30   Diabetes Brother    Lung cancer Maternal Grandmother    Prostate cancer Neg Hx    Colon cancer Neg Hx     Social History   Socioeconomic History   Marital status: Married    Spouse name: Not on file   Number of children: 2   Years of education: Not on file   Highest education level: Not on file  Occupational History   Occupation: Retired    Associate Professor: [employer]  Tobacco Use   Smoking status: Former    Types: Cigarettes   Smokeless tobacco: Never   Tobacco comments:    Former smoker Quit 46 years ago. 01/21/23  Vaping Use   Vaping status: Never Used  Substance and Sexual Activity   Alcohol use: No   Drug use: No   Sexual activity: Not on file  Other Topics Concern   Not on file  Social History Narrative   Lives with wife.   Social Determinants of Health   Financial Resource Strain: Low Risk  (01/26/2022)   Overall Physicist, medical Strain (  CARDIA)    Difficulty of Paying Living Expenses: Not hard at all  Food Insecurity: No Food Insecurity (01/26/2022)   Hunger Vital Sign    Worried About Running Out of Food in the Last Year: Never true    Ran Out of Food in the Last Year: Never true  Transportation Needs: No Transportation Needs (01/26/2022)   PRAPARE - Administrator, Civil Service (Medical): No    Lack of Transportation (Non-Medical): No  Physical Activity: Inactive (01/26/2022)   Exercise Vital Sign    Days of Exercise per Week: 0 days    Minutes of Exercise per Session: 0 min  Stress: No Stress Concern Present (01/26/2022)   [employer] of Occupational Health - Occupational Stress Questionnaire    Feeling of Stress : Not at all  Social Connections: Moderately Integrated (01/26/2022)   Social Connection and Isolation Panel [NHANES]    Frequency of Communication with Friends and Family: Three times a  week    Frequency of Social Gatherings with Friends and Family: Twice a week    Attends Religious Services: More than 4 times per year    Active Member of [employer] or Organizations: No    Attends Banker Meetings: Never    Marital Status: Married  Catering manager Violence: Not At Risk (01/26/2022)   Humiliation, Afraid, Rape, and Kick questionnaire    Fear of Current or Ex-Partner: No    Emotionally Abused: No    Physically Abused: No    Sexually Abused: No    Outpatient Medications Prior to Visit  Medication Sig Dispense Refill   acetaminophen (TYLENOL) 500 MG tablet Take 500 mg by mouth every 6 (six) hours as needed (pain.).     amLODipine (NORVASC) 5 MG tablet Take 1 tablet (5 mg total) by mouth daily. 90 tablet 3   cholecalciferol (VITAMIN D3) 25 MCG (1000 UNIT) tablet Take 2,000 Units by mouth daily in the afternoon.     Cyanocobalamin (VITAMIN B 12) 500 MCG TABS Take 1,000 mcg by mouth daily in the afternoon.     diclofenac Sodium (VOLTAREN) 1 % GEL Apply 4 g topically in the morning and at bedtime. (Patient taking differently: Apply 4 g topically 2 (two) times daily as needed (knee pain.).) 100 g 6   dofetilide (TIKOSYN) 250 MCG capsule TAKE ONE CAPSULE BY MOUTH TWICE A DAY 180 capsule 3   ENBREL SURECLICK 50 MG/ML injection Inject 50 mg into the skin once a week. Friday or Saturday     Evolocumab (REPATHA SURECLICK) 140 MG/ML SOAJ Inject 140 mg into the skin every 14 (fourteen) days. 2 mL 11   ferrous fumarate (FERRETTS) 325 (106 Fe) MG TABS tablet Take 1 tablet (106 mg of iron total) by mouth 2 (two) times daily. 180 tablet 1   fluticasone (FLONASE) 50 MCG/ACT nasal spray Place 1 spray into both nostrils as needed for allergies.     hydrOXYzine (VISTARIL) 25 MG capsule Take 1 capsule (25 mg total) by mouth every 8 (eight) hours as needed. 60 capsule 3   Krill Oil 350 MG CAPS Take 350 mg by mouth every Monday, Tuesday, Wednesday, Thursday, and Friday.     levothyroxine  (SYNTHROID) 137 MCG tablet Take 1 tablet (137 mcg total) by mouth daily before breakfast. 90 tablet 1   losartan (COZAAR) 50 MG tablet TAKE ONE TABLET BY MOUTH EVERY MORNING AND TAKE ONE TABLET BY MOUTH AT BEDTIME 180 tablet 2   meclizine (ANTIVERT) 25 MG tablet Take  25 mg by mouth 3 (three) times daily as needed for dizziness.     Melatonin 5 MG TABS Take 5 mg by mouth at bedtime.     methocarbamol (ROBAXIN) 500 MG tablet Take 1 tablet (500 mg total) by mouth every 8 (eight) hours as needed for muscle spasms. 30 tablet 1   OVER THE COUNTER MEDICATION Take 2 capsules by mouth daily in the afternoon. Nervprin - Healthy Nerve Support - Peripheral Neuropathy Relief     oxybutynin (DITROPAN-XL) 10 MG 24 hr tablet Take 10 mg by mouth at bedtime.     pantoprazole (PROTONIX) 40 MG tablet Take 1 tablet (40 mg total) by mouth daily. 90 tablet 1   pregabalin (LYRICA) 50 MG capsule Take 50 mg by mouth at bedtime.     rivaroxaban (XARELTO) 20 MG TABS tablet Take 1 tablet (20 mg total) by mouth daily with supper. Dose change, please call [phone] 90 tablet 1   tadalafil (CIALIS) 20 MG tablet Take 20 mg by mouth daily as needed for erectile dysfunction.     tamsulosin (FLOMAX) 0.4 MG CAPS capsule Take 0.4 mg by mouth at bedtime.     triamcinolone cream (KENALOG) 0.1 % Apply 1 application topically daily as needed for itching.     No facility-administered medications prior to visit.    Allergies  Allergen Reactions   Codeine     Tension, nasty feeling    Nsaids Other (See Comments)    Renal insufficiency   Prednisone Other (See Comments)    Unknown/ sometimes takes with lower dose   Statins Other (See Comments)    Joint pain    Sulfa Antibiotics Other (See Comments)    Joint pain    ROS    See HPI Objective:    Physical Exam Constitutional:      Appearance: Normal appearance.  HENT:     Head: Normocephalic and atraumatic.     Comments: After removal of most of cerumen, a clear plastic  piece was noted in ear canal.  Removed with tweezers by provider.     Right Ear: There is impacted cerumen (not complete impaction).     Left Ear: There is impacted cerumen.  Cardiovascular:     Rate and Rhythm: Normal rate.  Pulmonary:     Effort: Pulmonary effort is normal.  Neurological:     Mental Status: He is alert and oriented to person, place, and time. Mental status is at baseline.  Psychiatric:        Mood and Affect: Mood normal.        Behavior: Behavior normal.        Thought Content: Thought content normal.        Judgment: Judgment normal.      BP 138/70   Pulse 60   Temp 98.8 F (37.1 C) (Oral)   Resp 16   Wt 216 lb (98 kg)   SpO2 98%   BMI 30.99 kg/m  Wt Readings from Last 3 Encounters:  09/10/23 216 lb (98 kg)  09/01/23 215 lb (97.5 kg)  06/23/23 214 lb 9.6 oz (97.3 kg)       Assessment & Plan:   Problem List Items Addressed This Visit       Unprioritized   Foreign body of left ear    New. Clear piece of plastic noted in ear canal, removed by under direct visualization with otoscope by provider using tweezers. Pt tolerated procedure well. Normal TM's noted bilaterally following removal.  Excessive cerumen in both ear canals - Primary    New.  Ceruminosis is noted.  Wax is removed by CMA with syringing. Patient tolerated procedure well.  Instructions for home care to prevent wax buildup are given.        I am having Tilman Neat. Marty Heck" maintain his fluticasone, tamsulosin, triamcinolone cream, melatonin, cholecalciferol, Vitamin B 12, meclizine, diclofenac Sodium, Krill Oil, pregabalin, dofetilide, tadalafil, Enbrel SureClick, oxybutynin, OVER THE COUNTER MEDICATION, acetaminophen, amLODipine, Repatha SureClick, methocarbamol, pantoprazole, rivaroxaban, losartan, hydrOXYzine, Ferretts, and levothyroxine.  No orders of the defined types were placed in this encounter.

## 2023-09-10 NOTE — Assessment & Plan Note (Addendum)
New. Clear piece of plastic noted in ear canal, removed by under direct visualization with otoscope by provider using tweezers. Pt tolerated procedure well. Normal TM's noted bilaterally following removal.

## 2023-09-10 NOTE — Assessment & Plan Note (Signed)
New.  Ceruminosis is noted.  Wax is removed by CMA with syringing. Patient tolerated procedure well.  Instructions for home care to prevent wax buildup are given.

## 2023-09-10 NOTE — Patient Instructions (Signed)
VISIT SUMMARY:  During your visit, we discussed your concerns about ear wax build-up and its impact on your hearing. We also noted an initial high blood pressure reading, which normalized upon re-checking. We discussed the importance of vaccinations, but you declined the flu shot and expressed hesitation about receiving further doses of the COVID-19 vaccine due to personal beliefs.  YOUR PLAN:  -EAR WAX BUILD-UP: This is a common condition where wax accumulates in your ear and can affect your hearing. We performed an ear irrigation to remove the wax and will re-examine your ears to ensure they are clear.  -HIGH BLOOD PRESSURE: This is a condition where the force of the blood against your artery walls is too high. It was initially elevated but normalized upon re-checking. We will continue to monitor your blood pressure.  -INFLUENZA VACCINATION: The flu shot is a vaccine given with a needle, usually in the arm, that protects against the influenza virus. You declined to receive this vaccination due to personal beliefs.  -COVID-19 VACCINATION: This is a vaccine to protect against the COVID-19 virus. We discussed updating your covid booster. You have received one dose but expressed hesitation about receiving further doses due to personal beliefs.  INSTRUCTIONS:  Please continue to monitor your blood pressure at home. If you notice any changes in your hearing following the ear irrigation, please contact our office. While we respect your decision regarding vaccinations, we encourage you to consider the benefits of receiving the flu shot and completing your COVID-19 vaccination series.

## 2023-09-11 NOTE — Progress Notes (Unsigned)
Cardiology Office Note:   Date:  09/13/2023  ID:  Martin Rubio, DOB 1946/05/11, MRN [id number] PCP: Wanda Plump, MD  [facility] Providers Cardiologist:  Rollene Rotunda, MD Electrophysiologist:  Maurice Small, MD {  History of Present Illness:   Martin Rubio is a 77 y.o. male presents for follow up of HTN.  His echo in Nov of 2016 demonstrated no septal hypertrophy.  He has had atrial fib/flutter paroxysmally with slow ventricular rates.  He had pacemaker placement.   He continued to have PAF and was started on Tikosyn.   Angiography showed small to medium size aortic aneurysm above the iliac bifurcation, severe occlusive disease affecting bilateral common iliac artery worse on the left side with large sized aneurysm on the left side and medium size on the right side with very tortuous iliac arteries.  Endovascular options were limited .  The patient was seen by Dr. Karin Lieu for consultation.  Given that his claudication was not severe and aneurysm size was below the threshold for repair, it was elected to continue medical therapy and observation for now.     Since I last saw him he has seen EP.   He had atrial fibrillation in 2024.  He is status post pacemaker placement 2021 for heart block.  From a cardiac standpoint he had no problems.  He did see Dr. Nelly Laurence and it was suggested that he might want to come off his Tikosyn but it was left up to him.  The patient is actually done quite well from a cardiovascular standpoint.  He does not feel fibrillation.  He does not have any presyncope or syncope.  He is not having any chest pressure, neck or arm discomfort.  He tries to go to the gym.  He has been most bothered by some joint pains in his right foot pain and is going to see his orthopedist.  His biggest issue has been ongoing nausea.  He has a lot of this with excessive saliva production at times.  He says it is related to " ulcers in his stomach."  As he was told this years ago by  gastroenterologist although he has not actively seen a gastroenterologist.  ROS: As stated in the HPI and negative for all other systems.  Studies Reviewed:    EKG:   EKG Interpretation Date/Time:  Monday September 13 2023 10:39:21 EDT Ventricular Rate:  60 PR Interval:  274 QRS Duration:  94 QT Interval:  428 QTC Calculation: 428 R Axis:   -31  Text Interpretation: Atrial-paced rhythm with prolonged AV conduction Left axis deviation Poor anterior R wave progression Left ventricular hypertrophy ( R in aVL , Cornell product , Romhilt-Estes ) Non-specific ST-t changes No significant change since last tracing Confirmed by Rollene Rotunda ([id number]) on 09/13/2023 10:55:27 AM    Risk Assessment/Calculations:    CHA2DS2-VASc Score = 3   This indicates a 3.2% annual risk of stroke. The patient's score is based upon: CHF History: 0 HTN History: 1 Diabetes History: 0 Stroke History: 0 Vascular Disease History: 0 Age Score: 2 Gender Score: 0    Physical Exam:   VS:  BP 118/60 (BP Location: Left Arm, Patient Position: Sitting, Cuff Size: Normal)   Pulse 60   Ht 5\' 10"  (1.778 m)   Wt 215 lb 12.8 oz (97.9 kg)   SpO2 95%   BMI 30.96 kg/m    Wt Readings from Last 3 Encounters:  09/13/23 215 lb 12.8 oz (97.9 kg)  09/10/23 216 lb (98 kg)  09/01/23 215 lb (97.5 kg)     GEN: Well nourished, well developed in no acute distress NECK: No JVD; No carotid bruits CARDIAC: RRR, no murmurs, rubs, gallops RESPIRATORY:  Clear to auscultation without rales, wheezing or rhonchi  ABDOMEN: Soft, non-tender, non-distended EXTREMITIES:  No edema; No deformity   ASSESSMENT AND PLAN:   Paroxysmal Atrial Fibrillation : I have suggested that he stay on the Tikosyn because previously he had such problems with this rhythm.  Even though this might have been managed successfully with ablation alone he is not having any trouble with the Tikosyn and tolerates it.  He will remain on this and the meds as listed.   He tolerates anticoagulation.    Secondary Hypercoagulable State:    Continue current therapy.    HTN:    The blood pressure is at target.  No change in therapy.   Obstructive sleep apnea: Compliance with sleep apnea has been encouraged.   CHB: He is up-to-date with follow-up.   CAD: He has no anginal send continue with risk reduction.  CKD: Creatinine is 1.67 and he is followed by nephrology.  Nausea: This has been a big issue and so I am going to get him to see a gastroenterologist.         Follow up with me in one year.   Signed, Rollene Rotunda, MD

## 2023-09-13 ENCOUNTER — Encounter: Payer: Self-pay | Admitting: Cardiology

## 2023-09-13 ENCOUNTER — Ambulatory Visit: Payer: Medicare Other | Attending: Cardiology | Admitting: Cardiology

## 2023-09-13 VITALS — BP 118/60 | HR 60 | Ht 70.0 in | Wt 215.8 lb

## 2023-09-13 DIAGNOSIS — I2510 Atherosclerotic heart disease of native coronary artery without angina pectoris: Secondary | ICD-10-CM | POA: Insufficient documentation

## 2023-09-13 DIAGNOSIS — I442 Atrioventricular block, complete: Secondary | ICD-10-CM | POA: Insufficient documentation

## 2023-09-13 DIAGNOSIS — I480 Paroxysmal atrial fibrillation: Secondary | ICD-10-CM | POA: Insufficient documentation

## 2023-09-13 DIAGNOSIS — R110 Nausea: Secondary | ICD-10-CM | POA: Insufficient documentation

## 2023-09-13 DIAGNOSIS — I10 Essential (primary) hypertension: Secondary | ICD-10-CM | POA: Diagnosis not present

## 2023-09-13 NOTE — Patient Instructions (Signed)

## 2023-09-14 ENCOUNTER — Telehealth: Payer: Self-pay | Admitting: Gastroenterology

## 2023-09-14 DIAGNOSIS — G6289 Other specified polyneuropathies: Secondary | ICD-10-CM | POA: Diagnosis not present

## 2023-09-14 DIAGNOSIS — M25561 Pain in right knee: Secondary | ICD-10-CM | POA: Diagnosis not present

## 2023-09-14 DIAGNOSIS — M1991 Primary osteoarthritis, unspecified site: Secondary | ICD-10-CM | POA: Diagnosis not present

## 2023-09-14 DIAGNOSIS — M5136 Other intervertebral disc degeneration, lumbar region with discogenic back pain only: Secondary | ICD-10-CM | POA: Diagnosis not present

## 2023-09-14 DIAGNOSIS — L409 Psoriasis, unspecified: Secondary | ICD-10-CM | POA: Diagnosis not present

## 2023-09-14 DIAGNOSIS — M79671 Pain in right foot: Secondary | ICD-10-CM | POA: Diagnosis not present

## 2023-09-14 DIAGNOSIS — N1832 Chronic kidney disease, stage 3b: Secondary | ICD-10-CM | POA: Diagnosis not present

## 2023-09-14 DIAGNOSIS — E669 Obesity, unspecified: Secondary | ICD-10-CM | POA: Diagnosis not present

## 2023-09-14 DIAGNOSIS — M65331 Trigger finger, right middle finger: Secondary | ICD-10-CM | POA: Diagnosis not present

## 2023-09-14 DIAGNOSIS — L4050 Arthropathic psoriasis, unspecified: Secondary | ICD-10-CM | POA: Diagnosis not present

## 2023-09-14 DIAGNOSIS — Z6830 Body mass index (BMI) 30.0-30.9, adult: Secondary | ICD-10-CM | POA: Diagnosis not present

## 2023-09-14 DIAGNOSIS — M25562 Pain in left knee: Secondary | ICD-10-CM | POA: Diagnosis not present

## 2023-09-14 LAB — BASIC METABOLIC PANEL
BUN: 18 (ref 4–21)
CO2: 22 (ref 13–22)
Chloride: 102 (ref 99–108)
Creatinine: 1.7 — AB (ref 0.6–1.3)
Glucose: 76
Potassium: 6 meq/L — AB (ref 3.5–5.1)
Sodium: 144 (ref 137–147)

## 2023-09-14 LAB — CBC AND DIFFERENTIAL
HCT: 52 (ref 41–53)
Hemoglobin: 16.2 (ref 13.5–17.5)
Neutrophils Absolute: 4.5
Platelets: 171 10*3/uL (ref 150–400)
WBC: 7.2

## 2023-09-14 LAB — COMPREHENSIVE METABOLIC PANEL
Albumin: 4.7 (ref 3.5–5.0)
Calcium: 9.7 (ref 8.7–10.7)
Globulin: 2.2
eGFR: 41

## 2023-09-14 LAB — CBC: RBC: 5.82 — AB (ref 3.87–5.11)

## 2023-09-14 LAB — HEPATIC FUNCTION PANEL
ALT: 21 U/L (ref 10–40)
AST: 27 (ref 14–40)
Alkaline Phosphatase: 81 (ref 25–125)
Bilirubin, Total: 0.6

## 2023-09-14 NOTE — Telephone Encounter (Signed)
Good afternoon Dr. Barron Alvine  The following patient is being referred to [place] for nausea and is requesting you to continue care. He has previous GI history with [facility] but no longer wishes to continue with them because he feels the doctors there do not care about his needs and unprofessional. Records are available with Care Everywhere. Please review and advise of scheduling. Thank you. `

## 2023-09-16 ENCOUNTER — Encounter: Payer: Self-pay | Admitting: Internal Medicine

## 2023-09-16 ENCOUNTER — Other Ambulatory Visit: Payer: Self-pay

## 2023-09-16 ENCOUNTER — Encounter: Payer: Self-pay | Admitting: Gastroenterology

## 2023-09-16 ENCOUNTER — Encounter (HOSPITAL_BASED_OUTPATIENT_CLINIC_OR_DEPARTMENT_OTHER): Payer: Self-pay | Admitting: Emergency Medicine

## 2023-09-16 ENCOUNTER — Emergency Department (HOSPITAL_BASED_OUTPATIENT_CLINIC_OR_DEPARTMENT_OTHER): Payer: Medicare Other

## 2023-09-16 ENCOUNTER — Inpatient Hospital Stay (HOSPITAL_BASED_OUTPATIENT_CLINIC_OR_DEPARTMENT_OTHER)
Admission: EM | Admit: 2023-09-16 | Discharge: 2023-09-19 | DRG: 418 | Disposition: A | Discharge status: Home / Self Care | Payer: Medicare Other | Attending: Family Medicine | Admitting: Family Medicine

## 2023-09-16 DIAGNOSIS — K8066 Calculus of gallbladder and bile duct with acute and chronic cholecystitis without obstruction: Secondary | ICD-10-CM | POA: Diagnosis not present

## 2023-09-16 DIAGNOSIS — E785 Hyperlipidemia, unspecified: Secondary | ICD-10-CM | POA: Diagnosis present

## 2023-09-16 DIAGNOSIS — E039 Hypothyroidism, unspecified: Secondary | ICD-10-CM | POA: Diagnosis present

## 2023-09-16 DIAGNOSIS — Z8249 Family history of ischemic heart disease and other diseases of the circulatory system: Secondary | ICD-10-CM

## 2023-09-16 DIAGNOSIS — N1832 Chronic kidney disease, stage 3b: Secondary | ICD-10-CM | POA: Diagnosis present

## 2023-09-16 DIAGNOSIS — L4050 Arthropathic psoriasis, unspecified: Secondary | ICD-10-CM | POA: Diagnosis present

## 2023-09-16 DIAGNOSIS — Z79899 Other long term (current) drug therapy: Secondary | ICD-10-CM | POA: Diagnosis not present

## 2023-09-16 DIAGNOSIS — K859 Acute pancreatitis without necrosis or infection, unspecified: Secondary | ICD-10-CM | POA: Diagnosis present

## 2023-09-16 DIAGNOSIS — Z87891 Personal history of nicotine dependence: Secondary | ICD-10-CM

## 2023-09-16 DIAGNOSIS — G4733 Obstructive sleep apnea (adult) (pediatric): Secondary | ICD-10-CM | POA: Diagnosis present

## 2023-09-16 DIAGNOSIS — Z79890 Hormone replacement therapy (postmenopausal): Secondary | ICD-10-CM

## 2023-09-16 DIAGNOSIS — G4700 Insomnia, unspecified: Secondary | ICD-10-CM | POA: Diagnosis not present

## 2023-09-16 DIAGNOSIS — I7143 Infrarenal abdominal aortic aneurysm, without rupture: Secondary | ICD-10-CM | POA: Diagnosis not present

## 2023-09-16 DIAGNOSIS — M069 Rheumatoid arthritis, unspecified: Secondary | ICD-10-CM | POA: Diagnosis present

## 2023-09-16 DIAGNOSIS — K851 Biliary acute pancreatitis without necrosis or infection: Principal | ICD-10-CM | POA: Diagnosis present

## 2023-09-16 DIAGNOSIS — I739 Peripheral vascular disease, unspecified: Secondary | ICD-10-CM | POA: Diagnosis present

## 2023-09-16 DIAGNOSIS — Z8711 Personal history of peptic ulcer disease: Secondary | ICD-10-CM

## 2023-09-16 DIAGNOSIS — Z882 Allergy status to sulfonamides status: Secondary | ICD-10-CM

## 2023-09-16 DIAGNOSIS — N400 Enlarged prostate without lower urinary tract symptoms: Secondary | ICD-10-CM | POA: Diagnosis present

## 2023-09-16 DIAGNOSIS — K8012 Calculus of gallbladder with acute and chronic cholecystitis without obstruction: Secondary | ICD-10-CM | POA: Diagnosis not present

## 2023-09-16 DIAGNOSIS — I442 Atrioventricular block, complete: Secondary | ICD-10-CM | POA: Diagnosis present

## 2023-09-16 DIAGNOSIS — Z9079 Acquired absence of other genital organ(s): Secondary | ICD-10-CM

## 2023-09-16 DIAGNOSIS — N138 Other obstructive and reflux uropathy: Secondary | ICD-10-CM | POA: Diagnosis present

## 2023-09-16 DIAGNOSIS — I495 Sick sinus syndrome: Secondary | ICD-10-CM | POA: Diagnosis present

## 2023-09-16 DIAGNOSIS — E66811 Obesity, class 1: Secondary | ICD-10-CM | POA: Diagnosis present

## 2023-09-16 DIAGNOSIS — I480 Paroxysmal atrial fibrillation: Secondary | ICD-10-CM | POA: Diagnosis present

## 2023-09-16 DIAGNOSIS — I10 Essential (primary) hypertension: Secondary | ICD-10-CM | POA: Diagnosis present

## 2023-09-16 DIAGNOSIS — Z7901 Long term (current) use of anticoagulants: Secondary | ICD-10-CM

## 2023-09-16 DIAGNOSIS — R7401 Elevation of levels of liver transaminase levels: Secondary | ICD-10-CM | POA: Diagnosis present

## 2023-09-16 DIAGNOSIS — K219 Gastro-esophageal reflux disease without esophagitis: Secondary | ICD-10-CM | POA: Diagnosis present

## 2023-09-16 DIAGNOSIS — I700 Atherosclerosis of aorta: Secondary | ICD-10-CM | POA: Diagnosis present

## 2023-09-16 DIAGNOSIS — R1013 Epigastric pain: Secondary | ICD-10-CM | POA: Diagnosis not present

## 2023-09-16 DIAGNOSIS — Z885 Allergy status to narcotic agent status: Secondary | ICD-10-CM

## 2023-09-16 DIAGNOSIS — I4891 Unspecified atrial fibrillation: Secondary | ICD-10-CM | POA: Diagnosis not present

## 2023-09-16 DIAGNOSIS — Z886 Allergy status to analgesic agent status: Secondary | ICD-10-CM

## 2023-09-16 DIAGNOSIS — K5730 Diverticulosis of large intestine without perforation or abscess without bleeding: Secondary | ICD-10-CM | POA: Diagnosis not present

## 2023-09-16 DIAGNOSIS — Z950 Presence of cardiac pacemaker: Secondary | ICD-10-CM

## 2023-09-16 DIAGNOSIS — Z713 Dietary counseling and surveillance: Secondary | ICD-10-CM

## 2023-09-16 DIAGNOSIS — D6869 Other thrombophilia: Secondary | ICD-10-CM | POA: Diagnosis present

## 2023-09-16 DIAGNOSIS — I723 Aneurysm of iliac artery: Secondary | ICD-10-CM | POA: Diagnosis not present

## 2023-09-16 DIAGNOSIS — Z01810 Encounter for preprocedural cardiovascular examination: Secondary | ICD-10-CM | POA: Diagnosis not present

## 2023-09-16 DIAGNOSIS — I129 Hypertensive chronic kidney disease with stage 1 through stage 4 chronic kidney disease, or unspecified chronic kidney disease: Secondary | ICD-10-CM | POA: Diagnosis present

## 2023-09-16 DIAGNOSIS — Z888 Allergy status to other drugs, medicaments and biological substances status: Secondary | ICD-10-CM

## 2023-09-16 DIAGNOSIS — K810 Acute cholecystitis: Secondary | ICD-10-CM | POA: Diagnosis not present

## 2023-09-16 DIAGNOSIS — E669 Obesity, unspecified: Secondary | ICD-10-CM | POA: Insufficient documentation

## 2023-09-16 DIAGNOSIS — K8020 Calculus of gallbladder without cholecystitis without obstruction: Secondary | ICD-10-CM | POA: Diagnosis not present

## 2023-09-16 DIAGNOSIS — Z6830 Body mass index (BMI) 30.0-30.9, adult: Secondary | ICD-10-CM

## 2023-09-16 LAB — CBC
HCT: 48.1 % (ref 39.0–52.0)
Hemoglobin: 16.2 g/dL (ref 13.0–17.0)
MCH: 28.4 pg (ref 26.0–34.0)
MCHC: 33.7 g/dL (ref 30.0–36.0)
MCV: 84.4 fL (ref 80.0–100.0)
Platelets: 141 10*3/uL — ABNORMAL LOW (ref 150–400)
RBC: 5.7 MIL/uL (ref 4.22–5.81)
RDW: 13.8 % (ref 11.5–15.5)
WBC: 6.9 10*3/uL (ref 4.0–10.5)
nRBC: 0 % (ref 0.0–0.2)

## 2023-09-16 LAB — COMPREHENSIVE METABOLIC PANEL
ALT: 121 U/L — ABNORMAL HIGH (ref 0–44)
AST: 189 U/L — ABNORMAL HIGH (ref 15–41)
Albumin: 3.8 g/dL (ref 3.5–5.0)
Alkaline Phosphatase: 92 U/L (ref 38–126)
Anion gap: 11 (ref 5–15)
BUN: 20 mg/dL (ref 8–23)
CO2: 24 mmol/L (ref 22–32)
Calcium: 9.4 mg/dL (ref 8.9–10.3)
Chloride: 105 mmol/L (ref 98–111)
Creatinine, Ser: 1.57 mg/dL — ABNORMAL HIGH (ref 0.61–1.24)
GFR, Estimated: 45 mL/min — ABNORMAL LOW (ref >60)
Glucose, Bld: 103 mg/dL — ABNORMAL HIGH (ref 70–99)
Potassium: 4 mmol/L (ref 3.5–5.1)
Sodium: 140 mmol/L (ref 135–145)
Total Bilirubin: 1.2 mg/dL (ref 0.3–1.2)
Total Protein: 6.5 g/dL (ref 6.5–8.1)

## 2023-09-16 LAB — LIPASE, BLOOD: Lipase: 3853 U/L — ABNORMAL HIGH (ref 11–51)

## 2023-09-16 LAB — TROPONIN I (HIGH SENSITIVITY): Troponin I (High Sensitivity): 17 ng/L (ref <18)

## 2023-09-16 MED ORDER — LOSARTAN POTASSIUM 50 MG PO TABS
50.0000 mg | ORAL_TABLET | Freq: Every day | ORAL | Status: DC
  Administered 2023-09-16 – 2023-09-18 (×3): 50 mg via ORAL
  Filled 2023-09-16: qty 1
  Filled 2023-09-16: qty 2
  Filled 2023-09-16 (×2): qty 1

## 2023-09-16 MED ORDER — MORPHINE SULFATE (PF) 4 MG/ML IV SOLN
4.0000 mg | Freq: Once | INTRAVENOUS | Status: AC
  Administered 2023-09-16: 4 mg via INTRAVENOUS
  Filled 2023-09-16: qty 1

## 2023-09-16 MED ORDER — AMLODIPINE BESYLATE 5 MG PO TABS
5.0000 mg | ORAL_TABLET | Freq: Every day | ORAL | Status: DC
  Administered 2023-09-16 – 2023-09-19 (×4): 5 mg via ORAL
  Filled 2023-09-16 (×4): qty 1

## 2023-09-16 MED ORDER — ONDANSETRON HCL 4 MG/2ML IJ SOLN
4.0000 mg | Freq: Once | INTRAMUSCULAR | Status: AC
  Administered 2023-09-16: 4 mg via INTRAVENOUS
  Filled 2023-09-16: qty 2

## 2023-09-16 MED ORDER — ALUM & MAG HYDROXIDE-SIMETH 200-200-20 MG/5ML PO SUSP
30.0000 mL | Freq: Once | ORAL | Status: AC
  Administered 2023-09-16: 30 mL via ORAL
  Filled 2023-09-16: qty 30

## 2023-09-16 MED ORDER — ACETAMINOPHEN 500 MG PO TABS
1000.0000 mg | ORAL_TABLET | Freq: Once | ORAL | Status: AC
  Administered 2023-09-16: 1000 mg via ORAL
  Filled 2023-09-16: qty 2

## 2023-09-16 MED ORDER — DOFETILIDE 250 MCG PO CAPS
250.0000 ug | ORAL_CAPSULE | Freq: Two times a day (BID) | ORAL | Status: DC
  Administered 2023-09-17 – 2023-09-19 (×5): 250 ug via ORAL
  Filled 2023-09-16 (×7): qty 1

## 2023-09-16 MED ORDER — IOHEXOL 300 MG/ML  SOLN
100.0000 mL | Freq: Once | INTRAMUSCULAR | Status: AC | PRN
  Administered 2023-09-16: 100 mL via INTRAVENOUS

## 2023-09-16 NOTE — Telephone Encounter (Signed)
Left patient VM to call and schedule OV.

## 2023-09-16 NOTE — ED Triage Notes (Signed)
Pt to ER with c/o epigastric pain with n/v starting last night.  Pt states hx of gastric ulcers and this pain is similar.

## 2023-09-16 NOTE — ED Provider Notes (Signed)
[place] [facility] AT [facility] Provider Note   CSN: [id number] Arrival date & time: 09/16/23  1729     History  Chief Complaint  Patient presents with   Abdominal Pain    Martin Rubio is a 77 y.o. male.  77 yo M with a chief complaints of epigastric abdominal discomfort.  He noticed this when he laid back to go to sleep last night.  Like when he had had ulcers in the past.  He has been chewing ginger chews had an episode where he vomited them back up.  Otherwise denies any issues eating or drinking.  No fevers.  No obvious exertional symptoms though does not exert himself very often.  Was seen recently by his cardiologist and told that his heart was good.  Denies cough congestion.   Abdominal Pain      Home Medications Prior to Admission medications   Medication Sig Start Date End Date Taking? Authorizing Provider  amLODipine (NORVASC) 5 MG tablet Take 1 tablet (5 mg total) by mouth daily. 03/09/23  Yes Rollene Rotunda, MD  cholecalciferol (VITAMIN D3) 25 MCG (1000 UNIT) tablet Take 2,000 Units by mouth daily in the afternoon.   Yes [provider]  Cyanocobalamin (VITAMIN B 12) 500 MCG TABS Take 1,000 mcg by mouth daily in the afternoon. 02/29/20  Yes Paz, Nolon Rod, MD  dofetilide (TIKOSYN) 250 MCG capsule TAKE ONE CAPSULE BY MOUTH TWICE A DAY 09/25/22  Yes Rollene Rotunda, MD  ferrous fumarate (FERRETTS) 325 (106 Fe) MG TABS tablet Take 1 tablet (106 mg of iron total) by mouth 2 (two) times daily. 08/30/23  Yes Paz, Nolon Rod, MD  acetaminophen (TYLENOL) 500 MG tablet Take 500 mg by mouth every 6 (six) hours as needed (pain.).    [provider]  diclofenac Sodium (VOLTAREN) 1 % GEL Apply 4 g topically in the morning and at bedtime. Patient taking differently: Apply 4 g topically 2 (two) times daily as needed (knee pain.). 12/16/21   Wanda Plump, MD  ENBREL SURECLICK 50 MG/ML injection Inject 50 mg into the skin once a week. Friday or  Saturday 12/21/22   [provider]  Evolocumab (REPATHA SURECLICK) 140 MG/ML SOAJ Inject 140 mg into the skin every 14 (fourteen) days. 03/15/23   Iran Ouch, MD  fluticasone (FLONASE) 50 MCG/ACT nasal spray Place 1 spray into both nostrils as needed for allergies.    [provider]  hydrOXYzine (VISTARIL) 25 MG capsule Take 1 capsule (25 mg total) by mouth every 8 (eight) hours as needed. 08/02/23   Wanda Plump, MD  Krill Oil 350 MG CAPS Take 350 mg by mouth every Monday, Tuesday, Wednesday, Thursday, and Friday. Patient not taking: Reported on 09/13/2023    [provider]  levothyroxine (SYNTHROID) 137 MCG tablet Take 1 tablet (137 mcg total) by mouth daily before breakfast. 08/30/23   Wanda Plump, MD  losartan (COZAAR) 50 MG tablet TAKE ONE TABLET BY MOUTH EVERY MORNING AND TAKE ONE TABLET BY MOUTH AT BEDTIME 06/14/23   Iran Ouch, MD  meclizine (ANTIVERT) 25 MG tablet Take 25 mg by mouth 3 (three) times daily as needed for dizziness. 01/19/18   [provider]  Melatonin 5 MG TABS Take 5 mg by mouth at bedtime.    [provider]  methocarbamol (ROBAXIN) 500 MG tablet Take 1 tablet (500 mg total) by mouth every 8 (eight) hours as needed for muscle spasms. 05/11/23  Lenda Kelp, MD  OVER THE COUNTER MEDICATION Take 2 capsules by mouth daily in the afternoon. Nervprin - Healthy Nerve Support - Peripheral Neuropathy Relief    [provider]  oxybutynin (DITROPAN-XL) 10 MG 24 hr tablet Take 10 mg by mouth at bedtime. 11/04/22   [provider]  pantoprazole (PROTONIX) 40 MG tablet Take 1 tablet (40 mg total) by mouth daily. 06/07/23   Wanda Plump, MD  pregabalin (LYRICA) 50 MG capsule Take 50 mg by mouth at bedtime. 04/17/22   [provider]  rivaroxaban (XARELTO) 20 MG TABS tablet Take 1 tablet (20 mg total) by mouth daily with supper. Dose change, please call [phone] 06/08/23   Mealor, Roberts Gaudy, MD  tadalafil  (CIALIS) 20 MG tablet Take 20 mg by mouth daily as needed for erectile dysfunction. 10/01/22   [provider]  tamsulosin (FLOMAX) 0.4 MG CAPS capsule Take 0.4 mg by mouth at bedtime. 03/01/19   [provider]  triamcinolone cream (KENALOG) 0.1 % Apply 1 application topically daily as needed for itching. 08/22/19   [provider]      Allergies    Codeine, Nsaids, Prednisone, Statins, and Sulfa antibiotics    Review of Systems   Review of Systems  Gastrointestinal:  Positive for abdominal pain.    Physical Exam Updated Vital Signs BP (!) 171/93   Pulse 60   Temp 97.7 F (36.5 C) (Oral)   Resp 20   Ht 5\' 10"  (1.778 m)   Wt 97.5 kg   SpO2 95%   BMI 30.85 kg/m  Physical Exam Vitals and nursing note reviewed.  Constitutional:      Appearance: He is well-developed.  HENT:     Head: Normocephalic and atraumatic.  Eyes:     Pupils: Pupils are equal, round, and reactive to light.  Neck:     Vascular: No JVD.  Cardiovascular:     Rate and Rhythm: Normal rate and regular rhythm.     Heart sounds: No murmur heard.    No friction rub. No gallop.  Pulmonary:     Effort: No respiratory distress.     Breath sounds: No wheezing.  Abdominal:     General: There is no distension.     Tenderness: There is no abdominal tenderness. There is no guarding or rebound.     Comments: Mild diffuse abdominal pain worse in the epigastrium and left lower quadrant  Musculoskeletal:        General: Normal range of motion.     Cervical back: Normal range of motion and neck supple.  Skin:    Coloration: Skin is not pale.     Findings: No rash.  Neurological:     Mental Status: He is alert and oriented to person, place, and time.  Psychiatric:        Behavior: Behavior normal.     ED Results / Procedures / Treatments   Labs (all labs ordered are listed, but only abnormal results are displayed) Labs Reviewed  LIPASE, BLOOD - Abnormal; Notable for the following  components:      Result Value   Lipase 3,853 (*)    All other components within normal limits  COMPREHENSIVE METABOLIC PANEL - Abnormal; Notable for the following components:   Glucose, Bld 103 (*)    Creatinine, Ser 1.57 (*)    AST 189 (*)    ALT 121 (*)    GFR, Estimated 45 (*)    All other components within  normal limits  CBC - Abnormal; Notable for the following components:   Platelets 141 (*)    All other components within normal limits  URINALYSIS, ROUTINE W REFLEX MICROSCOPIC  TROPONIN I (HIGH SENSITIVITY)    EKG None  Radiology CT ABDOMEN PELVIS W CONTRAST  Result Date: 09/16/2023 CLINICAL DATA:  Epigastric pain nausea vomiting EXAM: CT ABDOMEN AND PELVIS WITH CONTRAST TECHNIQUE: Multidetector CT imaging of the abdomen and pelvis was performed using the standard protocol following bolus administration of intravenous contrast. RADIATION DOSE REDUCTION: This exam was performed according to the departmental dose-optimization program which includes automated exposure control, adjustment of the mA and/or kV according to patient size and/or use of iterative reconstruction technique. CONTRAST:  OMNIPAQUE IOHEXOL 300 MG/ML  SOLN COMPARISON:  02/26/2022 FINDINGS: Lower chest: Lung bases are clear. Partially visualized cardiac pacing leads. Hepatobiliary: Subcentimeter hypodensities too small to further characterize. Gallstones. No biliary dilatation Pancreas: Unremarkable. No pancreatic ductal dilatation or surrounding inflammatory changes. Spleen: Normal in size without focal abnormality. Adrenals/Urinary Tract: Adrenal glands are unremarkable. Kidneys are normal, without renal calculi, focal lesion, or hydronephrosis. Bladder is unremarkable. Stomach/Bowel: Stomach is within normal limits. Appendix appears normal. No evidence of bowel wall thickening, distention, or inflammatory changes. Mild diverticular disease of the colon without acute inflammation Vascular/Lymphatic: Moderate  aortic atherosclerosis. Aneurysmal dilatation of the distal infrarenal abdominal aorta measuring up to 3.1 cm, coronal series 601 image 91. Aneurysmal dilatation of left common iliac artery measuring 2.5 cm. Mild aneurysmal dilatation of the right proximal external iliac artery measuring up to 1.5 cm. No suspicious lymph nodes Reproductive: Enlarged prostate Other: Negative for pelvic effusion or free air. Small fat containing inguinal hernias Musculoskeletal: No acute or suspicious osseous abnormality. IMPRESSION: 1. No CT evidence for acute intra-abdominal or pelvic abnormality. 2. Gallstones. 3. 3.1 cm infrarenal abdominal aortic aneurysm. Recommend follow-up ultrasound every 3 years. (Ref.: J Vasc Surg. 2018; 67:2-77 and J Am Coll Radiol 2013;10(10):789-794.) 4. Enlarged prostate. 5. Mild diverticular disease of the colon without acute inflammation. 6. Aortic atherosclerosis. Aortic Atherosclerosis (ICD10-I70.0). Electronically Signed   By: Jasmine Pang M.D.   On: 09/16/2023 21:19    Procedures Procedures    Medications Ordered in ED Medications  alum & mag hydroxide-simeth (MAALOX/MYLANTA) 200-200-20 MG/5ML suspension 30 mL (30 mLs Oral Given 09/16/23 1846)  morphine (PF) 4 MG/ML injection 4 mg (4 mg Intravenous Given 09/16/23 1850)  ondansetron (ZOFRAN) injection 4 mg (4 mg Intravenous Given 09/16/23 1850)  iohexol (OMNIPAQUE) 300 MG/ML solution 100 mL (100 mLs Intravenous Contrast Given 09/16/23 1955)  morphine (PF) 4 MG/ML injection 4 mg (4 mg Intravenous Given 09/16/23 2128)  ondansetron (ZOFRAN) injection 4 mg (4 mg Intravenous Given 09/16/23 2127)    ED Course/ Medical Decision Making/ A&P                                 Medical Decision Making Amount and/or Complexity of Data Reviewed Labs: ordered. Radiology: ordered.  Risk OTC drugs. Prescription drug management. Decision regarding hospitalization.   77 yo M with a chief complaints of epigastric abdominal discomfort.   Going on since yesterday.  He thinks it is stomach ulcers.  Had an issue with that in the past.  Is on pantoprazole for that.  He had 1 episode of emesis after chewing a lot of ginger chews and drinking some ginger tea.  He has some mild epigastric discomfort on exam has some mild  pain in the left lower quadrant.  Will obtain a laboratory evaluation treat pain and nausea.  CT abdomen pelvis.  Patient has mild elevation of AST and ALT.  Total bilirubin is normal.  Creatinine is better from last check.  His troponin is negative.  His lipase is elevated at 3800.  CT scan of the abdomen pelvis on my independent hypertension with gallstones.  Suspect likely he has gallstone pancreatitis.  Likely require admission.  Awaiting radiology read.  Repeat assessment patient had had improvement of his discomfort but had started feeling worse again was discussing his results.  CT scan read by radiology without obvious acute intra-abdominal pathology.  They did note the gallstones.  Will discuss with medicine.  The patients results and plan were reviewed and discussed.   Any x-rays performed were independently reviewed by myself.   Differential diagnosis were considered with the presenting HPI.  Medications  alum & mag hydroxide-simeth (MAALOX/MYLANTA) 200-200-20 MG/5ML suspension 30 mL (30 mLs Oral Given 09/16/23 1846)  morphine (PF) 4 MG/ML injection 4 mg (4 mg Intravenous Given 09/16/23 1850)  ondansetron (ZOFRAN) injection 4 mg (4 mg Intravenous Given 09/16/23 1850)  iohexol (OMNIPAQUE) 300 MG/ML solution 100 mL (100 mLs Intravenous Contrast Given 09/16/23 1955)  morphine (PF) 4 MG/ML injection 4 mg (4 mg Intravenous Given 09/16/23 2128)  ondansetron (ZOFRAN) injection 4 mg (4 mg Intravenous Given 09/16/23 2127)    Vitals:   09/16/23 1735 09/16/23 2045 09/16/23 2048 09/16/23 2130  BP:  (!) 172/87 (!) 172/87 (!) 171/93  Pulse:  (!) 57 (!) 57 60  Resp:   18 20  Temp:    97.7 F (36.5 C)   TempSrc:    Oral  SpO2:  96% 96% 95%  Weight: 97.5 kg     Height: 5\' 10"  (1.778 m)       Final diagnoses:  Acute biliary pancreatitis, unspecified complication status    Admission/ observation were discussed with the admitting physician, patient and/or family and they are comfortable with the plan.          Final Clinical Impression(s) / ED Diagnoses Final diagnoses:  Acute biliary pancreatitis, unspecified complication status    Rx / DC Orders ED Discharge Orders     None         Melene Plan, DO 09/16/23 2147

## 2023-09-16 NOTE — Progress Notes (Addendum)
Plan of Care Note for accepted transfer  Patient: Martin Rubio              ZOX:[id number]  DOA: 09/16/2023     Facility requesting transfer: [facility] ED Requesting Provider: Melene Plan, DO  Reason for transfer: Management for acute pancreatitis  Facility course:  77 yo M medical history of complete heart block s/p Medtronic dual-chamber pacemaker placement in 2021, atrial fibrillation s/p ablation 12/24/2022, chronic bronchitis, chronic diverticulosis, BPH, Graves' disease status post ablation, essential hypertension, hyperlipidemia, obesity, psoriatic arthritis, rheumatoid arthritis, sleep apnea on CPAP and benign positional vertigo presented to emergency department with chief complaints of epigastric abdominal discomfort with stated nausea and vomiting for 1 day.    He noticed this when he laid back to go to sleep last night. Like when he had had ulcers in the past. He has been chewing ginger chews had an episode where he vomited them back up. Otherwise denies any issues eating or drinking. No fevers. No obvious exertional symptoms though does not exert himself very often. Was seen recently by his cardiologist and told that his heart was good. Denies cough congestion.   At presentation to ED patient found to have elevated blood pressure 172/87, heart rate 59, respiratory rate 18 and O2 sat 96% room air. Elevated lipase 3853. CMP showing elevated creatinine 1.57, elevated AST 189, elevated ALT 121, bilirubin within normal range 1.2. CBC showing evidence of thrombocytopenia 141. Normal troponin level - UA pending.  CT abdomen pelvis: IMPRESSION: 1. No CT evidence for acute intra-abdominal or pelvic abnormality. 2. Gallstones. 3. 3.1 cm infrarenal abdominal aortic aneurysm. Recommend follow-up ultrasound every 3 years. (Ref.: J Vasc Surg. 2018; 67:2-77 and J Am Coll Radiol 2013;10(10):789-794.) 4. Enlarged prostate. 5. Mild diverticular disease of the colon without  acute inflammation. 6. Aortic atherosclerosis.  In the ED patient received Maalox, morphine and Zofran.  Hospitalist has been consulted for further evaluation and management of acute pancreatitis.  Plan of care: The patient is accepted for admission for inpatient status to Med-surg  unit, at [facility] or [facility] long hospital.  Check [email or web address] for on-call coverage.  [facility] will assume care on arrival to accepting facility. Until arrival, medical decision making responsibilities remain with the EDP.  However, [facility] available 24/7 for questions and assistance.   Nursing staff please page [facility] Admits and Consults [phone]) as soon as the patient arrives to the hospital.    Author: Tereasa Coop, MD  09/16/2023  [facility]

## 2023-09-17 ENCOUNTER — Inpatient Hospital Stay (HOSPITAL_COMMUNITY): Payer: Medicare Other

## 2023-09-17 DIAGNOSIS — Z01810 Encounter for preprocedural cardiovascular examination: Secondary | ICD-10-CM

## 2023-09-17 DIAGNOSIS — I442 Atrioventricular block, complete: Secondary | ICD-10-CM | POA: Diagnosis present

## 2023-09-17 DIAGNOSIS — Z79899 Other long term (current) drug therapy: Secondary | ICD-10-CM | POA: Diagnosis not present

## 2023-09-17 DIAGNOSIS — K859 Acute pancreatitis without necrosis or infection, unspecified: Secondary | ICD-10-CM | POA: Diagnosis present

## 2023-09-17 DIAGNOSIS — K851 Biliary acute pancreatitis without necrosis or infection: Secondary | ICD-10-CM

## 2023-09-17 DIAGNOSIS — R7401 Elevation of levels of liver transaminase levels: Secondary | ICD-10-CM | POA: Diagnosis present

## 2023-09-17 DIAGNOSIS — I480 Paroxysmal atrial fibrillation: Secondary | ICD-10-CM

## 2023-09-17 DIAGNOSIS — N1832 Chronic kidney disease, stage 3b: Secondary | ICD-10-CM | POA: Diagnosis present

## 2023-09-17 DIAGNOSIS — L4050 Arthropathic psoriasis, unspecified: Secondary | ICD-10-CM | POA: Diagnosis present

## 2023-09-17 DIAGNOSIS — R1013 Epigastric pain: Secondary | ICD-10-CM | POA: Diagnosis not present

## 2023-09-17 DIAGNOSIS — I495 Sick sinus syndrome: Secondary | ICD-10-CM | POA: Diagnosis present

## 2023-09-17 DIAGNOSIS — I739 Peripheral vascular disease, unspecified: Secondary | ICD-10-CM | POA: Diagnosis present

## 2023-09-17 DIAGNOSIS — N400 Enlarged prostate without lower urinary tract symptoms: Secondary | ICD-10-CM | POA: Diagnosis present

## 2023-09-17 DIAGNOSIS — K810 Acute cholecystitis: Secondary | ICD-10-CM | POA: Diagnosis not present

## 2023-09-17 DIAGNOSIS — I4891 Unspecified atrial fibrillation: Secondary | ICD-10-CM | POA: Diagnosis not present

## 2023-09-17 DIAGNOSIS — E785 Hyperlipidemia, unspecified: Secondary | ICD-10-CM | POA: Diagnosis present

## 2023-09-17 DIAGNOSIS — I129 Hypertensive chronic kidney disease with stage 1 through stage 4 chronic kidney disease, or unspecified chronic kidney disease: Secondary | ICD-10-CM | POA: Diagnosis present

## 2023-09-17 DIAGNOSIS — G4733 Obstructive sleep apnea (adult) (pediatric): Secondary | ICD-10-CM | POA: Diagnosis present

## 2023-09-17 DIAGNOSIS — K219 Gastro-esophageal reflux disease without esophagitis: Secondary | ICD-10-CM | POA: Diagnosis present

## 2023-09-17 DIAGNOSIS — E66811 Obesity, class 1: Secondary | ICD-10-CM | POA: Diagnosis present

## 2023-09-17 DIAGNOSIS — Z6830 Body mass index (BMI) 30.0-30.9, adult: Secondary | ICD-10-CM | POA: Diagnosis not present

## 2023-09-17 DIAGNOSIS — Z79890 Hormone replacement therapy (postmenopausal): Secondary | ICD-10-CM | POA: Diagnosis not present

## 2023-09-17 DIAGNOSIS — Z87891 Personal history of nicotine dependence: Secondary | ICD-10-CM | POA: Diagnosis not present

## 2023-09-17 DIAGNOSIS — K8066 Calculus of gallbladder and bile duct with acute and chronic cholecystitis without obstruction: Secondary | ICD-10-CM | POA: Diagnosis not present

## 2023-09-17 DIAGNOSIS — I700 Atherosclerosis of aorta: Secondary | ICD-10-CM | POA: Diagnosis present

## 2023-09-17 DIAGNOSIS — E669 Obesity, unspecified: Secondary | ICD-10-CM | POA: Insufficient documentation

## 2023-09-17 DIAGNOSIS — M069 Rheumatoid arthritis, unspecified: Secondary | ICD-10-CM | POA: Diagnosis present

## 2023-09-17 DIAGNOSIS — I7143 Infrarenal abdominal aortic aneurysm, without rupture: Secondary | ICD-10-CM | POA: Diagnosis present

## 2023-09-17 DIAGNOSIS — D6869 Other thrombophilia: Secondary | ICD-10-CM | POA: Diagnosis present

## 2023-09-17 DIAGNOSIS — K8020 Calculus of gallbladder without cholecystitis without obstruction: Secondary | ICD-10-CM | POA: Diagnosis not present

## 2023-09-17 DIAGNOSIS — K8012 Calculus of gallbladder with acute and chronic cholecystitis without obstruction: Secondary | ICD-10-CM | POA: Diagnosis not present

## 2023-09-17 DIAGNOSIS — E039 Hypothyroidism, unspecified: Secondary | ICD-10-CM | POA: Diagnosis present

## 2023-09-17 DIAGNOSIS — G4700 Insomnia, unspecified: Secondary | ICD-10-CM | POA: Diagnosis not present

## 2023-09-17 LAB — HEPATIC FUNCTION PANEL
ALT: 105 U/L — ABNORMAL HIGH (ref 0–44)
AST: 95 U/L — ABNORMAL HIGH (ref 15–41)
Albumin: 3.6 g/dL (ref 3.5–5.0)
Alkaline Phosphatase: 85 U/L (ref 38–126)
Bilirubin, Direct: 0.2 mg/dL (ref 0.0–0.2)
Indirect Bilirubin: 1.2 mg/dL — ABNORMAL HIGH (ref 0.3–0.9)
Total Bilirubin: 1.4 mg/dL — ABNORMAL HIGH (ref 0.3–1.2)
Total Protein: 6.3 g/dL — ABNORMAL LOW (ref 6.5–8.1)

## 2023-09-17 LAB — PHOSPHORUS: Phosphorus: 2.9 mg/dL (ref 2.5–4.6)

## 2023-09-17 LAB — BASIC METABOLIC PANEL
Anion gap: 10 (ref 5–15)
BUN: 16 mg/dL (ref 8–23)
CO2: 27 mmol/L (ref 22–32)
Calcium: 9.3 mg/dL (ref 8.9–10.3)
Chloride: 103 mmol/L (ref 98–111)
Creatinine, Ser: 1.64 mg/dL — ABNORMAL HIGH (ref 0.61–1.24)
GFR, Estimated: 43 mL/min — ABNORMAL LOW (ref >60)
Glucose, Bld: 94 mg/dL (ref 70–99)
Potassium: 4.3 mmol/L (ref 3.5–5.1)
Sodium: 140 mmol/L (ref 135–145)

## 2023-09-17 LAB — LIPID PANEL
Cholesterol: 122 mg/dL (ref 0–200)
HDL: 36 mg/dL — ABNORMAL LOW (ref >40)
LDL Cholesterol: 61 mg/dL (ref 0–99)
Total CHOL/HDL Ratio: 3.4 {ratio}
Triglycerides: 123 mg/dL (ref <150)
VLDL: 25 mg/dL (ref 0–40)

## 2023-09-17 LAB — CBC
HCT: 48.6 % (ref 39.0–52.0)
Hemoglobin: 15.9 g/dL (ref 13.0–17.0)
MCH: 28.3 pg (ref 26.0–34.0)
MCHC: 32.7 g/dL (ref 30.0–36.0)
MCV: 86.5 fL (ref 80.0–100.0)
Platelets: 133 10*3/uL — ABNORMAL LOW (ref 150–400)
RBC: 5.62 MIL/uL (ref 4.22–5.81)
RDW: 13.7 % (ref 11.5–15.5)
WBC: 6.4 10*3/uL (ref 4.0–10.5)
nRBC: 0 % (ref 0.0–0.2)

## 2023-09-17 LAB — MAGNESIUM: Magnesium: 2.1 mg/dL (ref 1.7–2.4)

## 2023-09-17 LAB — ETHANOL: Alcohol, Ethyl (B): <10 mg/dL (ref <10)

## 2023-09-17 MED ORDER — METHOCARBAMOL 500 MG PO TABS: 500.0000 mg | ORAL_TABLET | Freq: Three times a day (TID) | ORAL | Status: DC | PRN

## 2023-09-17 MED ORDER — LACTATED RINGERS IV BOLUS
1000.0000 mL | Freq: Once | INTRAVENOUS | Status: AC
  Administered 2023-09-17: 1000 mL via INTRAVENOUS

## 2023-09-17 MED ORDER — PREGABALIN 25 MG PO CAPS
50.0000 mg | ORAL_CAPSULE | Freq: Every day | ORAL | Status: DC
  Filled 2023-09-17 (×2): qty 2

## 2023-09-17 MED ORDER — ONDANSETRON HCL 4 MG/2ML IJ SOLN: 4.0000 mg | Freq: Four times a day (QID) | INTRAMUSCULAR | Status: DC | PRN

## 2023-09-17 MED ORDER — TAMSULOSIN HCL 0.4 MG PO CAPS
0.4000 mg | ORAL_CAPSULE | Freq: Every day | ORAL | Status: DC
  Administered 2023-09-17 – 2023-09-18 (×2): 0.4 mg via ORAL
  Filled 2023-09-17 (×2): qty 1

## 2023-09-17 MED ORDER — MELATONIN 5 MG PO TABS
5.0000 mg | ORAL_TABLET | Freq: Every day | ORAL | Status: DC
  Administered 2023-09-17 – 2023-09-18 (×2): 5 mg via ORAL
  Filled 2023-09-17 (×2): qty 1

## 2023-09-17 MED ORDER — OXYCODONE HCL 5 MG PO TABS
5.0000 mg | ORAL_TABLET | ORAL | Status: DC | PRN
  Administered 2023-09-18 (×3): 5 mg via ORAL
  Filled 2023-09-17 (×3): qty 1

## 2023-09-17 MED ORDER — LEVOTHYROXINE SODIUM 25 MCG PO TABS
137.0000 ug | ORAL_TABLET | Freq: Every day | ORAL | Status: DC
  Administered 2023-09-17 – 2023-09-19 (×3): 137 ug via ORAL
  Filled 2023-09-17 (×3): qty 1

## 2023-09-17 MED ORDER — HYDROMORPHONE HCL 1 MG/ML IJ SOLN: 0.5000 mg | INTRAMUSCULAR | Status: DC | PRN

## 2023-09-17 MED ORDER — LACTATED RINGERS IV SOLN: INTRAVENOUS | Status: AC

## 2023-09-17 MED ORDER — ACETAMINOPHEN 325 MG PO TABS
650.0000 mg | ORAL_TABLET | Freq: Four times a day (QID) | ORAL | Status: DC | PRN
  Administered 2023-09-17 – 2023-09-19 (×3): 650 mg via ORAL
  Filled 2023-09-17 (×3): qty 2

## 2023-09-17 MED ORDER — RIVAROXABAN 20 MG PO TABS: 20.0000 mg | ORAL_TABLET | ORAL | Status: DC

## 2023-09-17 MED ORDER — OXYCODONE HCL 5 MG PO TABS
2.5000 mg | ORAL_TABLET | ORAL | Status: DC | PRN
  Administered 2023-09-18: 2.5 mg via ORAL
  Filled 2023-09-17: qty 1

## 2023-09-17 MED ORDER — PANTOPRAZOLE SODIUM 40 MG PO TBEC
40.0000 mg | DELAYED_RELEASE_TABLET | Freq: Every day | ORAL | Status: DC
  Administered 2023-09-17 – 2023-09-19 (×3): 40 mg via ORAL
  Filled 2023-09-17 (×3): qty 1

## 2023-09-17 MED ORDER — OXYBUTYNIN CHLORIDE ER 10 MG PO TB24
10.0000 mg | ORAL_TABLET | Freq: Every day | ORAL | Status: DC
  Administered 2023-09-17 – 2023-09-18 (×2): 10 mg via ORAL
  Filled 2023-09-17 (×2): qty 1

## 2023-09-17 NOTE — Progress Notes (Signed)
Insomnia: Ordered melatonin per patient request.   Tereasa Coop, MD [facility] 09/17/2023, 9:26 PM

## 2023-09-17 NOTE — Plan of Care (Signed)
  Problem: Education: Goal: Understanding of disease, treatment, and recovery process will improve Outcome: Progressing   Problem: Activity: Goal: Ability to return to baseline activity level will improve Outcome: Progressing   Problem: Cardiac: Goal: Ability to maintain adequate cardiovascular perfusion will improve Outcome: Progressing Goal: Vascular access site(s) Level 0-1 will be maintained Outcome: Progressing   Problem: Health Behavior/ Discharge Planning: Goal: Ability to safely manage health related needs after discharge Outcome: Progressing

## 2023-09-17 NOTE — TOC CM/SW Note (Addendum)
Transition of Care [facility]) - Inpatient Brief Assessment   Patient Details  Name: Martin Rubio MRN: [id number] Date of Birth: 05/07/1946  [facility] CM/SW Contact:    Tom-Johnson, Hershal Coria, RN Phone Number: 09/17/2023, 3:42 PM   Clinical Narrative:  [facility] with Epigastric Pain with N/V.   Patient transferred to [facility] for further Management with Acute Gallstone Pancreatitis.  Pt has hx of Gastric Ulcers, Heart Block s/p Medtronic Dual-Chamber Pacemaker, A-Fib s/p ablation (12/24/2022), PUD, Chronic Bronchitis, BPH, Graves Disease, Rheumatoid Arthritis.   Patient currently on Tikosyn, Xarelto on hold for possible Sx. Gen Sx following, plan for Laparoscopic Cholecystectomy tomorrow 09/18/23 pending Cardiology clearance.   From home with wife, has two supportive children.  Independent with care and drive self prior to admission. Has a cane at home.  PCP is Wanda Plump, MD and uses [facility] [employer] on [address] and also [facility].   No TOC needs or recommendations noted at this time.  Patient not Medically ready for discharge.  CM will continue to follow as patient progresses with care towards discharge.         Transition of Care Asessment: Insurance and Status: Insurance coverage has been reviewed Patient has primary care physician: Yes Home environment has been reviewed: Yes Prior level of function:: Independent Prior/Current Home Services: No current home services Social Determinants of Health Reivew: SDOH reviewed no interventions necessary Readmission risk has been reviewed: Yes Transition of care needs: no transition of care needs at this time

## 2023-09-17 NOTE — Consult Note (Addendum)
Cardiology Consultation   Patient ID: Martin Rubio MRN: [id number]; DOB: 11-Jul-1946  Admit date: 09/16/2023 Date of Consult: 09/17/2023  PCP:  Wanda Plump, MD   [facility] Providers Cardiologist:  Rollene Rotunda, MD  Electrophysiologist:  Maurice Small, MD       Patient Profile:   Martin Rubio is a 77 y.o. male with a hx of hypertension, PAF on Tikosyn, h/o tachy-brady syndrome s/p dual chamber Medtronic PPM 12/13/2019, PAD, AAA, OSA, CKD and Graves' disease who is being seen 09/17/2023 for preoperative clearance prior to laparoscopic cholecystectomy at the request of [facility].  History of Present Illness:   Martin Rubio is a 77 year old male was past medical history of hypertension, PAF on Tikosyn, h/o tachy-brady syndrome s/p dual chamber Medtronic PPM 12/13/2019, PAD, AAA, OSA, CKD and Graves' disease.  Due to persistent A-fib, he was started on Tikosyn.  Last echocardiogram obtained in January 2021 showed EF 55 to 60%, grade 1 DD, no significant valve issue.  Myoview in June 2021 showed EF 64%, normal perfusion without ischemia or infarction, overall low risk study.  Lower extremity angiography performed on 01/14/2022 demonstrated small to medium sized aortic aneurysm above the iliac bifurcation, severe occlusive disease affecting bilateral common iliac artery, worse on the left was a large sized aneurysm and a medium sized aneurysm on the right side with very tortuous iliac arteries.  Endovascular options were limited.  He was seen by Dr. Sherral Hammers of vascular surgery, given lack of significant claudication symptom and aneurysm size, it was elected to continue medical therapy and observe for now.  He underwent atrial fibrillation ablation in 2024 by Dr. Nelly Laurence.  Patient opted to stay on the Tikosyn and Xarelto even after atrial fibrillation ablation.  He was recently seen by Dr. Antoine Poche on 09/13/2023 at which time he was doing well.  According to the patient,  he is not very active, functional ability is mainly limited by peripheral neuropathy and arthritis.  He denies any recent chest pain or worsening dyspnea.  He occasionally goes to the gym to exercise on stationary bicycle and does upper extremity exercises as well.  The last time he was in the gym was last week where he rode stationary bicycle for 15 minutes.  In the past year, he has been having occasional epigastric pain worse with fatty food.  In the past 3 to 5 days, symptom worsened, this prompted the patient to seek urgent medical attention at [facility] on 09/16/2023.  Initial lab work was significant for creatinine of 1.57, lipase elevated at 3853, AST 189, ALT 121, hemoglobin normal, platelet 141.  CT of abdomen pelvis with contrast demonstrated no evidence of acute intra-abdominal or pelvic abnormality, 3.1 cm infrarenal abdominal aortic aneurysm, aortic atherosclerosis, mild diverticular disease of the colon without acute inflammation.  Right upper quadrant abdominal ultrasound showed cholelithiasis without evidence of acute cholecystitis, common bile duct measuring 7 mm.  EKG showed paced rhythm with heart rate of 60 bpm.  Patient has been seen by general surgery service who felt his symptom is consistent with gallstone pancreatitis.  They plan to proceed with laparoscopic cholecystectomy during this admission.  Cardiology service consulted for preoperative clearance.    Past Medical History:  Diagnosis Date   Bronchitis    Diverticulosis    FH: BPH (benign prostatic hypertrophy)    History of Graves' disease 01/19/2018   S/p ablation   HTN (hypertension)    Hyperlipidemia    Obesity  Psoriatic arthritis (HCC)    Rheumatoid arthritis (HCC) 08/07/2014   Sleep apnea    CPAP   Thyroid disease     Past Surgical History:  Procedure Laterality Date   ABDOMINAL AORTOGRAM W/LOWER EXTREMITY N/A 01/14/2022   Procedure: ABDOMINAL AORTOGRAM W/LOWER EXTREMITY;  Surgeon: Iran Ouch, MD;  Location: [facility] INVASIVE CV LAB;  Service: Cardiovascular;  Laterality: N/A;   ATRIAL FIBRILLATION ABLATION N/A 12/24/2022   Procedure: ATRIAL FIBRILLATION ABLATION;  Surgeon: Maurice Small, MD;  Location: [facility] INVASIVE CV LAB;  Service: Cardiovascular;  Laterality: N/A;   INGUINAL HERNIA REPAIR     bilateral   KNEE SURGERY     bilateral arthroscopic   l foot surgery Left 12/2020   PACEMAKER IMPLANT N/A 12/13/2019   Procedure: PACEMAKER IMPLANT;  Surgeon: Marinus Maw, MD;  Location: [facility] INVASIVE CV LAB;  Service: Cardiovascular;  Laterality: N/A;   TRANSURETHRAL RESECTION OF PROSTATE     UMBILICAL HERNIA REPAIR       Home Medications:  Prior to Admission medications   Medication Sig Start Date End Date Taking? Authorizing Provider  amLODipine (NORVASC) 5 MG tablet Take 1 tablet (5 mg total) by mouth daily. 03/09/23  Yes Rollene Rotunda, MD  cholecalciferol (VITAMIN D3) 25 MCG (1000 UNIT) tablet Take 2,000 Units by mouth daily in the afternoon.   Yes [provider]  Cyanocobalamin (VITAMIN B 12) 500 MCG TABS Take 1,000 mcg by mouth daily in the afternoon. 02/29/20  Yes Paz, Nolon Rod, MD  diclofenac Sodium (VOLTAREN) 1 % GEL Apply 4 g topically in the morning and at bedtime. Patient taking differently: Apply 4 g topically 2 (two) times daily as needed (knee pain.). 12/16/21  Yes Paz, Nolon Rod, MD  dofetilide (TIKOSYN) 250 MCG capsule TAKE ONE CAPSULE BY MOUTH TWICE A DAY 09/25/22  Yes Rollene Rotunda, MD  Evolocumab (REPATHA SURECLICK) 140 MG/ML SOAJ Inject 140 mg into the skin every 14 (fourteen) days. 03/15/23  Yes Iran Ouch, MD  ferrous fumarate (FERRETTS) 325 (106 Fe) MG TABS tablet Take 1 tablet (106 mg of iron total) by mouth 2 (two) times daily. Patient taking differently: Take 1 tablet by mouth daily. 08/30/23  Yes Paz, Nolon Rod, MD  fluticasone [facility]) 50 MCG/ACT nasal spray Place 1 spray into both nostrils as needed for allergies.   Yes [provider]   hydrOXYzine (VISTARIL) 25 MG capsule Take 1 capsule (25 mg total) by mouth every 8 (eight) hours as needed. 08/02/23  Yes Wanda Plump, MD  levothyroxine (SYNTHROID) 137 MCG tablet Take 1 tablet (137 mcg total) by mouth daily before breakfast. 08/30/23  Yes Paz, Nolon Rod, MD  losartan (COZAAR) 50 MG tablet TAKE ONE TABLET BY MOUTH EVERY MORNING AND TAKE ONE TABLET BY MOUTH AT BEDTIME 06/14/23  Yes Iran Ouch, MD  meclizine (ANTIVERT) 25 MG tablet Take 25 mg by mouth 3 (three) times daily as needed for dizziness. 01/19/18  Yes [provider]  Melatonin 5 MG TABS Take 5 mg by mouth at bedtime.   Yes [provider]  methocarbamol (ROBAXIN) 500 MG tablet Take 1 tablet (500 mg total) by mouth every 8 (eight) hours as needed for muscle spasms. 05/11/23  Yes Hudnall, Azucena Fallen, MD  OVER THE COUNTER MEDICATION Take 2 capsules by mouth daily in the afternoon. Nervprin - Healthy Nerve Support - Peripheral Neuropathy Relief   Yes [provider]  oxybutynin (DITROPAN-XL) 10 MG 24 hr tablet Take 10 mg by  mouth at bedtime. 11/04/22  Yes [provider]  pantoprazole (PROTONIX) 40 MG tablet Take 1 tablet (40 mg total) by mouth daily. 06/07/23  Yes Paz, Nolon Rod, MD  pregabalin (LYRICA) 50 MG capsule Take 50 mg by mouth at bedtime as needed. 04/17/22  Yes [provider]  rivaroxaban (XARELTO) 20 MG TABS tablet Take 1 tablet (20 mg total) by mouth daily with supper. Dose change, please call [phone] 06/08/23  Yes Mealor, Roberts Gaudy, MD  tadalafil (CIALIS) 20 MG tablet Take 20 mg by mouth daily as needed for erectile dysfunction. 10/01/22  Yes [provider]  tamsulosin (FLOMAX) 0.4 MG CAPS capsule Take 0.4 mg by mouth at bedtime. 03/01/19  Yes [provider]  triamcinolone cream (KENALOG) 0.1 % Apply 1 application topically daily as needed for itching. 08/22/19  Yes [provider]    Inpatient Medications: Scheduled Meds:  amLODipine  5 mg Oral  Daily   dofetilide  250 mcg Oral BID   levothyroxine  137 mcg Oral Q0600   losartan  50 mg Oral Daily   oxybutynin  10 mg Oral QHS   pantoprazole  40 mg Oral Daily   pregabalin  50 mg Oral QHS   tamsulosin  0.4 mg Oral QHS   Continuous Infusions:  PRN Meds: acetaminophen, HYDROmorphone (DILAUDID) injection, methocarbamol, ondansetron (ZOFRAN) IV, oxyCODONE **OR** oxyCODONE  Allergies:    Allergies  Allergen Reactions   Codeine     Tension, nasty feeling    Nsaids Other (See Comments)    Renal insufficiency   Prednisone Other (See Comments)    Unknown/ sometimes takes with lower dose   Statins Other (See Comments)    Joint pain    Sulfa Antibiotics Other (See Comments)    Joint pain    Social History:   Social History   Socioeconomic History   Marital status: Married    Spouse name: Not on file   Number of children: 2   Years of education: Not on file   Highest education level: Not on file  Occupational History   Occupation: Retired    Associate Professor: [employer]  Tobacco Use   Smoking status: Former    Types: Cigarettes   Smokeless tobacco: Never   Tobacco comments:    Former smoker Quit 46 years ago. 01/21/23  Vaping Use   Vaping status: Never Used  Substance and Sexual Activity   Alcohol use: No   Drug use: No   Sexual activity: Not on file  Other Topics Concern   Not on file  Social History Narrative   Lives with wife.   Social Determinants of Health   Financial Resource Strain: Low Risk  (01/26/2022)   Overall Financial Resource Strain (CARDIA)    Difficulty of Paying Living Expenses: Not hard at all  Food Insecurity: No Food Insecurity (09/17/2023)   Hunger Vital Sign    Worried About Running Out of Food in the Last Year: Never true    Ran Out of Food in the Last Year: Never true  Transportation Needs: No Transportation Needs (09/17/2023)   PRAPARE - Administrator, Civil Service (Medical): No    Lack of Transportation (Non-Medical):  No  Physical Activity: Inactive (01/26/2022)   Exercise Vital Sign    Days of Exercise per Week: 0 days    Minutes of Exercise per Session: 0 min  Stress: No Stress Concern Present (01/26/2022)   [employer] of Occupational Health - Occupational Stress Questionnaire  Feeling of Stress : Not at all  Social Connections: Moderately Integrated (01/26/2022)   Social Connection and Isolation Panel [NHANES]    Frequency of Communication with Friends and Family: Three times a week    Frequency of Social Gatherings with Friends and Family: Twice a week    Attends Religious Services: More than 4 times per year    Active Member of [employer] or Organizations: No    Attends Banker Meetings: Never    Marital Status: Married  Catering manager Violence: Not At Risk (09/17/2023)   Humiliation, Afraid, Rape, and Kick questionnaire    Fear of Current or Ex-Partner: No    Emotionally Abused: No    Physically Abused: No    Sexually Abused: No    Family History:    Family History  Problem Relation Age of Onset   CAD Mother 79       CABG   CAD Father 41       Died age 78   Diabetes Brother    Lung cancer Maternal Grandmother    Prostate cancer Neg Hx    Colon cancer Neg Hx      ROS:  Please see the history of present illness.   All other ROS reviewed and negative.     Physical Exam/Data:   Vitals:   09/17/23 0200 09/17/23 0201 09/17/23 0305 09/17/23 0832  BP: (!) 145/77  (!) 186/82 (!) 158/71  Pulse: (!) 59 (!) 59 (!) 59 60  Resp: 15  18   Temp: 98.5 F (36.9 C)  (!) 97.4 F (36.3 C) 97.6 F (36.4 C)  TempSrc:    Oral  SpO2: 90% 92% 99% 100%  Weight:   96.2 kg   Height:        Intake/Output Summary (Last 24 hours) at 09/17/2023 1519 Last data filed at 09/17/2023 1449 Gross per 24 hour  Intake 1210.53 ml  Output 2300 ml  Net -1089.47 ml      09/17/2023    3:05 AM 09/16/2023    5:35 PM 09/13/2023   10:34 AM  Last 3 Weights  Weight (lbs) 212 lb 1.3 oz 215  lb 215 lb 12.8 oz  Weight (kg) 96.2 kg 97.523 kg 97.886 kg     Body mass index is 30.43 kg/m.  General:  Well nourished, well developed, in no acute distress HEENT: normal Neck: no JVD Vascular: No carotid bruits; Distal pulses 2+ bilaterally Cardiac:  normal S1, S2; RRR; no murmur  Lungs:  clear to auscultation bilaterally, no wheezing, rhonchi or rales  Abd: soft, nontender, no hepatomegaly  Ext: no edema Musculoskeletal:  No deformities, BUE and BLE strength normal and equal Skin: warm and dry  Neuro:  CNs 2-12 intact, no focal abnormalities noted Psych:  Normal affect   EKG:  The EKG was personally reviewed and demonstrates: Atrial paced rhythm Telemetry:  Telemetry was personally reviewed and demonstrates:  not on telemetry  Relevant CV Studies:  Myoview 05/07/2020 The left ventricular ejection fraction is normal (55-65%). Nuclear stress EF: 64%. There was no ST segment deviation noted during stress. No T wave inversion was noted during stress. The study is normal. This is a low risk study.   1. Normal myocardial perfusion imaging study without evidence of ischemia or infarction.  2. Normal LVEF, 64%.  3. This is a low-risk study.   Laboratory Data:  High Sensitivity Troponin:   Recent Labs  Lab 09/16/23 1858  TROPONINIHS 17     Chemistry Recent  Labs  Lab 09/14/23 0000 09/16/23 1857 09/17/23 0454  NA 144 140 140  K 6.0* 4.0 4.3  CL 102 105 103  CO2 22 24 27   GLUCOSE  --  103* 94  BUN 18 20 16   CREATININE 1.7* 1.57* 1.64*  CALCIUM 9.7 9.4 9.3  MG  --   --  2.1  GFRNONAA  --  45* 43*  ANIONGAP  --  11 10    Recent Labs  Lab 09/14/23 0000 09/16/23 1857 09/17/23 0454  PROT  --  6.5 6.3*  ALBUMIN 4.7 3.8 3.6  AST 27 189* 95*  ALT 21 121* 105*  ALKPHOS 81 92 85  BILITOT  --  1.2 1.4*   Lipids  Recent Labs  Lab 09/17/23 0454  CHOL 122  TRIG 123  HDL 36*  LDLCALC 61  CHOLHDL 3.4    Hematology Recent Labs  Lab 09/14/23 0000  09/16/23 1857 09/17/23 0454  WBC 7.2 6.9 6.4  RBC 5.82* 5.70 5.62  HGB 16.2 16.2 15.9  HCT 52 48.1 48.6  MCV  --  84.4 86.5  MCH  --  28.4 28.3  MCHC  --  33.7 32.7  RDW  --  13.8 13.7  PLT 171 141* 133*   Thyroid No results for input(s): "TSH", "FREET4" in the last 168 hours.  BNPNo results for input(s): "BNP", "PROBNP" in the last 168 hours.  DDimer No results for input(s): "DDIMER" in the last 168 hours.   Radiology/Studies:  US Abdomen Limited RUQ (LIVER/GB)  Result Date: 09/17/2023 CLINICAL DATA:  Gallstone pancreatitis.  History of epigastric pain. EXAM: ULTRASOUND ABDOMEN LIMITED RIGHT UPPER QUADRANT COMPARISON:  CT abdomen 09/16/2023 FINDINGS: Gallbladder: Multiple layering echogenic stones in the gallbladder. Posterior acoustic shadowing associated with the gallstones. Gallbladder wall is borderline measuring approximately 3 mm. No sonographic Murphy sign. No pericholecystic fluid. Common bile duct: Diameter: 7 mm and similar to recent CT. Liver: Slightly increased echogenicity. Liver is difficult to visualize and appears to be mildly heterogeneous. Liver is better characterized on the recent CT. No discrete lesion identified. Portal vein is patent on color Doppler imaging with normal direction of blood flow towards the liver. No intrahepatic biliary dilatation. Other: None. IMPRESSION: 1. Cholelithiasis without evidence for acute cholecystitis. 2. Common bile duct measures 7 mm and similar to recent CT. Electronically Signed   By: Richarda Overlie M.D.   On: 09/17/2023 10:03   CT ABDOMEN PELVIS W CONTRAST  Result Date: 09/16/2023 CLINICAL DATA:  Epigastric pain nausea vomiting EXAM: CT ABDOMEN AND PELVIS WITH CONTRAST TECHNIQUE: Multidetector CT imaging of the abdomen and pelvis was performed using the standard protocol following bolus administration of intravenous contrast. RADIATION DOSE REDUCTION: This exam was performed according to the departmental dose-optimization program which  includes automated exposure control, adjustment of the mA and/or kV according to patient size and/or use of iterative reconstruction technique. CONTRAST:  OMNIPAQUE IOHEXOL 300 MG/ML  SOLN COMPARISON:  02/26/2022 FINDINGS: Lower chest: Lung bases are clear. Partially visualized cardiac pacing leads. Hepatobiliary: Subcentimeter hypodensities too small to further characterize. Gallstones. No biliary dilatation Pancreas: Unremarkable. No pancreatic ductal dilatation or surrounding inflammatory changes. Spleen: Normal in size without focal abnormality. Adrenals/Urinary Tract: Adrenal glands are unremarkable. Kidneys are normal, without renal calculi, focal lesion, or hydronephrosis. Bladder is unremarkable. Stomach/Bowel: Stomach is within normal limits. Appendix appears normal. No evidence of bowel wall thickening, distention, or inflammatory changes. Mild diverticular disease of the colon without acute inflammation Vascular/Lymphatic: Moderate aortic atherosclerosis. Aneurysmal dilatation  of the distal infrarenal abdominal aorta measuring up to 3.1 cm, coronal series 601 image 91. Aneurysmal dilatation of left common iliac artery measuring 2.5 cm. Mild aneurysmal dilatation of the right proximal external iliac artery measuring up to 1.5 cm. No suspicious lymph nodes Reproductive: Enlarged prostate Other: Negative for pelvic effusion or free air. Small fat containing inguinal hernias Musculoskeletal: No acute or suspicious osseous abnormality. IMPRESSION: 1. No CT evidence for acute intra-abdominal or pelvic abnormality. 2. Gallstones. 3. 3.1 cm infrarenal abdominal aortic aneurysm. Recommend follow-up ultrasound every 3 years. (Ref.: J Vasc Surg. 2018; 67:2-77 and J Am Coll Radiol 2013;10(10):789-794.) 4. Enlarged prostate. 5. Mild diverticular disease of the colon without acute inflammation. 6. Aortic atherosclerosis. Aortic Atherosclerosis (ICD10-I70.0). Electronically Signed   By: Jasmine Pang M.D.   On:  09/16/2023 21:19     Assessment and Plan:   Preoperative clearance  -Not very active, however still able to ride a stationary bicycle for 15 minutes last week without any exertional chest pain.  Patient does not have prior history of CAD but does have PAD and lower extremity aortic and iliac aneurysm.  Xarelto held in anticipation of surgery.  -Given lack of anginal symptom, no further work up is recommended, he is at acceptable risk to proceed with surgery.   -RCRI perioperative risk is class I, 3.9% risk of major cardiac event.  Gallstone pancreatitis: Seen by general surgery service, lipase elevated at 3853.  Right upper quadrant abdominal ultrasound showed gallstones.  Suspect patient has gallstone pancreatitis, plan for laparoscopic cholecystectomy  Hypertension: On amlodipine and losartan  PAF on Tikosyn: Xarelto on hold  History of tachy-brady syndrome status post dual-chamber Medtronic PPM 12/13/2019  PAD: Denies any claudication symptoms.  Last lower extremity angiography was in February 2023.  Followed by Dr. Sherral Hammers of vascular surgery  AAA: Followed by Dr. Sherral Hammers vascular surgery  OSA  CKD: Creatinine around 1.6   Risk Assessment/Risk Scores:                For questions or updates, please contact [facility] Please consult [email or web address] for contact info under    Ramond Dial, [place]  09/17/2023 3:19 PM

## 2023-09-17 NOTE — Progress Notes (Addendum)
PROGRESS NOTE    SAKET PAPINI  ZOX:[id number] DOB: 11-28-1946 DOA: 09/16/2023 PCP: Wanda Plump, MD   Brief Narrative:  HPI:  Martin Rubio is a 77 y.o. male with hx of complete heart block s/p Medtronic dual-chamber pacemaker placement in 2021, atrial fibrillation s/p ablation 12/24/2022, PUD, chronic bronchitis, BPH, Graves' disease status post ablation, hypertension, hyperlipidemia, obesity, psoriatic arthritis, rheumatoid arthritis, sleep apnea on CPAP, was transferred from [facility] ED for acute pancreatitis.  Although he focuses on worsening symptoms in the past day-  Reveals 1 year history of colicky epigastric pain with fatty food, this has been especially worse in the past few weeks.  Then 1 day prior to admission was woke from sleep at 2 AM with severe epigastric pain.  Later that same day he ate lunch with a fatty potato soup and had severe exacerbation of his pain.  Overall has had constant pain since last night at 2 AM.  Denies any fevers, chills.  He has nausea, spitting of saliva.  Did have an episode of vomiting yesterday.  Did not have any known history of gallstones in the past.   Assessment & Plan:   Principal Problem:   Acute gallstone pancreatitis Active Problems:   Essential hypertension   Hyperlipidemia LDL goal <100   Esophageal reflux   Hypothyroidism   Obstructive sleep apnea   Rheumatoid arthritis (HCC)   Benign prostatic hyperplasia   Paroxysmal atrial fibrillation (HCC)   Sick sinus syndrome (HCC)   Hypercoagulable state due to paroxysmal atrial fibrillation (HCC)   Chronic kidney disease, stage 3b (HCC)   Obesity (BMI 30-39.9)  Acute gallstone pancreatitis: No evidence of cholecystitis on CT or ultrasound.  LFTs elevated yesterday which are improving indicating that he probably had choledocholithiasis and has passed the stones.  No indication of MRCP.  Pain improved, 2 out of 10 compared to 10 out of 10 yesterday.  Tolerated clear liquid  diet.  Advance to full liquid for lunch and then soft for dinner if he continues to tolerate.  Have sent a page to general surgery to consult, he is going to need lap cholecystectomy either inpatient or outpatient, timing to be decided by general surgery.  Essential hypertension: Blood pressure elevated, continue amlodipine and resume losartan.   GERD: Continue PPI.  Hypothyroidism/Graves' disease: Continue Synthroid.  Paroxysmal atrial fibrillation: Continue rate control medications and antiarrhythmics but hold anticoagulation in the face of anticipated surgery/cholecystectomy, pending evaluation by general surgery.  BPH: Flomax.  CKD stage IIIb: At baseline.  Obesity: Weight loss and dietary modification counseled.  Chronic intermittent thrombocytopenia: No evidence of bleeding.  Monitor.  DVT prophylaxis: SCD   Code Status: Full Code  Family Communication: Wife present at bedside.  Plan of care discussed with patient in length and he/she verbalized understanding and agreed with it.  Status is: Inpatient Remains inpatient appropriate because: Slowly advancing diet and pending evaluation by general surgery.   Estimated body mass index is 30.43 kg/m as calculated from the following:   Height as of this encounter: 5\' 10"  (1.778 m).   Weight as of this encounter: 96.2 kg.    Nutritional Assessment: Body mass index is 30.43 kg/m.Marland Kitchen Seen by dietician.  I agree with the assessment and plan as outlined below: Nutrition Status:        . Skin Assessment: I have examined the patient's skin and I agree with the wound assessment as performed by the wound care RN as outlined below:  Consultants:  General surgery  Procedures:  None  Antimicrobials:  Anti-infectives (From admission, onward)    None         Subjective: Seen and examined, pain is improving.  No other complaint.  Wife at the bedside.  Objective: Vitals:   09/17/23 0200 09/17/23 0201 09/17/23 0305  09/17/23 0832  BP: (!) 145/77  (!) 186/82 (!) 158/71  Pulse: (!) 59 (!) 59 (!) 59 60  Resp: 15  18   Temp: 98.5 F (36.9 C)  (!) 97.4 F (36.3 C) 97.6 F (36.4 C)  TempSrc:    Oral  SpO2: 90% 92% 99% 100%  Weight:   96.2 kg   Height:        Intake/Output Summary (Last 24 hours) at 09/17/2023 1119 Last data filed at 09/17/2023 1031 Gross per 24 hour  Intake 1210.53 ml  Output 1400 ml  Net -189.47 ml   Filed Weights   09/16/23 1735 09/17/23 0305  Weight: 97.5 kg 96.2 kg    Examination:  General exam: Appears calm and comfortable  Respiratory system: Clear to auscultation. Respiratory effort normal. Cardiovascular system: S1 & S2 heard, RRR. No JVD, murmurs, rubs, gallops or clicks. No pedal edema. Gastrointestinal system: Abdomen is nondistended, soft and nontender. No organomegaly or masses felt. Normal bowel sounds heard. Central nervous system: Alert and oriented. No focal neurological deficits. Extremities: Symmetric 5 x 5 power. Skin: No rashes, lesions or ulcers Psychiatry: Judgement and insight appear normal. Mood & affect appropriate.    Data Reviewed: I have personally reviewed following labs and imaging studies  CBC: Recent Labs  Lab 09/14/23 0000 09/16/23 1857 09/17/23 0454  WBC 7.2 6.9 6.4  NEUTROABS 4.50  --   --   HGB 16.2 16.2 15.9  HCT 52 48.1 48.6  MCV  --  84.4 86.5  PLT 171 141* 133*   Basic Metabolic Panel: Recent Labs  Lab 09/14/23 0000 09/16/23 1857 09/17/23 0454  NA 144 140 140  K 6.0* 4.0 4.3  CL 102 105 103  CO2 22 24 27   GLUCOSE  --  103* 94  BUN 18 20 16   CREATININE 1.7* 1.57* 1.64*  CALCIUM 9.7 9.4 9.3  MG  --   --  2.1  PHOS  --   --  2.9   GFR: Estimated Creatinine Clearance: 43.2 mL/min (A) (by C-G formula based on SCr of 1.64 mg/dL (H)). Liver Function Tests: Recent Labs  Lab 09/14/23 0000 09/16/23 1857 09/17/23 0454  AST 27 189* 95*  ALT 21 121* 105*  ALKPHOS 81 92 85  BILITOT  --  1.2 1.4*  PROT  --  6.5  6.3*  ALBUMIN 4.7 3.8 3.6   Recent Labs  Lab 09/16/23 1857  LIPASE 3,853*   No results for input(s): "AMMONIA" in the last 168 hours. Coagulation Profile: No results for input(s): "INR", "PROTIME" in the last 168 hours. Cardiac Enzymes: No results for input(s): "CKTOTAL", "CKMB", "CKMBINDEX", "TROPONINI" in the last 168 hours. BNP (last 3 results) No results for input(s): "PROBNP" in the last 8760 hours. HbA1C: No results for input(s): "HGBA1C" in the last 72 hours. CBG: No results for input(s): "GLUCAP" in the last 168 hours. Lipid Profile: Recent Labs    09/17/23 0454  CHOL 122  HDL 36*  LDLCALC 61  TRIG 244  CHOLHDL 3.4   Thyroid Function Tests: No results for input(s): "TSH", "T4TOTAL", "FREET4", "T3FREE", "THYROIDAB" in the last 72 hours. Anemia Panel: No results for input(s): "VITAMINB12", "FOLATE", "FERRITIN", "  TIBC", "IRON", "RETICCTPCT" in the last 72 hours. Sepsis Labs: No results for input(s): "PROCALCITON", "LATICACIDVEN" in the last 168 hours.  No results found for this or any previous visit (from the past 240 hour(s)).   Radiology Studies: US Abdomen Limited RUQ (LIVER/GB)  Result Date: 09/17/2023 CLINICAL DATA:  Gallstone pancreatitis.  History of epigastric pain. EXAM: ULTRASOUND ABDOMEN LIMITED RIGHT UPPER QUADRANT COMPARISON:  CT abdomen 09/16/2023 FINDINGS: Gallbladder: Multiple layering echogenic stones in the gallbladder. Posterior acoustic shadowing associated with the gallstones. Gallbladder wall is borderline measuring approximately 3 mm. No sonographic Murphy sign. No pericholecystic fluid. Common bile duct: Diameter: 7 mm and similar to recent CT. Liver: Slightly increased echogenicity. Liver is difficult to visualize and appears to be mildly heterogeneous. Liver is better characterized on the recent CT. No discrete lesion identified. Portal vein is patent on color Doppler imaging with normal direction of blood flow towards the liver. No  intrahepatic biliary dilatation. Other: None. IMPRESSION: 1. Cholelithiasis without evidence for acute cholecystitis. 2. Common bile duct measures 7 mm and similar to recent CT. Electronically Signed   By: Richarda Overlie M.D.   On: 09/17/2023 10:03   CT ABDOMEN PELVIS W CONTRAST  Result Date: 09/16/2023 CLINICAL DATA:  Epigastric pain nausea vomiting EXAM: CT ABDOMEN AND PELVIS WITH CONTRAST TECHNIQUE: Multidetector CT imaging of the abdomen and pelvis was performed using the standard protocol following bolus administration of intravenous contrast. RADIATION DOSE REDUCTION: This exam was performed according to the departmental dose-optimization program which includes automated exposure control, adjustment of the mA and/or kV according to patient size and/or use of iterative reconstruction technique. CONTRAST:  OMNIPAQUE IOHEXOL 300 MG/ML  SOLN COMPARISON:  02/26/2022 FINDINGS: Lower chest: Lung bases are clear. Partially visualized cardiac pacing leads. Hepatobiliary: Subcentimeter hypodensities too small to further characterize. Gallstones. No biliary dilatation Pancreas: Unremarkable. No pancreatic ductal dilatation or surrounding inflammatory changes. Spleen: Normal in size without focal abnormality. Adrenals/Urinary Tract: Adrenal glands are unremarkable. Kidneys are normal, without renal calculi, focal lesion, or hydronephrosis. Bladder is unremarkable. Stomach/Bowel: Stomach is within normal limits. Appendix appears normal. No evidence of bowel wall thickening, distention, or inflammatory changes. Mild diverticular disease of the colon without acute inflammation Vascular/Lymphatic: Moderate aortic atherosclerosis. Aneurysmal dilatation of the distal infrarenal abdominal aorta measuring up to 3.1 cm, coronal series 601 image 91. Aneurysmal dilatation of left common iliac artery measuring 2.5 cm. Mild aneurysmal dilatation of the right proximal external iliac artery measuring up to 1.5 cm. No suspicious  lymph nodes Reproductive: Enlarged prostate Other: Negative for pelvic effusion or free air. Small fat containing inguinal hernias Musculoskeletal: No acute or suspicious osseous abnormality. IMPRESSION: 1. No CT evidence for acute intra-abdominal or pelvic abnormality. 2. Gallstones. 3. 3.1 cm infrarenal abdominal aortic aneurysm. Recommend follow-up ultrasound every 3 years. (Ref.: J Vasc Surg. 2018; 67:2-77 and J Am Coll Radiol 2013;10(10):789-794.) 4. Enlarged prostate. 5. Mild diverticular disease of the colon without acute inflammation. 6. Aortic atherosclerosis. Aortic Atherosclerosis (ICD10-I70.0). Electronically Signed   By: Jasmine Pang M.D.   On: 09/16/2023 21:19    Scheduled Meds:  amLODipine  5 mg Oral Daily   dofetilide  250 mcg Oral BID   levothyroxine  137 mcg Oral Q0600   losartan  50 mg Oral Daily   oxybutynin  10 mg Oral QHS   pantoprazole  40 mg Oral Daily   pregabalin  50 mg Oral QHS   tamsulosin  0.4 mg Oral QHS   Continuous Infusions:  LOS: 0 days   Hughie Closs, MD [facility]  09/17/2023, 11:19 AM   *Please note that this is a verbal dictation therefore any spelling or grammatical errors are due to the "Dragon [employer] One" system interpretation.  Please page via Amion and do not message via secure chat for urgent patient care matters. Secure chat can be used for non urgent patient care matters.  How to contact the [facility] Attending or Consulting provider 7A - 7P or covering provider during after hours 7P -7A, for this patient?  Check the care team in [facility] and look for a) attending/consulting [facility] provider listed and b) the [facility] team listed. Page or secure chat 7A-7P. Log into [email or web address] and use 's universal password to access. If you do not have the password, please contact the hospital operator. Locate the [facility] provider you are looking for under [facility] and page to a number that you can be directly reached. If you still have  difficulty reaching the provider, please page the [facility] (Director on Call) for the Hospitalists listed on amion for assistance.

## 2023-09-17 NOTE — Consult Note (Signed)
JATHNIEL POLINSKY 08-24-1946  [id number].    Requesting MD: Jacqulyn Bath, MD Chief Complaint/Reason for Consult: gallstone pancreatitis   HPI:  Gaylin Hasman is a 77 y/o M with a PMH HTN, HLD, complete heart block s/p pacemaker 2021, afib, PUD, chronic bronchitis, hypothyroidism, obesity, OSA, BPH, and rheumatoid arthritis who was admitted to [facility] for acute gallstone pancreatitis. He reports he began having epigastric abdominal pain early Thursday morning around 0200am.  He then developed some nausea and ultimately some emesis.  His pain seemed to improve slightly with eating a banana and drinking ginger tea.  When he ate potato soup for lunch yesterday, it returned.  His pain was severe in nature and so he presented to the ED for evaluation.  He was found to have gallstone pancreatitis.  His TB has been trending up slightly and is now 1.4, but his indirect bilirubin is elevated at 1.2.  his AST/ALT are mildly elevated, but improving from yesterday.  His US shows cholelithiasis without evidence of cholecystitis and a CBD of 7mm.  He has been admitted and we have been asked to see him.  Blood thinners: Xarelto, last dose Wednesday night, he believes Surgical history: bilateral inguinal hernia repair, umbilical hernia repair. Lower midline laparotomy with mesh Social history: former smoker, denies alcohol or drug use.  lives in [place] with his wife. At baseline he mobilizes without an assitive device, denies exertional chest pain.  Just had ablation for a fib in January 24 and has been feeling much better.  ROS: As above  Review of Systems  All other systems reviewed and are negative.   Family History  Problem Relation Age of Onset   CAD Mother 76       CABG   CAD Father 103       Died age 29   Diabetes Brother    Lung cancer Maternal Grandmother    Prostate cancer Neg Hx    Colon cancer Neg Hx     Past Medical History:  Diagnosis Date    Bronchitis    Diverticulosis    FH: BPH (benign prostatic hypertrophy)    History of Graves' disease 01/19/2018   S/p ablation   HTN (hypertension)    Hyperlipidemia    Obesity    Psoriatic arthritis (HCC)    Rheumatoid arthritis (HCC) 08/07/2014   Sleep apnea    CPAP   Thyroid disease     Past Surgical History:  Procedure Laterality Date   ABDOMINAL AORTOGRAM W/LOWER EXTREMITY N/A 01/14/2022   Procedure: ABDOMINAL AORTOGRAM W/LOWER EXTREMITY;  Surgeon: Iran Ouch, MD;  Location: [facility];  Service: Cardiovascular;  Laterality: N/A;   ATRIAL FIBRILLATION ABLATION N/A 12/24/2022   Procedure: ATRIAL FIBRILLATION ABLATION;  Surgeon: Maurice Small, MD;  Location: [facility];  Service: Cardiovascular;  Laterality: N/A;   INGUINAL HERNIA REPAIR     bilateral   KNEE SURGERY     bilateral arthroscopic   l foot surgery Left 12/2020   PACEMAKER IMPLANT N/A 12/13/2019   Procedure: PACEMAKER IMPLANT;  Surgeon: Marinus Maw, MD;  Location: [facility];  Service: Cardiovascular;  Laterality: N/A;   TRANSURETHRAL RESECTION OF PROSTATE     UMBILICAL HERNIA REPAIR      Social History:  reports that he has quit smoking. His smoking use included cigarettes. He has never used smokeless tobacco. He reports that he does not drink alcohol and  does not use drugs.  Allergies:  Allergies  Allergen Reactions   Codeine     Tension, nasty feeling    Nsaids Other (See Comments)    Renal insufficiency   Prednisone Other (See Comments)    Unknown/ sometimes takes with lower dose   Statins Other (See Comments)    Joint pain    Sulfa Antibiotics Other (See Comments)    Joint pain    Medications Prior to Admission  Medication Sig Dispense Refill   amLODipine (NORVASC) 5 MG tablet Take 1 tablet (5 mg total) by mouth daily. 90 tablet 3   cholecalciferol (VITAMIN D3) 25 MCG (1000 UNIT) tablet Take 2,000 Units by mouth daily in the afternoon.     Cyanocobalamin  (VITAMIN B 12) 500 MCG TABS Take 1,000 mcg by mouth daily in the afternoon.     diclofenac Sodium (VOLTAREN) 1 % GEL Apply 4 g topically in the morning and at bedtime. (Patient taking differently: Apply 4 g topically 2 (two) times daily as needed (knee pain.).) 100 g 6   dofetilide (TIKOSYN) 250 MCG capsule TAKE ONE CAPSULE BY MOUTH TWICE A DAY 180 capsule 3   Evolocumab (REPATHA SURECLICK) 140 MG/ML SOAJ Inject 140 mg into the skin every 14 (fourteen) days. 2 mL 11   ferrous fumarate (FERRETTS) 325 (106 Fe) MG TABS tablet Take 1 tablet (106 mg of iron total) by mouth 2 (two) times daily. (Patient taking differently: Take 1 tablet by mouth daily.) 180 tablet 1   fluticasone (FLONASE) 50 MCG/ACT nasal spray Place 1 spray into both nostrils as needed for allergies.     hydrOXYzine (VISTARIL) 25 MG capsule Take 1 capsule (25 mg total) by mouth every 8 (eight) hours as needed. 60 capsule 3   levothyroxine (SYNTHROID) 137 MCG tablet Take 1 tablet (137 mcg total) by mouth daily before breakfast. 90 tablet 1   losartan (COZAAR) 50 MG tablet TAKE ONE TABLET BY MOUTH EVERY MORNING AND TAKE ONE TABLET BY MOUTH AT BEDTIME 180 tablet 2   meclizine (ANTIVERT) 25 MG tablet Take 25 mg by mouth 3 (three) times daily as needed for dizziness.     Melatonin 5 MG TABS Take 5 mg by mouth at bedtime.     methocarbamol (ROBAXIN) 500 MG tablet Take 1 tablet (500 mg total) by mouth every 8 (eight) hours as needed for muscle spasms. 30 tablet 1   OVER THE COUNTER MEDICATION Take 2 capsules by mouth daily in the afternoon. Nervprin - Healthy Nerve Support - Peripheral Neuropathy Relief     oxybutynin (DITROPAN-XL) 10 MG 24 hr tablet Take 10 mg by mouth at bedtime.     pantoprazole (PROTONIX) 40 MG tablet Take 1 tablet (40 mg total) by mouth daily. 90 tablet 1   pregabalin (LYRICA) 50 MG capsule Take 50 mg by mouth at bedtime as needed.     rivaroxaban (XARELTO) 20 MG TABS tablet Take 1 tablet (20 mg total) by mouth daily with  supper. Dose change, please call [phone] 90 tablet 1   tadalafil (CIALIS) 20 MG tablet Take 20 mg by mouth daily as needed for erectile dysfunction.     tamsulosin (FLOMAX) 0.4 MG CAPS capsule Take 0.4 mg by mouth at bedtime.     triamcinolone cream (KENALOG) 0.1 % Apply 1 application topically daily as needed for itching.       Physical Exam: Blood pressure (!) 158/71, pulse 60, temperature 97.6 F (36.4 C), temperature source Oral, resp. rate 18, height 5'  10" (1.778 m), weight 96.2 kg, SpO2 100%. General: pleasant, obese, white male who is laying in bed in NAD HEENT: head is normocephalic, atraumatic.  Sclera are noninjected.  PERRL.  Ears and nose without any masses or lesions.  Mouth is pink and dry Heart: regular, rate, and rhythm.  Normal s1,s2. No obvious murmurs, gallops, or rubs noted.  Pacemaker in left upper chest. Lungs: CTAB, no wheezes, rhonchi, or rales noted.  Respiratory effort nonlabored Abd: soft, minimally tender in epigastrium, ND/obese, +BS, no masses, hernias.  Lower midline scar noted Psych: A&Ox3 with an appropriate affect.   Results for orders placed or performed during the hospital encounter of 09/16/23 (from the past 48 hour(s))  Lipase, blood     Status: Abnormal   Collection Time: 09/16/23  6:57 PM  Result Value Ref Range   Lipase 3,853 (H) 11 - 51 U/L    Comment: RESULTS CONFIRMED BY MANUAL DILUTION Performed at [facility], [address] [place], [place] [address]   Comprehensive metabolic panel     Status: Abnormal   Collection Time: 09/16/23  6:57 PM  Result Value Ref Range   Sodium 140 135 - 145 mmol/L   Potassium 4.0 3.5 - 5.1 mmol/L   Chloride 105 98 - 111 mmol/L   CO2 24 22 - 32 mmol/L   Glucose, Bld 103 (H) 70 - 99 mg/dL    Comment: Glucose reference range applies only to samples taken after fasting for at least 8 hours.   BUN 20 8 - 23 mg/dL   Creatinine, Ser 1.91 (H) 0.61 - 1.24 mg/dL   Calcium 9.4 8.9 - 47.8 mg/dL    Total Protein 6.5 6.5 - 8.1 g/dL   Albumin 3.8 3.5 - 5.0 g/dL   AST 295 (H) 15 - 41 U/L   ALT 121 (H) 0 - 44 U/L   Alkaline Phosphatase 92 38 - 126 U/L   Total Bilirubin 1.2 0.3 - 1.2 mg/dL   GFR, Estimated 45 (L) >60 mL/min    Comment: (NOTE) Calculated using the CKD-EPI Creatinine Equation (2021)    Anion gap 11 5 - 15    Comment: Performed at [facility], [address] [place], [place] [address]  CBC     Status: Abnormal   Collection Time: 09/16/23  6:57 PM  Result Value Ref Range   WBC 6.9 4.0 - 10.5 K/uL   RBC 5.70 4.22 - 5.81 MIL/uL   Hemoglobin 16.2 13.0 - 17.0 g/dL   HCT 86.5 78.4 - 69.6 %   MCV 84.4 80.0 - 100.0 fL   MCH 28.4 26.0 - 34.0 pg   MCHC 33.7 30.0 - 36.0 g/dL   RDW 29.5 28.4 - 13.2 %   Platelets 141 (L) 150 - 400 K/uL   nRBC 0.0 0.0 - 0.2 %    Comment: Performed at [facility], [address] [place], [place] [address]  Troponin I (High Sensitivity)     Status: None   Collection Time: 09/16/23  6:58 PM  Result Value Ref Range   Troponin I (High Sensitivity) 17 <18 ng/L    Comment: (NOTE) Elevated high sensitivity troponin I (hsTnI) values and significant  changes across serial measurements may suggest ACS but many other  chronic and acute conditions are known to elevate hsTnI results.  Refer to the "Links" section for chest pain algorithms and additional  guidance. Performed at [facility], [address] [place], [place] [address]   Basic metabolic panel     Status: Abnormal   Collection Time: 09/17/23  4:54 AM  Result Value Ref Range   Sodium 140 135 - 145 mmol/L   Potassium 4.3 3.5 - 5.1 mmol/L   Chloride 103 98 - 111 mmol/L   CO2 27 22 - 32 mmol/L   Glucose, Bld 94 70 - 99 mg/dL    Comment: Glucose reference range applies only to samples taken after fasting for at least 8 hours.   BUN 16 8 - 23 mg/dL   Creatinine, Ser 1.91 (H) 0.61 - 1.24 mg/dL   Calcium 9.3 8.9 - 47.8 mg/dL   GFR, Estimated 43 (L) >60  mL/min    Comment: (NOTE) Calculated using the CKD-EPI Creatinine Equation (2021)    Anion gap 10 5 - 15    Comment: Performed at [facility], [address], [place], [place] [address]  CBC     Status: Abnormal   Collection Time: 09/17/23  4:54 AM  Result Value Ref Range   WBC 6.4 4.0 - 10.5 K/uL   RBC 5.62 4.22 - 5.81 MIL/uL   Hemoglobin 15.9 13.0 - 17.0 g/dL   HCT 13.0 86.5 - 78.4 %   MCV 86.5 80.0 - 100.0 fL   MCH 28.3 26.0 - 34.0 pg   MCHC 32.7 30.0 - 36.0 g/dL   RDW 69.6 29.5 - 28.4 %   Platelets 133 (L) 150 - 400 K/uL    Comment: REPEATED TO VERIFY   nRBC 0.0 0.0 - 0.2 %    Comment: Performed at [facility], [address], [place], [place] [address]  Lipid panel     Status: Abnormal   Collection Time: 09/17/23  4:54 AM  Result Value Ref Range   Cholesterol 122 0 - 200 mg/dL   Triglycerides 010 <272 mg/dL   HDL 36 (L) >53 mg/dL   Total CHOL/HDL Ratio 3.4 RATIO   VLDL 25 0 - 40 mg/dL   LDL Cholesterol 61 0 - 99 mg/dL    Comment:        Total Cholesterol/HDL:CHD Risk Coronary Heart Disease Risk Table                     Men   Women  1/2 Average Risk   3.4   3.3  Average Risk       5.0   4.4  2 X Average Risk   9.6   7.1  3 X Average Risk  23.4   11.0        Use the calculated Patient Ratio above and the CHD Risk Table to determine the patient's CHD Risk.        ATP III CLASSIFICATION (LDL):  <100     mg/dL   Optimal  664-403  mg/dL   Near or Above                    Optimal  130-159  mg/dL   Borderline  474-259  mg/dL   High  >563     mg/dL   Very High Performed at [facility], [address], [place], [place] [address]   Magnesium     Status: None   Collection Time: 09/17/23  4:54 AM  Result Value Ref Range   Magnesium 2.1 1.7 - 2.4 mg/dL    Comment: Performed at [facility], [address], [place], [place] [address]  Phosphorus     Status: None   Collection Time: 09/17/23  4:54 AM  Result Value Ref Range   Phosphorus 2.9  2.5 - 4.6 mg/dL    Comment: Performed at [facility], [address] [place], [place] [address]  Hepatic function panel     Status: Abnormal   Collection Time: 09/17/23  4:54 AM  Result Value Ref Range   Total Protein 6.3 (L) 6.5 - 8.1 g/dL   Albumin 3.6 3.5 - 5.0 g/dL   AST 95 (H) 15 - 41 U/L   ALT 105 (H) 0 - 44 U/L   Alkaline Phosphatase 85 38 - 126 U/L   Total Bilirubin 1.4 (H) 0.3 - 1.2 mg/dL   Bilirubin, Direct 0.2 0.0 - 0.2 mg/dL   Indirect Bilirubin 1.2 (H) 0.3 - 0.9 mg/dL    Comment: Performed at [facility], [address] [place], [place] [address]  Ethanol     Status: None   Collection Time: 09/17/23  4:54 AM  Result Value Ref Range   Alcohol, Ethyl (B) <10 <10 mg/dL    Comment: (NOTE) Lowest detectable limit for serum alcohol is 10 mg/dL.  For medical purposes only. Performed at [facility], [address] [place], [place] [address]    US Abdomen Limited RUQ (LIVER/GB)  Result Date: 09/17/2023 CLINICAL DATA:  Gallstone pancreatitis.  History of epigastric pain. EXAM: ULTRASOUND ABDOMEN LIMITED RIGHT UPPER QUADRANT COMPARISON:  CT abdomen 09/16/2023 FINDINGS: Gallbladder: Multiple layering echogenic stones in the gallbladder. Posterior acoustic shadowing associated with the gallstones. Gallbladder wall is borderline measuring approximately 3 mm. No sonographic Murphy sign. No pericholecystic fluid. Common bile duct: Diameter: 7 mm and similar to recent CT. Liver: Slightly increased echogenicity. Liver is difficult to visualize and appears to be mildly heterogeneous. Liver is better characterized on the recent CT. No discrete lesion identified. Portal vein is patent on color Doppler imaging with normal direction of blood flow towards the liver. No intrahepatic biliary dilatation. Other: None. IMPRESSION: 1. Cholelithiasis without evidence for acute cholecystitis. 2. Common bile duct measures 7 mm and similar to recent CT. Electronically Signed   By:  Richarda Overlie M.D.   On: 09/17/2023 10:03   CT ABDOMEN PELVIS W CONTRAST  Result Date: 09/16/2023 CLINICAL DATA:  Epigastric pain nausea vomiting EXAM: CT ABDOMEN AND PELVIS WITH CONTRAST TECHNIQUE: Multidetector CT imaging of the abdomen and pelvis was performed using the standard protocol following bolus administration of intravenous contrast. RADIATION DOSE REDUCTION: This exam was performed according to the departmental dose-optimization program which includes automated exposure control, adjustment of the mA and/or kV according to patient size and/or use of iterative reconstruction technique. CONTRAST:  OMNIPAQUE IOHEXOL 300 MG/ML  SOLN COMPARISON:  02/26/2022 FINDINGS: Lower chest: Lung bases are clear. Partially visualized cardiac pacing leads. Hepatobiliary: Subcentimeter hypodensities too small to further characterize. Gallstones. No biliary dilatation Pancreas: Unremarkable. No pancreatic ductal dilatation or surrounding inflammatory changes. Spleen: Normal in size without focal abnormality. Adrenals/Urinary Tract: Adrenal glands are unremarkable. Kidneys are normal, without renal calculi, focal lesion, or hydronephrosis. Bladder is unremarkable. Stomach/Bowel: Stomach is within normal limits. Appendix appears normal. No evidence of bowel wall thickening, distention, or inflammatory changes. Mild diverticular disease of the colon without acute inflammation Vascular/Lymphatic: Moderate aortic atherosclerosis. Aneurysmal dilatation of the distal infrarenal abdominal aorta measuring up to 3.1 cm, coronal series 601 image 91. Aneurysmal dilatation of left common iliac artery measuring 2.5 cm. Mild aneurysmal dilatation of the right  proximal external iliac artery measuring up to 1.5 cm. No suspicious lymph nodes Reproductive: Enlarged prostate Other: Negative for pelvic effusion or free air. Small fat containing inguinal hernias Musculoskeletal: No acute or suspicious osseous abnormality. IMPRESSION: 1.  No CT evidence for acute intra-abdominal or pelvic abnormality. 2. Gallstones. 3. 3.1 cm infrarenal abdominal aortic aneurysm. Recommend follow-up ultrasound every 3 years. (Ref.: J Vasc Surg. 2018; 67:2-77 and J Am Coll Radiol 2013;10(10):789-794.) 4. Enlarged prostate. 5. Mild diverticular disease of the colon without acute inflammation. 6. Aortic atherosclerosis. Aortic Atherosclerosis (ICD10-I70.0). Electronically Signed   By: Jasmine Pang M.D.   On: 09/16/2023 21:19      Assessment/Plan Acute gallstone pancreatitis  - CT abdomen pelvis 10/10 w/ gallstones, small subcentimeter liver hypodensities, and no biliary dilation. Pancreas appears unremarkable. - RUQ U/S today w/ cholelithiasis without evidence of cholecystitis  - AST 95, ALT 105, alk phos 85, bili 1.4; unable to perform MRCP due to patient pacemaker so trend LFTs. No current evidence of obstructing choledocholithiasis, but TB slightly up today to 1.4.  labs in am. - lipase 3,853 yesterday  - lipid panel unremarkable  - the patients history, labs, and imaging have all been reviewed by me personally. I agree that his symptoms are consistent with gallstone pancreatitis. Pending improvement in his pancreatitis, I recommend laparoscopic cholecystectomy with IOC this admission.  Will ask cardiology to consult for perioperative risk stratification and optimization, but suspect he is overall fairly stable. -possible OR tomorrow pending cards clearance, OR availability, his labs, and his clinical exam.  I did explain to the patient and his wife that on the weekend's availability can be more difficult and timing may get adjusted.  FEN - CLD, NPO p MN, IVF  VTE - SCD's, Hold Xarelto, ok for heparin gtt if warranted. ID - none indicated from a general surgery perspective  Admit - TRH service   HTN HLD Complete heart block s/p pacement 2021 Afib s/p ablation on xarelto at home OSA Hypothyroidism RA Obesity   I reviewed hospitalist notes,  last 24 h vitals and pain scores, last 48 h intake and output, last 24 h labs and trends, and last 24 h imaging results.  Letha Cape, PA-C [facility] 09/17/2023, 1:40 PM Please see Amion for pager number during day hours 7:00am-4:30pm or 7:00am -11:30am on weekends

## 2023-09-17 NOTE — H&P (Signed)
History and Physical    Martin Rubio YQM:[id number] DOB: Mar 20, 1946 DOA: 09/16/2023  PCP: Wanda Plump, MD   Patient coming from:  Transfer from [facility] ED    Chief Complaint:  Chief Complaint  Patient presents with   Abdominal Pain    HPI:  Martin Rubio is a 77 y.o. male with hx of complete heart block s/p Medtronic dual-chamber pacemaker placement in 2021, atrial fibrillation s/p ablation 12/24/2022, PUD, chronic bronchitis, BPH, Graves' disease status post ablation, hypertension, hyperlipidemia, obesity, psoriatic arthritis, rheumatoid arthritis, sleep apnea on CPAP, was transferred from [facility] ED for acute pancreatitis.  Although he focuses on worsening symptoms in the past day-  Reveals 1 year history of colicky epigastric pain with fatty food, this has been especially worse in the past few weeks.  Then 1 day prior to admission was woke from sleep at 2 AM with severe epigastric pain.  Later that same day he ate lunch with a fatty potato soup and had severe exacerbation of his pain.  Overall has had constant pain since last night at 2 AM.  Denies any fevers, chills.  He has nausea, spitting of saliva.  Did have an episode of vomiting yesterday.  Did not have any known history of gallstones in the past.    Review of Systems:  ROS complete and negative except as marked above   Allergies  Allergen Reactions   Codeine     Tension, nasty feeling    Nsaids Other (See Comments)    Renal insufficiency   Prednisone Other (See Comments)    Unknown/ sometimes takes with lower dose   Statins Other (See Comments)    Joint pain    Sulfa Antibiotics Other (See Comments)    Joint pain    Prior to Admission medications   Medication Sig Start Date End Date Taking? Authorizing Provider  amLODipine (NORVASC) 5 MG tablet Take 1 tablet (5 mg total) by mouth daily. 03/09/23  Yes Rollene Rotunda, MD  cholecalciferol (VITAMIN D3) 25 MCG (1000 UNIT) tablet Take 2,000 Units by mouth  daily in the afternoon.   Yes [provider]  Cyanocobalamin (VITAMIN B 12) 500 MCG TABS Take 1,000 mcg by mouth daily in the afternoon. 02/29/20  Yes Paz, Nolon Rod, MD  dofetilide (TIKOSYN) 250 MCG capsule TAKE ONE CAPSULE BY MOUTH TWICE A DAY 09/25/22  Yes Rollene Rotunda, MD  ferrous fumarate (FERRETTS) 325 (106 Fe) MG TABS tablet Take 1 tablet (106 mg of iron total) by mouth 2 (two) times daily. 08/30/23  Yes Paz, Nolon Rod, MD  acetaminophen (TYLENOL) 500 MG tablet Take 500 mg by mouth every 6 (six) hours as needed (pain.).    [provider]  diclofenac Sodium (VOLTAREN) 1 % GEL Apply 4 g topically in the morning and at bedtime. Patient taking differently: Apply 4 g topically 2 (two) times daily as needed (knee pain.). 12/16/21   Wanda Plump, MD  ENBREL SURECLICK 50 MG/ML injection Inject 50 mg into the skin once a week. Friday or Saturday 12/21/22   [provider]  Evolocumab (REPATHA SURECLICK) 140 MG/ML SOAJ Inject 140 mg into the skin every 14 (fourteen) days. 03/15/23   Iran Ouch, MD  fluticasone (FLONASE) 50 MCG/ACT nasal spray Place 1 spray into both nostrils as needed for allergies.    [provider]  hydrOXYzine (VISTARIL) 25 MG capsule Take 1 capsule (25 mg total) by mouth every 8 (eight) hours as needed. 08/02/23  Wanda Plump, MD  [employer] [address] [place] CAPS Take 350 mg by mouth every Monday, Tuesday, Wednesday, Thursday, and Friday. Patient not taking: Reported on 09/13/2023    [provider]  levothyroxine (SYNTHROID) 137 MCG tablet Take 1 tablet (137 mcg total) by mouth daily before breakfast. 08/30/23   Wanda Plump, MD  losartan (COZAAR) 50 MG tablet TAKE ONE TABLET BY MOUTH EVERY MORNING AND TAKE ONE TABLET BY MOUTH AT BEDTIME 06/14/23   Iran Ouch, MD  meclizine (ANTIVERT) 25 MG tablet Take 25 mg by mouth 3 (three) times daily as needed for dizziness. 01/19/18   [provider]  Melatonin 5 MG TABS Take 5 mg by mouth at  bedtime.    [provider]  methocarbamol (ROBAXIN) 500 MG tablet Take 1 tablet (500 mg total) by mouth every 8 (eight) hours as needed for muscle spasms. 05/11/23   Hudnall, Azucena Fallen, MD  OVER THE COUNTER MEDICATION Take 2 capsules by mouth daily in the afternoon. Nervprin - Healthy Nerve Support - Peripheral Neuropathy Relief    [provider]  oxybutynin (DITROPAN-XL) 10 MG 24 hr tablet Take 10 mg by mouth at bedtime. 11/04/22   [provider]  pantoprazole (PROTONIX) 40 MG tablet Take 1 tablet (40 mg total) by mouth daily. 06/07/23   Wanda Plump, MD  pregabalin (LYRICA) 50 MG capsule Take 50 mg by mouth at bedtime. 04/17/22   [provider]  rivaroxaban (XARELTO) 20 MG TABS tablet Take 1 tablet (20 mg total) by mouth daily with supper. Dose change, please call [phone] 06/08/23   Mealor, Roberts Gaudy, MD  tadalafil (CIALIS) 20 MG tablet Take 20 mg by mouth daily as needed for erectile dysfunction. 10/01/22   [provider]  tamsulosin (FLOMAX) 0.4 MG CAPS capsule Take 0.4 mg by mouth at bedtime. 03/01/19   [provider]  triamcinolone cream (KENALOG) 0.1 % Apply 1 application topically daily as needed for itching. 08/22/19   [provider]    Past Medical History:  Diagnosis Date   Bronchitis    Diverticulosis    FH: BPH (benign prostatic hypertrophy)    History of Graves' disease 01/19/2018   S/p ablation   HTN (hypertension)    Hyperlipidemia    Obesity    Psoriatic arthritis (HCC)    Rheumatoid arthritis (HCC) 08/07/2014   Sleep apnea    CPAP   Thyroid disease     Past Surgical History:  Procedure Laterality Date   ABDOMINAL AORTOGRAM W/LOWER EXTREMITY N/A 01/14/2022   Procedure: ABDOMINAL AORTOGRAM W/LOWER EXTREMITY;  Surgeon: Iran Ouch, MD;  Location: [facility] INVASIVE CV LAB;  Service: Cardiovascular;  Laterality: N/A;   ATRIAL FIBRILLATION ABLATION N/A 12/24/2022   Procedure: ATRIAL FIBRILLATION ABLATION;   Surgeon: Maurice Small, MD;  Location: [facility] INVASIVE CV LAB;  Service: Cardiovascular;  Laterality: N/A;   INGUINAL HERNIA REPAIR     bilateral   KNEE SURGERY     bilateral arthroscopic   l foot surgery Left 12/2020   PACEMAKER IMPLANT N/A 12/13/2019   Procedure: PACEMAKER IMPLANT;  Surgeon: Marinus Maw, MD;  Location: [facility] INVASIVE CV LAB;  Service: Cardiovascular;  Laterality: N/A;   TRANSURETHRAL RESECTION OF PROSTATE     UMBILICAL HERNIA REPAIR       reports that he has quit smoking. His smoking use included cigarettes. He has never used smokeless tobacco. He reports that he does not drink alcohol and does not use drugs.  Family History  Problem Relation Age of Onset   CAD Mother 83       CABG   CAD Father 63       Died age 43   Diabetes Brother    Lung cancer Maternal Grandmother    Prostate cancer Neg Hx    Colon cancer Neg Hx      Physical Exam: Vitals:   09/17/23 0130 09/17/23 0200 09/17/23 0201 09/17/23 0305  BP: (!) 146/79 (!) 145/77  (!) 186/82  Pulse: (!) 59 (!) 59 (!) 59 (!) 59  Resp:  15  18  Temp:  98.5 F (36.9 C)  (!) 97.4 F (36.3 C)  TempSrc:      SpO2: 91% 90% 92% 99%  Weight:    96.2 kg  Height:        Gen: Awake, alert, NAD, well appearing.  CV: Regular, normal S1, S2,  Resp: Normal WOB, CTAB  Abd: Flat, normoactive, with moderate epigastric tenderness, negative murphys, no rebound, guarding or rigidity.  MSK: Symmetric, no edema  Skin: No rashes or lesions to exposed skin  Neuro: Alert and interactive  Psych: euthymic, appropriate    Data review:   Labs reviewed, notable for:   Lipase 3853 T. bili 1.2, AST 189, ALT 121, wbc within normal limit EtOH negative Triglycerides within normal limit  Micro:  Results for orders placed or performed in visit on 01/31/21  Urine Culture     Status: None   Collection Time: 01/31/21  4:28 PM   Specimen: Urine  Result Value Ref Range Status   MICRO NUMBER: [id number]  Final   SPECIMEN  QUALITY: Adequate  Final   Sample Source NOT GIVEN  Final   STATUS: FINAL  Final   Result: No Growth  Final    Imaging reviewed:  CT ABDOMEN PELVIS W CONTRAST  Result Date: 09/16/2023 CLINICAL DATA:  Epigastric pain nausea vomiting EXAM: CT ABDOMEN AND PELVIS WITH CONTRAST TECHNIQUE: Multidetector CT imaging of the abdomen and pelvis was performed using the standard protocol following bolus administration of intravenous contrast. RADIATION DOSE REDUCTION: This exam was performed according to the departmental dose-optimization program which includes automated exposure control, adjustment of the mA and/or kV according to patient size and/or use of iterative reconstruction technique. CONTRAST:  OMNIPAQUE IOHEXOL 300 MG/ML  SOLN COMPARISON:  02/26/2022 FINDINGS: Lower chest: Lung bases are clear. Partially visualized cardiac pacing leads. Hepatobiliary: Subcentimeter hypodensities too small to further characterize. Gallstones. No biliary dilatation Pancreas: Unremarkable. No pancreatic ductal dilatation or surrounding inflammatory changes. Spleen: Normal in size without focal abnormality. Adrenals/Urinary Tract: Adrenal glands are unremarkable. Kidneys are normal, without renal calculi, focal lesion, or hydronephrosis. Bladder is unremarkable. Stomach/Bowel: Stomach is within normal limits. Appendix appears normal. No evidence of bowel wall thickening, distention, or inflammatory changes. Mild diverticular disease of the colon without acute inflammation Vascular/Lymphatic: Moderate aortic atherosclerosis. Aneurysmal dilatation of the distal infrarenal abdominal aorta measuring up to 3.1 cm, coronal series 601 image 91. Aneurysmal dilatation of left common iliac artery measuring 2.5 cm. Mild aneurysmal dilatation of the right proximal external iliac artery measuring up to 1.5 cm. No suspicious lymph nodes Reproductive: Enlarged prostate Other: Negative for pelvic effusion or free air. Small fat containing  inguinal hernias Musculoskeletal: No acute or suspicious osseous abnormality. IMPRESSION: 1. No CT evidence for acute intra-abdominal or pelvic abnormality. 2. Gallstones. 3. 3.1 cm infrarenal abdominal aortic aneurysm. Recommend follow-up ultrasound every 3 years. (Ref.: J Vasc Surg. 2018; 67:2-77 and J Am  Coll Radiol 2013;10(10):789-794.) 4. Enlarged prostate. 5. Mild diverticular disease of the colon without acute inflammation. 6. Aortic atherosclerosis. Aortic Atherosclerosis (ICD10-I70.0). Electronically Signed   By: Jasmine Pang M.D.   On: 09/16/2023 21:19      ED Course:  CT obtained at [facility].  Treated with morphine, Zofran   Assessment/Plan:  77 y.o. male with hx complete heart block s/p Medtronic dual-chamber pacemaker placement in 2021, atrial fibrillation s/p ablation 12/24/2022, PUD, chronic bronchitis, BPH, Graves' disease status post ablation, hypertension, hyperlipidemia, obesity, psoriatic arthritis, rheumatoid arthritis, sleep apnea on CPAP, was transferred from [facility] ED for acute pancreatitis, suspect secondary to gallstone  Acute pancreatitis, suspect gallstone related Acute liver injury History of chronic symptoms of biliary colic which progressed recently.  Then suddenly worse epigastric pain although no radiation consistent with pancreatitis.  Lipase elevated at 3853.  CT demonstrating gallstones, no biliary dilation or changes of the pancreas. associated liver injury with elevated transaminases, and CT with multiple liver hypodensities too small to characterize. Suspect passed choledocholithiasis.  -Check right upper quadrant ultrasound -If worsening liver injury consider MRCP to further evaluate multiple liver hypodensities/ distal CBD  -1 L LR, continue to 100 cc for 5 hours, reevaluate need for IV fluids thereafter.  -No indication for abx at this time  -Feels up to taking in clear liquid diet -Pain management: Oxycodone 2.5 mg / 5 mg every  4 hours as needed for moderate/severe, Dilaudid 0.5 mg IV for breakthrough or unable to take p.o. -General Surgery consult in morning to guide timing of cholecystectomy -Note tentatively have continued his home Xarelto  Chronic medical problems: History of heart block, pacer A-fib status post ablation: Continue home dofetilide, Xarelto History of peptic ulcer disease: Continue home PPI Hypertension: Continues on losartan, amlodipine HLD: on repatha OP  Hypothyroid: Continues on levothyroxine BPH: Held his home oxybutynin while inpatient.  Continue tamsulosin  Body mass index is 30.43 kg/m.  ; Obesity class 1 affecting medical care   DVT prophylaxis:  Xarelto Code Status:  Full Code; note initially reports that he has a DNR on file prn but then during discussion of CODE STATUS reports that he would like to be full code.   Diet:  Diet Orders (From admission, onward)     Start     Ordered   09/17/23 0419  Diet clear liquid Room service appropriate? Yes; Fluid consistency: Thin  Diet effective now       Question Answer Comment  Room service appropriate? Yes   Fluid consistency: Thin      09/17/23 0419           Family Communication: No Consults: General Surgery consult in a.m. Admission status:   Inpatient, Med-Surg  Severity of Illness: The appropriate patient status for this patient is INPATIENT. Inpatient status is judged to be reasonable and necessary in order to provide the required intensity of service to ensure the patient's safety. The patient's presenting symptoms, physical exam findings, and initial radiographic and laboratory data in the context of their chronic comorbidities is felt to place them at high risk for further clinical deterioration. Furthermore, it is not anticipated that the patient will be medically stable for discharge from the hospital within 2 midnights of admission.   * I certify that at the point of admission it is my clinical judgment that the  patient will require inpatient hospital care spanning beyond 2 midnights from the point of admission due to high intensity of  service, high risk for further deterioration and high frequency of surveillance required.*   Dolly Rias, MD [facility]  How to contact the [facility] Attending or Consulting provider 7A - 7P or covering provider during after hours 7P -7A, for this patient.  Check the care team in [facility] and look for a) attending/consulting [facility] provider listed and b) the [facility] team listed Log into [email or web address] and use [place]'s universal password to access. If you do not have the password, please contact the hospital operator. Locate the [facility] provider you are looking for under [facility] and page to a number that you can be directly reached. If you still have difficulty reaching the provider, please page the [facility] (Director on Call) for the Hospitalists listed on amion for assistance.  09/17/2023, 6:39 AM

## 2023-09-18 ENCOUNTER — Inpatient Hospital Stay (HOSPITAL_COMMUNITY): Payer: Medicare Other | Admitting: Certified Registered Nurse Anesthetist

## 2023-09-18 ENCOUNTER — Encounter (HOSPITAL_COMMUNITY): Payer: Self-pay | Admitting: Internal Medicine

## 2023-09-18 ENCOUNTER — Encounter (HOSPITAL_COMMUNITY)
Admission: EM | Disposition: A | Discharge status: Home / Self Care | Payer: Self-pay | Source: Home / Self Care | Attending: Family Medicine

## 2023-09-18 ENCOUNTER — Other Ambulatory Visit: Payer: Self-pay

## 2023-09-18 ENCOUNTER — Inpatient Hospital Stay (HOSPITAL_COMMUNITY): Payer: Medicare Other

## 2023-09-18 DIAGNOSIS — I4891 Unspecified atrial fibrillation: Secondary | ICD-10-CM | POA: Diagnosis not present

## 2023-09-18 DIAGNOSIS — K810 Acute cholecystitis: Secondary | ICD-10-CM | POA: Diagnosis not present

## 2023-09-18 DIAGNOSIS — K851 Biliary acute pancreatitis without necrosis or infection: Secondary | ICD-10-CM | POA: Diagnosis not present

## 2023-09-18 HISTORY — PX: CHOLECYSTECTOMY: SHX55

## 2023-09-18 LAB — CBC WITH DIFFERENTIAL/PLATELET
Abs Immature Granulocytes: 0.02 10*3/uL (ref 0.00–0.07)
Basophils Absolute: 0 10*3/uL (ref 0.0–0.1)
Basophils Relative: 0 %
Eosinophils Absolute: 0.5 10*3/uL (ref 0.0–0.5)
Eosinophils Relative: 7 %
HCT: 46.7 % (ref 39.0–52.0)
Hemoglobin: 15 g/dL (ref 13.0–17.0)
Immature Granulocytes: 0 %
Lymphocytes Relative: 25 %
Lymphs Abs: 1.9 10*3/uL (ref 0.7–4.0)
MCH: 27.2 pg (ref 26.0–34.0)
MCHC: 32.1 g/dL (ref 30.0–36.0)
MCV: 84.8 fL (ref 80.0–100.0)
Monocytes Absolute: 0.9 10*3/uL (ref 0.1–1.0)
Monocytes Relative: 12 %
Neutro Abs: 4.1 10*3/uL (ref 1.7–7.7)
Neutrophils Relative %: 56 %
Platelets: 149 10*3/uL — ABNORMAL LOW (ref 150–400)
RBC: 5.51 MIL/uL (ref 4.22–5.81)
RDW: 13.5 % (ref 11.5–15.5)
WBC: 7.3 10*3/uL (ref 4.0–10.5)
nRBC: 0 % (ref 0.0–0.2)

## 2023-09-18 LAB — COMPREHENSIVE METABOLIC PANEL
ALT: 72 U/L — ABNORMAL HIGH (ref 0–44)
AST: 44 U/L — ABNORMAL HIGH (ref 15–41)
Albumin: 3.3 g/dL — ABNORMAL LOW (ref 3.5–5.0)
Alkaline Phosphatase: 77 U/L (ref 38–126)
Anion gap: 6 (ref 5–15)
BUN: 12 mg/dL (ref 8–23)
CO2: 29 mmol/L (ref 22–32)
Calcium: 9.1 mg/dL (ref 8.9–10.3)
Chloride: 104 mmol/L (ref 98–111)
Creatinine, Ser: 1.74 mg/dL — ABNORMAL HIGH (ref 0.61–1.24)
GFR, Estimated: 40 mL/min — ABNORMAL LOW (ref >60)
Glucose, Bld: 95 mg/dL (ref 70–99)
Potassium: 4.3 mmol/L (ref 3.5–5.1)
Sodium: 139 mmol/L (ref 135–145)
Total Bilirubin: 1.1 mg/dL (ref 0.3–1.2)
Total Protein: 5.9 g/dL — ABNORMAL LOW (ref 6.5–8.1)

## 2023-09-18 LAB — LIPASE, BLOOD: Lipase: 110 U/L — ABNORMAL HIGH (ref 11–51)

## 2023-09-18 SURGERY — LAPAROSCOPIC CHOLECYSTECTOMY WITH INTRAOPERATIVE CHOLANGIOGRAM
Anesthesia: General

## 2023-09-18 MED ORDER — CHLORHEXIDINE GLUCONATE 0.12 % MT SOLN
OROMUCOSAL | Status: AC
  Administered 2023-09-18: 15 mL via OROMUCOSAL
  Filled 2023-09-18: qty 15

## 2023-09-18 MED ORDER — SODIUM CHLORIDE 0.9 % IV SOLN
INTRAVENOUS | Status: DC | PRN
  Administered 2023-09-18: 5 mL

## 2023-09-18 MED ORDER — ORAL CARE MOUTH RINSE: 15.0000 mL | Freq: Once | OROMUCOSAL | Status: AC

## 2023-09-18 MED ORDER — CEFAZOLIN SODIUM-DEXTROSE 2-4 GM/100ML-% IV SOLN
2.0000 g | INTRAVENOUS | Status: AC
  Administered 2023-09-18: 2 g via INTRAVENOUS

## 2023-09-18 MED ORDER — SODIUM CHLORIDE 0.9 % IV SOLN
INTRAVENOUS | Status: DC | PRN
Start: 2023-09-18 — End: 2023-09-18

## 2023-09-18 MED ORDER — OXYCODONE HCL 5 MG PO TABS
5.0000 mg | ORAL_TABLET | Freq: Once | ORAL | Status: AC | PRN
  Administered 2023-09-18: 5 mg via ORAL

## 2023-09-18 MED ORDER — PHENYLEPHRINE 80 MCG/ML (10ML) SYRINGE FOR IV PUSH (FOR BLOOD PRESSURE SUPPORT)
PREFILLED_SYRINGE | INTRAVENOUS | Status: DC | PRN
  Administered 2023-09-18: 80 ug via INTRAVENOUS

## 2023-09-18 MED ORDER — LACTATED RINGERS IV SOLN
INTRAVENOUS | Status: DC | PRN
Start: 2023-09-18 — End: 2023-09-18

## 2023-09-18 MED ORDER — SUGAMMADEX SODIUM 200 MG/2ML IV SOLN
INTRAVENOUS | Status: DC | PRN
  Administered 2023-09-18: 200 mg via INTRAVENOUS

## 2023-09-18 MED ORDER — SODIUM CHLORIDE 0.9% FLUSH
10.0000 mL | Freq: Two times a day (BID) | INTRAVENOUS | Status: DC
  Administered 2023-09-18: 3 mL via INTRAVENOUS
  Administered 2023-09-19: 10 mL via INTRAVENOUS

## 2023-09-18 MED ORDER — DEXAMETHASONE SODIUM PHOSPHATE 10 MG/ML IJ SOLN
INTRAMUSCULAR | Status: DC | PRN
  Administered 2023-09-18: 8 mg via INTRAVENOUS

## 2023-09-18 MED ORDER — FENTANYL CITRATE (PF) 250 MCG/5ML IJ SOLN
INTRAMUSCULAR | Status: DC | PRN
  Administered 2023-09-18 (×5): 50 ug via INTRAVENOUS

## 2023-09-18 MED ORDER — CHLORHEXIDINE GLUCONATE 0.12 % MT SOLN: 15.0000 mL | Freq: Once | OROMUCOSAL | Status: AC

## 2023-09-18 MED ORDER — PROPOFOL 10 MG/ML IV BOLUS
INTRAVENOUS | Status: DC | PRN
  Administered 2023-09-18: 150 mg via INTRAVENOUS

## 2023-09-18 MED ORDER — ACETAMINOPHEN 10 MG/ML IV SOLN
1000.0000 mg | Freq: Once | INTRAVENOUS | Status: DC | PRN
  Administered 2023-09-18: 1000 mg via INTRAVENOUS

## 2023-09-18 MED ORDER — ACETAMINOPHEN 325 MG PO TABS: 325.0000 mg | ORAL_TABLET | ORAL | Status: DC | PRN

## 2023-09-18 MED ORDER — FENTANYL CITRATE (PF) 100 MCG/2ML IJ SOLN
25.0000 ug | INTRAMUSCULAR | Status: DC | PRN
  Administered 2023-09-18 (×3): 50 ug via INTRAVENOUS

## 2023-09-18 MED ORDER — CEFAZOLIN SODIUM-DEXTROSE 2-4 GM/100ML-% IV SOLN
INTRAVENOUS | Status: AC
  Filled 2023-09-18: qty 100

## 2023-09-18 MED ORDER — STERILE WATER FOR IRRIGATION IR SOLN
Status: DC | PRN
Start: 2023-09-18 — End: 2023-09-18
  Administered 2023-09-18: 1000 mL

## 2023-09-18 MED ORDER — ONDANSETRON HCL 4 MG/2ML IJ SOLN
INTRAMUSCULAR | Status: DC | PRN
  Administered 2023-09-18: 4 mg via INTRAVENOUS

## 2023-09-18 MED ORDER — FENTANYL CITRATE (PF) 100 MCG/2ML IJ SOLN
INTRAMUSCULAR | Status: AC
  Filled 2023-09-18: qty 2

## 2023-09-18 MED ORDER — DROPERIDOL 2.5 MG/ML IJ SOLN: 0.6250 mg | Freq: Once | INTRAMUSCULAR | Status: DC | PRN

## 2023-09-18 MED ORDER — FENTANYL CITRATE (PF) 250 MCG/5ML IJ SOLN
INTRAMUSCULAR | Status: AC
  Filled 2023-09-18: qty 5

## 2023-09-18 MED ORDER — ACETAMINOPHEN 160 MG/5ML PO SOLN: 325.0000 mg | ORAL | Status: DC | PRN

## 2023-09-18 MED ORDER — PROPOFOL 10 MG/ML IV BOLUS
INTRAVENOUS | Status: AC
  Filled 2023-09-18: qty 20

## 2023-09-18 MED ORDER — ROCURONIUM BROMIDE 10 MG/ML (PF) SYRINGE
PREFILLED_SYRINGE | INTRAVENOUS | Status: DC | PRN
  Administered 2023-09-18: 50 mg via INTRAVENOUS

## 2023-09-18 MED ORDER — SODIUM CHLORIDE 0.9 % IV SOLN: INTRAVENOUS | Status: DC

## 2023-09-18 MED ORDER — BUPIVACAINE HCL (PF) 0.25 % IJ SOLN
INTRAMUSCULAR | Status: AC
  Filled 2023-09-18: qty 30

## 2023-09-18 MED ORDER — LIDOCAINE HCL (CARDIAC) PF 100 MG/5ML IV SOSY
PREFILLED_SYRINGE | INTRAVENOUS | Status: DC | PRN
  Administered 2023-09-18: 40 mg via INTRAVENOUS

## 2023-09-18 MED ORDER — OXYCODONE HCL 5 MG PO TABS
ORAL_TABLET | ORAL | Status: AC
  Filled 2023-09-18: qty 1

## 2023-09-18 MED ORDER — BUPIVACAINE HCL 0.25 % IJ SOLN
INTRAMUSCULAR | Status: DC | PRN
  Administered 2023-09-18: 30 mL

## 2023-09-18 MED ORDER — ACETAMINOPHEN 10 MG/ML IV SOLN
INTRAVENOUS | Status: AC
  Filled 2023-09-18: qty 100

## 2023-09-18 MED ORDER — OXYCODONE HCL 5 MG/5ML PO SOLN: 5.0000 mg | Freq: Once | ORAL | Status: AC | PRN

## 2023-09-18 MED ORDER — 0.9 % SODIUM CHLORIDE (POUR BTL) OPTIME
TOPICAL | Status: DC | PRN
  Administered 2023-09-18: 1000 mL

## 2023-09-18 SURGICAL SUPPLY — 48 items
ADH SKN CLS APL DERMABOND .7 (GAUZE/BANDAGES/DRESSINGS) ×1
APL PRP STRL LF DISP 70% ISPRP (MISCELLANEOUS) ×1
APPLIER CLIP ROT 10 11.4 M/L (STAPLE)
APR CLP MED LRG 11.4X10 (STAPLE)
BAG COUNTER SPONGE SURGICOUNT (BAG) ×1 IMPLANT
BAG SPEC RTRVL 10 TROC 200 (ENDOMECHANICALS) ×1
BAG SPNG CNTER NS LX DISP (BAG) ×1
BLADE CLIPPER SURG (BLADE) IMPLANT
CANISTER SUCT 3000ML PPV (MISCELLANEOUS) ×1 IMPLANT
CATH CHOLANG 76X19 KUMAR (CATHETERS) IMPLANT
CHLORAPREP W/TINT 26 (MISCELLANEOUS) ×1 IMPLANT
CLIP APPLIE ROT 10 11.4 M/L (STAPLE) IMPLANT
CLIP LIGATING HEMO O LOK GREEN (MISCELLANEOUS) IMPLANT
COVER MAYO STAND STRL (DRAPES) ×1 IMPLANT
COVER SURGICAL LIGHT HANDLE (MISCELLANEOUS) ×1 IMPLANT
DERMABOND ADVANCED .7 DNX12 (GAUZE/BANDAGES/DRESSINGS) ×1 IMPLANT
DRAPE C-ARM 42X120 X-RAY (DRAPES) ×1 IMPLANT
ELECT REM PT RETURN 9FT ADLT (ELECTROSURGICAL) ×1
ELECTRODE REM PT RTRN 9FT ADLT (ELECTROSURGICAL) ×1 IMPLANT
GLOVE BIOGEL PI IND STRL 7.0 (GLOVE) ×1 IMPLANT
GLOVE SURG SS PI 7.0 STRL IVOR (GLOVE) ×1 IMPLANT
GOWN STRL REUS W/ TWL LRG LVL3 (GOWN DISPOSABLE) ×3 IMPLANT
GOWN STRL REUS W/TWL LRG LVL3 (GOWN DISPOSABLE) ×3
GRASPER SUT TROCAR 14GX15 (MISCELLANEOUS) ×1 IMPLANT
IRRIG SUCT STRYKERFLOW 2 WTIP (MISCELLANEOUS) ×1
IRRIGATION SUCT STRKRFLW 2 WTP (MISCELLANEOUS) ×1 IMPLANT
KIT BASIN OR (CUSTOM PROCEDURE TRAY) ×1 IMPLANT
KIT IMAGING PINPOINTPAQ (MISCELLANEOUS) IMPLANT
KIT TURNOVER KIT B (KITS) ×1 IMPLANT
NDL 22X1.5 STRL (OR ONLY) (MISCELLANEOUS) ×1 IMPLANT
NEEDLE 22X1.5 STRL (OR ONLY) (MISCELLANEOUS) ×1 IMPLANT
NS IRRIG 1000ML POUR BTL (IV SOLUTION) ×1 IMPLANT
PAD ARMBOARD 7.5X6 YLW CONV (MISCELLANEOUS) ×1 IMPLANT
POUCH RETRIEVAL ECOSAC 10 (ENDOMECHANICALS) ×1 IMPLANT
SCISSORS LAP 5X35 DISP (ENDOMECHANICALS) ×1 IMPLANT
SET CHOLANGIOGRAPH 5 50 .035 (SET/KITS/TRAYS/PACK) ×1 IMPLANT
SET TUBE SMOKE EVAC HIGH FLOW (TUBING) ×1 IMPLANT
SLEEVE Z-THREAD 5X100MM (TROCAR) ×2 IMPLANT
SPECIMEN JAR SMALL (MISCELLANEOUS) ×1 IMPLANT
STOPCOCK 4 WAY LG BORE MALE ST (IV SETS) IMPLANT
SUT MNCRL AB 4-0 PS2 18 (SUTURE) ×1 IMPLANT
TOWEL GREEN STERILE (TOWEL DISPOSABLE) ×1 IMPLANT
TOWEL GREEN STERILE FF (TOWEL DISPOSABLE) ×1 IMPLANT
TRAY LAPAROSCOPIC MC (CUSTOM PROCEDURE TRAY) ×1 IMPLANT
TROCAR Z THREAD OPTICAL 12X100 (TROCAR) ×1 IMPLANT
TROCAR Z-THREAD OPTICAL 5X100M (TROCAR) ×1 IMPLANT
WARMER LAPAROSCOPE (MISCELLANEOUS) ×1 IMPLANT
WATER STERILE IRR 1000ML POUR (IV SOLUTION) ×1 IMPLANT

## 2023-09-18 NOTE — Progress Notes (Signed)
Subjective: Pain better, no nausea  ROS: See above, otherwise other systems negative  Objective: Vital signs in last 24 hours: Temp:  [97.6 F (36.4 C)-98.6 F (37 C)] 98.6 F (37 C) (10/12 0614) Pulse Rate:  [59-60] 59 (10/12 0614) Resp:  [18] 18 (10/12 0614) BP: (136-158)/(71-82) 136/73 (10/12 0614) SpO2:  [94 %-100 %] 94 % (10/12 0614) Last BM Date : 09/16/23  Intake/Output from previous day: 10/11 0701 - 10/12 0700 In: -  Out: 1800 [Urine:1800] Intake/Output this shift: No intake/output data recorded.  PE: Gen: NAd Resp: nonlabored CV: RRR Abd: soft, NT, ND  Lab Results:  Recent Labs    09/17/23 0454 09/18/23 0327  WBC 6.4 7.3  HGB 15.9 15.0  HCT 48.6 46.7  PLT 133* 149*   BMET Recent Labs    09/17/23 0454 09/18/23 0327  NA 140 139  K 4.3 4.3  CL 103 104  CO2 27 29  GLUCOSE 94 95  BUN 16 12  CREATININE 1.64* 1.74*  CALCIUM 9.3 9.1   PT/INR No results for input(s): "LABPROT", "INR" in the last 72 hours. CMP     Component Value Date/Time   NA 139 09/18/2023 0327   NA 144 09/14/2023 0000   K 4.3 09/18/2023 0327   CL 104 09/18/2023 0327   CO2 29 09/18/2023 0327   GLUCOSE 95 09/18/2023 0327   BUN 12 09/18/2023 0327   BUN 18 09/14/2023 0000   CREATININE 1.74 (H) 09/18/2023 0327   CREATININE 1.02 01/22/2017 1503   CALCIUM 9.1 09/18/2023 0327   PROT 5.9 (L) 09/18/2023 0327   PROT 6.3 11/01/2017 0939   ALBUMIN 3.3 (L) 09/18/2023 0327   ALBUMIN 4.1 11/01/2017 0939   AST 44 (H) 09/18/2023 0327   ALT 72 (H) 09/18/2023 0327   ALKPHOS 77 09/18/2023 0327   BILITOT 1.1 09/18/2023 0327   BILITOT 0.5 11/01/2017 0939   GFRNONAA 40 (L) 09/18/2023 0327   GFRAA 56 (L) 10/18/2020 1141   Lipase     Component Value Date/Time   LIPASE 110 (H) 09/18/2023 0327    Studies/Results: US Abdomen Limited RUQ (LIVER/GB)  Result Date: 09/17/2023 CLINICAL DATA:  Gallstone pancreatitis.  History of epigastric pain. EXAM: ULTRASOUND ABDOMEN LIMITED  RIGHT UPPER QUADRANT COMPARISON:  CT abdomen 09/16/2023 FINDINGS: Gallbladder: Multiple layering echogenic stones in the gallbladder. Posterior acoustic shadowing associated with the gallstones. Gallbladder wall is borderline measuring approximately 3 mm. No sonographic Murphy sign. No pericholecystic fluid. Common bile duct: Diameter: 7 mm and similar to recent CT. Liver: Slightly increased echogenicity. Liver is difficult to visualize and appears to be mildly heterogeneous. Liver is better characterized on the recent CT. No discrete lesion identified. Portal vein is patent on color Doppler imaging with normal direction of blood flow towards the liver. No intrahepatic biliary dilatation. Other: None. IMPRESSION: 1. Cholelithiasis without evidence for acute cholecystitis. 2. Common bile duct measures 7 mm and similar to recent CT. Electronically Signed   By: Richarda Overlie M.D.   On: 09/17/2023 10:03   CT ABDOMEN PELVIS W CONTRAST  Result Date: 09/16/2023 CLINICAL DATA:  Epigastric pain nausea vomiting EXAM: CT ABDOMEN AND PELVIS WITH CONTRAST TECHNIQUE: Multidetector CT imaging of the abdomen and pelvis was performed using the standard protocol following bolus administration of intravenous contrast. RADIATION DOSE REDUCTION: This exam was performed according to the departmental dose-optimization program which includes automated exposure control, adjustment of the mA and/or kV according to patient size and/or use of iterative reconstruction  technique. CONTRAST:  OMNIPAQUE IOHEXOL 300 MG/ML  SOLN COMPARISON:  02/26/2022 FINDINGS: Lower chest: Lung bases are clear. Partially visualized cardiac pacing leads. Hepatobiliary: Subcentimeter hypodensities too small to further characterize. Gallstones. No biliary dilatation Pancreas: Unremarkable. No pancreatic ductal dilatation or surrounding inflammatory changes. Spleen: Normal in size without focal abnormality. Adrenals/Urinary Tract: Adrenal glands are  unremarkable. Kidneys are normal, without renal calculi, focal lesion, or hydronephrosis. Bladder is unremarkable. Stomach/Bowel: Stomach is within normal limits. Appendix appears normal. No evidence of bowel wall thickening, distention, or inflammatory changes. Mild diverticular disease of the colon without acute inflammation Vascular/Lymphatic: Moderate aortic atherosclerosis. Aneurysmal dilatation of the distal infrarenal abdominal aorta measuring up to 3.1 cm, coronal series 601 image 91. Aneurysmal dilatation of left common iliac artery measuring 2.5 cm. Mild aneurysmal dilatation of the right proximal external iliac artery measuring up to 1.5 cm. No suspicious lymph nodes Reproductive: Enlarged prostate Other: Negative for pelvic effusion or free air. Small fat containing inguinal hernias Musculoskeletal: No acute or suspicious osseous abnormality. IMPRESSION: 1. No CT evidence for acute intra-abdominal or pelvic abnormality. 2. Gallstones. 3. 3.1 cm infrarenal abdominal aortic aneurysm. Recommend follow-up ultrasound every 3 years. (Ref.: J Vasc Surg. 2018; 67:2-77 and J Am Coll Radiol 2013;10(10):789-794.) 4. Enlarged prostate. 5. Mild diverticular disease of the colon without acute inflammation. 6. Aortic atherosclerosis. Aortic Atherosclerosis (ICD10-I70.0). Electronically Signed   By: Jasmine Pang M.D.   On: 09/16/2023 21:19    Anti-infectives: Anti-infectives (From admission, onward)    None       Assessment/Plan  77 yo male with gallstone pancreatitis. Labs normalizing, pain resolved   FEN - NPO VTE - xarelto last given 10/9 ID - [email or web address] preop Dispo - OR today We discussed the etiology of her pain, we discussed treatment options and recommended surgery. We discussed details of surgery including general anesthesia, laparoscopic approach, identification of cystic duct and common bile duct. Ligation of cystic duct and cystic artery. Possible need for intraoperative cholangiogram or open  procedure. Possible risks of common bile duct injury, liver injury, cystic duct leak, bleeding, infection, post-cholecystectomy syndrome. The patient showed good understanding and all questions were answered   I reviewed last 24 h vitals and pain scores, last 48 h intake and output, last 24 h labs and trends, and last 24 h imaging results.  This care required high  level of medical decision making.    LOS: 1 day   [facility] 09/18/2023, 8:03 AM Please see Amion for pager number during day hours 7:00am-4:30pm or 7:00am -11:30am on weekends

## 2023-09-18 NOTE — Plan of Care (Signed)
Problem: Education: Goal: Understanding of disease, treatment, and recovery process will improve Outcome: Progressing   Problem: Activity: Goal: Ability to return to baseline activity level will improve Outcome: Progressing   Problem: Cardiac: Goal: Ability to maintain adequate cardiovascular perfusion will improve Outcome: Progressing Goal: Vascular access site(s) Level 0-1 will be maintained Outcome: Progressing   Problem: Health Behavior/ Discharge Planning: Goal: Ability to safely manage health related needs after discharge Outcome: Progressing   Problem: Education: Goal: Knowledge of General Education information will improve Description: Including pain rating scale, medication(s)/side effects and non-pharmacologic comfort measures Outcome: Progressing   Problem: Health Behavior/Discharge Planning: Goal: Ability to manage health-related needs will improve Outcome: Progressing   Problem: Clinical Measurements: Goal: Ability to maintain clinical measurements within normal limits will improve Outcome: Progressing Goal: Will remain free from infection Outcome: Progressing Goal: Diagnostic test results will improve Outcome: Progressing Goal: Respiratory complications will improve Outcome: Progressing Goal: Cardiovascular complication will be avoided Outcome: Progressing   Problem: Activity: Goal: Risk for activity intolerance will decrease Outcome: Progressing   Problem: Nutrition: Goal: Adequate nutrition will be maintained Outcome: Progressing   Problem: Coping: Goal: Level of anxiety will decrease Outcome: Progressing   Problem: Elimination: Goal: Will not experience complications related to bowel motility Outcome: Progressing Goal: Will not experience complications related to urinary retention Outcome: Progressing   Problem: Pain Managment: Goal: General experience of comfort will improve Outcome: Progressing   Problem: Safety: Goal: Ability to  remain free from injury will improve Outcome: Progressing   Problem: Skin Integrity: Goal: Risk for impaired skin integrity will decrease Outcome: Progressing

## 2023-09-18 NOTE — Anesthesia Preprocedure Evaluation (Addendum)
Anesthesia Evaluation  Patient identified by MRN, date of birth, ID band Patient awake    Reviewed: Allergy & Precautions, NPO status , Patient's Chart, lab work & pertinent test results  Airway Mallampati: III  TM Distance: >3 FB Neck ROM: Full    Dental  (+) Teeth Intact, Dental Advisory Given   Pulmonary asthma , sleep apnea , former smoker   breath sounds clear to auscultation       Cardiovascular hypertension, + dysrhythmias Atrial Fibrillation + pacemaker  Rhythm:Regular Rate:Normal     Neuro/Psych  Neuromuscular disease  negative psych ROS   GI/Hepatic Neg liver ROS,GERD  ,,  Endo/Other  Hypothyroidism    Renal/GU Renal disease     Musculoskeletal  (+) Arthritis , Rheumatoid disorders,    Abdominal   Peds  Hematology negative hematology ROS (+)   Anesthesia Other Findings - HLD  Reproductive/Obstetrics                             Anesthesia Physical Anesthesia Plan  ASA: 3  Anesthesia Plan: General   Post-op Pain Management: Tylenol PO (pre-op)*   Induction: Intravenous  PONV Risk Score and Plan: 3 and Ondansetron and Treatment may vary due to age or medical condition  Airway Management Planned: Oral ETT  Additional Equipment: None  Intra-op Plan:   Post-operative Plan: Extubation in OR  Informed Consent: I have reviewed the patients History and Physical, chart, labs and discussed the procedure including the risks, benefits and alternatives for the proposed anesthesia with the patient or authorized representative who has indicated his/her understanding and acceptance.     Dental advisory given  Plan Discussed with: CRNA  Anesthesia Plan Comments: (Magnet in room)       Anesthesia Quick Evaluation

## 2023-09-18 NOTE — Plan of Care (Signed)
  Problem: Education: Goal: Understanding of disease, treatment, and recovery process will improve Outcome: Progressing   Problem: Activity: Goal: Ability to return to baseline activity level will improve Outcome: Progressing   Problem: Cardiac: Goal: Ability to maintain adequate cardiovascular perfusion will improve Outcome: Progressing Goal: Vascular access site(s) Level 0-1 will be maintained Outcome: Progressing   Problem: Health Behavior/ Discharge Planning: Goal: Ability to safely manage health related needs after discharge Outcome: Progressing   Problem: Education: Goal: Knowledge of General Education information will improve Description: Including pain rating scale, medication(s)/side effects and non-pharmacologic comfort measures Outcome: Progressing   Problem: Health Behavior/Discharge Planning: Goal: Ability to manage health-related needs will improve Outcome: Progressing   Problem: Clinical Measurements: Goal: Ability to maintain clinical measurements within normal limits will improve Outcome: Progressing Goal: Will remain free from infection Outcome: Progressing Goal: Diagnostic test results will improve Outcome: Progressing Goal: Respiratory complications will improve Outcome: Progressing Goal: Cardiovascular complication will be avoided Outcome: Progressing   Problem: Activity: Goal: Risk for activity intolerance will decrease Outcome: Progressing   Problem: Nutrition: Goal: Adequate nutrition will be maintained Outcome: Progressing   Problem: Coping: Goal: Level of anxiety will decrease Outcome: Progressing   Problem: Elimination: Goal: Will not experience complications related to bowel motility Outcome: Progressing Goal: Will not experience complications related to urinary retention Outcome: Progressing   Problem: Pain Managment: Goal: General experience of comfort will improve Outcome: Progressing   Problem: Safety: Goal: Ability to  remain free from injury will improve Outcome: Progressing   Problem: Skin Integrity: Goal: Risk for impaired skin integrity will decrease Outcome: Progressing

## 2023-09-18 NOTE — Progress Notes (Signed)
PROGRESS NOTE    Martin Rubio  KYH:[id number] DOB: 1946/03/16 DOA: 09/16/2023 PCP: Wanda Plump, MD   Brief Narrative:  HPI:  Martin Rubio is a 77 y.o. male with hx of complete heart block s/p Medtronic dual-chamber pacemaker placement in 2021, atrial fibrillation s/p ablation 12/24/2022, PUD, chronic bronchitis, BPH, Graves' disease status post ablation, hypertension, hyperlipidemia, obesity, psoriatic arthritis, rheumatoid arthritis, sleep apnea on CPAP, was transferred from [facility] ED for acute pancreatitis.  Although he focuses on worsening symptoms in the past day-  Reveals 1 year history of colicky epigastric pain with fatty food, this has been especially worse in the past few weeks.  Then 1 day prior to admission was woke from sleep at 2 AM with severe epigastric pain.  Later that same day he ate lunch with a fatty potato soup and had severe exacerbation of his pain.  Overall has had constant pain since last night at 2 AM.  Denies any fevers, chills.  He has nausea, spitting of saliva.  Did have an episode of vomiting yesterday.  Did not have any known history of gallstones in the past.   Assessment & Plan:   Principal Problem:   Acute gallstone pancreatitis Active Problems:   Essential hypertension   Hyperlipidemia LDL goal <100   Esophageal reflux   Hypothyroidism   Obstructive sleep apnea   Rheumatoid arthritis (HCC)   Benign prostatic hyperplasia   Paroxysmal atrial fibrillation (HCC)   Sick sinus syndrome (HCC)   Hypercoagulable state due to paroxysmal atrial fibrillation (HCC)   Chronic kidney disease, stage 3b (HCC)   Obesity (BMI 30-39.9)  Acute gallstone pancreatitis: No evidence of cholecystitis on CT or ultrasound.  LFTs elevated initially which improved.  Pain improved.  Lipase improving.  Seen by general surgery.  Underwent laparoscopic cholecystectomy with IOC today with no evidence of choledocholithiasis.  Improving, advance diet, per general  surgery, will observe overnight and potentially discharge tomorrow if asymptomatic.    Essential hypertension: Blood pressure very well-controlled, continue amlodipine and resume losartan.   GERD: Continue PPI.  Hypothyroidism/Graves' disease: Continue Synthroid.  Paroxysmal atrial fibrillation: Continue rate control medications and antiarrhythmics but hold anticoagulation, will resume tomorrow if cleared by general surgery.  BPH: Flomax.  CKD stage IIIb: At baseline.  Obesity: Weight loss and dietary modification counseled.  Chronic intermittent thrombocytopenia: No evidence of bleeding.  Monitor.  DVT prophylaxis: Place and maintain sequential compression device Start: 09/17/23 1120SCD   Code Status: Full Code  Family Communication: Wife and 2 other family members present at bedside.  Plan of care discussed with patient in length and he/she verbalized understanding and agreed with it.  Status is: Inpatient Remains inpatient appropriate because: Slowly advancing diet, potential discharge tomorrow  Estimated body mass index is 30.43 kg/m as calculated from the following:   Height as of this encounter: 5\' 10"  (1.778 m).   Weight as of this encounter: 96.2 kg.    Nutritional Assessment: Body mass index is 30.43 kg/m.Marland Kitchen Seen by dietician.  I agree with the assessment and plan as outlined below: Nutrition Status:        . Skin Assessment: I have examined the patient's skin and I agree with the wound assessment as performed by the wound care RN as outlined below:    Consultants:  General surgery  Procedures:  As above  Antimicrobials:  Anti-infectives (From admission, onward)    Start     Dose/Rate Route Frequency Ordered Stop   09/18/23  3235  ceFAZolin (ANCEF) 2-4 GM/100ML-% IVPB       Note to Pharmacy: Kathrene Bongo D: cabinet override      09/18/23 0956 09/18/23 1122   09/18/23 0900  ceFAZolin (ANCEF) IVPB 2g/100 mL premix        2 g 200 mL/hr over 30  Minutes Intravenous On call to O.R. 09/18/23 0804 09/18/23 1150         Subjective: Patient seen and examined postoperatively.  He has mild to moderate abdominal pain which is expected.  No other complaint.  Objective: Vitals:   09/18/23 1230 09/18/23 1245 09/18/23 1305 09/18/23 1618  BP: 136/69 127/85 128/72 138/67  Pulse: 60 63 (!) 58 68  Resp: 12 16  18   Temp:  97.9 F (36.6 C) 98.2 F (36.8 C) 98.2 F (36.8 C)  TempSrc:   Oral   SpO2: 96% 96% 94% 90%  Weight:      Height:        Intake/Output Summary (Last 24 hours) at 09/18/2023 1628 Last data filed at 09/18/2023 1300 Gross per 24 hour  Intake 520 ml  Output 575 ml  Net -55 ml   Filed Weights   09/16/23 1735 09/17/23 0305 09/18/23 1005  Weight: 97.5 kg 96.2 kg 96.2 kg    Examination:  General exam: Appears calm and comfortable  Respiratory system: Clear to auscultation. Respiratory effort normal. Cardiovascular system: S1 & S2 heard, RRR. No JVD, murmurs, rubs, gallops or clicks. No pedal edema. Gastrointestinal system: Abdomen is soft, mild to moderate distention with moderate tenderness at laparoscopic sites. No organomegaly or masses felt. Normal bowel sounds heard. Central nervous system: Alert and oriented. No focal neurological deficits. Extremities: Symmetric 5 x 5 power. Skin: No rashes, lesions or ulcers.  Psychiatry: Judgement and insight appear normal. Mood & affect appropriate.   Data Reviewed: I have personally reviewed following labs and imaging studies  CBC: Recent Labs  Lab 09/14/23 0000 09/16/23 1857 09/17/23 0454 09/18/23 0327  WBC 7.2 6.9 6.4 7.3  NEUTROABS 4.50  --   --  4.1  HGB 16.2 16.2 15.9 15.0  HCT 52 48.1 48.6 46.7  MCV  --  84.4 86.5 84.8  PLT 171 141* 133* 149*   Basic Metabolic Panel: Recent Labs  Lab 09/14/23 0000 09/16/23 1857 09/17/23 0454 09/18/23 0327  NA 144 140 140 139  K 6.0* 4.0 4.3 4.3  CL 102 105 103 104  CO2 22 24 27 29   GLUCOSE  --  103* 94 95   BUN 18 20 16 12   CREATININE 1.7* 1.57* 1.64* 1.74*  CALCIUM 9.7 9.4 9.3 9.1  MG  --   --  2.1  --   PHOS  --   --  2.9  --    GFR: Estimated Creatinine Clearance: 40.7 mL/min (A) (by C-G formula based on SCr of 1.74 mg/dL (H)). Liver Function Tests: Recent Labs  Lab 09/14/23 0000 09/16/23 1857 09/17/23 0454 09/18/23 0327  AST 27 189* 95* 44*  ALT 21 121* 105* 72*  ALKPHOS 81 92 85 77  BILITOT  --  1.2 1.4* 1.1  PROT  --  6.5 6.3* 5.9*  ALBUMIN 4.7 3.8 3.6 3.3*   Recent Labs  Lab 09/16/23 1857 09/18/23 0327  LIPASE 3,853* 110*   No results for input(s): "AMMONIA" in the last 168 hours. Coagulation Profile: No results for input(s): "INR", "PROTIME" in the last 168 hours. Cardiac Enzymes: No results for input(s): "CKTOTAL", "CKMB", "CKMBINDEX", "TROPONINI" in the last 168  hours. BNP (last 3 results) No results for input(s): "PROBNP" in the last 8760 hours. HbA1C: No results for input(s): "HGBA1C" in the last 72 hours. CBG: No results for input(s): "GLUCAP" in the last 168 hours. Lipid Profile: Recent Labs    09/17/23 0454  CHOL 122  HDL 36*  LDLCALC 61  TRIG 562  CHOLHDL 3.4   Thyroid Function Tests: No results for input(s): "TSH", "T4TOTAL", "FREET4", "T3FREE", "THYROIDAB" in the last 72 hours. Anemia Panel: No results for input(s): "VITAMINB12", "FOLATE", "FERRITIN", "TIBC", "IRON", "RETICCTPCT" in the last 72 hours. Sepsis Labs: No results for input(s): "PROCALCITON", "LATICACIDVEN" in the last 168 hours.  No results found for this or any previous visit (from the past 240 hour(s)).   Radiology Studies: DG Cholangiogram Operative  Result Date: 09/18/2023 CLINICAL DATA:  Laparoscopic cholecystectomy with intraoperative cholangiogram EXAM: INTRAOPERATIVE CHOLANGIOGRAM TECHNIQUE: Cholangiographic images from the C-arm fluoroscopic device were submitted for interpretation post-operatively. Please see the procedural report for the amount of contrast and the  fluoroscopy time utilized. FLUOROSCOPY: Radiation Exposure Index (as provided by the fluoroscopic device): 5.3788 mGy Kerma COMPARISON:  Prior abdominal ultrasound 09/17/2023 FINDINGS: Intraoperative saved cine clip an image are submitted for review. The images demonstrate cannulation of the cystic duct remanent with intraoperative cholangiogram. No evidence of biliary ductal dilatation, stenosis, stricture or choledocholithiasis. Contrast material passes freely through the ampulla and into the duodenum. IMPRESSION: Negative intraoperative cholangiogram. Electronically Signed   By: Malachy Moan M.D.   On: 09/18/2023 15:38   US Abdomen Limited RUQ (LIVER/GB)  Result Date: 09/17/2023 CLINICAL DATA:  Gallstone pancreatitis.  History of epigastric pain. EXAM: ULTRASOUND ABDOMEN LIMITED RIGHT UPPER QUADRANT COMPARISON:  CT abdomen 09/16/2023 FINDINGS: Gallbladder: Multiple layering echogenic stones in the gallbladder. Posterior acoustic shadowing associated with the gallstones. Gallbladder wall is borderline measuring approximately 3 mm. No sonographic Murphy sign. No pericholecystic fluid. Common bile duct: Diameter: 7 mm and similar to recent CT. Liver: Slightly increased echogenicity. Liver is difficult to visualize and appears to be mildly heterogeneous. Liver is better characterized on the recent CT. No discrete lesion identified. Portal vein is patent on color Doppler imaging with normal direction of blood flow towards the liver. No intrahepatic biliary dilatation. Other: None. IMPRESSION: 1. Cholelithiasis without evidence for acute cholecystitis. 2. Common bile duct measures 7 mm and similar to recent CT. Electronically Signed   By: Richarda Overlie M.D.   On: 09/17/2023 10:03   CT ABDOMEN PELVIS W CONTRAST  Result Date: 09/16/2023 CLINICAL DATA:  Epigastric pain nausea vomiting EXAM: CT ABDOMEN AND PELVIS WITH CONTRAST TECHNIQUE: Multidetector CT imaging of the abdomen and pelvis was performed using the  standard protocol following bolus administration of intravenous contrast. RADIATION DOSE REDUCTION: This exam was performed according to the departmental dose-optimization program which includes automated exposure control, adjustment of the mA and/or kV according to patient size and/or use of iterative reconstruction technique. CONTRAST:  OMNIPAQUE IOHEXOL 300 MG/ML  SOLN COMPARISON:  02/26/2022 FINDINGS: Lower chest: Lung bases are clear. Partially visualized cardiac pacing leads. Hepatobiliary: Subcentimeter hypodensities too small to further characterize. Gallstones. No biliary dilatation Pancreas: Unremarkable. No pancreatic ductal dilatation or surrounding inflammatory changes. Spleen: Normal in size without focal abnormality. Adrenals/Urinary Tract: Adrenal glands are unremarkable. Kidneys are normal, without renal calculi, focal lesion, or hydronephrosis. Bladder is unremarkable. Stomach/Bowel: Stomach is within normal limits. Appendix appears normal. No evidence of bowel wall thickening, distention, or inflammatory changes. Mild diverticular disease of the colon without acute inflammation Vascular/Lymphatic: Moderate aortic  atherosclerosis. Aneurysmal dilatation of the distal infrarenal abdominal aorta measuring up to 3.1 cm, coronal series 601 image 91. Aneurysmal dilatation of left common iliac artery measuring 2.5 cm. Mild aneurysmal dilatation of the right proximal external iliac artery measuring up to 1.5 cm. No suspicious lymph nodes Reproductive: Enlarged prostate Other: Negative for pelvic effusion or free air. Small fat containing inguinal hernias Musculoskeletal: No acute or suspicious osseous abnormality. IMPRESSION: 1. No CT evidence for acute intra-abdominal or pelvic abnormality. 2. Gallstones. 3. 3.1 cm infrarenal abdominal aortic aneurysm. Recommend follow-up ultrasound every 3 years. (Ref.: J Vasc Surg. 2018; 67:2-77 and J Am Coll Radiol 2013;10(10):789-794.) 4. Enlarged prostate. 5.  Mild diverticular disease of the colon without acute inflammation. 6. Aortic atherosclerosis. Aortic Atherosclerosis (ICD10-I70.0). Electronically Signed   By: Jasmine Pang M.D.   On: 09/16/2023 21:19    Scheduled Meds:  amLODipine  5 mg Oral Daily   dofetilide  250 mcg Oral BID   fentaNYL       levothyroxine  137 mcg Oral Q0600   losartan  50 mg Oral Daily   melatonin  5 mg Oral QHS   oxybutynin  10 mg Oral QHS   oxyCODONE       pantoprazole  40 mg Oral Daily   pregabalin  50 mg Oral QHS   sodium chloride flush  10 mL Intravenous Q12H   tamsulosin  0.4 mg Oral QHS   Continuous Infusions:  acetaminophen        LOS: 1 day   Hughie Closs, MD [facility]  09/18/2023, 4:28 PM   *Please note that this is a verbal dictation therefore any spelling or grammatical errors are due to the "Dragon Medical One" system interpretation.  Please page via Amion and do not message via secure chat for urgent patient care matters. Secure chat can be used for non urgent patient care matters.  How to contact the [facility] Attending or Consulting provider 7A - 7P or covering provider during after hours 7P -7A, for this patient?  Check the care team in [facility] and look for a) attending/consulting [facility] provider listed and b) the [facility] team listed. Page or secure chat 7A-7P. Log into [email or web address] and use 's universal password to access. If you do not have the password, please contact the hospital operator. Locate the [facility] provider you are looking for under [facility] and page to a number that you can be directly reached. If you still have difficulty reaching the provider, please page the [facility] (Director on Call) for the Hospitalists listed on amion for assistance.

## 2023-09-18 NOTE — Transfer of Care (Signed)
Immediate Anesthesia Transfer of Care Note  Patient: Martin Rubio  Procedure(s) Performed: LAPAROSCOPIC CHOLECYSTECTOMY WITH INTRAOPERATIVE CHOLANGIOGRAM  Patient Location: PACU  Anesthesia Type:General  Level of Consciousness: awake, drowsy, and patient cooperative  Airway & Oxygen Therapy: Patient Spontanous Breathing and Patient connected to face mask oxygen  Post-op Assessment: Report given to RN and Post -op Vital signs reviewed and stable  Post vital signs: Reviewed and stable  Last Vitals:  Vitals Value Taken Time  BP 133/75 09/18/23 1211  Temp    Pulse 61 09/18/23 1214  Resp 18 09/18/23 1214  SpO2 96 % 09/18/23 1214  Vitals shown include unfiled device data.  Last Pain:  Vitals:   09/18/23 1000  TempSrc: Oral  PainSc:          Complications: No notable events documented.

## 2023-09-18 NOTE — Anesthesia Procedure Notes (Signed)
Procedure Name: Intubation Date/Time: 09/18/2023 11:13 AM  Performed by: Yolonda Kida, CRNAPre-anesthesia Checklist: Patient identified, Emergency Drugs available, Suction available and Patient being monitored Patient Re-evaluated:Patient Re-evaluated prior to induction Oxygen Delivery Method: Circle System Utilized Preoxygenation: Pre-oxygenation with 100% oxygen Induction Type: IV induction Ventilation: Mask ventilation without difficulty Laryngoscope Size: Mac and 4 Grade View: Grade II Tube type: Oral Tube size: 7.5 mm Number of attempts: 1 Airway Equipment and Method: Stylet Placement Confirmation: ETT inserted through vocal cords under direct vision, positive ETCO2 and breath sounds checked- equal and bilateral Secured at: 21 cm Tube secured with: Tape Dental Injury: Teeth and Oropharynx as per pre-operative assessment

## 2023-09-18 NOTE — Anesthesia Postprocedure Evaluation (Signed)
Anesthesia Post Note  Patient: Martin Rubio  Procedure(s) Performed: LAPAROSCOPIC CHOLECYSTECTOMY WITH INTRAOPERATIVE CHOLANGIOGRAM     Patient location during evaluation: PACU Anesthesia Type: General Level of consciousness: awake and alert Pain management: pain level controlled Vital Signs Assessment: post-procedure vital signs reviewed and stable Respiratory status: spontaneous breathing, nonlabored ventilation, respiratory function stable and patient connected to nasal cannula oxygen Cardiovascular status: blood pressure returned to baseline and stable Postop Assessment: no apparent nausea or vomiting Anesthetic complications: no  No notable events documented.  Last Vitals:  Vitals:   09/18/23 1245 09/18/23 1305  BP: 127/85 128/72  Pulse: 63 (!) 58  Resp: 16   Temp: 36.6 C 36.8 C  SpO2: 96% 94%    Last Pain:  Vitals:   09/18/23 1305  TempSrc: Oral  PainSc:                  [name]

## 2023-09-18 NOTE — Op Note (Signed)
PATIENT:  Martin Rubio  76 y.o. male  PRE-OPERATIVE DIAGNOSIS:  ACUTE CHOLECYSTITIS  POST-OPERATIVE DIAGNOSIS:  ACUTE CHOLECYSTITIS  PROCEDURE:  Procedure(s): LAPAROSCOPIC CHOLECYSTECTOMY WITH INTRAOPERATIVE CHOLANGIOGRAM   SURGEON:  Dottie Vaquerano, De Blanch, MD   ASSISTANT: n/a  ANESTHESIA:   local and general  Indications for procedure: [name] CHECK is a 77 y.o. male with symptoms of Abdominal pain and Nausea and vomiting consistent with gallbladder disease, Confirmed by ultrasound.  Description of procedure: The patient was brought into the operative suite, placed supine. Anesthesia was administered with endotracheal tube. Patient was strapped in place and foot board was secured. All pressure points were offloaded by foam padding. The patient was prepped and draped in the usual sterile fashion.  A periumbilical incision was made and optical entry was used to enter the abdomen. 2 5 mm trocars were placed on in the right lateral space on in the right subcostal space. A 12mm trocar was placed in the subxiphoid space. Marcaine was infused to the subxiphoid space and lateral upper right abdomen in the transversus abdominis plane. Next the patient was placed in reverse trendelenberg. The gallbladder appearedchronically inflamed.   The gallbladder was retracted cephalad and lateral. The peritoneum was reflected off the infundibulum working lateral to medial. The cystic duct and cystic artery were identified and further dissection revealed a critical view, due to concern for choledocholithiasis a cholangiogram was performed with ductotomy and cook catheter passed through a separate subcostal stab incision. The cystic duct and cystic artery were doubly clipped and ligated.   The gallbladder was removed off the liver bed with cautery. The Gallbladder was placed in a specimen bag. The gallbladder fossa was irrigated and hemostasis was applied with cautery. The gallbladder was removed via the 12mm  trocar. The fascial defect was closed with interrupted 0 vicryl suture via laparoscopic trans-fascial suture passer and loose stones were removed. Pneumoperitoneum was removed, all trocar were removed. All incisions were closed with 4-0 monocryl subcuticular stitch. The patient woke from anesthesia and was brought to PACU in stable condition. All counts were correct  Findings: normal ductal anatomy  Specimen: gallbladder  Blood loss: 20 ml  Local anesthesia: 30 ml Marcaine  Complications: none  PLAN OF CARE: Admit to inpatient   PATIENT DISPOSITION:  PACU - hemodynamically stable.  De Blanch [facility], [place]

## 2023-09-19 ENCOUNTER — Encounter (HOSPITAL_COMMUNITY): Payer: Self-pay | Admitting: General Surgery

## 2023-09-19 DIAGNOSIS — K851 Biliary acute pancreatitis without necrosis or infection: Secondary | ICD-10-CM | POA: Diagnosis not present

## 2023-09-19 MED ORDER — OXYCODONE HCL 5 MG PO TABS: 5.0000 mg | ORAL_TABLET | ORAL | 0 refills | Status: DC | PRN

## 2023-09-19 NOTE — Discharge Summary (Signed)
Physician Discharge Summary  Martin Rubio WJX:[id number] DOB: 26-Oct-1946 DOA: 09/16/2023  PCP: Wanda Plump, MD  Admit date: 09/16/2023 Discharge date: 09/19/2023 30 Day Unplanned Readmission Risk Score    Flowsheet Row ED to Hosp-Admission (Discharged) from 09/16/2023 in [facility]M KIDNEY UNIT  30 Day Unplanned Readmission Risk Score (%) 10.55 Filed at 09/19/2023 0801       This score is the patient's risk of an unplanned readmission within 30 days of being discharged (0 -100%). The score is based on dignosis, age, lab data, medications, orders, and past utilization.   Low:  0-14.9   Medium: 15-21.9   High: 22-29.9   Extreme: 30 and above          Admitted From: Home Disposition: Home  Recommendations for Outpatient Follow-up:  Follow up with PCP in 1-2 weeks Please obtain BMP/CBC in one week Follow-up with general surgery in 2 weeks. Please follow up with your PCP on the following pending results: Unresulted Labs (From admission, onward)    None         Home Health: None Equipment/Devices: None  Discharge Condition: Stable CODE STATUS: Full code Diet recommendation: Cardiac  Subjective: Seen and examined.  Wife at the bedside.  He is feeling well, tolerated diet without any nausea vomiting or abdominal pain.  Has been cleared by general surgery and he is in agreement with the discharge plan today.  Brief/Interim Summary: Martin Rubio is a 77 y.o. male with hx of complete heart block s/p Medtronic dual-chamber pacemaker placement in 2021, atrial fibrillation s/p ablation 12/24/2022, PUD, chronic bronchitis, BPH, Graves' disease status post ablation, hypertension, hyperlipidemia, obesity, psoriatic arthritis, rheumatoid arthritis, sleep apnea on CPAP, presented to med [facility] ED with abdominal pain and was diagnosed with acute pancreatitis and eventually transferred to [facility].  Further details below.    Acute gallstone  pancreatitis: No evidence of cholecystitis on CT or ultrasound.  LFTs elevated initially which improved.  Seen by general surgery.  Underwent laparoscopic cholecystectomy with IOC today with no evidence of choledocholithiasis.  Doing well postoperatively with no symptoms at all and has tolerated diet, has been cleared by general surgery for discharge, no antibiotics recommended at discharge.  Some pain medications/oxycodone prescribed to patient.  Follow-up with general surgery in 2 weeks.   Essential hypertension: Resume PTA medications.   GERD: Continue PPI.   Hypothyroidism/Graves' disease: Continue Synthroid.   Paroxysmal atrial fibrillation: Resume antiarrhythmics and anticoagulant at discharge.   BPH: Flomax.   CKD stage IIIb: At baseline.   Obesity: Weight loss and dietary modification counseled.   Chronic intermittent thrombocytopenia: No evidence of bleeding.  Monitor.  Discharge plan was discussed with patient and/or family member and they verbalized understanding and agreed with it.  Discharge Diagnoses:  Principal Problem:   Acute gallstone pancreatitis Active Problems:   Essential hypertension   Hyperlipidemia LDL goal <100   Esophageal reflux   Hypothyroidism   Obstructive sleep apnea   Rheumatoid arthritis (HCC)   Benign prostatic hyperplasia   Paroxysmal atrial fibrillation (HCC)   Sick sinus syndrome (HCC)   Hypercoagulable state due to paroxysmal atrial fibrillation (HCC)   Chronic kidney disease, stage 3b (HCC)   Obesity (BMI 30-39.9)    Discharge Instructions   Allergies as of 09/19/2023       Reactions   Codeine    Tension, nasty feeling   Nsaids Other (See Comments)   Renal insufficiency   Prednisone Other (See Comments)  Unknown/ sometimes takes with lower dose   Statins Other (See Comments)   Joint pain   Sulfa Antibiotics Other (See Comments)   Joint pain        Medication List     TAKE these medications    amLODipine 5 MG  tablet Commonly known as: NORVASC Take 1 tablet (5 mg total) by mouth daily.   cholecalciferol 25 MCG (1000 UNIT) tablet Commonly known as: VITAMIN D3 Take 2,000 Units by mouth daily in the afternoon.   diclofenac Sodium 1 % Gel Commonly known as: Voltaren Apply 4 g topically in the morning and at bedtime. What changed:  when to take this reasons to take this   dofetilide 250 MCG capsule Commonly known as: TIKOSYN TAKE ONE CAPSULE BY MOUTH TWICE A DAY   Ferretts 325 (106 Fe) MG Tabs tablet Generic drug: ferrous fumarate Take 1 tablet (106 mg of iron total) by mouth 2 (two) times daily. What changed:  how much to take when to take this   fluticasone 50 MCG/ACT nasal spray Commonly known as: FLONASE Place 1 spray into both nostrils as needed for allergies.   hydrOXYzine 25 MG capsule Commonly known as: VISTARIL Take 1 capsule (25 mg total) by mouth every 8 (eight) hours as needed.   levothyroxine 137 MCG tablet Commonly known as: SYNTHROID Take 1 tablet (137 mcg total) by mouth daily before breakfast.   losartan 50 MG tablet Commonly known as: COZAAR TAKE ONE TABLET BY MOUTH EVERY MORNING AND TAKE ONE TABLET BY MOUTH AT BEDTIME   meclizine 25 MG tablet Commonly known as: ANTIVERT Take 25 mg by mouth 3 (three) times daily as needed for dizziness.   melatonin 5 MG Tabs Take 5 mg by mouth at bedtime.   methocarbamol 500 MG tablet Commonly known as: ROBAXIN Take 1 tablet (500 mg total) by mouth every 8 (eight) hours as needed for muscle spasms.   OVER THE COUNTER MEDICATION Take 2 capsules by mouth daily in the afternoon. Nervprin - Healthy Nerve Support - Peripheral Neuropathy Relief   oxybutynin 10 MG 24 hr tablet Commonly known as: DITROPAN-XL Take 10 mg by mouth at bedtime.   oxyCODONE 5 MG immediate release tablet Commonly known as: Oxy IR/ROXICODONE Take 1 tablet (5 mg total) by mouth every 4 (four) hours as needed for severe pain.   pantoprazole 40 MG  tablet Commonly known as: PROTONIX Take 1 tablet (40 mg total) by mouth daily.   pregabalin 50 MG capsule Commonly known as: LYRICA Take 50 mg by mouth at bedtime as needed.   Repatha SureClick 140 MG/ML Soaj Generic drug: Evolocumab Inject 140 mg into the skin every 14 (fourteen) days.   rivaroxaban 20 MG Tabs tablet Commonly known as: XARELTO Take 1 tablet (20 mg total) by mouth daily with supper. Dose change, please call [phone]   tadalafil 20 MG tablet Commonly known as: CIALIS Take 20 mg by mouth daily as needed for erectile dysfunction.   tamsulosin 0.4 MG Caps capsule Commonly known as: FLOMAX Take 0.4 mg by mouth at bedtime.   triamcinolone cream 0.1 % Commonly known as: KENALOG Apply 1 application topically daily as needed for itching.   Vitamin B 12 500 MCG Tabs Take 1,000 mcg by mouth daily in the afternoon.        Follow-up Information     Kinsinger, De Blanch, MD. Schedule an appointment as soon as possible for a visit in 3 week(s).   Specialty: General Surgery Contact information: [phone]  [address] [place] [address] [phone]         Wanda Plump, MD Follow up in 1 week(s).   Specialty: Internal Medicine Contact information: [address] [place] [address] [phone]                Allergies  Allergen Reactions   Codeine     Tension, nasty feeling    Nsaids Other (See Comments)    Renal insufficiency   Prednisone Other (See Comments)    Unknown/ sometimes takes with lower dose   Statins Other (See Comments)    Joint pain    Sulfa Antibiotics Other (See Comments)    Joint pain    Consultations: General surgery   Procedures/Studies: DG Cholangiogram Operative  Result Date: 09/18/2023 CLINICAL DATA:  Laparoscopic cholecystectomy with intraoperative cholangiogram EXAM: INTRAOPERATIVE CHOLANGIOGRAM TECHNIQUE: Cholangiographic images from the C-arm fluoroscopic device were  submitted for interpretation post-operatively. Please see the procedural report for the amount of contrast and the fluoroscopy time utilized. FLUOROSCOPY: Radiation Exposure Index (as provided by the fluoroscopic device): 5.3788 mGy Kerma COMPARISON:  Prior abdominal ultrasound 09/17/2023 FINDINGS: Intraoperative saved cine clip an image are submitted for review. The images demonstrate cannulation of the cystic duct remanent with intraoperative cholangiogram. No evidence of biliary ductal dilatation, stenosis, stricture or choledocholithiasis. Contrast material passes freely through the ampulla and into the duodenum. IMPRESSION: Negative intraoperative cholangiogram. Electronically Signed   By: Malachy Moan M.D.   On: 09/18/2023 15:38   US Abdomen Limited RUQ (LIVER/GB)  Result Date: 09/17/2023 CLINICAL DATA:  Gallstone pancreatitis.  History of epigastric pain. EXAM: ULTRASOUND ABDOMEN LIMITED RIGHT UPPER QUADRANT COMPARISON:  CT abdomen 09/16/2023 FINDINGS: Gallbladder: Multiple layering echogenic stones in the gallbladder. Posterior acoustic shadowing associated with the gallstones. Gallbladder wall is borderline measuring approximately 3 mm. No sonographic Murphy sign. No pericholecystic fluid. Common bile duct: Diameter: 7 mm and similar to recent CT. Liver: Slightly increased echogenicity. Liver is difficult to visualize and appears to be mildly heterogeneous. Liver is better characterized on the recent CT. No discrete lesion identified. Portal vein is patent on color Doppler imaging with normal direction of blood flow towards the liver. No intrahepatic biliary dilatation. Other: None. IMPRESSION: 1. Cholelithiasis without evidence for acute cholecystitis. 2. Common bile duct measures 7 mm and similar to recent CT. Electronically Signed   By: Richarda Overlie M.D.   On: 09/17/2023 10:03   CT ABDOMEN PELVIS W CONTRAST  Result Date: 09/16/2023 CLINICAL DATA:  Epigastric pain nausea vomiting EXAM: CT  ABDOMEN AND PELVIS WITH CONTRAST TECHNIQUE: Multidetector CT imaging of the abdomen and pelvis was performed using the standard protocol following bolus administration of intravenous contrast. RADIATION DOSE REDUCTION: This exam was performed according to the departmental dose-optimization program which includes automated exposure control, adjustment of the mA and/or kV according to patient size and/or use of iterative reconstruction technique. CONTRAST:  OMNIPAQUE IOHEXOL 300 MG/ML  SOLN COMPARISON:  02/26/2022 FINDINGS: Lower chest: Lung bases are clear. Partially visualized cardiac pacing leads. Hepatobiliary: Subcentimeter hypodensities too small to further characterize. Gallstones. No biliary dilatation Pancreas: Unremarkable. No pancreatic ductal dilatation or surrounding inflammatory changes. Spleen: Normal in size without focal abnormality. Adrenals/Urinary Tract: Adrenal glands are unremarkable. Kidneys are normal, without renal calculi, focal lesion, or hydronephrosis. Bladder is unremarkable. Stomach/Bowel: Stomach is within normal limits. Appendix appears normal. No evidence of bowel wall thickening, distention, or inflammatory changes. Mild diverticular disease of the colon without acute  inflammation Vascular/Lymphatic: Moderate aortic atherosclerosis. Aneurysmal dilatation of the distal infrarenal abdominal aorta measuring up to 3.1 cm, coronal series 601 image 91. Aneurysmal dilatation of left common iliac artery measuring 2.5 cm. Mild aneurysmal dilatation of the right proximal external iliac artery measuring up to 1.5 cm. No suspicious lymph nodes Reproductive: Enlarged prostate Other: Negative for pelvic effusion or free air. Small fat containing inguinal hernias Musculoskeletal: No acute or suspicious osseous abnormality. IMPRESSION: 1. No CT evidence for acute intra-abdominal or pelvic abnormality. 2. Gallstones. 3. 3.1 cm infrarenal abdominal aortic aneurysm. Recommend follow-up ultrasound  every 3 years. (Ref.: J Vasc Surg. 2018; 67:2-77 and J Am Coll Radiol 2013;10(10):789-794.) 4. Enlarged prostate. 5. Mild diverticular disease of the colon without acute inflammation. 6. Aortic atherosclerosis. Aortic Atherosclerosis (ICD10-I70.0). Electronically Signed   By: Jasmine Pang M.D.   On: 09/16/2023 21:19   CUP PACEART REMOTE DEVICE CHECK  Result Date: 09/09/2023 Scheduled remote reviewed. Normal device function.  Some reports missing - EGM snipits, histograms Last remote 03/23/23, there are 15 NSVT, 8-17 beats in duration 3 SVT, 4-14sec in duration, HR's 158-188 Next remote 91 days. LA, CVRS    Discharge Exam: Vitals:   09/19/23 0331 09/19/23 0800  BP: 121/70 128/63  Pulse: (!) 58 (!) 57  Resp: 17 16  Temp: 98.1 F (36.7 C) 98.2 F (36.8 C)  SpO2: 93% 92%   Vitals:   09/18/23 1618 09/18/23 1950 09/19/23 0331 09/19/23 0800  BP: 138/67 130/61 121/70 128/63  Pulse: 68 61 (!) 58 (!) 57  Resp: 18 16 17 16   Temp: 98.2 F (36.8 C) 98.6 F (37 C) 98.1 F (36.7 C) 98.2 F (36.8 C)  TempSrc:  Oral Oral Oral  SpO2: 90% 93% 93% 92%  Weight:      Height:        General: Pt is alert, awake, not in acute distress Cardiovascular: RRR, S1/S2 +, no rubs, no gallops Respiratory: CTA bilaterally, no wheezing, no rhonchi Abdominal: Soft, NT, ND, bowel sounds + Extremities: no edema, no cyanosis    The results of significant diagnostics from this hospitalization (including imaging, microbiology, ancillary and laboratory) are listed below for reference.     Microbiology: No results found for this or any previous visit (from the past 240 hour(s)).   Labs: BNP (last 3 results) No results for input(s): "BNP" in the last 8760 hours. Basic Metabolic Panel: Recent Labs  Lab 09/14/23 0000 09/16/23 1857 09/17/23 0454 09/18/23 0327  NA 144 140 140 139  K 6.0* 4.0 4.3 4.3  CL 102 105 103 104  CO2 22 24 27 29   GLUCOSE  --  103* 94 95  BUN 18 20 16 12   CREATININE 1.7* 1.57*  1.64* 1.74*  CALCIUM 9.7 9.4 9.3 9.1  MG  --   --  2.1  --   PHOS  --   --  2.9  --    Liver Function Tests: Recent Labs  Lab 09/14/23 0000 09/16/23 1857 09/17/23 0454 09/18/23 0327  AST 27 189* 95* 44*  ALT 21 121* 105* 72*  ALKPHOS 81 92 85 77  BILITOT  --  1.2 1.4* 1.1  PROT  --  6.5 6.3* 5.9*  ALBUMIN 4.7 3.8 3.6 3.3*   Recent Labs  Lab 09/16/23 1857 09/18/23 0327  LIPASE 3,853* 110*   No results for input(s): "AMMONIA" in the last 168 hours. CBC: Recent Labs  Lab 09/14/23 0000 09/16/23 1857 09/17/23 0454 09/18/23 0327  WBC 7.2 6.9 6.4 7.3  NEUTROABS 4.50  --   --  4.1  HGB 16.2 16.2 15.9 15.0  HCT 52 48.1 48.6 46.7  MCV  --  84.4 86.5 84.8  PLT 171 141* 133* 149*   Cardiac Enzymes: No results for input(s): "CKTOTAL", "CKMB", "CKMBINDEX", "TROPONINI" in the last 168 hours. BNP: Invalid input(s): "POCBNP" CBG: No results for input(s): "GLUCAP" in the last 168 hours. D-Dimer No results for input(s): "DDIMER" in the last 72 hours. Hgb A1c No results for input(s): "HGBA1C" in the last 72 hours. Lipid Profile Recent Labs    09/17/23 0454  CHOL 122  HDL 36*  LDLCALC 61  TRIG 540  CHOLHDL 3.4   Thyroid function studies No results for input(s): "TSH", "T4TOTAL", "T3FREE", "THYROIDAB" in the last 72 hours.  Invalid input(s): "FREET3" Anemia work up No results for input(s): "VITAMINB12", "FOLATE", "FERRITIN", "TIBC", "IRON", "RETICCTPCT" in the last 72 hours. Urinalysis    Component Value Date/Time   COLORURINE YELLOW 04/14/2022 1635   APPEARANCEUR CLEAR 04/14/2022 1635   LABSPEC 1.015 04/14/2022 1635   PHURINE 5.5 04/14/2022 1635   GLUCOSEU NEGATIVE 04/14/2022 1635   HGBUR TRACE-INTACT (A) 04/14/2022 1635   BILIRUBINUR NEGATIVE 04/14/2022 1635   BILIRUBINUR neg 01/31/2021 1626   KETONESUR NEGATIVE 04/14/2022 1635   PROTEINUR Positive (A) 01/31/2021 1626   UROBILINOGEN 0.2 04/14/2022 1635   NITRITE NEGATIVE 04/14/2022 1635   LEUKOCYTESUR  NEGATIVE 04/14/2022 1635   Sepsis Labs Recent Labs  Lab 09/14/23 0000 09/16/23 1857 09/17/23 0454 09/18/23 0327  WBC 7.2 6.9 6.4 7.3   Microbiology No results found for this or any previous visit (from the past 240 hour(s)).  FURTHER DISCHARGE INSTRUCTIONS:   Get Medicines reviewed and adjusted: Please take all your medications with you for your next visit with your Primary MD   Laboratory/radiological data: Please request your Primary MD to go over all hospital tests and procedure/radiological results at the follow up, please ask your Primary MD to get all Hospital records sent to his/her office.   In some cases, they will be blood work, cultures and biopsy results pending at the time of your discharge. Please request that your primary care M.D. goes through all the records of your hospital data and follows up on these results.   Also Note the following: If you experience worsening of your admission symptoms, develop shortness of breath, life threatening emergency, suicidal or homicidal thoughts you must seek medical attention immediately by calling 911 or calling your MD immediately  if symptoms less severe.   You must read complete instructions/literature along with all the possible adverse reactions/side effects for all the Medicines you take and that have been prescribed to you. Take any new Medicines after you have completely understood and accpet all the possible adverse reactions/side effects.    Do not drive when taking Pain medications or sleeping medications (Benzodaizepines)   Do not take more than prescribed Pain, Sleep and Anxiety Medications. It is not advisable to combine anxiety,sleep and pain medications without talking with your primary care practitioner   Special Instructions: If you have smoked or chewed Tobacco  in the last 2 yrs please stop smoking, stop any regular Alcohol  and or any Recreational drug use.   Wear Seat belts while driving.   Please  note: You were cared for by a hospitalist during your hospital stay. Once you are discharged, your primary care physician will handle any further medical issues. Please note that NO REFILLS for any discharge medications will be authorized once  you are discharged, as it is imperative that you return to your primary care physician (or establish a relationship with a primary care physician if you do not have one) for your post hospital discharge needs so that they can reassess your need for medications and monitor your lab values  Time coordinating discharge: Over 30 minutes  SIGNED:   Hughie Closs, MD  [facility] 09/19/2023, 12:22 PM *Please note that this is a verbal dictation therefore any spelling or grammatical errors are due to the "Dragon [employer] One" system interpretation. If 7PM-7AM, please contact night-coverage [email or web address]

## 2023-09-19 NOTE — Discharge Instructions (Signed)
[facility] ______[facility] LAPAROSCOPIC SURGERY: POST OP INSTRUCTIONS Always review your discharge instruction sheet given to you by the facility where your surgery was performed. IF YOU HAVE DISABILITY OR FAMILY LEAVE FORMS, YOU MUST BRING THEM TO THE OFFICE FOR PROCESSING.   DO NOT GIVE THEM TO YOUR DOCTOR.  A prescription for pain medication may be given to you upon discharge.  Take your pain medication as prescribed, if needed.  If narcotic pain medicine is not needed, then you may take acetaminophen (Tylenol) or ibuprofen (Advil) as needed. Take your usually prescribed medications unless otherwise directed. If you need a refill on your pain medication, please contact your pharmacy.  They will contact our office to request authorization. Prescriptions will not be filled after 5pm or on week-ends. You should follow a light diet the first few days after arrival home, such as soup and crackers, etc.  Be sure to include lots of fluids daily. Most patients will experience some swelling and bruising in the area of the incisions.  Ice packs will help.  Swelling and bruising can take several days to resolve.  It is common to experience some constipation if taking pain medication after surgery.  Increasing fluid intake and taking a stool softener (such as Colace) will usually help or prevent this problem from occurring.  A mild laxative (Milk of Magnesia or Miralax) should be taken according to package instructions if there are no bowel movements after 48 hours. Unless discharge instructions indicate otherwise, you may remove your bandages 24-48 hours after surgery, and you may shower at that time.  You may have steri-strips (small skin tapes) in place directly over the incision.  These strips should be left on the skin for 7-10 days.  If your surgeon used skin glue on the incision, you may shower in 24 hours.  The glue will flake off over the next 2-3 weeks.  Any sutures or staples will be  removed at the office during your follow-up visit. ACTIVITIES:  You may resume regular (light) daily activities beginning the next day--such as daily self-care, walking, climbing stairs--gradually increasing activities as tolerated.  You may have sexual intercourse when it is comfortable.  Refrain from any heavy lifting or straining until approved by your doctor. You may drive when you are no longer taking prescription pain medication, you can comfortably wear a seatbelt, and you can safely maneuver your car and apply brakes. RETURN TO WORK:  __________________________________________________________ Martin Rubio should see your doctor in the office for a follow-up appointment approximately 2-3 weeks after your surgery.  Make sure that you call for this appointment within a day or two after you arrive home to insure a convenient appointment time. OTHER INSTRUCTIONS: YOU MAY SHOWER STARTING TODAY ICE PACK, TYLENOL ALSO FOR PAIN NO LIFTING MORE THAN 15 POUNDS FOR 2 WEEKS __________________________________________________________________________________________________________________________ __________________________________________________________________________________________________________________________ WHEN TO CALL YOUR DOCTOR: Fever over 101.0 Inability to urinate Continued bleeding from incision. Increased pain, redness, or drainage from the incision. Increasing abdominal pain  The clinic staff is available to answer your questions during regular business hours.  Please don't hesitate to call and ask to speak to one of the nurses for clinical concerns.  If you have a medical emergency, go to the nearest emergency room or call 911.  A surgeon from [facility] is always on call at the hospital. [address], [address], [place], [place]  [address] ? [address] [address], [place], [place]   [address] [phone] ? [phone] ? [phone] Web site:  [email or web address]

## 2023-09-19 NOTE — Progress Notes (Signed)
Discharge order received. AVS printed and given to patient. IV removed. Patient to be transported home by his wife.

## 2023-09-19 NOTE — Progress Notes (Signed)
1 Day Post-Op   Subjective/Chief Complaint: Comfortable this morning Tolerating po   Objective: Vital signs in last 24 hours: Temp:  [97.5 F (36.4 C)-98.6 F (37 C)] 98.2 F (36.8 C) (10/13 0800) Pulse Rate:  [57-68] 57 (10/13 0800) Resp:  [12-18] 16 (10/13 0800) BP: (121-139)/(61-85) 128/63 (10/13 0800) SpO2:  [90 %-97 %] 92 % (10/13 0800) Weight:  [96.2 kg] 96.2 kg (10/12 1005) Last BM Date : 09/16/23  Intake/Output from previous day: 10/12 0701 - 10/13 0700 In: 1000 [P.O.:600; I.V.:300; IV Piggyback:100] Out: 2275 [Urine:2250; Blood:25] Intake/Output this shift: Total I/O In: 240 [P.O.:240] Out: 350 [Urine:350]  Exam: Awake and alert Comfortable Abdomen soft, obese, appropriately tender, incisions clean  Lab Results:  Recent Labs    09/17/23 0454 09/18/23 0327  WBC 6.4 7.3  HGB 15.9 15.0  HCT 48.6 46.7  PLT 133* 149*   BMET Recent Labs    09/17/23 0454 09/18/23 0327  NA 140 139  K 4.3 4.3  CL 103 104  CO2 27 29  GLUCOSE 94 95  BUN 16 12  CREATININE 1.64* 1.74*  CALCIUM 9.3 9.1   PT/INR No results for input(s): "LABPROT", "INR" in the last 72 hours. ABG No results for input(s): "PHART", "HCO3" in the last 72 hours.  Invalid input(s): "PCO2", "PO2"  Studies/Results: DG Cholangiogram Operative  Result Date: 09/18/2023 CLINICAL DATA:  Laparoscopic cholecystectomy with intraoperative cholangiogram EXAM: INTRAOPERATIVE CHOLANGIOGRAM TECHNIQUE: Cholangiographic images from the C-arm fluoroscopic device were submitted for interpretation post-operatively. Please see the procedural report for the amount of contrast and the fluoroscopy time utilized. FLUOROSCOPY: Radiation Exposure Index (as provided by the fluoroscopic device): 5.3788 mGy Kerma COMPARISON:  Prior abdominal ultrasound 09/17/2023 FINDINGS: Intraoperative saved cine clip an image are submitted for review. The images demonstrate cannulation of the cystic duct remanent with intraoperative  cholangiogram. No evidence of biliary ductal dilatation, stenosis, stricture or choledocholithiasis. Contrast material passes freely through the ampulla and into the duodenum. IMPRESSION: Negative intraoperative cholangiogram. Electronically Signed   By: Malachy Moan M.D.   On: 09/18/2023 15:38    Anti-infectives: Anti-infectives (From admission, onward)    Start     Dose/Rate Route Frequency Ordered Stop   09/18/23 0956  ceFAZolin (ANCEF) 2-4 GM/100ML-% IVPB       Note to Pharmacy: Kathrene Bongo D: cabinet override      09/18/23 0956 09/18/23 1122   09/18/23 0900  ceFAZolin (ANCEF) IVPB 2g/100 mL premix        2 g 200 mL/hr over 30 Minutes Intravenous On call to O.R. 09/18/23 0804 09/18/23 1150       Assessment/Plan: POD 1 s/p lap chole and IOC LK  -ok for discharge from a surgical standpoint -can resume [facility] this evening  Abigail Miyamoto MD 09/19/2023

## 2023-09-19 NOTE — Plan of Care (Signed)
Problem: Education: Goal: Knowledge of General Education information will improve Description: Including pain rating scale, medication(s)/side effects and non-pharmacologic comfort measures Outcome: Completed/Met

## 2023-09-19 NOTE — Plan of Care (Signed)
  Problem: Education: Goal: Understanding of disease, treatment, and recovery process will improve Outcome: Completed/Met   Problem: Activity: Goal: Ability to return to baseline activity level will improve Outcome: Completed/Met   Problem: Cardiac: Goal: Ability to maintain adequate cardiovascular perfusion will improve Outcome: Completed/Met Goal: Vascular access site(s) Level 0-1 will be maintained Outcome: Completed/Met   Problem: Health Behavior/ Discharge Planning: Goal: Ability to safely manage health related needs after discharge Outcome: Completed/Met   Problem: Health Behavior/Discharge Planning: Goal: Ability to manage health-related needs will improve Outcome: Completed/Met   Problem: Clinical Measurements: Goal: Ability to maintain clinical measurements within normal limits will improve Outcome: Completed/Met Goal: Will remain free from infection Outcome: Completed/Met Goal: Diagnostic test results will improve Outcome: Completed/Met Goal: Respiratory complications will improve Outcome: Completed/Met Goal: Cardiovascular complication will be avoided Outcome: Completed/Met   Problem: Activity: Goal: Risk for activity intolerance will decrease Outcome: Completed/Met   Problem: Nutrition: Goal: Adequate nutrition will be maintained Outcome: Completed/Met   Problem: Coping: Goal: Level of anxiety will decrease Outcome: Completed/Met   Problem: Elimination: Goal: Will not experience complications related to bowel motility Outcome: Completed/Met Goal: Will not experience complications related to urinary retention Outcome: Completed/Met   Problem: Pain Managment: Goal: General experience of comfort will improve Outcome: Completed/Met   Problem: Safety: Goal: Ability to remain free from injury will improve Outcome: Completed/Met   Problem: Skin Integrity: Goal: Risk for impaired skin integrity will decrease Outcome: Completed/Met

## 2023-09-20 ENCOUNTER — Ambulatory Visit: Payer: Medicare Other | Admitting: Internal Medicine

## 2023-09-21 ENCOUNTER — Telehealth: Payer: Self-pay | Admitting: *Deleted

## 2023-09-21 ENCOUNTER — Encounter: Payer: Medicare Other | Admitting: Cardiovascular Disease

## 2023-09-21 LAB — SURGICAL PATHOLOGY

## 2023-09-21 NOTE — Transitions of Care (Post Inpatient/ED Visit) (Signed)
09/21/2023  Name: Martin Rubio MRN: [id number] DOB: 09-Jan-1946  Today's TOC FU Call Status: Today's TOC FU Call Status:: Successful TOC FU Call Completed TOC FU Call Complete Date: 09/21/23 Patient's Name and Date of Birth confirmed.  Transition Care Management Follow-up Telephone Call Date of Discharge: 09/19/23 Discharge Facility: [facility]) Type of Discharge: Inpatient Admission Primary Inpatient Discharge Diagnosis:: acute gallstones with laprascopic cholecystectomy How have you been since you were released from the hospital?: Better ("I am doing fine, but this incision is starting to hurt more today than it did yesterday; I guess that is normal.  It's bearable.  I'll make the appointment with the surgeon, thanks for getting me the appointment with Dr. Drue Novel for tomorrow") Any questions or concerns?: No  Items Reviewed: Did you receive and understand the discharge instructions provided?: Yes (thoroughly reviewed with patient who verbalizes good understanding of same) Medications obtained,verified, and reconciled?: Yes (Medications Reviewed) (Full medication reconciliation/ review completed; no concerns or discrepancies identified; confirmed patient obtained/ is taking all newly Rx'd medications as instructed; self-manages medications and denies questions/ concerns around medications today) Any new allergies since your discharge?: No Dietary orders reviewed?: Yes Type of Diet Ordered:: "Light foods, bland foods" Do you have support at home?: Yes People in Home: spouse Name of Support/Comfort Primary Source: Reports independent in self-care activities; supportive spouse assists as/ if needed/ indicated  Medications Reviewed Today: Medications Reviewed Today     Reviewed by Michaela Corner, RN (Registered Nurse) on 09/21/23 at 1609  Med List Status: <None>   Medication Order Taking? Sig Documenting Provider Last Dose Status Informant  amLODipine (NORVASC) 5 MG tablet  [id number] Yes Take 1 tablet (5 mg total) by mouth daily. Rollene Rotunda, MD Taking Active Self, Pharmacy Records  cholecalciferol (VITAMIN D3) 25 MCG (1000 UNIT) tablet [id number] Yes Take 2,000 Units by mouth daily in the afternoon. [provider] Taking Active Self, Pharmacy Records  Cyanocobalamin (VITAMIN B 12) 500 MCG TABS [id number] Yes Take 1,000 mcg by mouth daily in the afternoon. Wanda Plump, MD Taking Active Self, Pharmacy Records  diclofenac Sodium (VOLTAREN) 1 % GEL [id number] Yes Apply 4 g topically in the morning and at bedtime.  Patient taking differently: Apply 4 g topically 2 (two) times daily as needed (knee pain.).   Wanda Plump, MD Taking Active Self, Pharmacy Records  dofetilide [facility]) 250 MCG capsule [id number] Yes TAKE ONE CAPSULE BY MOUTH TWICE A Kenn File, MD Taking Active Self, Pharmacy Records  Evolocumab [facility] SURECLICK) 140 MG/ML Ivory Broad [id number] Yes Inject 140 mg into the skin every 14 (fourteen) days. Iran Ouch, MD Taking Active Self, Pharmacy Records  ferrous fumarate (FERRETTS) 325 (106 Fe) MG TABS tablet [id number] Yes Take 1 tablet (106 mg of iron total) by mouth 2 (two) times daily.  Patient taking differently: Take 1 tablet by mouth daily.   Wanda Plump, MD Taking Active Self, Pharmacy Records  fluticasone [facility]) 50 MCG/ACT nasal spray [id number] Yes Place 1 spray into both nostrils as needed for allergies. [provider] Taking Active Self, Pharmacy Records           Med Note Revonda Standard, AMBER B   Tue Mar 05, 2020  1:54 PM)    hydrOXYzine (VISTARIL) 25 MG capsule [id number] Yes Take 1 capsule (25 mg total) by mouth every 8 (eight) hours as needed. Wanda Plump, MD Taking Active Self, Pharmacy Records  levothyroxine (SYNTHROID) 137 MCG tablet [id number] Yes Take 1 tablet (  137 mcg total) by mouth daily before breakfast. Wanda Plump, MD Taking Active Self, Pharmacy Records  losartan (COZAAR) 50 MG tablet [id number] Yes TAKE ONE  TABLET BY MOUTH EVERY MORNING AND TAKE ONE TABLET BY MOUTH AT BEDTIME Iran Ouch, MD Taking Active Self, Pharmacy Records  meclizine (ANTIVERT) 25 MG tablet [id number] Yes Take 25 mg by mouth 3 (three) times daily as needed for dizziness. [provider] Taking Active Self, Pharmacy Records           Med Note Sueanne Margarita Dec 22, 2022  9:25 AM)    Melatonin 5 MG TABS [id number] Yes Take 5 mg by mouth at bedtime. [provider] Taking Active Self, Pharmacy Records           Med Note Revonda Standard, AMBER B   Tue Mar 05, 2020  1:57 PM)    methocarbamol (ROBAXIN) 500 MG tablet [id number] Yes Take 1 tablet (500 mg total) by mouth every 8 (eight) hours as needed for muscle spasms. Lenda Kelp, MD Taking Active Self, Pharmacy Records  OVER THE COUNTER MEDICATION [id number] Yes Take 2 capsules by mouth daily in the afternoon. Nervprin - Healthy Nerve Support - Peripheral Neuropathy Relief [provider] Taking Active Self, Pharmacy Records  oxybutynin (DITROPAN-XL) 10 MG 24 hr tablet [id number] Yes Take 10 mg by mouth at bedtime. [provider] Taking Active Self, Pharmacy Records  oxyCODONE (OXY IR/ROXICODONE) 5 MG immediate release tablet [id number] Yes Take 1 tablet (5 mg total) by mouth every 4 (four) hours as needed for severe pain. Hughie Closs, MD Taking Active   pantoprazole (PROTONIX) 40 MG tablet [id number] Yes Take 1 tablet (40 mg total) by mouth daily. Wanda Plump, MD Taking Active Self, Pharmacy Records  pregabalin (LYRICA) 50 MG capsule [id number] Yes Take 50 mg by mouth at bedtime as needed. [provider] Taking Active Self, Pharmacy Records  rivaroxaban (XARELTO) 20 MG TABS tablet [id number] Yes Take 1 tablet (20 mg total) by mouth daily with supper. Dose change, please call [phone] Mealor, Roberts Gaudy, MD Taking Active Self, Pharmacy Records  tadalafil (CIALIS) 20 MG tablet [id number] Yes Take 20 mg by mouth daily as needed for  erectile dysfunction. [provider] Taking Active Self, Pharmacy Records  tamsulosin (FLOMAX) 0.4 MG CAPS capsule [id number] Yes Take 0.4 mg by mouth at bedtime. [provider] Taking Active Self, Pharmacy Records  triamcinolone cream (KENALOG) 0.1 % [id number] Yes Apply 1 application topically daily as needed for itching. [provider] Taking Active Self, Pharmacy Records           Med Note Henreitta Leber, Shireen Quan   Fri Oct 18, 2020 11:12 AM)             Home Care and Equipment/Supplies: Were Home Health Services Ordered?: No Any new equipment or medical supplies ordered?: No  Functional Questionnaire: Do you need assistance with bathing/showering or dressing?: No Do you need assistance with meal preparation?: No Do you need assistance with eating?: No Do you have difficulty maintaining continence: No Do you need assistance with getting out of bed/getting out of a chair/moving?: No Do you have difficulty managing or taking your medications?: No  Follow up appointments reviewed: PCP Follow-up appointment confirmed?: Yes Date of PCP follow-up appointment?: 09/22/23 Follow-up Provider: PCP Specialist Hospital Follow-up appointment confirmed?: No Reason Specialist Follow-Up Not Confirmed: Patient has Specialist Provider Number and will Call for Appointment Do you need transportation to your follow-up appointment?:  No Do you understand care options if your condition(s) worsen?: Yes-patient verbalized understanding  SDOH Interventions Today    Flowsheet Row Most Recent Value  SDOH Interventions   Food Insecurity Interventions Intervention Not Indicated  Transportation Interventions Intervention Not Indicated  [drives self,  wife can assist as needed]      TOC Interventions Today    Flowsheet Row Most Recent Value  TOC Interventions   TOC Interventions Discussed/Reviewed TOC Interventions Discussed, Post op wound/incision care  [Patient declines  need for ongoing/ further care management/ coordination outreach,  no needs identified at time of TOC call today- declines enrollment in 30-day TOC program]      Interventions Today    Flowsheet Row Most Recent Value  Chronic Disease   Chronic disease during today's visit Other  [Acute gallstones,  laproscopic cholecystectomy]  General Interventions   General Interventions Discussed/Reviewed General Interventions Discussed, Doctor Visits, Durable Medical Equipment (DME)  Doctor Visits Discussed/Reviewed Doctor Visits Discussed, PCP, Specialist  Durable Medical Equipment (DME) Other  [confirmed not currently requiring/ using assistive devices for ambulation]  PCP/Specialist Visits Compliance with follow-up visit  Exercise Interventions   Exercise Discussed/Reviewed Exercise Discussed  [benefit of conservative post-op activity,  need to pace activity without over-doing]  Nutrition Interventions   Nutrition Discussed/Reviewed Nutrition Discussed  Pharmacy Interventions   Pharmacy Dicussed/Reviewed Pharmacy Topics Discussed  [Full medication review with updating medication list in EHR per patient report]  Safety Interventions   Safety Discussed/Reviewed Safety Discussed      Caryl Pina, RN, BSN, CCRN Alumnus RN Care Manager  Transitions of Care  [facility]  [place] [phone]: direct office

## 2023-09-22 ENCOUNTER — Emergency Department (HOSPITAL_BASED_OUTPATIENT_CLINIC_OR_DEPARTMENT_OTHER)
Admission: EM | Admit: 2023-09-22 | Discharge: 2023-09-22 | Disposition: A | Discharge status: Home / Self Care | Payer: Medicare Other | Attending: Emergency Medicine | Admitting: Emergency Medicine

## 2023-09-22 ENCOUNTER — Ambulatory Visit (INDEPENDENT_AMBULATORY_CARE_PROVIDER_SITE_OTHER): Payer: Medicare Other | Admitting: Internal Medicine

## 2023-09-22 ENCOUNTER — Encounter: Payer: Self-pay | Admitting: Internal Medicine

## 2023-09-22 ENCOUNTER — Encounter (HOSPITAL_BASED_OUTPATIENT_CLINIC_OR_DEPARTMENT_OTHER): Payer: Self-pay

## 2023-09-22 ENCOUNTER — Encounter (HOSPITAL_BASED_OUTPATIENT_CLINIC_OR_DEPARTMENT_OTHER): Payer: Self-pay | Admitting: Emergency Medicine

## 2023-09-22 VITALS — BP 134/80 | HR 83 | Temp 97.6°F | Resp 18 | Ht 70.0 in | Wt 212.4 lb

## 2023-09-22 DIAGNOSIS — K5900 Constipation, unspecified: Secondary | ICD-10-CM | POA: Insufficient documentation

## 2023-09-22 DIAGNOSIS — E039 Hypothyroidism, unspecified: Secondary | ICD-10-CM | POA: Diagnosis not present

## 2023-09-22 DIAGNOSIS — Z79899 Other long term (current) drug therapy: Secondary | ICD-10-CM | POA: Insufficient documentation

## 2023-09-22 DIAGNOSIS — Z7901 Long term (current) use of anticoagulants: Secondary | ICD-10-CM | POA: Diagnosis not present

## 2023-09-22 DIAGNOSIS — Z950 Presence of cardiac pacemaker: Secondary | ICD-10-CM | POA: Insufficient documentation

## 2023-09-22 DIAGNOSIS — R1030 Lower abdominal pain, unspecified: Secondary | ICD-10-CM | POA: Diagnosis not present

## 2023-09-22 DIAGNOSIS — R1084 Generalized abdominal pain: Secondary | ICD-10-CM | POA: Diagnosis present

## 2023-09-22 DIAGNOSIS — K5641 Fecal impaction: Secondary | ICD-10-CM | POA: Diagnosis not present

## 2023-09-22 DIAGNOSIS — N4889 Other specified disorders of penis: Secondary | ICD-10-CM | POA: Diagnosis not present

## 2023-09-22 DIAGNOSIS — K851 Biliary acute pancreatitis without necrosis or infection: Secondary | ICD-10-CM

## 2023-09-22 DIAGNOSIS — N320 Bladder-neck obstruction: Secondary | ICD-10-CM | POA: Diagnosis not present

## 2023-09-22 DIAGNOSIS — I10 Essential (primary) hypertension: Secondary | ICD-10-CM | POA: Insufficient documentation

## 2023-09-22 DIAGNOSIS — R339 Retention of urine, unspecified: Secondary | ICD-10-CM

## 2023-09-22 DIAGNOSIS — R21 Rash and other nonspecific skin eruption: Secondary | ICD-10-CM | POA: Diagnosis not present

## 2023-09-22 LAB — CBC WITH DIFFERENTIAL/PLATELET
Abs Immature Granulocytes: 0.03 10*3/uL (ref 0.00–0.07)
Basophils Absolute: 0.1 10*3/uL (ref 0.0–0.1)
Basophils Relative: 1 %
Eosinophils Absolute: 0.5 10*3/uL (ref 0.0–0.5)
Eosinophils Relative: 8 %
HCT: 47.2 % (ref 39.0–52.0)
Hemoglobin: 15.7 g/dL (ref 13.0–17.0)
Immature Granulocytes: 1 %
Lymphocytes Relative: 18 %
Lymphs Abs: 1.1 10*3/uL (ref 0.7–4.0)
MCH: 28.2 pg (ref 26.0–34.0)
MCHC: 33.3 g/dL (ref 30.0–36.0)
MCV: 84.7 fL (ref 80.0–100.0)
Monocytes Absolute: 0.7 10*3/uL (ref 0.1–1.0)
Monocytes Relative: 12 %
Neutro Abs: 3.7 10*3/uL (ref 1.7–7.7)
Neutrophils Relative %: 60 %
Platelets: 134 10*3/uL — ABNORMAL LOW (ref 150–400)
RBC: 5.57 MIL/uL (ref 4.22–5.81)
RDW: 13.5 % (ref 11.5–15.5)
WBC: 6 10*3/uL (ref 4.0–10.5)
nRBC: 0 % (ref 0.0–0.2)

## 2023-09-22 LAB — COMPREHENSIVE METABOLIC PANEL
ALT: 37 U/L (ref 0–44)
AST: 31 U/L (ref 15–41)
Albumin: 3.8 g/dL (ref 3.5–5.0)
Alkaline Phosphatase: 70 U/L (ref 38–126)
Anion gap: 12 (ref 5–15)
BUN: 18 mg/dL (ref 8–23)
CO2: 24 mmol/L (ref 22–32)
Calcium: 9 mg/dL (ref 8.9–10.3)
Chloride: 101 mmol/L (ref 98–111)
Creatinine, Ser: 1.54 mg/dL — ABNORMAL HIGH (ref 0.61–1.24)
GFR, Estimated: 46 mL/min — ABNORMAL LOW (ref >60)
Glucose, Bld: 97 mg/dL (ref 70–99)
Potassium: 4 mmol/L (ref 3.5–5.1)
Sodium: 137 mmol/L (ref 135–145)
Total Bilirubin: 0.8 mg/dL (ref 0.3–1.2)
Total Protein: 6.6 g/dL (ref 6.5–8.1)

## 2023-09-22 LAB — URINALYSIS, MICROSCOPIC (REFLEX)

## 2023-09-22 LAB — URINALYSIS, ROUTINE W REFLEX MICROSCOPIC
Bilirubin Urine: NEGATIVE
Glucose, UA: NEGATIVE mg/dL
Ketones, ur: NEGATIVE mg/dL
Leukocytes,Ua: NEGATIVE
Nitrite: NEGATIVE
Protein, ur: 100 mg/dL — AB
Specific Gravity, Urine: 1.01 (ref 1.005–1.030)
pH: 6 (ref 5.0–8.0)

## 2023-09-22 LAB — LIPASE, BLOOD: Lipase: 27 U/L (ref 11–51)

## 2023-09-22 LAB — LACTIC ACID, PLASMA: Lactic Acid, Venous: 0.8 mmol/L (ref 0.5–1.9)

## 2023-09-22 MED ORDER — OXYCODONE HCL 5 MG PO TABS
5.0000 mg | ORAL_TABLET | Freq: Once | ORAL | Status: AC
  Administered 2023-09-22: 5 mg via ORAL
  Filled 2023-09-22: qty 1

## 2023-09-22 MED ORDER — POLYETHYLENE GLYCOL 3350 17 G PO PACK
17.0000 g | PACK | Freq: Once | ORAL | Status: AC
  Administered 2023-09-22: 17 g via ORAL
  Filled 2023-09-22: qty 1

## 2023-09-22 NOTE — Assessment & Plan Note (Signed)
Acute gallstone pancreatitis. Here for follow-up, surgery was 09/18/2023. Discharged on essentially the same medications. Presents today for follow-up, having abdominal pain, has not have a bowel movement since before the surgery. Today he has been unable to urinate. On DRE there is abundant - although no hard-  stools. Impression: Urinary retention, fecal impaction and possibly early SBO although the abdomen is not very distended.   (Noting last oxycodone intake was more than 30 hours ago). Discussed situation with the ER physician who agreed to see him and evaluate.  Appreciate his help.

## 2023-09-22 NOTE — ED Provider Notes (Signed)
[facility] Provider Note   CSN: [id number] Arrival date & time: 09/22/23  1632     History  Chief Complaint  Patient presents with   Penis Pain    Martin Rubio is a 76 y.o. male with past medical history significant for urinary retention and BPH with obstruction presents today for penile pain.  Patient states he had a Foley catheter placed this morning by a nurse who did not leave the catheter prior to insertion.  He has had penile pain since.  Patient took Tylenol with no relief.  Patient says that his catheter is draining urine into his leg bag without difficulty.  Patient denies hematuria or blood around catheter insertion site.   Penis Pain       Home Medications Prior to Admission medications   Medication Sig Start Date End Date Taking? Authorizing Provider  amLODipine (NORVASC) 5 MG tablet Take 1 tablet (5 mg total) by mouth daily. 03/09/23   Rollene Rotunda, MD  cholecalciferol (VITAMIN D3) 25 MCG (1000 UNIT) tablet Take 2,000 Units by mouth daily in the afternoon.    [provider]  Cyanocobalamin (VITAMIN B 12) 500 MCG TABS Take 1,000 mcg by mouth daily in the afternoon. 02/29/20   Wanda Plump, MD  diclofenac Sodium (VOLTAREN) 1 % GEL Apply 4 g topically in the morning and at bedtime. Patient not taking: Reported on 09/22/2023 12/16/21   Wanda Plump, MD  dofetilide (TIKOSYN) 250 MCG capsule TAKE ONE CAPSULE BY MOUTH TWICE A DAY 09/25/22   Rollene Rotunda, MD  Evolocumab (REPATHA SURECLICK) 140 MG/ML SOAJ Inject 140 mg into the skin every 14 (fourteen) days. 03/15/23   Iran Ouch, MD  ferrous fumarate (FERRETTS) 325 (106 Fe) MG TABS tablet Take 1 tablet (106 mg of iron total) by mouth 2 (two) times daily. Patient taking differently: Take 1 tablet by mouth daily. 08/30/23   Wanda Plump, MD  fluticasone [facility]) 50 MCG/ACT nasal spray Place 1 spray into both nostrils as needed for allergies.    [provider]  hydrOXYzine (VISTARIL) 25 MG capsule Take 1 capsule (25 mg total) by mouth every 8 (eight) hours as needed. 08/02/23   Wanda Plump, MD  levothyroxine (SYNTHROID) 137 MCG tablet Take 1 tablet (137 mcg total) by mouth daily before breakfast. 08/30/23   Wanda Plump, MD  losartan (COZAAR) 50 MG tablet TAKE ONE TABLET BY MOUTH EVERY MORNING AND TAKE ONE TABLET BY MOUTH AT BEDTIME 06/14/23   Iran Ouch, MD  meclizine (ANTIVERT) 25 MG tablet Take 25 mg by mouth 3 (three) times daily as needed for dizziness. Patient not taking: Reported on 09/22/2023 01/19/18   [provider]  Melatonin 5 MG TABS Take 5 mg by mouth at bedtime.    [provider]  methocarbamol (ROBAXIN) 500 MG tablet Take 1 tablet (500 mg total) by mouth every 8 (eight) hours as needed for muscle spasms. Patient not taking: Reported on 09/22/2023 05/11/23   Lenda Kelp, MD  OVER THE COUNTER MEDICATION Take 2 capsules by mouth daily in the afternoon. Nervprin - Healthy Nerve Support - Peripheral Neuropathy Relief    [provider]  oxybutynin (DITROPAN-XL) 10 MG 24 hr tablet Take 10 mg by mouth at bedtime. 11/04/22   [provider]  oxyCODONE (OXY IR/ROXICODONE) 5 MG immediate release tablet Take 1 tablet (5 mg total) by mouth every 4 (four) hours as needed for severe  pain. Patient not taking: Reported on 09/22/2023 09/19/23   Hughie Closs, MD  pantoprazole (PROTONIX) 40 MG tablet Take 1 tablet (40 mg total) by mouth daily. 06/07/23   Wanda Plump, MD  pregabalin (LYRICA) 50 MG capsule Take 50 mg by mouth at bedtime as needed. 04/17/22   [provider]  rivaroxaban (XARELTO) 20 MG TABS tablet Take 1 tablet (20 mg total) by mouth daily with supper. Dose change, please call [phone] 06/08/23   Mealor, Roberts Gaudy, MD  tadalafil (CIALIS) 20 MG tablet Take 20 mg by mouth daily as needed for erectile dysfunction. 10/01/22   [provider]  tamsulosin (FLOMAX) 0.4 MG  CAPS capsule Take 0.4 mg by mouth at bedtime. 03/01/19   [provider]  triamcinolone cream (KENALOG) 0.1 % Apply 1 application topically daily as needed for itching. Patient not taking: Reported on 09/22/2023 08/22/19   [provider]      Allergies    Codeine, Nsaids, Prednisone, Statins, and Sulfa antibiotics    Review of Systems   Review of Systems  Genitourinary:  Positive for penile pain.    Physical Exam Updated Vital Signs BP (!) 151/64 (BP Location: Left Arm)   Pulse 66   Temp 98.6 F (37 C) (Oral)   Resp 20   SpO2 97%  Physical Exam Vitals and nursing note reviewed. Exam conducted with a chaperone present.  Constitutional:      General: He is not in acute distress.    Appearance: He is well-developed.  HENT:     Head: Normocephalic and atraumatic.  Eyes:     Conjunctiva/sclera: Conjunctivae normal.  Cardiovascular:     Rate and Rhythm: Normal rate and regular rhythm.     Heart sounds: No murmur heard. Pulmonary:     Effort: Pulmonary effort is normal. No respiratory distress.     Breath sounds: Normal breath sounds.  Abdominal:     Palpations: Abdomen is soft.     Tenderness: There is no abdominal tenderness.  Genitourinary:    Penis: Circumcised. Tenderness present. No erythema, discharge or swelling.      Comments: Presence of Foley catheter draining without difficulty.  No signs of blood in catheter bag or around urethral opening. Musculoskeletal:        General: No swelling.     Cervical back: Neck supple.  Skin:    General: Skin is warm and dry.     Capillary Refill: Capillary refill takes less than 2 seconds.  Neurological:     Mental Status: He is alert.  Psychiatric:        Mood and Affect: Mood normal.     ED Results / Procedures / Treatments   Labs (all labs ordered are listed, but only abnormal results are displayed) Labs Reviewed - No data to display  EKG None  Radiology No results  found.  Procedures Procedures    Medications Ordered in ED Medications  oxyCODONE (Oxy IR/ROXICODONE) immediate release tablet 5 mg (5 mg Oral Given 09/22/23 2219)  polyethylene glycol (MIRALAX / GLYCOLAX) packet 17 g (17 g Oral Given 09/22/23 2219)    ED Course/ Medical Decision Making/ A&P                                 Medical Decision Making Risk OTC drugs. Prescription drug management.   This patient presents to the ED with chief complaint(s) of penile pain with pertinent past  medical history of urinary retention which further complicates the presenting complaint. The complaint involves an extensive differential diagnosis and also carries with it a high risk of complications and morbidity.    The differential diagnosis includes urethral inflammation, urinary retention, misplaced Foley catheter   ED Course and Reassessment: Bladder scan showed 0 mL in bladder Patient given oxycodone for pain   Consultation: - Consulted or discussed management/test interpretation w/ external professional: None  Consideration for admission or further workup: Should follow-up with urology for determination of further use/need of Foley catheter.        Final Clinical Impression(s) / ED Diagnoses Final diagnoses:  Penile pain    Rx / DC Orders ED Discharge Orders     None         Gretta Began 09/22/23 2338    Arby Barrette, MD 10/04/23 [phone]

## 2023-09-22 NOTE — Discharge Instructions (Signed)
Your evaluation today today were consistent with history, exam, and bladder outlet obstruction causing distended bladder attributing to your constipation and lack of bowel movements.  After the bladder was drained and a catheter was placed, you were able to have large bowel movement relieving your discomfort.  The urinalysis did not show convincing evidence of infection at this time but a culture will be sent.  If something grows positive they should call you.  Please follow-up with outpatient urology and your primary doctor.  Given your improvement in symptoms we agreed to hold on CT scan that we initially considered.  Please rest and stay hydrated and for the constipation, if it persist, consider starting some MiraLAX.  If any symptoms change or worsen acutely, please return to the nearest emergency department.

## 2023-09-22 NOTE — ED Triage Notes (Addendum)
Pt had Gall bladder removal on Saturday. Went to PCP today for follow up. Sent to ED due to concerns no BM since prior to sx and difficulty urinating. Able to void this am at 430 am. Denies N/V. Only surgical site tenderness. Able to pass gas Subtle rash noted to lower abdomen

## 2023-09-22 NOTE — ED Provider Notes (Signed)
I provided a substantive portion of the care of this patient.  I personally made/approved the management plan for this patient and take responsibility for the patient management.     Medical screening examination/treatment/procedure(s) were conducted as a shared visit with non-physician practitioner(s) and myself.  I personally evaluated the patient during the encounter.  See separately dictated addendum for shared encounter     Arby Barrette, MD 10/04/23 1204

## 2023-09-22 NOTE — Progress Notes (Signed)
Subjective:    Patient ID: Martin Rubio, male    DOB: 1946/02/04, 77 y.o.   MRN: [id number]  DOS:  09/22/2023 Type of visit - description: HFU  Admitted 09/16/2023, discharged 3 days later. Presented with acute gallstone pancreatitis, had surgery 09/18/2023. Here for follow-up.  Reports he is having generalized abdominal pain for the last couple of days. Last dose of oxycodone was approximately 30 hours ago. He has not have a bowel movement since before the surgery.  Not passing gas regularly. Last time he urinated was 4 AM in the morning today he just tried to urinate few moments ago and he was unable to.  No fever chills No chest pain no difficulty breathing No actual nausea vomiting, he has been eating okay.   Review of Systems See above   Past Medical History:  Diagnosis Date   Bronchitis    Diverticulosis    FH: BPH (benign prostatic hypertrophy)    History of Graves' disease 01/19/2018   S/p ablation   HTN (hypertension)    Hyperlipidemia    Obesity    Psoriatic arthritis (HCC)    Rheumatoid arthritis (HCC) 08/07/2014   Sleep apnea    CPAP   Thyroid disease     Past Surgical History:  Procedure Laterality Date   ABDOMINAL AORTOGRAM W/LOWER EXTREMITY N/A 01/14/2022   Procedure: ABDOMINAL AORTOGRAM W/LOWER EXTREMITY;  Surgeon: Iran Ouch, MD;  Location: [facility];  Service: Cardiovascular;  Laterality: N/A;   ATRIAL FIBRILLATION ABLATION N/A 12/24/2022   Procedure: ATRIAL FIBRILLATION ABLATION;  Surgeon: Maurice Small, MD;  Location: [facility];  Service: Cardiovascular;  Laterality: N/A;   CHOLECYSTECTOMY N/A 09/18/2023   Procedure: LAPAROSCOPIC CHOLECYSTECTOMY WITH INTRAOPERATIVE CHOLANGIOGRAM;  Surgeon: Kinsinger, De Blanch, MD;  Location: [facility];  Service: General;  Laterality: N/A;   INGUINAL HERNIA REPAIR     bilateral   KNEE SURGERY     bilateral arthroscopic   l foot surgery Left 12/2020   PACEMAKER IMPLANT N/A  12/13/2019   Procedure: PACEMAKER IMPLANT;  Surgeon: Marinus Maw, MD;  Location: [facility];  Service: Cardiovascular;  Laterality: N/A;   TRANSURETHRAL RESECTION OF PROSTATE     UMBILICAL HERNIA REPAIR      Current Outpatient Medications  Medication Instructions   amLODipine (NORVASC) 5 mg, Oral, Daily   cholecalciferol (VITAMIN D3) 2,000 Units, Oral, Daily   diclofenac Sodium (VOLTAREN) 4 g, Topical, 2 times daily   dofetilide (TIKOSYN) 250 MCG capsule TAKE ONE CAPSULE BY MOUTH TWICE A DAY   ferrous fumarate (FERRETTS) 325 (106 Fe) MG TABS tablet 106 mg of iron, Oral, 2 times daily   fluticasone (FLONASE) 50 MCG/ACT nasal spray 1 spray, Each Nare, As needed   hydrOXYzine (VISTARIL) 25 mg, Oral, Every 8 hours PRN   levothyroxine (SYNTHROID) 137 mcg, Oral, Daily before breakfast   losartan (COZAAR) 50 MG tablet TAKE ONE TABLET BY MOUTH EVERY MORNING AND TAKE ONE TABLET BY MOUTH AT BEDTIME   meclizine (ANTIVERT) 25 mg, 3 times daily PRN   melatonin 5 mg, Oral, Daily at bedtime   methocarbamol (ROBAXIN) 500 mg, Oral, Every 8 hours PRN   OVER THE COUNTER MEDICATION 2 capsules, Oral, Daily, Nervprin - Healthy Nerve Support - Peripheral Neuropathy Relief    oxybutynin (DITROPAN-XL) 10 mg, Oral, Daily at bedtime   oxyCODONE (OXY IR/ROXICODONE) 5 mg, Oral, Every 4 hours PRN   pantoprazole (PROTONIX) 40 mg, Oral, Daily   pregabalin (LYRICA) 50  mg, Oral, At bedtime PRN   Repatha SureClick 140 mg, Subcutaneous, Every 14 days   rivaroxaban (XARELTO) 20 mg, Oral, Daily with supper, Dose change, please call [phone]   tadalafil (CIALIS) 20 mg, Oral, Daily PRN   tamsulosin (FLOMAX) 0.4 mg, Oral, Daily at bedtime   triamcinolone cream (KENALOG) 0.1 % 1 application , Daily PRN   Vitamin B 12 1,000 mcg, Oral, Daily       Objective:   Physical Exam BP 134/80   Pulse 83   Temp 97.6 F (36.4 C) (Oral)   Resp 18   Ht 5\' 10"  (1.778 m)   Wt 212 lb 6 oz (96.3 kg)   SpO2 98%   BMI  30.47 kg/m  General:   Well developed, NAD, BMI noted.  HEENT:  Normocephalic . Face symmetric, atraumatic Lungs:  CTA B Normal respiratory effort, no intercostal retractions, no accessory muscle use. Heart: RRR,  no murmur.  Abdomen:  Surgical scars noted. abdomen minimally distended. Mild diffuse tenderness to palpation without rebound. Suprapubic area tender, on percussion bladder seems to be slightly distended. DRE: Abundant brown stools found. Skin: Not pale. Not jaundice Lower extremities: no pretibial edema bilaterally  Neurologic:  alert & oriented X3.  Speech normal, gait appropriate for age and unassisted Psych--  Cognition and judgment appear intact.  Cooperative with normal attention span and concentration.  Behavior appropriate. No anxious or depressed appearing.     Assessment   ASSESSMENT  (transfer to me 12/2019) Hyperglycemia HTN High cholesterol, seen at the lipid clinic Hypothyroidism  Psoriatic Arthritis Dr Dierdre Forth  Osteoporosis: on fosamax rx by PCP, start holiday by mid 2022 (see visit note from 02/29/2020) CKD mild,stable . Creatinine ~1.5 CV: -Paroxysmal A. Fib, dx 09/2019 anticoagulated.  Ablation 12/24/2022. -Sick sinus syndrome, s/p pacemaker (12/13/2019) -Septal hypertrophy per echo -Left common iliac aneurysm, AAA  - PVD OSA on CPAP BPH - sees urology HOH  Iron deficiency anemia: 12/02/2021 >> EGD (several polyps suggestive of FGP), C-scope:no polyps, no cancer. Pathology: benign H/o ocular migraines , dx by opht  per pt   PLAN Acute gallstone pancreatitis. Here for follow-up, surgery was 09/18/2023. Discharged on essentially the same medications. Presents today for follow-up, having abdominal pain, has not have a bowel movement since before the surgery. Today he has been unable to urinate. On DRE there is abundant - although no hard-  stools. Impression: Urinary retention, fecal impaction and possibly early SBO although the abdomen is  not very distended.   (Noting last oxycodone intake was more than 30 hours ago). Discussed situation with the ER physician who agreed to see him and evaluate.  Appreciate his help.

## 2023-09-22 NOTE — ED Notes (Signed)
Writer cleansed foley at insertion site and cleansed tip of penis with CHG swab. Writer then deflated catheter balloon and extended foley catheter further into bladder and re-inflated. Pt denied any increased discomfort when re-inflating balloon. Catheter secured on leg. PA notified.

## 2023-09-22 NOTE — Discharge Instructions (Signed)
Today you were treated for penile pain.  Please take Tylenol as needed for pain. Thank you for letting us treat you today. After performing a physical exam, I feel you are safe to go home. Please follow up with your PCP in the next several days and provide them with your records from this visit. Return to the Emergency Room if pain becomes severe or symptoms worsen.

## 2023-09-22 NOTE — ED Provider Notes (Signed)
Martin Rubio Provider Note   CSN: [id number] Arrival date & time: 09/22/23  0957     History  Chief Complaint  Patient presents with   Urinary Frequency   Constipation    Martin Rubio is a 77 y.o. male.  The history is provided by the patient and medical records. No language interpreter was used.  Constipation Abdominal Pain Pain location:  Generalized Pain quality: aching   Pain radiates to:  Does not radiate Pain severity:  Moderate Onset quality:  Gradual Timing:  Constant Progression:  Unchanged Chronicity:  New Context: previous surgery (surgery 5 days ago)   Relieved by:  Nothing Ineffective treatments:  None tried Associated symptoms: no cough and no shortness of breath        Home Medications Prior to Admission medications   Medication Sig Start Date End Date Taking? Authorizing Provider  amLODipine (NORVASC) 5 MG tablet Take 1 tablet (5 mg total) by mouth daily. 03/09/23   Rollene Rotunda, MD  cholecalciferol (VITAMIN D3) 25 MCG (1000 UNIT) tablet Take 2,000 Units by mouth daily in the afternoon.    [provider]  Cyanocobalamin (VITAMIN B 12) 500 MCG TABS Take 1,000 mcg by mouth daily in the afternoon. 02/29/20   Wanda Plump, MD  diclofenac Sodium (VOLTAREN) 1 % GEL Apply 4 g topically in the morning and at bedtime. Patient not taking: Reported on 09/22/2023 12/16/21   Wanda Plump, MD  dofetilide (TIKOSYN) 250 MCG capsule TAKE ONE CAPSULE BY MOUTH TWICE A DAY 09/25/22   Rollene Rotunda, MD  Evolocumab (REPATHA SURECLICK) 140 MG/ML SOAJ Inject 140 mg into the skin every 14 (fourteen) days. 03/15/23   Iran Ouch, MD  ferrous fumarate (FERRETTS) 325 (106 Fe) MG TABS tablet Take 1 tablet (106 mg of iron total) by mouth 2 (two) times daily. Patient taking differently: Take 1 tablet by mouth daily. 08/30/23   Wanda Plump, MD  fluticasone [facility]) 50 MCG/ACT nasal spray Place 1 spray into both nostrils as  needed for allergies.    [provider]  hydrOXYzine (VISTARIL) 25 MG capsule Take 1 capsule (25 mg total) by mouth every 8 (eight) hours as needed. 08/02/23   Wanda Plump, MD  levothyroxine (SYNTHROID) 137 MCG tablet Take 1 tablet (137 mcg total) by mouth daily before breakfast. 08/30/23   Wanda Plump, MD  losartan (COZAAR) 50 MG tablet TAKE ONE TABLET BY MOUTH EVERY MORNING AND TAKE ONE TABLET BY MOUTH AT BEDTIME 06/14/23   Iran Ouch, MD  meclizine (ANTIVERT) 25 MG tablet Take 25 mg by mouth 3 (three) times daily as needed for dizziness. Patient not taking: Reported on 09/22/2023 01/19/18   [provider]  Melatonin 5 MG TABS Take 5 mg by mouth at bedtime.    [provider]  methocarbamol (ROBAXIN) 500 MG tablet Take 1 tablet (500 mg total) by mouth every 8 (eight) hours as needed for muscle spasms. Patient not taking: Reported on 09/22/2023 05/11/23   Lenda Kelp, MD  OVER THE COUNTER MEDICATION Take 2 capsules by mouth daily in the afternoon. Nervprin - Healthy Nerve Support - Peripheral Neuropathy Relief    [provider]  oxybutynin (DITROPAN-XL) 10 MG 24 hr tablet Take 10 mg by mouth at bedtime. 11/04/22   [provider]  oxyCODONE (OXY IR/ROXICODONE) 5 MG immediate release tablet Take 1 tablet (5 mg total) by mouth every 4 (four) hours as needed for  severe pain. Patient not taking: Reported on 09/22/2023 09/19/23   Hughie Closs, MD  pantoprazole (PROTONIX) 40 MG tablet Take 1 tablet (40 mg total) by mouth daily. 06/07/23   Wanda Plump, MD  pregabalin (LYRICA) 50 MG capsule Take 50 mg by mouth at bedtime as needed. 04/17/22   [provider]  rivaroxaban (XARELTO) 20 MG TABS tablet Take 1 tablet (20 mg total) by mouth daily with supper. Dose change, please call [phone] 06/08/23   Mealor, Roberts Gaudy, MD  tadalafil (CIALIS) 20 MG tablet Take 20 mg by mouth daily as needed for erectile dysfunction. 10/01/22   [provider]  tamsulosin (FLOMAX) 0.4 MG CAPS capsule Take 0.4 mg by mouth at bedtime. 03/01/19   [provider]  triamcinolone cream (KENALOG) 0.1 % Apply 1 application topically daily as needed for itching. Patient not taking: Reported on 09/22/2023 08/22/19   [provider]      Allergies    Codeine, Nsaids, Prednisone, Statins, and Sulfa antibiotics    Review of Systems   Review of Systems  HENT:  Negative for congestion.   Respiratory:  Negative for cough, chest tightness and shortness of breath.   Skin:  Positive for rash. Negative for wound.  All other systems reviewed and are negative.   Physical Exam Updated Vital Signs There were no vitals taken for this visit. Physical Exam Vitals and nursing note reviewed. Exam conducted with a chaperone present.  Constitutional:      General: He is not in acute distress.    Appearance: He is well-developed. He is not ill-appearing, toxic-appearing or diaphoretic.  HENT:     Head: Normocephalic and atraumatic.     Mouth/Throat:     Mouth: Mucous membranes are moist.  Eyes:     Conjunctiva/sclera: Conjunctivae normal.  Cardiovascular:     Rate and Rhythm: Normal rate and regular rhythm.     Heart sounds: No murmur heard. Pulmonary:     Effort: Pulmonary effort is normal. No respiratory distress.     Breath sounds: Normal breath sounds. No wheezing, rhonchi or rales.  Chest:     Chest wall: No tenderness.  Abdominal:     General: Abdomen is flat. Bowel sounds are decreased.     Palpations: Abdomen is soft.     Tenderness: There is generalized abdominal tenderness. There is no right CVA tenderness, left CVA tenderness, guarding or rebound.     Comments: Diffuse mild abdominal tenderness.  Patient has several surgical sites that have some surrounding erythema and some bumps across the lower abdomen.  No drainage or bleeding.  Bowel sounds decreased.  No inguinal tenderness or groin tenderness on exam.   Genitourinary:     Comments: Rectal exam deferred initially as he just had a DRE upstairs. Musculoskeletal:        General: No swelling or tenderness.     Cervical back: Neck supple.  Skin:    General: Skin is warm and dry.     Capillary Refill: Capillary refill takes less than 2 seconds.     Findings: Rash present. No erythema.  Neurological:     General: No focal deficit present.     Mental Status: He is alert.     Sensory: No sensory deficit.     Motor: No weakness.  Psychiatric:        Mood and Affect: Mood normal.     ED Results / Procedures / Treatments   Labs (all labs ordered are listed,  but only abnormal results are displayed) Labs Reviewed  CBC WITH DIFFERENTIAL/PLATELET - Abnormal; Notable for the following components:      Result Value   Platelets 134 (*)    All other components within normal limits  COMPREHENSIVE METABOLIC PANEL - Abnormal; Notable for the following components:   Creatinine, Ser 1.54 (*)    GFR, Estimated 46 (*)    All other components within normal limits  URINALYSIS, ROUTINE W REFLEX MICROSCOPIC - Abnormal; Notable for the following components:   Hgb urine dipstick TRACE (*)    Protein, ur 100 (*)    All other components within normal limits  URINALYSIS, MICROSCOPIC (REFLEX) - Abnormal; Notable for the following components:   Bacteria, UA RARE (*)    All other components within normal limits  URINE CULTURE  LIPASE, BLOOD  LACTIC ACID, PLASMA  LACTIC ACID, PLASMA    EKG None  Radiology No results found.  Procedures Procedures    Medications Ordered in ED Medications - No data to display  ED Course/ Medical Decision Making/ A&P                                 Medical Decision Making Amount and/or Complexity of Data Reviewed Labs: ordered.    TAVAUGHN SILGUERO is a 77 y.o. male with a past medical history significant for hypertension, hyperlipidemia, iliac aneurysm, hypertrophic cardiomyopathy, paroxysmal atrial fibrillation on Xarelto  therapy, pacemaker, Graves' disease, thyroid disease, sleep apnea, BPH, and recent cholecystectomy 4 days ago who presents from PCP office upstairs with concern for urinary retention and possible bowel obstruction.  According to patient, for the last 4 days he has not had a bowel movement since the surgery and has not had any urination since early this morning which is abnormal for him.  He denies any dysuria or hematuria but feels like he is having pressure in his abdomen that is worsening.  He reports constipation and was passing less gas.  He reports he is having some abdominal discomfort and soreness in his surgical wounds appear well-healing but has some surrounding erythema.  He denies any fevers, chills, chest pain, shortness breath, or cough.  Denies nausea or vomiting.  I spoke to PCP upstairs who sent him down who did a DRE and said that there was some stool present but they did not attempt manual disimpaction at that time.  On my exam, lungs clear.  Chest nontender.  Back nontender.  Flanks nontender.  He does have some abdominal tenderness on exam but I had decreased bowel sounds on my auscultation.  He has several wounds from the ports that have some surrounding erythema but are still closed.  He also has some bumps across his lower abdomen possibly from a mild contact dermatitis.  Does not look like cellulitis at this time.  Chaperone present and groin examined without inguinal tenderness or hernias.  Patient deferred rectal exam initially as he just had 1.  We had a bladder scan that revealed over 400 cc of urine so suspect some degree of bladder outlet obstruction.  Will place a catheter as this enlarged bladder may also contribute to some of the constipation and decreased bowel movement/flatus.  With his recent surgery, will get CT scan to rule out partial bowel obstruction or impaction needing manual disimpaction attempt versus enema.  Will get screening labs.  Will look for UTI after  catheter placement.  He reports pain is  not sharp and does not go from the front to the back and his blood pressure was not significantly elevated today.  Low suspicion for aneurysmal complication as he reports he has had abdominal aneurysm before is being monitored.  Anticipate reassessment after workup to determine disposition.  11:06 AM  Foley catheter placed by nursing and 700 cc of urine removed.  Patient felt so much better he was then able to start having a bowel movement.  We will cancel CT scan initially as the patient is having large bowel movements.  Low suspicion for obstruction now.  Will wait for labs to return and reassess but symptoms may been related to bladder outlet obstruction causing mechanical blockage of his bowels.  Anticipate reassessment after bowel movement and labs.  12:21 PM Patient had large bowel movement and reports resolution of abdominal pain.  He does not want a CT scan.  His labs returned improved from prior and overall reassuring.  Urinalysis did not show infection but he will keep the catheter in place.  He will follow-up with outpatient urology and understood and return precautions.  He had no other questions or concerns and will be discharged for resolution of constipation and discomfort after bladder L obstruction relieved with catheter.           Final Clinical Impression(s) / ED Diagnoses Final diagnoses:  Bladder outlet obstruction  Lower abdominal pain  Constipation, unspecified constipation type    Rx / DC Orders ED Discharge Orders     None       Clinical Impression: 1. Bladder outlet obstruction   2. Lower abdominal pain   3. Constipation, unspecified constipation type     Disposition: Discharge  Condition: Good  I have discussed the results, Dx and Tx plan with the pt(& family if present). He/she/they expressed understanding and agree(s) with the plan. Discharge instructions discussed at great length. Strict return  precautions discussed and pt &/or family have verbalized understanding of the instructions. No further questions at time of discharge.    New Prescriptions   No medications on file    Follow Up: [facility] [address] [place] [address] [phone]       Zacharie Portner, Canary Brim, MD 09/22/23 1224

## 2023-09-22 NOTE — ED Provider Notes (Signed)
I provided a substantive portion of the care of this patient.  I personally made/approved the management plan for this patient and take responsibility for the patient management.     Patient urinary retention and a Foley catheter placed at this morning.  He returns stating he has penile pain.  Patient describes a burning pain in the urethra.  The catheter is draining into the bag.  There is been no bleeding.  Alert nontoxic clinically well in appearance.  No suprapubic firmness.  Catheter appears to be appropriately placed.  Did perform hand-held bedside ultrasound and the catheter balloon is visible and what appears to be decompressed bladder.  Due to patient's significant pain we will deflate the balloon and see if this makes any difference in pain just to make sure the balloon is not situated within the prostatic urethra.  This time I have low suspicion for that based on all clinical findings right now but patient does describe a lot of burning pain.  Adjustments made to the catheter and catheter is draining appropriately.  At this time we will have patient continue use of the catheter and close follow-up with return precautions included.   Arby Barrette, MD 10/04/23 [phone]

## 2023-09-22 NOTE — Patient Instructions (Signed)
Please proceed to the emergency room downstairs.  They are aware of your situation.  Please come back to the office in few days for reassessment.

## 2023-09-22 NOTE — ED Notes (Signed)
Changed cath bag to a leg bag for pt to go home/to be discharged. Pt was educated on leg bag drainage and cath care.

## 2023-09-22 NOTE — ED Triage Notes (Signed)
Pt had foley catheter placed this morning; c/o pain in penis; took Tylenol with no relief; no bleeding noted; urine draining into leg bag w/o difficulty

## 2023-09-22 NOTE — ED Notes (Signed)
Unable to urinate at this time.

## 2023-09-22 NOTE — ED Notes (Signed)
Pt up to [facility], able to have large formed BM

## 2023-09-23 ENCOUNTER — Encounter: Payer: Self-pay | Admitting: Pharmacist

## 2023-09-23 LAB — URINE CULTURE: Culture: NO GROWTH

## 2023-09-24 NOTE — Progress Notes (Signed)
Remote pacemaker transmission.   

## 2023-09-27 DIAGNOSIS — N401 Enlarged prostate with lower urinary tract symptoms: Secondary | ICD-10-CM | POA: Diagnosis not present

## 2023-09-27 DIAGNOSIS — R338 Other retention of urine: Secondary | ICD-10-CM | POA: Diagnosis not present

## 2023-09-28 ENCOUNTER — Encounter: Payer: Self-pay | Admitting: Physician Assistant

## 2023-09-28 ENCOUNTER — Ambulatory Visit (INDEPENDENT_AMBULATORY_CARE_PROVIDER_SITE_OTHER): Payer: Medicare Other | Admitting: Physician Assistant

## 2023-09-28 VITALS — BP 120/80 | HR 65 | Ht 70.0 in | Wt 211.0 lb

## 2023-09-28 DIAGNOSIS — Z9049 Acquired absence of other specified parts of digestive tract: Secondary | ICD-10-CM | POA: Diagnosis not present

## 2023-09-28 DIAGNOSIS — K219 Gastro-esophageal reflux disease without esophagitis: Secondary | ICD-10-CM | POA: Diagnosis not present

## 2023-09-28 DIAGNOSIS — Z8719 Personal history of other diseases of the digestive system: Secondary | ICD-10-CM

## 2023-09-28 MED ORDER — FAMOTIDINE 20 MG PO TABS: 20.0000 mg | ORAL_TABLET | Freq: Every day | ORAL | 11 refills | Status: DC

## 2023-09-28 NOTE — Progress Notes (Addendum)
Chief Complaint: GERD and establish care  HPI:    [name] is a 77 year old male with a past medical history as listed below including complete heart block status post pacemaker placement in 2021, A-fib status post ablation 12/24/2022, PUD, Graves' disease, sleep apnea on CPAP, rheumatoid arthritis, psoriatic arthritis and multiple others, requesting Dr. Barron Alvine, who was referred to me by Wanda Plump, MD for occasional GERD and to establish care.      12/02/2021 EGD at [facility], cannot see report. Same day colonoscopy.    09/14/2023 patient referred to our clinic for nausea and requested Dr. Barron Alvine.  Previous GI history with [facility].    09/16/2023-09/19/2023 patient admitted to the hospital for acute gallstone pancreatitis.  Patient initially had a CT with no evidence of cholecystitis, LFTs elevated initially but improved.  Seen by general surgery and underwent lap cholecystectomy with IOC with no evidence of choledocholithiasis.  Did well postoperatively and was cleared for discharge.    09/22/2023 office visit with PCP for generalized abdominal pain, had been on Oxycodone and had not had a bowel movement since before his surgery.  Then had trouble urinating.  Patient had a DRE with abundant stools.  Postop patient had urinary retention from fecal impaction and possibly early SBO.  Patient sent to the ER.    09/22/2023 ER visit with platelets of 134, creatinine 1.54.  Bladder scan revealed 400 cc of urine.  Patient had a In-N-Out catheter placed.  Patient started having bowel movements.  Abdominal pain resolved.    09/27/2023 patient seen by urology who did a bladder catheterization for benign prostatic hyperplasia with urinary retention.  Foley was then removed.  Patient told to take Flomax.    Today, the patient tells me that he has an extensive GI history including a history of 7 gastric ulcers in the past.  He was previously followed by [facility] but has opted to change  his care to [place].  He just wants to get established here.  Unfortunately I am unable to see a lot of his records in care everywhere.      He describes that currently he is doing okay after the past month during which he has had cholecystectomy, then severe constipation related to opioids.  The only GI complaint he has at the moment is that about 1-2 times a week he will have some breakthrough reflux symptoms regardless of his Pantoprazole 40 mg once daily.  Sounds like he believes over-the-counter Prilosec for those times.  His wife tells me that this does bother him when it happens, but is not very frequent.    Currently keeping his bowel movements steady with a stool softener.    Denies fever, chills, weight loss, blood in his stool, nausea or vomiting.  Past Medical History:  Diagnosis Date   Bronchitis    Diverticulosis    FH: BPH (benign prostatic hypertrophy)    History of Graves' disease 01/19/2018   S/p ablation   HTN (hypertension)    Hyperlipidemia    Obesity    Psoriatic arthritis (HCC)    Rheumatoid arthritis (HCC) 08/07/2014   Sleep apnea    CPAP   Thyroid disease     Past Surgical History:  Procedure Laterality Date   ABDOMINAL AORTOGRAM W/LOWER EXTREMITY N/A 01/14/2022   Procedure: ABDOMINAL AORTOGRAM W/LOWER EXTREMITY;  Surgeon: Iran Ouch, MD;  [facility];  Service: Cardiovascular;  Laterality: N/A;   ATRIAL FIBRILLATION ABLATION N/A  12/24/2022   Procedure: ATRIAL FIBRILLATION ABLATION;  Surgeon: Maurice Small, MD;  Location: [facility] INV[facility] CV [facility];  Service: Cardiovascular;  Laterality: N/A;   CHOLECYSTECTOMY N/A 09/18/2023   Procedure: LAPAROSCOPIC CHOLECYSTECTOMY WITH INTRAOPERATIVE CHOLANGIOGRAM;  Surgeon: Kinsinger, De Blanch, MD;  Location: [facility] OR;  Service: General;  Laterality: N/A;   INGUINAL HERNIA REPAIR     bilateral   KNEE SURGERY     bilateral arthroscopic   l foot surgery Left 12/2020   PACEMAKER IMPLANT N/A 12/13/2019    Procedure: PACEMAKER IMPLANT;  Surgeon: Marinus Maw, MD;  Location: [facility] CV [facility];  Service: Cardiovascular;  Laterality: N/A;   TRANSURETHRAL RESECTION OF PROSTATE     UMBILICAL HERNIA REPAIR      Current Outpatient Medications  Medication Sig Dispense Refill   amLODipine (NORVASC) 5 MG tablet Take 1 tablet (5 mg total) by mouth daily. 90 tablet 3   cholecalciferol (VITAMIN D3) 25 MCG (1000 UNIT) tablet Take 2,000 Units by mouth daily in the afternoon.     Cyanocobalamin (VITAMIN B 12) 500 MCG TABS Take 1,000 mcg by mouth daily in the afternoon.     diclofenac Sodium (VOLTAREN) 1 % GEL Apply 4 g topically in the morning and at bedtime. (Patient not taking: Reported on 09/22/2023) 100 g 6   dofetilide (TIKOSYN) 250 MCG capsule TAKE ONE CAPSULE BY MOUTH TWICE A DAY 180 capsule 3   Evolocumab (REPATHA SURECLICK) 140 MG/ML SOAJ Inject 140 mg into the skin every 14 (fourteen) days. 2 mL 11   ferrous fumarate (FERRETTS) 325 (106 Fe) MG TABS tablet Take 1 tablet (106 mg of iron total) by mouth 2 (two) times daily. (Patient taking differently: Take 1 tablet by mouth daily.) 180 tablet 1   fluticasone (FLONASE) 50 MCG/ACT nasal spray Place 1 spray into both nostrils as needed for allergies.     hydrOXYzine (VISTARIL) 25 MG capsule Take 1 capsule (25 mg total) by mouth every 8 (eight) hours as needed. 60 capsule 3   levothyroxine (SYNTHROID) 137 MCG tablet Take 1 tablet (137 mcg total) by mouth daily before breakfast. 90 tablet 1   losartan (COZAAR) 50 MG tablet TAKE ONE TABLET BY MOUTH EVERY MORNING AND TAKE ONE TABLET BY MOUTH AT BEDTIME 180 tablet 2   meclizine (ANTIVERT) 25 MG tablet Take 25 mg by mouth 3 (three) times daily as needed for dizziness. (Patient not taking: Reported on 09/22/2023)     Melatonin 5 MG TABS Take 5 mg by mouth at bedtime.     methocarbamol (ROBAXIN) 500 MG tablet Take 1 tablet (500 mg total) by mouth every 8 (eight) hours as needed for muscle spasms. (Patient not  taking: Reported on 09/22/2023) 30 tablet 1   OVER THE COUNTER MEDICATION Take 2 capsules by mouth daily in the afternoon. Nervprin - Healthy Nerve Support - Peripheral Neuropathy Relief     oxybutynin (DITROPAN-XL) 10 MG 24 hr tablet Take 10 mg by mouth at bedtime.     oxyCODONE (OXY IR/ROXICODONE) 5 MG immediate release tablet Take 1 tablet (5 mg total) by mouth every 4 (four) hours as needed for severe pain. (Patient not taking: Reported on 09/22/2023) 15 tablet 0   pantoprazole (PROTONIX) 40 MG tablet Take 1 tablet (40 mg total) by mouth daily. 90 tablet 1   pregabalin (LYRICA) 50 MG capsule Take 50 mg by mouth at bedtime as needed.     rivaroxaban (XARELTO) 20 MG TABS tablet Take 1 tablet (20 mg total) by mouth  daily with supper. Dose change, please call [phone] 90 tablet 1   tadalafil (CIALIS) 20 MG tablet Take 20 mg by mouth daily as needed for erectile dysfunction.     tamsulosin (FLOMAX) 0.4 MG CAPS capsule Take 0.4 mg by mouth at bedtime.     triamcinolone cream (KENALOG) 0.1 % Apply 1 application topically daily as needed for itching. (Patient not taking: Reported on 09/22/2023)     No current facility-administered medications for this visit.    Allergies as of 09/28/2023 - Review Complete 09/22/2023  Allergen Reaction Noted   Codeine  09/07/2013   Nsaids Other (See Comments) 01/21/2018   Prednisone Other (See Comments) 01/27/2016   Statins Other (See Comments) 06/09/2011   Sulfa antibiotics Other (See Comments) 05/15/2014    Family History  Problem Relation Age of Onset   CAD Mother 62       CABG   CAD Father 36       Died age 39   Diabetes Brother    Lung cancer Maternal Grandmother    Prostate cancer Neg Hx    Colon cancer Neg Hx     Social History   Socioeconomic History   Marital status: Married    Spouse name: Not on file   Number of children: 2   Years of education: Not on file   Highest education level: Not on file  Occupational History    Occupation: Retired    Associate Professor: [employer]  Tobacco Use   Smoking status: Former    Types: Cigarettes   Smokeless tobacco: Never   Tobacco comments:    Former smoker Quit 46 years ago. 01/21/23  Vaping Use   Vaping status: Never Used  Substance and Sexual Activity   Alcohol use: No   Drug use: No   Sexual activity: Not on file  Other Topics Concern   Not on file  Social History Narrative   Lives with wife.   Social Determinants of Health   Financial Resource Strain: Low Risk  (01/26/2022)   Overall Financial Resource Strain (CARDIA)    Difficulty of Paying Living Expenses: Not hard at all  Food Insecurity: No Food Insecurity (09/21/2023)   Hunger Vital Sign    Worried About Running Out of Food in the Last Year: Never true    Ran Out of Food in the Last Year: Never true  Transportation Needs: No Transportation Needs (09/21/2023)   PRAPARE - Administrator, Civil Service (Medical): No    Lack of Transportation (Non-Medical): No  Physical Activity: Inactive (01/26/2022)   Exercise Vital Sign    Days of Exercise per Week: 0 days    Minutes of Exercise per Session: 0 min  Stress: No Stress Concern Present (01/26/2022)   [employer] of Occupational Health - Occupational Stress Questionnaire    Feeling of Stress : Not at all  Social Connections: Moderately Integrated (01/26/2022)   Social Connection and Isolation Panel [NHANES]    Frequency of Communication with Friends and Family: Three times a week    Frequency of Social Gatherings with Friends and Family: Twice a week    Attends Religious Services: More than 4 times per year    Active Member of [employer] or Organizations: No    Attends Banker Meetings: Never    Marital Status: Married  Catering manager Violence: Not At Risk (09/17/2023)   Humiliation, Afraid, Rape, and Kick questionnaire    Fear of Current or Ex-Partner: No    Emotionally  Abused: No    Physically Abused: No    Sexually  Abused: No    Review of Systems:    Constitutional: No weight loss, fever or chills Skin: No rash Cardiovascular: No chest pain Respiratory: No SOB Gastrointestinal: See HPI and otherwise negative Genitourinary: No dysuria  Neurological: No headache, dizziness or syncope Musculoskeletal: No new muscle or joint pain Hematologic: No bleeding Psychiatric: No history of depression or anxiety   Physical Exam:  Vital signs: BP 120/80   Pulse 65   Ht 5\' 10"  (1.778 m)   Wt 211 lb (95.7 kg)   SpO2 95%   BMI 30.28 kg/m    Constitutional:   Pleasant overweight Caucasian male appears to be in NAD, Well developed, Well nourished, alert and cooperative Head:  Normocephalic and atraumatic. Eyes:   PEERL, EOMI. No icterus. Conjunctiva pink. Ears:  Normal auditory acuity. Neck:  Supple Throat: Oral cavity and pharynx without inflammation, swelling or lesion.  Respiratory: Respirations even and unlabored. Lungs clear to auscultation bilaterally.   No wheezes, crackles, or rhonchi.  Cardiovascular: Normal S1, S2. No MRG. Regular rate and rhythm. No peripheral edema, cyanosis or pallor.  Gastrointestinal:  Soft, nondistended, nontender. No rebound or guarding. Normal bowel sounds. No appreciable masses or hepatomegaly. Rectal:  Not performed.  Msk:  Symmetrical without gross deformities. Without edema, no deformity or joint abnormality.  Neurologic:  Alert and  oriented x4;  grossly normal neurologically.  Skin:   Dry and intact without significant lesions or rashes. Psychiatric:  Demonstrates good judgement and reason without abnormal affect or behaviors.  RELEVANT LABS AND IMAGING: CBC    Component Value Date/Time   WBC 6.0 09/22/2023 1046   RBC 5.57 09/22/2023 1046   HGB 15.7 09/22/2023 1046   HGB 16.9 02/25/2023 1057   HCT 47.2 09/22/2023 1046   HCT 51.7 (H) 02/25/2023 1057   PLT 134 (L) 09/22/2023 1046   PLT 184 02/25/2023 1057   MCV 84.7 09/22/2023 1046   MCV 84 02/25/2023 1057    MCH 28.2 09/22/2023 1046   MCHC 33.3 09/22/2023 1046   RDW 13.5 09/22/2023 1046   RDW 13.9 02/25/2023 1057   LYMPHSABS 1.1 09/22/2023 1046   LYMPHSABS 1.9 12/18/2022 0909   MONOABS 0.7 09/22/2023 1046   EOSABS 0.5 09/22/2023 1046   EOSABS 0.2 12/18/2022 0909   BASOSABS 0.1 09/22/2023 1046   BASOSABS 0.0 12/18/2022 0909    CMP     Component Value Date/Time   NA 137 09/22/2023 1046   NA 144 09/14/2023 0000   K 4.0 09/22/2023 1046   CL 101 09/22/2023 1046   CO2 24 09/22/2023 1046   GLUCOSE 97 09/22/2023 1046   BUN 18 09/22/2023 1046   BUN 18 09/14/2023 0000   CREATININE 1.54 (H) 09/22/2023 1046   CREATININE 1.02 01/22/2017 1503   CALCIUM 9.0 09/22/2023 1046   PROT 6.6 09/22/2023 1046   PROT 6.3 11/01/2017 0939   ALBUMIN 3.8 09/22/2023 1046   ALBUMIN 4.1 11/01/2017 0939   AST 31 09/22/2023 1046   ALT 37 09/22/2023 1046   ALKPHOS 70 09/22/2023 1046   BILITOT 0.8 09/22/2023 1046   BILITOT 0.5 11/01/2017 0939   GFRNONAA 46 (L) 09/22/2023 1046   GFRAA 56 (L) 10/18/2020 1141    Assessment: 1.  GERD: Currently on Pantoprazole 40 mg daily with 1 to 2 days of breakthrough symptoms a week; likely gastritis 2.  History of gastric ulcers: Apparently years ago 3.  Recent cholecystectomy: Earlier this month  Plan: 1.  We will request patient's full GI records from [facility]. 2.  For now added Pepcid 20 mg nightly #30 with 11 refills.  Instructed the patient that he could use this nightly or he can use it as needed for breakthrough symptoms. 3.  Continue Pantoprazole 40 mg daily 4.  Discussed possible consequences from cholecystectomy including bile salt induced diarrhea and bile reflux, currently not having the symptoms. 5.  Patient to follow in clinic with [place] as needed.  He was assigned to Dr. Barron Alvine today per his request.  Hyacinth Meeker, PA-C [facility] 09/28/2023, 11:35 AM  Cc: Wanda Plump, MD   Addendum: 10/06/2023 10:34 AM  Received records  from [facility].  06/27/2018 GI consult for dysphagia.  At that time recommended an EGD and colonoscopy for screening.  06/14/2018 EGD with GE junction and Z-line at 40 cm and otherwise normal.  Biopsy showed fragment of unremarkable esophageal mucosa in the distal and proximal esophagus.  09/01/2021 patient presented for history of IDA.  At that time recommended repeat EGD and colonoscopy.  03/25/22 capsule endoscopy showed no lesions or bleeding.  I cannot see any report of patient's last colonoscopy it is just described as being "years ago".  We will see how the patient does with Pantoprazole 40 mg daily and he will see [place] as needed.  Hyacinth Meeker, PA-C

## 2023-09-28 NOTE — Patient Instructions (Signed)
We will request your records from [facility].   We have sent the following medications to your pharmacy for you to pick up at your convenience: Pepcid  _______________________________________________________  If your blood pressure at your visit was 140/90 or greater, please contact your primary care physician to follow up on this.  _______________________________________________________  If you are age 79 or older, your body mass index should be between 23-30. Your Body mass index is 30.28 kg/m. If this is out of the aforementioned range listed, please consider follow up with your Primary Care Provider.  If you are age 53 or younger, your body mass index should be between 19-25. Your Body mass index is 30.28 kg/m. If this is out of the aformentioned range listed, please consider follow up with your Primary Care Provider.   ________________________________________________________  The [facility] providers would like to encourage you to use [facility] to communicate with providers for non-urgent requests or questions.  Due to long hold times on the telephone, sending your provider a message by [facility] may be a faster and more efficient way to get a response.  Please allow 48 business hours for a response.  Please remember that this is for non-urgent requests.  _______________________________________________________  I appreciate the opportunity to care for you. Hyacinth Meeker PA-C

## 2023-09-30 DIAGNOSIS — R300 Dysuria: Secondary | ICD-10-CM | POA: Diagnosis not present

## 2023-09-30 DIAGNOSIS — R319 Hematuria, unspecified: Secondary | ICD-10-CM | POA: Diagnosis not present

## 2023-10-01 ENCOUNTER — Telehealth: Payer: Self-pay | Admitting: Urology

## 2023-10-01 NOTE — Telephone Encounter (Signed)
Patinet left voicemial saying he was going to need an [place] with his appt on Tuesday per what Dr said when he went to Urgent care.

## 2023-10-04 ENCOUNTER — Encounter: Payer: Self-pay | Admitting: Internal Medicine

## 2023-10-04 ENCOUNTER — Ambulatory Visit (INDEPENDENT_AMBULATORY_CARE_PROVIDER_SITE_OTHER): Payer: Medicare Other | Admitting: Internal Medicine

## 2023-10-04 VITALS — BP 126/80 | HR 76 | Temp 97.4°F | Resp 18 | Ht 70.0 in | Wt 212.2 lb

## 2023-10-04 DIAGNOSIS — E039 Hypothyroidism, unspecified: Secondary | ICD-10-CM | POA: Diagnosis not present

## 2023-10-04 DIAGNOSIS — R339 Retention of urine, unspecified: Secondary | ICD-10-CM | POA: Diagnosis not present

## 2023-10-04 NOTE — Progress Notes (Unsigned)
Subjective:    Patient ID: Martin Rubio, male    DOB: 1946/03/27, 77 y.o.   MRN: [id number]  DOS:  10/04/2023 Type of visit - description: Hospital ER follow-up  Admitted 09/16/2023 for acute gallstone pancreatitis.  Had surgery.  Was seen here for hospital follow-up 09/22/2023, was referred to the ER. Dx at the ER: - Bladder outlet obstruction, sent home with a Foley catheter. - After a Foley was placed, he was able to have a BM. Workup included: Creatinine 1.5, CBC okay, UA negative,   Saw urology 09/27/2023, had a voiding trial.  Went to urgent care 09/30/2023, urinalysis showed 1+ blood, 2+ protein.  No leukocyte.  Review of Systems He is here for follow-up. Overall feeling well. No fever or chills. Abdominal discomfort bloated resolved. Minimal dysuria sometimes.  No difficulty urinating. Last BM this morning, appetite okay.  Past Medical History:  Diagnosis Date   Bronchitis    Diverticulosis    FH: BPH (benign prostatic hypertrophy)    History of Graves' disease 01/19/2018   S/p ablation   HTN (hypertension)    Hyperlipidemia    Obesity    Psoriatic arthritis (HCC)    Rheumatoid arthritis (HCC) 08/07/2014   Sleep apnea    CPAP   Thyroid disease     Past Surgical History:  Procedure Laterality Date   ABDOMINAL AORTOGRAM W/LOWER EXTREMITY N/A 01/14/2022   Procedure: ABDOMINAL AORTOGRAM W/LOWER EXTREMITY;  Surgeon: Iran Ouch, MD;  [facility];  Service: Cardiovascular;  Laterality: N/A;   ATRIAL FIBRILLATION ABLATION N/A 12/24/2022   Procedure: ATRIAL FIBRILLATION ABLATION;  Surgeon: Maurice Small, MD;  Location: [facility];  Service: Cardiovascular;  Laterality: N/A;   CHOLECYSTECTOMY N/A 09/18/2023   Procedure: LAPAROSCOPIC CHOLECYSTECTOMY WITH INTRAOPERATIVE CHOLANGIOGRAM;  Surgeon: Kinsinger, De Blanch, MD;  Location: [facility] OR;  Service: General;  Laterality: N/A;   INGUINAL HERNIA REPAIR     bilateral   KNEE  SURGERY     bilateral arthroscopic   l foot surgery Left 12/2020   PACEMAKER IMPLANT N/A 12/13/2019   Procedure: PACEMAKER IMPLANT;  Surgeon: Marinus Maw, MD;  Location: [facility];  Service: Cardiovascular;  Laterality: N/A;   TRANSURETHRAL RESECTION OF PROSTATE     UMBILICAL HERNIA REPAIR      Current Outpatient Medications  Medication Instructions   amLODipine (NORVASC) 5 mg, Oral, Daily   cholecalciferol (VITAMIN D3) 2,000 Units, Oral, Daily   diclofenac Sodium (VOLTAREN) 4 g, Topical, 2 times daily   dofetilide (TIKOSYN) 250 MCG capsule TAKE ONE CAPSULE BY MOUTH TWICE A DAY   famotidine (PEPCID) 20 mg, Oral, Daily at bedtime   ferrous fumarate (FERRETTS) 325 (106 Fe) MG TABS tablet 106 mg of iron, Oral, 2 times daily   fluticasone (FLONASE) 50 MCG/ACT nasal spray 1 spray, Each Nare, As needed   hydrOXYzine (VISTARIL) 25 mg, Oral, Every 8 hours PRN   levothyroxine (SYNTHROID) 137 mcg, Oral, Daily before breakfast   losartan (COZAAR) 50 MG tablet TAKE ONE TABLET BY MOUTH EVERY MORNING AND TAKE ONE TABLET BY MOUTH AT BEDTIME   meclizine (ANTIVERT) 25 mg, 3 times daily PRN   melatonin 5 mg, Oral, Daily at bedtime   methocarbamol (ROBAXIN) 500 mg, Oral, Every 8 hours PRN   OVER THE COUNTER MEDICATION 2 capsules, Oral, Daily, Nervprin - Healthy Nerve Support - Peripheral Neuropathy Relief    oxybutynin (DITROPAN-XL) 10 mg, Oral, Daily at bedtime   pantoprazole (PROTONIX) 40 mg,  Oral, Daily   pregabalin (LYRICA) 50 mg, Oral, At bedtime PRN   Repatha SureClick 140 mg, Subcutaneous, Every 14 days   rivaroxaban (XARELTO) 20 mg, Oral, Daily with supper, Dose change, please call [phone]   tadalafil (CIALIS) 20 mg, Oral, Daily PRN   tamsulosin (FLOMAX) 0.4 mg, Oral, Daily at bedtime   triamcinolone cream (KENALOG) 0.1 % 1 application , Topical, Daily PRN   Vitamin B 12 1,000 mcg, Oral, Daily       Objective:   Physical Exam BP 126/80   Pulse 76   Temp (!) 97.4 F  (36.3 C) (Oral)   Resp 18   Ht 5\' 10"  (1.778 m)   Wt 212 lb 4 oz (96.3 kg)   SpO2 96%   BMI 30.45 kg/m  General:   Well developed, NAD, BMI noted.  HEENT:  Normocephalic . Face symmetric, atraumatic Lungs:  CTA B Normal respiratory effort, no intercostal retractions, no accessory muscle use. Heart: RRR,  no murmur.  Abdomen:  Not distended, soft, non-tender. No rebound or rigidity.   Skin: Not pale. Not jaundice Lower extremities: no pretibial edema bilaterally  Neurologic:  alert & oriented X3.  Speech normal, gait appropriate for age and unassisted Psych--  Cognition and judgment appear intact.  Cooperative with normal attention span and concentration.  Behavior appropriate. No anxious or depressed appearing.     Assessment   ASSESSMENT  (transfer to me 12/2019) Hyperglycemia HTN High cholesterol, seen at the lipid clinic Hypothyroidism  Psoriatic Arthritis Martin Rubio  Osteoporosis: on fosamax rx by PCP, start holiday by mid 2022 (see visit note from 02/29/2020) CKD mild,stable . Creatinine ~1.5 CV: -Paroxysmal A. Fib, dx 09/2019 anticoagulated.  Ablation 12/24/2022. -Sick sinus syndrome, s/p pacemaker (12/13/2019) -Septal hypertrophy per echo -Left common iliac aneurysm, AAA  - PVD OSA on CPAP BPH - sees urology HOH  Iron deficiency anemia: 12/02/2021 >> EGD (several polyps suggestive of FGP), C-scope:no polyps, no cancer. Pathology: benign H/o ocular migraines , dx by opht  per pt   PLAN Acute gallstone pancreatitis: Had surgery 09/18/2023, seems to be doing well. Urinary retention: Went to the ER, had a Foley placed, had a successful void trial.  Currently essentially with no symptoms.  Will see urology tomorrow. Fecal impaction, resolved after the ER visit few days ago.  Currently having more regular bowel movements, still not back to his normal pattern, on MiraLAX. CKD: Mild, last BMP stable. Hypothyroidism: Recommend a TSH, declined need to get blood  today. Preventive care: Strongly declines COVID flu shot.  Considering RSV. RTC 6 months, declined sooner follow-up.      Acute gallstone pancreatitis. Here for follow-up, surgery was 09/18/2023. Discharged on essentially the same medications. Presents today for follow-up, having abdominal pain, has not have a bowel movement since before the surgery. Today he has been unable to urinate. On DRE there is abundant - although no hard-  stools. Impression: Urinary retention, fecal impaction and possibly early SBO although the abdomen is not very distended.   (Noting last oxycodone intake was more than 30 hours ago). Discussed situation with the ER physician who agreed to see him and evaluate.  Appreciate his help.

## 2023-10-04 NOTE — Patient Instructions (Signed)
  Check the  blood pressure regularly Blood pressure goal:  between 110/65 and  135/85. If it is consistently higher or lower, let me know        Next visit with me in 6 months.  Routine checkup.  Please schedule it at the front desk

## 2023-10-05 ENCOUNTER — Ambulatory Visit (INDEPENDENT_AMBULATORY_CARE_PROVIDER_SITE_OTHER): Payer: Medicare Other | Admitting: Urology

## 2023-10-05 ENCOUNTER — Encounter: Payer: Self-pay | Admitting: Urology

## 2023-10-05 VITALS — BP 153/85 | HR 60 | Ht 70.0 in | Wt 206.0 lb

## 2023-10-05 DIAGNOSIS — N138 Other obstructive and reflux uropathy: Secondary | ICD-10-CM | POA: Diagnosis not present

## 2023-10-05 DIAGNOSIS — R339 Retention of urine, unspecified: Secondary | ICD-10-CM | POA: Diagnosis not present

## 2023-10-05 DIAGNOSIS — Z87898 Personal history of other specified conditions: Secondary | ICD-10-CM | POA: Diagnosis not present

## 2023-10-05 DIAGNOSIS — N401 Enlarged prostate with lower urinary tract symptoms: Secondary | ICD-10-CM | POA: Diagnosis not present

## 2023-10-05 DIAGNOSIS — N529 Male erectile dysfunction, unspecified: Secondary | ICD-10-CM | POA: Diagnosis not present

## 2023-10-05 LAB — BLADDER SCAN AMB NON-IMAGING

## 2023-10-05 LAB — MICROSCOPIC EXAMINATION

## 2023-10-05 LAB — URINALYSIS, ROUTINE W REFLEX MICROSCOPIC
Bilirubin, UA: NEGATIVE
Glucose, UA: NEGATIVE
Ketones, UA: NEGATIVE
Leukocytes,UA: NEGATIVE
Nitrite, UA: NEGATIVE
Specific Gravity, UA: 1.015 (ref 1.005–1.030)
Urobilinogen, Ur: 0.2 mg/dL (ref 0.2–1.0)
pH, UA: 5.5 (ref 5.0–7.5)

## 2023-10-05 NOTE — Assessment & Plan Note (Signed)
Acute gallstone pancreatitis: Had surgery 09/18/2023, seems to be doing well. Urinary retention: Went to the ER, had a Foley placed, had a successful void trial.  Currently essentially with no symptoms.  Will see urology tomorrow. Fecal impaction, resolved after the ER visit few days ago.  Currently having more regular bowel movements, still not back to his normal pattern, on MiraLAX. CKD: Mild, last BMP stable. Hypothyroidism: Recommend a TSH, declined need to get blood today. Preventive care: Strongly declines COVID flu shot.  Considering RSV. RTC 6 months, declined sooner follow-up.

## 2023-10-05 NOTE — Progress Notes (Signed)
Assessment: 1. BPH with obstruction/lower urinary tract symptoms   2. History of urinary retention   3. Organic impotence     Plan: I personally reviewed the patient's chart including provider notes, and lab results. I reviewed the patient records from [facility]. Continue tamsulosin 0.4 mg daily. Discontinue oxybutynin XL at this time due to possible side effects of urinary retention. Return to office in 1 month.  Chief Complaint:  Chief Complaint  Patient presents with   Benign Prostatic Hypertrophy    History of Present Illness:  Martin Rubio is a 77 y.o. male who is seen for evaluation of BPH with obstruction and urinary retention. He was previously followed at [facility] urology in [facility].  His last visit with me was in January 2022.  Urologic History: History of BPH with obstruction.  He is status post a greenlight PVP on 06/01/2013.  His urinary symptoms improved significantly following the procedure.  Cystoscopy in September 2014 showed a widely patent prostatic urethra.  At his visit in 1/22, he noted some slight increase in his urinary symptoms with decreased dream, urgency, and nocturia.  He is not having any dysuria or gross hematuria. IPSS = 14.  PSA results: 11/18 1.37 11/19 1.80 12/20 0.93 1/22 1.48  He has history of erectile dysfunction.  He has previously used tadalafil 5 mg daily as well as generic sildenafil.  He reported some flushing with 60 mg of sildenafil.  He has most recently been using tadalafil 20 mg as needed.  He has been managed with tamsulosin with fairly good control of his urinary symptoms.  He was given a trial of Myrbetriq in April 2023.  He was changed to oxybutynin XL 10 mg daily due to cost of Myrbetriq.  He was referred to nephrology for chronic kidney disease.  CT imaging from 2023 showed mild renal atrophy without hydronephrosis.  Renal ultrasound in July 2023 showed bilateral renal calculi.  KUB from 7/23 showed  no obvious stones.  He recently had an episode of urinary retention following laparoscopic cholecystectomy on 09/18/2023.  A Foley catheter was left in place and he was taking tamsulosin.  His catheter was removed after a voiding trial on 09/27/2023. He was seen in urgent care on 09/30/2023 with dysuria.  Urinalysis was unremarkable. He has been voiding spontaneously since the catheter was removed.  He reports that the dysuria is improving.  He does have urgency and some hesitancy.  No gross hematuria or flank pain.  He continues on tamsulosin and oxybutynin XL. IPSS = 13 today.  Past Medical History:  Past Medical History:  Diagnosis Date   Bronchitis    Diverticulosis    FH: BPH (benign prostatic hypertrophy)    History of Graves' disease 01/19/2018   S/p ablation   HTN (hypertension)    Hyperlipidemia    Obesity    Psoriatic arthritis (HCC)    Rheumatoid arthritis (HCC) 08/07/2014   Sleep apnea    CPAP   Thyroid disease     Past Surgical History:  Past Surgical History:  Procedure Laterality Date   ABDOMINAL AORTOGRAM W/LOWER EXTREMITY N/A 01/14/2022   Procedure: ABDOMINAL AORTOGRAM W/LOWER EXTREMITY;  Surgeon: Iran Ouch, MD;  Location: [facility] INVASIVE [facility];  Service: Cardiovascular;  Laterality: N/A;   ATRIAL FIBRILLATION ABLATION N/A 12/24/2022   Procedure: ATRIAL FIBRILLATION ABLATION;  Surgeon: Maurice Small, MD;  Location: [facility] INVAS[facility] CV [facility];  Service: Cardiovascular;  Laterality: N/A;   CHOLECYSTECTOMY N/A 09/18/2023  Procedure: LAPAROSCOPIC CHOLECYSTECTOMY WITH INTRAOPERATIVE CHOLANGIOGRAM;  Surgeon: Kinsinger, De Blanch, MD;  Location: [facility] OR;  Service: General;  Laterality: N/A;   INGUINAL HERNIA REPAIR     bilateral   KNEE SURGERY     bilateral arthroscopic   l foot surgery Left 12/2020   PACEMAKER IMPLANT N/A 12/13/2019   Procedure: PACEMAKER IMPLANT;  Surgeon: Marinus Maw, MD;  Location: [facility]VASIVE CV [facility];  Service: Cardiovascular;  Laterality:  N/A;   TRANSURETHRAL RESECTION OF PROSTATE     UMBILICAL HERNIA REPAIR      Allergies:  Allergies  Allergen Reactions   Codeine     Tension, nasty feeling    Nsaids Other (See Comments)    Renal insufficiency   Prednisone Other (See Comments)    Unknown/ sometimes takes with lower dose   Statins Other (See Comments)    Joint pain    Sulfa Antibiotics Other (See Comments)    Joint pain    Family History:  Family History  Problem Relation Age of Onset   CAD Mother 109       CABG   CAD Father 51       Died age 29   Diabetes Brother    Lung cancer Maternal Grandmother    Prostate cancer Neg Hx    Colon cancer Neg Hx     Social History:  Social History   Tobacco Use   Smoking status: Former    Types: Cigarettes   Smokeless tobacco: Never   Tobacco comments:    Former smoker Quit 46 years ago. 01/21/23  Vaping Use   Vaping status: Never Used  Substance Use Topics   Alcohol use: No   Drug use: No    Review of symptoms:  Constitutional:  Negative for unexplained weight loss, night sweats, fever, chills ENT:  Negative for nose bleeds, sinus pain, painful swallowing CV:  Negative for chest pain, shortness of breath, exercise intolerance, palpitations, loss of consciousness Resp:  Negative for cough, wheezing, shortness of breath GI:  Negative for nausea, vomiting, diarrhea, bloody stools GU:  Positives noted in HPI; otherwise negative for gross hematuria, dysuria, urinary incontinence Neuro:  Negative for seizures, poor balance, limb weakness, slurred speech Psych:  Negative for lack of energy, depression, anxiety Endocrine:  Negative for polydipsia, polyuria, symptoms of hypoglycemia (dizziness, hunger, sweating) Hematologic:  Negative for anemia, purpura, petechia, prolonged or excessive bleeding, use of anticoagulants  Allergic:  Negative for difficulty breathing or choking as a result of exposure to anything; no shellfish allergy; no allergic response  (rash/itch) to materials, foods  Physical exam: BP (!) 153/85   Pulse 60   Ht 5\' 10"  (1.778 m)   Wt 206 lb (93.4 kg)   BMI 29.56 kg/m  GENERAL APPEARANCE:  Well appearing, well developed, well nourished, NAD HEENT: Atraumatic, Normocephalic, oropharynx clear. NECK: Supple without lymphadenopathy or thyromegaly. LUNGS: Clear to auscultation bilaterally. HEART: Regular Rate and Rhythm without murmurs, gallops, or rubs. ABDOMEN: Soft, non-tender, No Masses. EXTREMITIES: Moves all extremities well.  Without clubbing, cyanosis, or edema. NEUROLOGIC:  Alert and oriented x 3, normal gait, CN II-XII grossly intact.  MENTAL STATUS:  Appropriate. BACK:  Non-tender to palpation.  No CVAT SKIN:  Warm, dry and intact.   GU: Penis:  circumcised Meatus: Normal Scrotum: normal, no masses Testis: normal without masses bilateral Prostate: 50 g, NT, no nodules Rectum: Normal tone,  no masses or tenderness   Results: U/A: 0-5 WBC, 0-2 RBC  PVR: 90 ml

## 2023-10-11 ENCOUNTER — Other Ambulatory Visit: Payer: Self-pay

## 2023-10-11 MED ORDER — DOFETILIDE 250 MCG PO CAPS: 250.0000 ug | ORAL_CAPSULE | Freq: Two times a day (BID) | ORAL | 1 refills | Status: DC

## 2023-10-15 ENCOUNTER — Ambulatory Visit: Payer: Medicare Other | Admitting: Vascular Surgery

## 2023-10-15 ENCOUNTER — Encounter (HOSPITAL_COMMUNITY): Payer: Medicare Other

## 2023-10-17 NOTE — Progress Notes (Unsigned)
Electrophysiology Office Note:   Date:  10/18/2023  ID:  Martin Rubio, DOB 07/08/1946, MRN [id number]  Primary Cardiologist: Rollene Rotunda, MD Electrophysiologist: Maurice Small, MD      History of Present Illness:   Martin Rubio is a 77 y.o. male with h/o paroxysmal AF, SSS s/p PPM, HTN, Hypertrophic Cardiomyopathy, AV sclerosis seen today for routine electrophysiology followup.   Recent admit for 09/2023 for acute gallstone pancreatitis.  Then returned to ER for bladder outlet obstruction s/p foley. Reports foley has since been removed and he feels much better.   Since last being seen in our clinic the patient reports feeling tired at times but thought it was post anesthesia. No awareness of atrial fibrillation. He checks with [facility] and has not had any AF.    He denies chest pain, palpitations, dyspnea, PND, orthopnea, nausea, vomiting, dizziness, syncope, edema, weight gain, or early satiety.   Review of systems complete and found to be negative unless listed in HPI.    EP Information / Studies Reviewed:    EKG is ordered today. Personal review as below.  EKG Interpretation Date/Time:  Monday October 18 2023 11:48:57 EST Ventricular Rate:  60 PR Interval:    QRS Duration:  94 QT Interval:  436 QTC Calculation: 436 R Axis:   -36  Text Interpretation: ATRIAL PACED RHYTHM Left axis deviation Incomplete right bundle branch block Confirmed by Canary Brim ([id number]) on 10/18/2023 12:04:48 PM   PPM Interrogation-  reviewed in detail today,  See PACEART report.  Device History: Medtronic Dual Chamber PPM implanted 12/13/2019 for Sinus Node Dysfunction  Studies:  Nuc Stress 05/2020 > LVEF 55-65%, normal / low risk study   Arrhythmia / AAD Paroxysmal AF, dx 09/2009, s/p Ablation 12/24/22  SSS s/p PPM 12/2019   Risk Assessment/Calculations:    CHA2DS2-VASc Score = 3   This indicates a 3.2% annual risk of stroke. The patient's score is based upon: CHF  History: 0 HTN History: 1 Diabetes History: 0 Stroke History: 0 Vascular Disease History: 0 Age Score: 2 Gender Score: 0             Physical Exam:   VS:  BP 130/78   Pulse 60   Ht 5\' 10"  (1.778 m)   Wt 212 lb (96.2 kg)   SpO2 94%   BMI 30.42 kg/m    Wt Readings from Last 3 Encounters:  10/18/23 212 lb (96.2 kg)  10/05/23 206 lb (93.4 kg)  10/04/23 212 lb 4 oz (96.3 kg)     GEN: Well nourished, well developed in no acute distress NECK: No JVD; No carotid bruits CARDIAC: Regular rate and rhythm, no murmurs, rubs, gallops. PPM site well healed, no tethering, edema or erythema.  RESPIRATORY:  Clear to auscultation without rales, wheezing or rhonchi  ABDOMEN: Soft, non-tender, non-distended EXTREMITIES:  No edema; No deformity   ASSESSMENT AND PLAN:    SND, Mobitz II AVB s/p Medtronic PPM  -Normal PPM function -See Pace Art report -rate response turned on for fatigue with exertion and noted ventricular binning in the 60's  Paroxysmal Atrial Fibrillation  S/p ablatoin 12/2022 -continue Tikosyn 250 mcg every day -EKG with stable intervals  -<0.1% burden on device  -BMP, Mg+ in 09/2023 with K+ 4.0, Mg+ 2.1  -OAC as below   Secondary Hypercoagulable State  -continue xarelto 20mg  every day, dose reviewed Cr Cl 52 mL/min   Disposition:   Follow up with Dr. Nelly Laurence in 6 months  Signed, Merry Proud  Veleta Miners, MSN, APRN, NP-C, AGACNP-BC [facility] - Electrophysiology  10/18/2023, 1:36 PM

## 2023-10-18 ENCOUNTER — Ambulatory Visit: Payer: Medicare Other | Attending: Cardiovascular Disease | Admitting: Pulmonary Disease

## 2023-10-18 ENCOUNTER — Encounter: Payer: Self-pay | Admitting: Pulmonary Disease

## 2023-10-18 VITALS — BP 130/78 | HR 60 | Ht 70.0 in | Wt 212.0 lb

## 2023-10-18 DIAGNOSIS — I480 Paroxysmal atrial fibrillation: Secondary | ICD-10-CM | POA: Insufficient documentation

## 2023-10-18 DIAGNOSIS — D6869 Other thrombophilia: Secondary | ICD-10-CM | POA: Diagnosis not present

## 2023-10-18 DIAGNOSIS — Z950 Presence of cardiac pacemaker: Secondary | ICD-10-CM | POA: Diagnosis not present

## 2023-10-18 DIAGNOSIS — I442 Atrioventricular block, complete: Secondary | ICD-10-CM | POA: Diagnosis not present

## 2023-10-18 LAB — CUP PACEART INCLINIC DEVICE CHECK
Battery Remaining Longevity: 115 mo
Battery Voltage: 3 V
Brady Statistic AP VP Percent: 11.43 %
Brady Statistic AP VS Percent: 63.72 %
Brady Statistic AS VP Percent: 0.2 %
Brady Statistic AS VS Percent: 24.65 %
Brady Statistic RA Percent Paced: 74.95 %
Brady Statistic RV Percent Paced: 11.63 %
Date Time Interrogation Session: 20241111135326
Implantable Lead Connection Status: 753985
Implantable Lead Connection Status: 753985
Implantable Lead Implant Date: 20210106
Implantable Lead Implant Date: 20210106
Implantable Lead Location: 753859
Implantable Lead Location: 753860
Implantable Lead Model: 3830
Implantable Lead Model: 5076
Implantable Pulse Generator Implant Date: 20210106
Lead Channel Impedance Value: 323 Ohm
Lead Channel Impedance Value: 361 Ohm
Lead Channel Impedance Value: 399 Ohm
Lead Channel Impedance Value: 532 Ohm
Lead Channel Pacing Threshold Amplitude: 0.625 V
Lead Channel Pacing Threshold Amplitude: 0.625 V
Lead Channel Pacing Threshold Pulse Width: 0.4 ms
Lead Channel Pacing Threshold Pulse Width: 0.4 ms
Lead Channel Sensing Intrinsic Amplitude: 1.75 mV
Lead Channel Sensing Intrinsic Amplitude: 1.875 mV
Lead Channel Sensing Intrinsic Amplitude: 25.625 mV
Lead Channel Sensing Intrinsic Amplitude: 27.375 mV
Lead Channel Setting Pacing Amplitude: 1.5 V
Lead Channel Setting Pacing Amplitude: 2.5 V
Lead Channel Setting Pacing Pulse Width: 0.4 ms
Lead Channel Setting Sensing Sensitivity: 2 mV
Zone Setting Status: 755011
Zone Setting Status: 755011

## 2023-10-18 NOTE — Patient Instructions (Signed)
Medication Instructions:  Your physician recommends that you continue on your current medications as directed. Please refer to the Current Medication list given to you today.  *If you need a refill on your cardiac medications before your next appointment, please call your pharmacy*  Lab Work: None ordered If you have labs (blood work) drawn today and your tests are completely normal, you will receive your results only by: MyChart Message (if you have MyChart) OR A paper copy in the mail If you have any lab test that is abnormal or we need to change your treatment, we will call you to review the results.  Follow-Up: At [facility], you and your health needs are our priority.  As part of our continuing mission to provide you with exceptional heart care, we have created designated Provider Care Teams.  These Care Teams include your primary Cardiologist (physician) and Advanced Practice Providers (APPs -  Physician Assistants and Nurse Practitioners) who all work together to provide you with the care you need, when you need it.  Your next appointment:   6 month(s)  Provider:   Augustus Mealor, MD  

## 2023-10-19 NOTE — Progress Notes (Unsigned)
Office Note    HPI: Martin Rubio is a 77 yRubioo. ([birth year]) male presenting in follow-up with known, small AAA, bilateral iliac artery stenosis, left common iliac artery aneurysm.  On exam, Martin Rubio was doing well.  A [place] native, he moved down to [place] years ago.  He is an Art gallery manager by trade.   Since last seen on year ago, Martin Rubio has had no major health changes.  He denies symptoms of claudication, ischemic rest pain, tissue loss.  Denies abdominal pain, chest pain.  Has baseline back pain, which is no worse.  He has had a low activity level, but is working to start an exercise regimen. He noted that he is obese, and needs to lose weight.  Martin Rubio Kitchen  He is very involved in [place], and has a strong faith.  He is part of a prayer group at the state level, which has [place] send it to her support. He cannot take statins due to myalgia, and has a history of GI ulcer and does not take ASA.  The pt is not on a statin for cholesterol management.  The pt is not on a daily aspirin.   Other AC:  Xarelto The pt is  on medication for hypertension.   The pt is not diabetic.  Tobacco hx:  -  Past Medical History:  Diagnosis Date   Bronchitis    Diverticulosis    FH: BPH (benign prostatic hypertrophy)    History of Graves' disease 01/19/2018   S/p ablation   HTN (hypertension)    Hyperlipidemia    Obesity    Psoriatic arthritis (HCC)    Rheumatoid arthritis (HCC) 08/07/2014   Sleep apnea    CPAP   Thyroid disease     Past Surgical History:  Procedure Laterality Date   ABDOMINAL AORTOGRAM W/LOWER EXTREMITY N/A 01/14/2022   Procedure: ABDOMINAL AORTOGRAM W/LOWER EXTREMITY;  Surgeon: Iran Ouch, MD;  Location: [facility];  Service: Cardiovascular;  Laterality: N/A;   ATRIAL FIBRILLATION ABLATION N/A 12/24/2022   Procedure: ATRIAL FIBRILLATION ABLATION;  Surgeon: Maurice Small, MD;  Location: [facility];  Service: Cardiovascular;  Laterality: N/A;    CHOLECYSTECTOMY N/A 09/18/2023   Procedure: LAPAROSCOPIC CHOLECYSTECTOMY WITH INTRAOPERATIVE CHOLANGIOGRAM;  Surgeon: Kinsinger, De Blanch, MD;  Location: [facility];  Service: General;  Laterality: N/A;   INGUINAL HERNIA REPAIR     bilateral   KNEE SURGERY     bilateral arthroscopic   l foot surgery Left 12/2020   PACEMAKER IMPLANT N/A 12/13/2019   Procedure: PACEMAKER IMPLANT;  Surgeon: Marinus Maw, MD;  Location: [facility];  Service: Cardiovascular;  Laterality: N/A;   TRANSURETHRAL RESECTION OF PROSTATE     UMBILICAL HERNIA REPAIR      Social History   Socioeconomic History   Marital status: Married    Spouse name: Not on file   Number of children: 2   Years of education: Not on file   Highest education level: Associate degree: occupational, Scientist, product/process development, or vocational program  Occupational History   Occupation: Retired    Associate Professor: [employer]  Tobacco Use   Smoking status: Former    Types: Cigarettes   Smokeless tobacco: Never   Tobacco comments:    Former smoker Quit 46 years ago. 01/21/23  Vaping Use   Vaping status: Never Used  Substance and Sexual Activity   Alcohol use: No   Drug use: No   Sexual activity: Not on file  Other Topics  Concern   Not on file  Social History Narrative   Lives with wife.   Social Determinants of Health   Financial Resource Strain: Low Risk  (10/01/2023)   Overall Financial Resource Strain (CARDIA)    Difficulty of Paying Living Expenses: Not hard at all  Food Insecurity: No Food Insecurity (10/01/2023)   Hunger Vital Sign    Worried About Running Out of Food in the Last Year: Never true    Ran Out of Food in the Last Year: Never true  Transportation Needs: No Transportation Needs (10/01/2023)   PRAPARE - Administrator, Civil Service (Medical): No    Lack of Transportation (Non-Medical): No  Physical Activity: Insufficiently Active (10/01/2023)   Exercise Vital Sign    Days of Exercise per Week: 1 day     Minutes of Exercise per Session: 10 min  Stress: No Stress Concern Present (10/01/2023)   [employer] of Occupational Health - Occupational Stress Questionnaire    Feeling of Stress : Not at all  Social Connections: Unknown (10/01/2023)   Social Connection and Isolation Panel [NHANES]    Frequency of Communication with Friends and Family: More than three times a week    Frequency of Social Gatherings with Friends and Family: Once a week    Attends Religious Services: More than 4 times per year    Active Member of [employer] or Organizations: Patient declined    Attends Banker Meetings: Not on file    Marital Status: Married  Intimate Partner Violence: Not At Risk (09/17/2023)   Humiliation, Afraid, Rape, and Kick questionnaire    Fear of Current or Ex-Partner: No    Emotionally Abused: No    Physically Abused: No    Sexually Abused: No   Family History  Problem Relation Age of Onset   CAD Mother 80       CABG   CAD Father 34       Died age 56   Diabetes Brother    Lung cancer Maternal Grandmother    Prostate cancer Neg Hx    Colon cancer Neg Hx     Current Outpatient Medications  Medication Sig Dispense Refill   amLODipine (NORVASC) 5 MG tablet Take 1 tablet (5 mg total) by mouth daily. 90 tablet 3   cholecalciferol (VITAMIN D3) 25 MCG (1000 UNIT) tablet Take 2,000 Units by mouth daily in the afternoon.     Cyanocobalamin (VITAMIN B 12) 500 MCG TABS Take 1,000 mcg by mouth daily in the afternoon.     diclofenac Sodium (VOLTAREN) 1 % GEL Apply 4 g topically in the morning and at bedtime. 100 g 6   dofetilide (TIKOSYN) 250 MCG capsule Take 1 capsule (250 mcg total) by mouth 2 (two) times daily. 180 capsule 1   Evolocumab (REPATHA SURECLICK) 140 MG/ML SOAJ Inject 140 mg into the skin every 14 (fourteen) days. 2 mL 11   famotidine (PEPCID) 20 MG tablet Take 1 tablet (20 mg total) by mouth at bedtime. 30 tablet 11   ferrous fumarate (FERRETTS) 325 (106 Fe) MG TABS  tablet Take 1 tablet (106 mg of iron total) by mouth 2 (two) times daily. (Patient taking differently: Take 1 tablet by mouth daily.) 180 tablet 1   fluticasone (FLONASE) 50 MCG/ACT nasal spray Place 1 spray into both nostrils as needed for allergies.     hydrOXYzine (VISTARIL) 25 MG capsule Take 1 capsule (25 mg total) by mouth every 8 (eight) hours as needed. 60  capsule 3   levothyroxine (SYNTHROID) 137 MCG tablet Take 1 tablet (137 mcg total) by mouth daily before breakfast. 90 tablet 1   losartan (COZAAR) 50 MG tablet TAKE ONE TABLET BY MOUTH EVERY MORNING AND TAKE ONE TABLET BY MOUTH AT BEDTIME 180 tablet 2   meclizine (ANTIVERT) 25 MG tablet Take 25 mg by mouth 3 (three) times daily as needed for dizziness.     Melatonin 5 MG TABS Take 5 mg by mouth at bedtime.     methocarbamol (ROBAXIN) 500 MG tablet Take 1 tablet (500 mg total) by mouth every 8 (eight) hours as needed for muscle spasms. 30 tablet 1   OVER THE COUNTER MEDICATION Take 2 capsules by mouth daily in the afternoon. Nervprin - Healthy Nerve Support - Peripheral Neuropathy Relief     pantoprazole (PROTONIX) 40 MG tablet Take 1 tablet (40 mg total) by mouth daily. 90 tablet 1   pregabalin (LYRICA) 50 MG capsule Take 50 mg by mouth at bedtime as needed.     rivaroxaban (XARELTO) 20 MG TABS tablet Take 1 tablet (20 mg total) by mouth daily with supper. Dose change, please call [phone] 90 tablet 1   tadalafil (CIALIS) 20 MG tablet Take 20 mg by mouth daily as needed for erectile dysfunction.     tamsulosin (FLOMAX) 0Rubio4 MG CAPS capsule Take 0Rubio4 mg by mouth at bedtime.     triamcinolone cream (KENALOG) 0Rubio1 % Apply 1 application  topically daily as needed for itching.     No current facility-administered medications for this visit.    Allergies  Allergen Reactions   Codeine     Tension, nasty feeling    Nsaids Other (See Comments)    Renal insufficiency   Prednisone Other (See Comments)    Unknown/ sometimes takes with  lower dose   Statins Other (See Comments)    Joint pain    Sulfa Antibiotics Other (See Comments)    Joint pain     REVIEW OF SYSTEMS:   [X]  denotes positive finding, [ ]  denotes negative finding Cardiac  Comments:  Chest pain or chest pressure:    Shortness of breath upon exertion:    Short of breath when lying flat:    Irregular heart rhythm:        Vascular    Pain in calf, thigh, or hip brought on by ambulation:    Pain in feet at night that wakes you up from your sleep:     Blood clot in your veins:    Leg swelling:         Pulmonary    Oxygen at home:    Productive cough:     Wheezing:         Neurologic    Sudden weakness in arms or legs:     Sudden numbness in arms or legs:     Sudden onset of difficulty speaking or slurred speech:    Temporary loss of vision in one eye:     Problems with dizziness:         Gastrointestinal    Blood in stool:     Vomited blood:         Genitourinary    Burning when urinating:     Blood in urine:        Psychiatric    Major depression:         Hematologic    Bleeding problems:    Problems with blood clotting too easily:  Skin    Rashes or ulcers:        Constitutional    Fever or chills:      PHYSICAL EXAMINATION:  There were no vitals filed for this visit.  General:  WDWN in NAD; vital signs documented above Gait: Not observed HENT: WNL, normocephalic Pulmonary: normal non-labored breathing , without wheezing Cardiac: regular HR,  Abdomen: soft, NT, no masses Skin: without rashes Vascular Exam/Pulses:  Right Left  Radial 2+ (normal) 2+ (normal)  Ulnar 2+ (normal) 2+ (normal)  Femoral 2+ (normal) 1+ (weak)  Popliteal    DP 2+ (normal) 2+ (normal)  PT     Extremities: without ischemic changes, without Gangrene , without cellulitis; without open wounds;  Musculoskeletal: no muscle wasting or atrophy  Neurologic: A&O X 3;  No focal weakness or paresthesias are detected Psychiatric:  The pt has  Normal affect.   Non-Invasive Vascular Imaging:      Abdominal Aorta Findings:  +------------+-------+---------+---------+---------+--------+--------------  ----+  Location   AP (cm)Trans    PSV      Waveform ThrombusComments                                (cm)     (cm/s)                                         +------------+-------+---------+---------+---------+--------+--------------  ----+  Proximal                   57       biphasic                              +------------+-------+---------+---------+---------+--------+--------------  ----+  Distal                     76       biphasic         3Rubio0 cm.              +------------+-------+---------+---------+---------+--------+--------------  ----+  RT CIA Prox                 323      biphasic         1Rubio88 x 1Rubio91          +------------+-------+---------+---------+---------+--------+--------------  ----+  RT CIA Mid                  147      biphasic                              +------------+-------+---------+---------+---------+--------+--------------  ----+  RT CIA                      130      biphasic                              Distal                                                                     +------------+-------+---------+---------+---------+--------+--------------  ----+  RT EIA Prox                 96       biphasic                              +------------+-------+---------+---------+---------+--------+--------------  ----+  RT EIA Mid                  71       biphasic                              +------------+-------+---------+---------+---------+--------+--------------  ----+  RT EIA                      78       triphasic                             Distal                                                                      +------------+-------+---------+---------+---------+--------+--------------  ----+  LT CIA Prox                 479      biphasic         2Rubio72 x 2Rubio51                                                                ANEURYSM             +------------+-------+---------+---------+---------+--------+--------------  ----+  LT CIA Mid                  117      biphasic         brisk 1Rubio656 x  1Rubio47  +------------+-------+---------+---------+---------+--------+--------------  ----+  LT CIA                      70       biphasic                              Distal                                                                     +------------+-------+---------+---------+---------+--------+--------------  ----+  LT EIA Prox                 73       triphasic                             +------------+-------+---------+---------+---------+--------+--------------  ----+  LT EIA Mid                  76       biphasic                              +------------+-------+---------+---------+---------+--------+--------------  ----+  LT EIA                      89       triphasic                             Distal                                                                     +------------+-------+---------+---------+---------+--------+--------------  ----+         Summary:  Abdominal Aorta: There is evidence of abnormal dilatation of the distal  Abdominal aorta measuring 3Rubio0 cm.  Stenosis: +------------------+-------------+  Location          Stenosis       +------------------+-------------+  Right Common Iliac>50% stenosis  +------------------+-------------+  Left Common Iliac >50% stenosis  +------------------+-------------+      Note: Unable to thoroughly evaluate aneurysms of the bilateral ilicac  arteries due to patients inability to tolerate exam.      ABI Findings:   +---------+------------------+-----+---------+--------+  Right   Rt Pressure (mmHg)IndexWaveform Comment   +---------+------------------+-----+---------+--------+  Brachial 151                                       +---------+------------------+-----+---------+--------+  PTA     161               1Rubio07 triphasic          +---------+------------------+-----+---------+--------+  DP      124               0Rubio82 triphasic          +---------+------------------+-----+---------+--------+  Kerby Nora               0Rubio77 Normal             +---------+------------------+-----+---------+--------+   +---------+------------------+-----+---------+-------+  Left    Lt Pressure (mmHg)IndexWaveform Comment  +---------+------------------+-----+---------+-------+  Brachial 144                                      +---------+------------------+-----+---------+-------+  PTA     150               0Rubio99 triphasic         +---------+------------------+-----+---------+-------+  DP      117               0Rubio77 triphasic         +---------+------------------+-----+---------+-------+  Great Toe109               0Rubio72 Normal            +---------+------------------+-----+---------+-------+   +-------+-----------+-----------+------------+------------+  ABI/TBIToday's ABIToday's TBIPrevious  ABIPrevious TBI  +-------+-----------+-----------+------------+------------+  Right 1Rubio07       0Rubio77       1Rubio07        0Rubio71          +-------+-----------+-----------+------------+------------+  Left  0Rubio99       0Rubio72       0Rubio88        0Rubio70          +-------+-----------+-----------+------------+------------+   ASSESSMENT/PLAN: LATANYA LAVENTURE is a 77 yRubioo. male presenting with mixed aneurysmal occlusive disease.  Claudication symptoms have improved, however they have is not ambulating as much as he used to. He has a small 3Rubio1 cm infrarenal abdominal  aneurysm.  Significant stenosis of the left common iliac artery with poststenotic aneurysm measuring 2Rubio7 cm.  There is ectasia at the right external iliac artery as well measuring 1Rubio9 cm.  Sizes of the infrarenal abdominal aorta and common iliac arteries were difficult to obtain due to the patient's body habitus.  In evaluating these numbers versus the CT scan taken 1 year ago, there is interval growth in both.  There is new ectasia of the right common iliac artery.  The left common iliac artery is similar in size and long axis.  No significant growth in the AAA.  Regardless, interval growth has not exceeded 1 cm, therefore there is no indication for repair at this time.  We discussed that his infrarenal abdominal aneurysm would be repaired electively at a size of 5Rubio5 cm and the common iliac artery aneurysm repaired electively at a size of 3Rubio5 cm, or if growth exceeds 0Rubio5 cm in 6 months. With his aneurysmal occlusive disease, I am unsure if he will be a candidate for endovascular intervention.  My plan is to see him on a 50-month basis with repeat imaging. We discussed the signs and symptoms of rupture, and I asked him to call 911 immediately should any of these occur.    Victorino Sparrow, MD [facility] [phone]

## 2023-10-20 ENCOUNTER — Ambulatory Visit (INDEPENDENT_AMBULATORY_CARE_PROVIDER_SITE_OTHER): Payer: Medicare Other | Admitting: Neurology

## 2023-10-20 ENCOUNTER — Encounter: Payer: Self-pay | Admitting: Neurology

## 2023-10-20 VITALS — BP 142/81 | HR 80 | Ht 70.0 in | Wt 211.0 lb

## 2023-10-20 DIAGNOSIS — G629 Polyneuropathy, unspecified: Secondary | ICD-10-CM

## 2023-10-20 DIAGNOSIS — R208 Other disturbances of skin sensation: Secondary | ICD-10-CM

## 2023-10-20 DIAGNOSIS — L4050 Arthropathic psoriasis, unspecified: Secondary | ICD-10-CM

## 2023-10-20 DIAGNOSIS — M51369 Other intervertebral disc degeneration, lumbar region without mention of lumbar back pain or lower extremity pain: Secondary | ICD-10-CM | POA: Diagnosis not present

## 2023-10-20 MED ORDER — LAMOTRIGINE 25 MG PO TABS: ORAL_TABLET | ORAL | 5 refills | Status: DC

## 2023-10-20 NOTE — Progress Notes (Signed)
[facility]  PATIENT: Martin Rubio DOB: May 09, 1946  REFERRING DOCTOR OR PCP:  Willow Ora, MD SOURCE: patient, notes from PCP  _________________________________   HISTORICAL  CHIEF COMPLAINT:  Chief Complaint  Patient presents with   New Patient (Initial Visit)    Pt in room 10 alone. New patient here for worsening neuropathy. Pt feels pin and needles on toes, numbness, which is now radiating up both legs.    HISTORY OF PRESENT ILLNESS:  I had the pleasure of seeing your patient, Martin Rubio, at [facility] for a neurologic consultation regarding his painful dysesthesias.     He is a 77 year old man who has had dysesthetic pain in his legs x 10 years.    He saw a physician at that time and had a few ESIs without much benefit.    He notes a pins and needles sensation in his toes and a burning sensation in the bottom of the feet.  He denies pain above the ankles.    He notes no definite weakness but the left leg has buckled a few times.   He feels the buckling is more liely due to knee DJD. Marland Kitchen  He notes mild LBP chronically. Pain is worse if he leans forward.    He has had soe urinary hesitancy and needed a TURP like procedure  Lyrica 50 mg made him sleepy so he just has taken at night.   He does not recall trying other medications.  He also has noted leg cramps and takes magnesium.   He tries to go to the gym a few times a month and does exercises on machines.    He had a cholecystitis recently.     Lumbar x-rays showed DDD at L3L4, and facet hypertrophy at L4L5, L5S1  He has psoriatic arthritis.      REVIEW OF SYSTEMS: Constitutional: No fevers, chills, sweats, or change in appetite Eyes: No visual changes, double vision, eye pain Ear, nose and throat: No hearing loss, ear pain, nasal congestion, sore throat Cardiovascular: No chest pain.  He has a pacemaker.   Respiratory:  No shortness of breath at rest or with exertion.   No  wheezes GastrointestinaI: No nausea, vomiting, diarrhea, abdominal pain, fecal incontinence Genitourinary: He has urinary hesitancy and urgency.  He sees urology.. Musculoskeletal:  No neck pain, back pain Integumentary: No rash, pruritus, skin lesions Neurological: as above Psychiatric: No depression at this time.  No anxiety Endocrine: No palpitations, diaphoresis, change in appetite, change in weigh or increased thirst Hematologic/Lymphatic:  No anemia, purpura, petechiae. Allergic/Immunologic: No itchy/runny eyes, nasal congestion, recent allergic reactions, rashes  ALLERGIES: Allergies  Allergen Reactions   Codeine     Tension, nasty feeling    Nsaids Other (See Comments)    Renal insufficiency   Prednisone Other (See Comments)    Unknown/ sometimes takes with lower dose   Statins Other (See Comments)    Joint pain    Sulfa Antibiotics Other (See Comments)    Joint pain    HOME MEDICATIONS:  Current Outpatient Medications:    amLODipine (NORVASC) 5 MG tablet, Take 1 tablet (5 mg total) by mouth daily., Disp: 90 tablet, Rfl: 3   cholecalciferol (VITAMIN D3) 25 MCG (1000 UNIT) tablet, Take 2,000 Units by mouth daily in the afternoon., Disp: , Rfl:    Cyanocobalamin (VITAMIN B 12) 500 MCG TABS, Take 1,000 mcg by mouth daily in the afternoon., Disp: , Rfl:    diclofenac  Sodium (VOLTAREN) 1 % GEL, Apply 4 g topically in the morning and at bedtime., Disp: 100 g, Rfl: 6   dofetilide (TIKOSYN) 250 MCG capsule, Take 1 capsule (250 mcg total) by mouth 2 (two) times daily., Disp: 180 capsule, Rfl: 1   Evolocumab (REPATHA SURECLICK) 140 MG/ML SOAJ, Inject 140 mg into the skin every 14 (fourteen) days., Disp: 2 mL, Rfl: 11   famotidine (PEPCID) 20 MG tablet, Take 1 tablet (20 mg total) by mouth at bedtime., Disp: 30 tablet, Rfl: 11   ferrous fumarate (FERRETTS) 325 (106 Fe) MG TABS tablet, Take 1 tablet (106 mg of iron total) by mouth 2 (two) times daily. (Patient taking differently:  Take 1 tablet by mouth daily.), Disp: 180 tablet, Rfl: 1   fluticasone (FLONASE) 50 MCG/ACT nasal spray, Place 1 spray into both nostrils as needed for allergies., Disp: , Rfl:    hydrOXYzine (VISTARIL) 25 MG capsule, Take 1 capsule (25 mg total) by mouth every 8 (eight) hours as needed., Disp: 60 capsule, Rfl: 3   levothyroxine (SYNTHROID) 137 MCG tablet, Take 1 tablet (137 mcg total) by mouth daily before breakfast., Disp: 90 tablet, Rfl: 1   losartan (COZAAR) 50 MG tablet, TAKE ONE TABLET BY MOUTH EVERY MORNING AND TAKE ONE TABLET BY MOUTH AT BEDTIME, Disp: 180 tablet, Rfl: 2   meclizine (ANTIVERT) 25 MG tablet, Take 25 mg by mouth 3 (three) times daily as needed for dizziness., Disp: , Rfl:    Melatonin 5 MG TABS, Take 5 mg by mouth at bedtime., Disp: , Rfl:    methocarbamol (ROBAXIN) 500 MG tablet, Take 1 tablet (500 mg total) by mouth every 8 (eight) hours as needed for muscle spasms., Disp: 30 tablet, Rfl: 1   OVER THE COUNTER MEDICATION, Take 2 capsules by mouth daily in the afternoon. Nervprin - Healthy Nerve Support - Peripheral Neuropathy Relief, Disp: , Rfl:    pantoprazole (PROTONIX) 40 MG tablet, Take 1 tablet (40 mg total) by mouth daily., Disp: 90 tablet, Rfl: 1   pregabalin (LYRICA) 50 MG capsule, Take 50 mg by mouth at bedtime as needed., Disp: , Rfl:    rivaroxaban (XARELTO) 20 MG TABS tablet, Take 1 tablet (20 mg total) by mouth daily with supper. Dose change, please call [phone], Disp: 90 tablet, Rfl: 1   tadalafil (CIALIS) 20 MG tablet, Take 20 mg by mouth daily as needed for erectile dysfunction., Disp: , Rfl:    tamsulosin (FLOMAX) 0.4 MG CAPS capsule, Take 0.4 mg by mouth at bedtime., Disp: , Rfl:    triamcinolone cream (KENALOG) 0.1 %, Apply 1 application  topically daily as needed for itching., Disp: , Rfl:   PAST MEDICAL HISTORY: Past Medical History:  Diagnosis Date   Bronchitis    Diverticulosis    FH: BPH (benign prostatic hypertrophy)    History of Graves'  disease 01/19/2018   S/p ablation   HTN (hypertension)    Hyperlipidemia    Obesity    Psoriatic arthritis (HCC)    Rheumatoid arthritis (HCC) 08/07/2014   Sleep apnea    CPAP   Thyroid disease     PAST SURGICAL HISTORY: Past Surgical History:  Procedure Laterality Date   ABDOMINAL AORTOGRAM W/LOWER EXTREMITY N/A 01/14/2022   Procedure: ABDOMINAL AORTOGRAM W/LOWER EXTREMITY;  Surgeon: Iran Ouch, MD;  [facility]: [facility];  Service: Cardiovascular;  Laterality: N/A;   ATRIAL FIBRILLATION ABLATION N/A 12/24/2022   Procedure: ATRIAL FIBRILLATION ABLATION;  Surgeon: Maurice Small, MD;  Location:  [facility];  Service: Cardiovascular;  Laterality: N/A;   CHOLECYSTECTOMY N/A 09/18/2023   Procedure: LAPAROSCOPIC CHOLECYSTECTOMY WITH INTRAOPERATIVE CHOLANGIOGRAM;  Surgeon: Kinsinger, De Blanch, MD;  Location: [facility] OR;  Service: General;  Laterality: N/A;   INGUINAL HERNIA REPAIR     bilateral   KNEE SURGERY     bilateral arthroscopic   l foot surgery Left 12/2020   PACEMAKER IMPLANT N/A 12/13/2019   Procedure: PACEMAKER IMPLANT;  Surgeon: Marinus Maw, MD;  Location: [facility];  Service: Cardiovascular;  Laterality: N/A;   TRANSURETHRAL RESECTION OF PROSTATE     UMBILICAL HERNIA REPAIR      FAMILY HISTORY: Family History  Problem Relation Age of Onset   CAD Mother 64       CABG   CAD Father 53       Died age 51   Diabetes Brother    Lung cancer Maternal Grandmother    Prostate cancer Neg Hx    Colon cancer Neg Hx     SOCIAL HISTORY: Social History   Socioeconomic History   Marital status: Married    Spouse name: Not on file   Number of children: 2   Years of education: Not on file   Highest education level: Associate degree: occupational, Scientist, product/process development, or vocational program  Occupational History   Occupation: Retired    Associate Professor: [employer]  Tobacco Use   Smoking status: Former    Types: Cigarettes   Smokeless tobacco: Never    Tobacco comments:    Former smoker Quit 46 years ago. 01/21/23  Vaping Use   Vaping status: Never Used  Substance and Sexual Activity   Alcohol use: No   Drug use: No   Sexual activity: Not on file  Other Topics Concern   Not on file  Social History Narrative   Lives with wife.   Social Determinants of Health   Financial Resource Strain: Low Risk  (10/01/2023)   Overall Financial Resource Strain (CARDIA)    Difficulty of Paying Living Expenses: Not hard at all  Food Insecurity: No Food Insecurity (10/01/2023)   Hunger Vital Sign    Worried About Running Out of Food in the Last Year: Never true    Ran Out of Food in the Last Year: Never true  Transportation Needs: No Transportation Needs (10/01/2023)   PRAPARE - Administrator, Civil Service (Medical): No    Lack of Transportation (Non-Medical): No  Physical Activity: Insufficiently Active (10/01/2023)   Exercise Vital Sign    Days of Exercise per Week: 1 day    Minutes of Exercise per Session: 10 min  Stress: No Stress Concern Present (10/01/2023)   [employer] of Occupational Health - Occupational Stress Questionnaire    Feeling of Stress : Not at all  Social Connections: Unknown (10/01/2023)   Social Connection and Isolation Panel [NHANES]    Frequency of Communication with Friends and Family: More than three times a week    Frequency of Social Gatherings with Friends and Family: Once a week    Attends Religious Services: More than 4 times per year    Active Member of [employer] or Organizations: Patient declined    Attends Banker Meetings: Not on file    Marital Status: Married  Intimate Partner Violence: Not At Risk (09/17/2023)   Humiliation, Afraid, Rape, and Kick questionnaire    Fear of Current or Ex-Partner: No    Emotionally Abused: No    Physically Abused:  No    Sexually Abused: No       PHYSICAL EXAM  Vitals:   10/20/23 1243  BP: (!) 142/81  Pulse: 80  Weight: 211 lb  (95.7 kg)  Height: 5\' 10"  (1.778 m)    Body mass index is 30.28 kg/m.   General: The patient is well-developed and well-nourished and in no acute distress  HEENT:  Head is [place]/AT.  Sclera are anicteric.    Neck: No carotid bruits are noted.  The neck is nontender.  Cardiovascular: The heart has a regular rate and rhythm with a normal S1 and S2. There were no murmurs, gallops or rubs.    Skin: Extremities are without rash or  edema.  Musculoskeletal:  Back is nontender  Neurologic Exam  Mental status: The patient is alert and oriented x 3 at the time of the examination. The patient has apparent normal recent and remote memory, with an apparently normal attention span and concentration ability.   Speech is normal.  Cranial nerves: Extraocular movements are full. Pupils are equal, round, and reactive to light and accomodation.  There is good facial sensation to soft touch bilaterally.Facial strength is normal.  Trapezius and sternocleidomastoid strength is normal. No dysarthria is noted.  The tongue is midline, and the patient has symmetric elevation of the soft palate. No obvious hearing deficits are noted.  Motor:  Muscle bulk is normal.   Tone is normal. Strength is  5 / 5 in all 4 extremities.   Sensory: Sensory testing is intact to pinprick, soft touch and vibration sensation in the arms.   Normal pinprick in feet but 40% vibration sensation at ankle and absent at toes  Coordination: Cerebellar testing reveals good finger-nose-finger and heel-to-shin bilaterally.  Gait and station: Station is normal.   Gait is normal. Tandem gait is normal. Romberg is negative.   Reflexes: Deep tendon reflexes are symmetric and normal bilaterally.   Plantar responses are flexor.    DIAGNOSTIC DATA (LABS, IMAGING, TESTING) - I reviewed patient records, labs, notes, testing and imaging myself where available.  Lab Results  Component Value Date   WBC 6.0 09/22/2023   HGB 15.7 09/22/2023    HCT 47.2 09/22/2023   MCV 84.7 09/22/2023   PLT 134 (L) 09/22/2023      Component Value Date/Time   NA 137 09/22/2023 1046   NA 144 09/14/2023 0000   K 4.0 09/22/2023 1046   CL 101 09/22/2023 1046   CO2 24 09/22/2023 1046   GLUCOSE 97 09/22/2023 1046   BUN 18 09/22/2023 1046   BUN 18 09/14/2023 0000   CREATININE 1.54 (H) 09/22/2023 1046   CREATININE 1.02 01/22/2017 1503   CALCIUM 9.0 09/22/2023 1046   PROT 6.6 09/22/2023 1046   PROT 6.3 11/01/2017 0939   ALBUMIN 3.8 09/22/2023 1046   ALBUMIN 4.1 11/01/2017 0939   AST 31 09/22/2023 1046   ALT 37 09/22/2023 1046   ALKPHOS 70 09/22/2023 1046   BILITOT 0.8 09/22/2023 1046   BILITOT 0.5 11/01/2017 0939   GFRNONAA 46 (L) 09/22/2023 1046   GFRAA 56 (L) 10/18/2020 1141   Lab Results  Component Value Date   CHOL 122 09/17/2023   HDL 36 (L) 09/17/2023   LDLCALC 61 09/17/2023   TRIG 123 09/17/2023   CHOLHDL 3.4 09/17/2023   Lab Results  Component Value Date   HGBA1C 5.5 05/10/2023   Lab Results  Component Value Date   VITAMINB12 1,108 (H) 02/29/2020   Lab Results  Component Value  Date   TSH 0.71 05/10/2023       ASSESSMENT AND PLAN  Polyneuropathy - Plan: Multiple Myeloma Panel (SPEP&IFE w/QIG), Vitamin B12, Sjogren's syndrome antibods(ssa + ssb), Copper, serum  Dysesthesia - Plan: Multiple Myeloma Panel (SPEP&IFE w/QIG), Vitamin B12, Sjogren's syndrome antibods(ssa + ssb), Copper, serum  Degeneration of intervertebral disc of lumbar region, unspecified whether pain present  Psoriatic arthritis (HCC)  In summary, Mr. Jaggers is a 77 year old man with a 10-year history of dysesthesias in his feet that have progressed over the last year or so.  The symmetric symptoms appear to be due to a length dependent polyneuropathy affecting both large and small fibers.  He does have psoriatic arthritis that may place him at a higher risk of an immune mediated polyneuropathy.  Will check some blood work including SPEP/IEF and  SSA/SSB.  Additionally we will check B12 and copper levels for deficiencies.  Based on the results, further treatment or investigations may be necessary.  Additionally I will start lamotrigine 25 mg and titrate up to 50 mg p.o. twice daily for the dysesthetic neuropathic pain.  He was unable to tolerate Lyrica in the past.  I will hold off on an NCV/EMG study as symptoms are just in the feet and the test would probably not change what we do.  He will return to see me in 6 months or sooner if there are new or worsening neurologic symptoms.  Thank you for asking to see Mr.  Please let me know further assistance with him or other patients in the future.  Barney Gertsch A. Epimenio Foot, MD, [facility] 10/20/2023, 1:04 PM Certified in Neurology, Clinical Neurophysiology, Sleep Medicine and Neuroimaging  [facility] [address] [place], [place] [address] [phone]

## 2023-10-20 NOTE — Patient Instructions (Signed)
Take one pill daily x 1 week,  °then one pill twice daily x 1 week,  °then one pill three times daily x 1 week,  °then 2 pills twice daily  °

## 2023-10-21 ENCOUNTER — Ambulatory Visit (INDEPENDENT_AMBULATORY_CARE_PROVIDER_SITE_OTHER)
Admission: RE | Admit: 2023-10-21 | Discharge: 2023-10-21 | Disposition: A | Discharge status: Home / Self Care | Payer: Medicare Other | Source: Ambulatory Visit | Attending: Vascular Surgery | Admitting: Vascular Surgery

## 2023-10-21 ENCOUNTER — Ambulatory Visit (HOSPITAL_COMMUNITY)
Admission: RE | Admit: 2023-10-21 | Discharge: 2023-10-21 | Disposition: A | Discharge status: Home / Self Care | Payer: Medicare Other | Source: Ambulatory Visit | Attending: Vascular Surgery | Admitting: Vascular Surgery

## 2023-10-21 ENCOUNTER — Encounter: Payer: Self-pay | Admitting: Vascular Surgery

## 2023-10-21 ENCOUNTER — Ambulatory Visit (INDEPENDENT_AMBULATORY_CARE_PROVIDER_SITE_OTHER): Payer: Medicare Other | Admitting: Vascular Surgery

## 2023-10-21 VITALS — BP 133/80 | HR 65 | Temp 98.3°F | Resp 20 | Ht 70.0 in | Wt 208.0 lb

## 2023-10-21 DIAGNOSIS — I70212 Atherosclerosis of native arteries of extremities with intermittent claudication, left leg: Secondary | ICD-10-CM | POA: Diagnosis not present

## 2023-10-21 DIAGNOSIS — I700 Atherosclerosis of aorta: Secondary | ICD-10-CM | POA: Diagnosis not present

## 2023-10-21 DIAGNOSIS — I70219 Atherosclerosis of native arteries of extremities with intermittent claudication, unspecified extremity: Secondary | ICD-10-CM | POA: Insufficient documentation

## 2023-10-21 DIAGNOSIS — I723 Aneurysm of iliac artery: Secondary | ICD-10-CM

## 2023-10-21 DIAGNOSIS — I7143 Infrarenal abdominal aortic aneurysm, without rupture: Secondary | ICD-10-CM | POA: Diagnosis not present

## 2023-10-21 LAB — VAS US ABI WITH/WO TBI
Left ABI: 0.95
Right ABI: 1.03

## 2023-10-22 NOTE — Progress Notes (Signed)
Agree with the assessment and plan as outlined by Jennifer Lemmon, PA-C. ? ?Jaja Switalski, DO, FACG ? ?

## 2023-10-27 LAB — MULTIPLE MYELOMA PANEL, SERUM
Albumin SerPl Elph-Mcnc: 4 g/dL (ref 2.9–4.4)
Albumin/Glob SerPl: 1.5 (ref 0.7–1.7)
Alpha 1: 0.2 g/dL (ref 0.0–0.4)
Alpha2 Glob SerPl Elph-Mcnc: 0.8 g/dL (ref 0.4–1.0)
B-Globulin SerPl Elph-Mcnc: 1 g/dL (ref 0.7–1.3)
Gamma Glob SerPl Elph-Mcnc: 0.7 g/dL (ref 0.4–1.8)
Globulin, Total: 2.7 g/dL (ref 2.2–3.9)
IgA/Immunoglobulin A, Serum: 225 mg/dL (ref 61–437)
IgG (Immunoglobin G), Serum: 743 mg/dL (ref 603–1613)
IgM (Immunoglobulin M), Srm: 72 mg/dL (ref 15–143)
Total Protein: 6.7 g/dL (ref 6.0–8.5)

## 2023-10-27 LAB — SJOGREN'S SYNDROME ANTIBODS(SSA + SSB)
ENA SSA (RO) Ab: <0.2 AI (ref 0.0–0.9)
ENA SSB (LA) Ab: <0.2 AI (ref 0.0–0.9)

## 2023-10-27 LAB — COPPER, SERUM: Copper: 92 ug/dL (ref 69–132)

## 2023-10-27 LAB — VITAMIN B12: Vitamin B-12: 965 pg/mL (ref 232–1245)

## 2023-10-29 ENCOUNTER — Other Ambulatory Visit: Payer: Self-pay

## 2023-10-29 DIAGNOSIS — I7143 Infrarenal abdominal aortic aneurysm, without rupture: Secondary | ICD-10-CM

## 2023-10-29 DIAGNOSIS — I70212 Atherosclerosis of native arteries of extremities with intermittent claudication, left leg: Secondary | ICD-10-CM

## 2023-11-01 ENCOUNTER — Encounter: Payer: Self-pay | Admitting: Urology

## 2023-11-01 ENCOUNTER — Ambulatory Visit (INDEPENDENT_AMBULATORY_CARE_PROVIDER_SITE_OTHER): Payer: Medicare Other | Admitting: Urology

## 2023-11-01 VITALS — BP 137/71 | HR 68 | Ht 70.0 in | Wt 208.0 lb

## 2023-11-01 DIAGNOSIS — Z87898 Personal history of other specified conditions: Secondary | ICD-10-CM | POA: Diagnosis not present

## 2023-11-01 DIAGNOSIS — N401 Enlarged prostate with lower urinary tract symptoms: Secondary | ICD-10-CM | POA: Diagnosis not present

## 2023-11-01 DIAGNOSIS — N138 Other obstructive and reflux uropathy: Secondary | ICD-10-CM

## 2023-11-01 LAB — URINALYSIS, ROUTINE W REFLEX MICROSCOPIC
Bilirubin, UA: NEGATIVE
Glucose, UA: NEGATIVE
Ketones, UA: NEGATIVE
Leukocytes,UA: NEGATIVE
Nitrite, UA: NEGATIVE
RBC, UA: NEGATIVE
Specific Gravity, UA: 1.01 (ref 1.005–1.030)
Urobilinogen, Ur: 0.2 mg/dL (ref 0.2–1.0)
pH, UA: 5.5 (ref 5.0–7.5)

## 2023-11-01 LAB — MICROSCOPIC EXAMINATION

## 2023-11-01 LAB — BLADDER SCAN AMB NON-IMAGING

## 2023-11-01 MED ORDER — TROSPIUM CHLORIDE 20 MG PO TABS
20.0000 mg | ORAL_TABLET | Freq: Every day | ORAL | 5 refills | Status: DC
Start: 2023-11-01 — End: 2023-12-15

## 2023-11-01 NOTE — Progress Notes (Signed)
Assessment: 1. BPH with obstruction/lower urinary tract symptoms   2. History of urinary retention     Plan: Continue tamsulosin 0.4 mg daily. Trial of tropsium 20 mg at bedtime.  Rx sent. Return to office in 1 month.  Chief Complaint:  Chief Complaint  Patient presents with   Benign Prostatic Hypertrophy    History of Present Illness:  Martin Rubio is a 77 y.o. male who is seen for continued evaluation of BPH with obstruction and urinary retention. He was previously followed at [facility] health urology in [facility].  His last visit with me was in January 2022.  Urologic History: History of BPH with obstruction.  He is status post a greenlight PVP on 06/01/2013.  His urinary symptoms improved significantly following the procedure.  Cystoscopy in September 2014 showed a widely patent prostatic urethra.  At his visit in 1/22, he noted some slight increase in his urinary symptoms with decreased dream, urgency, and nocturia.  He is not having any dysuria or gross hematuria. IPSS = 14.  PSA results: 11/18 1.37 11/19 1.80 12/20 0.93 1/22 1.48  He has history of erectile dysfunction.  He has previously used tadalafil 5 mg daily as well as generic sildenafil.  He reported some flushing with 60 mg of sildenafil.  He has most recently been using tadalafil 20 mg as needed.  He has been managed with tamsulosin with fairly good control of his urinary symptoms.  He was given a trial of Myrbetriq in April 2023.  He was changed to oxybutynin XL 10 mg daily due to cost of Myrbetriq.  He was referred to nephrology for chronic kidney disease.  CT imaging from 2023 showed mild renal atrophy without hydronephrosis.  Renal ultrasound in July 2023 showed bilateral renal calculi.  KUB from 7/23 showed no obvious stones.  He had an episode of urinary retention following laparoscopic cholecystectomy on 09/18/2023.  A Foley catheter was left in place and he was taking tamsulosin.  His catheter was  removed after a voiding trial on 09/27/2023. He was seen in urgent care on 09/30/2023 with dysuria.  Urinalysis was unremarkable. He was voiding spontaneously after the catheter was removed.  The dysuria improved.  He continued with urgency and some hesitancy.  No gross hematuria or flank pain.  He continued on tamsulosin and oxybutynin XL. IPSS = 13. PVR = 90 ml.  He returns today for scheduled follow-up.  He continues on tamsulosin.  He reports voiding with a good stream.  No dysuria.  His main complaint today is nocturia 1-2 times with urgency primarily at night. IPSS = 12.  Portions of the above documentation were copied from a prior visit for review purposes only.   Past Medical History:  Past Medical History:  Diagnosis Date   Bronchitis    Diverticulosis    FH: BPH (benign prostatic hypertrophy)    History of Graves' disease 01/19/2018   S/p ablation   HTN (hypertension)    Hyperlipidemia    Obesity    Psoriatic arthritis (HCC)    Rheumatoid arthritis (HCC) 08/07/2014   Sleep apnea    CPAP   Thyroid disease     Past Surgical History:  Past Surgical History:  Procedure Laterality Date   ABDOMINAL AORTOGRAM W/LOWER EXTREMITY N/A 01/14/2022   Procedure: ABDOMINAL AORTOGRAM W/LOWER EXTREMITY;  Surgeon: Iran Ouch, MD;  Location: [facility];  Service: Cardiovascular;  Laterality: N/A;   ATRIAL FIBRILLATION ABLATION N/A 12/24/2022   Procedure: ATRIAL FIBRILLATION  ABLATION;  Surgeon: Maurice Small, MD;  Location: [facility];  Service: Cardiovascular;  Laterality: N/A;   CHOLECYSTECTOMY N/A 09/18/2023   Procedure: LAPAROSCOPIC CHOLECYSTECTOMY WITH INTRAOPERATIVE CHOLANGIOGRAM;  Surgeon: Kinsinger, De Blanch, MD;  Location: [facility];  Service: General;  Laterality: N/A;   INGUINAL HERNIA REPAIR     bilateral   KNEE SURGERY     bilateral arthroscopic   l foot surgery Left 12/2020   PACEMAKER IMPLANT N/A 12/13/2019   Procedure: PACEMAKER IMPLANT;   Surgeon: Marinus Maw, MD;  Location: [facility];  Service: Cardiovascular;  Laterality: N/A;   TRANSURETHRAL RESECTION OF PROSTATE     UMBILICAL HERNIA REPAIR      Allergies:  Allergies  Allergen Reactions   Codeine     Tension, nasty feeling    Nsaids Other (See Comments)    Renal insufficiency   Prednisone Other (See Comments)    Unknown/ sometimes takes with lower dose   Statins Other (See Comments)    Joint pain    Sulfa Antibiotics Other (See Comments)    Joint pain    Family History:  Family History  Problem Relation Age of Onset   CAD Mother 23       CABG   CAD Father 49       Died age 18   Diabetes Brother    Lung cancer Maternal Grandmother    Prostate cancer Neg Hx    Colon cancer Neg Hx     Social History:  Social History   Tobacco Use   Smoking status: Former    Types: Cigarettes   Smokeless tobacco: Never   Tobacco comments:    Former smoker Quit 46 years ago. 01/21/23  Vaping Use   Vaping status: Never Used  Substance Use Topics   Alcohol use: No   Drug use: No    ROS: Constitutional:  Negative for fever, chills, weight loss CV: Negative for chest pain, previous MI, hypertension Respiratory:  Negative for shortness of breath, wheezing, sleep apnea, frequent cough GI:  Negative for nausea, vomiting, bloody stool, GERD  Physical exam: BP 137/71   Pulse 68   Ht 5\' 10"  (1.778 m)   Wt 208 lb (94.3 kg)   BMI 29.84 kg/m  GENERAL APPEARANCE:  Well appearing, well developed, well nourished, NAD HEENT:  Atraumatic, normocephalic, oropharynx clear NECK:  Supple without lymphadenopathy or thyromegaly ABDOMEN:  Soft, non-tender, no masses EXTREMITIES:  Moves all extremities well, without clubbing, cyanosis, or edema NEUROLOGIC:  Alert and oriented x 3, normal gait, CN II-XII grossly intact MENTAL STATUS:  appropriate BACK:  Non-tender to palpation, No CVAT SKIN:  Warm, dry, and intact   Results: U/A: 0-5 WBCs, 0-2 RBCs  PVR =  0 ml

## 2023-11-08 ENCOUNTER — Other Ambulatory Visit: Payer: Self-pay

## 2023-11-08 DIAGNOSIS — I480 Paroxysmal atrial fibrillation: Secondary | ICD-10-CM

## 2023-11-08 MED ORDER — RIVAROXABAN 20 MG PO TABS: 20.0000 mg | ORAL_TABLET | Freq: Every day | ORAL | 1 refills | Status: DC

## 2023-11-08 NOTE — Telephone Encounter (Signed)
Prescription refill request for Xarelto received.  Indication: Afib  Last office visit: 10/18/23 Veleta Miners)  Weight: 94.3kg Age: 77 Scr: 1.54 (09/22/23)  CrCl: 52.25ml/min  Appropriate dose. Refill sent.

## 2023-11-22 ENCOUNTER — Other Ambulatory Visit: Payer: Self-pay | Admitting: Family Medicine

## 2023-12-06 ENCOUNTER — Ambulatory Visit: Payer: Medicare Other | Admitting: Urology

## 2023-12-06 NOTE — Progress Notes (Deleted)
Assessment: 1. BPH with obstruction/lower urinary tract symptoms   2. History of urinary retention     Plan: Continue tamsulosin 0.4 mg daily. Trial of tropsium 20 mg at bedtime.  Rx sent. Return to office in 1 month.  Chief Complaint:  No chief complaint on file.   History of Present Illness:  Martin Rubio is a 77 y.o. male who is seen for continued evaluation of BPH with obstruction and urinary retention. He was previously followed at [facility] health urology in [facility].  His last visit with me was in January 2022.  Urologic History: History of BPH with obstruction.  He is status post a greenlight PVP on 06/01/2013.  His urinary symptoms improved significantly following the procedure.  Cystoscopy in September 2014 showed a widely patent prostatic urethra.  At his visit in 1/22, he noted some slight increase in his urinary symptoms with decreased dream, urgency, and nocturia.  He is not having any dysuria or gross hematuria. IPSS = 14.  PSA results: 11/18 1.37 11/19 1.80 12/20 0.93 1/22 1.48  He has history of erectile dysfunction.  He has previously used tadalafil 5 mg daily as well as generic sildenafil.  He reported some flushing with 60 mg of sildenafil.  He has most recently been using tadalafil 20 mg as needed.  He has been managed with tamsulosin with fairly good control of his urinary symptoms.  He was given a trial of Myrbetriq in April 2023.  He was changed to oxybutynin XL 10 mg daily due to cost of Myrbetriq.  He was referred to nephrology for chronic kidney disease.  CT imaging from 2023 showed mild renal atrophy without hydronephrosis.  Renal ultrasound in July 2023 showed bilateral renal calculi.  KUB from 7/23 showed no obvious stones.  He had an episode of urinary retention following laparoscopic cholecystectomy on 09/18/2023.  A Foley catheter was left in place and he was taking tamsulosin.  His catheter was removed after a voiding trial on 09/27/2023. He  was seen in urgent care on 09/30/2023 with dysuria.  Urinalysis was unremarkable. He was voiding spontaneously after the catheter was removed.  The dysuria improved.  He continued with urgency and some hesitancy.  No gross hematuria or flank pain.  He continued on tamsulosin and oxybutynin XL. IPSS = 13. PVR = 90 ml.  At his visit in November 2024, he continued on tamsulosin.  He reported voiding with a good stream.  No dysuria.  His main complaint was nocturia 1-2 times with urgency, primarily at night. IPSS = 12. He was given a trial of trospium 20 mg nightly.  Portions of the above documentation were copied from a prior visit for review purposes only.   Past Medical History:  Past Medical History:  Diagnosis Date   Bronchitis    Diverticulosis    FH: BPH (benign prostatic hypertrophy)    History of Graves' disease 01/19/2018   S/p ablation   HTN (hypertension)    Hyperlipidemia    Obesity    Psoriatic arthritis (HCC)    Rheumatoid arthritis (HCC) 08/07/2014   Sleep apnea    CPAP   Thyroid disease     Past Surgical History:  Past Surgical History:  Procedure Laterality Date   ABDOMINAL AORTOGRAM W/LOWER EXTREMITY N/A 01/14/2022   Procedure: ABDOMINAL AORTOGRAM W/LOWER EXTREMITY;  Surgeon: Iran Ouch, MD;  [facility];  Service: Cardiovascular;  Laterality: N/A;   ATRIAL FIBRILLATION ABLATION N/A 12/24/2022   Procedure: ATRIAL  FIBRILLATION ABLATION;  Surgeon: Maurice Small, MD;  Location: [facility];  Service: Cardiovascular;  Laterality: N/A;   CHOLECYSTECTOMY N/A 09/18/2023   Procedure: LAPAROSCOPIC CHOLECYSTECTOMY WITH INTRAOPERATIVE CHOLANGIOGRAM;  Surgeon: Kinsinger, De Blanch, MD;  Location: [facility] OR;  Service: General;  Laterality: N/A;   INGUINAL HERNIA REPAIR     bilateral   KNEE SURGERY     bilateral arthroscopic   l foot surgery Left 12/2020   PACEMAKER IMPLANT N/A 12/13/2019   Procedure: PACEMAKER IMPLANT;  Surgeon: Marinus Maw, MD;  [facility]: [facility];  Service: Cardiovascular;  Laterality: N/A;   TRANSURETHRAL RESECTION OF PROSTATE     UMBILICAL HERNIA REPAIR      Allergies:  Allergies  Allergen Reactions   Codeine     Tension, nasty feeling    Nsaids Other (See Comments)    Renal insufficiency   Prednisone Other (See Comments)    Unknown/ sometimes takes with lower dose   Statins Other (See Comments)    Joint pain    Sulfa Antibiotics Other (See Comments)    Joint pain    Family History:  Family History  Problem Relation Age of Onset   CAD Mother 75       CABG   CAD Father 36       Died age 26   Diabetes Brother    Lung cancer Maternal Grandmother    Prostate cancer Neg Hx    Colon cancer Neg Hx     Social History:  Social History   Tobacco Use   Smoking status: Former    Types: Cigarettes   Smokeless tobacco: Never   Tobacco comments:    Former smoker Quit 46 years ago. 01/21/23  Vaping Use   Vaping status: Never Used  Substance Use Topics   Alcohol use: No   Drug use: No    ROS: Constitutional:  Negative for fever, chills, weight loss CV: Negative for chest pain, previous MI, hypertension Respiratory:  Negative for shortness of breath, wheezing, sleep apnea, frequent cough GI:  Negative for nausea, vomiting, bloody stool, GERD  Physical exam: There were no vitals taken for this visit. GENERAL APPEARANCE:  Well appearing, well developed, well nourished, NAD HEENT:  Atraumatic, normocephalic, oropharynx clear NECK:  Supple without lymphadenopathy or thyromegaly ABDOMEN:  Soft, non-tender, no masses EXTREMITIES:  Moves all extremities well, without clubbing, cyanosis, or edema NEUROLOGIC:  Alert and oriented x 3, normal gait, CN II-XII grossly intact MENTAL STATUS:  appropriate BACK:  Non-tender to palpation, No CVAT SKIN:  Warm, dry, and intact   Results: U/A:

## 2023-12-07 ENCOUNTER — Other Ambulatory Visit: Payer: Self-pay

## 2023-12-07 MED ORDER — LEVOTHYROXINE SODIUM 137 MCG PO TABS: 137.0000 ug | ORAL_TABLET | Freq: Every day | ORAL | 1 refills | Status: DC

## 2023-12-10 ENCOUNTER — Ambulatory Visit (INDEPENDENT_AMBULATORY_CARE_PROVIDER_SITE_OTHER): Payer: Medicare Other

## 2023-12-10 DIAGNOSIS — I495 Sick sinus syndrome: Secondary | ICD-10-CM

## 2023-12-15 ENCOUNTER — Encounter: Payer: Self-pay | Admitting: Urology

## 2023-12-15 ENCOUNTER — Ambulatory Visit (INDEPENDENT_AMBULATORY_CARE_PROVIDER_SITE_OTHER): Payer: Medicare Other | Admitting: Urology

## 2023-12-15 ENCOUNTER — Other Ambulatory Visit: Payer: Self-pay

## 2023-12-15 VITALS — BP 135/82 | HR 78 | Ht 70.0 in | Wt 213.0 lb

## 2023-12-15 DIAGNOSIS — Z87898 Personal history of other specified conditions: Secondary | ICD-10-CM | POA: Diagnosis not present

## 2023-12-15 DIAGNOSIS — N138 Other obstructive and reflux uropathy: Secondary | ICD-10-CM

## 2023-12-15 DIAGNOSIS — R1013 Epigastric pain: Secondary | ICD-10-CM

## 2023-12-15 DIAGNOSIS — N401 Enlarged prostate with lower urinary tract symptoms: Secondary | ICD-10-CM | POA: Diagnosis not present

## 2023-12-15 LAB — CUP PACEART REMOTE DEVICE CHECK
Battery Remaining Longevity: 112 mo
Battery Voltage: 3 V
Brady Statistic AP VP Percent: 2.64 %
Brady Statistic AP VS Percent: 85.07 %
Brady Statistic AS VP Percent: 0.03 %
Brady Statistic AS VS Percent: 12.27 %
Brady Statistic RA Percent Paced: 87.56 %
Brady Statistic RV Percent Paced: 2.66 %
Date Time Interrogation Session: 20250107130856
Implantable Lead Connection Status: 753985
Implantable Lead Connection Status: 753985
Implantable Lead Implant Date: 20210106
Implantable Lead Implant Date: 20210106
Implantable Lead Location: 753859
Implantable Lead Location: 753860
Implantable Lead Model: 3830
Implantable Lead Model: 5076
Implantable Pulse Generator Implant Date: 20210106
Lead Channel Impedance Value: 323 Ohm
Lead Channel Impedance Value: 361 Ohm
Lead Channel Impedance Value: 380 Ohm
Lead Channel Impedance Value: 513 Ohm
Lead Channel Pacing Threshold Amplitude: 0.5 V
Lead Channel Pacing Threshold Amplitude: 0.75 V
Lead Channel Pacing Threshold Pulse Width: 0.4 ms
Lead Channel Pacing Threshold Pulse Width: 0.4 ms
Lead Channel Sensing Intrinsic Amplitude: 2 mV
Lead Channel Sensing Intrinsic Amplitude: 2 mV
Lead Channel Sensing Intrinsic Amplitude: 25 mV
Lead Channel Sensing Intrinsic Amplitude: 25 mV
Lead Channel Setting Pacing Amplitude: 1.5 V
Lead Channel Setting Pacing Amplitude: 2.5 V
Lead Channel Setting Pacing Pulse Width: 0.4 ms
Lead Channel Setting Sensing Sensitivity: 2 mV
Zone Setting Status: 755011
Zone Setting Status: 755011

## 2023-12-15 LAB — URINALYSIS, ROUTINE W REFLEX MICROSCOPIC
Bilirubin, UA: NEGATIVE
Glucose, UA: NEGATIVE
Ketones, UA: NEGATIVE
Leukocytes,UA: NEGATIVE
Nitrite, UA: NEGATIVE
RBC, UA: NEGATIVE
Specific Gravity, UA: 1.015 (ref 1.005–1.030)
Urobilinogen, Ur: 0.2 mg/dL (ref 0.2–1.0)
pH, UA: 7 (ref 5.0–7.5)

## 2023-12-15 LAB — MICROSCOPIC EXAMINATION

## 2023-12-15 LAB — BLADDER SCAN AMB NON-IMAGING

## 2023-12-15 MED ORDER — ALFUZOSIN HCL ER 10 MG PO TB24: 10.0000 mg | ORAL_TABLET | Freq: Every day | ORAL | 11 refills | Status: DC

## 2023-12-15 MED ORDER — PANTOPRAZOLE SODIUM 40 MG PO TBEC: 40.0000 mg | DELAYED_RELEASE_TABLET | Freq: Every day | ORAL | 1 refills | Status: DC

## 2023-12-15 MED ORDER — GEMTESA 75 MG PO TABS
75.0000 mg | ORAL_TABLET | Freq: Every day | ORAL | Status: DC
Start: 2023-12-15 — End: 2024-01-17

## 2023-12-15 NOTE — Progress Notes (Signed)
 Assessment: 1. BPH with obstruction/lower urinary tract symptoms   2. History of urinary retention     Plan: Trial of alfuzosin  10 mg daily in place of tamsulosin . Trial of Gemtesa  75 mg daily. D/C trospium  Return to office in 1 month for possible cystoscopy.  Chief Complaint:  Chief Complaint  Patient presents with   Benign Prostatic Hypertrophy    History of Present Illness:  Martin Rubio is a 78 y.o. male who is seen for continued evaluation of BPH with obstruction, lower urinary tract symptoms, and history of urinary retention. He was previously followed at [facility] in [facility].  His last visit with me there was in January 2022.  Urologic History: History of BPH with obstruction.  He is status post a greenlight PVP on 06/01/2013.  His urinary symptoms improved significantly following the procedure.  Cystoscopy in September 2014 showed a widely patent prostatic urethra.  At his visit in 1/22, he noted some slight increase in his urinary symptoms with decreased dream, urgency, and nocturia.  He was not having any dysuria or gross hematuria. IPSS = 14.  PSA results: 11/18 1.37 11/19 1.80 12/20 0.93 1/22 1.48  He has history of erectile dysfunction.  He has previously used tadalafil 5 mg daily as well as generic sildenafil .  He reported some flushing with 60 mg of sildenafil .  He has most recently been using tadalafil 20 mg as needed.  He has been managed with tamsulosin  with fairly good control of his urinary symptoms.  He was given a trial of Myrbetriq in April 2023.  He was changed to oxybutynin  XL 10 mg daily due to cost of Myrbetriq.  He was referred to nephrology for chronic kidney disease.  CT imaging from 2023 showed mild renal atrophy without hydronephrosis.  Renal ultrasound in July 2023 showed bilateral renal calculi.  KUB from 7/23 showed no obvious stones.  He had an episode of urinary retention following laparoscopic cholecystectomy on  09/18/2023.  A Foley catheter was left in place and he was taking tamsulosin .  His catheter was removed after a voiding trial on 09/27/2023. He was seen in urgent care on 09/30/2023 with dysuria.  Urinalysis was unremarkable. He was voiding spontaneously after the catheter was removed.  The dysuria improved.  He continued with urgency and some hesitancy.  No gross hematuria or flank pain.  He continued on tamsulosin  and oxybutynin  XL. IPSS = 13. PVR = 90 ml.  At his visit in November 2024, he continued on tamsulosin .  He reported voiding with a good stream.  No dysuria.  His main complaint was nocturia 1-2 times with urgency, primarily at night. IPSS = 12. PVR = 0 ml He was given a trial of trospium  20 mg nightly.  He returns today for scheduled follow-up.  He continues on tamsulosin .  He has taken the trospium  20 mg at night but has not seen any significant change in his symptoms.  He continues to have frequency, urgency, slowing of his stream. IPSS = 14 QOL = 4/6 today  Portions of the above documentation were copied from a prior visit for review purposes only.   Past Medical History:  Past Medical History:  Diagnosis Date   Bronchitis    Diverticulosis    FH: BPH (benign prostatic hypertrophy)    History of Graves' disease 01/19/2018   S/p ablation   HTN (hypertension)    Hyperlipidemia    Obesity    Psoriatic arthritis (HCC)    Rheumatoid  arthritis (HCC) 08/07/2014   Sleep apnea    CPAP   Thyroid  disease     Past Surgical History:  Past Surgical History:  Procedure Laterality Date   ABDOMINAL AORTOGRAM W/LOWER EXTREMITY N/A 01/14/2022   Procedure: ABDOMINAL AORTOGRAM W/LOWER EXTREMITY;  Surgeon: Darron Deatrice LABOR, MD;  [facility];  Service: Cardiovascular;  Laterality: N/A;   ATRIAL FIBRILLATION ABLATION N/A 12/24/2022   Procedure: ATRIAL FIBRILLATION ABLATION;  Surgeon: Nancey Eulas BRAVO, MD;  [facility];  Service: Cardiovascular;   Laterality: N/A;   CHOLECYSTECTOMY N/A 09/18/2023   Procedure: LAPAROSCOPIC CHOLECYSTECTOMY WITH INTRAOPERATIVE CHOLANGIOGRAM;  Surgeon: Kinsinger, Herlene Righter, MD;  Location: [facility];  Service: General;  Laterality: N/A;   INGUINAL HERNIA REPAIR     bilateral   KNEE SURGERY     bilateral arthroscopic   l foot surgery Left 12/2020   PACEMAKER IMPLANT N/A 12/13/2019   Procedure: PACEMAKER IMPLANT;  Surgeon: Waddell Danelle ORN, MD;  [facility];  Service: Cardiovascular;  Laterality: N/A;   TRANSURETHRAL RESECTION OF PROSTATE     UMBILICAL HERNIA REPAIR      Allergies:  Allergies  Allergen Reactions   Codeine     Tension, nasty feeling    Nsaids Other (See Comments)    Renal insufficiency   Prednisone Other (See Comments)    Unknown/ sometimes takes with lower dose   Statins Other (See Comments)    Joint pain    Sulfa Antibiotics Other (See Comments)    Joint pain    Family History:  Family History  Problem Relation Age of Onset   CAD Mother 61       CABG   CAD Father 21       Died age 26   Diabetes Brother    Lung cancer Maternal Grandmother    Prostate cancer Neg Hx    Colon cancer Neg Hx     Social History:  Social History   Tobacco Use   Smoking status: Former    Types: Cigarettes   Smokeless tobacco: Never   Tobacco comments:    Former smoker Quit 46 years ago. 01/21/23  Vaping Use   Vaping status: Never Used  Substance Use Topics   Alcohol use: No   Drug use: No    ROS: Constitutional:  Negative for fever, chills, weight loss CV: Negative for chest pain, previous MI, hypertension Respiratory:  Negative for shortness of breath, wheezing, sleep apnea, frequent cough GI:  Negative for nausea, vomiting, bloody stool, GERD  Physical exam: BP 135/82   Pulse 78   Ht 5' 10 (1.778 m)   Wt 213 lb (96.6 kg)   BMI 30.56 kg/m  GENERAL APPEARANCE:  Well appearing, well developed, well nourished, NAD HEENT:  Atraumatic, normocephalic,  oropharynx clear NECK:  Supple without lymphadenopathy or thyromegaly ABDOMEN:  Soft, non-tender, no masses EXTREMITIES:  Moves all extremities well, without clubbing, cyanosis, or edema NEUROLOGIC:  Alert and oriented x 3, normal gait, CN II-XII grossly intact MENTAL STATUS:  appropriate BACK:  Non-tender to palpation, No CVAT SKIN:  Warm, dry, and intact   Results: U/A: Negative  PVR = 75 ml

## 2023-12-21 ENCOUNTER — Telehealth: Payer: Self-pay | Admitting: Physician Assistant

## 2023-12-21 NOTE — Telephone Encounter (Signed)
 Patient requesting to speak with a nurse in regards to him having abdominal pain. Please advise.

## 2023-12-22 ENCOUNTER — Ambulatory Visit: Payer: Medicare Other | Admitting: Gastroenterology

## 2023-12-22 MED ORDER — DICYCLOMINE HCL 20 MG PO TABS: 20.0000 mg | ORAL_TABLET | Freq: Four times a day (QID) | ORAL | 0 refills | Status: DC | PRN

## 2023-12-22 NOTE — Telephone Encounter (Signed)
 Likely may benefit from cholestyramine, but Amy can decide this when she sees him.  For now he can be on Hyoscyamine sulfate 0.125 mg on medical tabs 1 tab every 6 hours as needed for abdominal cramping and diarrhea #30.   Thanks, JL L

## 2023-12-22 NOTE — Telephone Encounter (Signed)
 Called and spoke with patient. Pt states that he had abdominal pain months ago and he thought it was from ulcers, but he ended up having gallstones. Pt is now s/p cholecystectomy and having intermittent diarrhea and abdominal pain. Most recent episode of diarrhea was in the last 3-4 days. Pt describes as a loose stool, associated with lower abdominal tenderness. Abdomen is not tender to the touch today, no BM yet. Last BM was yesterday. Pt has been eating Ginger candy to help with the nausea and stomach pain.  Ginger candy helps but does not completely resolve symptoms. Pt is wondering if he can get a prescription to help with abdominal pain and diarrhea. He is scheduled for a follow up with Alfonza Iles, PA-C on 01/10/24. Please advise, thanks.

## 2023-12-22 NOTE — Telephone Encounter (Signed)
 Hyoscyamine is not covered by patient's insurance. Spoke with Bridgette Campus and she is OK with changing prescription to Bentyl  20 mg every 6 hours PRN. Prescription has been sent to pharmacy on file. I called and notified patient.

## 2023-12-31 ENCOUNTER — Ambulatory Visit: Payer: Self-pay | Admitting: Internal Medicine

## 2023-12-31 NOTE — Telephone Encounter (Signed)
  Chief Complaint: Flank pain Symptoms: Right sided flank pain  Frequency: Comes and goes  Pertinent Negatives: Patient denies injury fever, chills, nausea, vomiting Disposition: [] ED /[] Urgent Care (no appt availability in office) / [x] Appointment(In office/virtual)/ []  [facility]/ [] Home Care/ [] Refused Recommended Disposition /[] [facility]/ []  Follow-up with PCP Additional Notes: Patient states he has had right sided flank pain this week that comes and goes. Patient states he felt the pain when it /called and reported it was a 7/10 on pain scale but the pain has subsided now. His wife prayed for him and the pain is gone now. Patient states he is unsure if tylenol helps relieve the pain but he has taking it. Patient states it feels like a pulled muscle or something. Care advice was given and patient has been scheduled for an OV on 01/03/24 at 1300.  Copied from CRM [id number]. Topic: Clinical - Red Word Triage >> Dec 31, 2023  2:05 PM Dennison Nancy wrote: Red Word that prompted transfer to Nurse Triage: Patient in a lot of pain under lower right rib been going on over a week and the pain is getting worse Reason for Disposition  MODERATE pain (e.g., interferes with normal activities or awakens from sleep)  Answer Assessment - Initial Assessment Questions 1. LOCATION: "Where does it hurt?" (e.g., left, right)     Right side  2. ONSET: "When did the pain start?"     A few weeks  3. SEVERITY: "How bad is the pain?" (e.g., Scale 1-10; mild, moderate, or severe)   - MILD (1-3): doesn't interfere with normal activities    - MODERATE (4-7): interferes with normal activities or awakens from sleep    - SEVERE (8-10): excruciating pain and patient unable to do normal activities (stays in bed)       7/10 4. PATTERN: "Does the pain come and go, or is it constant?"      Come and got  5. CAUSE: "What do you think is causing the pain?"     I think a pulled muscle  6. OTHER SYMPTOMS:  "Do  you have any other symptoms?" (e.g., fever, abdomen pain, vomiting, leg weakness, burning with urination, blood in urine)     No  Protocols used: Flank Pain-A-AH

## 2024-01-03 ENCOUNTER — Encounter: Payer: Self-pay | Admitting: Physician Assistant

## 2024-01-03 ENCOUNTER — Ambulatory Visit (INDEPENDENT_AMBULATORY_CARE_PROVIDER_SITE_OTHER): Payer: Medicare Other | Admitting: Physician Assistant

## 2024-01-03 VITALS — BP 124/81 | HR 78 | Ht 70.0 in | Wt 219.0 lb

## 2024-01-03 DIAGNOSIS — R1011 Right upper quadrant pain: Secondary | ICD-10-CM | POA: Diagnosis not present

## 2024-01-03 DIAGNOSIS — M65331 Trigger finger, right middle finger: Secondary | ICD-10-CM | POA: Diagnosis not present

## 2024-01-03 NOTE — Progress Notes (Signed)
Established patient visit   Patient: Martin Rubio   DOB: 05-25-1946   78 y.o. Male  MRN: [id number] Visit Date: 01/03/2024  Today's healthcare provider: Alfredia Ferguson, PA-C   Chief Complaint  Patient presents with   Flank Pain    Pain on right side- for several weeks. Right up under ribs. Dull pain. Patient had gallbladder removed months ago. States he is still having abdomen pain. OTC- tylenol-   Hand Pain    Patient noticed "trigger finger" in right middle finger.   Subjective     Pt is post chole 10/13/23 and reports ever since then having intermittent but persistent RUQ pain. Denies any relation to meals, but he has not changed his diet as recommended. Reports worst was 6/10 pain last week.  He has also been having diarrhea, given dicyclomine, he is not sure if it has been working.    He also reports right middle finger trigger finger, that since making the appointment has released. Medications: Outpatient Medications Prior to Visit  Medication Sig   alfuzosin (UROXATRAL) 10 MG 24 hr tablet Take 1 tablet (10 mg total) by mouth daily with breakfast.   amLODipine (NORVASC) 5 MG tablet Take 1 tablet (5 mg total) by mouth daily.   cholecalciferol (VITAMIN D3) 25 MCG (1000 UNIT) tablet Take 2,000 Units by mouth daily in the afternoon.   Cyanocobalamin (VITAMIN B 12) 500 MCG TABS Take 1,000 mcg by mouth daily in the afternoon.   diclofenac Sodium (VOLTAREN) 1 % GEL Apply 4 g topically in the morning and at bedtime.   dicyclomine (BENTYL) 20 MG tablet Take 1 tablet (20 mg total) by mouth every 6 (six) hours as needed (abdominal cramping/diarrhea).   dofetilide (TIKOSYN) 250 MCG capsule Take 1 capsule (250 mcg total) by mouth 2 (two) times daily.   Evolocumab (REPATHA SURECLICK) 140 MG/ML SOAJ Inject 140 mg into the skin every 14 (fourteen) days.   famotidine (PEPCID) 20 MG tablet Take 1 tablet (20 mg total) by mouth at bedtime.   ferrous fumarate (FERRETTS) 325 (106 Fe)  MG TABS tablet Take 1 tablet (106 mg of iron total) by mouth 2 (two) times daily. (Patient taking differently: Take 1 tablet by mouth daily.)   fluticasone (FLONASE) 50 MCG/ACT nasal spray Place 1 spray into both nostrils as needed for allergies.   hydrOXYzine (VISTARIL) 25 MG capsule Take 1 capsule (25 mg total) by mouth every 8 (eight) hours as needed.   lamoTRIgine (LAMICTAL) 25 MG tablet Take one pill daily x 1 week, then one pill twice daily x 1 week, then one pill three times daily x 1 week, then 2 pills twice daily   levothyroxine (SYNTHROID) 137 MCG tablet Take 1 tablet (137 mcg total) by mouth daily before breakfast.   losartan (COZAAR) 50 MG tablet TAKE ONE TABLET BY MOUTH EVERY MORNING AND TAKE ONE TABLET BY MOUTH AT BEDTIME   meclizine (ANTIVERT) 25 MG tablet Take 25 mg by mouth 3 (three) times daily as needed for dizziness.   Melatonin 5 MG TABS Take 5 mg by mouth at bedtime.   methocarbamol (ROBAXIN) 500 MG tablet Take 1 tablet (500 mg total) by mouth every 8 (eight) hours as needed for muscle spasms.   OVER THE COUNTER MEDICATION Take 2 capsules by mouth daily in the afternoon. Nervprin - Healthy Nerve Support - Peripheral Neuropathy Relief   pantoprazole (PROTONIX) 40 MG tablet Take 1 tablet (40 mg total) by mouth daily.   pregabalin (  LYRICA) 50 MG capsule Take 50 mg by mouth at bedtime as needed.   rivaroxaban (XARELTO) 20 MG TABS tablet Take 1 tablet (20 mg total) by mouth daily with supper.   tadalafil (CIALIS) 20 MG tablet Take 20 mg by mouth daily as needed for erectile dysfunction.   triamcinolone cream (KENALOG) 0.1 % Apply 1 application  topically daily as needed for itching.   Vibegron (GEMTESA) 75 MG TABS Take 1 tablet (75 mg total) by mouth daily.   No facility-administered medications prior to visit.    Review of Systems  Constitutional:  Negative for fatigue and fever.  Respiratory:  Negative for cough and shortness of breath.   Cardiovascular:  Negative for chest  pain, palpitations and leg swelling.  Gastrointestinal:  Positive for abdominal pain and diarrhea.  Musculoskeletal:        Trigger finger  Neurological:  Negative for dizziness and headaches.       Objective    BP 124/81   Pulse 78   Ht 5\' 10"  (1.778 m)   Wt 219 lb (99.3 kg)   SpO2 97%   BMI 31.42 kg/m    Physical Exam Vitals reviewed.  Constitutional:      Appearance: He is not ill-appearing.  HENT:     Head: Normocephalic.  Eyes:     Conjunctiva/sclera: Conjunctivae normal.  Cardiovascular:     Rate and Rhythm: Normal rate.  Pulmonary:     Effort: Pulmonary effort is normal. No respiratory distress.  Abdominal:     General: Abdomen is protuberant.     Tenderness: There is no abdominal tenderness.     Hernia: A hernia is present. Hernia is present in the umbilical area.  Musculoskeletal:     Comments: Right palm middle finger-- there is a slight prominence to the tendon sheath. nontender  Neurological:     General: No focal deficit present.     Mental Status: He is alert and oriented to person, place, and time.  Psychiatric:        Mood and Affect: Mood normal.        Behavior: Behavior normal.     No results found for any visits on 01/03/24.  Assessment & Plan    RUQ abdominal pain -     US ABDOMEN LIMITED RUQ (LIVER/GB); Future  Trigger middle finger of right hand  Will repeat RUQ sono but advise pt discuss w/ GI at upcoming appointment  Trigger finger appears to have self resolved   Return if symptoms worsen or fail to improve.      Alfredia Ferguson, PA-C  [facility] at [facility] [phone] (phone) [phone] (fax)  [facility]

## 2024-01-05 ENCOUNTER — Ambulatory Visit (HOSPITAL_BASED_OUTPATIENT_CLINIC_OR_DEPARTMENT_OTHER): Admission: RE | Admit: 2024-01-05 | Payer: Medicare Other | Source: Ambulatory Visit

## 2024-01-06 ENCOUNTER — Other Ambulatory Visit: Payer: Self-pay | Admitting: Physician Assistant

## 2024-01-10 ENCOUNTER — Telehealth: Payer: Self-pay | Admitting: Cardiology

## 2024-01-10 ENCOUNTER — Telehealth: Payer: Self-pay | Admitting: Pharmacy Technician

## 2024-01-10 ENCOUNTER — Other Ambulatory Visit (HOSPITAL_COMMUNITY): Payer: Self-pay

## 2024-01-10 ENCOUNTER — Ambulatory Visit: Payer: Medicare Other | Admitting: Physician Assistant

## 2024-01-10 DIAGNOSIS — I70219 Atherosclerosis of native arteries of extremities with intermittent claudication, unspecified extremity: Secondary | ICD-10-CM

## 2024-01-10 DIAGNOSIS — I2510 Atherosclerotic heart disease of native coronary artery without angina pectoris: Secondary | ICD-10-CM

## 2024-01-10 DIAGNOSIS — E785 Hyperlipidemia, unspecified: Secondary | ICD-10-CM

## 2024-01-10 MED ORDER — REPATHA SURECLICK 140 MG/ML ~~LOC~~ SOAJ: 1.0000 mL | SUBCUTANEOUS | 11 refills | Status: DC

## 2024-01-10 NOTE — Telephone Encounter (Signed)
PA has been approved, can he get refill sent to his pharmacy please     Evolocumab (REPATHA SURECLICK) 140 MG/ML SOAJ   [id number] [place] - [place], [place] - [address] AT [address]

## 2024-01-10 NOTE — Telephone Encounter (Signed)
Pharmacy Patient Advocate Encounter  Received notification from [facility] that Prior Authorization for repatha has been APPROVED from 12/08/23 to 12/05/24. Ran test claim, Copay is $538.64 one month (DEDUCTIBLE). This test claim was processed through [facility]- copay amounts may vary at other pharmacies due to pharmacy/plan contracts, or as the patient moves through the different stages of their insurance plan.   PA #/Case ID/Reference #: [id number]

## 2024-01-10 NOTE — Telephone Encounter (Signed)
Pt called in stating he needs PA for Repatha. He states he uses [employer] now.

## 2024-01-10 NOTE — Telephone Encounter (Signed)
Pharmacy Patient Advocate Encounter   Received notification from Pt Calls Messages that prior authorization for repatha is required/requested.   Insurance verification completed.   The patient is insured through [employer] .   Per test claim: PA required; PA submitted to above mentioned insurance via CoverMyMeds Key/confirmation #/EOC [id number] Status is pending

## 2024-01-12 ENCOUNTER — Encounter: Payer: Self-pay | Admitting: Podiatry

## 2024-01-12 ENCOUNTER — Ambulatory Visit (INDEPENDENT_AMBULATORY_CARE_PROVIDER_SITE_OTHER): Payer: Medicare Other | Admitting: Podiatry

## 2024-01-12 VITALS — Ht 70.0 in | Wt 219.0 lb

## 2024-01-12 DIAGNOSIS — G629 Polyneuropathy, unspecified: Secondary | ICD-10-CM | POA: Diagnosis not present

## 2024-01-12 DIAGNOSIS — M79675 Pain in left toe(s): Secondary | ICD-10-CM

## 2024-01-12 DIAGNOSIS — B351 Tinea unguium: Secondary | ICD-10-CM

## 2024-01-12 DIAGNOSIS — M79674 Pain in right toe(s): Secondary | ICD-10-CM | POA: Diagnosis not present

## 2024-01-12 NOTE — Progress Notes (Signed)
 Chief Complaint  Patient presents with   Callouses    Pt is here due to callous to the bottom if both feet.    SUBJECTIVE Patient presents to office today complaining of elongated, thickened nails that cause pain while ambulating in shoes.  Patient is unable to trim their own nails.  Patient also has symptomatic skin lesions to the plantar aspect of the bilateral forefoot.  Patient also has a long history of peripheral polyneuropathy to the bilateral forefoot.  He experiences burning tingling numbness sensation to the toes bilaterally.  He has tried oral Lyrica  as well as gabapentin  without any significant relief.  He is requesting additional treatment options to discuss to potentially alleviate some of his neuropathic symptoms patient is here for further evaluation and treatment.  Past Medical History:  Diagnosis Date   Bronchitis    Diverticulosis    FH: BPH (benign prostatic hypertrophy)    History of Graves' disease 01/19/2018   S/p ablation   HTN (hypertension)    Hyperlipidemia    Obesity    Psoriatic arthritis (HCC)    Rheumatoid arthritis (HCC) 08/07/2014   Sleep apnea    CPAP   Thyroid  disease     Allergies  Allergen Reactions   Codeine     Tension, nasty feeling    Nsaids Other (See Comments)    Renal insufficiency   Prednisone Other (See Comments)    Unknown/ sometimes takes with lower dose   Statins Other (See Comments)    Joint pain    Sulfa Antibiotics Other (See Comments)    Joint pain     OBJECTIVE General Patient is awake, alert, and oriented x 3 and in no acute distress. Derm Skin is dry and supple bilateral. Negative open lesions or macerations. Remaining integument unremarkable. Nails are tender, long, thickened and dystrophic with subungual debris, consistent with onychomycosis, 1-5 bilateral. No signs of infection noted.  There are some hyperkeratotic tissue to the plantar aspect of the bilateral forefoot secondary to pressure Vasc  DP and PT  pedal pulses palpable bilaterally. Temperature gradient within normal limits.  Neuro grossly intact via light touch Musculoskeletal Exam high arches noted with hammertoe deformity creating retrograde pressure to the bilateral forefoot  ASSESSMENT 1.  Pain due to onychomycosis of toenails both 2.  Symptomatic skin lesions bilateral plantar forefoot 3.  High arches bilateral with hammertoe deformity; metatarsalgia 4.  Chronic peripheral polyneuropathy bilateral lower extremities  PLAN OF CARE -Patient evaluated today.  -Instructed to maintain good pedal hygiene and foot care.  -Mechanical debridement of nails 1-5 bilaterally performed using a nail nipper. Filed with dremel without incident.  -Excisional debridement of the hyperkeratotic tissue was performed using a 312 scalpel.  Salicylic acid and a light dressing applied. -Advised against going barefoot.  Recommend good arch supports to offload pressure from the forefoot -Patient continues to experience neuropathy symptoms to the bilateral forefoot despite conservative treatment.  He has tried both oral gabapentin  and Lyrica  as well as topical applications to alleviate his symptoms with no improvement.  I do believe he would be a good candidate for [employer] application.  Message sent to our preauthorization department to see if he can be approved for this -Return to clinic as needed   Thresa EMERSON Sar, DPM [facility]  Dr. Thresa EMERSON Sar, DPM    [address]  [place], [place] [address]                Office [phone]  Fax [phone]

## 2024-01-13 ENCOUNTER — Ambulatory Visit (HOSPITAL_BASED_OUTPATIENT_CLINIC_OR_DEPARTMENT_OTHER)
Admission: RE | Admit: 2024-01-13 | Discharge: 2024-01-13 | Disposition: A | Discharge status: Home / Self Care | Payer: Medicare Other | Source: Ambulatory Visit | Attending: Physician Assistant | Admitting: Physician Assistant

## 2024-01-13 DIAGNOSIS — Z9049 Acquired absence of other specified parts of digestive tract: Secondary | ICD-10-CM | POA: Diagnosis not present

## 2024-01-13 DIAGNOSIS — R1011 Right upper quadrant pain: Secondary | ICD-10-CM | POA: Insufficient documentation

## 2024-01-14 ENCOUNTER — Encounter: Payer: Self-pay | Admitting: Physician Assistant

## 2024-01-17 ENCOUNTER — Encounter: Payer: Self-pay | Admitting: Urology

## 2024-01-17 ENCOUNTER — Ambulatory Visit (INDEPENDENT_AMBULATORY_CARE_PROVIDER_SITE_OTHER): Payer: Medicare Other | Admitting: Urology

## 2024-01-17 VITALS — BP 166/78 | HR 87 | Ht 70.0 in | Wt 216.0 lb

## 2024-01-17 DIAGNOSIS — N138 Other obstructive and reflux uropathy: Secondary | ICD-10-CM | POA: Diagnosis not present

## 2024-01-17 DIAGNOSIS — N529 Male erectile dysfunction, unspecified: Secondary | ICD-10-CM | POA: Diagnosis not present

## 2024-01-17 DIAGNOSIS — Z87898 Personal history of other specified conditions: Secondary | ICD-10-CM

## 2024-01-17 DIAGNOSIS — N401 Enlarged prostate with lower urinary tract symptoms: Secondary | ICD-10-CM | POA: Diagnosis not present

## 2024-01-17 LAB — URINALYSIS, ROUTINE W REFLEX MICROSCOPIC
Bilirubin, UA: NEGATIVE
Glucose, UA: NEGATIVE
Ketones, UA: NEGATIVE
Leukocytes,UA: NEGATIVE
Nitrite, UA: NEGATIVE
RBC, UA: NEGATIVE
Specific Gravity, UA: 1.015 (ref 1.005–1.030)
Urobilinogen, Ur: 0.2 mg/dL (ref 0.2–1.0)
pH, UA: 6.5 (ref 5.0–7.5)

## 2024-01-17 LAB — MICROSCOPIC EXAMINATION

## 2024-01-17 MED ORDER — GEMTESA 75 MG PO TABS: 75.0000 mg | ORAL_TABLET | Freq: Every day | ORAL | 11 refills | Status: DC

## 2024-01-17 NOTE — Progress Notes (Signed)
 Assessment: 1. BPH with obstruction/lower urinary tract symptoms   2. History of urinary retention   3. Organic impotence     Plan: Continue alfuzosin  10 mg daily  Continue Gemtesa  75 mg daily. Continue tadalafil 20 mg as needed Return to office in 6 weeks  Chief Complaint:  Chief Complaint  Patient presents with   Benign Prostatic Hypertrophy    History of Present Illness:  Martin Rubio is a 78 y.o. male who is seen for continued evaluation of BPH with obstruction, lower urinary tract symptoms, and history of urinary retention. He was previously followed at [facility] in [facility].  His last visit with me there was in January 2022.  Urologic History: History of BPH with obstruction.  He is status post a greenlight PVP on 06/01/2013.  His urinary symptoms improved significantly following the procedure.  Cystoscopy in September 2014 showed a widely patent prostatic urethra.  At his visit in 1/22, he noted some slight increase in his urinary symptoms with decreased dream, urgency, and nocturia.  He was not having any dysuria or gross hematuria. IPSS = 14.  PSA results: 11/18 1.37 11/19 1.80 12/20 0.93 1/22 1.48  He has history of erectile dysfunction.  He has previously used tadalafil 5 mg daily as well as generic sildenafil .  He reported some flushing with 60 mg of sildenafil .  He has most recently been using tadalafil 20 mg as needed.  He has been managed with tamsulosin  with fairly good control of his urinary symptoms.  He was given a trial of Myrbetriq in April 2023.  He was changed to oxybutynin  XL 10 mg daily due to cost of Myrbetriq.  He was referred to nephrology for chronic kidney disease.  CT imaging from 2023 showed mild renal atrophy without hydronephrosis.  Renal ultrasound in July 2023 showed bilateral renal calculi.  KUB from 7/23 showed no obvious stones.  He had an episode of urinary retention following laparoscopic cholecystectomy on  09/18/2023.  A Foley catheter was left in place and he was taking tamsulosin .  His catheter was removed after a voiding trial on 09/27/2023. He was seen in urgent care on 09/30/2023 with dysuria.  Urinalysis was unremarkable. He was voiding spontaneously after the catheter was removed.  The dysuria improved.  He continued with urgency and some hesitancy.  No gross hematuria or flank pain.  He continued on tamsulosin  and oxybutynin  XL. IPSS = 13. PVR = 90 ml.  At his visit in November 2024, he continued on tamsulosin .  He reported voiding with a good stream.  No dysuria.  His main complaint was nocturia 1-2 times with urgency, primarily at night. IPSS = 12. PVR = 0 ml He was given a trial of trospium  20 mg nightly.  At his visit in January 2025, he continued on tamsulosin .  He was taking the trospium  20 mg at night but had not seen any significant change in his symptoms.  He continued to have frequency, urgency, slowing of his stream. IPSS = 14 QOL = 4/6. PVR = 75 mL. The trospium  was discontinued and he was given samples of Gemtesa .  He was also changed from tamsulosin  to alfuzosin .  He returns today for follow-up.  He continues on alfuzosin  and Gemtesa .  He has noted some improvement in his frequency, urgency, and nocturia with the new combination.  No side effects.  No dysuria or gross hematuria. IPSS = 12 today.  Portions of the above documentation were copied from a prior  visit for review purposes only.   Past Medical History:  Past Medical History:  Diagnosis Date   Bronchitis    Diverticulosis    FH: BPH (benign prostatic hypertrophy)    History of Graves' disease 01/19/2018   S/p ablation   HTN (hypertension)    Hyperlipidemia    Obesity    Psoriatic arthritis (HCC)    Rheumatoid arthritis (HCC) 08/07/2014   Sleep apnea    CPAP   Thyroid  disease     Past Surgical History:  Past Surgical History:  Procedure Laterality Date   ABDOMINAL AORTOGRAM W/LOWER EXTREMITY N/A  01/14/2022   Procedure: ABDOMINAL AORTOGRAM W/LOWER EXTREMITY;  Surgeon: Wenona Hamilton, MD;  Location: [facility] INVASIVE [facility];  Service: Cardiovascular;  Laterality: N/A;   ATRIAL FIBRILLATION ABLATION N/A 12/24/2022   Procedure: ATRIAL FIBRILLATION ABLATION;  Surgeon: Efraim Grange, MD;  Location: [facility] INVASIVE CV [facility];  Service: Cardiovascular;  Laterality: N/A;   CHOLECYSTECTOMY N/A 09/18/2023   Procedure: LAPAROSCOPIC CHOLECYSTECTOMY WITH INTRAOPERATIVE CHOLANGIOGRAM;  Surgeon: Kinsinger, Alphonso Aschoff, MD;  Location: [facility] OR;  Service: General;  Laterality: N/A;   INGUINAL HERNIA REPAIR     bilateral   KNEE SURGERY     bilateral arthroscopic   l foot surgery Left 12/2020   PACEMAKER IMPLANT N/A 12/13/2019   Procedure: PACEMAKER IMPLANT;  Surgeon: Tammie Fall, MD;  Location: [facility];  Service: Cardiovascular;  Laterality: N/A;   TRANSURETHRAL RESECTION OF PROSTATE     UMBILICAL HERNIA REPAIR      Allergies:  Allergies  Allergen Reactions   Codeine     Tension, nasty feeling    Nsaids Other (See Comments)    Renal insufficiency   Prednisone Other (See Comments)    Unknown/ sometimes takes with lower dose   Statins Other (See Comments)    Joint pain    Sulfa Antibiotics Other (See Comments)    Joint pain    Family History:  Family History  Problem Relation Age of Onset   CAD Mother 4       CABG   CAD Father 31       Died age 50   Diabetes Brother    Lung cancer Maternal Grandmother    Prostate cancer Neg Hx    Colon cancer Neg Hx     Social History:  Social History   Tobacco Use   Smoking status: Former    Types: Cigarettes   Smokeless tobacco: Never   Tobacco comments:    Former smoker Quit 46 years ago. 01/21/23  Vaping Use   Vaping status: Never Used  Substance Use Topics   Alcohol use: No   Drug use: No    ROS: Constitutional:  Negative for fever, chills, weight loss CV: Negative for chest pain, previous MI, hypertension Respiratory:   Negative for shortness of breath, wheezing, sleep apnea, frequent cough GI:  Negative for nausea, vomiting, bloody stool, GERD  Physical exam: BP (!) 166/78   Pulse 87   Ht 5\' 10"  (1.778 m)   Wt 216 lb (98 kg)   BMI 30.99 kg/m  GENERAL APPEARANCE:  Well appearing, well developed, well nourished, NAD HEENT:  Atraumatic, normocephalic, oropharynx clear NECK:  Supple without lymphadenopathy or thyromegaly ABDOMEN:  Soft, non-tender, no masses EXTREMITIES:  Moves all extremities well, without clubbing, cyanosis, or edema NEUROLOGIC:  Alert and oriented x 3, normal gait, CN II-XII grossly intact MENTAL STATUS:  appropriate BACK:  Non-tender to palpation, No CVAT SKIN:  Warm,  dry, and intact   Results: U/A: 0 WBC, 0-2 RBC

## 2024-01-18 NOTE — Progress Notes (Signed)
Remote pacemaker transmission.

## 2024-01-18 NOTE — Addendum Note (Signed)
Addended by: Elease Etienne A on: 01/18/2024 03:54 PM   Modules accepted: Orders

## 2024-01-24 ENCOUNTER — Other Ambulatory Visit: Payer: Self-pay | Admitting: Physician Assistant

## 2024-01-24 DIAGNOSIS — N1832 Chronic kidney disease, stage 3b: Secondary | ICD-10-CM | POA: Diagnosis not present

## 2024-01-25 ENCOUNTER — Other Ambulatory Visit: Payer: Medicare Other

## 2024-01-25 ENCOUNTER — Ambulatory Visit (INDEPENDENT_AMBULATORY_CARE_PROVIDER_SITE_OTHER): Payer: Medicare Other | Admitting: Physician Assistant

## 2024-01-25 ENCOUNTER — Ambulatory Visit (INDEPENDENT_AMBULATORY_CARE_PROVIDER_SITE_OTHER)
Admission: RE | Admit: 2024-01-25 | Discharge: 2024-01-25 | Disposition: A | Discharge status: Home / Self Care | Payer: Medicare Other | Source: Ambulatory Visit | Attending: Physician Assistant | Admitting: Physician Assistant

## 2024-01-25 ENCOUNTER — Encounter: Payer: Self-pay | Admitting: Physician Assistant

## 2024-01-25 VITALS — BP 124/80 | HR 76 | Ht 70.0 in | Wt 220.0 lb

## 2024-01-25 DIAGNOSIS — R197 Diarrhea, unspecified: Secondary | ICD-10-CM

## 2024-01-25 DIAGNOSIS — K219 Gastro-esophageal reflux disease without esophagitis: Secondary | ICD-10-CM

## 2024-01-25 DIAGNOSIS — K760 Fatty (change of) liver, not elsewhere classified: Secondary | ICD-10-CM

## 2024-01-25 DIAGNOSIS — R7989 Other specified abnormal findings of blood chemistry: Secondary | ICD-10-CM

## 2024-01-25 DIAGNOSIS — K915 Postcholecystectomy syndrome: Secondary | ICD-10-CM

## 2024-01-25 DIAGNOSIS — D696 Thrombocytopenia, unspecified: Secondary | ICD-10-CM

## 2024-01-25 DIAGNOSIS — R945 Abnormal results of liver function studies: Secondary | ICD-10-CM

## 2024-01-25 DIAGNOSIS — R1011 Right upper quadrant pain: Secondary | ICD-10-CM | POA: Diagnosis not present

## 2024-01-25 MED ORDER — FAMOTIDINE 40 MG PO TABS
40.0000 mg | ORAL_TABLET | Freq: Every day | ORAL | 1 refills | Status: DC
Start: 2024-01-25 — End: 2024-08-08

## 2024-01-25 NOTE — Progress Notes (Addendum)
 01/25/2024 Martin Rubio [id number] 12/02/1946  Referring provider: Wanda Plump, MD Primary GI doctor: Dr. Barron Alvine  ASSESSMENT AND PLAN:   Abdominal Pain with GERD Intermittent, non-exertional, right upper quadrant pain. No associated nausea, vomiting, fever, or chills. No evidence of biliary dilatation or stones on recent ultrasound. Pain may be musculoskeletal in nature.  Positive Carnett on physical exam. -Trial of over-the-counter lidocaine patches for pain. -Consider further imaging (MRI) if pain worsens or labs are abnormal. - continue pantoprazole 40 mg, take 30 mins an hour before food, take pepcid at night, food choices discussed, weight loss discussed.   Fatty liver with elevated LFTs and thrombocytopenia Discussed the potential progression to cirrhosis, particularly in patients with metabolic syndrome. S/p cholecystectomy Recent RUQ without ductal dilation, CT abdomen pelvis 09/2023 normal spleen no evidence of portal hypertension FIB 4 = 3.01 possible advanced fibrosis -Will get INR -No evidence of hepatic encephalopathy -Order elastography to assess degree of fibrosis. -Check liver function tests. - need LFTs and CBC monitored every 6 months, revaluation every 2-3 years.  --Continue to work on risk factor modification including diet exercise and control of risk factors including blood sugars.  Diarrhea Post-Cholecystectomy Daily to every other day loose stools since gallbladder removal. Currently managed with as-needed Imodium and dicyclomine. -Add fiber supplement to diet. -Continue Imodium as needed. -Consider bile acid sequestrants if symptoms persist.  Afib Patient on Xarelto for unspecified indication. -Continue Xarelto as prescribed.  Proteinuria Noted by urologist. Patient has appointment with nephrologist tomorrow -Continue follow-up with nephrologist as planned.      Patient Care Team: Wanda Plump, MD as PCP - General (Internal  Medicine) Rollene Rotunda, MD as PCP - Cardiology (Cardiology) Mealor, Roberts Gaudy, MD as PCP - Electrophysiology (Cardiology) Windy Carina, PA-C (Inactive) as Physician Assistant (Rheumatology) Dorna Leitz, MD as Referring Physician (Gastroenterology) Donnetta Hail, MD as Consulting Physician (Rheumatology)  HISTORY OF PRESENT ILLNESS: 78 y.o. male with a past medical history of complete heart block status post pacemaker placement in 2021, A-fib status post ablation 12/24/2022 xarelto, PUD, Graves' disease, sleep apnea on CPAP, rheumatoid arthritis, psoriatic arthritis and others listed below presents for evaluation of RUQ pain.   12/02/2021 EGD at [facility], cannot see report. Same day colonoscopy. 09/14/2023 patient referred to our clinic for nausea and requested Dr. Barron Alvine.  Previous GI history with [facility].  09/16/2023-09/19/2023 patient admitted to the hospital for acute gallstone pancreatitis.  Patient initially had a CT with no evidence of cholecystitis, LFTs elevated initially but improved.  Seen by general surgery and underwent lap cholecystectomy with IOC with no evidence of choledocholithiasis.  Did well postoperatively and was cleared for discharge.  09/22/2023 office visit with PCP for generalized abdominal pain, had been on Oxycodone and had not had a bowel movement since before his surgery.  Then had trouble urinating.  Patient had a DRE with abundant stools.  Postop patient had urinary retention from fecal impaction and possibly early SBO.  Patient sent to the ER.    09/22/2023 ER visit with platelets of 134, creatinine 1.54.  Bladder scan revealed 400 cc of urine.  Patient had a In-N-Out catheter placed.  Patient started having bowel movements.  Abdominal pain resolved.    09/27/2023 patient seen by urology who did a bladder catheterization for benign prostatic hyperplasia with urinary retention.  Foley was then removed.  Patient told to take  Flomax. 09/28/2023 office visit with Hyacinth Meeker for reflux and to establish  care, continued on pantoprazole 40 mg daily, Pepcid at night and as needed. 01/13/2024 RUQ [place]  for abdominal pain postcholecystectomy showed increased hepatic echotexture most likely steatosis surgically absent gallbladder no biliary dilation.  Today patient presents for Ab pain.   Discussed the use of AI scribe software for clinical note transcription with the patient, who gave verbal consent to proceed.  History of Present Illness   The patient, with a history of fatty liver, psoriatic arthritis, and gallbladder removal, presents with multiple concerns. He reports intermittent abdominal pain, which he initially thought was muscular but now believes is related to his diet, particularly after consuming fatty foods. The pain is described as a nagging sensation located under the rib.   The patient also mentions a history of alcohol consumption but has been sober since 1975.  Patient denies family history of liver function issues.  No history of drug use. Patient denies very dark urine, denies abdominal swelling or leg swelling, denies any confusion.  Denies any yellowing of eyes or skin. The patient also reports having protein in his urine, as diagnosed by Dr. Pete Glatter, a urologist. He denies being diabetic and is scheduled to see a nephrologist for further evaluation of this issue.  The patient has been diagnosed with psoriatic arthritis and expresses concern about the potential for this condition to affect other organs in his body.  Post-gallbladder removal, the patient has been experiencing gastrointestinal issues, specifically diarrhea. He reports having loose stools every other day, which he manages with dicyclomine and over-the-counter antidiarrheal medication as needed. He also mentions that he has tried adding fiber to his diet to help manage this issue.  The patient is currently on blood thinners (Xarelto) for  an unspecified condition. He also mentions taking iron supplements, which he believes may have affected the color of his stools.      He  reports that he has quit smoking. His smoking use included cigarettes. He has never used smokeless tobacco. He reports that he does not drink alcohol and does not use drugs.  Was in the [employer] so has history of heavy drinking but he stopped drinking in 1980's  RELEVANT GI HISTORY, LABS, IMAGING: 06/27/2018 GI consult for dysphagia.  At that time recommended an EGD and colonoscopy for screening.   06/14/2018 EGD with GE junction and Z-line at 40 cm and otherwise normal.  Biopsy showed fragment of unremarkable esophageal mucosa in the distal and proximal esophagus.   09/01/2021 patient presented for history of IDA.  At that time recommended repeat EGD and colonoscopy.   03/25/22 capsule endoscopy showed no lesions or bleeding  CBC    Component Value Date/Time   WBC 6.0 09/22/2023 1046   RBC 5.57 09/22/2023 1046   HGB 15.7 09/22/2023 1046   HGB 16.9 02/25/2023 1057   HCT 47.2 09/22/2023 1046   HCT 51.7 (H) 02/25/2023 1057   PLT 134 (L) 09/22/2023 1046   PLT 184 02/25/2023 1057   MCV 84.7 09/22/2023 1046   MCV 84 02/25/2023 1057   MCH 28.2 09/22/2023 1046   MCHC 33.3 09/22/2023 1046   RDW 13.5 09/22/2023 1046   RDW 13.9 02/25/2023 1057   LYMPHSABS 1.1 09/22/2023 1046   LYMPHSABS 1.9 12/18/2022 0909   MONOABS 0.7 09/22/2023 1046   EOSABS 0.5 09/22/2023 1046   EOSABS 0.2 12/18/2022 0909   BASOSABS 0.1 09/22/2023 1046   BASOSABS 0.0 12/18/2022 0909   Recent Labs    02/25/23 1057 06/11/23 1151 09/14/23 0000 09/16/23 1857 09/17/23 0454  09/18/23 0327 09/22/23 1046  HGB 16.9 15.4 16.2 16.2 15.9 15.0 15.7    CMP     Component Value Date/Time   NA 137 09/22/2023 1046   NA 144 09/14/2023 0000   K 4.0 09/22/2023 1046   CL 101 09/22/2023 1046   CO2 24 09/22/2023 1046   GLUCOSE 97 09/22/2023 1046   BUN 18 09/22/2023 1046   BUN 18 09/14/2023  0000   CREATININE 1.54 (H) 09/22/2023 1046   CREATININE 1.02 01/22/2017 1503   CALCIUM 9.0 09/22/2023 1046   PROT 6.7 10/20/2023 1403   ALBUMIN 3.8 09/22/2023 1046   ALBUMIN 4.1 11/01/2017 0939   AST 31 09/22/2023 1046   ALT 37 09/22/2023 1046   ALKPHOS 70 09/22/2023 1046   BILITOT 0.8 09/22/2023 1046   BILITOT 0.5 11/01/2017 0939   GFRNONAA 46 (L) 09/22/2023 1046   GFRAA 56 (L) 10/18/2020 1141      Latest Ref Rng & Units 10/20/2023    2:03 PM 09/22/2023   10:46 AM 09/18/2023    3:27 AM  Hepatic Function  Total Protein 6.0 - 8.5 g/dL 6.7  6.6  5.9   Albumin 3.5 - 5.0 g/dL  3.8  3.3   AST 15 - 41 U/L  31  44   ALT 0 - 44 U/L  37  72   Alk Phosphatase 38 - 126 U/L  70  77   Total Bilirubin 0.3 - 1.2 mg/dL  0.8  1.1       Current Medications:   Current Outpatient Medications (Endocrine & Metabolic):    levothyroxine (SYNTHROID) 137 MCG tablet, Take 1 tablet (137 mcg total) by mouth daily before breakfast.  Current Outpatient Medications (Cardiovascular):    amLODipine (NORVASC) 5 MG tablet, Take 1 tablet (5 mg total) by mouth daily.   dofetilide (TIKOSYN) 250 MCG capsule, Take 1 capsule (250 mcg total) by mouth 2 (two) times daily.   Evolocumab (REPATHA SURECLICK) 140 MG/ML SOAJ, Inject 140 mg into the skin every 14 (fourteen) days.   losartan (COZAAR) 50 MG tablet, TAKE ONE TABLET BY MOUTH EVERY MORNING AND TAKE ONE TABLET BY MOUTH AT BEDTIME   tadalafil (CIALIS) 20 MG tablet, Take 20 mg by mouth daily as needed for erectile dysfunction.  Current Outpatient Medications (Respiratory):    fluticasone (FLONASE) 50 MCG/ACT nasal spray, Place 1 spray into both nostrils as needed for allergies.   Current Outpatient Medications (Hematological):    Cyanocobalamin (VITAMIN B 12) 500 MCG TABS, Take 1,000 mcg by mouth daily in the afternoon.   ferrous fumarate (FERRETTS) 325 (106 Fe) MG TABS tablet, Take 1 tablet (106 mg of iron total) by mouth 2 (two) times daily. (Patient taking  differently: Take 1 tablet by mouth daily.)   rivaroxaban (XARELTO) 20 MG TABS tablet, Take 1 tablet (20 mg total) by mouth daily with supper.  Current Outpatient Medications (Other):    alfuzosin (UROXATRAL) 10 MG 24 hr tablet, Take 1 tablet (10 mg total) by mouth daily with breakfast.   cholecalciferol (VITAMIN D3) 25 MCG (1000 UNIT) tablet, Take 2,000 Units by mouth daily in the afternoon.   diclofenac Sodium (VOLTAREN) 1 % GEL, Apply 4 g topically in the morning and at bedtime.   dicyclomine (BENTYL) 20 MG tablet, TAKE ONE TABLET BY MOUTH EVERY 6 HOURS AS NEEDED FOR ABDOMINAL CRAMPING/DIARRHEA   famotidine (PEPCID) 20 MG tablet, Take 1 tablet (20 mg total) by mouth at bedtime.   hydrOXYzine (VISTARIL) 25 MG capsule, Take 1 capsule (  25 mg total) by mouth every 8 (eight) hours as needed.   meclizine (ANTIVERT) 25 MG tablet, Take 25 mg by mouth 3 (three) times daily as needed for dizziness.   Melatonin 5 MG TABS, Take 5 mg by mouth at bedtime.   OVER THE COUNTER MEDICATION, Take 2 capsules by mouth daily in the afternoon. Nervprin - Healthy Nerve Support - Peripheral Neuropathy Relief   pantoprazole (PROTONIX) 40 MG tablet, Take 1 tablet (40 mg total) by mouth daily.   pregabalin (LYRICA) 50 MG capsule, Take 50 mg by mouth at bedtime as needed.   triamcinolone cream (KENALOG) 0.1 %, Apply 1 application  topically daily as needed for itching.   Vibegron (GEMTESA) 75 MG TABS, Take 1 tablet (75 mg total) by mouth daily.   lamoTRIgine (LAMICTAL) 25 MG tablet, Take one pill daily x 1 week, then one pill twice daily x 1 week, then one pill three times daily x 1 week, then 2 pills twice daily (Patient not taking: Reported on 01/25/2024)   methocarbamol (ROBAXIN) 500 MG tablet, Take 1 tablet (500 mg total) by mouth every 8 (eight) hours as needed for muscle spasms. (Patient not taking: Reported on 01/17/2024)  Medical History:  Past Medical History:  Diagnosis Date   Bronchitis    Diverticulosis     FH: BPH (benign prostatic hypertrophy)    History of Graves' disease 01/19/2018   S/p ablation   HTN (hypertension)    Hyperlipidemia    Obesity    Psoriatic arthritis (HCC)    Rheumatoid arthritis (HCC) 08/07/2014   Sleep apnea    CPAP   Thyroid disease    Allergies:  Allergies  Allergen Reactions   Codeine     Tension, nasty feeling    Nsaids Other (See Comments)    Renal insufficiency   Prednisone Other (See Comments)    Unknown/ sometimes takes with lower dose   Statins Other (See Comments)    Joint pain    Sulfa Antibiotics Other (See Comments)    Joint pain     Surgical History:  He  has a past surgical history that includes Knee surgery; Inguinal hernia repair; Umbilical hernia repair; Transurethral resection of prostate; PACEMAKER IMPLANT (N/A, 12/13/2019); l foot surgery (Left, 12/2020); ABDOMINAL AORTOGRAM W/LOWER EXTREMITY (N/A, 01/14/2022); ATRIAL FIBRILLATION ABLATION (N/A, 12/24/2022); and Cholecystectomy (N/A, 09/18/2023). Family History:  His family history includes CAD (age of onset: 82) in his mother; CAD (age of onset: 67) in his father; Diabetes in his brother; Lung cancer in his maternal grandmother.  REVIEW OF SYSTEMS  : All other systems reviewed and negative except where noted in the History of Present Illness.  PHYSICAL EXAM: BP 124/80   Pulse 76   Ht 5\' 10"  (1.778 m)   Wt 220 lb (99.8 kg)   BMI 31.57 kg/m  General Appearance: Well nourished, in no apparent distress. Head:   Normocephalic and atraumatic. Eyes:  sclerae anicteric,conjunctive pink  Respiratory: Respiratory effort normal, BS equal bilaterally without rales, rhonchi, wheezing. Cardio: RRR with no MRGs. Peripheral pulses intact.  Abdomen: Soft,  Obese ,active bowel sounds. mild tenderness in the RUQ with positive carnettWithout guarding and Without rebound. No masses. Rectal: Not evaluated Musculoskeletal: Full ROM, Normal gait. With edema. Skin:  Dry and intact without significant  lesions or rashes Neuro: Alert and  oriented x4;  No focal deficits. Psych:  Cooperative. Normal mood and affect.     Doree Albee, PA-C 12:38 PM

## 2024-01-25 NOTE — Addendum Note (Signed)
 Addended by: Quentin Mulling on: 01/25/2024 01:35 PM   Modules accepted: Orders

## 2024-01-25 NOTE — Patient Instructions (Addendum)
 Your provider has requested that you have an abdominal x ray before leaving today. Please go to the basement floor to our Radiology department for the test.  Your provider has requested that you go to the basement level for lab work before leaving today. Press "B" on the elevator. The lab is located at the first door on the left as you exit the elevator.  You have been scheduled for an abdominal ultrasound with elastography at [facility] (1st floor). Your appointment is scheduled for 02/01/24 at 7:30 am. Please arrive 30 minutes prior to your scheduled appointment for registration purposes. Make certain not to have anything to eat or drink 6 hours prior to your procedure. Should you need to reschedule your appointment, you may contact radiology at [phone].  Liver Elastography Various chronic liver diseases such as hepatitis B, C, and fatty liver disease can lead to tissue damage and subsequent scar tissue formation. As the scar tissue accumulates, the liver loses some of its elasticity and becomes stiffer. Liver elastography involves the use of a surface ultrasound probe that delivers a low frequency pulse or shear wave to a small volume of liver tissue under the rib cage. The transmission of the sound wave is completely painless. How Is a Liver Elastography Performed? The liver is located in the right upper abdomen under the rib cage. Patients are asked to lie flat on an examination table. A technician places the FibroScan probe between the ribs on the right side of the lower chest wall. A series of 10 painless pulses are then applied to the liver. The results are recorded on the equipment and an overall liver stiffness score is generated. This score is then interpreted by a qualified physician to predict the likelihood of advanced fibrosis or cirrhosis.  Patients are asked to wear loose clothing and should not consume any liquids or solids for a minimum of 4 hours before the test  to increase the likelihood of obtaining reliable test results. The scan will take 10 to 15 minutes to complete, but patients should plan on being available for 30 minutes to allow time for preparation   No aleve, ibuprofen, goody powders, as these are antiinflammatories and can cause inflammation in your stomach, increase bleeding risk and cause ulcers.  You can talk with PCP about alternative pain options.  Can do tyelnol max 3000 mg a day, salon pas patches are over the counter and voltern gel is topical antiinflammatory that is safe.   FIBER SUPPLEMENT You can do metamucil or fibercon once or twice a day but if this causes gas/bloating please switch to Benefiber or Citracel.  Fiber is good for constipation/diarrhea/irritable bowel syndrome.  It can also help with weight loss and can help lower your bad cholesterol (LDL).  Please do 1 TBSP in the morning in water, coffee, or tea.  It can take up to a month before you can see a difference with your bowel movements.  It is cheapest from [employer], [employer], [employer].    Metabolic dysfunction associated seatohepatitis  Now the leading cause of liver failure in the [place].  It is normally from such risk factors as obesity, diabetes, insulin resistance, high cholesterol, or metabolic syndrome.  The only definitive therapy is weight loss and exercise.   Suggest walking 20-30 mins daily.  Decreasing carbohydrates, increasing veggies.    Fatty Liver Fatty liver is the accumulation of fat in liver cells. It is also called hepatosteatosis or steatohepatitis. It is normal for your  liver to contain some fat. If fat is more than 5 to 10% of your liver's weight, you have fatty liver.  There are often no symptoms (problems) for years while damage is still occurring. People often learn about their fatty liver when they have medical tests for other reasons. Fat can damage your liver for years or even decades without causing problems. When it becomes  severe, it can cause fatigue, weight loss, weakness, and confusion. This makes you more likely to develop more serious liver problems. The liver is the largest organ in the body. It does a lot of work and often gives no warning signs when it is sick until late in a disease. The liver has many important jobs including: Breaking down foods. Storing vitamins, iron, and other minerals. Making proteins. Making bile for food digestion. Breaking down many products including medications, alcohol and some poisons.  PROGNOSIS  Fatty liver may cause no damage or it can lead to an inflammation of the liver. This is, called steatohepatitis.  Over time the liver may become scarred and hardened. This condition is called cirrhosis. Cirrhosis is serious and may lead to liver failure or cancer. NASH is one of the leading causes of cirrhosis. About 10-20% of Americans have fatty liver and a smaller 2-5% has NASH.  TREATMENT  Weight loss, fat restriction, and exercise in overweight patients produces inconsistent results but is worth trying. Good control of diabetes may reduce fatty liver. Eat a balanced, healthy diet. Increase your physical activity. There are no medical or surgical treatments for a fatty liver or NASH, but improving your diet and increasing your exercise may help prevent or reverse some of the damage.     FODMAP stands for fermentable oligo-, di-, mono-saccharides and polyols (1). These are the scientific terms used to classify groups of carbs that are difficult for our body to digest and that are notorious for triggering digestive symptoms like bloating, gas, loose stools and stomach pain.   You can try low FODMAP diet  - start with eliminating just one column at a time that you feel may be a trigger for you. - the table at the very bottom contains foods that are low in FODMAPs   Sometimes trying to eliminate the FODMAP's from your diet is difficult or tricky, if you are stuggling with  trying to do the elimination diet you can try an enzyme.  There is a food enzymes that you sprinkle in or on your food that helps break down the FODMAP. You can read more about the enzyme by going to this site: https://fodzyme.com/

## 2024-01-26 DIAGNOSIS — N1832 Chronic kidney disease, stage 3b: Secondary | ICD-10-CM | POA: Diagnosis not present

## 2024-01-26 DIAGNOSIS — I129 Hypertensive chronic kidney disease with stage 1 through stage 4 chronic kidney disease, or unspecified chronic kidney disease: Secondary | ICD-10-CM | POA: Diagnosis not present

## 2024-01-27 ENCOUNTER — Other Ambulatory Visit: Payer: Medicare Other

## 2024-01-27 DIAGNOSIS — R7989 Other specified abnormal findings of blood chemistry: Secondary | ICD-10-CM | POA: Diagnosis not present

## 2024-01-27 DIAGNOSIS — K760 Fatty (change of) liver, not elsewhere classified: Secondary | ICD-10-CM | POA: Diagnosis not present

## 2024-01-28 LAB — HEPATITIS B SURFACE ANTIBODY,QUALITATIVE: Hep B S Ab: NONREACTIVE

## 2024-01-31 ENCOUNTER — Other Ambulatory Visit: Payer: Self-pay | Admitting: *Deleted

## 2024-02-01 ENCOUNTER — Ambulatory Visit (HOSPITAL_COMMUNITY)
Admission: RE | Admit: 2024-02-01 | Discharge: 2024-02-01 | Disposition: A | Discharge status: Home / Self Care | Payer: Medicare Other | Source: Ambulatory Visit | Attending: Physician Assistant | Admitting: Physician Assistant

## 2024-02-01 DIAGNOSIS — R7989 Other specified abnormal findings of blood chemistry: Secondary | ICD-10-CM | POA: Insufficient documentation

## 2024-02-01 DIAGNOSIS — K760 Fatty (change of) liver, not elsewhere classified: Secondary | ICD-10-CM | POA: Insufficient documentation

## 2024-02-01 DIAGNOSIS — R945 Abnormal results of liver function studies: Secondary | ICD-10-CM | POA: Diagnosis not present

## 2024-02-01 LAB — HEPATITIS B SURFACE ANTIGEN: Hepatitis B Surface Ag: NONREACTIVE

## 2024-02-01 LAB — HEPATITIS B SURFACE ANTIBODY,QUALITATIVE: Hep B S Ab: NONREACTIVE

## 2024-02-01 LAB — ANA: Anti Nuclear Antibody (ANA): NEGATIVE

## 2024-02-01 LAB — ANTI-SMOOTH MUSCLE ANTIBODY, IGG: Actin (Smooth Muscle) Antibody (IGG): <20 U (ref <20)

## 2024-02-01 LAB — IGG: IgG (Immunoglobin G), Serum: 742 mg/dL (ref 600–1540)

## 2024-02-01 LAB — HEPATITIS A ANTIBODY, TOTAL: Hepatitis A AB,Total: NONREACTIVE

## 2024-02-01 LAB — ANTI-MICROSOMAL ANTIBODY LIVER / KIDNEY: LKM1 Ab: <=20 U (ref <=20.0)

## 2024-02-01 LAB — IGA: Immunoglobulin A: 225 mg/dL (ref 70–320)

## 2024-02-01 LAB — HEPATITIS C ANTIBODY: Hepatitis C Ab: NONREACTIVE

## 2024-02-01 LAB — TISSUE TRANSGLUTAMINASE, IGA: (tTG) Ab, IgA: <1 U/mL

## 2024-02-01 LAB — MITOCHONDRIAL ANTIBODIES: Mitochondrial M2 Ab, IgG: <=20 U (ref <=20.0)

## 2024-02-04 ENCOUNTER — Ambulatory Visit (INDEPENDENT_AMBULATORY_CARE_PROVIDER_SITE_OTHER): Payer: Medicare Other | Admitting: Gastroenterology

## 2024-02-04 ENCOUNTER — Encounter: Payer: Self-pay | Admitting: Gastroenterology

## 2024-02-04 VITALS — BP 130/72 | HR 87 | Ht 70.0 in | Wt 219.0 lb

## 2024-02-04 DIAGNOSIS — K760 Fatty (change of) liver, not elsewhere classified: Secondary | ICD-10-CM | POA: Diagnosis not present

## 2024-02-04 DIAGNOSIS — D696 Thrombocytopenia, unspecified: Secondary | ICD-10-CM

## 2024-02-04 DIAGNOSIS — R197 Diarrhea, unspecified: Secondary | ICD-10-CM

## 2024-02-04 DIAGNOSIS — R1011 Right upper quadrant pain: Secondary | ICD-10-CM

## 2024-02-04 DIAGNOSIS — K9089 Other intestinal malabsorption: Secondary | ICD-10-CM

## 2024-02-04 DIAGNOSIS — K219 Gastro-esophageal reflux disease without esophagitis: Secondary | ICD-10-CM | POA: Diagnosis not present

## 2024-02-04 NOTE — Patient Instructions (Signed)
 _______________________________________________________  If your blood pressure at your visit was 140/90 or greater, please contact your primary care physician to follow up on this. _______________________________________________________  If you are age 80 or older, your body mass index should be between 23-30. Your Body mass index is 31.42 kg/m. If this is out of the aforementioned range listed, please consider follow up with your Primary Care Provider. ________________________________________________________  The [facility] providers would like to encourage you to use [facility] to communicate with providers for non-urgent requests or questions.  Due to long hold times on the telephone, sending your provider a message by [facility] may be a faster and more efficient way to get a response.  Please allow 48 business hours for a response.  Please remember that this is for non-urgent requests.  _______________________________________________________  It was a pleasure to see you today!  Vito Cirigliano, D.O.

## 2024-02-04 NOTE — Progress Notes (Signed)
 Chief Complaint:    RUQ pain, GERD  GI History: 79 y.o. male with a past medical history of complete heart block s/p pacemaker placement in 2021, A-fib s/p ablation 12/24/2022 (on xarelto), HTN, HLD, PUD, Graves' disease, OSA (on CPAP), rheumatoid arthritis, psoriatic arthritis, BPH with LUTS, CKD 3, who follows in the GI clinic for RUQ pain and GERD.  Previous followed at [facility]. - 06/24/2018: EGD: Normal esophagus with benign esophageal biopsies - 09/01/2021: Evaluation for IDA and recommended EGD and colonoscopy.  Reports not available for review - 03/25/2022: VCE: Normal  - 09/16/2023-09/19/2023 patient admitted to the hospital for acute gallstone pancreatitis.  Patient initially had a CT with no evidence of cholecystitis, LFTs elevated initially but improved.  Seen by general surgery and underwent lap cholecystectomy with IOC with no evidence of choledocholithiasis.  Did well postoperatively and was cleared for discharge. - 09/22/2023 office visit with PCP for generalized abdominal pain, had been on Oxycodone and had not had a bowel movement since before his surgery.  Then had trouble urinating.  Patient had a DRE with abundant stools.  Postop patient had urinary retention from fecal impaction and possibly early SBO.  Patient sent to the ER. - 09/22/2023 ER visit with platelets of 134, creatinine 1.54.  Bladder scan revealed 400 cc of urine.  Patient had a In-N-Out catheter placed.  Patient started having bowel movements.  Abdominal pain resolved. - 09/27/2023 patient seen by urology who did a bladder catheterization for benign prostatic hyperplasia with urinary retention.  Foley was then removed.  Patient told to take Flomax. - 09/28/2023 office visit with Hyacinth Meeker, PA-C for reflux and to establish care, continued on pantoprazole 40 mg daily, Pepcid at night and as needed. - 01/13/2024 RUQ [place]  for abdominal pain postcholecystectomy showed increased hepatic echotexture most  likely steatosis surgically absent gallbladder no biliary dilation. -01/25/2024 follow-up visit with Quentin Mulling, PA-C.  Had been having diarrhea since his cholecystectomy, treating with OTC antidiarrheal medications, fiber supplement, and dicyclomine.  Was also having RUQ pain and GERD.  Recommended OTC lidocaine patches for pain, continued pantoprazole, added Pepcid at nighttime  HPI:     Patient is a 78 y.o. male presenting to the Gastroenterology Clinic for follow-up.   Underwent ultrasound elastography on 02/01/2024.  Report not yet available for review.  Reviewed labs from earlier this month: - Normal AMA, ANA, ASMA, anti-LKM, IgG, IgA, TTG - HCV-, HBsAb-, HBsAg-, HAV Ab-; referred for hepatitis A/B vaccine - PLT 136, otherwise normal CBC - BUN/creatinine 16/1.68  Main issue today is continued RUQ pain.  RUQ pain located under right rib margin and started after cholecystectomy in October.  No associated fever, chills, nausea/vomiting.  Can sometimes be associated with p.o. intake, but not always.  Pain not reproducible with position or palpation.  Separately, has been having intermittent reflux symptoms.  Added Pepcid 40 mg at bedtime at last appointment with overall improvement, and has also found improvement with ginger candies prn.  Taking Protonix 40 mg daily.   Review of systems:     No chest pain, no SOB, no fevers, no urinary sx   Past Medical History:  Diagnosis Date   Bronchitis    Diverticulosis    FH: BPH (benign prostatic hypertrophy)    History of Graves' disease 01/19/2018   S/p ablation   HTN (hypertension)    Hyperlipidemia    Obesity    Psoriatic arthritis (HCC)    Rheumatoid arthritis (HCC) 08/07/2014  Sleep apnea    CPAP   Thyroid disease     Patient's surgical history, family medical history, social history, medications and allergies were all reviewed in Epic    Current Outpatient Medications  Medication Sig Dispense Refill   alfuzosin  (UROXATRAL) 10 MG 24 hr tablet Take 1 tablet (10 mg total) by mouth daily with breakfast. 30 tablet 11   amLODipine (NORVASC) 5 MG tablet Take 1 tablet (5 mg total) by mouth daily. 90 tablet 3   cholecalciferol (VITAMIN D3) 25 MCG (1000 UNIT) tablet Take 2,000 Units by mouth daily in the afternoon.     Cyanocobalamin (VITAMIN B 12) 500 MCG TABS Take 1,000 mcg by mouth daily in the afternoon.     diclofenac Sodium (VOLTAREN) 1 % GEL Apply 4 g topically in the morning and at bedtime. 100 g 6   dicyclomine (BENTYL) 20 MG tablet TAKE ONE TABLET BY MOUTH EVERY 6 HOURS AS NEEDED FOR ABDOMINAL CRAMPING/DIARRHEA 30 tablet 0   dofetilide (TIKOSYN) 250 MCG capsule Take 1 capsule (250 mcg total) by mouth 2 (two) times daily. 180 capsule 1   Evolocumab (REPATHA SURECLICK) 140 MG/ML SOAJ Inject 140 mg into the skin every 14 (fourteen) days. 2 mL 11   famotidine (PEPCID) 40 MG tablet Take 1 tablet (40 mg total) by mouth at bedtime. 90 tablet 1   ferrous fumarate (FERRETTS) 325 (106 Fe) MG TABS tablet Take 1 tablet (106 mg of iron total) by mouth 2 (two) times daily. (Patient taking differently: Take 1 tablet by mouth daily.) 180 tablet 1   fluticasone (FLONASE) 50 MCG/ACT nasal spray Place 1 spray into both nostrils as needed for allergies.     hydrOXYzine (VISTARIL) 25 MG capsule Take 1 capsule (25 mg total) by mouth every 8 (eight) hours as needed. 60 capsule 3   lamoTRIgine (LAMICTAL) 25 MG tablet Take one pill daily x 1 week, then one pill twice daily x 1 week, then one pill three times daily x 1 week, then 2 pills twice daily (Patient not taking: Reported on 01/25/2024) 120 tablet 5   levothyroxine (SYNTHROID) 137 MCG tablet Take 1 tablet (137 mcg total) by mouth daily before breakfast. 90 tablet 1   losartan (COZAAR) 50 MG tablet TAKE ONE TABLET BY MOUTH EVERY MORNING AND TAKE ONE TABLET BY MOUTH AT BEDTIME 180 tablet 2   meclizine (ANTIVERT) 25 MG tablet Take 25 mg by mouth 3 (three) times daily as needed for  dizziness.     Melatonin 5 MG TABS Take 5 mg by mouth at bedtime.     methocarbamol (ROBAXIN) 500 MG tablet Take 1 tablet (500 mg total) by mouth every 8 (eight) hours as needed for muscle spasms. (Patient not taking: Reported on 01/17/2024) 30 tablet 1   OVER THE COUNTER MEDICATION Take 2 capsules by mouth daily in the afternoon. Nervprin - Healthy Nerve Support - Peripheral Neuropathy Relief     pantoprazole (PROTONIX) 40 MG tablet Take 1 tablet (40 mg total) by mouth daily. 90 tablet 1   pregabalin (LYRICA) 50 MG capsule Take 50 mg by mouth at bedtime as needed.     rivaroxaban (XARELTO) 20 MG TABS tablet Take 1 tablet (20 mg total) by mouth daily with supper. 90 tablet 1   tadalafil (CIALIS) 20 MG tablet Take 20 mg by mouth daily as needed for erectile dysfunction.     triamcinolone cream (KENALOG) 0.1 % Apply 1 application  topically daily as needed for itching.  Vibegron (GEMTESA) 75 MG TABS Take 1 tablet (75 mg total) by mouth daily. 30 tablet 11   No current facility-administered medications for this visit.    Physical Exam:     There were no vitals taken for this visit.  GENERAL:  Pleasant male in NAD PSYCH: : Cooperative, normal affect ABDOMEN:  Nondistended, soft, nontender. No obvious masses, no hepatomegaly,  normal bowel sounds.  Well-healed scars from previous lap cholecystectomy.  Large ventral hernia without tenderness.  Negative Carnett's sign (no palpable location of pain on exam today) SKIN:  turgor, no lesions seen Musculoskeletal:  Normal muscle tone, normal strength NEURO: Alert and oriented x 3, no focal neurologic deficits   IMPRESSION and PLAN:    1) GERD - Continue Protonix and Pepcid - Continue ginger candy on demand as that seems to help with any breakthrough reflux symptoms - Continue antireflux lifestyle/dietary modifications - Offered EGD to evaluate for erosive esophagitis, Barrett's Esophagus screening, etc. and patient politely declines  2) RUQ  pain Pain in the RUQ just below the costal margin.  Symptoms are intermittent, possibly more likely after eating, but not always.  No pain on exam today and does not seem to have a positional component to it.  Discussed possibility of abdominal wall syndrome, but again without focus of pain today, not sure how effective lidocaine/Kenalog injection would be.  Recent already acute ultrasound unrevealing. - Offered EGD to evaluate for mucosal/luminal pathology; he politely declines - Patient prefers ongoing monitoring and conservative management - If pain still present at follow-up, will again discuss role/utility of EGD along with consideration for cross-sectional imaging and/or follow-up with his surgeon   3) Bile salt diarrhea Loose stools starting after cholecystectomy in 09/2023.  Responsive to OTC antidiarrheals and fiber supplement - If symptoms recur, plan for cholestyramine  4) Hepatic steatosis Fatty liver infiltration noted on recent ultrasound.  Extended serologic workup otherwise unrevealing earlier this month.  Does have mild thrombocytopenia, but otherwise most recent set of liver enzymes with normal albumin, AST/ALT, ALP, T. bili. - Check repeat hepatic panel and PT/INR at time of next lab check - Close follow-up with his PCP and Cardiologist for continued aggressive monitoring/treatment of underlying comorbidities  RTC in 3 months or sooner as needed     I spent 35 minutes of time, including in depth chart review, independent review of results as outlined above, communicating results with the patient directly, face-to-face time with the patient, coordinating care, and ordering studies and medications as appropriate, and documentation.       Shellia Cleverly ,DO, FACG 02/04/2024, 9:36 AM

## 2024-02-07 DIAGNOSIS — H02839 Dermatochalasis of unspecified eye, unspecified eyelid: Secondary | ICD-10-CM | POA: Diagnosis not present

## 2024-02-07 DIAGNOSIS — H5202 Hypermetropia, left eye: Secondary | ICD-10-CM | POA: Diagnosis not present

## 2024-02-07 DIAGNOSIS — Z961 Presence of intraocular lens: Secondary | ICD-10-CM | POA: Diagnosis not present

## 2024-02-07 DIAGNOSIS — H5211 Myopia, right eye: Secondary | ICD-10-CM | POA: Diagnosis not present

## 2024-02-07 DIAGNOSIS — H52223 Regular astigmatism, bilateral: Secondary | ICD-10-CM | POA: Diagnosis not present

## 2024-02-07 DIAGNOSIS — H524 Presbyopia: Secondary | ICD-10-CM | POA: Diagnosis not present

## 2024-02-11 ENCOUNTER — Other Ambulatory Visit: Payer: Self-pay | Admitting: Physician Assistant

## 2024-02-11 ENCOUNTER — Ambulatory Visit: Payer: Medicare Other

## 2024-02-11 DIAGNOSIS — Z23 Encounter for immunization: Secondary | ICD-10-CM

## 2024-02-15 ENCOUNTER — Ambulatory Visit: Payer: Medicare Other | Admitting: Gastroenterology

## 2024-02-28 ENCOUNTER — Ambulatory Visit (INDEPENDENT_AMBULATORY_CARE_PROVIDER_SITE_OTHER): Payer: Medicare Other | Admitting: Urology

## 2024-02-28 ENCOUNTER — Encounter: Payer: Self-pay | Admitting: Urology

## 2024-02-28 VITALS — BP 114/72 | HR 77 | Ht 70.0 in | Wt 216.0 lb

## 2024-02-28 DIAGNOSIS — Z87898 Personal history of other specified conditions: Secondary | ICD-10-CM

## 2024-02-28 DIAGNOSIS — N529 Male erectile dysfunction, unspecified: Secondary | ICD-10-CM

## 2024-02-28 DIAGNOSIS — N401 Enlarged prostate with lower urinary tract symptoms: Secondary | ICD-10-CM | POA: Diagnosis not present

## 2024-02-28 DIAGNOSIS — N138 Other obstructive and reflux uropathy: Secondary | ICD-10-CM

## 2024-02-28 MED ORDER — VARDENAFIL HCL 20 MG PO TABS: 20.0000 mg | ORAL_TABLET | Freq: Every day | ORAL | 11 refills | Status: AC | PRN

## 2024-02-28 NOTE — Addendum Note (Signed)
 Addended by: Lizbeth Bark on: 02/28/2024 01:05 PM   Modules accepted: Orders

## 2024-02-28 NOTE — Progress Notes (Signed)
 Assessment: 1. BPH with obstruction/lower urinary tract symptoms   2. History of urinary retention   3. Organic impotence     Plan: Continue alfuzosin 10 mg daily  Trial of vardenafil 20 gm prn.  Rx provided Return to office in 3 months  Chief Complaint:  Chief Complaint  Patient presents with   Benign Prostatic Hypertrophy    History of Present Illness:  Martin Rubio is a 78 y.o. male who is seen for continued evaluation of BPH with obstruction, lower urinary tract symptoms, and history of urinary retention. He was previously followed at [facility] in [facility].  His last visit with me there was in January 2022.  Urologic History: History of BPH with obstruction.  He is status post a greenlight PVP on 06/01/2013.  His urinary symptoms improved significantly following the procedure.  Cystoscopy in September 2014 showed a widely patent prostatic urethra.  At his visit in 1/22, he noted some slight increase in his urinary symptoms with decreased dream, urgency, and nocturia.  He was not having any dysuria or gross hematuria. IPSS = 14.  PSA results: 11/18 1.37 11/19 1.80 12/20 0.93 1/22 1.48  He has history of erectile dysfunction.  He has previously used tadalafil 5 mg daily as well as generic sildenafil.  He reported some flushing with 60 mg of sildenafil.  He has most recently been using tadalafil 20 mg as needed.  He has been managed with tamsulosin with fairly good control of his urinary symptoms.  He was given a trial of Myrbetriq in April 2023.  He was changed to oxybutynin XL 10 mg daily due to cost of Myrbetriq.  He was referred to nephrology for chronic kidney disease.  CT imaging from 2023 showed mild renal atrophy without hydronephrosis.  Renal ultrasound in July 2023 showed bilateral renal calculi.  KUB from 7/23 showed no obvious stones.  He had an episode of urinary retention following laparoscopic cholecystectomy on 09/18/2023.  A Foley catheter  was left in place and he was taking tamsulosin.  His catheter was removed after a voiding trial on 09/27/2023. He was seen in urgent care on 09/30/2023 with dysuria.  Urinalysis was unremarkable. He was voiding spontaneously after the catheter was removed.  The dysuria improved.  He continued with urgency and some hesitancy.  No gross hematuria or flank pain.  He continued on tamsulosin and oxybutynin XL. IPSS = 13. PVR = 90 ml.  At his visit in November 2024, he continued on tamsulosin.  He reported voiding with a good stream.  No dysuria.  His main complaint was nocturia 1-2 times with urgency, primarily at night. IPSS = 12. PVR = 0 ml He was given a trial of trospium 20 mg nightly.  At his visit in January 2025, he continued on tamsulosin.  He was taking the trospium 20 mg at night but had not seen any significant change in his symptoms.  He continued to have frequency, urgency, slowing of his stream. IPSS = 14 QOL = 4/6. PVR = 75 mL. The trospium was discontinued and he was given samples of Gemtesa.  He was also changed from tamsulosin to alfuzosin.  At his visit in February 2025, he continued on alfuzosin and Gemtesa with some improvement in his frequency, urgency, and nocturia with the new combination.  No side effects.  No dysuria or gross hematuria. IPSS = 12.  He returns today for follow-up.  He continues on alfuzosin.  He discontinued the [place] due  to cost.  He reports urgency, frequency, and nocturia x 2.  He does feel like his urinary symptoms are slightly improved despite being off of the [place]. IPSS = 14/4 today. He continues to have problems with erectile dysfunction.  No benefit with tadalafil.  Portions of the above documentation were copied from a prior visit for review purposes only.   Past Medical History:  Past Medical History:  Diagnosis Date   Bronchitis    Diverticulosis    FH: BPH (benign prostatic hypertrophy)    History of Graves' disease 01/19/2018    S/p ablation   HTN (hypertension)    Hyperlipidemia    Obesity    Psoriatic arthritis (HCC)    Rheumatoid arthritis (HCC) 08/07/2014   Sleep apnea    CPAP   Thyroid disease     Past Surgical History:  Past Surgical History:  Procedure Laterality Date   ABDOMINAL AORTOGRAM W/LOWER EXTREMITY N/A 01/14/2022   Procedure: ABDOMINAL AORTOGRAM W/LOWER EXTREMITY;  Surgeon: Iran Ouch, MD;  Location: [facility]AS[facility];  Service: Cardiovascular;  Laterality: N/A;   ATRIAL FIBRILLATION ABLATION N/A 12/24/2022   Procedure: ATRIAL FIBRILLATION ABLATION;  Surgeon: Maurice Small, MD;  Location: [facility];  Service: Cardiovascular;  Laterality: N/A;   CHOLECYSTECTOMY N/A 09/18/2023   Procedure: LAPAROSCOPIC CHOLECYSTECTOMY WITH INTRAOPERATIVE CHOLANGIOGRAM;  Surgeon: Kinsinger, De Blanch, MD;  Location: [facility] OR;  Service: General;  Laterality: N/A;   INGUINAL HERNIA REPAIR     bilateral   KNEE SURGERY     bilateral arthroscopic   l foot surgery Left 12/2020   PACEMAKER IMPLANT N/A 12/13/2019   Procedure: PACEMAKER IMPLANT;  Surgeon: Marinus Maw, MD;  Location: [facility];  Service: Cardiovascular;  Laterality: N/A;   TRANSURETHRAL RESECTION OF PROSTATE     UMBILICAL HERNIA REPAIR      Allergies:  Allergies  Allergen Reactions   Codeine     Tension, nasty feeling    Nsaids Other (See Comments)    Renal insufficiency   Prednisone Other (See Comments)    Unknown/ sometimes takes with lower dose   Statins Other (See Comments)    Joint pain    Sulfa Antibiotics Other (See Comments)    Joint pain    Family History:  Family History  Problem Relation Age of Onset   CAD Mother 6       CABG   CAD Father 47       Died age 60   Diabetes Brother    Lung cancer Maternal Grandmother    Prostate cancer Neg Hx    Colon cancer Neg Hx     Social History:  Social History   Tobacco Use   Smoking status: Former    Types: Cigarettes   Smokeless tobacco:  Never   Tobacco comments:    Former smoker Quit 46 years ago. 01/21/23  Vaping Use   Vaping status: Never Used  Substance Use Topics   Alcohol use: No   Drug use: No    ROS: Constitutional:  Negative for fever, chills, weight loss CV: Negative for chest pain, previous MI, hypertension Respiratory:  Negative for shortness of breath, wheezing, sleep apnea, frequent cough GI:  Negative for nausea, vomiting, bloody stool, GERD  Physical exam: BP 114/72   Pulse 77   Ht 5\' 10"  (1.778 m)   Wt 216 lb (98 kg)   BMI 30.99 kg/m  GENERAL APPEARANCE:  Well appearing, well developed, well nourished, NAD HEENT:  Atraumatic, normocephalic, oropharynx clear NECK:  Supple without lymphadenopathy or thyromegaly ABDOMEN:  Soft, non-tender, no masses EXTREMITIES:  Moves all extremities well, without clubbing, cyanosis, or edema NEUROLOGIC:  Alert and oriented x 3, normal gait, CN II-XII grossly intact MENTAL STATUS:  appropriate BACK:  Non-tender to palpation, No CVAT SKIN:  Warm, dry, and intact   Results: U/A:

## 2024-02-29 ENCOUNTER — Other Ambulatory Visit: Payer: Self-pay

## 2024-02-29 MED ORDER — AMLODIPINE BESYLATE 5 MG PO TABS: 5.0000 mg | ORAL_TABLET | Freq: Every day | ORAL | 2 refills | Status: DC

## 2024-03-12 ENCOUNTER — Encounter: Payer: Self-pay | Admitting: Gastroenterology

## 2024-03-12 ENCOUNTER — Encounter: Payer: Self-pay | Admitting: Urology

## 2024-03-13 ENCOUNTER — Ambulatory Visit (INDEPENDENT_AMBULATORY_CARE_PROVIDER_SITE_OTHER): Payer: Medicare Other

## 2024-03-13 DIAGNOSIS — I495 Sick sinus syndrome: Secondary | ICD-10-CM

## 2024-03-15 ENCOUNTER — Telehealth: Payer: Self-pay | Admitting: Cardiology

## 2024-03-15 DIAGNOSIS — N1832 Chronic kidney disease, stage 3b: Secondary | ICD-10-CM | POA: Diagnosis not present

## 2024-03-15 DIAGNOSIS — Z6831 Body mass index (BMI) 31.0-31.9, adult: Secondary | ICD-10-CM | POA: Diagnosis not present

## 2024-03-15 DIAGNOSIS — L4050 Arthropathic psoriasis, unspecified: Secondary | ICD-10-CM | POA: Diagnosis not present

## 2024-03-15 DIAGNOSIS — E669 Obesity, unspecified: Secondary | ICD-10-CM | POA: Diagnosis not present

## 2024-03-15 DIAGNOSIS — M5136 Other intervertebral disc degeneration, lumbar region with discogenic back pain only: Secondary | ICD-10-CM | POA: Diagnosis not present

## 2024-03-15 DIAGNOSIS — M25562 Pain in left knee: Secondary | ICD-10-CM | POA: Diagnosis not present

## 2024-03-15 DIAGNOSIS — L409 Psoriasis, unspecified: Secondary | ICD-10-CM | POA: Diagnosis not present

## 2024-03-15 DIAGNOSIS — G6289 Other specified polyneuropathies: Secondary | ICD-10-CM | POA: Diagnosis not present

## 2024-03-15 DIAGNOSIS — M1991 Primary osteoarthritis, unspecified site: Secondary | ICD-10-CM | POA: Diagnosis not present

## 2024-03-15 DIAGNOSIS — Z111 Encounter for screening for respiratory tuberculosis: Secondary | ICD-10-CM | POA: Diagnosis not present

## 2024-03-15 DIAGNOSIS — M65331 Trigger finger, right middle finger: Secondary | ICD-10-CM | POA: Diagnosis not present

## 2024-03-15 DIAGNOSIS — M25561 Pain in right knee: Secondary | ICD-10-CM | POA: Diagnosis not present

## 2024-03-15 LAB — HEPATIC FUNCTION PANEL
ALT: 24 U/L (ref 10–40)
AST: 27 (ref 14–40)
Alkaline Phosphatase: 88 (ref 25–125)
Bilirubin, Total: 0.6

## 2024-03-15 LAB — BASIC METABOLIC PANEL WITH GFR
BUN: 14 (ref 4–21)
CO2: 24 — AB (ref 13–22)
Chloride: 104 (ref 99–108)
Creatinine: 1.5 — AB (ref 0.6–1.3)
Glucose: 87
Potassium: 4.3 meq/L (ref 3.5–5.1)
Sodium: 143 (ref 137–147)

## 2024-03-15 LAB — CBC AND DIFFERENTIAL
HCT: 49 (ref 41–53)
Hemoglobin: 16 (ref 13.5–17.5)
Neutrophils Absolute: 2.7
Platelets: 171 10*3/uL (ref 150–400)
WBC: 5.1

## 2024-03-15 LAB — POCT ERYTHROCYTE SEDIMENTATION RATE, NON-AUTOMATED: Sed Rate: 2

## 2024-03-15 LAB — COMPREHENSIVE METABOLIC PANEL WITH GFR
Albumin: 4.2 (ref 3.5–5.0)
Calcium: 9.5 (ref 8.7–10.7)
Globulin: 2
eGFR: 47

## 2024-03-15 LAB — CBC: RBC: 5.72 — AB (ref 3.87–5.11)

## 2024-03-15 NOTE — Telephone Encounter (Signed)
  Per MyChart scheduling message:  Pt c/o BP issue: STAT if pt c/o blurred vision, one-sided weakness or slurred speech.  STAT if BP is GREATER than 180/120 TODAY.  STAT if BP is LESS than 90/60 and SYMPTOMATIC TODAY  1. What is your BP concern?   2. Have you taken any BP medication today?  3. What are your last 5 BP readings?  4. Are you having any other symptoms (ex. Dizziness, headache, blurred vision, passed out)?     BP readings are not consistent, but lately like 140's and 88 to 95.   I take one each of losartan and amlodi Ine, 50, and 5 respectively.   I have my BP record on my phone for inspection.   Very tired all the time. May need to adjust BP Meds.

## 2024-03-16 LAB — CUP PACEART REMOTE DEVICE CHECK
Battery Remaining Longevity: 109 mo
Battery Voltage: 2.99 V
Brady Statistic AP VP Percent: 16.98 %
Brady Statistic AP VS Percent: 71.49 %
Brady Statistic AS VP Percent: 0.15 %
Brady Statistic AS VS Percent: 11.38 %
Brady Statistic RA Percent Paced: 88.34 %
Brady Statistic RV Percent Paced: 17.13 %
Date Time Interrogation Session: 20250409122510
Implantable Lead Connection Status: 753985
Implantable Lead Connection Status: 753985
Implantable Lead Implant Date: 20210106
Implantable Lead Implant Date: 20210106
Implantable Lead Location: 753859
Implantable Lead Location: 753860
Implantable Lead Model: 3830
Implantable Lead Model: 5076
Implantable Pulse Generator Implant Date: 20210106
Lead Channel Impedance Value: 304 Ohm
Lead Channel Impedance Value: 361 Ohm
Lead Channel Impedance Value: 361 Ohm
Lead Channel Impedance Value: 513 Ohm
Lead Channel Pacing Threshold Amplitude: 0.5 V
Lead Channel Pacing Threshold Amplitude: 0.75 V
Lead Channel Pacing Threshold Pulse Width: 0.4 ms
Lead Channel Pacing Threshold Pulse Width: 0.4 ms
Lead Channel Sensing Intrinsic Amplitude: 1.5 mV
Lead Channel Sensing Intrinsic Amplitude: 1.5 mV
Lead Channel Sensing Intrinsic Amplitude: 26.375 mV
Lead Channel Sensing Intrinsic Amplitude: 26.375 mV
Lead Channel Setting Pacing Amplitude: 1.5 V
Lead Channel Setting Pacing Amplitude: 2.5 V
Lead Channel Setting Pacing Pulse Width: 0.4 ms
Lead Channel Setting Sensing Sensitivity: 2 mV
Zone Setting Status: 755011
Zone Setting Status: 755011

## 2024-03-16 NOTE — Telephone Encounter (Signed)
 Attempted to call patient x2 on number in chart, message states "call cannot be completed at dialed please try again later"

## 2024-03-17 ENCOUNTER — Ambulatory Visit: Payer: Medicare Other

## 2024-03-17 DIAGNOSIS — Z23 Encounter for immunization: Secondary | ICD-10-CM

## 2024-03-20 NOTE — Telephone Encounter (Signed)
 2nd attempt to call patient, no answer voicemail not set up.

## 2024-03-21 ENCOUNTER — Encounter: Payer: Self-pay | Admitting: Internal Medicine

## 2024-03-21 NOTE — Telephone Encounter (Signed)
 3rd attempt to call patient, no answer, unable to leave message  Previous MyChart message also sent  Nursing will await for patient to return call

## 2024-03-24 DIAGNOSIS — G4733 Obstructive sleep apnea (adult) (pediatric): Secondary | ICD-10-CM | POA: Diagnosis not present

## 2024-03-27 ENCOUNTER — Telehealth: Payer: Self-pay | Admitting: Cardiology

## 2024-03-27 NOTE — Telephone Encounter (Signed)
 Patient identification verified by 2 forms. Sims Duck, RN     Tried calling. Phone went straight to VM but VM has not been set up to allow messages.

## 2024-03-27 NOTE — Telephone Encounter (Signed)
 STAT if patient feels like he/she is going to faint   1. Are you feeling dizzy, lightheaded, or faint right now?   Yes   2. Have you passed out?  No (If yes move to .[facility])  3. Do you have any other symptoms? Nausea, headache  4. Have you checked your HR and BP (record if available)?   4/21 - 127/73  HR 65  4/20 - 143/84  HR 62 4/19 - 131/80  HR 60 (10:00 pm) 4/19 - 118/76  HR 60 (9:30 am) 4/18 - 137/88  HR 64 (noon) 4/18 - 145/92  HR 60 (9:52 am)  Patient noted he took extra medication on 4/18 at 10:00 am and his BP was still reading high at noon and he took additional medication.  Patient noted his BP has been erratic.

## 2024-03-27 NOTE — Telephone Encounter (Signed)
 Called and unable to reach patient. Left message to return call

## 2024-03-28 NOTE — Telephone Encounter (Signed)
 3rd attempt to call patient, no answer, voicemail not set up, unable to leave message  Nursing will await for patient to return call

## 2024-04-04 ENCOUNTER — Encounter: Payer: Self-pay | Admitting: Internal Medicine

## 2024-04-04 ENCOUNTER — Ambulatory Visit (INDEPENDENT_AMBULATORY_CARE_PROVIDER_SITE_OTHER): Payer: Medicare Other | Admitting: Internal Medicine

## 2024-04-04 VITALS — BP 116/84 | HR 74 | Temp 98.2°F | Resp 16 | Ht 70.0 in | Wt 216.2 lb

## 2024-04-04 DIAGNOSIS — E538 Deficiency of other specified B group vitamins: Secondary | ICD-10-CM

## 2024-04-04 DIAGNOSIS — N184 Chronic kidney disease, stage 4 (severe): Secondary | ICD-10-CM

## 2024-04-04 DIAGNOSIS — G629 Polyneuropathy, unspecified: Secondary | ICD-10-CM

## 2024-04-04 DIAGNOSIS — E039 Hypothyroidism, unspecified: Secondary | ICD-10-CM

## 2024-04-04 DIAGNOSIS — E785 Hyperlipidemia, unspecified: Secondary | ICD-10-CM | POA: Diagnosis not present

## 2024-04-04 LAB — QUANTIFERON-TB GOLD PLUS
QUANTIFERON MITOGEN VALUE: >10
QuantiFERON TB1 Ag Value: 0.04
QuantiFERON TB2 Ag Value: 0.04
QuantiFERON-TB Gold Plus: NEGATIVE
Quantiferon Nil Value: 0.04

## 2024-04-04 NOTE — Patient Instructions (Addendum)
 INSTRUCTIONS  FOR TODAY   Check the  blood pressure regularly Blood pressure goal:  between 110/65 and  135/85. If it is consistently higher or lower, let me know     GO TO THE LAB : Get the blood work     Next office visit for a checkup in 4 months Please make an appointment before you leave today

## 2024-04-04 NOTE — Progress Notes (Unsigned)
 Subjective:    Patient ID: Martin Rubio, male    DOB: 12-25-1945, 78 y.o.   MRN: [id number]  DOS:  04/04/2024 Type of visit - description: Follow-up  Routine visit. Saw urology, GI, nephrology, notes and labs reviewed. He also have the following concerns Neuropathy: Ongoing symptoms, stop Lyrica .  He reports numbness on his feet, tingling.  Worse on the big toes. Also 1 day history of left upper abdominal pain, no pain this afternoon. Last bowel movement was today, stools dark which is normal for him "I take iron". The pain does not increase with eating. History of PVD: Denies claudication recently.   Review of Systems See above   Past Medical History:  Diagnosis Date   Bronchitis    Diverticulosis    FH: BPH (benign prostatic hypertrophy)    History of Graves' disease 01/19/2018   S/p ablation   HTN (hypertension)    Hyperlipidemia    Obesity    Psoriatic arthritis (HCC)    Rheumatoid arthritis (HCC) 08/07/2014   Sleep apnea    CPAP   Thyroid  disease     Past Surgical History:  Procedure Laterality Date   ABDOMINAL AORTOGRAM W/LOWER EXTREMITY N/A 01/14/2022   Procedure: ABDOMINAL AORTOGRAM W/LOWER EXTREMITY;  Surgeon: Wenona Hamilton, MD;  Location: [facility];  Service: Cardiovascular;  Laterality: N/A;   ATRIAL FIBRILLATION ABLATION N/A 12/24/2022   Procedure: ATRIAL FIBRILLATION ABLATION;  Surgeon: Efraim Grange, MD;  Location: [facility];  Service: Cardiovascular;  Laterality: N/A;   CHOLECYSTECTOMY N/A 09/18/2023   Procedure: LAPAROSCOPIC CHOLECYSTECTOMY WITH INTRAOPERATIVE CHOLANGIOGRAM;  Surgeon: Kinsinger, Alphonso Aschoff, MD;  Location: [facility] OR;  Service: General;  Laterality: N/A;   INGUINAL HERNIA REPAIR     bilateral   KNEE SURGERY     bilateral arthroscopic   l foot surgery Left 12/2020   PACEMAKER IMPLANT N/A 12/13/2019   Procedure: PACEMAKER IMPLANT;  Surgeon: Tammie Fall, MD;  Location: [facility];  Service:  Cardiovascular;  Laterality: N/A;   TRANSURETHRAL RESECTION OF PROSTATE     UMBILICAL HERNIA REPAIR      Current Outpatient Medications  Medication Instructions   alfuzosin  (UROXATRAL ) 10 mg, Oral, Daily with breakfast   amLODipine  (NORVASC ) 5 mg, Oral, Daily   cholecalciferol  (VITAMIN D3) 2,000 Units, Daily   diclofenac  Sodium (VOLTAREN ) 4 g, Topical, 2 times daily   dicyclomine  (BENTYL ) 20 MG tablet TAKE ONE TABLET BY MOUTH EVERY 6 HOURS AS NEEDED FOR ABDOMINAL CRAMPING/DIARRHEA   dofetilide  (TIKOSYN ) 250 mcg, Oral, 2 times daily   famotidine  (PEPCID ) 40 mg, Oral, Daily at bedtime   ferrous fumarate  (FERRETTS) 325 (106 Fe) MG TABS tablet 106 mg of iron, Oral, 2 times daily   fluticasone  (FLONASE ) 50 MCG/ACT nasal spray 1 spray, As needed   Gemtesa  75 mg, Oral, Daily   hydrOXYzine  (VISTARIL ) 25 mg, Oral, Every 8 hours PRN   levothyroxine  (SYNTHROID ) 137 mcg, Oral, Daily before breakfast   losartan  (COZAAR ) 50 MG tablet TAKE ONE TABLET BY MOUTH EVERY MORNING AND TAKE ONE TABLET BY MOUTH AT BEDTIME   meclizine  (ANTIVERT ) 25 mg, 3 times daily PRN   melatonin 5 mg, Daily at bedtime   methocarbamol  (ROBAXIN ) 500 mg, Oral, Every 8 hours PRN   OVER THE COUNTER MEDICATION 2 capsules, Daily   pantoprazole  (PROTONIX ) 40 mg, Oral, Daily   pregabalin  (LYRICA ) 50 mg, At bedtime PRN   Repatha  SureClick 140 mg, Subcutaneous, Every 14 days   rivaroxaban  (XARELTO )  20 mg, Oral, Daily with supper   triamcinolone  cream (KENALOG ) 0.1 % 1 application , Daily PRN   vardenafil  (LEVITRA ) 20 mg, Oral, Daily PRN   Vitamin B 12 1,000 mcg, Daily       Objective:   Physical Exam BP 116/84   Pulse 74   Temp 98.2 F (36.8 C) (Oral)   Resp 16   Ht 5\' 10"  (1.778 m)   Wt 216 lb 4 oz (98.1 kg)   SpO2 97%   BMI 31.03 kg/m  General:   Well developed, NAD, BMI noted.  HEENT:  Normocephalic . Face symmetric, atraumatic Lungs:  CTA B Normal respiratory effort, no intercostal retractions, no accessory  muscle use. Heart: RRR,  no murmur.  Abdomen:  Not distended, soft, non-tender. No rebound or rigidity.   Skin: Not pale. Not jaundice Lower extremities: no pretibial edema bilaterally  Neurologic:  alert & oriented X3.  Speech normal, gait appropriate for age and unassisted Psych--  Cognition and judgment appear intact.  Cooperative with normal attention span and concentration.  Behavior appropriate. No anxious or depressed appearing.     Assessment     ASSESSMENT  (transfer to me 12/2019) Hyperglycemia HTN High cholesterol, seen at the lipid clinic Hypothyroidism  Psoriatic Arthritis Dr Ebbie Goldmann  Osteoporosis: on fosamax  rx by PCP, start holiday by mid 2022 (see visit note from 02/29/2020) CKD mild,stable . Creatinine ~1.5 CV: -Paroxysmal A. Fib, dx 09/2019 anticoagulated.  Ablation 12/24/2022. -Sick sinus syndrome, s/p pacemaker (12/13/2019) -Septal hypertrophy per echo -Left common iliac aneurysm, AAA  - PVD OSA on CPAP BPH - sees urology HOH  Iron deficiency anemia: 12/02/2021 >> EGD (several polyps suggestive of FGP), C-scope:no polyps, no cancer. Pathology: benign H/o ocular migraines , dx by opht  per pt  B12 deficiency   PLAN  Labs 01-2024: Creatinine 1.6, vitamin D  normal, hemoglobin 15.9, platelets 136 slightly low. Labs 03/15/2024: Hemoglobin 16.  Platelets 171.  Creatinine 1.4.  LFTs normal. BPH: Last urology visit 02/28/2024, on alfuzosin , trial with vardenafil .  Next visit 3 months  GI: Saw GI 02/04/2024, for GERD, RUQ abdominal pain, bile salt diarrhea, hepatic asteatosis. Was rec continue Protonix  and Pepcid , had a, negative ultrasound, patient declined the EGD.  Left-sided abdominal pain: Started yesterday, abdominal exam benign, recommend observation, seek for help if worse. CKD stage III: Saw nephrology 01/26/2024, no changes made.  Hypothyroidism: Check a TSH Neuropathy: Long history of symptoms, previous take gabapentin  because of somnolence, he was  taking Lyrica  with perhaps some help but decided to stop because things he read about it. Sees Dr Godwin Lat  Will consider B12 shots, labs pending  B12 deficiency, history of: On po supplements. Checking labs.  PVD, aortic and iliac aneurysms: Last visit with Dr. Christia Cowboy 10-2023.  Currently with no claudication.  Next visit is scheduled for Apr 20, 2024. RTC 6 months ==== Acute gallstone pancreatitis: Had surgery 09/18/2023, seems to be doing well. Urinary retention: Went to the ER, had a Foley placed, had a successful void trial.  Currently essentially with no symptoms.  Will see urology tomorrow. Fecal impaction, resolved after the ER visit few days ago.  Currently having more regular bowel movements, still not back to his normal pattern, on MiraLAX . CKD: Mild, last BMP stable. Hypothyroidism: Recommend a TSH, declined need to get blood today. Preventive care: Strongly declines COVID flu shot.  Considering RSV. RTC 6 months, declined sooner follow-up.

## 2024-04-05 DIAGNOSIS — E538 Deficiency of other specified B group vitamins: Secondary | ICD-10-CM | POA: Insufficient documentation

## 2024-04-05 LAB — TSH: TSH: 1.6 u[IU]/mL (ref 0.35–5.50)

## 2024-04-05 LAB — B12 AND FOLATE PANEL
Folate: 13.2 ng/mL (ref >5.9)
Vitamin B-12: 553 pg/mL (ref 211–911)

## 2024-04-05 NOTE — Assessment & Plan Note (Signed)
 Chart review: Labs 01-2024: Creatinine 1.6, vitamin D  normal, hemoglobin 15.9, platelets 136 slightly low. Labs 03/15/2024: Hemoglobin 16.  Platelets 171.  Creatinine 1.4.  LFTs normal. BPH: Last urology visit 02/28/2024, on alfuzosin , trial with vardenafil .  Next visit 3 months GERD, abdominal pain, diarrhea:  Saw GI 02/04/2024, for GERD, RUQ abdominal pain, bile salt diarrhea, hepatic asteatosis. Was rec continue Protonix  and Pepcid , had a, negative ultrasound, patient declined a EGD.  Left-sided abdominal pain: Started yesterday, abdominal exam benign, recommend observation, seek for help if worse. CKD stage III: Saw nephrology 01/26/2024, no changes made. Hypothyroidism: Check a TSH Neuropathy: Long history of symptoms, previous take gabapentin  because of somnolence, he was taking Lyrica  with perhaps some help but decided to stop because "bad things I have read about the medication". Sees Dr Godwin Lat  Will consider high-dose B12 supplementation.  Labs pending. B12 deficiency, h/o: On po supplements. Checking labs. PVD, aortic and iliac aneurysms: Last visit with Dr. Christia Cowboy 10-2023.  Currently with no claudication.  Next visit is scheduled for Apr 20, 2024. RTC 6 months

## 2024-04-06 ENCOUNTER — Other Ambulatory Visit: Payer: Self-pay | Admitting: Cardiovascular Disease

## 2024-04-06 DIAGNOSIS — E785 Hyperlipidemia, unspecified: Secondary | ICD-10-CM

## 2024-04-06 DIAGNOSIS — I70219 Atherosclerosis of native arteries of extremities with intermittent claudication, unspecified extremity: Secondary | ICD-10-CM

## 2024-04-06 DIAGNOSIS — I2510 Atherosclerotic heart disease of native coronary artery without angina pectoris: Secondary | ICD-10-CM

## 2024-04-07 ENCOUNTER — Encounter: Payer: Self-pay | Admitting: Internal Medicine

## 2024-04-07 ENCOUNTER — Ambulatory Visit: Payer: Medicare Other | Admitting: Gastroenterology

## 2024-04-07 DIAGNOSIS — G629 Polyneuropathy, unspecified: Secondary | ICD-10-CM

## 2024-04-10 ENCOUNTER — Other Ambulatory Visit: Payer: Self-pay | Admitting: Internal Medicine

## 2024-04-17 ENCOUNTER — Ambulatory Visit: Attending: Student | Admitting: Student

## 2024-04-17 ENCOUNTER — Encounter: Payer: Self-pay | Admitting: Student

## 2024-04-17 VITALS — BP 106/68 | HR 86 | Ht 70.0 in | Wt 216.4 lb

## 2024-04-17 DIAGNOSIS — I2510 Atherosclerotic heart disease of native coronary artery without angina pectoris: Secondary | ICD-10-CM

## 2024-04-17 DIAGNOSIS — I495 Sick sinus syndrome: Secondary | ICD-10-CM

## 2024-04-17 DIAGNOSIS — I480 Paroxysmal atrial fibrillation: Secondary | ICD-10-CM | POA: Diagnosis not present

## 2024-04-17 DIAGNOSIS — I442 Atrioventricular block, complete: Secondary | ICD-10-CM | POA: Diagnosis not present

## 2024-04-17 NOTE — Patient Instructions (Signed)
 Medication Instructions:  Your physician recommends that you continue on your current medications as directed. Please refer to the Current Medication list given to you today.  *If you need a refill on your cardiac medications before your next appointment, please call your pharmacy*  Lab Work: BMET, MAG-TODAY If you have labs (blood work) drawn today and your tests are completely normal, you will receive your results only by: MyChart Message (if you have MyChart) OR A paper copy in the mail If you have any lab test that is abnormal or we need to change your treatment, we will call you to review the results.  Follow-Up: At [facility], you and your health needs are our priority.  As part of our continuing mission to provide you with exceptional heart care, our providers are all part of one team.  This team includes your primary Cardiologist (physician) and Advanced Practice Providers or APPs (Physician Assistants and Nurse Practitioners) who all work together to provide you with the care you need, when you need it.  Your next appointment:   6 month(s)  Provider:   You will see one of the following Advanced Practice Providers on your designated Care Team:   Mertha Abrahams, [place] [address] [place], [place] Creighton Doffing, NP

## 2024-04-17 NOTE — Progress Notes (Signed)
  Electrophysiology Office Note:   ID:  Javontae, Dickes 1946-07-20, MRN [id number]  Primary Cardiologist: Eilleen Grates, MD Electrophysiologist: Efraim Grange, MD      History of Present Illness:   Martin Rubio is a 78 y.o. male with h/o paroxysmal AF, SSS s/p PPM, HTN, Hypertrophic Cardiomyopathy, AV sclerosis  seen today for routine electrophysiology followup.   Since last being seen in our clinic the patient reports doing OK from a cardiac perspective. Had gallbladder surgery and has had some issue managing his diet and BP meds. Has been sliding scale his losartan  based on his BP. Otherwise, he denies chest pain, palpitations, dyspnea, PND, orthopnea, nausea, vomiting, dizziness, syncope, weight gain, or early satiety.  Has mild peripheral edema  Review of systems complete and found to be negative unless listed in HPI.   EP Information / Studies Reviewed:    EKG is ordered today. Personal review as below.  EKG Interpretation Date/Time:  Monday Apr 17 2024 10:42:19 EDT Ventricular Rate:  71 PR Interval:  240 QRS Duration:  84 QT Interval:  392 QTC Calculation: 425 R Axis:   -26  Text Interpretation: Atrial-paced rhythm with prolonged AV conduction Left ventricular hypertrophy with repolarization abnormality ( R in aVL , Romhilt-Estes ) When compared with ECG of 18-Oct-2023 11:48, Electronic atrial pacemaker has replaced Junctional rhythm Nonspecific T wave abnormality now evident in Inferior leads Confirmed by Pilar Bridge [phone]) on 04/17/2024 10:46:53 AM    PPM Interrogation-  Most recent remote reviewed in detail today.  Arrhythmia/Device History MDT Dual Chamber PPM implanted 12/2019 for SND  S/p PVI 12/2022   Physical Exam:   VS:  BP 106/68   Pulse 86   Ht 5\' 10"  (1.778 m)   Wt 216 lb 6.4 oz (98.2 kg)   SpO2 95%   BMI 31.05 kg/m    Wt Readings from Last 3 Encounters:  04/17/24 216 lb 6.4 oz (98.2 kg)  04/04/24 216 lb 4 oz (98.1 kg)  02/28/24 216 lb  (98 kg)     GEN: No acute distress  NECK: No JVD; No carotid bruits CARDIAC: Regular rate and rhythm, no murmurs, rubs, gallops RESPIRATORY:  Clear to auscultation without rales, wheezing or rhonchi  ABDOMEN: Soft, non-tender, non-distended EXTREMITIES:  Trace edema; No deformity   ASSESSMENT AND PLAN:    SND s/p [employer] PPM  Normal in clinic interrogation 10/2023 and normal remote 03/2024. No changes or check today  Paroxysmal AF S/p ablation 12/2022 Continue Tikosyn  250 mcg BID LAbs today.  Continue Xarelto  20 mg daily  Secondary hypercoagulable state Pt on Xarelto  as above   HTN Stable on current regimen  Encouraged to take medications regularly as oppose to sliding scale BP meds.  He will call back for direction from gen cards if he has BP issues back on his standard regimen.    Disposition:   Follow up with EP APP in 6 months  Signed, Tylene Galla, PA-C

## 2024-04-18 NOTE — Progress Notes (Unsigned)
 Office Note    HPI: Martin Rubio is a 78 y.o. (14-May-1946) male presenting in follow-up with known, small AAA, bilateral iliac artery stenosis, left common iliac artery aneurysm.  A [place]  native, he moved down to [place]  years ago.  He is an Art gallery manager by trade.  He was in the [employer] for years.  Martin Rubio happily married, and his wife is of Jewish [place] descent.  Since last seen 6 months ago, Martin Rubio has been doing well. He denies symptoms of claudication, ischemic rest pain, tissue loss.  Denies abdominal pain, chest pain.  Has baseline back pain, which is no worse.  He has had some waxing waning abdominal pain.  No current pain.  He is working to increase his exercise routine, and has lost 12 pounds.  He notes baseline neuropathy which is slowly worsening.  He is very involved in [place], and has a strong faith.  He is part of a prayer group at the state level.  Unable to take statins due to myalgia  The pt is not on a statin for cholesterol management.  The pt is not on a daily aspirin .   Other AC:  Xarelto  The pt is  on medication for hypertension.   The pt is not diabetic.  Tobacco hx:  -  Past Medical History:  Diagnosis Date   Bronchitis    Diverticulosis    FH: BPH (benign prostatic hypertrophy)    History of Graves' disease 01/19/2018   S/p ablation   HTN (hypertension)    Hyperlipidemia    Obesity    Psoriatic arthritis (HCC)    Rheumatoid arthritis (HCC) 08/07/2014   Sleep apnea    CPAP   Thyroid  disease     Past Surgical History:  Procedure Laterality Date   ABDOMINAL AORTOGRAM W/LOWER EXTREMITY N/A 01/14/2022   Procedure: ABDOMINAL AORTOGRAM W/LOWER EXTREMITY;  Surgeon: Wenona Hamilton, MD;  Location: [facility];  Service: Cardiovascular;  Laterality: N/A;   ATRIAL FIBRILLATION ABLATION N/A 12/24/2022   Procedure: ATRIAL FIBRILLATION ABLATION;  Surgeon: Efraim Grange, MD;  Location: [facility];  Service: Cardiovascular;  Laterality:  N/A;   CHOLECYSTECTOMY N/A 09/18/2023   Procedure: LAPAROSCOPIC CHOLECYSTECTOMY WITH INTRAOPERATIVE CHOLANGIOGRAM;  Surgeon: Kinsinger, Alphonso Aschoff, MD;  Location: [facility] OR;  Service: General;  Laterality: N/A;   INGUINAL HERNIA REPAIR     bilateral   KNEE SURGERY     bilateral arthroscopic   l foot surgery Left 12/2020   PACEMAKER IMPLANT N/A 12/13/2019   Procedure: PACEMAKER IMPLANT;  Surgeon: Tammie Fall, MD;  Location: [facility];  Service: Cardiovascular;  Laterality: N/A;   TRANSURETHRAL RESECTION OF PROSTATE     UMBILICAL HERNIA REPAIR      Social History   Socioeconomic History   Marital status: Married    Spouse name: Not on file   Number of children: 2   Years of education: Not on file   Highest education level: Associate degree: occupational, Scientist, product/process development, or vocational program  Occupational History   Occupation: Retired    Associate Professor: [employer]  Tobacco Use   Smoking status: Former    Types: Cigarettes   Smokeless tobacco: Never   Tobacco comments:    Former smoker Quit 46 years ago. 01/21/23  Vaping Use   Vaping status: Never Used  Substance and Sexual Activity   Alcohol use: No   Drug use: No   Sexual activity: Not on file  Other Topics Concern   Not  on file  Social History Narrative   Lives with wife.   Social Drivers of Corporate investment banker Strain: Low Risk  (04/03/2024)   Overall Financial Resource Strain (CARDIA)    Difficulty of Paying Living Expenses: Not very hard  Food Insecurity: No Food Insecurity (04/03/2024)   Hunger Vital Sign    Worried About Running Out of Food in the Last Year: Never true    Ran Out of Food in the Last Year: Never true  Transportation Needs: No Transportation Needs (04/03/2024)   PRAPARE - Administrator, Civil Service (Medical): No    Lack of Transportation (Non-Medical): No  Physical Activity: Insufficiently Active (04/03/2024)   Exercise Vital Sign    Days of Exercise per Week: 3 days     Minutes of Exercise per Session: 20 min  Stress: No Stress Concern Present (04/03/2024)   [employer] of Occupational Health - Occupational Stress Questionnaire    Feeling of Stress : Not at all  Social Connections: Socially Integrated (04/03/2024)   Social Connection and Isolation Panel [NHANES]    Frequency of Communication with Friends and Family: Three times a week    Frequency of Social Gatherings with Friends and Family: Twice a week    Attends Religious Services: More than 4 times per year    Active Member of [employer] or Organizations: Yes    Attends Engineer, structural: More than 4 times per year    Marital Status: Married  Catering manager Violence: Not At Risk (09/17/2023)   Humiliation, Afraid, Rape, and Kick questionnaire    Fear of Current or Ex-Partner: No    Emotionally Abused: No    Physically Abused: No    Sexually Abused: No   Family History  Problem Relation Age of Onset   CAD Mother 18       CABG   CAD Father 23       Died age 47   Diabetes Brother    Lung cancer Maternal Grandmother    Prostate cancer Neg Hx    Colon cancer Neg Hx     Current Outpatient Medications  Medication Sig Dispense Refill   alfuzosin  (UROXATRAL ) 10 MG 24 hr tablet Take 1 tablet (10 mg total) by mouth daily with breakfast. 30 tablet 11   amLODipine  (NORVASC ) 5 MG tablet Take 1 tablet (5 mg total) by mouth daily. 90 tablet 2   cholecalciferol  (VITAMIN D3) 25 MCG (1000 UNIT) tablet Take 2,000 Units by mouth daily in the afternoon.     Cyanocobalamin  (VITAMIN B 12) 500 MCG TABS Take 1,000 mcg by mouth daily in the afternoon.     diclofenac  Sodium (VOLTAREN ) 1 % GEL Apply 4 g topically in the morning and at bedtime. 100 g 6   dicyclomine  (BENTYL ) 20 MG tablet TAKE ONE TABLET BY MOUTH EVERY 6 HOURS AS NEEDED FOR ABDOMINAL CRAMPING/DIARRHEA 30 tablet 0   dofetilide  (TIKOSYN ) 250 MCG capsule TAKE ONE CAPSULE BY MOUTH TWICE A DAY 180 capsule 1   Evolocumab  (REPATHA  SURECLICK) 140  MG/ML SOAJ INJECT 140 MG UNDER THE SKIN EVERY TWO WEEKS INTO THE THIGH, STOMACH, OR UPPER ARM 2 mL 11   famotidine  (PEPCID ) 40 MG tablet Take 1 tablet (40 mg total) by mouth at bedtime. 90 tablet 1   ferrous fumarate  (FERRETTS) 325 (106 Fe) MG TABS tablet Take 1 tablet (106 mg of iron total) by mouth 2 (two) times daily. (Patient taking differently: Take 1 tablet by mouth daily.) 180  tablet 1   fluticasone  (FLONASE ) 50 MCG/ACT nasal spray Place 1 spray into both nostrils as needed for allergies.     hydrOXYzine  (VISTARIL ) 25 MG capsule Take 1 capsule (25 mg total) by mouth every 8 (eight) hours as needed. 60 capsule 3   levothyroxine  (SYNTHROID ) 137 MCG tablet Take 1 tablet (137 mcg total) by mouth daily before breakfast. 90 tablet 1   losartan  (COZAAR ) 50 MG tablet TAKE ONE TABLET BY MOUTH EVERY MORNING AND TAKE ONE TABLET BY MOUTH AT BEDTIME 180 tablet 2   meclizine  (ANTIVERT ) 25 MG tablet Take 25 mg by mouth 3 (three) times daily as needed for dizziness.     Melatonin 5 MG TABS Take 5 mg by mouth at bedtime.     OVER THE COUNTER MEDICATION Take 2 capsules by mouth daily in the afternoon. Nervprin - Healthy Nerve Support - Peripheral Neuropathy Relief     pantoprazole  (PROTONIX ) 40 MG tablet Take 1 tablet (40 mg total) by mouth daily. 90 tablet 1   rivaroxaban  (XARELTO ) 20 MG TABS tablet Take 1 tablet (20 mg total) by mouth daily with supper. 90 tablet 1   triamcinolone  cream (KENALOG ) 0.1 % Apply 1 application  topically daily as needed for itching.     vardenafil  (LEVITRA ) 20 MG tablet Take 1 tablet (20 mg total) by mouth daily as needed for erectile dysfunction. 10 tablet 11   No current facility-administered medications for this visit.    Allergies  Allergen Reactions   Codeine     Tension, nasty feeling    Nsaids Other (See Comments)    Renal insufficiency   Prednisone Other (See Comments)    Unknown/ sometimes takes with lower dose   Statins Other (See Comments)    Joint pain     Sulfa Antibiotics Other (See Comments)    Joint pain     REVIEW OF SYSTEMS:   [X]  denotes positive finding, [ ]  denotes negative finding Cardiac  Comments:  Chest pain or chest pressure:    Shortness of breath upon exertion:    Short of breath when lying flat:    Irregular heart rhythm:        Vascular    Pain in calf, thigh, or hip brought on by ambulation:    Pain in feet at night that wakes you up from your sleep:     Blood clot in your veins:    Leg swelling:         Pulmonary    Oxygen at home:    Productive cough:     Wheezing:         Neurologic    Sudden weakness in arms or legs:     Sudden numbness in arms or legs:     Sudden onset of difficulty speaking or slurred speech:    Temporary loss of vision in one eye:     Problems with dizziness:         Gastrointestinal    Blood in stool:     Vomited blood:         Genitourinary    Burning when urinating:     Blood in urine:        Psychiatric    Major depression:         Hematologic    Bleeding problems:    Problems with blood clotting too easily:        Skin    Rashes or ulcers:        Constitutional  Fever or chills:      PHYSICAL EXAMINATION:  There were no vitals filed for this visit.  General:  WDWN in NAD; vital signs documented above Gait: Not observed HENT: WNL, normocephalic Pulmonary: normal non-labored breathing , without wheezing Cardiac: regular HR,  Abdomen: soft, NT, no masses Skin: without rashes Vascular Exam/Pulses:  Right Left  Radial 2+ (normal) 2+ (normal)  Ulnar 2+ (normal) 2+ (normal)  Femoral 2+ (normal) 2+ (normal)  Popliteal    DP    PT     Extremities: without ischemic changes, without Gangrene , without cellulitis; without open wounds;  Musculoskeletal: no muscle wasting or atrophy  Neurologic: A&O X 3;  No focal weakness or paresthesias are detected Psychiatric:  The pt has Normal affect.   Non-Invasive Vascular Imaging:      Abdominal Aorta  Findings:  +-----------+-------+----------+----------+--------+--------+--------+  Location  AP (cm)Trans (cm)PSV (cm/s)WaveformThrombusComments  +-----------+-------+----------+----------+--------+--------+--------+  Proximal  2.09   2.35      64                                  +-----------+-------+----------+----------+--------+--------+--------+  Mid       2.89   2.94      61                                  +-----------+-------+----------+----------+--------+--------+--------+  Distal    2.24   2.35      64                                  +-----------+-------+----------+----------+--------+--------+--------+  RT CIA Prox1.4    1.5       18                                  +-----------+-------+----------+----------+--------+--------+--------+  LT CIA Prox2.7    2.6       454                                 +-----------+-------+----------+----------+--------+--------+--------+   +-------+-----------+-----------+------------+------------+  ABI/TBIToday's ABIToday's TBIPrevious ABIPrevious TBI  +-------+-----------+-----------+------------+------------+  Right 0.96       0.59       1.03        0.62          +-------+-----------+-----------+------------+------------+  Left  0.91       0.66       0.95        0.53          +-------+-----------+-----------+------------+------------+       ASSESSMENT/PLAN: Martin Rubio is a 77 y.o. male presenting with mixed aneurysmal occlusive disease.  He denied significant claudication symptoms.  He is working to ambulate more, and is lost 12 pounds.  He notes he does not ambulate nearly as much as he used to. Continues to have progressive neuropathy. On physical exam, he had palpable femoral pulses. ABIs with triphasic waveforms. Infrarenal AAA, left-sided CIA aneurysms unchanged, right sided ectasia unchanged.  We discussed that his infrarenal abdominal aneurysm would be repaired  electively at a size of 5.5 cm and the common iliac artery aneurysm repaired electively at a size of 3.5 cm, or if growth exceeds 0.5  cm in 12 months.  My plan is to see him on a 43-month basis with repeat imaging. We discussed the signs and symptoms of rupture, and I asked him to call 911 immediately should any of these occur.    Kayla Part, MD [facility] [phone]  Total time of patient care including pre-visit research, consultation, and documentation greater than 30 minutes

## 2024-04-19 ENCOUNTER — Ambulatory Visit: Payer: Self-pay | Admitting: Student

## 2024-04-19 ENCOUNTER — Ambulatory Visit (INDEPENDENT_AMBULATORY_CARE_PROVIDER_SITE_OTHER): Admitting: Podiatry

## 2024-04-19 ENCOUNTER — Telehealth: Payer: Self-pay

## 2024-04-19 ENCOUNTER — Encounter: Payer: Self-pay | Admitting: Podiatry

## 2024-04-19 DIAGNOSIS — M79674 Pain in right toe(s): Secondary | ICD-10-CM

## 2024-04-19 DIAGNOSIS — D2372 Other benign neoplasm of skin of left lower limb, including hip: Secondary | ICD-10-CM

## 2024-04-19 DIAGNOSIS — M79675 Pain in left toe(s): Secondary | ICD-10-CM | POA: Diagnosis not present

## 2024-04-19 DIAGNOSIS — G629 Polyneuropathy, unspecified: Secondary | ICD-10-CM

## 2024-04-19 DIAGNOSIS — B351 Tinea unguium: Secondary | ICD-10-CM | POA: Diagnosis not present

## 2024-04-19 LAB — BASIC METABOLIC PANEL WITH GFR
BUN/Creatinine Ratio: 10 (ref 10–24)
BUN: 15 mg/dL (ref 8–27)
CO2: 20 mmol/L (ref 20–29)
Calcium: 9.4 mg/dL (ref 8.6–10.2)
Chloride: 106 mmol/L (ref 96–106)
Creatinine, Ser: 1.57 mg/dL — ABNORMAL HIGH (ref 0.76–1.27)
Glucose: 96 mg/dL (ref 70–99)
Potassium: 4.7 mmol/L (ref 3.5–5.2)
Sodium: 142 mmol/L (ref 134–144)
eGFR: 45 mL/min/{1.73_m2} — ABNORMAL LOW (ref >59)

## 2024-04-19 LAB — MAGNESIUM: Magnesium: 1.9 mg/dL (ref 1.6–2.3)

## 2024-04-19 NOTE — Telephone Encounter (Signed)
 Dr. Lydia Sams is a interventional spine and pain specialist when looking him up. I went ahead and placed the referral though.

## 2024-04-19 NOTE — Telephone Encounter (Signed)
 error

## 2024-04-19 NOTE — Telephone Encounter (Signed)
 Refer patient as requested, Dx neuropathy

## 2024-04-19 NOTE — Progress Notes (Signed)
 Chief Complaint  Patient presents with   Callouses    RM#8 Patient presents today with pain on right big toe nail possible ingrown nail also has calluses needing evaluation.    SUBJECTIVE Patient presents to office today complaining of elongated, thickened nails that cause pain while ambulating in shoes.  Patient is unable to trim their own nails.  Patient also has symptomatic skin lesions to the plantar aspect of the bilateral forefoot.    Patient also has a long history of peripheral polyneuropathy to the bilateral forefoot.  He experiences burning tingling numbness sensation to the toes bilaterally.  He has tried oral Lyrica  as well as gabapentin  without any significant relief.  He is requesting additional treatment options to discuss to potentially alleviate some of his neuropathic symptoms patient is here for further evaluation and treatment.  Past Medical History:  Diagnosis Date   Bronchitis    Diverticulosis    FH: BPH (benign prostatic hypertrophy)    History of Graves' disease 01/19/2018   S/p ablation   HTN (hypertension)    Hyperlipidemia    Obesity    Psoriatic arthritis (HCC)    Rheumatoid arthritis (HCC) 08/07/2014   Sleep apnea    CPAP   Thyroid  disease     Allergies  Allergen Reactions   Codeine     Tension, nasty feeling    Nsaids Other (See Comments)    Renal insufficiency   Prednisone Other (See Comments)    Unknown/ sometimes takes with lower dose   Statins Other (See Comments)    Joint pain    Sulfa Antibiotics Other (See Comments)    Joint pain     OBJECTIVE General Patient is awake, alert, and oriented x 3 and in no acute distress. Derm Skin is dry and supple bilateral. Negative open lesions or macerations. Remaining integument unremarkable. Nails are tender, long, thickened and dystrophic with subungual debris, consistent with onychomycosis, 1-5 bilateral. No signs of infection noted.  There are some hyperkeratotic tissue to the plantar  aspect of the bilateral forefoot secondary to pressure Vasc  DP and PT pedal pulses palpable bilaterally. Temperature gradient within normal limits.  Neuro grossly intact via light touch.  Paresthesia with hypersensitivity noted to the feet Musculoskeletal Exam high arches noted with hammertoe deformity creating retrograde pressure to the bilateral forefoot  ASSESSMENT 1.  Pain due to onychomycosis of toenails both 2.  Symptomatic skin lesions bilateral plantar forefoot 3.  High arches bilateral with hammertoe deformity; metatarsalgia 4.  Chronic peripheral polyneuropathy bilateral lower extremities  PLAN OF CARE -Patient evaluated today.  -Instructed to maintain good pedal hygiene and foot care.  -Mechanical debridement of nails 1-5 bilaterally performed using a nail nipper. Filed with dremel without incident.  -Excisional debridement of the hyperkeratotic tissue was performed using a 312 scalpel.  Salicylic acid and a light dressing applied. -Advised against going barefoot.  Recommend good arch supports to offload pressure from the forefoot -Patient continues to experience neuropathy symptoms to the bilateral forefoot despite conservative treatment.  He has tried both oral gabapentin  and Lyrica  as well as topical applications to alleviate his symptoms with no improvement.  I do believe he would be a good candidate for [employer] application.  Message sent to our preauthorization department to see if he can be approved for this -Return to clinic as needed   Dot Gazella, DPM [facility]  Dr. Dot Gazella, DPM    [address]  [place], [place] [address]                Office [phone]  Fax [phone]

## 2024-04-19 NOTE — Addendum Note (Signed)
 Addended by: Tieshia Rettinger D on: 04/19/2024 03:09 PM   Modules accepted: Orders

## 2024-04-20 ENCOUNTER — Ambulatory Visit (INDEPENDENT_AMBULATORY_CARE_PROVIDER_SITE_OTHER): Payer: Medicare Other | Admitting: Vascular Surgery

## 2024-04-20 ENCOUNTER — Ambulatory Visit (HOSPITAL_BASED_OUTPATIENT_CLINIC_OR_DEPARTMENT_OTHER)
Admission: RE | Admit: 2024-04-20 | Discharge: 2024-04-20 | Disposition: A | Discharge status: Home / Self Care | Payer: Medicare Other | Source: Ambulatory Visit | Attending: Vascular Surgery | Admitting: Vascular Surgery

## 2024-04-20 ENCOUNTER — Ambulatory Visit (HOSPITAL_COMMUNITY)
Admission: RE | Admit: 2024-04-20 | Discharge: 2024-04-20 | Disposition: A | Discharge status: Home / Self Care | Payer: Medicare Other | Source: Ambulatory Visit | Attending: Vascular Surgery | Admitting: Vascular Surgery

## 2024-04-20 ENCOUNTER — Encounter: Payer: Self-pay | Admitting: Vascular Surgery

## 2024-04-20 VITALS — BP 149/77 | HR 67 | Temp 98.2°F | Resp 20 | Ht 70.0 in | Wt 213.3 lb

## 2024-04-20 DIAGNOSIS — I7143 Infrarenal abdominal aortic aneurysm, without rupture: Secondary | ICD-10-CM

## 2024-04-20 DIAGNOSIS — I723 Aneurysm of iliac artery: Secondary | ICD-10-CM | POA: Diagnosis not present

## 2024-04-20 DIAGNOSIS — I70212 Atherosclerosis of native arteries of extremities with intermittent claudication, left leg: Secondary | ICD-10-CM

## 2024-04-20 LAB — VAS US ABI WITH/WO TBI
Left ABI: 0.91
Right ABI: 0.96

## 2024-05-03 ENCOUNTER — Encounter: Payer: Self-pay | Admitting: Neurology

## 2024-05-03 ENCOUNTER — Ambulatory Visit (INDEPENDENT_AMBULATORY_CARE_PROVIDER_SITE_OTHER): Payer: Medicare Other | Admitting: Neurology

## 2024-05-03 VITALS — BP 127/71 | HR 71 | Ht 70.0 in | Wt 218.5 lb

## 2024-05-03 DIAGNOSIS — R208 Other disturbances of skin sensation: Secondary | ICD-10-CM

## 2024-05-03 DIAGNOSIS — G629 Polyneuropathy, unspecified: Secondary | ICD-10-CM

## 2024-05-03 NOTE — Patient Instructions (Signed)
Alpha lipoic acid 600 mg twice a day

## 2024-05-03 NOTE — Progress Notes (Signed)
 [facility]  PATIENT: Martin Rubio DOB: [birth year]  REFERRING DOCTOR OR PCP:  Devonna Foley, MD SOURCE: patient, notes from PCP  _________________________________   HISTORICAL  CHIEF COMPLAINT:  Chief Complaint  Patient presents with   Follow-up    Pt in room 10. Alone. Here for Polyneuropathy follow up. Pt said the pain is not so bad, the numbness is what hurts, pt interested in physical therapy if this will help. PCP said vitamin b 12 infusion can help with neuropathy pain, PCP will order.    HISTORY OF PRESENT ILLNESS:  Martin Rubio is an 78 y.o. man with painful dysesthesias.      UPDATE 05/03/2024 At the last visit, we checked B12, SPEP/IEF and ssa/ssb and they were fine.     He continues to have painful dysesthesias.   He has trouble tolerating gabapentin  or Lyrica  due to sleepiness.   Lamotrigine  had not helped.  We discussed  History of polyneuropathy He began to experience dysesthetic pain in his legs x 10 years.    He saw a physician at that time and had a few ESIs without much benefit.    He notes a pins and needles sensation in his toes and a burning sensation in the bottom of the feet.  He denies pain above the ankles.    He notes no definite weakness but the left leg has buckled a few times.   He feels the buckling is more liely due to knee DJD. Aaron Aas  He notes mild LBP chronically. Pain is worse if he leans forward.    He has had soe urinary hesitancy and needed a TURP like procedure  Lyrica  50 mg made him sleepy so he just has taken at night.   He does not recall trying other medications.  He also has noted leg cramps and takes magnesium .   He tries to go to the gym a few times a month and does exercises on machines.    He had a cholecystitis recently.     Lumbar x-rays showed DDD at L3L4, and facet hypertrophy at L4L5, L5S1  He has psoriatic arthritis.      REVIEW OF SYSTEMS: Constitutional: No fevers, chills, sweats, or change in  appetite Eyes: No visual changes, double vision, eye pain Ear, nose and throat: No hearing loss, ear pain, nasal congestion, sore throat Cardiovascular: No chest pain.  He has a pacemaker.   Respiratory:  No shortness of breath at rest or with exertion.   No wheezes GastrointestinaI: No nausea, vomiting, diarrhea, abdominal pain, fecal incontinence Genitourinary: He has urinary hesitancy and urgency.  He sees urology.. Musculoskeletal:  No neck pain, back pain Integumentary: No rash, pruritus, skin lesions Neurological: as above Psychiatric: No depression at this time.  No anxiety Endocrine: No palpitations, diaphoresis, change in appetite, change in weigh or increased thirst Hematologic/Lymphatic:  No anemia, purpura, petechiae. Allergic/Immunologic: No itchy/runny eyes, nasal congestion, recent allergic reactions, rashes  ALLERGIES: Allergies  Allergen Reactions   Codeine     Tension, nasty feeling    Nsaids Other (See Comments)    Renal insufficiency   Prednisone Other (See Comments)    Unknown/ sometimes takes with lower dose   Statins Other (See Comments)    Joint pain    Sulfa Antibiotics Other (See Comments)    Joint pain    HOME MEDICATIONS:  Current Outpatient Medications:    alfuzosin  (UROXATRAL ) 10 MG 24 hr tablet, Take 1 tablet (10 mg total) by mouth daily  with breakfast., Disp: 30 tablet, Rfl: 11   amLODipine  (NORVASC ) 5 MG tablet, Take 1 tablet (5 mg total) by mouth daily., Disp: 90 tablet, Rfl: 2   cholecalciferol  (VITAMIN D3) 25 MCG (1000 UNIT) tablet, Take 2,000 Units by mouth daily in the afternoon., Disp: , Rfl:    Cyanocobalamin  (VITAMIN B 12) 500 MCG TABS, Take 1,000 mcg by mouth daily in the afternoon., Disp: , Rfl:    diclofenac  Sodium (VOLTAREN ) 1 % GEL, Apply 4 g topically in the morning and at bedtime., Disp: 100 g, Rfl: 6   dicyclomine  (BENTYL ) 20 MG tablet, TAKE ONE TABLET BY MOUTH EVERY 6 HOURS AS NEEDED FOR ABDOMINAL CRAMPING/DIARRHEA, Disp: 30  tablet, Rfl: 0   dofetilide  (TIKOSYN ) 250 MCG capsule, TAKE ONE CAPSULE BY MOUTH TWICE A DAY, Disp: 180 capsule, Rfl: 1   Evolocumab  (REPATHA  SURECLICK) 140 MG/ML SOAJ, INJECT 140 MG UNDER THE SKIN EVERY TWO WEEKS INTO THE THIGH, STOMACH, OR UPPER ARM, Disp: 2 mL, Rfl: 11   famotidine  (PEPCID ) 40 MG tablet, Take 1 tablet (40 mg total) by mouth at bedtime., Disp: 90 tablet, Rfl: 1   ferrous fumarate  (FERRETTS) 325 (106 Fe) MG TABS tablet, Take 1 tablet (106 mg of iron total) by mouth 2 (two) times daily. (Patient taking differently: Take 1 tablet by mouth daily.), Disp: 180 tablet, Rfl: 1   fluticasone  (FLONASE ) 50 MCG/ACT nasal spray, Place 1 spray into both nostrils as needed for allergies., Disp: , Rfl:    hydrOXYzine  (VISTARIL ) 25 MG capsule, Take 1 capsule (25 mg total) by mouth every 8 (eight) hours as needed., Disp: 60 capsule, Rfl: 3   levothyroxine  (SYNTHROID ) 137 MCG tablet, Take 1 tablet (137 mcg total) by mouth daily before breakfast., Disp: 90 tablet, Rfl: 1   losartan  (COZAAR ) 50 MG tablet, TAKE ONE TABLET BY MOUTH EVERY MORNING AND TAKE ONE TABLET BY MOUTH AT BEDTIME, Disp: 180 tablet, Rfl: 2   meclizine  (ANTIVERT ) 25 MG tablet, Take 25 mg by mouth 3 (three) times daily as needed for dizziness., Disp: , Rfl:    Melatonin 5 MG TABS, Take 5 mg by mouth at bedtime., Disp: , Rfl:    OVER THE COUNTER MEDICATION, Take 2 capsules by mouth daily in the afternoon. Nervprin - Healthy Nerve Support - Peripheral Neuropathy Relief, Disp: , Rfl:    pantoprazole  (PROTONIX ) 40 MG tablet, Take 1 tablet (40 mg total) by mouth daily., Disp: 90 tablet, Rfl: 1   rivaroxaban  (XARELTO ) 20 MG TABS tablet, Take 1 tablet (20 mg total) by mouth daily with supper., Disp: 90 tablet, Rfl: 1   triamcinolone  cream (KENALOG ) 0.1 %, Apply 1 application  topically daily as needed for itching., Disp: , Rfl:    vardenafil  (LEVITRA ) 20 MG tablet, Take 1 tablet (20 mg total) by mouth daily as needed for erectile dysfunction.,  Disp: 10 tablet, Rfl: 11  PAST MEDICAL HISTORY: Past Medical History:  Diagnosis Date   Bronchitis    Diverticulosis    FH: BPH (benign prostatic hypertrophy)    History of Graves' disease 01/19/2018   S/p ablation   HTN (hypertension)    Hyperlipidemia    Obesity    Psoriatic arthritis (HCC)    Rheumatoid arthritis (HCC) 08/07/2014   Sleep apnea    CPAP   Thyroid  disease     PAST SURGICAL HISTORY: Past Surgical History:  Procedure Laterality Date   ABDOMINAL AORTOGRAM W/LOWER EXTREMITY N/A 01/14/2022   Procedure: ABDOMINAL AORTOGRAM W/LOWER EXTREMITY;  Surgeon: Wenona Hamilton,  MD;  Location: [facility];  Service: Cardiovascular;  Laterality: N/A;   ATRIAL FIBRILLATION ABLATION N/A 12/24/2022   Procedure: ATRIAL FIBRILLATION ABLATION;  Surgeon: Efraim Grange, MD;  Location: [facility];  Service: Cardiovascular;  Laterality: N/A;   CHOLECYSTECTOMY N/A 09/18/2023   Procedure: LAPAROSCOPIC CHOLECYSTECTOMY WITH INTRAOPERATIVE CHOLANGIOGRAM;  Surgeon: Kinsinger, Alphonso Aschoff, MD;  Location: [facility] OR;  Service: General;  Laterality: N/A;   INGUINAL HERNIA REPAIR     bilateral   KNEE SURGERY     bilateral arthroscopic   l foot surgery Left 12/2020   PACEMAKER IMPLANT N/A 12/13/2019   Procedure: PACEMAKER IMPLANT;  Surgeon: Tammie Fall, MD;  Location: [facility];  Service: Cardiovascular;  Laterality: N/A;   TRANSURETHRAL RESECTION OF PROSTATE     UMBILICAL HERNIA REPAIR      FAMILY HISTORY: Family History  Problem Relation Age of Onset   CAD Mother 50       CABG   CAD Father 44       Died age 7   Diabetes Brother    Lung cancer Maternal Grandmother    Prostate cancer Neg Hx    Colon cancer Neg Hx     SOCIAL HISTORY: Social History   Socioeconomic History   Marital status: Married    Spouse name: Not on file   Number of children: 2   Years of education: Not on file   Highest education level: Associate degree: occupational, Scientist, product/process development,  or vocational program  Occupational History   Occupation: Retired    Associate Professor: [employer]  Tobacco Use   Smoking status: Former    Types: Cigarettes   Smokeless tobacco: Never   Tobacco comments:    Former smoker Quit 46 years ago. 01/21/23  Vaping Use   Vaping status: Never Used  Substance and Sexual Activity   Alcohol use: No   Drug use: No   Sexual activity: Not on file  Other Topics Concern   Not on file  Social History Narrative   Lives with wife.   Social Drivers of Corporate investment banker Strain: Low Risk  (04/03/2024)   Overall Financial Resource Strain (CARDIA)    Difficulty of Paying Living Expenses: Not very hard  Food Insecurity: No Food Insecurity (04/03/2024)   Hunger Vital Sign    Worried About Running Out of Food in the Last Year: Never true    Ran Out of Food in the Last Year: Never true  Transportation Needs: No Transportation Needs (04/03/2024)   PRAPARE - Administrator, Civil Service (Medical): No    Lack of Transportation (Non-Medical): No  Physical Activity: Insufficiently Active (04/03/2024)   Exercise Vital Sign    Days of Exercise per Week: 3 days    Minutes of Exercise per Session: 20 min  Stress: No Stress Concern Present (04/03/2024)   [employer] of Occupational Health - Occupational Stress Questionnaire    Feeling of Stress : Not at all  Social Connections: Socially Integrated (04/03/2024)   Social Connection and Isolation Panel [NHANES]    Frequency of Communication with Friends and Family: Three times a week    Frequency of Social Gatherings with Friends and Family: Twice a week    Attends Religious Services: More than 4 times per year    Active Member of [employer] or Organizations: Yes    Attends Engineer, structural: More than 4 times per year    Marital Status: Married  Intimate  Partner Violence: Not At Risk (09/17/2023)   Humiliation, Afraid, Rape, and Kick questionnaire    Fear of Current or Ex-Partner: No     Emotionally Abused: No    Physically Abused: No    Sexually Abused: No       PHYSICAL EXAM  Vitals:   05/03/24 1505  BP: 127/71  Pulse: 71  SpO2: 98%  Weight: 218 lb 8 oz (99.1 kg)  Height: 5\' 10"  (1.778 m)    Body mass index is 31.35 kg/m.   General: The patient is well-developed and well-nourished and in no acute distress  HEENT:  Head is [place]/AT.  Sclera are anicteric.     Skin: Extremities are without rash or  edema.    Neurologic Exam  Mental status: The patient is alert and oriented x 3 at the time of the examination. The patient has apparent normal recent and remote memory, with an apparently normal attention span and concentration ability.   Speech is normal.  Cranial nerves: Extraocular movements are full.   There is good facial sensation to soft touch bilaterally.Facial strength is normal.  Trapezius and sternocleidomastoid strength is normal. No dysarthria is noted.  He has mildly reduced hearing.  Motor:  Muscle bulk is normal.   Tone is normal. Strength is  5 / 5 in all 4 extremities.   Sensory: Sensory testing is intact to pinprick, soft touch and vibration sensation in the arms.   Normal pinprick in feet but 25-30% vibration sensation at ankle and absent at toes  Coordination: Cerebellar testing reveals good finger-nose-finger and heel-to-shin bilaterally.  Gait and station: Station is normal.   Gait is normal. Tandem gait is slightly wide but normal for age. Romberg is negative.   Reflexes: Deep tendon reflexes are symmetric and normal bilaterally.   Plantar responses are flexor.    DIAGNOSTIC DATA (LABS, IMAGING, TESTING) - I reviewed patient records, labs, notes, testing and imaging myself where available.  Lab Results  Component Value Date   WBC 5.1 03/15/2024   HGB 16.0 03/15/2024   HCT 49 03/15/2024   MCV 84.7 09/22/2023   PLT 171 03/15/2024      Component Value Date/Time   NA 142 04/17/2024 1148   K 4.7 04/17/2024 1148   CL 106  04/17/2024 1148   CO2 20 04/17/2024 1148   GLUCOSE 96 04/17/2024 1148   GLUCOSE 97 09/22/2023 1046   BUN 15 04/17/2024 1148   CREATININE 1.57 (H) 04/17/2024 1148   CREATININE 1.02 01/22/2017 1503   CALCIUM 9.4 04/17/2024 1148   PROT 6.7 10/20/2023 1403   ALBUMIN 4.2 03/15/2024 0000   ALBUMIN 4.1 11/01/2017 0939   AST 27 03/15/2024 0000   ALT 24 03/15/2024 0000   ALKPHOS 88 03/15/2024 0000   BILITOT 0.8 09/22/2023 1046   BILITOT 0.5 11/01/2017 0939   GFRNONAA 46 (L) 09/22/2023 1046   GFRAA 56 (L) 10/18/2020 1141   Lab Results  Component Value Date   CHOL 122 09/17/2023   HDL 36 (L) 09/17/2023   LDLCALC 61 09/17/2023   TRIG 123 09/17/2023   CHOLHDL 3.4 09/17/2023   Lab Results  Component Value Date   HGBA1C 5.5 05/10/2023   Lab Results  Component Value Date   VITAMINB12 553 04/04/2024   Lab Results  Component Value Date   TSH 1.60 04/04/2024       ASSESSMENT AND PLAN  Polyneuropathy  Dysesthesia  Etiology of polyneuropathy not determined.    He did not tolerate gabapentin  or Lyrica   well.    Lamotrigine  had not helped.  As he has BPH so will avoid duloxetine. Trial of alpha lipoic acid 600 mg bid.   If not better after a month consider lacosamide 50 or 100 mg po bid or oxcarbazepine 300 mg 2-3 times a day 3.   He will return to see me in 12 months or sooner if there are new or worsening neurologic symptoms.   This visit is part of a comprehensive longitudinal care medical relationship regarding the patients primary diagnosis of polyneuropathy and related concerns.   Shelda Truby A. Godwin Lat, MD, PhD, Terral Ferrari 05/03/2024, 3:55 PM Certified in Neurology, Clinical Neurophysiology, Sleep Medicine and Neuroimaging  [facility] [address] [place], [place] [address] [phone]

## 2024-05-03 NOTE — Addendum Note (Signed)
 Addended by: Edra Govern D on: 05/03/2024 03:38 PM   Modules accepted: Orders

## 2024-05-03 NOTE — Progress Notes (Signed)
 Remote pacemaker transmission.

## 2024-05-04 ENCOUNTER — Ambulatory Visit (INDEPENDENT_AMBULATORY_CARE_PROVIDER_SITE_OTHER): Payer: Medicare Other | Admitting: Gastroenterology

## 2024-05-04 ENCOUNTER — Telehealth: Payer: Self-pay

## 2024-05-04 ENCOUNTER — Encounter: Payer: Self-pay | Admitting: Gastroenterology

## 2024-05-04 ENCOUNTER — Other Ambulatory Visit: Payer: Self-pay | Admitting: Physician Assistant

## 2024-05-04 VITALS — BP 120/80 | Ht 70.0 in | Wt 212.0 lb

## 2024-05-04 DIAGNOSIS — Z7901 Long term (current) use of anticoagulants: Secondary | ICD-10-CM

## 2024-05-04 DIAGNOSIS — K9089 Other intestinal malabsorption: Secondary | ICD-10-CM

## 2024-05-04 DIAGNOSIS — Z9049 Acquired absence of other specified parts of digestive tract: Secondary | ICD-10-CM | POA: Diagnosis not present

## 2024-05-04 DIAGNOSIS — R1013 Epigastric pain: Secondary | ICD-10-CM

## 2024-05-04 DIAGNOSIS — I4891 Unspecified atrial fibrillation: Secondary | ICD-10-CM | POA: Diagnosis not present

## 2024-05-04 DIAGNOSIS — K219 Gastro-esophageal reflux disease without esophagitis: Secondary | ICD-10-CM | POA: Diagnosis not present

## 2024-05-04 DIAGNOSIS — K760 Fatty (change of) liver, not elsewhere classified: Secondary | ICD-10-CM

## 2024-05-04 DIAGNOSIS — I480 Paroxysmal atrial fibrillation: Secondary | ICD-10-CM

## 2024-05-04 NOTE — Telephone Encounter (Signed)
 [facility] Pre-operative Risk Assessment     Request for surgical clearance:     Endoscopy Procedure  What type of surgery is being performed?     Upper endoscopy  When is this surgery scheduled?     06-07-24  What type of clearance is required ?   Pharmacy  Are there any medications that need to be held prior to surgery and how long? Xarelto  x 2 days  Practice name and name of physician performing surgery?      [facility]  What is your office phone and fax number?      Phone- [phone]  Fax- [phone]  Anesthesia type (None, local, MAC, general) ?       [facility]   Please route your response to Angelene Barbone, CMA

## 2024-05-04 NOTE — Progress Notes (Signed)
 Chief Complaint:    Abdominal pain, GERD  GI History: 78 y.o. male with a past medical history of complete heart block s/p pacemaker placement in 2021, A-fib s/p ablation 12/24/2022 (on xarelto ), HTN, HLD, PUD, Graves' disease, OSA (on CPAP), rheumatoid arthritis, psoriatic arthritis, BPH with LUTS, CKD 3, who follows in the GI clinic for RUQ pain and GERD.   Previous followed at [facility]. - 06/24/2018: EGD: Normal esophagus with benign esophageal biopsies - 09/01/2021: Evaluation for IDA and recommended EGD and colonoscopy.  Reports not available for review - 03/25/2022: VCE: Normal   - 09/16/2023-09/19/2023 patient admitted to the hospital for acute gallstone pancreatitis.  Patient initially had a CT with no evidence of cholecystitis, LFTs elevated initially but improved.  Seen by general surgery and underwent lap cholecystectomy with IOC with no evidence of choledocholithiasis.  Did well postoperatively and was cleared for discharge. - 09/22/2023 office visit with PCP for generalized abdominal pain, had been on Oxycodone  and had not had a bowel movement since before his surgery.  Then had trouble urinating.  Patient had a DRE with abundant stools.  Postop patient had urinary retention from fecal impaction and possibly early SBO.  Patient sent to the ER. - 09/22/2023 ER visit with platelets of 134, creatinine 1.54.  Bladder scan revealed 400 cc of urine.  Patient had a In-N-Out catheter placed.  Patient started having bowel movements.  Abdominal pain resolved. - 09/27/2023 patient seen by urology who did a bladder catheterization for benign prostatic hyperplasia with urinary retention.  Foley was then removed.  Patient told to take Flomax . - 09/28/2023 office visit with Reginal Capra, PA-C for reflux and to establish care, continued on pantoprazole  40 mg daily, Pepcid  at night and as needed. - 01/13/2024 RUQ US   for abdominal pain postcholecystectomy showed increased hepatic echotexture  most likely steatosis surgically absent gallbladder no biliary dilation. -01/25/2024 follow-up visit with Santina Cull, PA-C.  Had been having diarrhea since his cholecystectomy, treating with OTC antidiarrheal medications, fiber supplement, and dicyclomine .  Was also having RUQ pain and GERD.  Recommended OTC lidocaine  patches for pain, continued pantoprazole , added Pepcid  at nighttime  HPI:     Patient is a 78 y.o. male presenting to the Gastroenterology Clinic for follow-up.  Was last seen by me in the office on 01/27/2024 for evaluation of RUQ pain starting after cholecystectomy in October, along with reflux.  Offered EGD, but patient declined.  Abdominal x-ray in March with mild scattered colonic stool and stool in the rectum and was started on MiraLAX  on demand.  Elastography and March with median kPA at 5 which is at the borderline of being normal.  Reflux is much better.  Still taking Protonix  and Pepcid  along with ginger candy on demand which works well for reflux and dyspepsia.   RUQ pain has since resolved.   Now with occasional epigastric pain and dyspepsia. This also tends to improve with his ginger candy.   No lower GI sxs. Normal bowel habits and no longer using Miralax .    Review of systems:     No chest pain, no SOB, no fevers, no urinary sx   Past Medical History:  Diagnosis Date   Bronchitis    Diverticulosis    FH: BPH (benign prostatic hypertrophy)    History of Graves' disease 01/19/2018   S/p ablation   HTN (hypertension)    Hyperlipidemia    Obesity    Psoriatic arthritis (HCC)    Rheumatoid arthritis (HCC)  08/07/2014   Sleep apnea    CPAP   Thyroid  disease     Patient's surgical history, family medical history, social history, medications and allergies were all reviewed in Epic    Current Outpatient Medications  Medication Sig Dispense Refill   alfuzosin  (UROXATRAL ) 10 MG 24 hr tablet Take 1 tablet (10 mg total) by mouth daily with breakfast. 30  tablet 11   amLODipine  (NORVASC ) 5 MG tablet Take 1 tablet (5 mg total) by mouth daily. 90 tablet 2   cholecalciferol  (VITAMIN D3) 25 MCG (1000 UNIT) tablet Take 2,000 Units by mouth daily in the afternoon.     Cyanocobalamin  (VITAMIN B 12) 500 MCG TABS Take 1,000 mcg by mouth daily in the afternoon.     diclofenac  Sodium (VOLTAREN ) 1 % GEL Apply 4 g topically in the morning and at bedtime. 100 g 6   dicyclomine  (BENTYL ) 20 MG tablet TAKE ONE TABLET BY MOUTH EVERY 6 HOURS AS NEEDED FOR ABDOMINAL CRAMPING/DIARRHEA 30 tablet 0   dofetilide  (TIKOSYN ) 250 MCG capsule TAKE ONE CAPSULE BY MOUTH TWICE A DAY 180 capsule 1   Evolocumab  (REPATHA  SURECLICK) 140 MG/ML SOAJ INJECT 140 MG UNDER THE SKIN EVERY TWO WEEKS INTO THE THIGH, STOMACH, OR UPPER ARM 2 mL 11   famotidine  (PEPCID ) 40 MG tablet Take 1 tablet (40 mg total) by mouth at bedtime. 90 tablet 1   ferrous fumarate  (FERRETTS) 325 (106 Fe) MG TABS tablet Take 1 tablet (106 mg of iron total) by mouth 2 (two) times daily. (Patient taking differently: Take 1 tablet by mouth daily.) 180 tablet 1   fluticasone  (FLONASE ) 50 MCG/ACT nasal spray Place 1 spray into both nostrils as needed for allergies.     hydrOXYzine  (VISTARIL ) 25 MG capsule Take 1 capsule (25 mg total) by mouth every 8 (eight) hours as needed. 60 capsule 3   levothyroxine  (SYNTHROID ) 137 MCG tablet Take 1 tablet (137 mcg total) by mouth daily before breakfast. 90 tablet 1   losartan  (COZAAR ) 50 MG tablet TAKE ONE TABLET BY MOUTH EVERY MORNING AND TAKE ONE TABLET BY MOUTH AT BEDTIME 180 tablet 2   meclizine  (ANTIVERT ) 25 MG tablet Take 25 mg by mouth 3 (three) times daily as needed for dizziness.     Melatonin 5 MG TABS Take 5 mg by mouth at bedtime.     OVER THE COUNTER MEDICATION Take 2 capsules by mouth daily in the afternoon. Nervprin - Healthy Nerve Support - Peripheral Neuropathy Relief     pantoprazole  (PROTONIX ) 40 MG tablet Take 1 tablet (40 mg total) by mouth daily. 90 tablet 1    rivaroxaban  (XARELTO ) 20 MG TABS tablet Take 1 tablet (20 mg total) by mouth daily with supper. 90 tablet 1   triamcinolone  cream (KENALOG ) 0.1 % Apply 1 application  topically daily as needed for itching.     vardenafil  (LEVITRA ) 20 MG tablet Take 1 tablet (20 mg total) by mouth daily as needed for erectile dysfunction. 10 tablet 11   No current facility-administered medications for this visit.    Physical Exam:     BP 120/80   Ht 5\' 10"  (1.778 m)   Wt 212 lb (96.2 kg)   BMI 30.42 kg/m   GENERAL:  Pleasant male in NAD PSYCH: : Cooperative, normal affect CARDIAC:  RRR, no murmur heard, no peripheral edema PULM: Normal respiratory effort, lungs CTA bilaterally, no wheezing ABDOMEN:  Nondistended, soft, nontender. No obvious masses, no hepatomegaly,  normal bowel sounds SKIN:  turgor, no  lesions seen Musculoskeletal:  Normal muscle tone, normal strength NEURO: Alert and oriented x 3, no focal neurologic deficits   IMPRESSION and PLAN:    1) GERD - Continue Protonix  and Pepcid  - Continue ginger candy on demand as that seems to help with any breakthrough reflux symptoms - Continue antireflux lifestyle/dietary modifications - Plan for EGD to evaluate for erosive esophagitis, Barrett's Esophagus screening, etc  2) Dyspepsia 3) Epigastric pain - EGD to evaluate for PUD, gastritis, H. pylori - Continue current therapy  4) Bile salt diarrhea Was having loose stools after cholecystectomy in 09/2023, but those symptoms have now resolved - If symptomatic recurrence, can trial cholestyramine - Continue high-fiber diet  5) Atrial fibrillation 6) Chronic anticoagulation - Hold Xarelto  2 days before procedure - will instruct when and how to resume after procedure. Low but real risk of cardiovascular event such as heart attack, stroke, embolism, thrombosis or ischemia/infarct of other organs off Xarelto  explained and need to seek urgent help if this occurs. The patient consents to  proceed. Will communicate by phone or EMR with patient's prescribing provider to confirm that holding Xarelto  is reasonable in this case    7) Hepatic steatosis Fatty liver infiltration noted on recent ultrasound.  Extended serologic workup otherwise unrevealing.  Ultrasound elastography essentially normal in 02/2024.  Does have mild thrombocytopenia, but otherwise most recent set of liver enzymes with normal albumin, AST/ALT, ALP, T. bili. - Close follow-up with his PCP and Cardiologist for continued aggressive monitoring/treatment of underlying comorbidities     The indications, risks, and benefits of EGD were explained to the patient in detail. Risks include but are not limited to bleeding, perforation, adverse reaction to medications, and cardiopulmonary compromise. Sequelae include but are not limited to the possibility of surgery, hospitalization, and mortality. The patient verbalized understanding and wished to proceed. All questions answered, referred to scheduler. Further recommendations pending results of the exam.        Annis Kinder ,DO, FACG 05/04/2024, 10:11 AM

## 2024-05-04 NOTE — Patient Instructions (Addendum)
 _______________________________________________________  If your blood pressure at your visit was 140/90 or greater, please contact your primary care physician to follow up on this. _______________________________________________________  If you are age 80 or older, your body mass index should be between 23-30. Your Body mass index is 30.42 kg/m. If this is out of the aforementioned range listed, please consider follow up with your Primary Care Provider. ________________________________________________________  The [facility] providers would like to encourage you to use MYCHART to communicate with providers for non-urgent requests or questions.  Due to long hold times on the telephone, sending your provider a message by [facility] may be a faster and more efficient way to get a response.  Please allow 48 business hours for a response.  Please remember that this is for non-urgent requests.  _______________________________________________________  Elene Griffes have been scheduled for an endoscopy. Please follow written instructions given to you at your visit today.  If you use inhalers (even only as needed), please bring them with you on the day of your procedure.  If you take any of the following medications, they will need to be adjusted prior to your procedure:   DO NOT TAKE 7 DAYS PRIOR TO TEST- Trulicity (dulaglutide) Ozempic, Wegovy (semaglutide) Mounjaro (tirzepatide) Bydureon Bcise (exanatide extended release)  DO NOT TAKE 1 DAY PRIOR TO YOUR TEST Rybelsus (semaglutide) Adlyxin (lixisenatide) Victoza (liraglutide) Byetta (exanatide) ___________________________________________________________________________  Due to recent changes in healthcare laws, you may see the results of your imaging and laboratory studies on MyChart before your provider has had a chance to review them.  We understand that in some cases there may be results that are confusing or concerning to you. Not all laboratory  results come back in the same time frame and the provider may be waiting for multiple results in order to interpret others.  Please give us  48 hours in order for your provider to thoroughly review all the results before contacting the office for clarification of your results.   It was a pleasure to see you today!  Vito Cirigliano, D.O.

## 2024-05-05 NOTE — Telephone Encounter (Signed)
   Patient Name: Martin Rubio  DOB: 30-Dec-1945 MRN: [id number]  Primary Cardiologist: Eilleen Grates, MD  Clinical pharmacists have reviewed the patient's past medical history, labs, and current medications as part of preoperative protocol coverage. The following recommendations have been made:   Patient with diagnosis of PAF on Xarelto  for anticoagulation.     Procedure: Upper endoscopy  Date of procedure: 06/07/24     CHA2DS2-VASc Score = 4  This indicates a 4.8% annual risk of stroke. The patient's score is based upon: CHF History: 0 HTN History: 1 Diabetes History: 0 Stroke History: 0 Vascular Disease History: 1 Age Score: 2 Gender Score: 0     CrCl 52 ml/min Platelet count 134K   Per office protocol, patient can hold Xarelto  for 2 days prior to procedure.        I will route this recommendation to the requesting party via Epic fax function and remove from pre-op  pool.  Please call with questions.  Friddie Jetty, NP 05/05/2024, 10:06 AM

## 2024-05-05 NOTE — Telephone Encounter (Signed)
 Patient advised that he has been given clearance to hold Xarelto  2 days prior to EGD scheduled for 06-07-24.  Patient advised to take last dose of Xarelto  on 06-04-24, and he will be advised when to restart Xarelto  by Dr Karene Oto after the procedure.  Patient agreed to plan and verbalized understanding.  No further questions.

## 2024-05-05 NOTE — Telephone Encounter (Signed)
 Patient with diagnosis of PAF on Xarelto  for anticoagulation.    Procedure: Upper endoscopy  Date of procedure: 06/07/24   CHA2DS2-VASc Score = 4  This indicates a 4.8% annual risk of stroke. The patient's score is based upon: CHF History: 0 HTN History: 1 Diabetes History: 0 Stroke History: 0 Vascular Disease History: 1 Age Score: 2 Gender Score: 0    CrCl 52 ml/min Platelet count 134K  Per office protocol, patient can hold Xarelto  for 2 days prior to procedure.      **This guidance is not considered finalized until pre-operative APP has relayed final recommendations.**

## 2024-05-05 NOTE — Telephone Encounter (Signed)
 Left message for patient to return call to further discuss Xarelto hold.  Will continue efforts.

## 2024-05-12 ENCOUNTER — Ambulatory Visit

## 2024-05-12 DIAGNOSIS — E538 Deficiency of other specified B group vitamins: Secondary | ICD-10-CM

## 2024-05-12 DIAGNOSIS — G629 Polyneuropathy, unspecified: Secondary | ICD-10-CM | POA: Diagnosis not present

## 2024-05-12 MED ORDER — CYANOCOBALAMIN 1000 MCG/ML IJ SOLN
1000.0000 ug | Freq: Once | INTRAMUSCULAR | Status: AC
  Administered 2024-05-12: 1000 ug via INTRAMUSCULAR

## 2024-05-12 NOTE — Progress Notes (Signed)
 Pt here for monthly B12 injection per original order dated: 04/04/24, per Dr. Neomi Banks I recommend to start B12 shots monthly to see if that helps your neuropathy.   First B12 injection:6/6//25  Last B12 level: 04/04/24   B12 1000mcg given IM, left deltoid and pt tolerated injection well.  Next B12 injection scheduled for: 06/14/24

## 2024-05-19 ENCOUNTER — Other Ambulatory Visit: Payer: Self-pay | Admitting: Cardiovascular Disease

## 2024-05-22 ENCOUNTER — Ambulatory Visit (INDEPENDENT_AMBULATORY_CARE_PROVIDER_SITE_OTHER): Admitting: Orthopedic Surgery

## 2024-05-22 ENCOUNTER — Other Ambulatory Visit (INDEPENDENT_AMBULATORY_CARE_PROVIDER_SITE_OTHER): Payer: Self-pay

## 2024-05-22 DIAGNOSIS — M79641 Pain in right hand: Secondary | ICD-10-CM

## 2024-05-22 DIAGNOSIS — M180 Bilateral primary osteoarthritis of first carpometacarpal joints: Secondary | ICD-10-CM | POA: Diagnosis not present

## 2024-05-22 DIAGNOSIS — M65331 Trigger finger, right middle finger: Secondary | ICD-10-CM

## 2024-05-22 MED ORDER — BETAMETHASONE SOD PHOS & ACET 6 (3-3) MG/ML IJ SUSP
6.0000 mg | INTRAMUSCULAR | Status: AC | PRN
  Administered 2024-05-22: 6 mg via INTRA_ARTICULAR

## 2024-05-22 MED ORDER — LIDOCAINE HCL 1 % IJ SOLN
1.0000 mL | INTRAMUSCULAR | Status: AC | PRN
Start: 2024-05-22 — End: 2024-05-22
  Administered 2024-05-22: 1 mL

## 2024-05-22 NOTE — Progress Notes (Signed)
 Martin Rubio - 78 y.o. male MRN [id number]  Date of birth: Oct 05, 1946  Office Visit Note: Visit Date: 05/22/2024 PCP: Ezell Hollow, MD Referred by: Ezell Hollow, MD  Subjective: No chief complaint on file.  HPI: Martin Rubio is a pleasant 78 y.o. male who presents today for multiple complaints.  He is describing pain at the bilateral thumb basilar joints which been present for multiple years.  Is also describing right long finger with notable triggering and tenderness at the volar base.  States that the triggering is been present now for multiple months, worsening in nature.  He is describing occasional need for manual correction of the long finger as well with notable locking.  Denies any major numbness or tingling.  He is overall active at baseline.  Has not undergone any formalized treatments for the bilateral thumbs or the right long finger trigger digit.  Feels that he may have undergone prior left hand trigger digit surgery years ago, is unsure of which digit.  Pertinent ROS were reviewed with the patient and found to be negative unless otherwise specified above in HPI.   Visit Reason:right long finger trigger, bilateral thumb CMC arthritis Duration of symptoms: Multiple months Hand dominance: right Occupation:retired Diabetic: No Smoking: No Heart/Lung History: Pacemaker Blood Thinners: Xarelto   Prior Testing/EMG:none Injections (Date):none Treatments:tried wrist brace and enbril inj for OA Prior Surgery:none  Assessment & Plan: Visit Diagnoses:  1. Trigger finger, right middle finger   2. Pain in right hand   3. Primary osteoarthritis of both first carpometacarpal joints     Plan: Extensive discussion was had with the patient today regarding his right long finger trigger digit.  We discussed the etiology and pathophysiology of stenosing tenosynovitis.  We discussed conservative versus surgical treatment modalities.  From a conservative standpoint, we discussed  activity modification, splinting, therapy and injections.  From a surgical standpoint, we discussed the possibility for trigger digit release as well as all risk and benefits associated.  Given that he has not trialed conservative treatments, patient is appropriate candidate for cortisone injection to the right long finger finger A1 pulley for symptom relief.  Risks and benefit of the cortisone injection were discussed in detail, patient agreed to proceed.  Injection was provided today without issue, patient will return in approximate 6 weeks time for a recheck.  Extensive discussion was had with the patient today regarding his bilateral thumb basilar joint pain.  X-rays today confirm diagnosis of ongoing right thumb CMC arthritis which correlates with clinical examination.  We reviewed the etiology and pathophysiology of this condition as well as appropriate treatment modalities ranging from conservative to surgical.  From a conservative standpoint, we discussed bracing, activity modification, nonsteroidal anti-inflammatory medications both oral and topical, and finally cortisone injections.  From surgical standpoint, we discussed the possibility for thumb [facility] arthroplasty in the future should symptoms refractory to conservative care.  I discussed the surgical treatment as well as the postop protocol in detail with him as well today for understanding.  At this juncture, given that he has not undergone any formalized treatment, we will have him fitted for bilateral Comfort Cool brace for symptom relief.  At his follow-up for the trigger digit, we can discuss the thumb CMC arthritis further.  At that juncture, we can also obtain x-rays of the left wrist with [facility] views.  I spent 45 minutes in the care of this patient today including review of previous documentation, imaging obtained, face-to-face time discussing  all options regarding treatment and documenting the encounter.     Follow-up: No follow-ups  on file.   Meds & Orders: No orders of the defined types were placed in this encounter.   Orders Placed This Encounter  Procedures   XR Hand Complete Right   XR Wrist Complete Right     Procedures: Hand/UE Inj: R long A1 for trigger finger on 05/22/2024 1:58 PM Indications: pain Details: 25 G needle, volar approach Medications: 1 mL lidocaine  1 %; 6 mg betamethasone  acetate-betamethasone  sodium phosphate  6 (3-3) MG/ML Outcome: tolerated well, no immediate complications Procedure, treatment alternatives, risks and benefits explained, specific risks discussed. Consent was given by the patient. Patient was prepped and draped in the usual sterile fashion.          Clinical History: No specialty comments available.  He reports that he has quit smoking. His smoking use included cigarettes. He has never used smokeless tobacco. No results for input(s): HGBA1C, LABURIC in the last 8760 hours.  Objective:   Vital Signs: There were no vitals taken for this visit.  Physical Exam  Gen: Well-appearing, in no acute distress; non-toxic CV: Regular Rate. Well-perfused. Warm.  Resp: Breathing unlabored on room air; no wheezing. Psych: Fluid speech in conversation; appropriate affect; normal thought process  Ortho Exam General: Patient is well appearing and in no distress. Cervical spine mobility is full in all directions:   Skin and Muscle: No skin changes are apparent to upper extremities.  Muscle bulk and contour normal, no signs of atrophy.      Range of Motion and Palpation Tests: Mobility is full about the elbows with flexion and extension.  Forearm supination and pronation are 85/85 bilaterally.  Wrist flexion/extension is 75/65 bilaterally.  Digital flexion and extension are full.  Thumb opposition is full to the base of the small fingers bilaterally.     Palpable nodule right long finger A1 pulley region with associated tenderness.  Notable clicking and locking with deep  flexion.   Significant tenderness over the bilateral thumb CMC articulation is observed, positive grind for pain, positive crepitus.  MP hyperextension negative bilaterally.    Finklestein test mildly positive bilaterally   Neurologic, Vascular, Motor: Sensation is intact to light touch in the median/radial/ulnar distributions.  Tinel's testing negative at wrist level. Fingers pink and well perfused.  Capillary refill is brisk.     Imaging: XR Hand Complete Right Result Date: 05/22/2024 There is no evidence of fracture or dislocation. Soft tissues are unremarkable.   XR Wrist Complete Right Result Date: 05/22/2024 X-rays of the right wrist including pantrapezial view demonstrate significant degenerative changes of the thumb CMC interval with joint space narrowing, osteophyte formation and subchondral sclerosis.   Past Medical/Family/Surgical/Social History: Medications & Allergies reviewed per EMR, new medications updated. Patient Active Problem List   Diagnosis Date Noted   B12 deficiency 04/05/2024   Obesity (BMI 30-39.9) 09/17/2023   Acute gallstone pancreatitis 09/16/2023   Benign paroxysmal positional vertigo 06/11/2023   Aortic atherosclerosis (HCC) 03/18/2021   Bruit 02/09/2021   Aortic valve sclerosis 02/09/2021   PCP notes >>>>>>>>>>>>>>> 01/10/2020   Age related osteoporosis 01/10/2020   CKD (chronic kidney disease), stage IV (HCC) 12/16/2019   Pacemaker 12/16/2019   Sick sinus syndrome (HCC) 12/16/2019   Paroxysmal atrial fibrillation (HCC) 10/26/2019   Nodule of flexor tendon sheath 04/13/2019   Hemorrhoids 06/24/2018   Low bone mass 01/19/2018   Iliac aneurysm (HCC) 06/16/2017   Hypertrophic cardiomyopathy (HCC) 06/16/2017   Essential  hypertension 12/16/2016   Hyperlipidemia LDL goal <100 12/16/2016   Asthma in adult, mild intermittent, uncomplicated 10/12/2016   Collapsed vertebra, not elsewhere classified, cervical region, initial encounter for fracture (HCC)  10/08/2016   Annual physical exam 10/08/2016   Neuropathy 10/08/2016   Contracture of ankle and foot joint 08/07/2014   Esophageal reflux 08/07/2014   Organic impotence 08/07/2014   Obstructive sleep apnea 08/07/2014   Osteoarthritis 08/07/2014   Sensorineural hearing loss 08/07/2014   Tinnitus 08/07/2014   LVH (left ventricular hypertrophy) 12/25/2012   Diverticulosis 09/01/2011   Hypothyroidism 06/09/2011   Postablative hypothyroidism 06/09/2011   Psoriatic arthritis (HCC) 06/09/2011   BPH with obstruction/lower urinary tract symptoms 06/09/2011   Past Medical History:  Diagnosis Date   Bronchitis    Diverticulosis    FH: BPH (benign prostatic hypertrophy)    History of Graves' disease 01/19/2018   S/p ablation   HTN (hypertension)    Hyperlipidemia    Obesity    Psoriatic arthritis (HCC)    Rheumatoid arthritis (HCC) 08/07/2014   Sleep apnea    CPAP   Thyroid  disease    Family History  Problem Relation Age of Onset   CAD Mother 37       CABG   CAD Father 47       Died age 59   Diabetes Brother    Lung cancer Maternal Grandmother    Prostate cancer Neg Hx    Colon cancer Neg Hx    Past Surgical History:  Procedure Laterality Date   ABDOMINAL AORTOGRAM W/LOWER EXTREMITY N/A 01/14/2022   Procedure: ABDOMINAL AORTOGRAM W/LOWER EXTREMITY;  Surgeon: Wenona Hamilton, MD;  Location: [facility];  Service: Cardiovascular;  Laterality: N/A;   ATRIAL FIBRILLATION ABLATION N/A 12/24/2022   Procedure: ATRIAL FIBRILLATION ABLATION;  Surgeon: Efraim Grange, MD;  Location: [facility];  Service: Cardiovascular;  Laterality: N/A;   CHOLECYSTECTOMY N/A 09/18/2023   Procedure: LAPAROSCOPIC CHOLECYSTECTOMY WITH INTRAOPERATIVE CHOLANGIOGRAM;  Surgeon: Kinsinger, Alphonso Aschoff, MD;  Location: [facility] OR;  Service: General;  Laterality: N/A;   INGUINAL HERNIA REPAIR     bilateral   KNEE SURGERY     bilateral arthroscopic   l foot surgery Left 12/2020   PACEMAKER IMPLANT  N/A 12/13/2019   Procedure: PACEMAKER IMPLANT;  Surgeon: Tammie Fall, MD;  Location: [facility];  Service: Cardiovascular;  Laterality: N/A;   TRANSURETHRAL RESECTION OF PROSTATE     UMBILICAL HERNIA REPAIR     Social History   Occupational History   Occupation: Retired    Associate Professor: [employer]  Tobacco Use   Smoking status: Former    Types: Cigarettes   Smokeless tobacco: Never   Tobacco comments:    Former smoker Quit 46 years ago. 01/21/23  Vaping Use   Vaping status: Never Used  Substance and Sexual Activity   Alcohol use: No   Drug use: No   Sexual activity: Not on file    Courtez Twaddle Merlinda Starling) Marce Sensing, M.D. [facility], Hand Surgery

## 2024-05-29 ENCOUNTER — Encounter: Payer: Self-pay | Admitting: Urology

## 2024-05-29 ENCOUNTER — Ambulatory Visit (INDEPENDENT_AMBULATORY_CARE_PROVIDER_SITE_OTHER): Admitting: Urology

## 2024-05-29 VITALS — BP 108/67 | HR 75 | Ht 70.0 in | Wt 209.0 lb

## 2024-05-29 DIAGNOSIS — N401 Enlarged prostate with lower urinary tract symptoms: Secondary | ICD-10-CM

## 2024-05-29 DIAGNOSIS — Z87898 Personal history of other specified conditions: Secondary | ICD-10-CM

## 2024-05-29 DIAGNOSIS — N529 Male erectile dysfunction, unspecified: Secondary | ICD-10-CM | POA: Diagnosis not present

## 2024-05-29 DIAGNOSIS — N138 Other obstructive and reflux uropathy: Secondary | ICD-10-CM

## 2024-05-29 MED ORDER — SILDENAFIL CITRATE 20 MG PO TABS: ORAL_TABLET | ORAL | 11 refills | Status: AC

## 2024-05-29 NOTE — Progress Notes (Signed)
 Assessment: 1. BPH with obstruction/lower urinary tract symptoms   2. History of urinary retention   3. Organic impotence     Plan: Continue alfuzosin  10 mg daily  Continue vardenafil  20 mg prn.  He would like to continue to use sildenafil  20 mg as needed along with the vardenafil .  I advised caution with combining medications but with this low dose this is probably reasonable. Return to office in 6 months  Chief Complaint:  Chief Complaint  Patient presents with   Benign Prostatic Hypertrophy    History of Present Illness:  Martin Rubio is a 78 y.o. male who is seen for continued evaluation of BPH with obstruction, lower urinary tract symptoms, and history of urinary retention. He was previously followed at [facility] in [facility].  His last visit was in January 2022.  Urologic History: History of BPH with obstruction.  He is status post a greenlight PVP on 06/01/2013.  His urinary symptoms improved significantly following the procedure.  Cystoscopy in September 2014 showed a widely patent prostatic urethra.  At his visit in 1/22, he noted some slight increase in his urinary symptoms with decreased dream, urgency, and nocturia.  He was not having any dysuria or gross hematuria. IPSS = 14.  PSA results: 11/18 1.37 11/19 1.80 12/20 0.93 1/22 1.48  He has history of erectile dysfunction.  He has previously used tadalafil 5 mg daily as well as generic sildenafil .  He reported some flushing with 60 mg of sildenafil .  He has most recently been using tadalafil 20 mg as needed.  He has been managed with tamsulosin  with fairly good control of his urinary symptoms.  He was given a trial of Myrbetriq in April 2023.  He was changed to oxybutynin  XL 10 mg daily due to cost of Myrbetriq.  He was referred to nephrology for chronic kidney disease.  CT imaging from 2023 showed mild renal atrophy without hydronephrosis.  Renal ultrasound in July 2023 showed bilateral renal  calculi.  KUB from 7/23 showed no obvious stones.  He had an episode of urinary retention following laparoscopic cholecystectomy on 09/18/2023.  A Foley catheter was left in place and he was taking tamsulosin .  His catheter was removed after a voiding trial on 09/27/2023. He was seen in urgent care on 09/30/2023 with dysuria.  Urinalysis was unremarkable. He was voiding spontaneously after the catheter was removed.  The dysuria improved.  He continued with urgency and some hesitancy.  No gross hematuria or flank pain.  He continued on tamsulosin  and oxybutynin  XL. IPSS = 13. PVR = 90 ml.  At his visit in November 2024, he continued on tamsulosin .  He reported voiding with a good stream.  No dysuria.  His main complaint was nocturia 1-2 times with urgency, primarily at night. IPSS = 12. PVR = 0 ml He was given a trial of trospium  20 mg nightly.  At his visit in January 2025, he continued on tamsulosin .  He was taking the trospium  20 mg at night but had not seen any significant change in his symptoms.  He continued to have frequency, urgency, slowing of his stream. IPSS = 14 QOL = 4/6. PVR = 75 mL. The trospium  was discontinued and he was given samples of Gemtesa .  He was also changed from tamsulosin  to alfuzosin .  At his visit in February 2025, he continued on alfuzosin  and Gemtesa  with some improvement in his frequency, urgency, and nocturia with the new combination.  No side effects.  No dysuria or gross hematuria. IPSS = 12.  At his visit in March 2024, he continued on alfuzosin .  He had discontinued the Gemtesa  due to cost.  He reported urgency, frequency, and nocturia x 2.  He felt like his urinary symptoms were slightly improved despite being off of the Gemtesa . IPSS = 14/4. He continues to have problems with erectile dysfunction.  No benefit with tadalafil.  He was given a trial of vardenafil  20 mg as needed.  He returns today for follow-up.  He continues on alfuzosin .  His lower  urinary tract symptoms are fairly stable.  He continues to have some frequency, urgency, and nocturia.  No dysuria or gross hematuria. IPSS = 13/4. He has noted improvement in his erectile function with the vardenafil .  He has used 20 mg of sildenafil  along with this with improved results.  No side effects.  Portions of the above documentation were copied from a prior visit for review purposes only.   Past Medical History:  Past Medical History:  Diagnosis Date   Bronchitis    Diverticulosis    FH: BPH (benign prostatic hypertrophy)    History of Graves' disease 01/19/2018   S/p ablation   HTN (hypertension)    Hyperlipidemia    Obesity    Psoriatic arthritis (HCC)    Rheumatoid arthritis (HCC) 08/07/2014   Sleep apnea    CPAP   Thyroid  disease     Past Surgical History:  Past Surgical History:  Procedure Laterality Date   ABDOMINAL AORTOGRAM W/LOWER EXTREMITY N/A 01/14/2022   Procedure: ABDOMINAL AORTOGRAM W/LOWER EXTREMITY;  Surgeon: Darron Deatrice LABOR, MD;  [facility]: [facility];  Service: Cardiovascular;  Laterality: N/A;   ATRIAL FIBRILLATION ABLATION N/A 12/24/2022   Procedure: ATRIAL FIBRILLATION ABLATION;  Surgeon: Nancey Eulas BRAVO, MD;  Location: [facility];  Service: Cardiovascular;  Laterality: N/A;   CHOLECYSTECTOMY N/A 09/18/2023   Procedure: LAPAROSCOPIC CHOLECYSTECTOMY WITH INTRAOPERATIVE CHOLANGIOGRAM;  Surgeon: Kinsinger, Herlene Righter, MD;  Location: [facility] OR;  Service: General;  Laterality: N/A;   INGUINAL HERNIA REPAIR     bilateral   KNEE SURGERY     bilateral arthroscopic   l foot surgery Left 12/2020   PACEMAKER IMPLANT N/A 12/13/2019   Procedure: PACEMAKER IMPLANT;  Surgeon: Waddell Danelle ORN, MD;  Location: [facility];  Service: Cardiovascular;  Laterality: N/A;   TRANSURETHRAL RESECTION OF PROSTATE     UMBILICAL HERNIA REPAIR      Allergies:  Allergies  Allergen Reactions   Codeine     Tension, nasty feeling    Nsaids Other  (See Comments)    Renal insufficiency   Prednisone Other (See Comments)    Unknown/ sometimes takes with lower dose   Statins Other (See Comments)    Joint pain    Sulfa Antibiotics Other (See Comments)    Joint pain    Family History:  Family History  Problem Relation Age of Onset   CAD Mother 59       CABG   CAD Father 64       Died age 33   Diabetes Brother    Lung cancer Maternal Grandmother    Prostate cancer Neg Hx    Colon cancer Neg Hx     Social History:  Social History   Tobacco Use   Smoking status: Former    Types: Cigarettes   Smokeless tobacco: Never   Tobacco comments:    Former smoker Quit 46 years ago. 01/21/23  Vaping Use   Vaping status: Never Used  Substance Use Topics   Alcohol use: No   Drug use: No    ROS: Constitutional:  Negative for fever, chills, weight loss CV: Negative for chest pain, previous MI, hypertension Respiratory:  Negative for shortness of breath, wheezing, sleep apnea, frequent cough GI:  Negative for nausea, vomiting, bloody stool, GERD  Physical exam: BP 108/67   Pulse 75   Ht 5' 10 (1.778 m)   Wt 209 lb (94.8 kg)   BMI 29.99 kg/m  GENERAL APPEARANCE:  Well appearing, well developed, well nourished, NAD HEENT:  Atraumatic, normocephalic, oropharynx clear NECK:  Supple without lymphadenopathy or thyromegaly ABDOMEN:  Soft, non-tender, no masses EXTREMITIES:  Moves all extremities well, without clubbing, cyanosis, or edema NEUROLOGIC:  Alert and oriented x 3, normal gait, CN II-XII grossly intact MENTAL STATUS:  appropriate BACK:  Non-tender to palpation, No CVAT SKIN:  Warm, dry, and intact   Results: none

## 2024-05-29 NOTE — Addendum Note (Signed)
 Addended by: OBADIAH ROSELEE RAMAN on: 05/29/2024 11:30 AM   Modules accepted: Orders

## 2024-06-02 ENCOUNTER — Other Ambulatory Visit: Payer: Self-pay

## 2024-06-02 DIAGNOSIS — I480 Paroxysmal atrial fibrillation: Secondary | ICD-10-CM

## 2024-06-02 MED ORDER — RIVAROXABAN 20 MG PO TABS: 20.0000 mg | ORAL_TABLET | Freq: Every day | ORAL | 1 refills | Status: DC

## 2024-06-02 NOTE — Telephone Encounter (Signed)
 Prescription refill request for Xarelto  received.  Indication:afib Last office visit:5/25 Weight:94.8  kg Age:78 Scr:1.57  5/25 CrCl:51.56  ml/min  Prescription refilled

## 2024-06-07 ENCOUNTER — Encounter: Payer: Self-pay | Admitting: Gastroenterology

## 2024-06-07 ENCOUNTER — Ambulatory Visit: Admitting: Gastroenterology

## 2024-06-07 VITALS — BP 128/76 | HR 61 | Resp 14

## 2024-06-07 DIAGNOSIS — R1013 Epigastric pain: Secondary | ICD-10-CM

## 2024-06-07 DIAGNOSIS — K317 Polyp of stomach and duodenum: Secondary | ICD-10-CM | POA: Diagnosis not present

## 2024-06-07 DIAGNOSIS — M069 Rheumatoid arthritis, unspecified: Secondary | ICD-10-CM | POA: Diagnosis not present

## 2024-06-07 DIAGNOSIS — R1011 Right upper quadrant pain: Secondary | ICD-10-CM

## 2024-06-07 DIAGNOSIS — G4730 Sleep apnea, unspecified: Secondary | ICD-10-CM | POA: Diagnosis not present

## 2024-06-07 DIAGNOSIS — E785 Hyperlipidemia, unspecified: Secondary | ICD-10-CM | POA: Diagnosis not present

## 2024-06-07 DIAGNOSIS — K219 Gastro-esophageal reflux disease without esophagitis: Secondary | ICD-10-CM | POA: Diagnosis not present

## 2024-06-07 DIAGNOSIS — I10 Essential (primary) hypertension: Secondary | ICD-10-CM | POA: Diagnosis not present

## 2024-06-07 MED ORDER — SODIUM CHLORIDE 0.9 % IV SOLN: 500.0000 mL | INTRAVENOUS | Status: AC

## 2024-06-07 NOTE — Progress Notes (Signed)
 Called to room to assist during endoscopic procedure.  Patient ID and intended procedure confirmed with present staff. Received instructions for my participation in the procedure from the performing physician.

## 2024-06-07 NOTE — Progress Notes (Signed)
 Sedate, gd SR, tolerated procedure well, VSS, report to RN

## 2024-06-07 NOTE — Op Note (Addendum)
 [facility] Patient Name: Martin Rubio Procedure Date: 06/07/2024 9:29 AM MRN: [id number] Endoscopist: Sandor Flatter , MD, [id number] Age: 78 Referring MD:  Date of Birth: 1946/03/06 Gender: Male Account #: [id number] Procedure:                Upper GI endoscopy Indications:              Epigastric abdominal pain, Abdominal pain in the                            right upper quadrant, Heartburn, Esophageal reflux,                            Dyspepsia Medicines:                Monitored Anesthesia Care Procedure:                Pre-Anesthesia Assessment:                           - Prior to the procedure, a History and Physical                            was performed, and patient medications and                            allergies were reviewed. The patient's tolerance of                            previous anesthesia was also reviewed. The risks                            and benefits of the procedure and the sedation                            options and risks were discussed with the patient.                            All questions were answered, and informed consent                            was obtained. Prior Anticoagulants: The patient has                            taken Xarelto  (rivaroxaban ), last dose was 2 days                            prior to procedure. ASA Grade Assessment: III - A                            patient with severe systemic disease. After                            reviewing the risks and benefits, the patient was  deemed in satisfactory condition to undergo the                            procedure.                           After obtaining informed consent, the endoscope was                            passed under direct vision. Throughout the                            procedure, the patient's blood pressure, pulse, and                            oxygen saturations were monitored continuously. The                             Olympus Scope SN [id number] was introduced through the                            mouth, and advanced to the third part of duodenum.                            The upper GI endoscopy was accomplished without                            difficulty. The patient tolerated the procedure                            well. Scope In: Scope Out: Findings:                 The examined esophagus was normal.                           The Z-line was regular and was found 40 cm from the                            incisors.                           A few small sessile polyps with no bleeding and no                            stigmata of recent bleeding were found in the                            gastric fundus and in the gastric body. These                            polyps were removed with a cold biopsy forceps.                            Resection and retrieval were complete. Estimated  blood loss was minimal.                           The mucosa was otherwise normal throughout the                            stomach. Biopsies were taken with a cold forceps                            for histology and Helicobacter pylori testing.                            Estimated blood loss was minimal.                           The examined duodenum was normal. Biopsies were                            taken with a cold forceps for histology. Estimated                            blood loss was minimal. Complications:            No immediate complications. Estimated Blood Loss:     Estimated blood loss was minimal. Impression:               - Normal esophagus.                           - Z-line regular, 40 cm from the incisors.                           - A few gastric polyps. Resected and retrieved.                           - Normal mucosa was found in the stomach. Biopsied.                           - Normal examined duodenum. Biopsied. Recommendation:           - Patient has a  contact number available for                            emergencies. The signs and symptoms of potential                            delayed complications were discussed with the                            patient. Return to normal activities tomorrow.                            Written discharge instructions were provided to the                            patient.                           -  Resume previous diet.                           - Continue present medications.                           - Await pathology results.                           - Resume Xarelto  (rivaroxaban ) at prior dose                            tomorrow. Sandor Flatter, MD 06/07/2024 10:11:41 AM

## 2024-06-07 NOTE — Progress Notes (Signed)
 GASTROENTEROLOGY PROCEDURE H&P NOTE   Primary Care Physician: Amon Aloysius BRAVO, MD    Reason for Procedure:   Abdominal pain, dyspepsia, GERD  Plan:    EGD  Patient is appropriate for endoscopic procedure(s) in the ambulatory ([facility]) setting.  The nature of the procedure, as well as the risks, benefits, and alternatives were carefully and thoroughly reviewed with the patient. Ample time for discussion and questions allowed. The patient understood, was satisfied, and agreed to proceed.     HPI: Martin Rubio is a 78 y.o. male who presents for EGD for evaluation of RUQ pain, epigastric pain/dyspepsia, and GERD. Reflux currently treated with Protonix  and Pepcid  along with ginger candy on demand which works well for reflux and dyspepsia.   Holding Xarelto  for procedure today.   Past Medical History:  Diagnosis Date   Allergy    Bronchitis    Cataract    Chronic kidney disease    Diverticulosis    FH: BPH (benign prostatic hypertrophy)    GERD (gastroesophageal reflux disease)    History of Graves' disease 01/19/2018   S/p ablation   HTN (hypertension)    Hyperlipidemia    Neuromuscular disorder (HCC)    Obesity    Psoriatic arthritis (HCC)    Rheumatoid arthritis (HCC) 08/07/2014   Sleep apnea    CPAP   Thyroid  disease     Past Surgical History:  Procedure Laterality Date   ABDOMINAL AORTOGRAM W/LOWER EXTREMITY N/A 01/14/2022   Procedure: ABDOMINAL AORTOGRAM W/LOWER EXTREMITY;  Surgeon: Darron Deatrice LABOR, MD;  Location: [facility];  Service: Cardiovascular;  Laterality: N/A;   ATRIAL FIBRILLATION ABLATION N/A 12/24/2022   Procedure: ATRIAL FIBRILLATION ABLATION;  Surgeon: Nancey Eulas BRAVO, MD;  Location: [facility];  Service: Cardiovascular;  Laterality: N/A;   CHOLECYSTECTOMY N/A 09/18/2023   Procedure: LAPAROSCOPIC CHOLECYSTECTOMY WITH INTRAOPERATIVE CHOLANGIOGRAM;  Surgeon: Kinsinger, Herlene Righter, MD;  Location: [facility] OR;  Service: General;  Laterality:  N/A;   INGUINAL HERNIA REPAIR     bilateral   KNEE SURGERY     bilateral arthroscopic   l foot surgery Left 12/2020   PACEMAKER IMPLANT N/A 12/13/2019   Procedure: PACEMAKER IMPLANT;  Surgeon: Waddell Danelle ORN, MD;  Location: [facility];  Service: Cardiovascular;  Laterality: N/A;   TRANSURETHRAL RESECTION OF PROSTATE     UMBILICAL HERNIA REPAIR      Prior to Admission medications   Medication Sig Start Date End Date Taking? Authorizing Provider  alfuzosin  (UROXATRAL ) 10 MG 24 hr tablet Take 1 tablet (10 mg total) by mouth daily with breakfast. 12/15/23  Yes Stoneking, Adine PARAS., MD  amLODipine  (NORVASC ) 5 MG tablet Take 1 tablet (5 mg total) by mouth daily. 02/29/24  Yes Lavona Agent, MD  cholecalciferol  (VITAMIN D3) 25 MCG (1000 UNIT) tablet Take 2,000 Units by mouth daily in the afternoon.   Yes [provider]  Cyanocobalamin  (VITAMIN B 12) 500 MCG TABS Take 1,000 mcg by mouth daily in the afternoon. 02/29/20  Yes Paz, Jose E, MD  diclofenac  Sodium (VOLTAREN ) 1 % GEL Apply 4 g topically in the morning and at bedtime. 12/16/21  Yes Amon Aloysius BRAVO, MD  dofetilide  (TIKOSYN ) 250 MCG capsule TAKE ONE CAPSULE BY MOUTH TWICE A DAY 04/10/24  Yes Waddell Danelle ORN, MD  famotidine  (PEPCID ) 40 MG tablet Take 1 tablet (40 mg total) by mouth at bedtime. 01/25/24  Yes Craig Alan SAUNDERS, PA-C  ferrous fumarate  (FERRETTS) 325 (106 Fe) MG TABS  tablet Take 1 tablet (106 mg of iron total) by mouth 2 (two) times daily. Patient taking differently: Take 1 tablet by mouth daily. 08/30/23  Yes Paz, Aloysius BRAVO, MD  hydrOXYzine  (VISTARIL ) 25 MG capsule Take 1 capsule (25 mg total) by mouth every 8 (eight) hours as needed. 08/02/23  Yes Amon Aloysius BRAVO, MD  levothyroxine  (SYNTHROID ) 137 MCG tablet Take 1 tablet (137 mcg total) by mouth daily before breakfast. 12/07/23  Yes Paz, Jose E, MD  losartan  (COZAAR ) 50 MG tablet TAKE ONE TABLET BY MOUTH EVERY MORNING AND TAKE ONE TABLET BY MOUTH AT BEDTIME 05/22/24  Yes  Lavona Agent, MD  Melatonin 5 MG TABS Take 5 mg by mouth at bedtime.   Yes [provider]  oxybutynin  (DITROPAN -XL) 10 MG 24 hr tablet Take 10 mg by mouth daily. 05/08/24  Yes [provider]  pantoprazole  (PROTONIX ) 40 MG tablet Take 1 tablet (40 mg total) by mouth daily. 12/15/23  Yes Amon Aloysius BRAVO, MD  sildenafil  (REVATIO ) 20 MG tablet Take 1 tablet  by mouth 30-60 minutes before intercourse 05/29/24  Yes Stoneking, Adine PARAS., MD  tadalafil (CIALIS) 20 MG tablet Take 20 mg by mouth daily as needed. 06/05/24  Yes [provider]  triamcinolone  cream (KENALOG ) 0.1 % Apply 1 application  topically daily as needed for itching. 08/22/19  Yes [provider]  vardenafil  (LEVITRA ) 20 MG tablet Take 1 tablet (20 mg total) by mouth daily as needed for erectile dysfunction. 02/28/24  Yes Stoneking, Adine PARAS., MD  dicyclomine  (BENTYL ) 20 MG tablet TAKE ONE TABLET BY MOUTH EVERY 6 HOURS AS NEEDED FOR ABDOMINAL CRAMPING/DIARRHEA 05/05/24   Dail Meece V, DO  Evolocumab  (REPATHA  SURECLICK) 140 MG/ML SOAJ INJECT 140 MG UNDER THE SKIN EVERY TWO WEEKS INTO THE THIGH, STOMACH, OR UPPER ARM 04/07/24   Lavona Agent, MD  fluticasone  (FLONASE ) 50 MCG/ACT nasal spray Place 1 spray into both nostrils as needed for allergies.    [provider]  meclizine  (ANTIVERT ) 25 MG tablet Take 25 mg by mouth 3 (three) times daily as needed for dizziness. 01/19/18   [provider]  OVER THE COUNTER MEDICATION Take 2 capsules by mouth daily in the afternoon. Nervprin - Healthy Nerve Support - Peripheral Neuropathy Relief    [provider]  rivaroxaban  (XARELTO ) 20 MG TABS tablet Take 1 tablet (20 mg total) by mouth daily with supper. 06/02/24   Mealor, Augustus E, MD    Current Outpatient Medications  Medication Sig Dispense Refill   alfuzosin  (UROXATRAL ) 10 MG 24 hr tablet Take 1 tablet (10 mg total) by mouth daily with breakfast. 30 tablet 11   amLODipine  (NORVASC )  5 MG tablet Take 1 tablet (5 mg total) by mouth daily. 90 tablet 2   cholecalciferol  (VITAMIN D3) 25 MCG (1000 UNIT) tablet Take 2,000 Units by mouth daily in the afternoon.     Cyanocobalamin  (VITAMIN B 12) 500 MCG TABS Take 1,000 mcg by mouth daily in the afternoon.     diclofenac  Sodium (VOLTAREN ) 1 % GEL Apply 4 g topically in the morning and at bedtime. 100 g 6   dofetilide  (TIKOSYN ) 250 MCG capsule TAKE ONE CAPSULE BY MOUTH TWICE A DAY 180 capsule 1   famotidine  (PEPCID ) 40 MG tablet Take 1 tablet (40 mg total) by mouth at bedtime. 90 tablet 1   ferrous fumarate  (FERRETTS) 325 (106 Fe) MG TABS tablet Take 1 tablet (106 mg of iron total) by mouth 2 (two) times daily. (Patient taking  differently: Take 1 tablet by mouth daily.) 180 tablet 1   hydrOXYzine  (VISTARIL ) 25 MG capsule Take 1 capsule (25 mg total) by mouth every 8 (eight) hours as needed. 60 capsule 3   levothyroxine  (SYNTHROID ) 137 MCG tablet Take 1 tablet (137 mcg total) by mouth daily before breakfast. 90 tablet 1   losartan  (COZAAR ) 50 MG tablet TAKE ONE TABLET BY MOUTH EVERY MORNING AND TAKE ONE TABLET BY MOUTH AT BEDTIME 180 tablet 1   Melatonin 5 MG TABS Take 5 mg by mouth at bedtime.     oxybutynin  (DITROPAN -XL) 10 MG 24 hr tablet Take 10 mg by mouth daily.     pantoprazole  (PROTONIX ) 40 MG tablet Take 1 tablet (40 mg total) by mouth daily. 90 tablet 1   sildenafil  (REVATIO ) 20 MG tablet Take 1 tablet  by mouth 30-60 minutes before intercourse 10 tablet 11   tadalafil (CIALIS) 20 MG tablet Take 20 mg by mouth daily as needed.     triamcinolone  cream (KENALOG ) 0.1 % Apply 1 application  topically daily as needed for itching.     vardenafil  (LEVITRA ) 20 MG tablet Take 1 tablet (20 mg total) by mouth daily as needed for erectile dysfunction. 10 tablet 11   dicyclomine  (BENTYL ) 20 MG tablet TAKE ONE TABLET BY MOUTH EVERY 6 HOURS AS NEEDED FOR ABDOMINAL CRAMPING/DIARRHEA 30 tablet 1   Evolocumab  (REPATHA  SURECLICK) 140 MG/ML SOAJ  INJECT 140 MG UNDER THE SKIN EVERY TWO WEEKS INTO THE THIGH, STOMACH, OR UPPER ARM 2 mL 11   fluticasone  (FLONASE ) 50 MCG/ACT nasal spray Place 1 spray into both nostrils as needed for allergies.     meclizine  (ANTIVERT ) 25 MG tablet Take 25 mg by mouth 3 (three) times daily as needed for dizziness.     OVER THE COUNTER MEDICATION Take 2 capsules by mouth daily in the afternoon. Nervprin - Healthy Nerve Support - Peripheral Neuropathy Relief     rivaroxaban  (XARELTO ) 20 MG TABS tablet Take 1 tablet (20 mg total) by mouth daily with supper. 90 tablet 1   Current Facility-Administered Medications  Medication Dose Route Frequency Provider Last Rate Last Admin   0.9 %  sodium chloride  infusion  500 mL Intravenous Continuous Aleysha Meckler V, DO        Allergies as of 06/07/2024 - Review Complete 06/07/2024  Allergen Reaction Noted   Codeine Other (See Comments) 09/07/2013   Nsaids Other (See Comments) 01/21/2018   Prednisone Other (See Comments) 01/27/2016   Statins Other (See Comments) 06/09/2011   Sulfa antibiotics Other (See Comments) 05/15/2014    Family History  Problem Relation Age of Onset   CAD Mother 42       CABG   CAD Father 67       Died age 25   Diabetes Brother    Lung cancer Maternal Grandmother    Prostate cancer Neg Hx    Colon cancer Neg Hx    Esophageal cancer Neg Hx    Rectal cancer Neg Hx    Stomach cancer Neg Hx     Social History   Socioeconomic History   Marital status: Married    Spouse name: Not on file   Number of children: 2   Years of education: Not on file   Highest education level: Associate degree: occupational, Scientist, product/process development, or vocational program  Occupational History   Occupation: Retired    Associate Professor: [employer]  Tobacco Use   Smoking status: Former    Types: Cigarettes   Smokeless  tobacco: Never   Tobacco comments:    Former smoker Quit 46 years ago. 01/21/23  Vaping Use   Vaping status: Never Used  Substance and Sexual Activity    Alcohol use: No   Drug use: No   Sexual activity: Not on file  Other Topics Concern   Not on file  Social History Narrative   Lives with wife.   Social Drivers of Corporate investment banker Strain: Low Risk  (04/03/2024)   Overall Financial Resource Strain (CARDIA)    Difficulty of Paying Living Expenses: Not very hard  Food Insecurity: No Food Insecurity (04/03/2024)   Hunger Vital Sign    Worried About Running Out of Food in the Last Year: Never true    Ran Out of Food in the Last Year: Never true  Transportation Needs: No Transportation Needs (04/03/2024)   PRAPARE - Administrator, Civil Service (Medical): No    Lack of Transportation (Non-Medical): No  Physical Activity: Insufficiently Active (04/03/2024)   Exercise Vital Sign    Days of Exercise per Week: 3 days    Minutes of Exercise per Session: 20 min  Stress: No Stress Concern Present (04/03/2024)   [employer] of Occupational Health - Occupational Stress Questionnaire    Feeling of Stress : Not at all  Social Connections: Socially Integrated (04/03/2024)   Social Connection and Isolation Panel    Frequency of Communication with Friends and Family: Three times a week    Frequency of Social Gatherings with Friends and Family: Twice a week    Attends Religious Services: More than 4 times per year    Active Member of [employer] or Organizations: Yes    Attends Engineer, structural: More than 4 times per year    Marital Status: Married  Catering manager Violence: Not At Risk (09/17/2023)   Humiliation, Afraid, Rape, and Kick questionnaire    Fear of Current or Ex-Partner: No    Emotionally Abused: No    Physically Abused: No    Sexually Abused: No    Physical Exam: Vital signs in last 24 hours: @There  were no vitals taken for this visit. GEN: NAD EYE: Sclerae anicteric ENT: MMM CV: Non-tachycardic Pulm: CTA b/l GI: Soft, NT/ND NEURO:  Alert & Oriented x 3   Sandor Flatter, DO [facility]   06/07/2024 9:34 AM

## 2024-06-07 NOTE — Patient Instructions (Signed)
 Resume previous diet and medications.  Resume Xarelto  tomorrow, July 3rd.  Biopsy results will be sent via MyChart or letter.    YOU HAD AN ENDOSCOPIC PROCEDURE TODAY AT THE [facility]:   Refer to the procedure report that was given to you for any specific questions about what was found during the examination.  If the procedure report does not answer your questions, please call your gastroenterologist to clarify.  If you requested that your care partner not be given the details of your procedure findings, then the procedure report has been included in a sealed envelope for you to review at your convenience later.  YOU SHOULD EXPECT: Some feelings of bloating in the abdomen. Passage of more gas than usual.  Walking can help get rid of the air that was put into your GI tract during the procedure and reduce the bloating. If you had a lower endoscopy (such as a colonoscopy or flexible sigmoidoscopy) you may notice spotting of blood in your stool or on the toilet paper. If you underwent a bowel prep for your procedure, you may not have a normal bowel movement for a few days.  Please Note:  You might notice some irritation and congestion in your nose or some drainage.  This is from the oxygen used during your procedure.  There is no need for concern and it should clear up in a day or so.  SYMPTOMS TO REPORT IMMEDIATELY:  Following upper endoscopy (EGD)  Vomiting of blood or coffee ground material  New chest pain or pain under the shoulder blades  Painful or persistently difficult swallowing  New shortness of breath  Fever of 100F or higher  Black, tarry-looking stools  For urgent or emergent issues, a gastroenterologist can be reached at any hour by calling [phone]. Do not use MyChart messaging for urgent concerns.    DIET:  We do recommend a small meal at first, but then you may proceed to your regular diet.  Drink plenty of fluids but you should avoid alcoholic beverages for  24 hours.  ACTIVITY:  You should plan to take it easy for the rest of today and you should NOT DRIVE or use heavy machinery until tomorrow (because of the sedation medicines used during the test).    FOLLOW UP: Our staff will call the number listed on your records the next business day following your procedure.  We will call around 7:15- 8:00 am to check on you and address any questions or concerns that you may have regarding the information given to you following your procedure. If we do not reach you, we will leave a message.     If any biopsies were taken you will be contacted by phone or by letter within the next 1-3 weeks.  Please call us  at [phone] if you have not heard about the biopsies in 3 weeks.    SIGNATURES/CONFIDENTIALITY: You and/or your care partner have signed paperwork which will be entered into your electronic medical record.  These signatures attest to the fact that that the information above on your After Visit Summary has been reviewed and is understood.  Full responsibility of the confidentiality of this discharge information lies with you and/or your care-partner.

## 2024-06-08 ENCOUNTER — Telehealth: Payer: Self-pay | Admitting: Lactation Services

## 2024-06-08 NOTE — Telephone Encounter (Signed)
  Follow up Call-     06/07/2024    9:07 AM  Call back number  Post procedure Call Back phone  # [phone]  Permission to leave phone message Yes     Patient questions:  Do you have a fever, pain , or abdominal swelling? No. Pain Score  0 *  Have you tolerated food without any problems? Yes.    Have you been able to return to your normal activities? Yes.    Do you have any questions about your discharge instructions: Diet   Yes.   Medications  No. Follow up visit  Yes.    Do you have questions or concerns about your Care? No.  Actions: * If pain score is 4 or above: No action needed, pain <4.

## 2024-06-13 ENCOUNTER — Ambulatory Visit: Payer: Self-pay | Admitting: Gastroenterology

## 2024-06-13 ENCOUNTER — Ambulatory Visit: Payer: Medicare Other

## 2024-06-13 DIAGNOSIS — I495 Sick sinus syndrome: Secondary | ICD-10-CM

## 2024-06-13 LAB — SURGICAL PATHOLOGY

## 2024-06-14 ENCOUNTER — Ambulatory Visit

## 2024-06-14 ENCOUNTER — Ambulatory Visit (INDEPENDENT_AMBULATORY_CARE_PROVIDER_SITE_OTHER): Admitting: Internal Medicine

## 2024-06-14 ENCOUNTER — Encounter: Payer: Self-pay | Admitting: Internal Medicine

## 2024-06-14 ENCOUNTER — Ambulatory Visit (HOSPITAL_BASED_OUTPATIENT_CLINIC_OR_DEPARTMENT_OTHER)
Admission: RE | Admit: 2024-06-14 | Discharge: 2024-06-14 | Disposition: A | Discharge status: Home / Self Care | Source: Ambulatory Visit | Attending: Internal Medicine | Admitting: Internal Medicine

## 2024-06-14 VITALS — BP 138/86 | HR 85 | Temp 98.2°F | Resp 18 | Ht 70.0 in | Wt 215.5 lb

## 2024-06-14 DIAGNOSIS — E538 Deficiency of other specified B group vitamins: Secondary | ICD-10-CM | POA: Diagnosis not present

## 2024-06-14 DIAGNOSIS — M810 Age-related osteoporosis without current pathological fracture: Secondary | ICD-10-CM | POA: Diagnosis not present

## 2024-06-14 DIAGNOSIS — M545 Low back pain, unspecified: Secondary | ICD-10-CM | POA: Diagnosis not present

## 2024-06-14 DIAGNOSIS — M47816 Spondylosis without myelopathy or radiculopathy, lumbar region: Secondary | ICD-10-CM | POA: Diagnosis not present

## 2024-06-14 DIAGNOSIS — M47814 Spondylosis without myelopathy or radiculopathy, thoracic region: Secondary | ICD-10-CM | POA: Diagnosis not present

## 2024-06-14 DIAGNOSIS — M549 Dorsalgia, unspecified: Secondary | ICD-10-CM | POA: Insufficient documentation

## 2024-06-14 LAB — CUP PACEART REMOTE DEVICE CHECK
Battery Remaining Longevity: 105 mo
Battery Voltage: 2.99 V
Brady Statistic AP VP Percent: 26.68 %
Brady Statistic AP VS Percent: 64.93 %
Brady Statistic AS VP Percent: 0.21 %
Brady Statistic AS VS Percent: 8.18 %
Brady Statistic RA Percent Paced: 91.53 %
Brady Statistic RV Percent Paced: 26.89 %
Date Time Interrogation Session: 20250708154833
Implantable Lead Connection Status: 753985
Implantable Lead Connection Status: 753985
Implantable Lead Implant Date: 20210106
Implantable Lead Implant Date: 20210106
Implantable Lead Location: 753859
Implantable Lead Location: 753860
Implantable Lead Model: 3830
Implantable Lead Model: 5076
Implantable Pulse Generator Implant Date: 20210106
Lead Channel Impedance Value: 323 Ohm
Lead Channel Impedance Value: 361 Ohm
Lead Channel Impedance Value: 380 Ohm
Lead Channel Impedance Value: 513 Ohm
Lead Channel Pacing Threshold Amplitude: 0.5 V
Lead Channel Pacing Threshold Amplitude: 0.75 V
Lead Channel Pacing Threshold Pulse Width: 0.4 ms
Lead Channel Pacing Threshold Pulse Width: 0.4 ms
Lead Channel Sensing Intrinsic Amplitude: 1.625 mV
Lead Channel Sensing Intrinsic Amplitude: 1.625 mV
Lead Channel Sensing Intrinsic Amplitude: 27.375 mV
Lead Channel Sensing Intrinsic Amplitude: 27.375 mV
Lead Channel Setting Pacing Amplitude: 1.5 V
Lead Channel Setting Pacing Amplitude: 2.5 V
Lead Channel Setting Pacing Pulse Width: 0.4 ms
Lead Channel Setting Sensing Sensitivity: 2 mV
Zone Setting Status: 755011
Zone Setting Status: 755011

## 2024-06-14 MED ORDER — CYANOCOBALAMIN 1000 MCG/ML IJ SOLN
1000.0000 ug | Freq: Once | INTRAMUSCULAR | Status: AC
  Administered 2024-06-14: 1000 ug via INTRAMUSCULAR

## 2024-06-14 MED ORDER — METHOCARBAMOL 500 MG PO TABS: 500.0000 mg | ORAL_TABLET | Freq: Three times a day (TID) | ORAL | 0 refills | Status: DC | PRN

## 2024-06-14 NOTE — Progress Notes (Addendum)
 Subjective:    Patient ID: Martin Rubio, male    DOB: 06/20/1946, 78 y.o.   MRN: [id number]  DOS:  06/14/2024 Type of visit - description: Acute  Patient woke up in pain 2 days ago. Pain is located the back, bilateral, at the level of around T12, L1. Pain is steady, increases with certain movements. Decreases with pregabalin  but he does not like to take it again because he got very sleepy.  Denies any falls or injuries. No fever or chills. No paresthesias anywhere. No abdominal pain. No rash Admits to very mild dysuria with no other LUTS, this is not necessarily a new issue. Did have some cough intermittently in the last couple of days  Review of Systems See above   Past Medical History:  Diagnosis Date   Allergy    Bronchitis    Cataract    Chronic kidney disease    Diverticulosis    FH: BPH (benign prostatic hypertrophy)    GERD (gastroesophageal reflux disease)    History of Graves' disease 01/19/2018   S/p ablation   HTN (hypertension)    Hyperlipidemia    Neuromuscular disorder (HCC)    Obesity    Psoriatic arthritis (HCC)    Rheumatoid arthritis (HCC) 08/07/2014   Sleep apnea    CPAP   Thyroid  disease     Past Surgical History:  Procedure Laterality Date   ABDOMINAL AORTOGRAM W/LOWER EXTREMITY N/A 01/14/2022   Procedure: ABDOMINAL AORTOGRAM W/LOWER EXTREMITY;  Surgeon: Darron Deatrice LABOR, MD;  [facility]: [facility];  Service: Cardiovascular;  Laterality: N/A;   ATRIAL FIBRILLATION ABLATION N/A 12/24/2022   Procedure: ATRIAL FIBRILLATION ABLATION;  Surgeon: Nancey Eulas BRAVO, MD;  Location: [facility];  Service: Cardiovascular;  Laterality: N/A;   CHOLECYSTECTOMY N/A 09/18/2023   Procedure: LAPAROSCOPIC CHOLECYSTECTOMY WITH INTRAOPERATIVE CHOLANGIOGRAM;  Surgeon: Kinsinger, Herlene Righter, MD;  Location: [facility];  Service: General;  Laterality: N/A;   INGUINAL HERNIA REPAIR     bilateral   KNEE SURGERY     bilateral arthroscopic   l foot surgery  Left 12/2020   PACEMAKER IMPLANT N/A 12/13/2019   Procedure: PACEMAKER IMPLANT;  Surgeon: Waddell Danelle ORN, MD;  Location: [facility];  Service: Cardiovascular;  Laterality: N/A;   TRANSURETHRAL RESECTION OF PROSTATE     UMBILICAL HERNIA REPAIR      Current Outpatient Medications  Medication Instructions   alfuzosin  (UROXATRAL ) 10 mg, Oral, Daily with breakfast   amLODipine  (NORVASC ) 5 mg, Oral, Daily   cholecalciferol  (VITAMIN D3) 2,000 Units, Daily   diclofenac  Sodium (VOLTAREN ) 4 g, Topical, 2 times daily   dicyclomine  (BENTYL ) 20 MG tablet TAKE ONE TABLET BY MOUTH EVERY 6 HOURS AS NEEDED FOR ABDOMINAL CRAMPING/DIARRHEA   dofetilide  (TIKOSYN ) 250 mcg, Oral, 2 times daily   Evolocumab  (REPATHA  SURECLICK) 140 MG/ML SOAJ INJECT 140 MG UNDER THE SKIN EVERY TWO WEEKS INTO THE THIGH, STOMACH, OR UPPER ARM   famotidine  (PEPCID ) 40 mg, Oral, Daily at bedtime   ferrous fumarate  (FERRETTS) 325 (106 Fe) MG TABS tablet 106 mg of iron, Oral, 2 times daily   fluticasone  (FLONASE ) 50 MCG/ACT nasal spray 1 spray, As needed   hydrOXYzine  (VISTARIL ) 25 mg, Oral, Every 8 hours PRN   levothyroxine  (SYNTHROID ) 137 mcg, Oral, Daily before breakfast   losartan  (COZAAR ) 50 MG tablet TAKE ONE TABLET BY MOUTH EVERY MORNING AND TAKE ONE TABLET BY MOUTH AT BEDTIME   meclizine  (ANTIVERT ) 25 mg, 3 times daily PRN  melatonin 5 mg, Daily at bedtime   methocarbamol  (ROBAXIN ) 500 mg, Oral, Every 8 hours PRN   OVER THE COUNTER MEDICATION 2 capsules, Daily   oxybutynin  (DITROPAN -XL) 10 mg, Daily   pantoprazole  (PROTONIX ) 40 mg, Oral, Daily   rivaroxaban  (XARELTO ) 20 mg, Oral, Daily with supper   sildenafil  (REVATIO ) 20 MG tablet Take 1 tablet  by mouth 30-60 minutes before intercourse   tadalafil (CIALIS) 20 mg, Daily PRN   triamcinolone  cream (KENALOG ) 0.1 % 1 application , Daily PRN   vardenafil  (LEVITRA ) 20 mg, Oral, Daily PRN   Vitamin B 12 1,000 mcg, Daily       Objective:   Physical Exam BP  138/86   Pulse 85   Temp 98.2 F (36.8 C) (Oral)   Resp 18   Ht 5' 10 (1.778 m)   Wt 215 lb 8 oz (97.8 kg)   SpO2 97%   BMI 30.92 kg/m  General:   Well developed, NAD, BMI noted. HEENT:  Normocephalic . Face symmetric, atraumatic Lungs:  CTA B Normal respiratory effort, no intercostal retractions, no accessory muscle use. Heart: RRR,  no murmur. MSK: No TTP at the thoracic or lumbar spine. Lower extremities: no pretibial edema bilaterally  Skin: Not pale. Not jaundice Neurologic:  alert & oriented X3.  Speech normal, gait appropriate for age and unassisted.  Straight leg test negative. Psych--  Cognition and judgment appear intact.  Cooperative with normal attention span and concentration.  Behavior appropriate. No anxious or depressed appearing.      Assessment    ASSESSMENT  (transfer to me 12/2019) Hyperglycemia HTN High cholesterol, seen at the lipid clinic Hypothyroidism Psoriatic Arthritis Dr Mai  Osteoporosis:   T score 02-2018 (-) 1.7, T score 9-20 19: (-) 1.5, Fosamax  Rx by previous PCP, unclear exactly when but started a  holiday starting 2022 CKD mild,stable . Creatinine ~1.5 CV: -Paroxysmal A. Fib, dx 09/2019 anticoagulated.  Ablation 12/24/2022. -Sick sinus syndrome, s/p pacemaker (12/13/2019) -Left common iliac aneurysm, AAA  - PVD OSA on CPAP BPH - sees urology HOH  Iron deficiency anemia: 12/02/2021 >> EGD (several polyps suggestive of FGP), C-scope:no polyps, no cancer. Pathology: benign H/o ocular migraines , dx by opht  per pt  B12 deficiency   PLAN Back pain: Started 2 days ago, no history of trauma (although he had some cough), no fever chills or rash. Suspect MSK, plan: X-rays, Tylenol , Robaxin , watch for drowsiness.  Call if not gradually better. Osteoporosis: T score 02-2018 (-) 1.7, T score 9-20 19: (-) 1.5, Fosamax  Rx by previous PCP, unclear exactly when but started a  holiday starting 2022 Plan: Recheck DEXA.

## 2024-06-14 NOTE — Progress Notes (Signed)
 Pt here for monthly B12 injection per Jose Paz MD  B12 1000mcg given left deltoid IM and pt tolerated injection well.  Next B12 injection scheduled for 07/12/2024

## 2024-06-14 NOTE — Patient Instructions (Signed)
 For pain: Tylenol   500 mg OTC 2 tabs a day every 8 hours as needed for pain Use heating pad twice daily Take Robaxin , a muscle relaxant as needed.  Watch for excessive drowsiness. Call if not gradually better.  Please go to the first floor and obtain x-ray.  Will also arrange for a bone density test, they should be calling you to schedule the test.

## 2024-06-15 ENCOUNTER — Ambulatory Visit: Payer: Self-pay | Admitting: Internal Medicine

## 2024-06-15 ENCOUNTER — Other Ambulatory Visit: Payer: Self-pay

## 2024-06-15 DIAGNOSIS — R1013 Epigastric pain: Secondary | ICD-10-CM

## 2024-06-15 MED ORDER — PANTOPRAZOLE SODIUM 40 MG PO TBEC: 40.0000 mg | DELAYED_RELEASE_TABLET | Freq: Every day | ORAL | 1 refills | Status: DC

## 2024-06-15 NOTE — Assessment & Plan Note (Signed)
 Back pain: Started 2 days ago, no history of trauma (although he had some cough), no fever chills or rash. Suspect MSK, plan: X-rays, Tylenol , Robaxin , watch for drowsiness.  Call if not gradually better. Osteoporosis: T score 02-2018 (-) 1.7, T score 9-20 19: (-) 1.5, Fosamax  Rx by previous PCP, unclear exactly when but started a  holiday starting 2022 Plan: Recheck DEXA.

## 2024-06-17 ENCOUNTER — Ambulatory Visit: Payer: Self-pay | Admitting: Internal Medicine

## 2024-06-19 ENCOUNTER — Telehealth: Payer: Self-pay

## 2024-06-19 DIAGNOSIS — M549 Dorsalgia, unspecified: Secondary | ICD-10-CM

## 2024-06-19 MED ORDER — ESOMEPRAZOLE MAGNESIUM 40 MG PO CPDR: 40.0000 mg | DELAYED_RELEASE_CAPSULE | Freq: Two times a day (BID) | ORAL | 2 refills | Status: DC

## 2024-06-19 NOTE — Telephone Encounter (Signed)
 Copied from CRM [id number]. Topic: Referral - Question >> Jun 19, 2024 11:34 AM Charolett L wrote: Reason for CRM: patient is looking to be referred to the PT specialist across the hall from the office and is requesting a call back to set it up

## 2024-06-19 NOTE — Addendum Note (Signed)
 Addended by: Andrue Dini N on: 06/19/2024 01:37 PM   Modules accepted: Orders

## 2024-06-19 NOTE — Telephone Encounter (Signed)
 Patient calling in regards to previous procedure. Please advise.

## 2024-06-19 NOTE — Telephone Encounter (Signed)
**Note De-identified  [place] Obfuscation** Please advise 

## 2024-06-19 NOTE — Telephone Encounter (Signed)
 Called and spoke with patient. Patient reports continued GERD symptoms of burning and epigastric pain, mostly after eating. Patient has been following anti-reflux diet/lifestyle modifications. Patient has been taking Pantoprazole  40 mg daily for a while and Famotidine  40 mg at bedtime, still having symptoms despite this. Patient does sleep slightly elevated at night, last night he felt the acid coming up to his esophagus. Patient makes sure to go to bed at least 3-4 hours after eating. I told patient that you may adjust his medications. Please advise, thank you

## 2024-06-19 NOTE — Telephone Encounter (Signed)
 MyChart message sent to patient with recommendations.  RX for Nexium  40 mg BID sent to pharmacy on file.

## 2024-06-20 NOTE — Telephone Encounter (Signed)
 Okay to refer for physical therapy.  Dx thoracolumbar back pain

## 2024-06-20 NOTE — Telephone Encounter (Signed)
 Referral placed.

## 2024-06-30 NOTE — Therapy (Signed)
 OUTPATIENT PHYSICAL THERAPY THORACOLUMBAR EVALUATION   Patient Name: Martin Rubio MRN: [id number] DOB:1946-09-11, 78 y.o., male Today's Date: 07/04/2024  END OF SESSION:  PT End of Session - 07/04/24 1309     Visit Number 1    Date for PT Re-Evaluation 08/29/24    Authorization Type Medicare & [place]  Life    PT Start Time 1315    PT Stop Time 1402    PT Time Calculation (min) 47 min    Activity Tolerance Patient tolerated treatment well    Behavior During Therapy [facility] for tasks assessed/performed          Past Medical History:  Diagnosis Date   Allergy    Bronchitis    Cataract    Chronic kidney disease    Diverticulosis    FH: BPH (benign prostatic hypertrophy)    GERD (gastroesophageal reflux disease)    History of Graves' disease 01/19/2018   S/p ablation   HTN (hypertension)    Hyperlipidemia    Neuromuscular disorder (HCC)    Obesity    Psoriatic arthritis (HCC)    Rheumatoid arthritis (HCC) 08/07/2014   Sleep apnea    CPAP   Thyroid  disease    Past Surgical History:  Procedure Laterality Date   ABDOMINAL AORTOGRAM W/LOWER EXTREMITY N/A 01/14/2022   Procedure: ABDOMINAL AORTOGRAM W/LOWER EXTREMITY;  Surgeon: Darron Deatrice LABOR, MD;  [facility];  Service: Cardiovascular;  Laterality: N/A;   ATRIAL FIBRILLATION ABLATION N/A 12/24/2022   Procedure: ATRIAL FIBRILLATION ABLATION;  Surgeon: Nancey Eulas BRAVO, MD;  [facility];  Service: Cardiovascular;  Laterality: N/A;   CHOLECYSTECTOMY N/A 09/18/2023   Procedure: LAPAROSCOPIC CHOLECYSTECTOMY WITH INTRAOPERATIVE CHOLANGIOGRAM;  Surgeon: Kinsinger, Herlene Righter, MD;  Location: [facility];  Service: General;  Laterality: N/A;   INGUINAL HERNIA REPAIR     bilateral   KNEE SURGERY     bilateral arthroscopic   l foot surgery Left 12/2020   PACEMAKER IMPLANT N/A 12/13/2019   Procedure: PACEMAKER IMPLANT;  Surgeon: Waddell Danelle ORN, MD;  [facility];  Service: Cardiovascular;   Laterality: N/A;   TRANSURETHRAL RESECTION OF PROSTATE     UMBILICAL HERNIA REPAIR     Patient Active Problem List   Diagnosis Date Noted   B12 deficiency 04/05/2024   Obesity (BMI 30-39.9) 09/17/2023   Acute gallstone pancreatitis 09/16/2023   Benign paroxysmal positional vertigo 06/11/2023   Aortic atherosclerosis (HCC) 03/18/2021   Bruit 02/09/2021   Aortic valve sclerosis 02/09/2021   PCP notes >>>>>>>>>>>>>>> 01/10/2020   Age related osteoporosis 01/10/2020   CKD (chronic kidney disease), stage IV (HCC) 12/16/2019   Pacemaker 12/16/2019   Sick sinus syndrome (HCC) 12/16/2019   Paroxysmal atrial fibrillation (HCC) 10/26/2019   Nodule of flexor tendon sheath 04/13/2019   Hemorrhoids 06/24/2018   Low bone mass 01/19/2018   Iliac aneurysm (HCC) 06/16/2017   Hypertrophic cardiomyopathy (HCC) 06/16/2017   Essential hypertension 12/16/2016   Hyperlipidemia LDL goal <100 12/16/2016   Asthma in adult, mild intermittent, uncomplicated 10/12/2016   Collapsed vertebra, not elsewhere classified, cervical region, initial encounter for fracture (HCC) 10/08/2016   Annual physical exam 10/08/2016   Neuropathy 10/08/2016   Contracture of ankle and foot joint 08/07/2014   Esophageal reflux 08/07/2014   Organic impotence 08/07/2014   Obstructive sleep apnea 08/07/2014   Osteoarthritis 08/07/2014   Sensorineural hearing loss 08/07/2014   Tinnitus 08/07/2014   LVH (left ventricular hypertrophy) 12/25/2012   Diverticulosis 09/01/2011   Hypothyroidism  06/09/2011   Postablative hypothyroidism 06/09/2011   Psoriatic arthritis (HCC) 06/09/2011   BPH with obstruction/lower urinary tract symptoms 06/09/2011    PCP: Amon Aloysius BRAVO, MD   REFERRING PROVIDER: Amon Aloysius BRAVO, MD   REFERRING DIAG: M54.9 (ICD-10-CM) - Acute back pain, unspecified back location, unspecified back pain laterality   Rationale for Evaluation and Treatment: Rehabilitation  THERAPY DIAG:  Other low back pain  Muscle  spasm of back  Muscle weakness (generalized)  Abnormal posture  Unsteadiness on feet  Difficulty in walking, not elsewhere classified  ONSET DATE: ~ Oct 2024  NEXT MD VISIT: 08/01/2024   SUBJECTIVE:                                                                                                                                                                                           SUBJECTIVE STATEMENT: Pt reports pain across his back on both sides beneath ribs. He has his gallbladder removed (Oct 2024) and he thought that was the cause of his pain but he is unsure. The pain came on after that surgery that he can recall. He says that he thinks he woke up one morning with his pain. Difficulty w/ picking up things and getting in and out of car   PERTINENT HISTORY:  Atrial fibrillation ablation 12/24/22; SSS s/p pacemaker 12/13/19; B knee scope; L foot surgery; umbilical and B inguinal hernia repairs; HTN; osteoporosis; OA; psoriatic arthritis; RA; cervical vertebral fracture; thyroid  disease - hypothyroidism; h/o Graves' disease; CKD; GERD; neuropathy; hearing loss; acute vertigo as of early June   PAIN:  Are you having pain? Yes: NPRS scale: 3/10, 8-9/10 at worst  Pain location: across lower back, beneath level of ribs , sometimes to R abdominal area  Pain description: constant, sharp, breath taking Aggravating factors: bending forward, rolling over in bed Relieving factors: muscle relaxers taken recently   PRECAUTIONS: ICD/Pacemaker  RED FLAGS: None   WEIGHT BEARING RESTRICTIONS: No  FALLS:  Has patient fallen in last 6 months? No-pt has come close d/t pain and neuropathy   LIVING ENVIRONMENT: Lives with: lives with their spouse Lives in: House/apartment Stairs: Yes: Internal: 8 or 7 steps; on right going up and down and External: 1 steps; none Has following equipment at home: Quad cane small base and Grab bars  OCCUPATION: Retired  PLOF: Independent and Leisure: walking  with wife at [facility], formerly participated in [employer] at the [employer]   PATIENT GOALS: no pain in general, return to exercise/walking w/o hurting, wants to get into archery   OBJECTIVE:  Note: Objective measures were completed at Evaluation unless  otherwise noted.  DIAGNOSTIC FINDINGS:  EXAM: LUMBAR SPINE - COMPLETE 4+ VIEW; THORACIC SPINE 2 VIEW 06/14/24 FINDINGS: Right convex thoracolumbar curve.No acute fracture or evidence of traumatic listhesis in the thoracolumbar spine. Multilevel spondylosis with anterior osteophytes throughout the thoracic and lumbar spine. Intervertebral disc space height is maintained. Mild lower lumbar facet arthropathy.  IMPRESSION: 1. No acute fracture or traumatic listhesis in the thoracolumbar spine. 2. Mild multilevel spondylosis.  PATIENT SURVEYS:  Modified Oswestry: 38% Interpretation of scores: Score Category Description  0-20% Minimal Disability The patient can cope with most living activities. Usually no treatment is indicated apart from advice on lifting, sitting and exercise  21-40% Moderate Disability The patient experiences more pain and difficulty with sitting, lifting and standing. Travel and social life are more difficult and they may be disabled from work. Personal care, sexual activity and sleeping are not grossly affected, and the patient can usually be managed by conservative means  41-60% Severe Disability Pain remains the main problem in this group, but activities of daily living are affected. These patients require a detailed investigation  61-80% Crippled Back pain impinges on all aspects of the patient's life. Positive intervention is required  81-100% Bed-bound  These patients are either bed-bound or exaggerating their symptoms  Bluford FORBES Zoe DELENA Karon DELENA, et al. Surgery versus conservative management of stable thoracolumbar fracture: the PRESTO feasibility RCT. [place]): [employer]; 2021 Nov. [facility] Technology Assessment, No. 25.62.) Appendix 3, Oswestry Disability Index category descriptors. Available from: FindJewelers.cz  Minimally Clinically Important Difference (MCID) = 12.8%  COGNITION: Overall cognitive status: Within functional limits for tasks assessed     SENSATION: WFL- pt has known peripheral neuropathy in his B feet, diminished sensation into legs some above the ankle    MUSCLE LENGTH: Hamstrings: Right mod tight; Left mod-severe tightness  Hip Flexors: R mod tight, L mod tight  Quads: R mod-severe, L mod severe Piriformis: L mod-severe tight, R mod tight  Hip IR: L mod-severe tight, R mod tight  POSTURE: rounded shoulders, forward head, increased lumbar lordosis, and increased thoracic kyphosis  PALPATION: TTP: R > L thoracolumbar paraspinals, tightness, QL (R>L tightness),   LUMBAR ROM:   AROM eval  Flexion WFL- bends knees to touch toes   Extension 25% limited   Right lateral flexion Mid calf   Left lateral flexion Mid calf  Right rotation Note p! On L (less than R)  Left rotation Limited p! On R side    (Blank rows = not tested)  LOWER EXTREMITY MMT:    MMT Right eval Left eval  Hip flexion 4+ 4+  Hip extension 3- 3-  Hip abduction 4- 4-  Hip adduction 4 3+  Hip internal rotation 4 4  Hip external rotation 4 4  Knee flexion 4+ 4+  Knee extension 5 5  Ankle dorsiflexion 4- 4-  Ankle plantarflexion    Ankle inversion    Ankle eversion     (Blank rows = not tested)  LUMBAR SPECIAL TESTS:  Not assessed   FUNCTIONAL TESTS:  Not assessed   GAIT: Distance walked: clinic distances  Assistive device utilized: None Level of assistance: Complete Independence Comments: pt ambulated into clinic w/o any devices. Displays rounded shoulder, some thoracic kyphosis   TREATMENT DATE:  07/04/24 SELF CARE:  Reviewed eval findings and role of PT in addressing identified deficits as well as instruction in initial HEP (see below).    PATIENT EDUCATION:  Education details: PT eval findings, anticipated POC, need for further assessment of balance, and initial HEP  Person educated: Patient Education method: Explanation, Demonstration, Verbal cues, and MedBridgeGO app access provided Education comprehension: verbalized understanding, returned demonstration, verbal cues required, and needs further education   HOME EXERCISE PROGRAM: Access Code: [email or web address] URL: [email or web address] Date: 07/04/2024 Prepared by: Eusebio Saba  Exercises - Seated 3 Way Exercise [employer] Stretch  - 1 x daily - 7 x weekly - 1 sets - 3 reps - 30 hold  ASSESSMENT:  CLINICAL IMPRESSION: Ethelbert Thain is a 78 y/o M referred to PT for evaluation and treatment of back pain. Pt states that his back pain is localized across his mid to lower back & sometimes seems to wrap around to his R abdominal area. Pt reports that he recalls his pain coming on after he had his gallbladder removed last October but, he does not know for sure if that is the cause of it. Pt says that his pain is bothersome most with bending forward to pick up objects, rolling over in bed, and getting in and out of his car at times. Today's assessment findings include deficits in lumbar ROM, B LE strength, and LE flexibility (as shown in charts above). Pt reported inc'd pain with rotational movements of the lumbar spine. Noted mild tenderness of the pt's R thorocalumbar paraspinals > L. Pt stated that although he has not fully fallen, he often feels unsteady on his feet d/t his B peripheral neuropathy.  Dave scored a 38% on the Modified Oswestry Low Back Pain Disability Questionnaire which represents moderate level of disability in activities such as personal care, sleeping, traveling, and homemaking. [place]  Dora) will benefit from skilled PT intervention to address above deficits to improve mobility, function, and activity tolerance w/ dec'd pain interference.   OBJECTIVE IMPAIRMENTS: decreased activity tolerance, decreased balance, decreased endurance, decreased knowledge of condition, decreased mobility, difficulty walking, decreased ROM, decreased strength, increased fascial restrictions, impaired perceived functional ability, increased muscle spasms, impaired flexibility, impaired sensation, improper body mechanics, postural dysfunction, and pain.   ACTIVITY LIMITATIONS: carrying, lifting, bending, sitting, standing, squatting, sleeping, stairs, transfers, bed mobility, bathing, dressing, and caring for others  PARTICIPATION LIMITATIONS: cleaning, laundry, driving, shopping, and community activity  PERSONAL FACTORS: Age, Fitness, Time since onset of injury/illness/exacerbation, and 3+ comorbidities: HTN; osteoporosis; OA; psoriatic arthritis; RA; cervical vertebral fracture; thyroid  disease - hypothyroidism; h/o Graves' disease; CKD; GERD; neuropathy are also affecting patient's functional outcome.   REHAB POTENTIAL: Good  CLINICAL DECISION MAKING: Evolving/moderate complexity  EVALUATION COMPLEXITY: Moderate   GOALS: Goals reviewed with patient? Yes  SHORT TERM GOALS: Target date: 08/01/2024    Patient will be independent with initial HEP.  Baseline: Goal status: INITIAL  2.  Patient will report 25% improvement in pain to improve activity tolerance.  Baseline: best: 3/10, worst:8-9/10 Goal status: INITIAL  3.  Patient will perform FGA to establish current risk of fall.  Baseline: To be assessed  Goal status: INITIAL   LONG TERM GOALS: Target date: 08/29/2024    Patient will be independent with advanced HEP  Baseline:  Goal status: INITIAL  2.  Patient will increase his score on the Mod Oswestry LBP Disability Questionnaire by at least 12% for improved in functional  ability.  Baseline: 38%  Goal status:  INITIAL  3.  Patient will report 50% improvement in back pain to improve patient's activity tolerance and participation in leisure activities (walking, archery). Baseline:  Goal status: INITIAL  4.  Patient will be able to ascend and descend at least 8 steps to ensure patient safety to negotiate stair at home.  Baseline:  Goal status: INITIAL  5.  Patient will be able to score at least a 23 on the FGA for safe community ambulation.  Baseline:  Goal status: INITIAL   6.  Patient will increase B LE strength grossly to 4+/5 to increase patient function and activity tolerance.  Baseline:  Goal status: INITIAL  PLAN:  PT FREQUENCY: 2x/week  PT DURATION: 8 weeks  PLANNED INTERVENTIONS: 97164- PT Re-evaluation, 97750- Physical Performance Testing, 97110-Therapeutic exercises, 97530- Therapeutic activity, 97112- Neuromuscular re-education, 97535- Self Care, 02859- Manual therapy, [phone]- Gait training, [phone]- Aquatic Therapy, [phone]- Electrical stimulation (unattended), [id number]- Ultrasound, [id number]- Traction (mechanical), [id number]- Ionotophoresis 4mg /ml Dexamethasone , 79439 (1-2 muscles), 20561 (3+ muscles)- Dry Needling, Patient/Family education, Balance training, Stair training, Taping, Joint mobilization, Spinal mobilization, Cryotherapy, and Moist heat.  PLAN FOR NEXT SESSION: Balance assessment with FGA; Review and update initial HEP as needed; Lumbar ROM exercises; LE, core/postural strengthening and stretching   [place] Freshour, Student-PT 07/04/2024, 4:52 PM

## 2024-07-03 ENCOUNTER — Ambulatory Visit (INDEPENDENT_AMBULATORY_CARE_PROVIDER_SITE_OTHER): Admitting: Orthopedic Surgery

## 2024-07-03 DIAGNOSIS — M180 Bilateral primary osteoarthritis of first carpometacarpal joints: Secondary | ICD-10-CM | POA: Diagnosis not present

## 2024-07-03 DIAGNOSIS — M65331 Trigger finger, right middle finger: Secondary | ICD-10-CM

## 2024-07-03 NOTE — Progress Notes (Signed)
 Martin Rubio - 78 y.o. male MRN [id number]  Date of birth: 1945-12-27  Office Visit Note: Visit Date: 07/03/2024 PCP: Amon Aloysius BRAVO, MD Referred by: Amon Aloysius BRAVO, MD  Subjective: No chief complaint on file.  HPI: Martin Rubio is a pleasant 78 y.o. male who returns today for follow-up regarding bilateral thumb CMC arthritis and right long finger trigger digit.  At his most recent visit approximate 6 months prior, he underwent injection to the right long finger trigger digit.  He states that the injection helped relieve his symptoms significantly.  States that the bilateral thumb basilar joint pain is currently manageable with conservative treatments.  Pertinent ROS were reviewed with the patient and found to be negative unless otherwise specified above in HPI.    Assessment & Plan: Visit Diagnoses:  1. Trigger finger, right middle finger   2. Primary osteoarthritis of both first carpometacarpal joints      Plan: He is doing very well overall.  Am pleased to see that the injection has helped relieve his right long finger trigger digit symptoms.  At this juncture, he can return to me as needed.  He expressed full understanding.  I did explain that the trigger digit may recur and the bilateral thumb CMC arthritis may become more symptomatic which could warrant more aggressive treatment in the future.   Follow-up: No follow-ups on file.   Meds & Orders: No orders of the defined types were placed in this encounter.   No orders of the defined types were placed in this encounter.    Procedures: No procedures performed      Clinical History: No specialty comments available.  He reports that he has quit smoking. His smoking use included cigarettes. He has never used smokeless tobacco. No results for input(s): HGBA1C, LABURIC in the last 8760 hours.  Objective:   Vital Signs: There were no vitals taken for this visit.  Physical Exam  Gen: Well-appearing, in no acute  distress; non-toxic CV: Regular Rate. Well-perfused. Warm.  Resp: Breathing unlabored on room air; no wheezing. Psych: Fluid speech in conversation; appropriate affect; normal thought process  Ortho Exam General: Patient is well appearing and in no distress. Cervical spine mobility is full in all directions:   Skin and Muscle: No skin changes are apparent to upper extremities.  Muscle bulk and contour normal, no signs of atrophy.      Range of Motion and Palpation Tests: Mobility is full about the elbows with flexion and extension.  Forearm supination and pronation are 85/85 bilaterally.  Wrist flexion/extension is 75/65 bilaterally.  Digital flexion and extension are full.  Thumb opposition is full to the base of the small fingers bilaterally.     No significant tenderness right long finger A1 pulley region.  Able to perform flexion extension of the digit without clicking or locking.   Moderate tenderness over the bilateral thumb CMC articulation is observed, positive grind for pain, positive crepitus.  MP hyperextension negative bilaterally.    Finklestein test mildly positive bilaterally   Neurologic, Vascular, Motor: Sensation is intact to light touch in the median/radial/ulnar distributions.  Tinel's testing negative at wrist level. Fingers pink and well perfused.  Capillary refill is brisk.     Imaging: No results found.   Past Medical/Family/Surgical/Social History: Medications & Allergies reviewed per EMR, new medications updated. Patient Active Problem List   Diagnosis Date Noted   B12 deficiency 04/05/2024   Obesity (BMI 30-39.9) 09/17/2023   Acute gallstone  pancreatitis 09/16/2023   Benign paroxysmal positional vertigo 06/11/2023   Aortic atherosclerosis (HCC) 03/18/2021   Bruit 02/09/2021   Aortic valve sclerosis 02/09/2021   PCP notes >>>>>>>>>>>>>>> 01/10/2020   Age related osteoporosis 01/10/2020   CKD (chronic kidney disease), stage IV (HCC) 12/16/2019    Pacemaker 12/16/2019   Sick sinus syndrome (HCC) 12/16/2019   Paroxysmal atrial fibrillation (HCC) 10/26/2019   Nodule of flexor tendon sheath 04/13/2019   Hemorrhoids 06/24/2018   Low bone mass 01/19/2018   Iliac aneurysm (HCC) 06/16/2017   Hypertrophic cardiomyopathy (HCC) 06/16/2017   Essential hypertension 12/16/2016   Hyperlipidemia LDL goal <100 12/16/2016   Asthma in adult, mild intermittent, uncomplicated 10/12/2016   Collapsed vertebra, not elsewhere classified, cervical region, initial encounter for fracture (HCC) 10/08/2016   Annual physical exam 10/08/2016   Neuropathy 10/08/2016   Contracture of ankle and foot joint 08/07/2014   Esophageal reflux 08/07/2014   Organic impotence 08/07/2014   Obstructive sleep apnea 08/07/2014   Osteoarthritis 08/07/2014   Sensorineural hearing loss 08/07/2014   Tinnitus 08/07/2014   LVH (left ventricular hypertrophy) 12/25/2012   Diverticulosis 09/01/2011   Hypothyroidism 06/09/2011   Postablative hypothyroidism 06/09/2011   Psoriatic arthritis (HCC) 06/09/2011   BPH with obstruction/lower urinary tract symptoms 06/09/2011   Past Medical History:  Diagnosis Date   Allergy    Bronchitis    Cataract    Chronic kidney disease    Diverticulosis    FH: BPH (benign prostatic hypertrophy)    GERD (gastroesophageal reflux disease)    History of Graves' disease 01/19/2018   S/p ablation   HTN (hypertension)    Hyperlipidemia    Neuromuscular disorder (HCC)    Obesity    Psoriatic arthritis (HCC)    Rheumatoid arthritis (HCC) 08/07/2014   Sleep apnea    CPAP   Thyroid  disease    Family History  Problem Relation Age of Onset   CAD Mother 68       CABG   CAD Father 45       Died age 8   Diabetes Brother    Lung cancer Maternal Grandmother    Prostate cancer Neg Hx    Colon cancer Neg Hx    Esophageal cancer Neg Hx    Rectal cancer Neg Hx    Stomach cancer Neg Hx    Past Surgical History:  Procedure Laterality Date    ABDOMINAL AORTOGRAM W/LOWER EXTREMITY N/A 01/14/2022   Procedure: ABDOMINAL AORTOGRAM W/LOWER EXTREMITY;  Surgeon: Darron Deatrice LABOR, MD;  Location: [facility];  Service: Cardiovascular;  Laterality: N/A;   ATRIAL FIBRILLATION ABLATION N/A 12/24/2022   Procedure: ATRIAL FIBRILLATION ABLATION;  Surgeon: Nancey Eulas BRAVO, MD;  Location: [facility];  Service: Cardiovascular;  Laterality: N/A;   CHOLECYSTECTOMY N/A 09/18/2023   Procedure: LAPAROSCOPIC CHOLECYSTECTOMY WITH INTRAOPERATIVE CHOLANGIOGRAM;  Surgeon: Kinsinger, Herlene Righter, MD;  Location: [facility];  Service: General;  Laterality: N/A;   INGUINAL HERNIA REPAIR     bilateral   KNEE SURGERY     bilateral arthroscopic   l foot surgery Left 12/2020   PACEMAKER IMPLANT N/A 12/13/2019   Procedure: PACEMAKER IMPLANT;  Surgeon: Waddell Danelle ORN, MD;  Location: [facility];  Service: Cardiovascular;  Laterality: N/A;   TRANSURETHRAL RESECTION OF PROSTATE     UMBILICAL HERNIA REPAIR     Social History   Occupational History   Occupation: Retired    Associate Professor: [employer]  Tobacco Use   Smoking status: Former  Types: Cigarettes   Smokeless tobacco: Never   Tobacco comments:    Former smoker Quit 46 years ago. 01/21/23  Vaping Use   Vaping status: Never Used  Substance and Sexual Activity   Alcohol use: No   Drug use: No   Sexual activity: Not on file    Kelena Garrow Estela) Arlinda, M.D. [facility], Hand Surgery

## 2024-07-04 ENCOUNTER — Telehealth: Payer: Self-pay | Admitting: Internal Medicine

## 2024-07-04 ENCOUNTER — Other Ambulatory Visit: Payer: Self-pay | Admitting: Internal Medicine

## 2024-07-04 ENCOUNTER — Ambulatory Visit: Attending: Internal Medicine | Admitting: Physical Therapy

## 2024-07-04 ENCOUNTER — Encounter: Payer: Self-pay | Admitting: Physical Therapy

## 2024-07-04 ENCOUNTER — Other Ambulatory Visit: Payer: Self-pay

## 2024-07-04 DIAGNOSIS — M62830 Muscle spasm of back: Secondary | ICD-10-CM | POA: Diagnosis not present

## 2024-07-04 DIAGNOSIS — M6281 Muscle weakness (generalized): Secondary | ICD-10-CM | POA: Diagnosis not present

## 2024-07-04 DIAGNOSIS — R293 Abnormal posture: Secondary | ICD-10-CM | POA: Diagnosis not present

## 2024-07-04 DIAGNOSIS — R262 Difficulty in walking, not elsewhere classified: Secondary | ICD-10-CM | POA: Diagnosis not present

## 2024-07-04 DIAGNOSIS — R2681 Unsteadiness on feet: Secondary | ICD-10-CM | POA: Diagnosis not present

## 2024-07-04 DIAGNOSIS — M5459 Other low back pain: Secondary | ICD-10-CM | POA: Insufficient documentation

## 2024-07-04 DIAGNOSIS — M549 Dorsalgia, unspecified: Secondary | ICD-10-CM | POA: Diagnosis not present

## 2024-07-04 NOTE — Telephone Encounter (Signed)
 Pt wants refill Methocarbamol  500 mg to [facility] on [address]. Pt only has two pills left.Please call pt when refill sent in.

## 2024-07-04 NOTE — Telephone Encounter (Signed)
 Rx was already sent earlier this morning. See chart.

## 2024-07-04 NOTE — Telephone Encounter (Signed)
 Spoke w/ Pt- informed Rx has been sent to [facility]. Pt verbalized understanding

## 2024-07-11 ENCOUNTER — Ambulatory Visit: Attending: Internal Medicine | Admitting: Physical Therapy

## 2024-07-11 ENCOUNTER — Telehealth: Payer: Self-pay | Admitting: Gastroenterology

## 2024-07-11 ENCOUNTER — Encounter: Payer: Self-pay | Admitting: Physical Therapy

## 2024-07-11 DIAGNOSIS — M62830 Muscle spasm of back: Secondary | ICD-10-CM | POA: Insufficient documentation

## 2024-07-11 DIAGNOSIS — R2689 Other abnormalities of gait and mobility: Secondary | ICD-10-CM | POA: Diagnosis not present

## 2024-07-11 DIAGNOSIS — R262 Difficulty in walking, not elsewhere classified: Secondary | ICD-10-CM | POA: Diagnosis not present

## 2024-07-11 DIAGNOSIS — H9313 Tinnitus, bilateral: Secondary | ICD-10-CM | POA: Diagnosis not present

## 2024-07-11 DIAGNOSIS — R293 Abnormal posture: Secondary | ICD-10-CM | POA: Insufficient documentation

## 2024-07-11 DIAGNOSIS — E785 Hyperlipidemia, unspecified: Secondary | ICD-10-CM | POA: Diagnosis not present

## 2024-07-11 DIAGNOSIS — R2681 Unsteadiness on feet: Secondary | ICD-10-CM | POA: Insufficient documentation

## 2024-07-11 DIAGNOSIS — Z950 Presence of cardiac pacemaker: Secondary | ICD-10-CM | POA: Diagnosis not present

## 2024-07-11 DIAGNOSIS — R42 Dizziness and giddiness: Secondary | ICD-10-CM | POA: Diagnosis not present

## 2024-07-11 DIAGNOSIS — G629 Polyneuropathy, unspecified: Secondary | ICD-10-CM | POA: Diagnosis not present

## 2024-07-11 DIAGNOSIS — Z8719 Personal history of other diseases of the digestive system: Secondary | ICD-10-CM | POA: Diagnosis not present

## 2024-07-11 DIAGNOSIS — R490 Dysphonia: Secondary | ICD-10-CM | POA: Diagnosis not present

## 2024-07-11 DIAGNOSIS — M5459 Other low back pain: Secondary | ICD-10-CM | POA: Insufficient documentation

## 2024-07-11 DIAGNOSIS — K219 Gastro-esophageal reflux disease without esophagitis: Secondary | ICD-10-CM | POA: Diagnosis not present

## 2024-07-11 DIAGNOSIS — Z8711 Personal history of peptic ulcer disease: Secondary | ICD-10-CM | POA: Diagnosis not present

## 2024-07-11 DIAGNOSIS — L4050 Arthropathic psoriasis, unspecified: Secondary | ICD-10-CM | POA: Diagnosis not present

## 2024-07-11 DIAGNOSIS — M6281 Muscle weakness (generalized): Secondary | ICD-10-CM | POA: Insufficient documentation

## 2024-07-11 DIAGNOSIS — Z9049 Acquired absence of other specified parts of digestive tract: Secondary | ICD-10-CM | POA: Diagnosis not present

## 2024-07-11 DIAGNOSIS — E039 Hypothyroidism, unspecified: Secondary | ICD-10-CM | POA: Diagnosis not present

## 2024-07-11 NOTE — Telephone Encounter (Signed)
 Attempted to contact [facility] pharmacy [phone]. Pharmacy is closed for lunch until 3 pm, will follow up after 3.

## 2024-07-11 NOTE — Telephone Encounter (Signed)
 Called [facility] pharmacy, I was informed that patient filled Nexium  RX of 06/19/24. Refill too soon, patient should be able to fill tomorrow. MyChart message sent to patient.

## 2024-07-11 NOTE — Telephone Encounter (Signed)
 Inbound call from patient states pharmacy on file is the correct one but they are stating they haven't received prescription if we can send out again   Please advise  Thank you

## 2024-07-11 NOTE — Therapy (Signed)
 OUTPATIENT PHYSICAL THERAPY THORACOLUMBAR TREATMENT   Patient Name: Martin Rubio MRN: [id number] DOB:1945-12-28, 78 y.o., male Today's Date: 07/11/2024  END OF SESSION:  PT End of Session - 07/11/24 1356     Visit Number 2    Date for PT Re-Evaluation 08/29/24    Authorization Type Medicare & [place]  Life    PT Start Time 1357    PT Stop Time 1439    PT Time Calculation (min) 42 min    Activity Tolerance Patient tolerated treatment well    Behavior During Therapy WFL for tasks assessed/performed          Past Medical History:  Diagnosis Date   Allergy    Bronchitis    Cataract    Chronic kidney disease    Diverticulosis    FH: BPH (benign prostatic hypertrophy)    GERD (gastroesophageal reflux disease)    History of Graves' disease 01/19/2018   S/p ablation   HTN (hypertension)    Hyperlipidemia    Neuromuscular disorder (HCC)    Obesity    Psoriatic arthritis (HCC)    Rheumatoid arthritis (HCC) 08/07/2014   Sleep apnea    CPAP   Thyroid  disease    Past Surgical History:  Procedure Laterality Date   ABDOMINAL AORTOGRAM W/LOWER EXTREMITY N/A 01/14/2022   Procedure: ABDOMINAL AORTOGRAM W/LOWER EXTREMITY;  Surgeon: Darron Deatrice LABOR, MD;  Location: [facility];  Service: Cardiovascular;  Laterality: N/A;   ATRIAL FIBRILLATION ABLATION N/A 12/24/2022   Procedure: ATRIAL FIBRILLATION ABLATION;  Surgeon: Nancey Eulas BRAVO, MD;  Location: [facility];  Service: Cardiovascular;  Laterality: N/A;   CHOLECYSTECTOMY N/A 09/18/2023   Procedure: LAPAROSCOPIC CHOLECYSTECTOMY WITH INTRAOPERATIVE CHOLANGIOGRAM;  Surgeon: Kinsinger, Herlene Righter, MD;  Location: [facility];  Service: General;  Laterality: N/A;   INGUINAL HERNIA REPAIR     bilateral   KNEE SURGERY     bilateral arthroscopic   l foot surgery Left 12/2020   PACEMAKER IMPLANT N/A 12/13/2019   Procedure: PACEMAKER IMPLANT;  Surgeon: Waddell Danelle ORN, MD;  Location: [facility];  Service: Cardiovascular;   Laterality: N/A;   TRANSURETHRAL RESECTION OF PROSTATE     UMBILICAL HERNIA REPAIR     Patient Active Problem List   Diagnosis Date Noted   B12 deficiency 04/05/2024   Obesity (BMI 30-39.9) 09/17/2023   Acute gallstone pancreatitis 09/16/2023   Benign paroxysmal positional vertigo 06/11/2023   Aortic atherosclerosis (HCC) 03/18/2021   Bruit 02/09/2021   Aortic valve sclerosis 02/09/2021   PCP notes >>>>>>>>>>>>>>> 01/10/2020   Age related osteoporosis 01/10/2020   CKD (chronic kidney disease), stage IV (HCC) 12/16/2019   Pacemaker 12/16/2019   Sick sinus syndrome (HCC) 12/16/2019   Paroxysmal atrial fibrillation (HCC) 10/26/2019   Nodule of flexor tendon sheath 04/13/2019   Hemorrhoids 06/24/2018   Low bone mass 01/19/2018   Iliac aneurysm (HCC) 06/16/2017   Hypertrophic cardiomyopathy (HCC) 06/16/2017   Essential hypertension 12/16/2016   Hyperlipidemia LDL goal <100 12/16/2016   Asthma in adult, mild intermittent, uncomplicated 10/12/2016   Collapsed vertebra, not elsewhere classified, cervical region, initial encounter for fracture (HCC) 10/08/2016   Annual physical exam 10/08/2016   Neuropathy 10/08/2016   Contracture of ankle and foot joint 08/07/2014   Esophageal reflux 08/07/2014   Organic impotence 08/07/2014   Obstructive sleep apnea 08/07/2014   Osteoarthritis 08/07/2014   Sensorineural hearing loss 08/07/2014   Tinnitus 08/07/2014   LVH (left ventricular hypertrophy) 12/25/2012   Diverticulosis 09/01/2011   Hypothyroidism  06/09/2011   Postablative hypothyroidism 06/09/2011   Psoriatic arthritis (HCC) 06/09/2011   BPH with obstruction/lower urinary tract symptoms 06/09/2011    PCP: Amon Aloysius BRAVO, MD   REFERRING PROVIDER: Amon Aloysius BRAVO, MD   REFERRING DIAG: M54.9 (ICD-10-CM) - Acute back pain, unspecified back location, unspecified back pain laterality   Rationale for Evaluation and Treatment: Rehabilitation  THERAPY DIAG:  Other low back pain  Muscle  spasm of back  Muscle weakness (generalized)  Abnormal posture  Unsteadiness on feet  ONSET DATE: ~ Oct 2024  NEXT MD VISIT: 08/01/2024   SUBJECTIVE:                                                                                                                                                                                           SUBJECTIVE STATEMENT: Pt reports that he is in a little bit of back across the middle of his back. He is considering changing his mattress to try to alleviate some of the pain. HEP has been going well. Experienced some pain w/ his exercises.   EVAL: Pt reports pain across his back on both sides beneath ribs. He has his gallbladder removed (Oct 2024) and he thought that was the cause of his pain but he is unsure. The pain came on after that surgery that he can recall. He says that he thinks he woke up one morning with his pain. Difficulty w/ picking up things and getting in and out of car   PERTINENT HISTORY:  Atrial fibrillation ablation 12/24/22; SSS s/p pacemaker 12/13/19; B knee scope; L foot surgery; umbilical and B inguinal hernia repairs; HTN; osteoporosis; OA; psoriatic arthritis; RA; cervical vertebral fracture; thyroid  disease - hypothyroidism; h/o Graves' disease; CKD; GERD; neuropathy; hearing loss; acute vertigo as of early June   PAIN:  Are you having pain? Yes: NPRS scale: 3/10 Pain location: across lower back, beneath level of ribs , sometimes to R abdominal area  Pain description: constant, sharp, breath taking Aggravating factors: bending forward, rolling over in bed Relieving factors: muscle relaxers taken recently   PRECAUTIONS: ICD/Pacemaker  RED FLAGS: None   WEIGHT BEARING RESTRICTIONS: No  FALLS:  Has patient fallen in last 6 months? No-pt has come close d/t pain and neuropathy   LIVING ENVIRONMENT: Lives with: lives with their spouse Lives in: House/apartment Stairs: Yes: Internal: 8 or 7 steps; on right going up and down  and External: 1 steps; none Has following equipment at home: Quad cane small base and Grab bars  OCCUPATION: Retired  PLOF: Independent and Leisure: walking with wife at [facility], formerly participated in [employer] at  the [facility]   PATIENT GOALS: no pain in general, return to exercise/walking w/o hurting, wants to get into archery   OBJECTIVE:  Note: Objective measures were completed at Evaluation unless otherwise noted.  DIAGNOSTIC FINDINGS:  EXAM: LUMBAR SPINE - COMPLETE 4+ VIEW; THORACIC SPINE 2 VIEW 06/14/24 FINDINGS: Right convex thoracolumbar curve.No acute fracture or evidence of traumatic listhesis in the thoracolumbar spine. Multilevel spondylosis with anterior osteophytes throughout the thoracic and lumbar spine. Intervertebral disc space height is maintained. Mild lower lumbar facet arthropathy.  IMPRESSION: 1. No acute fracture or traumatic listhesis in the thoracolumbar spine. 2. Mild multilevel spondylosis.  PATIENT SURVEYS:  Modified Oswestry: 38% Interpretation of scores: Score Category Description  0-20% Minimal Disability The patient can cope with most living activities. Usually no treatment is indicated apart from advice on lifting, sitting and exercise  21-40% Moderate Disability The patient experiences more pain and difficulty with sitting, lifting and standing. Travel and social life are more difficult and they may be disabled from work. Personal care, sexual activity and sleeping are not grossly affected, and the patient can usually be managed by conservative means  41-60% Severe Disability Pain remains the main problem in this group, but activities of daily living are affected. These patients require a detailed investigation  61-80% Crippled Back pain impinges on all aspects of the patient's life. Positive intervention is required  81-100% Bed-bound  These patients are either bed-bound or exaggerating their symptoms  Bluford FORBES Zoe DELENA Karon DELENA, et al. Surgery versus conservative management of stable thoracolumbar fracture: the PRESTO feasibility RCT. [place] ([place]): [employer]; 2021 Nov. [facility] Technology Assessment, No. 25.62.) Appendix 3, Oswestry Disability Index category descriptors. Available from: FindJewelers.cz  Minimally Clinically Important Difference (MCID) = 12.8%  COGNITION: Overall cognitive status: Within functional limits for tasks assessed     SENSATION: WFL- pt has known peripheral neuropathy in his B feet, diminished sensation into legs some above the ankle    MUSCLE LENGTH: Hamstrings: Right mod tight; Left mod-severe tightness  Hip Flexors: R mod tight, L mod tight  Quads: R mod-severe, L mod severe Piriformis: L mod-severe tight, R mod tight  Hip IR: L mod-severe tight, R mod tight  POSTURE: rounded shoulders, forward head, increased lumbar lordosis, and increased thoracic kyphosis  PALPATION: TTP: R > L thoracolumbar paraspinals, tightness, QL (R>L tightness),   LUMBAR ROM:   AROM eval  Flexion WFL- bends knees to touch toes   Extension 25% limited   Right lateral flexion Mid calf   Left lateral flexion Mid calf  Right rotation Note p! On L (less than R)  Left rotation Limited p! On R side    (Blank rows = not tested)  LOWER EXTREMITY MMT:    MMT Right eval Left eval  Hip flexion 4+ 4+  Hip extension 3- 3-  Hip abduction 4- 4-  Hip adduction 4 3+  Hip internal rotation 4 4  Hip external rotation 4 4  Knee flexion 4+ 4+  Knee extension 5 5  Ankle dorsiflexion 4- 4-  Ankle plantarflexion    Ankle inversion    Ankle eversion     (Blank rows = not tested)  LUMBAR SPECIAL TESTS:  Not assessed   FUNCTIONAL TESTS:  Not assessed   GAIT: Distance walked: clinic distances  Assistive device utilized: None Level of assistance: Complete Independence Comments: pt ambulated into clinic w/o any devices. Displays rounded  shoulder, some thoracic kyphosis   TREATMENT DATE:  07/11/24 THERAPEUTIC EXERCISE: To improve strength, endurance, ROM, and flexibility.  Demonstration, verbal and tactile cues throughout for technique.  NuStep- L4x6 min SELF CARE: HEP review and revision   Pt reported some pain w/ lumbar L lateral flexion during ball roll outs, suggest widening the legs and decrease amount of reach   Added seated HS stretches to HEP see below   PHYSICAL PERFORMANCE TEST or MEASUREMENT:   Functional Gait  Assessment   Gait Level Surface Walks 20 ft in less than 7 sec but greater than 5.5 sec, uses assistive device, slower speed, mild gait deviations, or deviates 6-10 in outside of the 12 in walkway width.    Change in Gait Speed Able to smoothly change walking speed without loss of balance or gait deviation. Deviate no more than 6 in outside of the 12 in walkway width.    Gait with Horizontal Head Turns Performs head turns smoothly with slight change in gait velocity (eg, minor disruption to smooth gait path), deviates 6-10 in outside 12 in walkway width, or uses an assistive device.    Gait with Vertical Head Turns Performs task with slight change in gait velocity (eg, minor disruption to smooth gait path), deviates 6 - 10 in outside 12 in walkway width or uses assistive device    Gait and Pivot Turn Pivot turns safely within 3 sec and stops quickly with no loss of balance.    Step Over Obstacle Is able to step over 2 stacked shoe boxes taped together (9 in total height) without changing gait speed. No evidence of imbalance.    Gait with Narrow Base of Support Is able to ambulate for 10 steps heel to toe with no staggering.    Gait with Eyes Closed Walks 20 ft, slow speed, abnormal gait pattern, evidence for imbalance, deviates 10-15 in outside 12 in walkway width. Requires more than 9 sec  to ambulate 20 ft.    Ambulating Backwards Walks 20 ft, no assistive devices, good speed, no evidence for imbalance, normal gait    Steps Alternating feet, must use rail.    Total Score 24    FGA comment: 19-24 = medium risk fall        07/04/24 SELF CARE:  Reviewed eval findings and role of PT in addressing identified deficits as well as instruction in initial HEP (see below).    PATIENT EDUCATION:  Education details: HEP review, HEP update, and HEP modification - instructed on positioning for both stretches to avoid   Person educated: Patient Education method: Explanation, Demonstration, Verbal cues, and MedBridgeGO app access provided Education comprehension: verbalized understanding, returned demonstration, verbal cues required, and needs further education   HOME EXERCISE PROGRAM: Access Code: [email or web address] URL: [email or web address] Date: 07/11/2024 Prepared by: Eusebio Saba  Exercises - Seated 3 Way Exercise [employer] Stretch  - 1 x daily - 7 x weekly - 3 sets - 30 hold - Seated Hamstring Stretch  - 1 x daily - 7 x weekly - 3 reps - 30 sec hold  ASSESSMENT:  CLINICAL IMPRESSION: Gene Glazebrook reports that he is continuing to have pain across his mid back and is considering making changes to his mattress at home. Today, reviewed pt's initial HEP as he reports that he had some pain with his seated 3 way ball roll outs. Suggested that he widen his legs more and lessen fwd flx when going to his L side (more pain in this direction). Continued assessment of pt's balance today with the FGA.  Pt scored a 24/30 representing that he is currently a medium fall risk. Pt struggled the most with walking with his eyes closed (9.2s), vertical head turns (small gait deviation, able to current), and stair negotiation. While descending stairs, pt required the use on L hand rail and had 2 steps where he did not use reciprocal steps as pt had two feet on step at one time. Stated that going  down is more difficult for him. Pt stated he would like to try the stairs when leaving session, suggested use of handrail for safety. Deatrice will benefit from skilled PT intervention to address above deficits to improve mobility, function, and activity tolerance w/ dec'd pain interference.   Jobe Mutch is a 78 y/o M referred to PT for evaluation and treatment of back pain. Pt states that his back pain is localized across his mid to lower back & sometimes seems to wrap around to his R abdominal area. Pt reports that he recalls his pain coming on after he had his gallbladder removed last October but, he does not know for sure if that is the cause of it. Pt says that his pain is bothersome most with bending forward to pick up objects, rolling over in bed, and getting in and out of his car at times. Today's assessment findings include deficits in lumbar ROM, B LE strength, and LE flexibility (as shown in charts above). Pt reported inc'd pain with rotational movements of the lumbar spine. Noted mild tenderness of the pt's R thorocalumbar paraspinals > L. Pt stated that although he has not fully fallen, he often feels unsteady on his feet d/t his B peripheral neuropathy.  Dave scored a 38% on the Modified Oswestry Low Back Pain Disability Questionnaire which represents moderate level of disability in activities such as personal care, sleeping, traveling, and homemaking. Montgomery Dora) will benefit from skilled PT intervention to address above deficits to improve mobility, function, and activity tolerance w/ dec'd pain interference.   OBJECTIVE IMPAIRMENTS: decreased activity tolerance, decreased balance, decreased endurance, decreased knowledge of condition, decreased mobility, difficulty walking, decreased ROM, decreased strength, increased fascial restrictions, impaired perceived functional ability, increased muscle spasms, impaired flexibility, impaired sensation, improper body mechanics, postural  dysfunction, and pain.   ACTIVITY LIMITATIONS: carrying, lifting, bending, sitting, standing, squatting, sleeping, stairs, transfers, bed mobility, bathing, dressing, and caring for others  PARTICIPATION LIMITATIONS: cleaning, laundry, driving, shopping, and community activity  PERSONAL FACTORS: Age, Fitness, Time since onset of injury/illness/exacerbation, and 3+ comorbidities: HTN; osteoporosis; OA; psoriatic arthritis; RA; cervical vertebral fracture; thyroid  disease - hypothyroidism; h/o Graves' disease; CKD; GERD; neuropathy are also affecting patient's functional outcome.   REHAB POTENTIAL: Good  CLINICAL DECISION MAKING: Evolving/moderate complexity  EVALUATION COMPLEXITY: Moderate   GOALS: Goals reviewed with patient? Yes  SHORT TERM GOALS: Target date: 08/01/2024    Patient will be independent with initial HEP.  Baseline: Goal status: INITIAL  2.  Patient will report 25% improvement in pain to improve activity tolerance.  Baseline: best: 3/10, worst:8-9/10 Goal status: INITIAL  3.  Patient will perform FGA to establish current risk of fall.  Baseline: To be assessed  Goal status: MET- 07/11/2024   LONG TERM GOALS: Target date: 08/29/2024    Patient will be independent with advanced HEP  Baseline:  Goal status: INITIAL  2.  Patient will increase his score on the Mod Oswestry LBP Disability Questionnaire by at least 12% for improved in functional ability.  Baseline: 38%  Goal status: INITIAL  3.  Patient will report 50% improvement in back pain to improve patient's activity tolerance and participation in leisure activities (walking, archery). Baseline:  Goal status: INITIAL  4.  Patient will be able to ascend and descend at least 8 steps to ensure patient safety to negotiate stair at home.  Baseline:  Goal status: INITIAL  5.  Patient will be able to score at least a 28 on the FGA for safe community ambulation.  Baseline: 07/11/24- 24/30 Goal status: REVISED    6.  Patient will increase B LE strength grossly to 4+/5 to increase patient function and activity tolerance.  Baseline:  Goal status: INITIAL  PLAN:  PT FREQUENCY: 2x/week  PT DURATION: 8 weeks  PLANNED INTERVENTIONS: 97164- PT Re-evaluation, 97750- Physical Performance Testing, 97110-Therapeutic exercises, 97530- Therapeutic activity, [id number]- Neuromuscular re-education, 97535- Self Care, [id number]- Manual therapy, [phone]- Gait training, [phone]- Aquatic Therapy, [phone]- Electrical stimulation (unattended), [id number]- Ultrasound, [id number]- Traction (mechanical), [id number]- Ionotophoresis 4mg /ml Dexamethasone , 79439 (1-2 muscles), 20561 (3+ muscles)- Dry Needling, Patient/Family education, Balance training, Stair training, Taping, Joint mobilization, Spinal mobilization, Cryotherapy, and Moist heat.  PLAN FOR NEXT SESSION:  Review and update HEP as needed; Lumbar ROM exercises; LE, core/postural strengthening and stretching   Ebany Bowermaster, Student-PT 07/11/2024, 4:25 PM

## 2024-07-12 ENCOUNTER — Ambulatory Visit

## 2024-07-12 ENCOUNTER — Ambulatory Visit (INDEPENDENT_AMBULATORY_CARE_PROVIDER_SITE_OTHER)

## 2024-07-12 DIAGNOSIS — R262 Difficulty in walking, not elsewhere classified: Secondary | ICD-10-CM | POA: Diagnosis not present

## 2024-07-12 DIAGNOSIS — M62830 Muscle spasm of back: Secondary | ICD-10-CM

## 2024-07-12 DIAGNOSIS — M5459 Other low back pain: Secondary | ICD-10-CM | POA: Diagnosis not present

## 2024-07-12 DIAGNOSIS — M6281 Muscle weakness (generalized): Secondary | ICD-10-CM | POA: Diagnosis not present

## 2024-07-12 DIAGNOSIS — R2681 Unsteadiness on feet: Secondary | ICD-10-CM

## 2024-07-12 DIAGNOSIS — R293 Abnormal posture: Secondary | ICD-10-CM

## 2024-07-12 DIAGNOSIS — E538 Deficiency of other specified B group vitamins: Secondary | ICD-10-CM | POA: Diagnosis not present

## 2024-07-12 MED ORDER — CYANOCOBALAMIN 1000 MCG/ML IJ SOLN
1000.0000 ug | Freq: Once | INTRAMUSCULAR | Status: AC
  Administered 2024-07-12: 1000 ug via INTRAMUSCULAR

## 2024-07-12 NOTE — Progress Notes (Signed)
 Pt here for monthly B12 injection per Jose Paz MD   B12 1000mcg given left deltoid IM and pt tolerated injection well.   Next B12 injection scheduled for next month

## 2024-07-12 NOTE — Therapy (Signed)
 OUTPATIENT PHYSICAL THERAPY THORACOLUMBAR TREATMENT   Patient Name: Martin Rubio MRN: [id number] DOB:[birth year], 78 y.o., male Today's Date: 07/12/2024  END OF SESSION:  PT End of Session - 07/12/24 1321     Visit Number 3    Date for PT Re-Evaluation 08/29/24    Authorization Type Medicare & [place]  Life    PT Start Time 1315    PT Stop Time 1400    PT Time Calculation (min) 45 min    Activity Tolerance Patient tolerated treatment well    Behavior During Therapy [facility] for tasks assessed/performed           Past Medical History:  Diagnosis Date   Allergy    Bronchitis    Cataract    Chronic kidney disease    Diverticulosis    FH: BPH (benign prostatic hypertrophy)    GERD (gastroesophageal reflux disease)    History of Graves' disease 01/19/2018   S/p ablation   HTN (hypertension)    Hyperlipidemia    Neuromuscular disorder (HCC)    Obesity    Psoriatic arthritis (HCC)    Rheumatoid arthritis (HCC) 08/07/2014   Sleep apnea    CPAP   Thyroid  disease    Past Surgical History:  Procedure Laterality Date   ABDOMINAL AORTOGRAM W/LOWER EXTREMITY N/A 01/14/2022   Procedure: ABDOMINAL AORTOGRAM W/LOWER EXTREMITY;  Surgeon: Darron Deatrice LABOR, MD;  Location: [facility];  Service: Cardiovascular;  Laterality: N/A;   ATRIAL FIBRILLATION ABLATION N/A 12/24/2022   Procedure: ATRIAL FIBRILLATION ABLATION;  Surgeon: Nancey Eulas BRAVO, MD;  Location: [facility];  Service: Cardiovascular;  Laterality: N/A;   CHOLECYSTECTOMY N/A 09/18/2023   Procedure: LAPAROSCOPIC CHOLECYSTECTOMY WITH INTRAOPERATIVE CHOLANGIOGRAM;  Surgeon: Kinsinger, Herlene Righter, MD;  Location: [facility] OR;  Service: General;  Laterality: N/A;   INGUINAL HERNIA REPAIR     bilateral   KNEE SURGERY     bilateral arthroscopic   l foot surgery Left 12/2020   PACEMAKER IMPLANT N/A 12/13/2019   Procedure: PACEMAKER IMPLANT;  Surgeon: Waddell Danelle ORN, MD;  Location: [facility];  Service: Cardiovascular;   Laterality: N/A;   TRANSURETHRAL RESECTION OF PROSTATE     UMBILICAL HERNIA REPAIR     Patient Active Problem List   Diagnosis Date Noted   B12 deficiency 04/05/2024   Obesity (BMI 30-39.9) 09/17/2023   Acute gallstone pancreatitis 09/16/2023   Benign paroxysmal positional vertigo 06/11/2023   Aortic atherosclerosis (HCC) 03/18/2021   Bruit 02/09/2021   Aortic valve sclerosis 02/09/2021   PCP notes >>>>>>>>>>>>>>> 01/10/2020   Age related osteoporosis 01/10/2020   CKD (chronic kidney disease), stage IV (HCC) 12/16/2019   Pacemaker 12/16/2019   Sick sinus syndrome (HCC) 12/16/2019   Paroxysmal atrial fibrillation (HCC) 10/26/2019   Nodule of flexor tendon sheath 04/13/2019   Hemorrhoids 06/24/2018   Low bone mass 01/19/2018   Iliac aneurysm (HCC) 06/16/2017   Hypertrophic cardiomyopathy (HCC) 06/16/2017   Essential hypertension 12/16/2016   Hyperlipidemia LDL goal <100 12/16/2016   Asthma in adult, mild intermittent, uncomplicated 10/12/2016   Collapsed vertebra, not elsewhere classified, cervical region, initial encounter for fracture (HCC) 10/08/2016   Annual physical exam 10/08/2016   Neuropathy 10/08/2016   Contracture of ankle and foot joint 08/07/2014   Esophageal reflux 08/07/2014   Organic impotence 08/07/2014   Obstructive sleep apnea 08/07/2014   Osteoarthritis 08/07/2014   Sensorineural hearing loss 08/07/2014   Tinnitus 08/07/2014   LVH (left ventricular hypertrophy) 12/25/2012   Diverticulosis 09/01/2011  Hypothyroidism 06/09/2011   Postablative hypothyroidism 06/09/2011   Psoriatic arthritis (HCC) 06/09/2011   BPH with obstruction/lower urinary tract symptoms 06/09/2011    PCP: Amon Aloysius BRAVO, MD   REFERRING PROVIDER: Amon Aloysius BRAVO, MD   REFERRING DIAG: M54.9 (ICD-10-CM) - Acute back pain, unspecified back location, unspecified back pain laterality   Rationale for Evaluation and Treatment: Rehabilitation  THERAPY DIAG:  Other low back pain  Muscle  spasm of back  Muscle weakness (generalized)  Abnormal posture  Unsteadiness on feet  ONSET DATE: ~ Oct 2024  NEXT MD VISIT: 08/01/2024   SUBJECTIVE:                                                                                                                                                                                           SUBJECTIVE STATEMENT: Pt reports some pain R mid to lower back.   EVAL: Pt reports pain across his back on both sides beneath ribs. He has his gallbladder removed (Oct 2024) and he thought that was the cause of his pain but he is unsure. The pain came on after that surgery that he can recall. He says that he thinks he woke up one morning with his pain. Difficulty w/ picking up things and getting in and out of car   PERTINENT HISTORY:  Atrial fibrillation ablation 12/24/22; SSS s/p pacemaker 12/13/19; B knee scope; L foot surgery; umbilical and B inguinal hernia repairs; HTN; osteoporosis; OA; psoriatic arthritis; RA; cervical vertebral fracture; thyroid  disease - hypothyroidism; h/o Graves' disease; CKD; GERD; neuropathy; hearing loss; acute vertigo as of early June   PAIN:  Are you having pain? Yes: NPRS scale: 3/10 Pain location: across lower back, beneath level of ribs , sometimes to R abdominal area  Pain description: constant, sharp, breath taking Aggravating factors: bending forward, rolling over in bed Relieving factors: muscle relaxers taken recently   PRECAUTIONS: ICD/Pacemaker  RED FLAGS: None   WEIGHT BEARING RESTRICTIONS: No  FALLS:  Has patient fallen in last 6 months? No-pt has come close d/t pain and neuropathy   LIVING ENVIRONMENT: Lives with: lives with their spouse Lives in: House/apartment Stairs: Yes: Internal: 8 or 7 steps; on right going up and down and External: 1 steps; none Has following equipment at home: Quad cane small base and Grab bars  OCCUPATION: Retired  PLOF: Independent and Leisure: walking with wife at [facility], formerly participated in [employer] at the [employer]   PATIENT GOALS: no pain in general, return to exercise/walking w/o hurting, wants to get into archery   OBJECTIVE:  Note: Objective measures were completed at  Evaluation unless otherwise noted.  DIAGNOSTIC FINDINGS:  EXAM: LUMBAR SPINE - COMPLETE 4+ VIEW; THORACIC SPINE 2 VIEW 06/14/24 FINDINGS: Right convex thoracolumbar curve.No acute fracture or evidence of traumatic listhesis in the thoracolumbar spine. Multilevel spondylosis with anterior osteophytes throughout the thoracic and lumbar spine. Intervertebral disc space height is maintained. Mild lower lumbar facet arthropathy.  IMPRESSION: 1. No acute fracture or traumatic listhesis in the thoracolumbar spine. 2. Mild multilevel spondylosis.  PATIENT SURVEYS:  Modified Oswestry: 38% Interpretation of scores: Score Category Description  0-20% Minimal Disability The patient can cope with most living activities. Usually no treatment is indicated apart from advice on lifting, sitting and exercise  21-40% Moderate Disability The patient experiences more pain and difficulty with sitting, lifting and standing. Travel and social life are more difficult and they may be disabled from work. Personal care, sexual activity and sleeping are not grossly affected, and the patient can usually be managed by conservative means  41-60% Severe Disability Pain remains the main problem in this group, but activities of daily living are affected. These patients require a detailed investigation  61-80% Crippled Back pain impinges on all aspects of the patient's life. Positive intervention is required  81-100% Bed-bound  These patients are either bed-bound or exaggerating their symptoms  Bluford FORBES Zoe DELENA Karon DELENA, et al. Surgery versus conservative management of stable thoracolumbar fracture: the PRESTO feasibility RCT. [place] ([place]): [employer]; 2021 Nov.  [facility] Technology Assessment, No. 25.62.) Appendix 3, Oswestry Disability Index category descriptors. Available from: FindJewelers.cz  Minimally Clinically Important Difference (MCID) = 12.8%  COGNITION: Overall cognitive status: Within functional limits for tasks assessed     SENSATION: WFL- pt has known peripheral neuropathy in his B feet, diminished sensation into legs some above the ankle    MUSCLE LENGTH: Hamstrings: Right mod tight; Left mod-severe tightness  Hip Flexors: R mod tight, L mod tight  Quads: R mod-severe, L mod severe Piriformis: L mod-severe tight, R mod tight  Hip IR: L mod-severe tight, R mod tight  POSTURE: rounded shoulders, forward head, increased lumbar lordosis, and increased thoracic kyphosis  PALPATION: TTP: R > L thoracolumbar paraspinals, tightness, QL (R>L tightness),   LUMBAR ROM:   AROM eval  Flexion WFL- bends knees to touch toes   Extension 25% limited   Right lateral flexion Mid calf   Left lateral flexion Mid calf  Right rotation Note p! On L (less than R)  Left rotation Limited p! On R side    (Blank rows = not tested)  LOWER EXTREMITY MMT:    MMT Right eval Left eval  Hip flexion 4+ 4+  Hip extension 3- 3-  Hip abduction 4- 4-  Hip adduction 4 3+  Hip internal rotation 4 4  Hip external rotation 4 4  Knee flexion 4+ 4+  Knee extension 5 5  Ankle dorsiflexion 4- 4-  Ankle plantarflexion    Ankle inversion    Ankle eversion     (Blank rows = not tested)  LUMBAR SPECIAL TESTS:  Not assessed   FUNCTIONAL TESTS:  Not assessed   GAIT: Distance walked: clinic distances  Assistive device utilized: None Level of assistance: Complete Independence Comments: pt ambulated into clinic w/o any devices. Displays rounded shoulder, some thoracic kyphosis   TREATMENT DATE:  07/12/24 THERAPEUTIC EXERCISE: To improve strength, endurance, ROM, and flexibility.  Demonstration, verbal and tactile cues throughout for technique.  NuStep- L5x6 min Supine LTR both ways x 10 B Seated R/L hamstring stretch x 1 min felt mor stretch with foot on stool  NEUROMUSCULAR RE-EDUCATION: To improve coordination, kinesthesia, posture, and proprioception.  Supine TrA + hip ABD GTB x 15 Supine TrA + marches GTB x 15 Supine TrA sets with breathing x 10 Bridges with TrA set 2x10     07/11/24 THERAPEUTIC EXERCISE: To improve strength, endurance, ROM, and flexibility.  Demonstration, verbal and tactile cues throughout for technique.  NuStep- L4x6 min SELF CARE: HEP review and revision   Pt reported some pain w/ lumbar L lateral flexion during ball roll outs, suggest widening the legs and decrease amount of reach   Added seated HS stretches to HEP see below   PHYSICAL PERFORMANCE TEST or MEASUREMENT:   Functional Gait  Assessment   Gait Level Surface Walks 20 ft in less than 7 sec but greater than 5.5 sec, uses assistive device, slower speed, mild gait deviations, or deviates 6-10 in outside of the 12 in walkway width.    Change in Gait Speed Able to smoothly change walking speed without loss of balance or gait deviation. Deviate no more than 6 in outside of the 12 in walkway width.    Gait with Horizontal Head Turns Performs head turns smoothly with slight change in gait velocity (eg, minor disruption to smooth gait path), deviates 6-10 in outside 12 in walkway width, or uses an assistive device.    Gait with Vertical Head Turns Performs task with slight change in gait velocity (eg, minor disruption to smooth gait path), deviates 6 - 10 in outside 12 in walkway width or uses assistive device    Gait and Pivot Turn Pivot turns safely within 3 sec and stops quickly with no loss of balance.    Step Over Obstacle Is able to step over 2 stacked shoe boxes taped together (9 in total height)  without changing gait speed. No evidence of imbalance.    Gait with Narrow Base of Support Is able to ambulate for 10 steps heel to toe with no staggering.    Gait with Eyes Closed Walks 20 ft, slow speed, abnormal gait pattern, evidence for imbalance, deviates 10-15 in outside 12 in walkway width. Requires more than 9 sec to ambulate 20 ft.    Ambulating Backwards Walks 20 ft, no assistive devices, good speed, no evidence for imbalance, normal gait    Steps Alternating feet, must use rail.    Total Score 24    FGA comment: 19-24 = medium risk fall        07/04/24 SELF CARE:  Reviewed eval findings and role of PT in addressing identified deficits as well as instruction in initial HEP (see below).    PATIENT EDUCATION:  Education details: HEP review, HEP update, and HEP modification - instructed on positioning for both stretches to avoid   Person educated: Patient Education method: Explanation, Demonstration, Verbal cues, and MedBridgeGO app access provided Education comprehension: verbalized understanding, returned demonstration, verbal cues required, and needs further education   HOME EXERCISE PROGRAM: Access Code: [email or web address] URL: [email or web address] Date: 07/12/2024 Prepared by: Tamber Burtch  Exercises - Seated 3 Way Exercise Ball Roll Out Stretch  - 1 x daily - 7 x weekly - 3 sets - 30 hold - Seated Hamstring Stretch (BKA)  - 1 x daily - 7 x weekly -  2 sets - 2 reps - 30 sec hold - Supine Posterior Pelvic Tilt  - 1 x daily - 7 x weekly - 2 sets - 10 reps - 5-10 sec hold - Hooklying Clamshell with Resistance  - 1 x daily - 7 x weekly - 2 sets - 10 reps - Supine March with Resistance Band  - 1 x daily - 7 x weekly - 2 sets - 10 reps  ASSESSMENT:  CLINICAL IMPRESSION: Progressed with more core activation exercises and strengthening. Adjusted HS stretch to target more of HS instead of calf. Difficulty noted with TrA sets during diaphragmatic breathing. Updated HEP to  initiate strengthening. Deatrice will benefit from skilled PT intervention to address above deficits to improve mobility, function, and activity tolerance w/ dec'd pain interference.   Danial Hlavac is a 78 y/o M referred to PT for evaluation and treatment of back pain. Pt states that his back pain is localized across his mid to lower back & sometimes seems to wrap around to his R abdominal area. Pt reports that he recalls his pain coming on after he had his gallbladder removed last October but, he does not know for sure if that is the cause of it. Pt says that his pain is bothersome most with bending forward to pick up objects, rolling over in bed, and getting in and out of his car at times. Today's assessment findings include deficits in lumbar ROM, B LE strength, and LE flexibility (as shown in charts above). Pt reported inc'd pain with rotational movements of the lumbar spine. Noted mild tenderness of the pt's R thorocalumbar paraspinals > L. Pt stated that although he has not fully fallen, he often feels unsteady on his feet d/t his B peripheral neuropathy.  Dave scored a 38% on the Modified Oswestry Low Back Pain Disability Questionnaire which represents moderate level of disability in activities such as personal care, sleeping, traveling, and homemaking. Montgomery Dora) will benefit from skilled PT intervention to address above deficits to improve mobility, function, and activity tolerance w/ dec'd pain interference.   OBJECTIVE IMPAIRMENTS: decreased activity tolerance, decreased balance, decreased endurance, decreased knowledge of condition, decreased mobility, difficulty walking, decreased ROM, decreased strength, increased fascial restrictions, impaired perceived functional ability, increased muscle spasms, impaired flexibility, impaired sensation, improper body mechanics, postural dysfunction, and pain.   ACTIVITY LIMITATIONS: carrying, lifting, bending, sitting, standing, squatting, sleeping,  stairs, transfers, bed mobility, bathing, dressing, and caring for others  PARTICIPATION LIMITATIONS: cleaning, laundry, driving, shopping, and community activity  PERSONAL FACTORS: Age, Fitness, Time since onset of injury/illness/exacerbation, and 3+ comorbidities: HTN; osteoporosis; OA; psoriatic arthritis; RA; cervical vertebral fracture; thyroid  disease - hypothyroidism; h/o Graves' disease; CKD; GERD; neuropathy are also affecting patient's functional outcome.   REHAB POTENTIAL: Good  CLINICAL DECISION MAKING: Evolving/moderate complexity  EVALUATION COMPLEXITY: Moderate   GOALS: Goals reviewed with patient? Yes  SHORT TERM GOALS: Target date: 08/01/2024    Patient will be independent with initial HEP.  Baseline: Goal status: INITIAL  2.  Patient will report 25% improvement in pain to improve activity tolerance.  Baseline: best: 3/10, worst:8-9/10 Goal status: INITIAL  3.  Patient will perform FGA to establish current risk of fall.  Baseline: To be assessed  Goal status: MET- 07/11/2024   LONG TERM GOALS: Target date: 08/29/2024    Patient will be independent with advanced HEP  Baseline:  Goal status: INITIAL  2.  Patient will increase his score on the Mod Oswestry LBP Disability Questionnaire by at least  12% for improved in functional ability.  Baseline: 38%  Goal status: INITIAL  3.  Patient will report 50% improvement in back pain to improve patient's activity tolerance and participation in leisure activities (walking, archery). Baseline:  Goal status: INITIAL  4.  Patient will be able to ascend and descend at least 8 steps to ensure patient safety to negotiate stair at home.  Baseline:  Goal status: INITIAL  5.  Patient will be able to score at least a 28 on the FGA for safe community ambulation.  Baseline: 07/11/24- 24/30 Goal status: REVISED   6.  Patient will increase B LE strength grossly to 4+/5 to increase patient function and activity tolerance.   Baseline:  Goal status: INITIAL  PLAN:  PT FREQUENCY: 2x/week  PT DURATION: 8 weeks  PLANNED INTERVENTIONS: 97164- PT Re-evaluation, 97750- Physical Performance Testing, 97110-Therapeutic exercises, 97530- Therapeutic activity, 97112- Neuromuscular re-education, 97535- Self Care, [id number]- Manual therapy, [phone]- Gait training, [phone]- Aquatic Therapy, [phone]- Electrical stimulation (unattended), [id number]- Ultrasound, [id number]- Traction (mechanical), [id number]- Ionotophoresis 4mg /ml Dexamethasone , 79439 (1-2 muscles), [id number] (3+ muscles)- Dry Needling, Patient/Family education, Balance training, Stair training, Taping, Joint mobilization, Spinal mobilization, Cryotherapy, and Moist heat.  PLAN FOR NEXT SESSION:  Review and update HEP as needed; Lumbar ROM exercises; LE, core/postural strengthening and stretching   Taijon Vink L Sophiah Rolin, PTA 07/12/2024, 2:23 PM

## 2024-07-14 DIAGNOSIS — E039 Hypothyroidism, unspecified: Secondary | ICD-10-CM | POA: Diagnosis not present

## 2024-07-14 DIAGNOSIS — K219 Gastro-esophageal reflux disease without esophagitis: Secondary | ICD-10-CM | POA: Diagnosis not present

## 2024-07-14 DIAGNOSIS — G629 Polyneuropathy, unspecified: Secondary | ICD-10-CM | POA: Diagnosis not present

## 2024-07-14 DIAGNOSIS — H9313 Tinnitus, bilateral: Secondary | ICD-10-CM | POA: Diagnosis not present

## 2024-07-14 DIAGNOSIS — E785 Hyperlipidemia, unspecified: Secondary | ICD-10-CM | POA: Diagnosis not present

## 2024-07-14 DIAGNOSIS — L4050 Arthropathic psoriasis, unspecified: Secondary | ICD-10-CM | POA: Diagnosis not present

## 2024-07-14 DIAGNOSIS — R490 Dysphonia: Secondary | ICD-10-CM | POA: Diagnosis not present

## 2024-07-14 DIAGNOSIS — R2689 Other abnormalities of gait and mobility: Secondary | ICD-10-CM | POA: Diagnosis not present

## 2024-07-17 ENCOUNTER — Ambulatory Visit: Admitting: Physical Therapy

## 2024-07-17 DIAGNOSIS — R262 Difficulty in walking, not elsewhere classified: Secondary | ICD-10-CM | POA: Diagnosis not present

## 2024-07-17 DIAGNOSIS — R2681 Unsteadiness on feet: Secondary | ICD-10-CM

## 2024-07-17 DIAGNOSIS — M6281 Muscle weakness (generalized): Secondary | ICD-10-CM | POA: Diagnosis not present

## 2024-07-17 DIAGNOSIS — M5459 Other low back pain: Secondary | ICD-10-CM | POA: Diagnosis not present

## 2024-07-17 DIAGNOSIS — R293 Abnormal posture: Secondary | ICD-10-CM | POA: Diagnosis not present

## 2024-07-17 DIAGNOSIS — M62830 Muscle spasm of back: Secondary | ICD-10-CM

## 2024-07-17 NOTE — Therapy (Signed)
 OUTPATIENT PHYSICAL THERAPY THORACOLUMBAR TREATMENT   Patient Name: Martin Rubio MRN: [id number] DOB:Apr 19, 1946, 78 y.o., male Today's Date: 07/17/2024  END OF SESSION:  PT End of Session - 07/17/24 1613     Visit Number 4    Date for PT Re-Evaluation 08/29/24    Authorization Type Medicare & [place]  Life    PT Start Time 1613    PT Stop Time 1656    PT Time Calculation (min) 43 min    Activity Tolerance Patient tolerated treatment well    Behavior During Therapy [facility] for tasks assessed/performed           Past Medical History:  Diagnosis Date   Allergy    Bronchitis    Cataract    Chronic kidney disease    Diverticulosis    FH: BPH (benign prostatic hypertrophy)    GERD (gastroesophageal reflux disease)    History of Graves' disease 01/19/2018   S/p ablation   HTN (hypertension)    Hyperlipidemia    Neuromuscular disorder (HCC)    Obesity    Psoriatic arthritis (HCC)    Rheumatoid arthritis (HCC) 08/07/2014   Sleep apnea    CPAP   Thyroid  disease    Past Surgical History:  Procedure Laterality Date   ABDOMINAL AORTOGRAM W/LOWER EXTREMITY N/A 01/14/2022   Procedure: ABDOMINAL AORTOGRAM W/LOWER EXTREMITY;  Surgeon: Darron Deatrice LABOR, MD;  Location: [facility] IN[facility];  Service: Cardiovascular;  Laterality: N/A;   ATRIAL FIBRILLATION ABLATION N/A 12/24/2022   Procedure: ATRIAL FIBRILLATION ABLATION;  Surgeon: Nancey Eulas BRAVO, MD;  Location: [facility] IN[facility];  Service: Cardiovascular;  Laterality: N/A;   CHOLECYSTECTOMY N/A 09/18/2023   Procedure: LAPAROSCOPIC CHOLECYSTECTOMY WITH INTRAOPERATIVE CHOLANGIOGRAM;  Surgeon: Kinsinger, Herlene Righter, MD;  Location: [facility] OR;  Service: General;  Laterality: N/A;   INGUINAL HERNIA REPAIR     bilateral   KNEE SURGERY     bilateral arthroscopic   l foot surgery Left 12/2020   PACEMAKER IMPLANT N/A 12/13/2019   Procedure: PACEMAKER IMPLANT;  Surgeon: Waddell Danelle ORN, MD;  Location: [facility];  Service:  Cardiovascular;  Laterality: N/A;   TRANSURETHRAL RESECTION OF PROSTATE     UMBILICAL HERNIA REPAIR     Patient Active Problem List   Diagnosis Date Noted   B12 deficiency 04/05/2024   Obesity (BMI 30-39.9) 09/17/2023   Acute gallstone pancreatitis 09/16/2023   Benign paroxysmal positional vertigo 06/11/2023   Aortic atherosclerosis (HCC) 03/18/2021   Bruit 02/09/2021   Aortic valve sclerosis 02/09/2021   PCP notes >>>>>>>>>>>>>>> 01/10/2020   Age related osteoporosis 01/10/2020   CKD (chronic kidney disease), stage IV (HCC) 12/16/2019   Pacemaker 12/16/2019   Sick sinus syndrome (HCC) 12/16/2019   Paroxysmal atrial fibrillation (HCC) 10/26/2019   Nodule of flexor tendon sheath 04/13/2019   Hemorrhoids 06/24/2018   Low bone mass 01/19/2018   Iliac aneurysm (HCC) 06/16/2017   Hypertrophic cardiomyopathy (HCC) 06/16/2017   Essential hypertension 12/16/2016   Hyperlipidemia LDL goal <100 12/16/2016   Asthma in adult, mild intermittent, uncomplicated 10/12/2016   Collapsed vertebra, not elsewhere classified, cervical region, initial encounter for fracture (HCC) 10/08/2016   Annual physical exam 10/08/2016   Neuropathy 10/08/2016   Contracture of ankle and foot joint 08/07/2014   Esophageal reflux 08/07/2014   Organic impotence 08/07/2014   Obstructive sleep apnea 08/07/2014   Osteoarthritis 08/07/2014   Sensorineural hearing loss 08/07/2014   Tinnitus 08/07/2014   LVH (left ventricular hypertrophy) 12/25/2012   Diverticulosis 09/01/2011  Hypothyroidism 06/09/2011   Postablative hypothyroidism 06/09/2011   Psoriatic arthritis (HCC) 06/09/2011   BPH with obstruction/lower urinary tract symptoms 06/09/2011    PCP: Amon Aloysius BRAVO, MD   REFERRING PROVIDER: Amon Aloysius BRAVO, MD   REFERRING DIAG: M54.9 (ICD-10-CM) - Acute back pain, unspecified back location, unspecified back pain laterality   Rationale for Evaluation and Treatment: Rehabilitation  THERAPY DIAG:  Other low back  pain  Muscle spasm of back  Muscle weakness (generalized)  Abnormal posture  Unsteadiness on feet  Difficulty in walking, not elsewhere classified  ONSET DATE: ~ Oct 2024  NEXT MD VISIT: 08/01/2024   SUBJECTIVE:                                                                                                                                                                                           SUBJECTIVE STATEMENT: Pt stated that his mid back is hurting today. States it is more R sided in the middle. Pt sat in a lawn chair and it folded up on him, twisting his back quite a bit on the small fall but he did not hit the floor.    EVAL: Pt reports pain across his back on both sides beneath ribs. He has his gallbladder removed (Oct 2024) and he thought that was the cause of his pain but he is unsure. The pain came on after that surgery that he can recall. He says that he thinks he woke up one morning with his pain. Difficulty w/ picking up things and getting in and out of car   PERTINENT HISTORY:  Atrial fibrillation ablation 12/24/22; SSS s/p pacemaker 12/13/19; B knee scope; L foot surgery; umbilical and B inguinal hernia repairs; HTN; osteoporosis; OA; psoriatic arthritis; RA; cervical vertebral fracture; thyroid  disease - hypothyroidism; h/o Graves' disease; CKD; GERD; neuropathy; hearing loss; acute vertigo as of early June   PAIN:  Are you having pain? Yes: NPRS scale: 4-5/10 Pain location: across lower back, beneath level of ribs , sometimes to R abdominal area  Pain description: constant, sharp, breath taking Aggravating factors: bending forward, rolling over in bed Relieving factors: muscle relaxers taken recently   PRECAUTIONS: ICD/Pacemaker  RED FLAGS: None   WEIGHT BEARING RESTRICTIONS: No  FALLS:  Has patient fallen in last 6 months? No-pt has come close d/t pain and neuropathy   LIVING ENVIRONMENT: Lives with: lives with their spouse Lives in:  House/apartment Stairs: Yes: Internal: 8 or 7 steps; on right going up and down and External: 1 steps; none Has following equipment at home: Quad cane small base and Grab bars  OCCUPATION: Retired  PLOF: Independent and Leisure:  walking with wife at [facility], formerly participated in [employer] Program at the [employer]   PATIENT GOALS: no pain in general, return to exercise/walking w/o hurting, wants to get into archery   OBJECTIVE:  Note: Objective measures were completed at Evaluation unless otherwise noted.  DIAGNOSTIC FINDINGS:  EXAM: LUMBAR SPINE - COMPLETE 4+ VIEW; THORACIC SPINE 2 VIEW 06/14/24 FINDINGS: Right convex thoracolumbar curve.No acute fracture or evidence of traumatic listhesis in the thoracolumbar spine. Multilevel spondylosis with anterior osteophytes throughout the thoracic and lumbar spine. Intervertebral disc space height is maintained. Mild lower lumbar facet arthropathy.  IMPRESSION: 1. No acute fracture or traumatic listhesis in the thoracolumbar spine. 2. Mild multilevel spondylosis.  PATIENT SURVEYS:  Modified Oswestry: 38% Interpretation of scores: Score Category Description  0-20% Minimal Disability The patient can cope with most living activities. Usually no treatment is indicated apart from advice on lifting, sitting and exercise  21-40% Moderate Disability The patient experiences more pain and difficulty with sitting, lifting and standing. Travel and social life are more difficult and they may be disabled from work. Personal care, sexual activity and sleeping are not grossly affected, and the patient can usually be managed by conservative means  41-60% Severe Disability Pain remains the main problem in this group, but activities of daily living are affected. These patients require a detailed investigation  61-80% Crippled Back pain impinges on all aspects of the patient's life. Positive intervention is required  81-100% Bed-bound   These patients are either bed-bound or exaggerating their symptoms  Martin Rubio Martin Rubio Martin Rubio, et al. Surgery versus conservative management of stable thoracolumbar fracture: the PRESTO feasibility RCT. [place] ([place]): [employer]; 2021 Nov. [facility] Technology Assessment, No. 25.62.) Appendix 3, Oswestry Disability Index category descriptors. Available from: FindJewelers.cz  Minimally Clinically Important Difference (MCID) = 12.8%  COGNITION: Overall cognitive status: Within functional limits for tasks assessed     SENSATION: WFL- pt has known peripheral neuropathy in his B feet, diminished sensation into legs some above the ankle    MUSCLE LENGTH: Hamstrings: Right mod tight; Left mod-severe tightness  Hip Flexors: R mod tight, L mod tight  Quads: R mod-severe, L mod severe Piriformis: L mod-severe tight, R mod tight  Hip IR: L mod-severe tight, R mod tight  POSTURE: rounded shoulders, forward head, increased lumbar lordosis, and increased thoracic kyphosis  PALPATION: TTP: R > L thoracolumbar paraspinals, tightness, QL (R>L tightness),   LUMBAR ROM:   AROM eval  Flexion WFL- bends knees to touch toes   Extension 25% limited   Right lateral flexion Mid calf   Left lateral flexion Mid calf  Right rotation Note p! On L (less than R)  Left rotation Limited p! On R side    (Blank rows = not tested)  LOWER EXTREMITY MMT:    MMT Right eval Left eval  Hip flexion 4+ 4+  Hip extension 3- 3-  Hip abduction 4- 4-  Hip adduction 4 3+  Hip internal rotation 4 4  Hip external rotation 4 4  Knee flexion 4+ 4+  Knee extension 5 5  Ankle dorsiflexion 4- 4-  Ankle plantarflexion    Ankle inversion    Ankle eversion     (Blank rows = not tested)  LUMBAR SPECIAL TESTS:  Not assessed   FUNCTIONAL TESTS:  Not assessed   GAIT: Distance walked: clinic distances  Assistive device utilized: None Level of assistance: Complete  Independence Comments: pt ambulated into clinic w/o any  devices. Displays rounded shoulder, some thoracic kyphosis   TREATMENT DATE:                                                                                                                               07/17/24 THERAPEUTIC EXERCISE: To improve strength, endurance, ROM, and flexibility.  Demonstration, verbal and tactile cues throughout for technique. NuStep-L5x89min  Seated 3 way [employer] Outs w/ Green Pball w/ 10 holds  Seated HS Stretch 2x30  Supine LTRs x10 B, 3-5   Supine hip adduction + TrA- 2x10, 3: cued pt to breathe through entire movement  Supine Marches + TrA: 3x30  Bridges + TrA 2x10- p! From supine to sit  NEUROMUSCULAR RE-EDUCATION: To improve coordination, kinesthesia, posture, and proprioception.  Standing hip abd 2x10 B-cues for upright posture and dec'd hip ER to achieve motion Standing hip add x10 B Standing hip ext x10 B   07/12/24 THERAPEUTIC EXERCISE: To improve strength, endurance, ROM, and flexibility.  Demonstration, verbal and tactile cues throughout for technique.  NuStep- L5x6 min Supine LTR both ways x 10 B Seated R/L hamstring stretch x 1 min felt mor stretch with foot on stool  NEUROMUSCULAR RE-EDUCATION: To improve coordination, kinesthesia, posture, and proprioception.  Supine TrA + hip ABD GTB x 15 Supine TrA + marches GTB x 15 Supine TrA sets with breathing x 10 Bridges with TrA set 2x10     07/11/24 THERAPEUTIC EXERCISE: To improve strength, endurance, ROM, and flexibility.  Demonstration, verbal and tactile cues throughout for technique.  NuStep- L4x6 min SELF CARE: HEP review and revision   Pt reported some pain w/ lumbar L lateral flexion during ball roll outs, suggest widening the legs and decrease amount of reach   Added seated HS stretches to HEP see below   PHYSICAL PERFORMANCE TEST or MEASUREMENT:   Functional Gait  Assessment   Gait Level Surface Walks 20 ft in less than 7 sec  but greater than 5.5 sec, uses assistive device, slower speed, mild gait deviations, or deviates 6-10 in outside of the 12 in walkway width.    Change in Gait Speed Able to smoothly change walking speed without loss of balance or gait deviation. Deviate no more than 6 in outside of the 12 in walkway width.    Gait with Horizontal Head Turns Performs head turns smoothly with slight change in gait velocity (eg, minor disruption to smooth gait path), deviates 6-10 in outside 12 in walkway width, or uses an assistive device.    Gait with Vertical Head Turns Performs task with slight change in gait velocity (eg, minor disruption to smooth gait path), deviates 6 - 10 in outside 12 in walkway width or uses assistive device    Gait and Pivot Turn Pivot turns safely within 3 sec and stops quickly with no loss of balance.    Step Over Obstacle Is able to step over 2 stacked shoe boxes taped together (9 in total  height) without changing gait speed. No evidence of imbalance.    Gait with Narrow Base of Support Is able to ambulate for 10 steps heel to toe with no staggering.    Gait with Eyes Closed Walks 20 ft, slow speed, abnormal gait pattern, evidence for imbalance, deviates 10-15 in outside 12 in walkway width. Requires more than 9 sec to ambulate 20 ft.    Ambulating Backwards Walks 20 ft, no assistive devices, good speed, no evidence for imbalance, normal gait    Steps Alternating feet, must use rail.    Total Score 24    FGA comment: 19-24 = medium risk fall        07/04/24 SELF CARE:  Reviewed eval findings and role of PT in addressing identified deficits as well as instruction in initial HEP (see below).    PATIENT EDUCATION:  Education details: HEP review, HEP update, and HEP modification - instructed on positioning for both stretches to avoid   Person educated: Patient Education method: Explanation, Demonstration, Verbal cues, and MedBridgeGO app access provided Education comprehension:  verbalized understanding, returned demonstration, verbal cues required, and needs further education   HOME EXERCISE PROGRAM: Access Code: [email or web address] URL: [email or web address] Date: 07/12/2024 Prepared by: Braylin Clark  Exercises - Seated 3 Way Exercise [employer] Out Stretch  - 1 x daily - 7 x weekly - 3 sets - 30 hold - Seated Hamstring Stretch (BKA)  - 1 x daily - 7 x weekly - 2 sets - 2 reps - 30 sec hold - Supine Posterior Pelvic Tilt  - 1 x daily - 7 x weekly - 2 sets - 10 reps - 5-10 sec hold - Hooklying Clamshell with Resistance  - 1 x daily - 7 x weekly - 2 sets - 10 reps - Supine March with Resistance Band  - 1 x daily - 7 x weekly - 2 sets - 10 reps  ASSESSMENT:  CLINICAL IMPRESSION: Deatrice reports that he was feeling inc'd mid back pain, more on the R side than the L. He thinks that he upset his back this past weekend when he went to sit down in a lawn chair and it began to fold/collapse in and to catch himself he twisted his back and has a little bit more pain since then. Today, we continued with core activation exercises and incorporated hip strengthening exercises with postural feedback given as the pt displayed fwd slouching with activities performed in standing. Pt needed inc'd verbal and tactile cuing with hook lying marches as he was performing more of a supine bicycling motion. He also had some difficulty maintaining proper breathing through all motions in supine, noted that he was holding his breath at times and needed reminders to breathe. Deatrice will benefit from skilled PT intervention to address above deficits to improve mobility, function, and activity tolerance w/ dec'd pain interference.   EVAL: Martin Rubio is a 78 y/o M referred to PT for evaluation and treatment of back pain. Pt states that his back pain is localized across his mid to lower back & sometimes seems to wrap around to his R abdominal area. Pt reports that he recalls his pain coming on  after he had his gallbladder removed last October but, he does not know for sure if that is the cause of it. Pt says that his pain is bothersome most with bending forward to pick up objects, rolling over in bed, and getting in and out of his car at times. Today's assessment findings include  deficits in lumbar ROM, B LE strength, and LE flexibility (as shown in charts above). Pt reported inc'd pain with rotational movements of the lumbar spine. Noted mild tenderness of the pt's R thorocalumbar paraspinals > L. Pt stated that although he has not fully fallen, he often feels unsteady on his feet d/t his B peripheral neuropathy.  Dave scored a 38% on the Modified Oswestry Low Back Pain Disability Questionnaire which represents moderate level of disability in activities such as personal care, sleeping, traveling, and homemaking. Montgomery Dora) will benefit from skilled PT intervention to address above deficits to improve mobility, function, and activity tolerance w/ dec'd pain interference.   OBJECTIVE IMPAIRMENTS: decreased activity tolerance, decreased balance, decreased endurance, decreased knowledge of condition, decreased mobility, difficulty walking, decreased ROM, decreased strength, increased fascial restrictions, impaired perceived functional ability, increased muscle spasms, impaired flexibility, impaired sensation, improper body mechanics, postural dysfunction, and pain.   ACTIVITY LIMITATIONS: carrying, lifting, bending, sitting, standing, squatting, sleeping, stairs, transfers, bed mobility, bathing, dressing, and caring for others  PARTICIPATION LIMITATIONS: cleaning, laundry, driving, shopping, and community activity  PERSONAL FACTORS: Age, Fitness, Time since onset of injury/illness/exacerbation, and 3+ comorbidities: HTN; osteoporosis; OA; psoriatic arthritis; RA; cervical vertebral fracture; thyroid  disease - hypothyroidism; h/o Graves' disease; CKD; GERD; neuropathy are also affecting  patient's functional outcome.   REHAB POTENTIAL: Good  CLINICAL DECISION MAKING: Evolving/moderate complexity  EVALUATION COMPLEXITY: Moderate   GOALS: Goals reviewed with patient? Yes  SHORT TERM GOALS: Target date: 08/01/2024    Patient will be independent with initial HEP.  Baseline: Goal status: INITIAL  2.  Patient will report 25% improvement in pain to improve activity tolerance.  Baseline: best: 3/10, worst:8-9/10 Goal status: INITIAL  3.  Patient will perform FGA to establish current risk of fall.  Baseline: To be assessed  Goal status: MET- 07/11/2024   LONG TERM GOALS: Target date: 08/29/2024    Patient will be independent with advanced HEP  Baseline:  Goal status: INITIAL  2.  Patient will increase his score on the Mod Oswestry LBP Disability Questionnaire by at least 12% for improved in functional ability.  Baseline: 38%  Goal status: INITIAL  3.  Patient will report 50% improvement in back pain to improve patient's activity tolerance and participation in leisure activities (walking, archery). Baseline:  Goal status: INITIAL  4.  Patient will be able to ascend and descend at least 8 steps to ensure patient safety to negotiate stair at home.  Baseline:  Goal status: INITIAL  5.  Patient will be able to score at least a 28 on the FGA for safe community ambulation.  Baseline: 07/11/24- 24/30 Goal status: REVISED   6.  Patient will increase B LE strength grossly to 4+/5 to increase patient function and activity tolerance.  Baseline:  Goal status: INITIAL  PLAN:  PT FREQUENCY: 2x/week  PT DURATION: 8 weeks  PLANNED INTERVENTIONS: 97164- PT Re-evaluation, 97750- Physical Performance Testing, 97110-Therapeutic exercises, 97530- Therapeutic activity, [id number]- Neuromuscular re-education, 97535- Self Care, [id number]- Manual therapy, [phone]- Gait training, [phone]- Aquatic Therapy, [phone]- Electrical stimulation (unattended), [id number]- Ultrasound, [id number]- Traction  (mechanical), [id number]- Ionotophoresis 4mg /ml Dexamethasone , 79439 (1-2 muscles), [id number] (3+ muscles)- Dry Needling, Patient/Family education, Balance training, Stair training, Taping, Joint mobilization, Spinal mobilization, Cryotherapy, and Moist heat.  PLAN FOR NEXT SESSION:  Review and update HEP as needed; Assess STGs; Continue Lumbar ROM exercises; LE, core/postural strengthening and stretching   Carroll Ranney, Student-PT 07/17/2024, 5:12 PM

## 2024-07-19 ENCOUNTER — Ambulatory Visit

## 2024-07-19 DIAGNOSIS — M5459 Other low back pain: Secondary | ICD-10-CM | POA: Diagnosis not present

## 2024-07-19 DIAGNOSIS — R262 Difficulty in walking, not elsewhere classified: Secondary | ICD-10-CM | POA: Diagnosis not present

## 2024-07-19 DIAGNOSIS — M6281 Muscle weakness (generalized): Secondary | ICD-10-CM

## 2024-07-19 DIAGNOSIS — R293 Abnormal posture: Secondary | ICD-10-CM | POA: Diagnosis not present

## 2024-07-19 DIAGNOSIS — R2681 Unsteadiness on feet: Secondary | ICD-10-CM | POA: Diagnosis not present

## 2024-07-19 DIAGNOSIS — M62830 Muscle spasm of back: Secondary | ICD-10-CM

## 2024-07-19 NOTE — Therapy (Signed)
 OUTPATIENT PHYSICAL THERAPY THORACOLUMBAR TREATMENT   Patient Name: Martin Rubio MRN: [id number] DOB:[birth year], 78 y.o., male Today's Date: 07/19/2024  END OF SESSION:  PT End of Session - 07/19/24 1312     Visit Number 5    Date for PT Re-Evaluation 08/29/24    Authorization Type Medicare & [place]  Life    PT Start Time 1308    PT Stop Time 1357    PT Time Calculation (min) 49 min    Activity Tolerance Patient tolerated treatment well    Behavior During Therapy [facility] for tasks assessed/performed            Past Medical History:  Diagnosis Date   Allergy    Bronchitis    Cataract    Chronic kidney disease    Diverticulosis    FH: BPH (benign prostatic hypertrophy)    GERD (gastroesophageal reflux disease)    History of Graves' disease 01/19/2018   S/p ablation   HTN (hypertension)    Hyperlipidemia    Neuromuscular disorder (HCC)    Obesity    Psoriatic arthritis (HCC)    Rheumatoid arthritis (HCC) 08/07/2014   Sleep apnea    CPAP   Thyroid  disease    Past Surgical History:  Procedure Laterality Date   ABDOMINAL AORTOGRAM W/LOWER EXTREMITY N/A 01/14/2022   Procedure: ABDOMINAL AORTOGRAM W/LOWER EXTREMITY;  Surgeon: Darron Deatrice LABOR, MD;  Location: [facility];  Service: Cardiovascular;  Laterality: N/A;   ATRIAL FIBRILLATION ABLATION N/A 12/24/2022   Procedure: ATRIAL FIBRILLATION ABLATION;  Surgeon: Nancey Eulas BRAVO, MD;  Location: [facility];  Service: Cardiovascular;  Laterality: N/A;   CHOLECYSTECTOMY N/A 09/18/2023   Procedure: LAPAROSCOPIC CHOLECYSTECTOMY WITH INTRAOPERATIVE CHOLANGIOGRAM;  Surgeon: Kinsinger, Herlene Righter, MD;  Location: [facility] OR;  Service: General;  Laterality: N/A;   INGUINAL HERNIA REPAIR     bilateral   KNEE SURGERY     bilateral arthroscopic   l foot surgery Left 12/2020   PACEMAKER IMPLANT N/A 12/13/2019   Procedure: PACEMAKER IMPLANT;  Surgeon: Waddell Danelle ORN, MD;  Location: [facility];  Service:  Cardiovascular;  Laterality: N/A;   TRANSURETHRAL RESECTION OF PROSTATE     UMBILICAL HERNIA REPAIR     Patient Active Problem List   Diagnosis Date Noted   B12 deficiency 04/05/2024   Obesity (BMI 30-39.9) 09/17/2023   Acute gallstone pancreatitis 09/16/2023   Benign paroxysmal positional vertigo 06/11/2023   Aortic atherosclerosis (HCC) 03/18/2021   Bruit 02/09/2021   Aortic valve sclerosis 02/09/2021   PCP notes >>>>>>>>>>>>>>> 01/10/2020   Age related osteoporosis 01/10/2020   CKD (chronic kidney disease), stage IV (HCC) 12/16/2019   Pacemaker 12/16/2019   Sick sinus syndrome (HCC) 12/16/2019   Paroxysmal atrial fibrillation (HCC) 10/26/2019   Nodule of flexor tendon sheath 04/13/2019   Hemorrhoids 06/24/2018   Low bone mass 01/19/2018   Iliac aneurysm (HCC) 06/16/2017   Hypertrophic cardiomyopathy (HCC) 06/16/2017   Essential hypertension 12/16/2016   Hyperlipidemia LDL goal <100 12/16/2016   Asthma in adult, mild intermittent, uncomplicated 10/12/2016   Collapsed vertebra, not elsewhere classified, cervical region, initial encounter for fracture (HCC) 10/08/2016   Annual physical exam 10/08/2016   Neuropathy 10/08/2016   Contracture of ankle and foot joint 08/07/2014   Esophageal reflux 08/07/2014   Organic impotence 08/07/2014   Obstructive sleep apnea 08/07/2014   Osteoarthritis 08/07/2014   Sensorineural hearing loss 08/07/2014   Tinnitus 08/07/2014   LVH (left ventricular hypertrophy) 12/25/2012   Diverticulosis 09/01/2011  Hypothyroidism 06/09/2011   Postablative hypothyroidism 06/09/2011   Psoriatic arthritis (HCC) 06/09/2011   BPH with obstruction/lower urinary tract symptoms 06/09/2011    PCP: Amon Aloysius BRAVO, MD   REFERRING PROVIDER: Amon Aloysius BRAVO, MD   REFERRING DIAG: M54.9 (ICD-10-CM) - Acute back pain, unspecified back location, unspecified back pain laterality   Rationale for Evaluation and Treatment: Rehabilitation  THERAPY DIAG:  Other low back  pain  Muscle spasm of back  Muscle weakness (generalized)  Abnormal posture  Unsteadiness on feet  Difficulty in walking, not elsewhere classified  ONSET DATE: ~ Oct 2024  NEXT MD VISIT: 08/01/2024   SUBJECTIVE:                                                                                                                                                                                           SUBJECTIVE STATEMENT: Pt reports mild low back pain and R heel pain today. Back is a little tight today.   EVAL: Pt reports pain across his back on both sides beneath ribs. He has his gallbladder removed (Oct 2024) and he thought that was the cause of his pain but he is unsure. The pain came on after that surgery that he can recall. He says that he thinks he woke up one morning with his pain. Difficulty w/ picking up things and getting in and out of car   PERTINENT HISTORY:  Atrial fibrillation ablation 12/24/22; SSS s/p pacemaker 12/13/19; B knee scope; L foot surgery; umbilical and B inguinal hernia repairs; HTN; osteoporosis; OA; psoriatic arthritis; RA; cervical vertebral fracture; thyroid  disease - hypothyroidism; h/o Graves' disease; CKD; GERD; neuropathy; hearing loss; acute vertigo as of early June   PAIN:  Are you having pain? Yes: NPRS scale: 1/10; 2/10 in R foot/heel Pain location: across lower back, beneath level of ribs , sometimes to R abdominal area  Pain description: constant, sharp, breath taking Aggravating factors: bending forward, rolling over in bed Relieving factors: muscle relaxers taken recently   PRECAUTIONS: ICD/Pacemaker  RED FLAGS: None   WEIGHT BEARING RESTRICTIONS: No  FALLS:  Has patient fallen in last 6 months? No-pt has come close d/t pain and neuropathy   LIVING ENVIRONMENT: Lives with: lives with their spouse Lives in: House/apartment Stairs: Yes: Internal: 8 or 7 steps; on right going up and down and External: 1 steps; none Has following  equipment at home: Quad cane small base and Grab bars  OCCUPATION: Retired  PLOF: Independent and Leisure: walking with wife at [facility], formerly participated in [employer] at the [employer]   PATIENT GOALS: no pain in general, return  to exercise/walking w/o hurting, wants to get into archery   OBJECTIVE:  Note: Objective measures were completed at Evaluation unless otherwise noted.  DIAGNOSTIC FINDINGS:  EXAM: LUMBAR SPINE - COMPLETE 4+ VIEW; THORACIC SPINE 2 VIEW 06/14/24 FINDINGS: Right convex thoracolumbar curve.No acute fracture or evidence of traumatic listhesis in the thoracolumbar spine. Multilevel spondylosis with anterior osteophytes throughout the thoracic and lumbar spine. Intervertebral disc space height is maintained. Mild lower lumbar facet arthropathy.  IMPRESSION: 1. No acute fracture or traumatic listhesis in the thoracolumbar spine. 2. Mild multilevel spondylosis.  PATIENT SURVEYS:  Modified Oswestry: 38% Interpretation of scores: Score Category Description  0-20% Minimal Disability The patient can cope with most living activities. Usually no treatment is indicated apart from advice on lifting, sitting and exercise  21-40% Moderate Disability The patient experiences more pain and difficulty with sitting, lifting and standing. Travel and social life are more difficult and they may be disabled from work. Personal care, sexual activity and sleeping are not grossly affected, and the patient can usually be managed by conservative means  41-60% Severe Disability Pain remains the main problem in this group, but activities of daily living are affected. These patients require a detailed investigation  61-80% Crippled Back pain impinges on all aspects of the patient's life. Positive intervention is required  81-100% Bed-bound  These patients are either bed-bound or exaggerating their symptoms  Bluford FORBES Zoe DELENA Karon DELENA, et al. Surgery versus  conservative management of stable thoracolumbar fracture: the PRESTO feasibility RCT. [place] ([place]): [employer]; 2021 Nov. [facility] Technology Assessment, No. 25.62.) Appendix 3, Oswestry Disability Index category descriptors. Available from: FindJewelers.cz  Minimally Clinically Important Difference (MCID) = 12.8%  COGNITION: Overall cognitive status: Within functional limits for tasks assessed     SENSATION: WFL- pt has known peripheral neuropathy in his B feet, diminished sensation into legs some above the ankle    MUSCLE LENGTH: Hamstrings: Right mod tight; Left mod-severe tightness  Hip Flexors: R mod tight, L mod tight  Quads: R mod-severe, L mod severe Piriformis: L mod-severe tight, R mod tight  Hip IR: L mod-severe tight, R mod tight  POSTURE: rounded shoulders, forward head, increased lumbar lordosis, and increased thoracic kyphosis  PALPATION: TTP: R > L thoracolumbar paraspinals, tightness, QL (R>L tightness),   LUMBAR ROM:   AROM eval  Flexion WFL- bends knees to touch toes   Extension 25% limited   Right lateral flexion Mid calf   Left lateral flexion Mid calf  Right rotation Note p! On L (less than R)  Left rotation Limited p! On R side    (Blank rows = not tested)  LOWER EXTREMITY MMT:    MMT Right eval Left eval  Hip flexion 4+ 4+  Hip extension 3- 3-  Hip abduction 4- 4-  Hip adduction 4 3+  Hip internal rotation 4 4  Hip external rotation 4 4  Knee flexion 4+ 4+  Knee extension 5 5  Ankle dorsiflexion 4- 4-  Ankle plantarflexion    Ankle inversion    Ankle eversion     (Blank rows = not tested)  LUMBAR SPECIAL TESTS:  Not assessed   FUNCTIONAL TESTS:  Not assessed   GAIT: Distance walked: clinic distances  Assistive device utilized: None Level of assistance: Complete Independence Comments: pt ambulated into clinic w/o any devices. Displays rounded shoulder, some thoracic kyphosis   TREATMENT  DATE:  07/19/24 THERAPEUTIC EXERCISE: To improve strength, endurance, ROM, and flexibility.  Demonstration, verbal and tactile cues throughout for technique. NuStep-L5x55min  DKTC orange ball x 10 LTR on orange pball x 10 both ways Seated pball rollout NEUROMUSCULAR RE-EDUCATION: To improve coordination, kinesthesia, posture, and proprioception.  Standing hip abd x 20 BLE Standing hip ext x15 BLE Standing hip flexion x 20 BLE Seated pallof press GTB x 10 B Seated trunk rotation GTB x 10 B Supine single pedals BLE2x10 Bridges 2x10  07/17/24 THERAPEUTIC EXERCISE: To improve strength, endurance, ROM, and flexibility.  Demonstration, verbal and tactile cues throughout for technique. NuStep-L5x69min  Seated 3 way [employer] Outs w/ Green Pball w/ 10 holds  Seated HS Stretch 2x30  Supine LTRs x10 B, 3-5   Supine hip adduction + TrA- 2x10, 3: cued pt to breathe through entire movement  Supine Marches + TrA: 3x30  Bridges + TrA 2x10- p! From supine to sit  NEUROMUSCULAR RE-EDUCATION: To improve coordination, kinesthesia, posture, and proprioception.  Standing hip abd 2x10 B-cues for upright posture and dec'd hip ER to achieve motion Standing hip add x10 B Standing hip ext x10 B   07/12/24 THERAPEUTIC EXERCISE: To improve strength, endurance, ROM, and flexibility.  Demonstration, verbal and tactile cues throughout for technique.  NuStep- L5x6 min Supine LTR both ways x 10 B Seated R/L hamstring stretch x 1 min felt mor stretch with foot on stool  NEUROMUSCULAR RE-EDUCATION: To improve coordination, kinesthesia, posture, and proprioception.  Supine TrA + hip ABD GTB x 15 Supine TrA + marches GTB x 15 Supine TrA sets with breathing x 10 Bridges with TrA set 2x10     07/11/24 THERAPEUTIC EXERCISE: To improve strength, endurance, ROM, and flexibility.   Demonstration, verbal and tactile cues throughout for technique.  NuStep- L4x6 min SELF CARE: HEP review and revision   Pt reported some pain w/ lumbar L lateral flexion during ball roll outs, suggest widening the legs and decrease amount of reach   Added seated HS stretches to HEP see below   PHYSICAL PERFORMANCE TEST or MEASUREMENT:   Functional Gait  Assessment   Gait Level Surface Walks 20 ft in less than 7 sec but greater than 5.5 sec, uses assistive device, slower speed, mild gait deviations, or deviates 6-10 in outside of the 12 in walkway width.    Change in Gait Speed Able to smoothly change walking speed without loss of balance or gait deviation. Deviate no more than 6 in outside of the 12 in walkway width.    Gait with Horizontal Head Turns Performs head turns smoothly with slight change in gait velocity (eg, minor disruption to smooth gait path), deviates 6-10 in outside 12 in walkway width, or uses an assistive device.    Gait with Vertical Head Turns Performs task with slight change in gait velocity (eg, minor disruption to smooth gait path), deviates 6 - 10 in outside 12 in walkway width or uses assistive device    Gait and Pivot Turn Pivot turns safely within 3 sec and stops quickly with no loss of balance.    Step Over Obstacle Is able to step over 2 stacked shoe boxes taped together (9 in total height) without changing gait speed. No evidence of imbalance.    Gait with Narrow Base of Support Is able to ambulate for 10 steps heel to toe with no staggering.    Gait with Eyes Closed Walks 20 ft, slow speed, abnormal gait pattern, evidence for imbalance, deviates 10-15 in  outside 12 in walkway width. Requires more than 9 sec to ambulate 20 ft.    Ambulating Backwards Walks 20 ft, no assistive devices, good speed, no evidence for imbalance, normal gait    Steps Alternating feet, must use rail.    Total Score 24    FGA comment: 19-24 = medium risk fall        07/04/24 SELF CARE:   Reviewed eval findings and role of PT in addressing identified deficits as well as instruction in initial HEP (see below).    PATIENT EDUCATION:  Education details: HEP review, HEP update, and HEP modification - instructed on positioning for both stretches to avoid   Person educated: Patient Education method: Explanation, Demonstration, Verbal cues, and MedBridgeGO app access provided Education comprehension: verbalized understanding, returned demonstration, verbal cues required, and needs further education   HOME EXERCISE PROGRAM: Access Code: [email or web address] URL: [email or web address] Date: 07/12/2024 Prepared by: Sherice Ijames  Exercises - Seated 3 Way Exercise [employer] Out Stretch  - 1 x daily - 7 x weekly - 3 sets - 30 hold - Seated Hamstring Stretch (BKA)  - 1 x daily - 7 x weekly - 2 sets - 2 reps - 30 sec hold - Supine Posterior Pelvic Tilt  - 1 x daily - 7 x weekly - 2 sets - 10 reps - 5-10 sec hold - Hooklying Clamshell with Resistance  - 1 x daily - 7 x weekly - 2 sets - 10 reps - Supine March with Resistance Band  - 1 x daily - 7 x weekly - 2 sets - 10 reps  ASSESSMENT:  CLINICAL IMPRESSION: Continued progressing hip strength, mobility, and core stability exercises. Cues provided throughout session to correct form. He started session stiff in the back but increased mobility as the session went on. He has met STG #2. Deatrice will benefit from skilled PT intervention to address above deficits to improve mobility, function, and activity tolerance w/ dec'd pain interference.   EVAL: Clancy Mullarkey is a 78 y/o M referred to PT for evaluation and treatment of back pain. Pt states that his back pain is localized across his mid to lower back & sometimes seems to wrap around to his R abdominal area. Pt reports that he recalls his pain coming on after he had his gallbladder removed last October but, he does not know for sure if that is the cause of it. Pt says that his pain is  bothersome most with bending forward to pick up objects, rolling over in bed, and getting in and out of his car at times. Today's assessment findings include deficits in lumbar ROM, B LE strength, and LE flexibility (as shown in charts above). Pt reported inc'd pain with rotational movements of the lumbar spine. Noted mild tenderness of the pt's R thorocalumbar paraspinals > L. Pt stated that although he has not fully fallen, he often feels unsteady on his feet d/t his B peripheral neuropathy.  Dave scored a 38% on the Modified Oswestry Low Back Pain Disability Questionnaire which represents moderate level of disability in activities such as personal care, sleeping, traveling, and homemaking. [place] [facility]) will benefit from skilled PT intervention to address above deficits to improve mobility, function, and activity tolerance w/ dec'd pain interference.   OBJECTIVE IMPAIRMENTS: decreased activity tolerance, decreased balance, decreased endurance, decreased knowledge of condition, decreased mobility, difficulty walking, decreased ROM, decreased strength, increased fascial restrictions, impaired perceived functional ability, increased muscle spasms, impaired flexibility, impaired sensation, improper body mechanics, postural  dysfunction, and pain.   ACTIVITY LIMITATIONS: carrying, lifting, bending, sitting, standing, squatting, sleeping, stairs, transfers, bed mobility, bathing, dressing, and caring for others  PARTICIPATION LIMITATIONS: cleaning, laundry, driving, shopping, and community activity  PERSONAL FACTORS: Age, Fitness, Time since onset of injury/illness/exacerbation, and 3+ comorbidities: HTN; osteoporosis; OA; psoriatic arthritis; RA; cervical vertebral fracture; thyroid  disease - hypothyroidism; h/o Graves' disease; CKD; GERD; neuropathy are also affecting patient's functional outcome.   REHAB POTENTIAL: Good  CLINICAL DECISION MAKING: Evolving/moderate complexity  EVALUATION COMPLEXITY:  Moderate   GOALS: Goals reviewed with patient? Yes  SHORT TERM GOALS: Target date: 08/01/2024    Patient will be independent with initial HEP.  Baseline: Goal status: INITIAL  2.  Patient will report 25% improvement in pain to improve activity tolerance.  Baseline: best: 3/10, worst:8-9/10 Goal status: 07/19/24- MET  3.  Patient will perform FGA to establish current risk of fall.  Baseline: To be assessed  Goal status: MET- 07/11/2024   LONG TERM GOALS: Target date: 08/29/2024    Patient will be independent with advanced HEP  Baseline:  Goal status: INITIAL  2.  Patient will increase his score on the Mod Oswestry LBP Disability Questionnaire by at least 12% for improved in functional ability.  Baseline: 38%  Goal status: INITIAL  3.  Patient will report 50% improvement in back pain to improve patient's activity tolerance and participation in leisure activities (walking, archery). Baseline:  Goal status: INITIAL  4.  Patient will be able to ascend and descend at least 8 steps to ensure patient safety to negotiate stair at home.  Baseline:  Goal status: INITIAL  5.  Patient will be able to score at least a 28 on the FGA for safe community ambulation.  Baseline: 07/11/24- 24/30 Goal status: REVISED   6.  Patient will increase B LE strength grossly to 4+/5 to increase patient function and activity tolerance.  Baseline:  Goal status: INITIAL  PLAN:  PT FREQUENCY: 2x/week  PT DURATION: 8 weeks  PLANNED INTERVENTIONS: 97164- PT Re-evaluation, 97750- Physical Performance Testing, 97110-Therapeutic exercises, 97530- Therapeutic activity, [id number]- Neuromuscular re-education, 97535- Self Care, 02859- Manual therapy, [phone]- Gait training, [phone]- Aquatic Therapy, [phone]- Electrical stimulation (unattended), [id number]- Ultrasound, [id number]- Traction (mechanical), [id number]- Ionotophoresis 4mg /ml Dexamethasone , 79439 (1-2 muscles), [id number] (3+ muscles)- Dry Needling, Patient/Family education, Balance  training, Stair training, Taping, Joint mobilization, Spinal mobilization, Cryotherapy, and Moist heat.  PLAN FOR NEXT SESSION:  Review and update HEP as needed; Assess STGs; Continue Lumbar ROM exercises; LE, core/postural strengthening and stretching   Arsalan Brisbin L Mahek Schlesinger, PTA 07/19/2024, 2:03 PM

## 2024-07-24 ENCOUNTER — Encounter: Payer: Self-pay | Admitting: Podiatry

## 2024-07-24 ENCOUNTER — Ambulatory Visit (INDEPENDENT_AMBULATORY_CARE_PROVIDER_SITE_OTHER): Admitting: Podiatry

## 2024-07-24 VITALS — Ht 70.0 in | Wt 215.5 lb

## 2024-07-24 DIAGNOSIS — M79675 Pain in left toe(s): Secondary | ICD-10-CM

## 2024-07-24 DIAGNOSIS — B351 Tinea unguium: Secondary | ICD-10-CM | POA: Diagnosis not present

## 2024-07-24 DIAGNOSIS — N1832 Chronic kidney disease, stage 3b: Secondary | ICD-10-CM | POA: Diagnosis not present

## 2024-07-24 DIAGNOSIS — M79674 Pain in right toe(s): Secondary | ICD-10-CM

## 2024-07-24 NOTE — Progress Notes (Signed)
   Chief Complaint  Patient presents with   Nail Problem    Pt is here for RFC, callous removal and possible ingrown on right great toenail.    SUBJECTIVE Patient presents to office today complaining of elongated, thickened nails that cause pain while ambulating in shoes.  Patient is unable to trim their own nails. Patient is here for further evaluation and treatment.  Past Medical History:  Diagnosis Date   Allergy    Bronchitis    Cataract    Chronic kidney disease    Diverticulosis    FH: BPH (benign prostatic hypertrophy)    GERD (gastroesophageal reflux disease)    History of Graves' disease 01/19/2018   S/p ablation   HTN (hypertension)    Hyperlipidemia    Neuromuscular disorder (HCC)    Obesity    Psoriatic arthritis (HCC)    Rheumatoid arthritis (HCC) 08/07/2014   Sleep apnea    CPAP   Thyroid  disease     Allergies  Allergen Reactions   Codeine Other (See Comments)    Tension, nasty feeling    Nsaids Other (See Comments)    Renal insufficiency   Prednisone Other (See Comments)    Unknown/ sometimes takes with lower dose   Statins Other (See Comments)    Joint pain    Sulfa Antibiotics Other (See Comments)    Joint pain     OBJECTIVE General Patient is awake, alert, and oriented x 3 and in no acute distress. Derm Skin is dry and supple bilateral. Negative open lesions or macerations. Remaining integument unremarkable. Nails are tender, long, thickened and dystrophic with subungual debris, consistent with onychomycosis, 1-5 bilateral. No signs of infection noted. Vasc  DP and PT pedal pulses palpable bilaterally. Temperature gradient within normal limits.  Neuro Epicritic and protective threshold sensation grossly intact bilaterally.  Musculoskeletal Exam No symptomatic pedal deformities noted bilateral. Muscular strength within normal limits.  ASSESSMENT 1.  Pain due to onychomycosis of toenails both  PLAN OF CARE 1. Patient evaluated today.  2.  Instructed to maintain good pedal hygiene and foot care.  3. Mechanical debridement of nails 1-5 bilaterally performed using a nail nipper. Filed with dremel without incident.  4. Return to clinic in 3 mos.    Thresa EMERSON Sar, DPM [facility]  Dr. Thresa EMERSON Sar, DPM    [address] [place], [place] [address]                Office [phone]  Fax [phone]

## 2024-07-26 DIAGNOSIS — E669 Obesity, unspecified: Secondary | ICD-10-CM | POA: Diagnosis not present

## 2024-07-26 DIAGNOSIS — I129 Hypertensive chronic kidney disease with stage 1 through stage 4 chronic kidney disease, or unspecified chronic kidney disease: Secondary | ICD-10-CM | POA: Diagnosis not present

## 2024-07-26 DIAGNOSIS — N1832 Chronic kidney disease, stage 3b: Secondary | ICD-10-CM | POA: Diagnosis not present

## 2024-07-27 ENCOUNTER — Encounter: Payer: Self-pay | Admitting: Rehabilitation

## 2024-07-27 ENCOUNTER — Ambulatory Visit: Admitting: Rehabilitation

## 2024-07-27 DIAGNOSIS — R42 Dizziness and giddiness: Secondary | ICD-10-CM

## 2024-07-27 DIAGNOSIS — M5459 Other low back pain: Secondary | ICD-10-CM | POA: Diagnosis not present

## 2024-07-27 DIAGNOSIS — R262 Difficulty in walking, not elsewhere classified: Secondary | ICD-10-CM | POA: Diagnosis not present

## 2024-07-27 DIAGNOSIS — M6281 Muscle weakness (generalized): Secondary | ICD-10-CM | POA: Diagnosis not present

## 2024-07-27 DIAGNOSIS — M62830 Muscle spasm of back: Secondary | ICD-10-CM | POA: Diagnosis not present

## 2024-07-27 DIAGNOSIS — R293 Abnormal posture: Secondary | ICD-10-CM | POA: Diagnosis not present

## 2024-07-27 DIAGNOSIS — R2681 Unsteadiness on feet: Secondary | ICD-10-CM

## 2024-07-27 NOTE — Therapy (Signed)
 OUTPATIENT PHYSICAL THERAPY THORACOLUMBAR TREATMENT   Patient Name: BRADEN CIMO MRN: [id number] DOB:1946/11/14, 78 y.o., male Today's Date: 07/27/2024  END OF SESSION:  PT End of Session - 07/27/24 1533     Visit Number 6    Date for PT Re-Evaluation 08/29/24    Authorization Type Medicare & [place]  Life    PT Start Time 1535    PT Stop Time 1621    PT Time Calculation (min) 46 min    Activity Tolerance Patient tolerated treatment well    Behavior During Therapy WFL for tasks assessed/performed            Past Medical History:  Diagnosis Date   Allergy    Bronchitis    Cataract    Chronic kidney disease    Diverticulosis    FH: BPH (benign prostatic hypertrophy)    GERD (gastroesophageal reflux disease)    History of Graves' disease 01/19/2018   S/p ablation   HTN (hypertension)    Hyperlipidemia    Neuromuscular disorder (HCC)    Obesity    Psoriatic arthritis (HCC)    Rheumatoid arthritis (HCC) 08/07/2014   Sleep apnea    CPAP   Thyroid  disease    Past Surgical History:  Procedure Laterality Date   ABDOMINAL AORTOGRAM W/LOWER EXTREMITY N/A 01/14/2022   Procedure: ABDOMINAL AORTOGRAM W/LOWER EXTREMITY;  Surgeon: Darron Deatrice LABOR, MD;  Location: [facility];  Service: Cardiovascular;  Laterality: N/A;   ATRIAL FIBRILLATION ABLATION N/A 12/24/2022   Procedure: ATRIAL FIBRILLATION ABLATION;  Surgeon: Nancey Eulas BRAVO, MD;  Location: [facility];  Service: Cardiovascular;  Laterality: N/A;   CHOLECYSTECTOMY N/A 09/18/2023   Procedure: LAPAROSCOPIC CHOLECYSTECTOMY WITH INTRAOPERATIVE CHOLANGIOGRAM;  Surgeon: Kinsinger, Herlene Righter, MD;  Location: [facility];  Service: General;  Laterality: N/A;   INGUINAL HERNIA REPAIR     bilateral   KNEE SURGERY     bilateral arthroscopic   l foot surgery Left 12/2020   PACEMAKER IMPLANT N/A 12/13/2019   Procedure: PACEMAKER IMPLANT;  Surgeon: Waddell Danelle ORN, MD;  Location: [facility];  Service:  Cardiovascular;  Laterality: N/A;   TRANSURETHRAL RESECTION OF PROSTATE     UMBILICAL HERNIA REPAIR     Patient Active Problem List   Diagnosis Date Noted   B12 deficiency 04/05/2024   Obesity (BMI 30-39.9) 09/17/2023   Acute gallstone pancreatitis 09/16/2023   Benign paroxysmal positional vertigo 06/11/2023   Aortic atherosclerosis (HCC) 03/18/2021   Bruit 02/09/2021   Aortic valve sclerosis 02/09/2021   PCP notes >>>>>>>>>>>>>>> 01/10/2020   Age related osteoporosis 01/10/2020   CKD (chronic kidney disease), stage IV (HCC) 12/16/2019   Pacemaker 12/16/2019   Sick sinus syndrome (HCC) 12/16/2019   Paroxysmal atrial fibrillation (HCC) 10/26/2019   Nodule of flexor tendon sheath 04/13/2019   Hemorrhoids 06/24/2018   Low bone mass 01/19/2018   Iliac aneurysm (HCC) 06/16/2017   Hypertrophic cardiomyopathy (HCC) 06/16/2017   Essential hypertension 12/16/2016   Hyperlipidemia LDL goal <100 12/16/2016   Asthma in adult, mild intermittent, uncomplicated 10/12/2016   Collapsed vertebra, not elsewhere classified, cervical region, initial encounter for fracture (HCC) 10/08/2016   Annual physical exam 10/08/2016   Neuropathy 10/08/2016   Contracture of ankle and foot joint 08/07/2014   Esophageal reflux 08/07/2014   Organic impotence 08/07/2014   Obstructive sleep apnea 08/07/2014   Osteoarthritis 08/07/2014   Sensorineural hearing loss 08/07/2014   Tinnitus 08/07/2014   LVH (left ventricular hypertrophy) 12/25/2012   Diverticulosis 09/01/2011  Hypothyroidism 06/09/2011   Postablative hypothyroidism 06/09/2011   Psoriatic arthritis (HCC) 06/09/2011   BPH with obstruction/lower urinary tract symptoms 06/09/2011    PCP: Amon Aloysius BRAVO, MD   REFERRING PROVIDER: Amon Aloysius BRAVO, MD   REFERRING DIAG: M54.9 (ICD-10-CM) - Acute back pain, unspecified back location, unspecified back pain laterality   Rationale for Evaluation and Treatment: Rehabilitation  THERAPY DIAG:  Other low back  pain  Muscle spasm of back  Muscle weakness (generalized)  Abnormal posture  Unsteadiness on feet  Difficulty in walking, not elsewhere classified  Dizziness and giddiness  ONSET DATE: ~ Oct 2024  NEXT MD VISIT: 08/01/2024   SUBJECTIVE:                                                                                                                                                                                           SUBJECTIVE STATEMENT: 07/27/24:  Patient states has RUQ pain.   States has had it for years.  Has had his gall bladder out in past.  States he will mention it to Dr Amon when he sees him in next 1-2 weeks.   Rates pain today is 1-2/10 in the low back;  states pain at worse over last week is 6/10.  He describes the pain as radiating   EVAL: Pt reports pain across his back on both sides beneath ribs. He has his gallbladder removed (Oct 2024) and he thought that was the cause of his pain but he is unsure. The pain came on after that surgery that he can recall. He says that he thinks he woke up one morning with his pain. Difficulty w/ picking up things and getting in and out of car   PERTINENT HISTORY:  Atrial fibrillation ablation 12/24/22; SSS s/p pacemaker 12/13/19; B knee scope; L foot surgery; umbilical and B inguinal hernia repairs; HTN; osteoporosis; OA; psoriatic arthritis; RA; cervical vertebral fracture; thyroid  disease - hypothyroidism; h/o Graves' disease; CKD; GERD; neuropathy; hearing loss; acute vertigo as of early June   PAIN:  Are you having pain? Yes: NPRS scale: 1/10; 2/10 in R foot/heel Pain location: across lower back, beneath level of ribs , sometimes to R abdominal area  Pain description: constant, sharp, breath taking Aggravating factors: bending forward, rolling over in bed Relieving factors: muscle relaxers taken recently   PRECAUTIONS: ICD/Pacemaker  RED FLAGS: None   WEIGHT BEARING RESTRICTIONS: No  FALLS:  Has patient fallen in last 6  months? No-pt has come close d/t pain and neuropathy   LIVING ENVIRONMENT: Lives with: lives with their spouse Lives in: House/apartment Stairs: Yes: Internal: 8 or 7 steps; on right  going up and down and External: 1 steps; none Has following equipment at home: Quad cane small base and Grab bars  OCCUPATION: Retired  PLOF: Independent and Leisure: walking with wife at [facility], formerly participated in [employer] at the [employer]   PATIENT GOALS: no pain in general, return to exercise/walking w/o hurting, wants to get into archery   OBJECTIVE:  Note: Objective measures were completed at Evaluation unless otherwise noted.  DIAGNOSTIC FINDINGS:  EXAM: LUMBAR SPINE - COMPLETE 4+ VIEW; THORACIC SPINE 2 VIEW 06/14/24 FINDINGS: Right convex thoracolumbar curve.No acute fracture or evidence of traumatic listhesis in the thoracolumbar spine. Multilevel spondylosis with anterior osteophytes throughout the thoracic and lumbar spine. Intervertebral disc space height is maintained. Mild lower lumbar facet arthropathy.  IMPRESSION: 1. No acute fracture or traumatic listhesis in the thoracolumbar spine. 2. Mild multilevel spondylosis.  PATIENT SURVEYS:  Modified Oswestry: 38% Interpretation of scores: Score Category Description  0-20% Minimal Disability The patient can cope with most living activities. Usually no treatment is indicated apart from advice on lifting, sitting and exercise  21-40% Moderate Disability The patient experiences more pain and difficulty with sitting, lifting and standing. Travel and social life are more difficult and they may be disabled from work. Personal care, sexual activity and sleeping are not grossly affected, and the patient can usually be managed by conservative means  41-60% Severe Disability Pain remains the main problem in this group, but activities of daily living are affected. These patients require a detailed investigation  61-80%  Crippled Back pain impinges on all aspects of the patient's life. Positive intervention is required  81-100% Bed-bound  These patients are either bed-bound or exaggerating their symptoms  Bluford FORBES Zoe DELENA Karon DELENA, et al. Surgery versus conservative management of stable thoracolumbar fracture: the PRESTO feasibility RCT. [place] ([place]): [employer]; 2021 Nov. [facility] Technology Assessment, No. 25.62.) Appendix 3, Oswestry Disability Index category descriptors. Available from: FindJewelers.cz  Minimally Clinically Important Difference (MCID) = 12.8%  COGNITION: Overall cognitive status: Within functional limits for tasks assessed     SENSATION: WFL- pt has known peripheral neuropathy in his B feet, diminished sensation into legs some above the ankle    MUSCLE LENGTH: Hamstrings: Right mod tight; Left mod-severe tightness  Hip Flexors: R mod tight, L mod tight  Quads: R mod-severe, L mod severe Piriformis: L mod-severe tight, R mod tight  Hip IR: L mod-severe tight, R mod tight  POSTURE: rounded shoulders, forward head, increased lumbar lordosis, and increased thoracic kyphosis  PALPATION: TTP: R > L thoracolumbar paraspinals, tightness, QL (R>L tightness),   LUMBAR ROM:   AROM eval  Flexion WFL- bends knees to touch toes   Extension 25% limited   Right lateral flexion Mid calf   Left lateral flexion Mid calf  Right rotation Note p! On L (less than R)  Left rotation Limited p! On R side    (Blank rows = not tested)  LOWER EXTREMITY MMT:    MMT Right eval Left eval  Hip flexion 4+ 4+  Hip extension 3- 3-  Hip abduction 4- 4-  Hip adduction 4 3+  Hip internal rotation 4 4  Hip external rotation 4 4  Knee flexion 4+ 4+  Knee extension 5 5  Ankle dorsiflexion 4- 4-  Ankle plantarflexion    Ankle inversion    Ankle eversion     (Blank rows = not tested)  LUMBAR SPECIAL TESTS:  Not assessed   FUNCTIONAL  TESTS:  Not  assessed   GAIT: Distance walked: clinic distances  Assistive device utilized: None Level of assistance: Complete Independence Comments: pt ambulated into clinic w/o any devices. Displays rounded shoulder, some thoracic kyphosis   TREATMENT DATE:                                                                                                                               07/27/24: THERAPEUTIC EXERCISE: To improve strength, endurance, ROM, and flexibility.  Demonstration, verbal and tactile cues throughout for technique. NuStep-L5x67min   THERAPEUTIC ACTIVITIES: To improve functional performance.  Demonstration, verbal and tactile cues throughout for technique. L sidelying overhead reach RUE for SB stretch--no reproduction of pain Seated L SB w/ overhead reach RUE x 1' x 2 with some stretch Seated R SB overhead reach LUE --INCREASES PAIN the L thoracic spine Seated ham string stretch x 1' x 2 BLE  NEUROMUSCULAR RE-EDUCATION: To improve posture. Standing R abd wall slide/reach with L pelvic sideglide x 2/10 Thoracic rotation with hands on wall to R and to L with hip twist and end range for general jt mob x 20-30 each direction  MANUAL THERAPY: To promote reduced pain utilizing myofascial release. MFR to R thoracic paraspinals in L SL.   Supine manual PT assist SLR ham sttretch x 1' BLE  07/19/24 THERAPEUTIC EXERCISE: To improve strength, endurance, ROM, and flexibility.  Demonstration, verbal and tactile cues throughout for technique. NuStep-L5x39min  DKTC orange ball x 10 LTR on orange pball x 10 both ways Seated pball rollout NEUROMUSCULAR RE-EDUCATION: To improve coordination, kinesthesia, posture, and proprioception.  Standing hip abd x 20 BLE Standing hip ext x15 BLE Standing hip flexion x 20 BLE Seated pallof press GTB x 10 B Seated trunk rotation GTB x 10 B Supine single pedals BLE2x10 Bridges 2x10  07/17/24 THERAPEUTIC EXERCISE: To improve strength, endurance, ROM, and  flexibility.  Demonstration, verbal and tactile cues throughout for technique. NuStep-L5x85min  Seated 3 way [employer] w/ Green Pball w/ 10 holds  Seated HS Stretch 2x30  Supine LTRs x10 B, 3-5   Supine hip adduction + TrA- 2x10, 3: cued pt to breathe through entire movement  Supine Marches + TrA: 3x30  Bridges + TrA 2x10- p! From supine to sit  NEUROMUSCULAR RE-EDUCATION: To improve coordination, kinesthesia, posture, and proprioception.  Standing hip abd 2x10 B-cues for upright posture and dec'd hip ER to achieve motion Standing hip add x10 B Standing hip ext x10 B   07/12/24 THERAPEUTIC EXERCISE: To improve strength, endurance, ROM, and flexibility.  Demonstration, verbal and tactile cues throughout for technique.  NuStep- L5x6 min Supine LTR both ways x 10 B Seated R/L hamstring stretch x 1 min felt mor stretch with foot on stool  NEUROMUSCULAR RE-EDUCATION: To improve coordination, kinesthesia, posture, and proprioception.  Supine TrA + hip ABD GTB x 15 Supine TrA + marches GTB x 15 Supine TrA sets with breathing x 10 Bridges  with TrA set 2x10     07/11/24 THERAPEUTIC EXERCISE: To improve strength, endurance, ROM, and flexibility.  Demonstration, verbal and tactile cues throughout for technique.  NuStep- L4x6 min SELF CARE: HEP review and revision   Pt reported some pain w/ lumbar L lateral flexion during ball roll outs, suggest widening the legs and decrease amount of reach   Added seated HS stretches to HEP see below   PHYSICAL PERFORMANCE TEST or MEASUREMENT:   Functional Gait  Assessment   Gait Level Surface Walks 20 ft in less than 7 sec but greater than 5.5 sec, uses assistive device, slower speed, mild gait deviations, or deviates 6-10 in outside of the 12 in walkway width.    Change in Gait Speed Able to smoothly change walking speed without loss of balance or gait deviation. Deviate no more than 6 in outside of the 12 in walkway width.    Gait with Horizontal  Head Turns Performs head turns smoothly with slight change in gait velocity (eg, minor disruption to smooth gait path), deviates 6-10 in outside 12 in walkway width, or uses an assistive device.    Gait with Vertical Head Turns Performs task with slight change in gait velocity (eg, minor disruption to smooth gait path), deviates 6 - 10 in outside 12 in walkway width or uses assistive device    Gait and Pivot Turn Pivot turns safely within 3 sec and stops quickly with no loss of balance.    Step Over Obstacle Is able to step over 2 stacked shoe boxes taped together (9 in total height) without changing gait speed. No evidence of imbalance.    Gait with Narrow Base of Support Is able to ambulate for 10 steps heel to toe with no staggering.    Gait with Eyes Closed Walks 20 ft, slow speed, abnormal gait pattern, evidence for imbalance, deviates 10-15 in outside 12 in walkway width. Requires more than 9 sec to ambulate 20 ft.    Ambulating Backwards Walks 20 ft, no assistive devices, good speed, no evidence for imbalance, normal gait    Steps Alternating feet, must use rail.    Total Score 24    FGA comment: 19-24 = medium risk fall        07/04/24 SELF CARE:  Reviewed eval findings and role of PT in addressing identified deficits as well as instruction in initial HEP (see below).    PATIENT EDUCATION:  Education details: instructed on thoracic rotational and sidbending stretching  Person educated: Patient Education method: Explanation, Demonstration, Verbal cues, and MedBridgeGO app access provided Education comprehension: verbalized understanding, returned demonstration, verbal cues required, and needs further education   HOME EXERCISE PROGRAM: Access Code: [email or web address] URL: [email or web address] Date: 07/27/2024 Prepared by: Garnette Montclair  Exercises - Seated 3 Way Exercise Ball Roll Out Stretch  - 1 x daily - 7 x weekly - 3 sets - 30 hold - Seated Hamstring Stretch (BKA)  -  1 x daily - 7 x weekly - 2 sets - 2 reps - 30 sec hold - Supine Posterior Pelvic Tilt  - 1 x daily - 7 x weekly - 2 sets - 10 reps - 5-10 sec hold - Hooklying Clamshell with Resistance  - 1 x daily - 7 x weekly - 2 sets - 10 reps - Supine March with Resistance Band  - 1 x daily - 7 x weekly - 2 sets - 10 reps - Sidebending to Elbow Short Sit  - 1 x daily - 7  x weekly - 1 sets - 2 reps - 1 min hold - Seated Quadratus Lumborum Stretch in Chair  - 1 x daily - 7 x weekly - 1 sets - 2 reps - 1 min hold - Standing Lumbar Rotation Stretch  - 1 x daily - 7 x weekly - 3 sets - 10 reps ASSESSMENT:  CLINICAL IMPRESSION: 07/27/24:  Patient reports feels some discomfort in the RUQ, but states it is chronic.   He is going to see PCP in next 2 weeks and will mention this to him.   The pain seems to start in the R thoracic spine and radiate around to the R lateral and anterior ribs with rotational movements.   He also appears to have some L convex scoliosis with accentuated R paraspinal muscle mass.   Added SB and rotational stretching today for this issue.    EVAL: Devynn Scheff is a 78 y/o M referred to PT for evaluation and treatment of back pain. Pt states that his back pain is localized across his mid to lower back & sometimes seems to wrap around to his R abdominal area. Pt reports that he recalls his pain coming on after he had his gallbladder removed last October but, he does not know for sure if that is the cause of it. Pt says that his pain is bothersome most with bending forward to pick up objects, rolling over in bed, and getting in and out of his car at times. Today's assessment findings include deficits in lumbar ROM, B LE strength, and LE flexibility (as shown in charts above). Pt reported inc'd pain with rotational movements of the lumbar spine. Noted mild tenderness of the pt's R thorocalumbar paraspinals > L. Pt stated that although he has not fully fallen, he often feels unsteady on his feet d/t  his B peripheral neuropathy.  Dave scored a 38% on the Modified Oswestry Low Back Pain Disability Questionnaire which represents moderate level of disability in activities such as personal care, sleeping, traveling, and homemaking. [place] Dora) will benefit from skilled PT intervention to address above deficits to improve mobility, function, and activity tolerance w/ dec'd pain interference.   OBJECTIVE IMPAIRMENTS: decreased activity tolerance, decreased balance, decreased endurance, decreased knowledge of condition, decreased mobility, difficulty walking, decreased ROM, decreased strength, increased fascial restrictions, impaired perceived functional ability, increased muscle spasms, impaired flexibility, impaired sensation, improper body mechanics, postural dysfunction, and pain.   ACTIVITY LIMITATIONS: carrying, lifting, bending, sitting, standing, squatting, sleeping, stairs, transfers, bed mobility, bathing, dressing, and caring for others  PARTICIPATION LIMITATIONS: cleaning, laundry, driving, shopping, and community activity  PERSONAL FACTORS: Age, Fitness, Time since onset of injury/illness/exacerbation, and 3+ comorbidities: HTN; osteoporosis; OA; psoriatic arthritis; RA; cervical vertebral fracture; thyroid  disease - hypothyroidism; h/o Graves' disease; CKD; GERD; neuropathy are also affecting patient's functional outcome.   REHAB POTENTIAL: Good  CLINICAL DECISION MAKING: Evolving/moderate complexity  EVALUATION COMPLEXITY: Moderate   GOALS: Goals reviewed with patient? Yes  SHORT TERM GOALS: Target date: 08/01/2024    Patient will be independent with initial HEP.  Baseline: Goal status: INITIAL  2.  Patient will report 25% improvement in pain to improve activity tolerance.  Baseline: best: 3/10, worst:8-9/10 Goal status: 07/19/24- MET  3.  Patient will perform FGA to establish current risk of fall.  Baseline: To be assessed  Goal status: MET- 07/11/2024   LONG TERM  GOALS: Target date: 08/29/2024    Patient will be independent with advanced HEP  Baseline: 07/27/24:  added SB  and rotational strretching Goal status: IN PROGRESS  2.  Patient will increase his score on the Mod Oswestry LBP Disability Questionnaire by at least 12% for improved in functional ability.  Baseline: 38%  Goal status: INITIAL  3.  Patient will report 50% improvement in back pain to improve patient's activity tolerance and participation in leisure activities (walking, archery). Baseline: 8-9/10 worst 07/27/24:  6/10 worst over last week Goal status: IN PROGRESS  4.  Patient will be able to ascend and descend at least 8 steps to ensure patient safety to negotiate stair at home.  Baseline:  Goal status: INITIAL  5.  Patient will be able to score at least a 28 on the FGA for safe community ambulation.  Baseline: 07/11/24- 24/30 Goal status: REVISED   6.  Patient will increase B LE strength grossly to 4+/5 to increase patient function and activity tolerance.  Baseline:  Goal status: INITIAL  PLAN:  PT FREQUENCY: 2x/week  PT DURATION: 8 weeks  PLANNED INTERVENTIONS: 97164- PT Re-evaluation, 97750- Physical Performance Testing, 97110-Therapeutic exercises, 97530- Therapeutic activity, 97112- Neuromuscular re-education, 97535- Self Care, [id number]- Manual therapy, [phone]- Gait training, [phone]- Aquatic Therapy, [phone]- Electrical stimulation (unattended), [id number]- Ultrasound, [id number]- Traction (mechanical), [id number]- Ionotophoresis 4mg /ml Dexamethasone , 79439 (1-2 muscles), [id number] (3+ muscles)- Dry Needling, Patient/Family education, Balance training, Stair training, Taping, Joint mobilization, Spinal mobilization, Cryotherapy, and Moist heat.  PLAN FOR NEXT SESSION:  Do ODI; check MMT, assess if MFR and new stretches helped; try more rotational stretching/strengthening if it did;  thoracic joint mobilizations  Melisha Eggleton, PT 07/27/2024, 9:26 PM

## 2024-07-31 ENCOUNTER — Ambulatory Visit

## 2024-07-31 DIAGNOSIS — R262 Difficulty in walking, not elsewhere classified: Secondary | ICD-10-CM

## 2024-07-31 DIAGNOSIS — M6281 Muscle weakness (generalized): Secondary | ICD-10-CM

## 2024-07-31 DIAGNOSIS — M62830 Muscle spasm of back: Secondary | ICD-10-CM

## 2024-07-31 DIAGNOSIS — M5459 Other low back pain: Secondary | ICD-10-CM

## 2024-07-31 DIAGNOSIS — R2681 Unsteadiness on feet: Secondary | ICD-10-CM | POA: Diagnosis not present

## 2024-07-31 DIAGNOSIS — R293 Abnormal posture: Secondary | ICD-10-CM

## 2024-07-31 NOTE — Therapy (Signed)
 OUTPATIENT PHYSICAL THERAPY THORACOLUMBAR TREATMENT   Patient Name: Martin Rubio MRN: [id number] DOB:1946/11/16, 78 y.o., male Today's Date: 07/31/2024  END OF SESSION:  PT End of Session - 07/31/24 1326     Visit Number 7    Date for PT Re-Evaluation 08/29/24    Authorization Type Medicare & [place]  Life    PT Start Time 1315    PT Stop Time 1356    PT Time Calculation (min) 41 min    Activity Tolerance Patient tolerated treatment well    Behavior During Therapy [facility] for tasks assessed/performed             Past Medical History:  Diagnosis Date   Allergy    Bronchitis    Cataract    Chronic kidney disease    Diverticulosis    FH: BPH (benign prostatic hypertrophy)    GERD (gastroesophageal reflux disease)    History of Graves' disease 01/19/2018   S/p ablation   HTN (hypertension)    Hyperlipidemia    Neuromuscular disorder (HCC)    Obesity    Psoriatic arthritis (HCC)    Rheumatoid arthritis (HCC) 08/07/2014   Sleep apnea    CPAP   Thyroid  disease    Past Surgical History:  Procedure Laterality Date   ABDOMINAL AORTOGRAM W/LOWER EXTREMITY N/A 01/14/2022   Procedure: ABDOMINAL AORTOGRAM W/LOWER EXTREMITY;  Surgeon: Darron Deatrice LABOR, MD;  Location: [facility];  Service: Cardiovascular;  Laterality: N/A;   ATRIAL FIBRILLATION ABLATION N/A 12/24/2022   Procedure: ATRIAL FIBRILLATION ABLATION;  Surgeon: Nancey Eulas BRAVO, MD;  Location: [facility];  Service: Cardiovascular;  Laterality: N/A;   CHOLECYSTECTOMY N/A 09/18/2023   Procedure: LAPAROSCOPIC CHOLECYSTECTOMY WITH INTRAOPERATIVE CHOLANGIOGRAM;  Surgeon: Kinsinger, Herlene Righter, MD;  Location: [facility] OR;  Service: General;  Laterality: N/A;   INGUINAL HERNIA REPAIR     bilateral   KNEE SURGERY     bilateral arthroscopic   l foot surgery Left 12/2020   PACEMAKER IMPLANT N/A 12/13/2019   Procedure: PACEMAKER IMPLANT;  Surgeon: Waddell Danelle ORN, MD;  Location: [facility];  Service:  Cardiovascular;  Laterality: N/A;   TRANSURETHRAL RESECTION OF PROSTATE     UMBILICAL HERNIA REPAIR     Patient Active Problem List   Diagnosis Date Noted   B12 deficiency 04/05/2024   Obesity (BMI 30-39.9) 09/17/2023   Acute gallstone pancreatitis 09/16/2023   Benign paroxysmal positional vertigo 06/11/2023   Aortic atherosclerosis (HCC) 03/18/2021   Bruit 02/09/2021   Aortic valve sclerosis 02/09/2021   PCP notes >>>>>>>>>>>>>>> 01/10/2020   Age related osteoporosis 01/10/2020   CKD (chronic kidney disease), stage IV (HCC) 12/16/2019   Pacemaker 12/16/2019   Sick sinus syndrome (HCC) 12/16/2019   Paroxysmal atrial fibrillation (HCC) 10/26/2019   Nodule of flexor tendon sheath 04/13/2019   Hemorrhoids 06/24/2018   Low bone mass 01/19/2018   Iliac aneurysm (HCC) 06/16/2017   Hypertrophic cardiomyopathy (HCC) 06/16/2017   Essential hypertension 12/16/2016   Hyperlipidemia LDL goal <100 12/16/2016   Asthma in adult, mild intermittent, uncomplicated 10/12/2016   Collapsed vertebra, not elsewhere classified, cervical region, initial encounter for fracture (HCC) 10/08/2016   Annual physical exam 10/08/2016   Neuropathy 10/08/2016   Contracture of ankle and foot joint 08/07/2014   Esophageal reflux 08/07/2014   Organic impotence 08/07/2014   Obstructive sleep apnea 08/07/2014   Osteoarthritis 08/07/2014   Sensorineural hearing loss 08/07/2014   Tinnitus 08/07/2014   LVH (left ventricular hypertrophy) 12/25/2012   Diverticulosis 09/01/2011  Hypothyroidism 06/09/2011   Postablative hypothyroidism 06/09/2011   Psoriatic arthritis (HCC) 06/09/2011   BPH with obstruction/lower urinary tract symptoms 06/09/2011    PCP: Amon Aloysius BRAVO, MD   REFERRING PROVIDER: Amon Aloysius BRAVO, MD   REFERRING DIAG: M54.9 (ICD-10-CM) - Acute back pain, unspecified back location, unspecified back pain laterality   Rationale for Evaluation and Treatment: Rehabilitation  THERAPY DIAG:  Other low back  pain  Muscle spasm of back  Muscle weakness (generalized)  Abnormal posture  Unsteadiness on feet  Difficulty in walking, not elsewhere classified  ONSET DATE: ~ Oct 2024  NEXT MD VISIT: 08/01/2024   SUBJECTIVE:                                                                                                                                                                                           SUBJECTIVE STATEMENT: Pt reports the manual last session helped with pain.   EVAL: Pt reports pain across his back on both sides beneath ribs. He has his gallbladder removed (Oct 2024) and he thought that was the cause of his pain but he is unsure. The pain came on after that surgery that he can recall. He says that he thinks he woke up one morning with his pain. Difficulty w/ picking up things and getting in and out of car   PERTINENT HISTORY:  Atrial fibrillation ablation 12/24/22; SSS s/p pacemaker 12/13/19; B knee scope; L foot surgery; umbilical and B inguinal hernia repairs; HTN; osteoporosis; OA; psoriatic arthritis; RA; cervical vertebral fracture; thyroid  disease - hypothyroidism; h/o Graves' disease; CKD; GERD; neuropathy; hearing loss; acute vertigo as of early June   PAIN:  Are you having pain? Yes: NPRS scale: 1/10; 2/10 in R foot/heel Pain location: across lower back, beneath level of ribs , sometimes to R abdominal area  Pain description: constant, sharp, breath taking Aggravating factors: bending forward, rolling over in bed Relieving factors: muscle relaxers taken recently   PRECAUTIONS: ICD/Pacemaker  RED FLAGS: None   WEIGHT BEARING RESTRICTIONS: No  FALLS:  Has patient fallen in last 6 months? No-pt has come close d/t pain and neuropathy   LIVING ENVIRONMENT: Lives with: lives with their spouse Lives in: House/apartment Stairs: Yes: Internal: 8 or 7 steps; on right going up and down and External: 1 steps; none Has following equipment at home: Quad cane small  base and Grab bars  OCCUPATION: Retired  PLOF: Independent and Leisure: walking with wife at [facility], formerly participated in [employer] at the [employer]   PATIENT GOALS: no pain in general, return to exercise/walking w/o hurting, wants to get into  archery   OBJECTIVE:  Note: Objective measures were completed at Evaluation unless otherwise noted.  DIAGNOSTIC FINDINGS:  EXAM: LUMBAR SPINE - COMPLETE 4+ VIEW; THORACIC SPINE 2 VIEW 06/14/24 FINDINGS: Right convex thoracolumbar curve.No acute fracture or evidence of traumatic listhesis in the thoracolumbar spine. Multilevel spondylosis with anterior osteophytes throughout the thoracic and lumbar spine. Intervertebral disc space height is maintained. Mild lower lumbar facet arthropathy.  IMPRESSION: 1. No acute fracture or traumatic listhesis in the thoracolumbar spine. 2. Mild multilevel spondylosis.  PATIENT SURVEYS:  Modified Oswestry: 38% Interpretation of scores: Score Category Description  0-20% Minimal Disability The patient can cope with most living activities. Usually no treatment is indicated apart from advice on lifting, sitting and exercise  21-40% Moderate Disability The patient experiences more pain and difficulty with sitting, lifting and standing. Travel and social life are more difficult and they may be disabled from work. Personal care, sexual activity and sleeping are not grossly affected, and the patient can usually be managed by conservative means  41-60% Severe Disability Pain remains the main problem in this group, but activities of daily living are affected. These patients require a detailed investigation  61-80% Crippled Back pain impinges on all aspects of the patient's life. Positive intervention is required  81-100% Bed-bound  These patients are either bed-bound or exaggerating their symptoms  Bluford FORBES Zoe DELENA Karon DELENA, et al. Surgery versus conservative management of stable  thoracolumbar fracture: the PRESTO feasibility RCT. [place]): [employer]; 2021 Nov. [facility] Technology Assessment, No. 25.62.) Appendix 3, Oswestry Disability Index category descriptors. Available from: FindJewelers.cz  Minimally Clinically Important Difference (MCID) = 12.8%  COGNITION: Overall cognitive status: Within functional limits for tasks assessed     SENSATION: WFL- pt has known peripheral neuropathy in his B feet, diminished sensation into legs some above the ankle    MUSCLE LENGTH: Hamstrings: Right mod tight; Left mod-severe tightness  Hip Flexors: R mod tight, L mod tight  Quads: R mod-severe, L mod severe Piriformis: L mod-severe tight, R mod tight  Hip IR: L mod-severe tight, R mod tight  POSTURE: rounded shoulders, forward head, increased lumbar lordosis, and increased thoracic kyphosis  PALPATION: TTP: R > L thoracolumbar paraspinals, tightness, QL (R>L tightness),   LUMBAR ROM:   AROM eval  Flexion WFL- bends knees to touch toes   Extension 25% limited   Right lateral flexion Mid calf   Left lateral flexion Mid calf  Right rotation Note p! On L (less than R)  Left rotation Limited p! On R side    (Blank rows = not tested)  LOWER EXTREMITY MMT:    MMT Right eval Left eval  Hip flexion 4+ 4+  Hip extension 3- 3-  Hip abduction 4- 4-  Hip adduction 4 3+  Hip internal rotation 4 4  Hip external rotation 4 4  Knee flexion 4+ 4+  Knee extension 5 5  Ankle dorsiflexion 4- 4-  Ankle plantarflexion    Ankle inversion    Ankle eversion     (Blank rows = not tested)  LUMBAR SPECIAL TESTS:  Not assessed   FUNCTIONAL TESTS:  Not assessed   GAIT: Distance walked: clinic distances  Assistive device utilized: None Level of assistance: Complete Independence Comments: pt ambulated into clinic w/o any devices. Displays rounded shoulder, some thoracic kyphosis   TREATMENT DATE:  07/31/24: THERAPEUTIC EXERCISE: To improve strength, endurance, ROM, and flexibility.  Demonstration, verbal and tactile cues throughout for technique. L sidelying overhead reach RUE for SB stretch Seated L SB w/ overhead reach RUE x 1' x 2 with some stretch both sides Thoracic rotation with hands on wall to R and to L with hip twist and end range for general jt mob x 20-30 each direction Standing thoracic rotation RTB 10x5 B S/L R QL stretch x 1'  MANUAL THERAPY: To promote reduced pain utilizing myofascial release. MFR to R thoracic/lumbar paraspinals, lats in L SL.    07/27/24: THERAPEUTIC EXERCISE: To improve strength, endurance, ROM, and flexibility.  Demonstration, verbal and tactile cues throughout for technique. NuStep-L5x80min   THERAPEUTIC ACTIVITIES: To improve functional performance.  Demonstration, verbal and tactile cues throughout for technique. L sidelying overhead reach RUE for SB stretch--no reproduction of pain Seated L SB w/ overhead reach RUE x 1' x 2 with some stretch Seated R SB overhead reach LUE --INCREASES PAIN the L thoracic spine Seated ham string stretch x 1' x 2 BLE  NEUROMUSCULAR RE-EDUCATION: To improve posture. Standing R abd wall slide/reach with L pelvic sideglide x 2/10 Thoracic rotation with hands on wall to R and to L with hip twist and end range for general jt mob x 20-30 each direction  MANUAL THERAPY: To promote reduced pain utilizing myofascial release. MFR to R thoracic paraspinals in L SL.   Supine manual PT assist SLR ham sttretch x 1' BLE  07/19/24 THERAPEUTIC EXERCISE: To improve strength, endurance, ROM, and flexibility.  Demonstration, verbal and tactile cues throughout for technique. NuStep-L5x53min  DKTC orange ball x 10 LTR on orange pball x 10 both ways Seated pball rollout NEUROMUSCULAR RE-EDUCATION: To improve coordination, kinesthesia,  posture, and proprioception.  Standing hip abd x 20 BLE Standing hip ext x15 BLE Standing hip flexion x 20 BLE Seated pallof press GTB x 10 B Seated trunk rotation GTB x 10 B Supine single pedals BLE2x10 Bridges 2x10  07/17/24 THERAPEUTIC EXERCISE: To improve strength, endurance, ROM, and flexibility.  Demonstration, verbal and tactile cues throughout for technique. NuStep-L5x60min  Seated 3 way [employer] w/ Green Pball w/ 10 holds  Seated HS Stretch 2x30  Supine LTRs x10 B, 3-5   Supine hip adduction + TrA- 2x10, 3: cued pt to breathe through entire movement  Supine Marches + TrA: 3x30  Bridges + TrA 2x10- p! From supine to sit  NEUROMUSCULAR RE-EDUCATION: To improve coordination, kinesthesia, posture, and proprioception.  Standing hip abd 2x10 B-cues for upright posture and dec'd hip ER to achieve motion Standing hip add x10 B Standing hip ext x10 B   07/12/24 THERAPEUTIC EXERCISE: To improve strength, endurance, ROM, and flexibility.  Demonstration, verbal and tactile cues throughout for technique.  NuStep- L5x6 min Supine LTR both ways x 10 B Seated R/L hamstring stretch x 1 min felt mor stretch with foot on stool  NEUROMUSCULAR RE-EDUCATION: To improve coordination, kinesthesia, posture, and proprioception.  Supine TrA + hip ABD GTB x 15 Supine TrA + marches GTB x 15 Supine TrA sets with breathing x 10 Bridges with TrA set 2x10     07/11/24 THERAPEUTIC EXERCISE: To improve strength, endurance, ROM, and flexibility.  Demonstration, verbal and tactile cues throughout for technique.  NuStep- L4x6 min SELF CARE: HEP review and revision   Pt reported some pain w/ lumbar L lateral flexion during ball roll outs, suggest widening the legs and decrease amount of reach   Added  seated HS stretches to HEP see below   PHYSICAL PERFORMANCE TEST or MEASUREMENT:   Functional Gait  Assessment   Gait Level Surface Walks 20 ft in less than 7 sec but greater than 5.5 sec, uses  assistive device, slower speed, mild gait deviations, or deviates 6-10 in outside of the 12 in walkway width.    Change in Gait Speed Able to smoothly change walking speed without loss of balance or gait deviation. Deviate no more than 6 in outside of the 12 in walkway width.    Gait with Horizontal Head Turns Performs head turns smoothly with slight change in gait velocity (eg, minor disruption to smooth gait path), deviates 6-10 in outside 12 in walkway width, or uses an assistive device.    Gait with Vertical Head Turns Performs task with slight change in gait velocity (eg, minor disruption to smooth gait path), deviates 6 - 10 in outside 12 in walkway width or uses assistive device    Gait and Pivot Turn Pivot turns safely within 3 sec and stops quickly with no loss of balance.    Step Over Obstacle Is able to step over 2 stacked shoe boxes taped together (9 in total height) without changing gait speed. No evidence of imbalance.    Gait with Narrow Base of Support Is able to ambulate for 10 steps heel to toe with no staggering.    Gait with Eyes Closed Walks 20 ft, slow speed, abnormal gait pattern, evidence for imbalance, deviates 10-15 in outside 12 in walkway width. Requires more than 9 sec to ambulate 20 ft.    Ambulating Backwards Walks 20 ft, no assistive devices, good speed, no evidence for imbalance, normal gait    Steps Alternating feet, must use rail.    Total Score 24    FGA comment: 19-24 = medium risk fall        07/04/24 SELF CARE:  Reviewed eval findings and role of PT in addressing identified deficits as well as instruction in initial HEP (see below).    PATIENT EDUCATION:  Education details: instructed on thoracic rotational and sidbending stretching  Person educated: Patient Education method: Explanation, Demonstration, Verbal cues, and MedBridgeGO app access provided Education comprehension: verbalized understanding, returned demonstration, verbal cues required, and needs  further education   HOME EXERCISE PROGRAM: Access Code: [email or web address] URL: [email or web address] Date: 07/27/2024 Prepared by: Garnette Montclair  Exercises - Seated 3 Way Exercise Ball Roll Out Stretch  - 1 x daily - 7 x weekly - 3 sets - 30 hold - Seated Hamstring Stretch (BKA)  - 1 x daily - 7 x weekly - 2 sets - 2 reps - 30 sec hold - Supine Posterior Pelvic Tilt  - 1 x daily - 7 x weekly - 2 sets - 10 reps - 5-10 sec hold - Hooklying Clamshell with Resistance  - 1 x daily - 7 x weekly - 2 sets - 10 reps - Supine March with Resistance Band  - 1 x daily - 7 x weekly - 2 sets - 10 reps - Sidebending to Elbow Short Sit  - 1 x daily - 7 x weekly - 1 sets - 2 reps - 1 min hold - Seated Quadratus Lumborum Stretch in Chair  - 1 x daily - 7 x weekly - 1 sets - 2 reps - 1 min hold - Standing Lumbar Rotation Stretch  - 1 x daily - 7 x weekly - 3 sets - 10 reps ASSESSMENT:  CLINICAL IMPRESSION: Progressed with  thoraco-lumbar flexibility with good tolerance today. Provided handouts for HEP progressions last session. Continues to report pain in R lat area but improved after MT.  EVAL: Alek Borges is a 78 y/o M referred to PT for evaluation and treatment of back pain. Pt states that his back pain is localized across his mid to lower back & sometimes seems to wrap around to his R abdominal area. Pt reports that he recalls his pain coming on after he had his gallbladder removed last October but, he does not know for sure if that is the cause of it. Pt says that his pain is bothersome most with bending forward to pick up objects, rolling over in bed, and getting in and out of his car at times. Today's assessment findings include deficits in lumbar ROM, B LE strength, and LE flexibility (as shown in charts above). Pt reported inc'd pain with rotational movements of the lumbar spine. Noted mild tenderness of the pt's R thorocalumbar paraspinals > L. Pt stated that although he has not fully  fallen, he often feels unsteady on his feet d/t his B peripheral neuropathy.  Dave scored a 38% on the Modified Oswestry Low Back Pain Disability Questionnaire which represents moderate level of disability in activities such as personal care, sleeping, traveling, and homemaking. Montgomery Dora) will benefit from skilled PT intervention to address above deficits to improve mobility, function, and activity tolerance w/ dec'd pain interference.   OBJECTIVE IMPAIRMENTS: decreased activity tolerance, decreased balance, decreased endurance, decreased knowledge of condition, decreased mobility, difficulty walking, decreased ROM, decreased strength, increased fascial restrictions, impaired perceived functional ability, increased muscle spasms, impaired flexibility, impaired sensation, improper body mechanics, postural dysfunction, and pain.   ACTIVITY LIMITATIONS: carrying, lifting, bending, sitting, standing, squatting, sleeping, stairs, transfers, bed mobility, bathing, dressing, and caring for others  PARTICIPATION LIMITATIONS: cleaning, laundry, driving, shopping, and community activity  PERSONAL FACTORS: Age, Fitness, Time since onset of injury/illness/exacerbation, and 3+ comorbidities: HTN; osteoporosis; OA; psoriatic arthritis; RA; cervical vertebral fracture; thyroid  disease - hypothyroidism; h/o Graves' disease; CKD; GERD; neuropathy are also affecting patient's functional outcome.   REHAB POTENTIAL: Good  CLINICAL DECISION MAKING: Evolving/moderate complexity  EVALUATION COMPLEXITY: Moderate   GOALS: Goals reviewed with patient? Yes  SHORT TERM GOALS: Target date: 08/01/2024    Patient will be independent with initial HEP.  Baseline: Goal status: MET_ 07/31/24  2.  Patient will report 25% improvement in pain to improve activity tolerance.  Baseline: best: 3/10, worst:8-9/10 Goal status: 07/19/24- MET  3.  Patient will perform FGA to establish current risk of fall.  Baseline: To be  assessed  Goal status: MET- 07/11/2024   LONG TERM GOALS: Target date: 08/29/2024    Patient will be independent with advanced HEP  Baseline: 07/27/24:  added SB and rotational strretching Goal status: IN PROGRESS  2.  Patient will increase his score on the Mod Oswestry LBP Disability Questionnaire by at least 12% for improved in functional ability.  Baseline: 38%  Goal status: INITIAL  3.  Patient will report 50% improvement in back pain to improve patient's activity tolerance and participation in leisure activities (walking, archery). Baseline: 8-9/10 worst 07/27/24:  6/10 worst over last week Goal status: IN PROGRESS  4.  Patient will be able to ascend and descend at least 8 steps to ensure patient safety to negotiate stair at home.  Baseline:  Goal status: INITIAL  5.  Patient will be able to score at least a 28 on the FGA for safe community  ambulation.  Baseline: 07/11/24- 24/30 Goal status: REVISED   6.  Patient will increase B LE strength grossly to 4+/5 to increase patient function and activity tolerance.  Baseline:  Goal status: INITIAL  PLAN:  PT FREQUENCY: 2x/week  PT DURATION: 8 weeks  PLANNED INTERVENTIONS: 97164- PT Re-evaluation, 97750- Physical Performance Testing, 97110-Therapeutic exercises, 97530- Therapeutic activity, 97112- Neuromuscular re-education, 97535- Self Care, 02859- Manual therapy, [phone]- Gait training, [phone]- Aquatic Therapy, [phone]- Electrical stimulation (unattended), [id number]- Ultrasound, [id number]- Traction (mechanical), [id number]- Ionotophoresis 4mg /ml Dexamethasone , 79439 (1-2 muscles), 20561 (3+ muscles)- Dry Needling, Patient/Family education, Balance training, Stair training, Taping, Joint mobilization, Spinal mobilization, Cryotherapy, and Moist heat.  PLAN FOR NEXT SESSION:  Do ODI; check MMT, assess if MFR and new stretches helped; try more rotational stretching/strengthening if it did;  thoracic joint mobilizations  Onica Davidovich L Lerlene Treadwell, PTA 07/31/2024,  2:06 PM

## 2024-08-01 ENCOUNTER — Telehealth: Payer: Self-pay

## 2024-08-01 ENCOUNTER — Encounter: Payer: Self-pay | Admitting: Internal Medicine

## 2024-08-01 ENCOUNTER — Ambulatory Visit (HOSPITAL_BASED_OUTPATIENT_CLINIC_OR_DEPARTMENT_OTHER)
Admission: RE | Admit: 2024-08-01 | Discharge: 2024-08-01 | Disposition: A | Discharge status: Home / Self Care | Source: Ambulatory Visit | Attending: Internal Medicine | Admitting: Internal Medicine

## 2024-08-01 ENCOUNTER — Other Ambulatory Visit (HOSPITAL_COMMUNITY): Payer: Self-pay

## 2024-08-01 ENCOUNTER — Ambulatory Visit (INDEPENDENT_AMBULATORY_CARE_PROVIDER_SITE_OTHER): Admitting: Internal Medicine

## 2024-08-01 VITALS — BP 108/78 | HR 88 | Temp 97.7°F | Resp 18 | Ht 70.0 in | Wt 213.4 lb

## 2024-08-01 DIAGNOSIS — I700 Atherosclerosis of aorta: Secondary | ICD-10-CM | POA: Diagnosis not present

## 2024-08-01 DIAGNOSIS — R0789 Other chest pain: Secondary | ICD-10-CM

## 2024-08-01 DIAGNOSIS — E039 Hypothyroidism, unspecified: Secondary | ICD-10-CM

## 2024-08-01 DIAGNOSIS — R739 Hyperglycemia, unspecified: Secondary | ICD-10-CM

## 2024-08-01 DIAGNOSIS — L4050 Arthropathic psoriasis, unspecified: Secondary | ICD-10-CM

## 2024-08-01 DIAGNOSIS — G629 Polyneuropathy, unspecified: Secondary | ICD-10-CM | POA: Diagnosis not present

## 2024-08-01 DIAGNOSIS — Z950 Presence of cardiac pacemaker: Secondary | ICD-10-CM

## 2024-08-01 DIAGNOSIS — I5032 Chronic diastolic (congestive) heart failure: Secondary | ICD-10-CM | POA: Insufficient documentation

## 2024-08-01 DIAGNOSIS — I495 Sick sinus syndrome: Secondary | ICD-10-CM | POA: Diagnosis not present

## 2024-08-01 DIAGNOSIS — I480 Paroxysmal atrial fibrillation: Secondary | ICD-10-CM | POA: Diagnosis not present

## 2024-08-01 DIAGNOSIS — N1832 Chronic kidney disease, stage 3b: Secondary | ICD-10-CM

## 2024-08-01 DIAGNOSIS — I10 Essential (primary) hypertension: Secondary | ICD-10-CM | POA: Diagnosis not present

## 2024-08-01 DIAGNOSIS — E890 Postprocedural hypothyroidism: Secondary | ICD-10-CM

## 2024-08-01 NOTE — Telephone Encounter (Addendum)
 Called Shardei back (she did not introduce herself when she answered, I had to ask where she was calling from)- [employer] (spelling?) she states they are a healthcare advocate. Informed I wasn't comfortable even giving her the patient name and DOB that I was calling back about. Informed that we would need a written, signed consent from the Pt to discuss his medical condition. Our fax number was given to her.

## 2024-08-01 NOTE — Telephone Encounter (Signed)
 Pharmacy Patient Advocate Encounter   Received notification from CoverMyMeds that prior authorization for Esomeprazole  Magnesium  40MG  dr capsules is required/requested.   Insurance verification completed.   The patient is insured through [employer] .   Per test claim: PA required; PA submitted to above mentioned insurance via Latent Key/confirmation #/[id number] Status is pending

## 2024-08-01 NOTE — Patient Instructions (Addendum)
 Vaccines are recommended Flu shot every fall A COVID booster RSV  GO TO THE LAB :  Get the blood work   Your results will be posted on MyChart with my comments  Go to the front desk for the checkout Please make an appointment for a checkup in 4 months

## 2024-08-01 NOTE — Progress Notes (Unsigned)
 Subjective:    Patient ID: Martin Rubio, male    DOB: 1946-07-20, 78 y.o.   MRN: [id number]  DOS:  08/01/2024 Type of visit - description: Follow-up  Multiple concerns. Since  had surgery for gallbladder October 2020 has an ill-defined pain at the right rib cage, anterior and laterally. Denies fever or chills No nausea or vomiting Unclear if pain changes with movements or food.  Neuropathy if still  an issue.  Feels frustrated about the lack of improvement.  On Xarelto , denies blood in the stool or in the urine  Denies chest pain or difficulty breathing.  No edema or palpitations.  BP well-controlled, although adherence to medication is not very good.   BP Readings from Last 3 Encounters:  08/01/24 108/78  06/14/24 138/86  06/07/24 128/76     Review of Systems See above   Past Medical History:  Diagnosis Date   Allergy    Bronchitis    Cataract    Chronic kidney disease    Diverticulosis    FH: BPH (benign prostatic hypertrophy)    GERD (gastroesophageal reflux disease)    History of Graves' disease 01/19/2018   S/p ablation   HTN (hypertension)    Hyperlipidemia    Neuromuscular disorder (HCC)    Obesity    Psoriatic arthritis (HCC)    Rheumatoid arthritis (HCC) 08/07/2014   Sleep apnea    CPAP   Thyroid  disease     Past Surgical History:  Procedure Laterality Date   ABDOMINAL AORTOGRAM W/LOWER EXTREMITY N/A 01/14/2022   Procedure: ABDOMINAL AORTOGRAM W/LOWER EXTREMITY;  Surgeon: Darron Deatrice LABOR, MD;  Location: [facility];  Service: Cardiovascular;  Laterality: N/A;   ATRIAL FIBRILLATION ABLATION N/A 12/24/2022   Procedure: ATRIAL FIBRILLATION ABLATION;  Surgeon: Nancey Eulas BRAVO, MD;  Location: [facility];  Service: Cardiovascular;  Laterality: N/A;   CHOLECYSTECTOMY N/A 09/18/2023   Procedure: LAPAROSCOPIC CHOLECYSTECTOMY WITH INTRAOPERATIVE CHOLANGIOGRAM;  Surgeon: Kinsinger, Herlene Righter, MD;  Location: [facility];  Service: General;   Laterality: N/A;   INGUINAL HERNIA REPAIR     bilateral   KNEE SURGERY     bilateral arthroscopic   l foot surgery Left 12/2020   PACEMAKER IMPLANT N/A 12/13/2019   Procedure: PACEMAKER IMPLANT;  Surgeon: Waddell Danelle ORN, MD;  Location: [facility];  Service: Cardiovascular;  Laterality: N/A;   TRANSURETHRAL RESECTION OF PROSTATE     UMBILICAL HERNIA REPAIR      Current Outpatient Medications  Medication Instructions   alfuzosin  (UROXATRAL ) 10 mg, Oral, Daily with breakfast   amLODipine  (NORVASC ) 5 mg, Oral, Daily   cholecalciferol  (VITAMIN D3) 2,000 Units, Daily   diclofenac  Sodium (VOLTAREN ) 4 g, Topical, 2 times daily   dicyclomine  (BENTYL ) 20 MG tablet TAKE ONE TABLET BY MOUTH EVERY 6 HOURS AS NEEDED FOR ABDOMINAL CRAMPING/DIARRHEA   dofetilide  (TIKOSYN ) 250 mcg, Oral, 2 times daily   esomeprazole  (NEXIUM ) 40 mg, Oral, 2 times daily before meals, For 4 weeks, then decrease to once a day dose   etanercept (ENBREL SURECLICK) 50 MG/ML injection Per rheumatology   Evolocumab  (REPATHA  SURECLICK) 140 MG/ML SOAJ INJECT 140 MG UNDER THE SKIN EVERY TWO WEEKS INTO THE THIGH, STOMACH, OR UPPER ARM   famotidine  (PEPCID ) 40 mg, Oral, Daily at bedtime   ferrous fumarate  (FERRETTS) 325 (106 Fe) MG TABS tablet 106 mg of iron, Oral, 2 times daily   fluticasone  (FLONASE ) 50 MCG/ACT nasal spray 1 spray, As needed   hydrOXYzine  (VISTARIL ) 25  mg, Oral, Every 8 hours PRN   levothyroxine  (SYNTHROID ) 137 mcg, Oral, Daily before breakfast   losartan  (COZAAR ) 50 MG tablet TAKE ONE TABLET BY MOUTH EVERY MORNING AND TAKE ONE TABLET BY MOUTH AT BEDTIME   meclizine  (ANTIVERT ) 25 mg, 3 times daily PRN   melatonin 5 mg, Daily at bedtime   methocarbamol  (ROBAXIN ) 500 mg, Oral, Every 8 hours PRN   OVER THE COUNTER MEDICATION 2 capsules, Daily   oxybutynin  (DITROPAN -XL) 10 mg, Daily   rivaroxaban  (XARELTO ) 20 mg, Oral, Daily with supper   sildenafil  (REVATIO ) 20 MG tablet Take 1 tablet  by mouth 30-60  minutes before intercourse   tadalafil (CIALIS) 20 mg, Daily PRN   triamcinolone  cream (KENALOG ) 0.1 % 1 application , Daily PRN   vardenafil  (LEVITRA ) 20 mg, Oral, Daily PRN   Vitamin B 12 1,000 mcg, Daily       Objective:   Physical Exam Abdominal:     BP 108/78   Pulse 88   Temp 97.7 F (36.5 C) (Oral)   Resp 18   Ht 5' 10 (1.778 m)   Wt 213 lb 6 oz (96.8 kg)   SpO2 95%   BMI 30.62 kg/m  General:   Well developed, NAD, BMI noted. HEENT:  Normocephalic . Face symmetric, atraumatic Lungs:  CTA B Normal respiratory effort, no intercostal retractions, no accessory muscle use. Heart: RRR,  no murmur.  Lower extremities: no pretibial edema bilaterally  Skin: Not pale. Not jaundice Neurologic:  alert & oriented X3.  Speech normal, gait appropriate for age and unassisted Psych--  Cognition and judgment appear intact.  Cooperative with normal attention span and concentration.  Behavior appropriate. No anxious or depressed appearing.      Assessment    ASSESSMENT  (transfer to me 12/2019) Hyperglycemia HTN High cholesterol, seen at the lipid clinic Hypothyroidism Psoriatic Arthritis Dr Mai  Osteoporosis:   T score 02-2018 (-) 1.7, T score 9-20 19: (-) 1.5, Fosamax  Rx by previous PCP, unclear exactly when but started a  holiday starting 2022 CKD mild,stable . Creatinine ~1.5 CV: -Paroxysmal A. Fib, dx 09/2019 anticoagulated.  Ablation 12/24/2022. -Sick sinus syndrome, s/p pacemaker (12/13/2019) -Left common iliac aneurysm, AAA  - PVD OSA on CPAP BPH - sees urology Neuropathy : gabapentin  d/c d/t s/e; self d/c lyrica  , sees neurology Iron deficiency anemia: 12/02/2021 >> EGD (several polyps suggestive of FGP), C-scope:no polyps, no cancer. Pathology: benign H/o ocular migraines , dx by opht  per pt  B12 deficiency HOH  PLAN Hyperglycemia, check A1c HTN: Reports BPs are typically okay, on losartan  50 mg twice daily and amlodipine  5 mg daily.  Reports he  frequently skips amlodipine  if BP is low, like today, 108/78.  Advised to take amlodipine  daily, offered to decrease dose to 2.5 mg, strongly declined. Hypothyroidism: On Synthroid , check TSH GERD: Still symptomatic, plans to discuss with GI Psoriatic arthritis: Reports she is on Humira, to see rheumatologist soon. Paroxysmal atrial fibrillation, sick sinus syndrome, pacemaker, anticoagulated: Essentially asymptomatic, no evidence of bleeding. CKD 3B: LOV renal 07/26/2024.  Next visit 6 months.  Creatinine 1.6. Neuropathy: Long h/o neuropathy, had a NCS remotely , he is frustrated about the lack of improvement.  Advised patient that this is a very difficult condition to treat and to my knowledge there is no cure unless specific causes to found. Encouraged to continue seeing neurology, perhaps another NCS or other studies are appropriate.  Excerpts from the last neurology report: Etiology of polyneuropathy not determined.  He did not tolerate gabapentin  or Lyrica  well.    Lamotrigine  had not helped.  As he has BPH so will avoid duloxetine. Trial of alpha lipoic acid 600 mg bid.   If not better after a month consider lacosamide 50 or 100 mg po bid or oxcarbazepine 300 mg 2-3 times a day Will check a folic acid  noting that the B12 deficiency is treated  Osteoporosis: DEXA pending, to be done in few days, on vitamin D . Right chest wall pain: Chronic since he had a gallbladder surgery, essentially no GI symptoms.  Plan --Check a chest x-ray. Vaccine advice provided RTC 4 months   Time spent 40 minutes. Multiple issues addressed, particularly neuropathy.  Chart reviewed, listening therapy provided, explained the patient quite neuropathy is difficult to treat and encouraged him to continue working with neurology.  Also encouraged better compliance with BP medications.

## 2024-08-01 NOTE — Telephone Encounter (Signed)
 Copied from CRM [id number]. Topic: General - Other >> Aug 01, 2024  1:27 PM Thersia BROCKS wrote: Reason for CRM: [facility] Advocate with [facility] ([place] would like for Dr.Paz or nurse to give her a callback regarding  referral neurology  [phone]

## 2024-08-02 ENCOUNTER — Encounter: Payer: Self-pay | Admitting: Rehabilitation

## 2024-08-02 ENCOUNTER — Ambulatory Visit: Admitting: Rehabilitation

## 2024-08-02 DIAGNOSIS — R2681 Unsteadiness on feet: Secondary | ICD-10-CM

## 2024-08-02 DIAGNOSIS — M5459 Other low back pain: Secondary | ICD-10-CM | POA: Diagnosis not present

## 2024-08-02 DIAGNOSIS — M6281 Muscle weakness (generalized): Secondary | ICD-10-CM

## 2024-08-02 DIAGNOSIS — M62830 Muscle spasm of back: Secondary | ICD-10-CM | POA: Diagnosis not present

## 2024-08-02 DIAGNOSIS — R262 Difficulty in walking, not elsewhere classified: Secondary | ICD-10-CM | POA: Diagnosis not present

## 2024-08-02 DIAGNOSIS — R293 Abnormal posture: Secondary | ICD-10-CM

## 2024-08-02 LAB — HEMOGLOBIN A1C: Hgb A1c MFr Bld: 5.9 % (ref 4.6–6.5)

## 2024-08-02 LAB — FOLATE: Folate: 20.5 ng/mL (ref >5.9)

## 2024-08-02 LAB — TSH: TSH: 0.68 u[IU]/mL (ref 0.35–5.50)

## 2024-08-02 NOTE — Telephone Encounter (Signed)
 Pharmacy Patient Advocate Encounter  Received notification from [employer] that Prior Authorization for  Esomeprazole  Magnesium  40MG  dr capsules has been APPROVED from 08-01-2024 to 12-06-2024   PA #/Case ID/Reference #: [id number]

## 2024-08-02 NOTE — Therapy (Addendum)
 OUTPATIENT PHYSICAL THERAPY THORACOLUMBAR TREATMENT   Patient Name: Martin Rubio MRN: [id number] DOB:11/22/1946, 78 y.o., male Today's Date: 08/02/2024  END OF SESSION:  PT End of Session - 08/02/24 1149     Visit Number 8    Date for PT Re-Evaluation 08/29/24    Authorization Type Medicare & [place]  Life    PT Start Time 1148    PT Stop Time 1235    PT Time Calculation (min) 47 min    Activity Tolerance Patient tolerated treatment well    Behavior During Therapy [facility] for tasks assessed/performed             Past Medical History:  Diagnosis Date   Allergy    Bronchitis    Cataract    Chronic kidney disease    Diverticulosis    FH: BPH (benign prostatic hypertrophy)    GERD (gastroesophageal reflux disease)    History of Graves' disease 01/19/2018   S/p ablation   HTN (hypertension)    Hyperlipidemia    Neuromuscular disorder (HCC)    Obesity    Psoriatic arthritis (HCC)    Rheumatoid arthritis (HCC) 08/07/2014   Sleep apnea    CPAP   Thyroid  disease    Past Surgical History:  Procedure Laterality Date   ABDOMINAL AORTOGRAM W/LOWER EXTREMITY N/A 01/14/2022   Procedure: ABDOMINAL AORTOGRAM W/LOWER EXTREMITY;  Surgeon: Darron Deatrice LABOR, MD;  Location: [facility];  Service: Cardiovascular;  Laterality: N/A;   ATRIAL FIBRILLATION ABLATION N/A 12/24/2022   Procedure: ATRIAL FIBRILLATION ABLATION;  Surgeon: Nancey Eulas BRAVO, MD;  Location: [facility];  Service: Cardiovascular;  Laterality: N/A;   CHOLECYSTECTOMY N/A 09/18/2023   Procedure: LAPAROSCOPIC CHOLECYSTECTOMY WITH INTRAOPERATIVE CHOLANGIOGRAM;  Surgeon: Kinsinger, Herlene Righter, MD;  Location: [facility] OR;  Service: General;  Laterality: N/A;   INGUINAL HERNIA REPAIR     bilateral   KNEE SURGERY     bilateral arthroscopic   l foot surgery Left 12/2020   PACEMAKER IMPLANT N/A 12/13/2019   Procedure: PACEMAKER IMPLANT;  Surgeon: Waddell Danelle ORN, MD;  Location: [facility];  Service:  Cardiovascular;  Laterality: N/A;   TRANSURETHRAL RESECTION OF PROSTATE     UMBILICAL HERNIA REPAIR     Patient Active Problem List   Diagnosis Date Noted   Chronic diastolic heart failure (HCC) 08/01/2024   B12 deficiency 04/05/2024   Obesity (BMI 30-39.9) 09/17/2023   Benign paroxysmal positional vertigo 06/11/2023   Aortic atherosclerosis (HCC) 03/18/2021   Bruit 02/09/2021   Aortic valve sclerosis 02/09/2021   PCP notes >>>>>>>>>>>>>>> 01/10/2020   Age related osteoporosis 01/10/2020   CKD (chronic kidney disease), stage IV (HCC) 12/16/2019   Pacemaker 12/16/2019   Sick sinus syndrome (HCC) 12/16/2019   Paroxysmal atrial fibrillation (HCC) 10/26/2019   Nodule of flexor tendon sheath 04/13/2019   Low bone mass 01/19/2018   Iliac aneurysm (HCC) 06/16/2017   Hypertrophic cardiomyopathy (HCC) 06/16/2017   Essential hypertension 12/16/2016   Hyperlipidemia LDL goal <100 12/16/2016   Asthma in adult, mild intermittent, uncomplicated 10/12/2016   Collapsed vertebra, not elsewhere classified, cervical region, initial encounter for fracture (HCC) 10/08/2016   Annual physical exam 10/08/2016   Neuropathy 10/08/2016   Contracture of ankle and foot joint 08/07/2014   Esophageal reflux 08/07/2014   Organic impotence 08/07/2014   Obstructive sleep apnea 08/07/2014   Osteoarthritis 08/07/2014   Sensorineural hearing loss 08/07/2014   Tinnitus 08/07/2014   LVH (left ventricular hypertrophy) 12/25/2012   Diverticulosis 09/01/2011  Hypothyroidism 06/09/2011   Postablative hypothyroidism 06/09/2011   Psoriatic arthritis (HCC) 06/09/2011   BPH with obstruction/lower urinary tract symptoms 06/09/2011    PCP: Amon Aloysius BRAVO, MD   REFERRING PROVIDER: Amon Aloysius BRAVO, MD   REFERRING DIAG: M54.9 (ICD-10-CM) - Acute back pain, unspecified back location, unspecified back pain laterality   Rationale for Evaluation and Treatment: Rehabilitation  THERAPY DIAG:  Other low back pain  Muscle  spasm of back  Muscle weakness (generalized)  Abnormal posture  Unsteadiness on feet  Difficulty in walking, not elsewhere classified  ONSET DATE: ~ Oct 2024  NEXT MD VISIT: 08/01/2024   SUBJECTIVE:                                                                                                                                                                                           SUBJECTIVE STATEMENT: States his back is feeling better with less radiation around the R side.     EVAL: Pt reports pain across his back on both sides beneath ribs. He has his gallbladder removed (Oct 2024) and he thought that was the cause of his pain but he is unsure. The pain came on after that surgery that he can recall. He says that he thinks he woke up one morning with his pain. Difficulty w/ picking up things and getting in and out of car   PERTINENT HISTORY:  Atrial fibrillation ablation 12/24/22; SSS s/p pacemaker 12/13/19; B knee scope; L foot surgery; umbilical and B inguinal hernia repairs; HTN; osteoporosis; OA; psoriatic arthritis; RA; cervical vertebral fracture; thyroid  disease - hypothyroidism; h/o Graves' disease; CKD; GERD; neuropathy; hearing loss; acute vertigo as of early June   PAIN:  Are you having pain? Yes: NPRS scale: 1/10; 2/10 in R foot/heel Pain location: across lower back, beneath level of ribs , sometimes to R abdominal area  Pain description: constant, sharp, breath taking Aggravating factors: bending forward, rolling over in bed Relieving factors: muscle relaxers taken recently   PRECAUTIONS: ICD/Pacemaker  RED FLAGS: None   WEIGHT BEARING RESTRICTIONS: No  FALLS:  Has patient fallen in last 6 months? No-pt has come close d/t pain and neuropathy   LIVING ENVIRONMENT: Lives with: lives with their spouse Lives in: House/apartment Stairs: Yes: Internal: 8 or 7 steps; on right going up and down and External: 1 steps; none Has following equipment at home: Quad cane  small base and Grab bars  OCCUPATION: Retired  PLOF: Independent and Leisure: walking with wife at [facility], formerly participated in [employer] at the [employer]   PATIENT GOALS: no pain in general, return to exercise/walking  w/o hurting, wants to get into archery   OBJECTIVE:  Note: Objective measures were completed at Evaluation unless otherwise noted.  DIAGNOSTIC FINDINGS:  EXAM: LUMBAR SPINE - COMPLETE 4+ VIEW; THORACIC SPINE 2 VIEW 06/14/24 FINDINGS: Right convex thoracolumbar curve.No acute fracture or evidence of traumatic listhesis in the thoracolumbar spine. Multilevel spondylosis with anterior osteophytes throughout the thoracic and lumbar spine. Intervertebral disc space height is maintained. Mild lower lumbar facet arthropathy.  IMPRESSION: 1. No acute fracture or traumatic listhesis in the thoracolumbar spine. 2. Mild multilevel spondylosis.  PATIENT SURVEYS:  Modified Oswestry: 38% Interpretation of scores: Score Category Description  0-20% Minimal Disability The patient can cope with most living activities. Usually no treatment is indicated apart from advice on lifting, sitting and exercise  21-40% Moderate Disability The patient experiences more pain and difficulty with sitting, lifting and standing. Travel and social life are more difficult and they may be disabled from work. Personal care, sexual activity and sleeping are not grossly affected, and the patient can usually be managed by conservative means  41-60% Severe Disability Pain remains the main problem in this group, but activities of daily living are affected. These patients require a detailed investigation  61-80% Crippled Back pain impinges on all aspects of the patient's life. Positive intervention is required  81-100% Bed-bound  These patients are either bed-bound or exaggerating their symptoms  Bluford FORBES Zoe DELENA Karon DELENA, et al. Surgery versus conservative management of stable  thoracolumbar fracture: the PRESTO feasibility RCT. [place] ([place]): [employer]; 2021 Nov. [facility] Technology Assessment, No. 25.62.) Appendix 3, Oswestry Disability Index category descriptors. Available from: FindJewelers.cz  Minimally Clinically Important Difference (MCID) = 12.8%  COGNITION: Overall cognitive status: Within functional limits for tasks assessed     SENSATION: WFL- pt has known peripheral neuropathy in his B feet, diminished sensation into legs some above the ankle    MUSCLE LENGTH: Hamstrings: Right mod tight; Left mod-severe tightness  Hip Flexors: R mod tight, L mod tight  Quads: R mod-severe, L mod severe Piriformis: L mod-severe tight, R mod tight  Hip IR: L mod-severe tight, R mod tight  POSTURE: rounded shoulders, forward head, increased lumbar lordosis, and increased thoracic kyphosis  PALPATION: TTP: R > L thoracolumbar paraspinals, tightness, QL (R>L tightness),   LUMBAR ROM:   AROM eval  Flexion WFL- bends knees to touch toes   Extension 25% limited   Right lateral flexion Mid calf   Left lateral flexion Mid calf  Right rotation Note p! On L (less than R)  Left rotation Limited p! On R side    (Blank rows = not tested)  LOWER EXTREMITY MMT:    MMT Right eval Left eval  Hip flexion 4+ 4+  Hip extension 3- 3-  Hip abduction 4- 4-  Hip adduction 4 3+  Hip internal rotation 4 4  Hip external rotation 4 4  Knee flexion 4+ 4+  Knee extension 5 5  Ankle dorsiflexion 4- 4-  Ankle plantarflexion    Ankle inversion    Ankle eversion     (Blank rows = not tested)  LUMBAR SPECIAL TESTS:  Not assessed   FUNCTIONAL TESTS:  Not assessed   GAIT: Distance walked: clinic distances  Assistive device utilized: None Level of assistance: Complete Independence Comments: pt ambulated into clinic w/o any devices. Displays rounded shoulder, some thoracic kyphosis   TREATMENT DATE:  08/02/24 THERAPEUTIC EXERCISE: To improve strength and endurance.  Demonstration, verbal and tactile cues throughout for technique. Bike L4 x 6'  THERAPEUTIC ACTIVITIES: To improve functional performance.  Demonstration, verbal and tactile cues throughout for technique. Seated hip flexor stretch x1' B Seated ham string stretch x 1' B Seated L SB stretch over bolster with RUE overhead reach x 30 sec x 3 Seated Green Swiss ball diagonal rollouts x 15 each way Standing trunk rotation blue TB x 15 L and R Standing thoracic rotation stretch with hands to wall  MANUAL THERAPY: To promote reduced pain utilizing therapeutic massage. STM manually and with theragun to R quadratus and T-spine paraspinals  07/31/24: THERAPEUTIC EXERCISE: To improve strength, endurance, ROM, and flexibility.  Demonstration, verbal and tactile cues throughout for technique. L sidelying overhead reach RUE for SB stretch Seated L SB w/ overhead reach RUE x 1' x 2 with some stretch both sides Thoracic rotation with hands on wall to R and to L with hip twist and end range for general jt mob x 20-30 each direction Standing thoracic rotation RTB 10x5 B S/L R QL stretch x 1'  MANUAL THERAPY: To promote reduced pain utilizing myofascial release. MFR to R thoracic/lumbar paraspinals, lats in L SL.    07/27/24: THERAPEUTIC EXERCISE: To improve strength, endurance, ROM, and flexibility.  Demonstration, verbal and tactile cues throughout for technique. NuStep-L5x46min   THERAPEUTIC ACTIVITIES: To improve functional performance.  Demonstration, verbal and tactile cues throughout for technique. L sidelying overhead reach RUE for SB stretch--no reproduction of pain Seated L SB w/ overhead reach RUE x 1' x 2 with some stretch Seated R SB overhead reach LUE --INCREASES PAIN the L thoracic spine Seated ham string stretch x 1' x 2  BLE  NEUROMUSCULAR RE-EDUCATION: To improve posture. Standing R abd wall slide/reach with L pelvic sideglide x 2/10 Thoracic rotation with hands on wall to R and to L with hip twist and end range for general jt mob x 20-30 each direction  MANUAL THERAPY: To promote reduced pain utilizing myofascial release. MFR to R thoracic paraspinals in L SL.   Supine manual PT assist SLR ham sttretch x 1' BLE  07/19/24 THERAPEUTIC EXERCISE: To improve strength, endurance, ROM, and flexibility.  Demonstration, verbal and tactile cues throughout for technique. NuStep-L5x25min  DKTC orange ball x 10 LTR on orange pball x 10 both ways Seated pball rollout NEUROMUSCULAR RE-EDUCATION: To improve coordination, kinesthesia, posture, and proprioception.  Standing hip abd x 20 BLE Standing hip ext x15 BLE Standing hip flexion x 20 BLE Seated pallof press GTB x 10 B Seated trunk rotation GTB x 10 B Supine single pedals BLE2x10 Bridges 2x10  PATIENT EDUCATION:  Education details: instructed on thoracic rotational and sidbending stretching  Person educated: Patient Education method: Programmer, multimedia, Demonstration, Verbal cues, and MedBridgeGO app access provided Education comprehension: verbalized understanding, returned demonstration, verbal cues required, and needs further education   HOME EXERCISE PROGRAM: Access Code: [email or web address] URL: [email or web address] Date: 07/27/2024 Prepared by: Garnette Montclair  Exercises - Seated 3 Way Exercise Ball Roll Out Stretch  - 1 x daily - 7 x weekly - 3 sets - 30 hold - Seated Hamstring Stretch (BKA)  - 1 x daily - 7 x weekly - 2 sets - 2 reps - 30 sec hold - Supine Posterior Pelvic Tilt  - 1 x daily - 7 x weekly - 2 sets - 10 reps - 5-10 sec hold - Hooklying Clamshell with Resistance  - 1 x daily -  7 x weekly - 2 sets - 10 reps - Supine March with Resistance Band  - 1 x daily - 7 x weekly - 2 sets - 10 reps - Sidebending to Elbow Short Sit  - 1 x daily -  7 x weekly - 1 sets - 2 reps - 1 min hold - Seated Quadratus Lumborum Stretch in Chair  - 1 x daily - 7 x weekly - 1 sets - 2 reps - 1 min hold - Standing Lumbar Rotation Stretch  - 1 x daily - 7 x weekly - 3 sets - 10 reps  ASSESSMENT:  CLINICAL IMPRESSION:  Patient is progressing with Rsided thoracic pain.  He has improved rotational ROM and lumbar SB with less pain.  He is still feeling pain in the R quadratus and R mid thoracic areas, and requires further PT to address these issues.  Deferred joint mobs due to RA dx.    EVAL: Deontez Klinke is a 78 y/o M referred to PT for evaluation and treatment of back pain. Pt states that his back pain is localized across his mid to lower back & sometimes seems to wrap around to his R abdominal area. Pt reports that he recalls his pain coming on after he had his gallbladder removed last October but, he does not know for sure if that is the cause of it. Pt says that his pain is bothersome most with bending forward to pick up objects, rolling over in bed, and getting in and out of his car at times. Today's assessment findings include deficits in lumbar ROM, B LE strength, and LE flexibility (as shown in charts above). Pt reported inc'd pain with rotational movements of the lumbar spine. Noted mild tenderness of the pt's R thorocalumbar paraspinals > L. Pt stated that although he has not fully fallen, he often feels unsteady on his feet d/t his B peripheral neuropathy.  Dave scored a 38% on the Modified Oswestry Low Back Pain Disability Questionnaire which represents moderate level of disability in activities such as personal care, sleeping, traveling, and homemaking. Montgomery Dora) will benefit from skilled PT intervention to address above deficits to improve mobility, function, and activity tolerance w/ dec'd pain interference.   OBJECTIVE IMPAIRMENTS: decreased activity tolerance, decreased balance, decreased endurance, decreased knowledge of condition,  decreased mobility, difficulty walking, decreased ROM, decreased strength, increased fascial restrictions, impaired perceived functional ability, increased muscle spasms, impaired flexibility, impaired sensation, improper body mechanics, postural dysfunction, and pain.   ACTIVITY LIMITATIONS: carrying, lifting, bending, sitting, standing, squatting, sleeping, stairs, transfers, bed mobility, bathing, dressing, and caring for others  PARTICIPATION LIMITATIONS: cleaning, laundry, driving, shopping, and community activity  PERSONAL FACTORS: Age, Fitness, Time since onset of injury/illness/exacerbation, and 3+ comorbidities: HTN; osteoporosis; OA; psoriatic arthritis; RA; cervical vertebral fracture; thyroid  disease - hypothyroidism; h/o Graves' disease; CKD; GERD; neuropathy are also affecting patient's functional outcome.   REHAB POTENTIAL: Good  CLINICAL DECISION MAKING: Evolving/moderate complexity  EVALUATION COMPLEXITY: Moderate   GOALS: Goals reviewed with patient? Yes  SHORT TERM GOALS: Target date: 08/01/2024    Patient will be independent with initial HEP.  Baseline: Goal status: MET_ 07/31/24  2.  Patient will report 25% improvement in pain to improve activity tolerance.  Baseline: best: 3/10, worst:8-9/10 Goal status: 07/19/24- MET  3.  Patient will perform FGA to establish current risk of fall.  Baseline: To be assessed  Goal status: MET- 07/11/2024   LONG TERM GOALS: Target date: 08/29/2024    Patient will be  independent with advanced HEP  Baseline: 07/27/24:  added SB and rotational strretching Goal status: IN PROGRESS  2.  Patient will increase his score on the Mod Oswestry LBP Disability Questionnaire by at least 12% for improved in functional ability.  Baseline: 38%  8/27:  22 / 50 = 44.0 % Goal status: INITIAL  3.  Patient will report 50% improvement in back pain to improve patient's activity tolerance and participation in leisure activities (walking,  archery). Baseline: 8-9/10 worst 07/27/24:  6/10 worst over last week Goal status: IN PROGRESS  4.  Patient will be able to ascend and descend at least 8 steps to ensure patient safety to negotiate stair at home.  Baseline:  Goal status: INITIAL  5.  Patient will be able to score at least a 28 on the FGA for safe community ambulation.  Baseline: 07/11/24- 24/30 Goal status: REVISED   6.  Patient will increase B LE strength grossly to 4+/5 to increase patient function and activity tolerance.  Baseline:  Goal status: INITIAL  PLAN:  PT FREQUENCY: 2x/week  PT DURATION: 8 weeks  PLANNED INTERVENTIONS: 97164- PT Re-evaluation, 97750- Physical Performance Testing, 97110-Therapeutic exercises, 97530- Therapeutic activity, 97112- Neuromuscular re-education, 97535- Self Care, [id number]- Manual therapy, [phone]- Gait training, [phone]- Aquatic Therapy, [phone]- Electrical stimulation (unattended), [id number]- Ultrasound, [id number]- Traction (mechanical), [id number]- Ionotophoresis 4mg /ml Dexamethasone , 79439 (1-2 muscles), [id number] (3+ muscles)- Dry Needling, Patient/Family education, Balance training, Stair training, Taping, Joint mobilization, Spinal mobilization, Cryotherapy, and Moist heat.  PLAN FOR NEXT SESSION:  Continue with rotational dynamic strengthening, stretching for quadratus;  Lat Pulldowns; modified side plank?  Wilmont Olund, PT 08/02/2024, 12:39 PM

## 2024-08-02 NOTE — Assessment & Plan Note (Signed)
 Hyperglycemia, check A1c HTN: Reports BPs are typically okay, on losartan  50 mg twice daily and amlodipine  5 mg daily.  Reports he frequently skips amlodipine  if BP is low, like today, 108/78.  Advised to take amlodipine  daily, offered to decrease dose to 2.5 mg, strongly declined. Hypothyroidism: On Synthroid , check TSH GERD: Still symptomatic, plans to discuss with GI Psoriatic arthritis: Reports she is on Humira, to see rheumatologist soon. Paroxysmal atrial fibrillation, sick sinus syndrome, pacemaker, anticoagulated: Essentially asymptomatic, no evidence of bleeding. CKD 3B: LOV renal 07/26/2024.  Next visit 6 months.  Creatinine 1.6. Neuropathy: Long h/o neuropathy, had a NCS remotely , he is frustrated about the lack of improvement.  Advised patient that this is a very difficult condition to treat and to my knowledge there is no cure unless specific causes to found. Encouraged to continue seeing neurology, perhaps another NCS or other studies are appropriate.  Excerpts from the last neurology report: Etiology of polyneuropathy not determined.    He did not tolerate gabapentin  or Lyrica  well.    Lamotrigine  had not helped.  As he has BPH so will avoid duloxetine. Trial of alpha lipoic acid 600 mg bid.   If not better after a month consider lacosamide 50 or 100 mg po bid or oxcarbazepine 300 mg 2-3 times a day Will check a folic acid  noting that the B12 deficiency is treated  Osteoporosis: DEXA pending, to be done in few days, on vitamin D . Right chest wall pain: Chronic since he had a gallbladder surgery, essentially no GI symptoms.  Plan --Check a chest x-ray. Vaccine advice provided RTC 4 months

## 2024-08-03 ENCOUNTER — Encounter: Payer: Self-pay | Admitting: Gastroenterology

## 2024-08-03 ENCOUNTER — Ambulatory Visit (HOSPITAL_BASED_OUTPATIENT_CLINIC_OR_DEPARTMENT_OTHER)
Admission: RE | Admit: 2024-08-03 | Discharge: 2024-08-03 | Disposition: A | Discharge status: Home / Self Care | Source: Ambulatory Visit | Attending: Internal Medicine | Admitting: Internal Medicine

## 2024-08-03 DIAGNOSIS — M810 Age-related osteoporosis without current pathological fracture: Secondary | ICD-10-CM | POA: Insufficient documentation

## 2024-08-03 DIAGNOSIS — M85851 Other specified disorders of bone density and structure, right thigh: Secondary | ICD-10-CM | POA: Diagnosis not present

## 2024-08-03 DIAGNOSIS — M85852 Other specified disorders of bone density and structure, left thigh: Secondary | ICD-10-CM | POA: Diagnosis not present

## 2024-08-04 ENCOUNTER — Ambulatory Visit: Payer: Self-pay | Admitting: Internal Medicine

## 2024-08-06 DIAGNOSIS — R490 Dysphonia: Secondary | ICD-10-CM | POA: Diagnosis not present

## 2024-08-06 DIAGNOSIS — G629 Polyneuropathy, unspecified: Secondary | ICD-10-CM | POA: Diagnosis not present

## 2024-08-06 DIAGNOSIS — E785 Hyperlipidemia, unspecified: Secondary | ICD-10-CM | POA: Diagnosis not present

## 2024-08-06 DIAGNOSIS — K219 Gastro-esophageal reflux disease without esophagitis: Secondary | ICD-10-CM | POA: Diagnosis not present

## 2024-08-06 DIAGNOSIS — R2689 Other abnormalities of gait and mobility: Secondary | ICD-10-CM | POA: Diagnosis not present

## 2024-08-06 DIAGNOSIS — H9313 Tinnitus, bilateral: Secondary | ICD-10-CM | POA: Diagnosis not present

## 2024-08-06 DIAGNOSIS — E039 Hypothyroidism, unspecified: Secondary | ICD-10-CM | POA: Diagnosis not present

## 2024-08-06 DIAGNOSIS — L4050 Arthropathic psoriasis, unspecified: Secondary | ICD-10-CM | POA: Diagnosis not present

## 2024-08-08 ENCOUNTER — Other Ambulatory Visit: Payer: Self-pay

## 2024-08-08 MED ORDER — FAMOTIDINE 40 MG PO TABS: 40.0000 mg | ORAL_TABLET | Freq: Every day | ORAL | 2 refills | Status: AC

## 2024-08-10 ENCOUNTER — Ambulatory Visit: Attending: Internal Medicine

## 2024-08-10 DIAGNOSIS — R2681 Unsteadiness on feet: Secondary | ICD-10-CM | POA: Diagnosis not present

## 2024-08-10 DIAGNOSIS — M62830 Muscle spasm of back: Secondary | ICD-10-CM | POA: Diagnosis not present

## 2024-08-10 DIAGNOSIS — M6281 Muscle weakness (generalized): Secondary | ICD-10-CM | POA: Diagnosis not present

## 2024-08-10 DIAGNOSIS — M5459 Other low back pain: Secondary | ICD-10-CM | POA: Diagnosis not present

## 2024-08-10 DIAGNOSIS — R262 Difficulty in walking, not elsewhere classified: Secondary | ICD-10-CM | POA: Diagnosis not present

## 2024-08-10 DIAGNOSIS — R293 Abnormal posture: Secondary | ICD-10-CM | POA: Insufficient documentation

## 2024-08-10 NOTE — Therapy (Signed)
 OUTPATIENT PHYSICAL THERAPY THORACOLUMBAR TREATMENT   Patient Name: Martin Rubio MRN: [id number] DOB:[birth year], 78 y.o., male Today's Date: 08/10/2024  END OF SESSION:  PT End of Session - 08/10/24 1539     Visit Number 9    Date for PT Re-Evaluation 08/29/24    Authorization Type Medicare & [place]  Life    PT Start Time 1448    PT Stop Time 1533    PT Time Calculation (min) 45 min    Activity Tolerance Patient tolerated treatment well    Behavior During Therapy [facility] for tasks assessed/performed              Past Medical History:  Diagnosis Date   Allergy    Bronchitis    Cataract    Chronic kidney disease    Diverticulosis    FH: BPH (benign prostatic hypertrophy)    GERD (gastroesophageal reflux disease)    History of Graves' disease 01/19/2018   S/p ablation   HTN (hypertension)    Hyperlipidemia    Neuromuscular disorder (HCC)    Obesity    Psoriatic arthritis (HCC)    Rheumatoid arthritis (HCC) 08/07/2014   Sleep apnea    CPAP   Thyroid  disease    Past Surgical History:  Procedure Laterality Date   ABDOMINAL AORTOGRAM W/LOWER EXTREMITY N/A 01/14/2022   Procedure: ABDOMINAL AORTOGRAM W/LOWER EXTREMITY;  Surgeon: Darron Deatrice LABOR, MD;  Location: [facility];  Service: Cardiovascular;  Laterality: N/A;   ATRIAL FIBRILLATION ABLATION N/A 12/24/2022   Procedure: ATRIAL FIBRILLATION ABLATION;  Surgeon: Nancey Eulas BRAVO, MD;  Location: [facility];  Service: Cardiovascular;  Laterality: N/A;   CHOLECYSTECTOMY N/A 09/18/2023   Procedure: LAPAROSCOPIC CHOLECYSTECTOMY WITH INTRAOPERATIVE CHOLANGIOGRAM;  Surgeon: Kinsinger, Herlene Righter, MD;  Location: [facility] OR;  Service: General;  Laterality: N/A;   INGUINAL HERNIA REPAIR     bilateral   KNEE SURGERY     bilateral arthroscopic   l foot surgery Left 12/2020   PACEMAKER IMPLANT N/A 12/13/2019   Procedure: PACEMAKER IMPLANT;  Surgeon: Waddell Danelle ORN, MD;  Location: [facility];  Service:  Cardiovascular;  Laterality: N/A;   TRANSURETHRAL RESECTION OF PROSTATE     UMBILICAL HERNIA REPAIR     Patient Active Problem List   Diagnosis Date Noted   Chronic diastolic heart failure (HCC) 08/01/2024   B12 deficiency 04/05/2024   Obesity (BMI 30-39.9) 09/17/2023   Benign paroxysmal positional vertigo 06/11/2023   Aortic atherosclerosis (HCC) 03/18/2021   Bruit 02/09/2021   Aortic valve sclerosis 02/09/2021   PCP notes >>>>>>>>>>>>>>> 01/10/2020   Age related osteoporosis 01/10/2020   CKD stage 3b, GFR 30-44 ml/min (HCC) 12/16/2019   Pacemaker 12/16/2019   Sick sinus syndrome (HCC) 12/16/2019   Paroxysmal atrial fibrillation (HCC) 10/26/2019   Nodule of flexor tendon sheath 04/13/2019   Low bone mass 01/19/2018   Iliac aneurysm (HCC) 06/16/2017   Hypertrophic cardiomyopathy (HCC) 06/16/2017   Essential hypertension 12/16/2016   Hyperlipidemia LDL goal <100 12/16/2016   Asthma in adult, mild intermittent, uncomplicated 10/12/2016   Collapsed vertebra, not elsewhere classified, cervical region, initial encounter for fracture (HCC) 10/08/2016   Annual physical exam 10/08/2016   Neuropathy 10/08/2016   Contracture of ankle and foot joint 08/07/2014   Esophageal reflux 08/07/2014   Organic impotence 08/07/2014   Obstructive sleep apnea 08/07/2014   Osteoarthritis 08/07/2014   Sensorineural hearing loss 08/07/2014   Tinnitus 08/07/2014   LVH (left ventricular hypertrophy) 12/25/2012   Diverticulosis 09/01/2011  Hypothyroidism 06/09/2011   Postablative hypothyroidism 06/09/2011   Psoriatic arthritis (HCC) 06/09/2011   BPH with obstruction/lower urinary tract symptoms 06/09/2011    PCP: Amon Aloysius BRAVO, MD   REFERRING PROVIDER: Amon Aloysius BRAVO, MD   REFERRING DIAG: M54.9 (ICD-10-CM) - Acute back pain, unspecified back location, unspecified back pain laterality   Rationale for Evaluation and Treatment: Rehabilitation  THERAPY DIAG:  Other low back pain  Muscle spasm of  back  Muscle weakness (generalized)  Abnormal posture  Unsteadiness on feet  Difficulty in walking, not elsewhere classified  ONSET DATE: ~ Oct 2024  NEXT MD VISIT: 08/01/2024   SUBJECTIVE:                                                                                                                                                                                           SUBJECTIVE STATEMENT: Back feels better, asking about x-ray results 2 months ago  EVAL: Pt reports pain across his back on both sides beneath ribs. He has his gallbladder removed (Oct 2024) and he thought that was the cause of his pain but he is unsure. The pain came on after that surgery that he can recall. He says that he thinks he woke up one morning with his pain. Difficulty w/ picking up things and getting in and out of car   PERTINENT HISTORY:  Atrial fibrillation ablation 12/24/22; SSS s/p pacemaker 12/13/19; B knee scope; L foot surgery; umbilical and B inguinal hernia repairs; HTN; osteoporosis; OA; psoriatic arthritis; RA; cervical vertebral fracture; thyroid  disease - hypothyroidism; h/o Graves' disease; CKD; GERD; neuropathy; hearing loss; acute vertigo as of early June   PAIN:  Are you having pain? Yes: NPRS scale: 1/10; 2/10 in R foot/heel Pain location: across lower back, beneath level of ribs , sometimes to R abdominal area  Pain description: constant, sharp, breath taking Aggravating factors: bending forward, rolling over in bed Relieving factors: muscle relaxers taken recently   PRECAUTIONS: ICD/Pacemaker  RED FLAGS: None   WEIGHT BEARING RESTRICTIONS: No  FALLS:  Has patient fallen in last 6 months? No-pt has come close d/t pain and neuropathy   LIVING ENVIRONMENT: Lives with: lives with their spouse Lives in: House/apartment Stairs: Yes: Internal: 8 or 7 steps; on right going up and down and External: 1 steps; none Has following equipment at home: Quad cane small base and Grab  bars  OCCUPATION: Retired  PLOF: Independent and Leisure: walking with wife at [facility], formerly participated in [employer] at the [employer]   PATIENT GOALS: no pain in general, return to exercise/walking w/o hurting, wants to get into  archery   OBJECTIVE:  Note: Objective measures were completed at Evaluation unless otherwise noted.  DIAGNOSTIC FINDINGS:  EXAM: LUMBAR SPINE - COMPLETE 4+ VIEW; THORACIC SPINE 2 VIEW 06/14/24 FINDINGS: Right convex thoracolumbar curve.No acute fracture or evidence of traumatic listhesis in the thoracolumbar spine. Multilevel spondylosis with anterior osteophytes throughout the thoracic and lumbar spine. Intervertebral disc space height is maintained. Mild lower lumbar facet arthropathy.  IMPRESSION: 1. No acute fracture or traumatic listhesis in the thoracolumbar spine. 2. Mild multilevel spondylosis.  PATIENT SURVEYS:  Modified Oswestry: 38% Interpretation of scores: Score Category Description  0-20% Minimal Disability The patient can cope with most living activities. Usually no treatment is indicated apart from advice on lifting, sitting and exercise  21-40% Moderate Disability The patient experiences more pain and difficulty with sitting, lifting and standing. Travel and social life are more difficult and they may be disabled from work. Personal care, sexual activity and sleeping are not grossly affected, and the patient can usually be managed by conservative means  41-60% Severe Disability Pain remains the main problem in this group, but activities of daily living are affected. These patients require a detailed investigation  61-80% Crippled Back pain impinges on all aspects of the patient's life. Positive intervention is required  81-100% Bed-bound  These patients are either bed-bound or exaggerating their symptoms  Bluford FORBES Zoe DELENA Karon DELENA, et al. Surgery versus conservative management of stable thoracolumbar  fracture: the PRESTO feasibility RCT. [place]): [employer]; 2021 Nov. [facility] Technology Assessment, No. 25.62.) Appendix 3, Oswestry Disability Index category descriptors. Available from: FindJewelers.cz  Minimally Clinically Important Difference (MCID) = 12.8%  COGNITION: Overall cognitive status: Within functional limits for tasks assessed     SENSATION: WFL- pt has known peripheral neuropathy in his B feet, diminished sensation into legs some above the ankle    MUSCLE LENGTH: Hamstrings: Right mod tight; Left mod-severe tightness  Hip Flexors: R mod tight, L mod tight  Quads: R mod-severe, L mod severe Piriformis: L mod-severe tight, R mod tight  Hip IR: L mod-severe tight, R mod tight  POSTURE: rounded shoulders, forward head, increased lumbar lordosis, and increased thoracic kyphosis  PALPATION: TTP: R > L thoracolumbar paraspinals, tightness, QL (R>L tightness),   LUMBAR ROM:   AROM eval  Flexion WFL- bends knees to touch toes   Extension 25% limited   Right lateral flexion Mid calf   Left lateral flexion Mid calf  Right rotation Note p! On L (less than R)  Left rotation Limited p! On R side    (Blank rows = not tested)  LOWER EXTREMITY MMT:    MMT Right eval Left eval  Hip flexion 4+ 4+  Hip extension 3- 3-  Hip abduction 4- 4-  Hip adduction 4 3+  Hip internal rotation 4 4  Hip external rotation 4 4  Knee flexion 4+ 4+  Knee extension 5 5  Ankle dorsiflexion 4- 4-  Ankle plantarflexion    Ankle inversion    Ankle eversion     (Blank rows = not tested)  LUMBAR SPECIAL TESTS:  Not assessed   FUNCTIONAL TESTS:  Not assessed   GAIT: Distance walked: clinic distances  Assistive device utilized: None Level of assistance: Complete Independence Comments: pt ambulated into clinic w/o any devices. Displays rounded shoulder, some thoracic kyphosis   TREATMENT DATE:  08/10/24 THERAPEUTIC EXERCISE: To improve strength and endurance.  Demonstration, verbal and tactile cues throughout for technique. Nustep L5x6min Seated flexion green pball 2x1 min  NEUROMUSCULAR RE-EDUCATION: To improve posture. Seated on green pball:  Ab sets 10x5  Marching x 5 B- very difficult  Rows GTB x10 Standing shoulder extension 3x12 20lb  BLE Seated rows 35lb 3x12 BLE Leg curls 25lb x 12  08/02/24 THERAPEUTIC EXERCISE: To improve strength and endurance.  Demonstration, verbal and tactile cues throughout for technique. Bike L4 x 6'  THERAPEUTIC ACTIVITIES: To improve functional performance.  Demonstration, verbal and tactile cues throughout for technique. Seated hip flexor stretch x1' B Seated ham string stretch x 1' B Seated L SB stretch over bolster with RUE overhead reach x 30 sec x 3 Seated Green Swiss ball diagonal rollouts x 15 each way Standing trunk rotation blue TB x 15 L and R Standing thoracic rotation stretch with hands to wall  MANUAL THERAPY: To promote reduced pain utilizing therapeutic massage. STM manually and with theragun to R quadratus and T-spine paraspinals  07/31/24: THERAPEUTIC EXERCISE: To improve strength, endurance, ROM, and flexibility.  Demonstration, verbal and tactile cues throughout for technique. L sidelying overhead reach RUE for SB stretch Seated L SB w/ overhead reach RUE x 1' x 2 with some stretch both sides Thoracic rotation with hands on wall to R and to L with hip twist and end range for general jt mob x 20-30 each direction Standing thoracic rotation RTB 10x5 B S/L R QL stretch x 1'  MANUAL THERAPY: To promote reduced pain utilizing myofascial release. MFR to R thoracic/lumbar paraspinals, lats in L SL.    07/27/24: THERAPEUTIC EXERCISE: To improve strength, endurance, ROM, and flexibility.  Demonstration, verbal and tactile cues throughout for  technique. NuStep-L5x7min   THERAPEUTIC ACTIVITIES: To improve functional performance.  Demonstration, verbal and tactile cues throughout for technique. L sidelying overhead reach RUE for SB stretch--no reproduction of pain Seated L SB w/ overhead reach RUE x 1' x 2 with some stretch Seated R SB overhead reach LUE --INCREASES PAIN the L thoracic spine Seated ham string stretch x 1' x 2 BLE  NEUROMUSCULAR RE-EDUCATION: To improve posture. Standing R abd wall slide/reach with L pelvic sideglide x 2/10 Thoracic rotation with hands on wall to R and to L with hip twist and end range for general jt mob x 20-30 each direction  MANUAL THERAPY: To promote reduced pain utilizing myofascial release. MFR to R thoracic paraspinals in L SL.   Supine manual PT assist SLR ham sttretch x 1' BLE  07/19/24 THERAPEUTIC EXERCISE: To improve strength, endurance, ROM, and flexibility.  Demonstration, verbal and tactile cues throughout for technique. NuStep-L5x47min  DKTC orange ball x 10 LTR on orange pball x 10 both ways Seated pball rollout NEUROMUSCULAR RE-EDUCATION: To improve coordination, kinesthesia, posture, and proprioception.  Standing hip abd x 20 BLE Standing hip ext x15 BLE Standing hip flexion x 20 BLE Seated pallof press GTB x 10 B Seated trunk rotation GTB x 10 B Supine single pedals BLE2x10 Bridges 2x10  PATIENT EDUCATION:  Education details: instructed on thoracic rotational and sidbending stretching  Person educated: Patient Education method: Programmer, multimedia, Demonstration, Verbal cues, and MedBridgeGO app access provided Education comprehension: verbalized understanding, returned demonstration, verbal cues required, and needs further education   HOME EXERCISE PROGRAM: Access Code: [email or web address] URL: [email or web address] Date: 07/27/2024 Prepared by: Garnette Montclair  Exercises - Seated 3 Way Exercise [employer] Out Stretch  - 1 x daily -  7 x weekly - 3 sets - 30 hold -  Seated Hamstring Stretch (BKA)  - 1 x daily - 7 x weekly - 2 sets - 2 reps - 30 sec hold - Supine Posterior Pelvic Tilt  - 1 x daily - 7 x weekly - 2 sets - 10 reps - 5-10 sec hold - Hooklying Clamshell with Resistance  - 1 x daily - 7 x weekly - 2 sets - 10 reps - Supine March with Resistance Band  - 1 x daily - 7 x weekly - 2 sets - 10 reps - Sidebending to Elbow Short Sit  - 1 x daily - 7 x weekly - 1 sets - 2 reps - 1 min hold - Seated Quadratus Lumborum Stretch in Chair  - 1 x daily - 7 x weekly - 1 sets - 2 reps - 1 min hold - Standing Lumbar Rotation Stretch  - 1 x daily - 7 x weekly - 3 sets - 10 reps  ASSESSMENT:  CLINICAL IMPRESSION:  Progressed with postural strengthening and core stabilization exercises. Pt showed most difficulty with seated marching on pball, he wanted to perform at home but advised him not to d/t unsteadiness. Cues required throughout session to correct form. He is progressing with goals, able to climb a full flight of stairs w/o issue.    EVAL: Briton Sellman is a 78 y/o M referred to PT for evaluation and treatment of back pain. Pt states that his back pain is localized across his mid to lower back & sometimes seems to wrap around to his R abdominal area. Pt reports that he recalls his pain coming on after he had his gallbladder removed last October but, he does not know for sure if that is the cause of it. Pt says that his pain is bothersome most with bending forward to pick up objects, rolling over in bed, and getting in and out of his car at times. Today's assessment findings include deficits in lumbar ROM, B LE strength, and LE flexibility (as shown in charts above). Pt reported inc'd pain with rotational movements of the lumbar spine. Noted mild tenderness of the pt's R thorocalumbar paraspinals > L. Pt stated that although he has not fully fallen, he often feels unsteady on his feet d/t his B peripheral neuropathy.  Dave scored a 38% on the Modified Oswestry  Low Back Pain Disability Questionnaire which represents moderate level of disability in activities such as personal care, sleeping, traveling, and homemaking. Montgomery Dora) will benefit from skilled PT intervention to address above deficits to improve mobility, function, and activity tolerance w/ dec'd pain interference.   OBJECTIVE IMPAIRMENTS: decreased activity tolerance, decreased balance, decreased endurance, decreased knowledge of condition, decreased mobility, difficulty walking, decreased ROM, decreased strength, increased fascial restrictions, impaired perceived functional ability, increased muscle spasms, impaired flexibility, impaired sensation, improper body mechanics, postural dysfunction, and pain.   ACTIVITY LIMITATIONS: carrying, lifting, bending, sitting, standing, squatting, sleeping, stairs, transfers, bed mobility, bathing, dressing, and caring for others  PARTICIPATION LIMITATIONS: cleaning, laundry, driving, shopping, and community activity  PERSONAL FACTORS: Age, Fitness, Time since onset of injury/illness/exacerbation, and 3+ comorbidities: HTN; osteoporosis; OA; psoriatic arthritis; RA; cervical vertebral fracture; thyroid  disease - hypothyroidism; h/o Graves' disease; CKD; GERD; neuropathy are also affecting patient's functional outcome.   REHAB POTENTIAL: Good  CLINICAL DECISION MAKING: Evolving/moderate complexity  EVALUATION COMPLEXITY: Moderate   GOALS: Goals reviewed with patient? Yes  SHORT TERM GOALS: Target date: 08/01/2024    Patient will be  independent with initial HEP.  Baseline: Goal status: MET_ 07/31/24  2.  Patient will report 25% improvement in pain to improve activity tolerance.  Baseline: best: 3/10, worst:8-9/10 Goal status: 07/19/24- MET  3.  Patient will perform FGA to establish current risk of fall.  Baseline: To be assessed  Goal status: MET- 07/11/2024   LONG TERM GOALS: Target date: 08/29/2024    Patient will be independent with  advanced HEP  Baseline: 07/27/24:  added SB and rotational strretching Goal status: IN PROGRESS  2.  Patient will increase his score on the Mod Oswestry LBP Disability Questionnaire by at least 12% for improved in functional ability.  Baseline: 38%  8/27:  22 / 50 = 44.0 % Goal status: INITIAL  3.  Patient will report 50% improvement in back pain to improve patient's activity tolerance and participation in leisure activities (walking, archery). Baseline: 8-9/10 worst 07/27/24:  6/10 worst over last week Goal status: IN PROGRESS  4.  Patient will be able to ascend and descend at least 8 steps to ensure patient safety to negotiate stair at home.  Baseline:  Goal status: MET- 08/10/24  5.  Patient will be able to score at least a 28 on the FGA for safe community ambulation.  Baseline: 07/11/24- 24/30 Goal status: REVISED   6.  Patient will increase B LE strength grossly to 4+/5 to increase patient function and activity tolerance.  Baseline:  Goal status: INITIAL  PLAN:  PT FREQUENCY: 2x/week  PT DURATION: 8 weeks  PLANNED INTERVENTIONS: 97164- PT Re-evaluation, 97750- Physical Performance Testing, 97110-Therapeutic exercises, 97530- Therapeutic activity, 97112- Neuromuscular re-education, 97535- Self Care, 02859- Manual therapy, [phone]- Gait training, [phone]- Aquatic Therapy, [phone]- Electrical stimulation (unattended), [id number]- Ultrasound, [id number]- Traction (mechanical), [id number]- Ionotophoresis 4mg /ml Dexamethasone , 79439 (1-2 muscles), 20561 (3+ muscles)- Dry Needling, Patient/Family education, Balance training, Stair training, Taping, Joint mobilization, Spinal mobilization, Cryotherapy, and Moist heat.  PLAN FOR NEXT SESSION:  Lat Pulldowns; modified side plank?  Runa Whittingham L Sahirah Rudell, PTA 08/10/2024, 3:40 PM

## 2024-08-11 ENCOUNTER — Other Ambulatory Visit: Payer: Self-pay | Admitting: Internal Medicine

## 2024-08-14 ENCOUNTER — Other Ambulatory Visit (HOSPITAL_COMMUNITY): Payer: Self-pay

## 2024-08-14 ENCOUNTER — Telehealth: Payer: Self-pay

## 2024-08-14 NOTE — Telephone Encounter (Signed)
 Pharmacy Patient Advocate Encounter   Received notification from CoverMyMeds that prior authorization for hydrOXYzine  Pamoate 25MG  capsules  is required/requested.   Insurance verification completed.   The patient is insured through [employer] .   Per test claim: PA required; PA submitted to above mentioned insurance via Latent Key/confirmation #/EOC [employer] Status is pending  PLEASE BE ADVISED I CALLED PHARMACY PT HAS BEEN USING A DISCOUNT CARD TO GET THIS DUE TO DRUG INTERACTION WITH OXYBUTYNIN  XI.   PA IS REQUIRED PER PLAN BECAUSE OF DRUG INTERACTION. I WILL PROCEED WITH PA.

## 2024-08-15 ENCOUNTER — Ambulatory Visit

## 2024-08-15 ENCOUNTER — Ambulatory Visit (INDEPENDENT_AMBULATORY_CARE_PROVIDER_SITE_OTHER)

## 2024-08-15 ENCOUNTER — Other Ambulatory Visit (HOSPITAL_COMMUNITY): Payer: Self-pay

## 2024-08-15 DIAGNOSIS — R293 Abnormal posture: Secondary | ICD-10-CM | POA: Diagnosis not present

## 2024-08-15 DIAGNOSIS — M5459 Other low back pain: Secondary | ICD-10-CM | POA: Diagnosis not present

## 2024-08-15 DIAGNOSIS — M62830 Muscle spasm of back: Secondary | ICD-10-CM | POA: Diagnosis not present

## 2024-08-15 DIAGNOSIS — E538 Deficiency of other specified B group vitamins: Secondary | ICD-10-CM

## 2024-08-15 DIAGNOSIS — M6281 Muscle weakness (generalized): Secondary | ICD-10-CM | POA: Diagnosis not present

## 2024-08-15 DIAGNOSIS — R262 Difficulty in walking, not elsewhere classified: Secondary | ICD-10-CM

## 2024-08-15 DIAGNOSIS — R2681 Unsteadiness on feet: Secondary | ICD-10-CM | POA: Diagnosis not present

## 2024-08-15 MED ORDER — CYANOCOBALAMIN 1000 MCG/ML IJ SOLN
1000.0000 ug | Freq: Once | INTRAMUSCULAR | Status: AC
  Administered 2024-08-15: 1000 ug via INTRAMUSCULAR

## 2024-08-15 NOTE — Therapy (Signed)
 OUTPATIENT PHYSICAL THERAPY THORACOLUMBAR TREATMENT   Patient Name: Martin Rubio MRN: [id number] DOB:Feb 16, 1946, 78 y.o., male Today's Date: 08/15/2024  END OF SESSION:  PT End of Session - 08/15/24 0933     Visit Number 10    Date for PT Re-Evaluation 08/29/24    Authorization Type Medicare & [place]  Life    PT Start Time 0929    PT Stop Time 1014    PT Time Calculation (min) 45 min    Activity Tolerance Patient tolerated treatment well    Behavior During Therapy [facility] for tasks assessed/performed               Past Medical History:  Diagnosis Date   Allergy    Bronchitis    Cataract    Chronic kidney disease    Diverticulosis    FH: BPH (benign prostatic hypertrophy)    GERD (gastroesophageal reflux disease)    History of Graves' disease 01/19/2018   S/p ablation   HTN (hypertension)    Hyperlipidemia    Neuromuscular disorder (HCC)    Obesity    Psoriatic arthritis (HCC)    Rheumatoid arthritis (HCC) 08/07/2014   Sleep apnea    CPAP   Thyroid  disease    Past Surgical History:  Procedure Laterality Date   ABDOMINAL AORTOGRAM W/LOWER EXTREMITY N/A 01/14/2022   Procedure: ABDOMINAL AORTOGRAM W/LOWER EXTREMITY;  Surgeon: Darron Deatrice LABOR, MD;  Location: [facility] IN[facility];  Service: Cardiovascular;  Laterality: N/A;   ATRIAL FIBRILLATION ABLATION N/A 12/24/2022   Procedure: ATRIAL FIBRILLATION ABLATION;  Surgeon: Nancey Eulas BRAVO, MD;  Location: [facility] IN[facility];  Service: Cardiovascular;  Laterality: N/A;   CHOLECYSTECTOMY N/A 09/18/2023   Procedure: LAPAROSCOPIC CHOLECYSTECTOMY WITH INTRAOPERATIVE CHOLANGIOGRAM;  Surgeon: Kinsinger, Herlene Righter, MD;  Location: [facility] OR;  Service: General;  Laterality: N/A;   INGUINAL HERNIA REPAIR     bilateral   KNEE SURGERY     bilateral arthroscopic   l foot surgery Left 12/2020   PACEMAKER IMPLANT N/A 12/13/2019   Procedure: PACEMAKER IMPLANT;  Surgeon: Waddell Danelle ORN, MD;  Location: [facility];  Service:  Cardiovascular;  Laterality: N/A;   TRANSURETHRAL RESECTION OF PROSTATE     UMBILICAL HERNIA REPAIR     Patient Active Problem List   Diagnosis Date Noted   Chronic diastolic heart failure (HCC) 08/01/2024   B12 deficiency 04/05/2024   Obesity (BMI 30-39.9) 09/17/2023   Benign paroxysmal positional vertigo 06/11/2023   Aortic atherosclerosis (HCC) 03/18/2021   Bruit 02/09/2021   Aortic valve sclerosis 02/09/2021   PCP notes >>>>>>>>>>>>>>> 01/10/2020   Age related osteoporosis 01/10/2020   CKD stage 3b, GFR 30-44 ml/min (HCC) 12/16/2019   Pacemaker 12/16/2019   Sick sinus syndrome (HCC) 12/16/2019   Paroxysmal atrial fibrillation (HCC) 10/26/2019   Nodule of flexor tendon sheath 04/13/2019   Low bone mass 01/19/2018   Iliac aneurysm (HCC) 06/16/2017   Hypertrophic cardiomyopathy (HCC) 06/16/2017   Essential hypertension 12/16/2016   Hyperlipidemia LDL goal <100 12/16/2016   Asthma in adult, mild intermittent, uncomplicated 10/12/2016   Collapsed vertebra, not elsewhere classified, cervical region, initial encounter for fracture (HCC) 10/08/2016   Annual physical exam 10/08/2016   Neuropathy 10/08/2016   Contracture of ankle and foot joint 08/07/2014   Esophageal reflux 08/07/2014   Organic impotence 08/07/2014   Obstructive sleep apnea 08/07/2014   Osteoarthritis 08/07/2014   Sensorineural hearing loss 08/07/2014   Tinnitus 08/07/2014   LVH (left ventricular hypertrophy) 12/25/2012   Diverticulosis 09/01/2011  Hypothyroidism 06/09/2011   Postablative hypothyroidism 06/09/2011   Psoriatic arthritis (HCC) 06/09/2011   BPH with obstruction/lower urinary tract symptoms 06/09/2011    PCP: Amon Aloysius BRAVO, MD   REFERRING PROVIDER: Amon Aloysius BRAVO, MD   REFERRING DIAG: M54.9 (ICD-10-CM) - Acute back pain, unspecified back location, unspecified back pain laterality   Rationale for Evaluation and Treatment: Rehabilitation  THERAPY DIAG:  Other low back pain  Muscle spasm of  back  Muscle weakness (generalized)  Abnormal posture  Unsteadiness on feet  Difficulty in walking, not elsewhere classified  ONSET DATE: ~ Oct 2024  NEXT MD VISIT: 08/01/2024   SUBJECTIVE:                                                                                                                                                                                           SUBJECTIVE STATEMENT: Back feels better  EVAL: Pt reports pain across his back on both sides beneath ribs. He has his gallbladder removed (Oct 2024) and he thought that was the cause of his pain but he is unsure. The pain came on after that surgery that he can recall. He says that he thinks he woke up one morning with his pain. Difficulty w/ picking up things and getting in and out of car   PERTINENT HISTORY:  Atrial fibrillation ablation 12/24/22; SSS s/p pacemaker 12/13/19; B knee scope; L foot surgery; umbilical and B inguinal hernia repairs; HTN; osteoporosis; OA; psoriatic arthritis; RA; cervical vertebral fracture; thyroid  disease - hypothyroidism; h/o Graves' disease; CKD; GERD; neuropathy; hearing loss; acute vertigo as of early June   PAIN:  Are you having pain? Yes: NPRS scale: 1/10; 2/10 in R foot/heel Pain location: across lower back, beneath level of ribs , sometimes to R abdominal area  Pain description: constant, sharp, breath taking Aggravating factors: bending forward, rolling over in bed Relieving factors: muscle relaxers taken recently   PRECAUTIONS: ICD/Pacemaker  RED FLAGS: None   WEIGHT BEARING RESTRICTIONS: No  FALLS:  Has patient fallen in last 6 months? No-pt has come close d/t pain and neuropathy   LIVING ENVIRONMENT: Lives with: lives with their spouse Lives in: House/apartment Stairs: Yes: Internal: 8 or 7 steps; on right going up and down and External: 1 steps; none Has following equipment at home: Quad cane small base and Grab bars  OCCUPATION: Retired  PLOF: Independent  and Leisure: walking with wife at [facility], formerly participated in [employer] at the [employer]   PATIENT GOALS: no pain in general, return to exercise/walking w/o hurting, wants to get into archery   OBJECTIVE:  Note: Objective  measures were completed at Evaluation unless otherwise noted.  DIAGNOSTIC FINDINGS:  EXAM: LUMBAR SPINE - COMPLETE 4+ VIEW; THORACIC SPINE 2 VIEW 06/14/24 FINDINGS: Right convex thoracolumbar curve.No acute fracture or evidence of traumatic listhesis in the thoracolumbar spine. Multilevel spondylosis with anterior osteophytes throughout the thoracic and lumbar spine. Intervertebral disc space height is maintained. Mild lower lumbar facet arthropathy.  IMPRESSION: 1. No acute fracture or traumatic listhesis in the thoracolumbar spine. 2. Mild multilevel spondylosis.  PATIENT SURVEYS:  Modified Oswestry: 38% Interpretation of scores: Score Category Description  0-20% Minimal Disability The patient can cope with most living activities. Usually no treatment is indicated apart from advice on lifting, sitting and exercise  21-40% Moderate Disability The patient experiences more pain and difficulty with sitting, lifting and standing. Travel and social life are more difficult and they may be disabled from work. Personal care, sexual activity and sleeping are not grossly affected, and the patient can usually be managed by conservative means  41-60% Severe Disability Pain remains the main problem in this group, but activities of daily living are affected. These patients require a detailed investigation  61-80% Crippled Back pain impinges on all aspects of the patient's life. Positive intervention is required  81-100% Bed-bound  These patients are either bed-bound or exaggerating their symptoms  Bluford FORBES Zoe DELENA Karon DELENA, et al. Surgery versus conservative management of stable thoracolumbar fracture: the PRESTO feasibility RCT. [place] ([place]):  [employer]; 2021 Nov. [facility] Technology Assessment, No. 25.62.) Appendix 3, Oswestry Disability Index category descriptors. Available from: FindJewelers.cz  Minimally Clinically Important Difference (MCID) = 12.8%  COGNITION: Overall cognitive status: Within functional limits for tasks assessed     SENSATION: WFL- pt has known peripheral neuropathy in his B feet, diminished sensation into legs some above the ankle    MUSCLE LENGTH: Hamstrings: Right mod tight; Left mod-severe tightness  Hip Flexors: R mod tight, L mod tight  Quads: R mod-severe, L mod severe Piriformis: L mod-severe tight, R mod tight  Hip IR: L mod-severe tight, R mod tight  POSTURE: rounded shoulders, forward head, increased lumbar lordosis, and increased thoracic kyphosis  PALPATION: TTP: R > L thoracolumbar paraspinals, tightness, QL (R>L tightness),   LUMBAR ROM:   AROM eval  Flexion WFL- bends knees to touch toes   Extension 25% limited   Right lateral flexion Mid calf   Left lateral flexion Mid calf  Right rotation Note p! On L (less than R)  Left rotation Limited p! On R side    (Blank rows = not tested)  LOWER EXTREMITY MMT:    MMT Right eval Left eval R 08/15/24 L 08/15/24  Hip flexion 4+ 4+    Hip extension 3- 3- 4 4  Hip abduction 4- 4- 4 4  Hip adduction 4 3+ 4- 4-  Hip internal rotation 4 4 4+ 4+- hip p!  Hip external rotation 4 4 4+ 4  Knee flexion 4+ 4+    Knee extension 5 5    Ankle dorsiflexion 4- 4- 4+ 4+  Ankle plantarflexion      Ankle inversion      Ankle eversion       (Blank rows = not tested)  LUMBAR SPECIAL TESTS:  Not assessed   FUNCTIONAL TESTS:  Not assessed   GAIT: Distance walked: clinic distances  Assistive device utilized: None Level of assistance: Complete Independence Comments: pt ambulated into clinic w/o any devices. Displays rounded shoulder, some thoracic kyphosis   TREATMENT DATE:  08/15/24 Nustep L5x8min Side plank x 10 B Seated on green pball:  Shoulder rows x 20 GTB  Pallof press x 20 GTB FGA and LE MMT completed  [facility] PT Assessment - 08/15/24 0001       Functional Gait  Assessment   Gait Level Surface Walks 20 ft in less than 7 sec but greater than 5.5 sec, uses assistive device, slower speed, mild gait deviations, or deviates 6-10 in outside of the 12 in walkway width.    Change in Gait Speed Able to smoothly change walking speed without loss of balance or gait deviation. Deviate no more than 6 in outside of the 12 in walkway width.    Gait with Horizontal Head Turns Performs head turns smoothly with slight change in gait velocity (eg, minor disruption to smooth gait path), deviates 6-10 in outside 12 in walkway width, or uses an assistive device.    Gait with Vertical Head Turns Performs head turns with no change in gait. Deviates no more than 6 in outside 12 in walkway width.    Gait and Pivot Turn Pivot turns safely within 3 sec and stops quickly with no loss of balance.    Step Over Obstacle Is able to step over 2 stacked shoe boxes taped together (9 in total height) without changing gait speed. No evidence of imbalance.    Gait with Narrow Base of Support Ambulates 7-9 steps.    Gait with Eyes Closed Walks 20 ft, uses assistive device, slower speed, mild gait deviations, deviates 6-10 in outside 12 in walkway width. Ambulates 20 ft in less than 9 sec but greater than 7 sec.    Ambulating Backwards Walks 20 ft, uses assistive device, slower speed, mild gait deviations, deviates 6-10 in outside 12 in walkway width.    Steps Alternating feet, must use rail.    Total Score 24          08/10/24 THERAPEUTIC EXERCISE: To improve strength and endurance.  Demonstration, verbal and tactile cues throughout for technique. Nustep L5x6min Seated flexion green pball 2x1  min  NEUROMUSCULAR RE-EDUCATION: To improve posture. Seated on green pball:  Ab sets 10x5  Marching x 5 B- very difficult  Rows GTB x10 Standing shoulder extension 3x12 20lb  BLE Seated rows 35lb 3x12 BLE Leg curls 25lb x 12  08/02/24 THERAPEUTIC EXERCISE: To improve strength and endurance.  Demonstration, verbal and tactile cues throughout for technique. Bike L4 x 6'  THERAPEUTIC ACTIVITIES: To improve functional performance.  Demonstration, verbal and tactile cues throughout for technique. Seated hip flexor stretch x1' B Seated ham string stretch x 1' B Seated L SB stretch over bolster with RUE overhead reach x 30 sec x 3 Seated Green Swiss ball diagonal rollouts x 15 each way Standing trunk rotation blue TB x 15 L and R Standing thoracic rotation stretch with hands to wall  MANUAL THERAPY: To promote reduced pain utilizing therapeutic massage. STM manually and with theragun to R quadratus and T-spine paraspinals  07/31/24: THERAPEUTIC EXERCISE: To improve strength, endurance, ROM, and flexibility.  Demonstration, verbal and tactile cues throughout for technique. L sidelying overhead reach RUE for SB stretch Seated L SB w/ overhead reach RUE x 1' x 2 with some stretch both sides Thoracic rotation with hands on wall to R and to L with hip twist and end range for general jt mob x 20-30 each direction Standing thoracic rotation RTB 10x5 B S/L R QL stretch x 1'  MANUAL THERAPY: To promote reduced  pain utilizing myofascial release. MFR to R thoracic/lumbar paraspinals, lats in L SL.    07/27/24: THERAPEUTIC EXERCISE: To improve strength, endurance, ROM, and flexibility.  Demonstration, verbal and tactile cues throughout for technique. NuStep-L5x31min   THERAPEUTIC ACTIVITIES: To improve functional performance.  Demonstration, verbal and tactile cues throughout for technique. L sidelying overhead reach RUE for SB stretch--no reproduction of pain Seated L SB w/ overhead reach RUE  x 1' x 2 with some stretch Seated R SB overhead reach LUE --INCREASES PAIN the L thoracic spine Seated ham string stretch x 1' x 2 BLE  NEUROMUSCULAR RE-EDUCATION: To improve posture. Standing R abd wall slide/reach with L pelvic sideglide x 2/10 Thoracic rotation with hands on wall to R and to L with hip twist and end range for general jt mob x 20-30 each direction  MANUAL THERAPY: To promote reduced pain utilizing myofascial release. MFR to R thoracic paraspinals in L SL.   Supine manual PT assist SLR ham sttretch x 1' BLE  07/19/24 THERAPEUTIC EXERCISE: To improve strength, endurance, ROM, and flexibility.  Demonstration, verbal and tactile cues throughout for technique. NuStep-L5x33min  DKTC orange ball x 10 LTR on orange pball x 10 both ways Seated pball rollout NEUROMUSCULAR RE-EDUCATION: To improve coordination, kinesthesia, posture, and proprioception.  Standing hip abd x 20 BLE Standing hip ext x15 BLE Standing hip flexion x 20 BLE Seated pallof press GTB x 10 B Seated trunk rotation GTB x 10 B Supine single pedals BLE2x10 Bridges 2x10  PATIENT EDUCATION:  Education details: instructed on thoracic rotational and sidbending stretching  Person educated: Patient Education method: Programmer, multimedia, Demonstration, Verbal cues, and MedBridgeGO app access provided Education comprehension: verbalized understanding, returned demonstration, verbal cues required, and needs further education   HOME EXERCISE PROGRAM: Access Code: [email or web address] URL: [email or web address] Date: 07/27/2024 Prepared by: Garnette Montclair  Exercises - Seated 3 Way Exercise Ball Roll Out Stretch  - 1 x daily - 7 x weekly - 3 sets - 30 hold - Seated Hamstring Stretch (BKA)  - 1 x daily - 7 x weekly - 2 sets - 2 reps - 30 sec hold - Supine Posterior Pelvic Tilt  - 1 x daily - 7 x weekly - 2 sets - 10 reps - 5-10 sec hold - Hooklying Clamshell with Resistance  - 1 x daily - 7 x weekly - 2 sets - 10  reps - Supine March with Resistance Band  - 1 x daily - 7 x weekly - 2 sets - 10 reps - Sidebending to Elbow Short Sit  - 1 x daily - 7 x weekly - 1 sets - 2 reps - 1 min hold - Seated Quadratus Lumborum Stretch in Chair  - 1 x daily - 7 x weekly - 1 sets - 2 reps - 1 min hold - Standing Lumbar Rotation Stretch  - 1 x daily - 7 x weekly - 3 sets - 10 reps  ASSESSMENT:  CLINICAL IMPRESSION:  Pt has shown improvement in LE strength. FGA score has not changed but pt reports that today he has been more off balance and shows more unsteadiness with certain portions of FGA compared to previous assessment. Worked on core stabilization exercises today with good response. He continues to benefit from skilled therapy to continue addressing core weakness, decreased mobility, and balance deficits to improve function.  EVAL: Martin Rubio is a 78 y/o M referred to PT for evaluation and treatment of back pain. Pt states that his back pain is localized  across his mid to lower back & sometimes seems to wrap around to his R abdominal area. Pt reports that he recalls his pain coming on after he had his gallbladder removed last October but, he does not know for sure if that is the cause of it. Pt says that his pain is bothersome most with bending forward to pick up objects, rolling over in bed, and getting in and out of his car at times. Today's assessment findings include deficits in lumbar ROM, B LE strength, and LE flexibility (as shown in charts above). Pt reported inc'd pain with rotational movements of the lumbar spine. Noted mild tenderness of the pt's R thorocalumbar paraspinals > L. Pt stated that although he has not fully fallen, he often feels unsteady on his feet d/t his B peripheral neuropathy.  Dave scored a 38% on the Modified Oswestry Low Back Pain Disability Questionnaire which represents moderate level of disability in activities such as personal care, sleeping, traveling, and homemaking. [place]  Dora) will benefit from skilled PT intervention to address above deficits to improve mobility, function, and activity tolerance w/ dec'd pain interference.   OBJECTIVE IMPAIRMENTS: decreased activity tolerance, decreased balance, decreased endurance, decreased knowledge of condition, decreased mobility, difficulty walking, decreased ROM, decreased strength, increased fascial restrictions, impaired perceived functional ability, increased muscle spasms, impaired flexibility, impaired sensation, improper body mechanics, postural dysfunction, and pain.   ACTIVITY LIMITATIONS: carrying, lifting, bending, sitting, standing, squatting, sleeping, stairs, transfers, bed mobility, bathing, dressing, and caring for others  PARTICIPATION LIMITATIONS: cleaning, laundry, driving, shopping, and community activity  PERSONAL FACTORS: Age, Fitness, Time since onset of injury/illness/exacerbation, and 3+ comorbidities: HTN; osteoporosis; OA; psoriatic arthritis; RA; cervical vertebral fracture; thyroid  disease - hypothyroidism; h/o Graves' disease; CKD; GERD; neuropathy are also affecting patient's functional outcome.   REHAB POTENTIAL: Good  CLINICAL DECISION MAKING: Evolving/moderate complexity  EVALUATION COMPLEXITY: Moderate   GOALS: Goals reviewed with patient? Yes  SHORT TERM GOALS: Target date: 08/01/2024    Patient will be independent with initial HEP.  Baseline: Goal status: MET_ 07/31/24  2.  Patient will report 25% improvement in pain to improve activity tolerance.  Baseline: best: 3/10, worst:8-9/10 Goal status: 07/19/24- MET  3.  Patient will perform FGA to establish current risk of fall.  Baseline: To be assessed  Goal status: MET- 07/11/2024   LONG TERM GOALS: Target date: 08/29/2024    Patient will be independent with advanced HEP  Baseline: 07/27/24:  added SB and rotational strretching Goal status: IN PROGRESS- 08/15/24  2.  Patient will increase his score on the Mod Oswestry LBP  Disability Questionnaire by at least 12% for improved in functional ability.  Baseline: 38%  8/27:  22 / 50 = 44.0 % Goal status: INITIAL  3.  Patient will report 50% improvement in back pain to improve patient's activity tolerance and participation in leisure activities (walking, archery). Baseline: 8-9/10 worst 07/27/24:  6/10 worst over last week Goal status: PARTIALLY MET- 08/15/24 no pain today  4.  Patient will be able to ascend and descend at least 8 steps to ensure patient safety to negotiate stair at home.  Baseline:  Goal status: MET- 08/10/24  5.  Patient will be able to score at least a 28 on the FGA for safe community ambulation.  Baseline: 07/11/24- 24/30 Goal status: REVISED   6.  Patient will increase B LE strength grossly to 4+/5 to increase patient function and activity tolerance.  Baseline:  Goal status: progressing 08/15/24  PLAN:  PT  FREQUENCY: 2x/week  PT DURATION: 8 weeks  PLANNED INTERVENTIONS: 97164- PT Re-evaluation, 97750- Physical Performance Testing, 97110-Therapeutic exercises, 97530- Therapeutic activity, 97112- Neuromuscular re-education, 97535- Self Care, 02859- Manual therapy, [phone]- Gait training, [phone]- Aquatic Therapy, [phone]- Electrical stimulation (unattended), [id number]- Ultrasound, [id number]- Traction (mechanical), [id number]- Ionotophoresis 4mg /ml Dexamethasone , 79439 (1-2 muscles), 20561 (3+ muscles)- Dry Needling, Patient/Family education, Balance training, Stair training, Taping, Joint mobilization, Spinal mobilization, Cryotherapy, and Moist heat.  PLAN FOR NEXT SESSION: modified side planks; quadruped core stab if pt can tolerate;  Azir Muzyka L Dainel Arcidiacono, PTA 08/15/2024, 10:26 AM

## 2024-08-15 NOTE — Progress Notes (Signed)
 Pt here for monthly B12 injection per original order dated 04/04/24 per Dr. Amon:  I recommend to start B12 shots monthly to see if that helps your neuropathy.Please call the office and arrange for monthly B12 shots by my nurse  Last B12 injection: 07/12/24  Last B12 level: 04/04/24   B12 1000mcg given IM, left deltoid and pt tolerated injection well.  Next B12 injection scheduled for: 09/15/24

## 2024-08-15 NOTE — Telephone Encounter (Signed)
 Was originally from podiatry in 2022, PCP took over in 2024- for chronic foot itching.

## 2024-08-15 NOTE — Telephone Encounter (Signed)
 Prior Authorization form/request asks a question that requires your assistance. Please see the question below and advise accordingly. The PA will not be submitted until the necessary information is received.  PLEASE BE ADVISED IF YOU WOULD LIKE FOR ME TO PROCEED WITH PA CAN YOU PROVIDE A DIAGNOSIS FOR HYDROXYZINE  PAMOATE

## 2024-08-16 ENCOUNTER — Other Ambulatory Visit (HOSPITAL_COMMUNITY): Payer: Self-pay

## 2024-08-16 NOTE — Telephone Encounter (Signed)
 Pharmacy Patient Advocate Encounter  Received notification from [employer] that Prior Authorization for hydrOXYzine  Pamoate 25MG  capsules  has been APPROVED from 12/08/2023 to 12/06/2024. Ran test claim, Copay is $0.00.   PLEASE BE ADVISED  I HAVE CALLED PHAMARY AND THE PHARMACIST RE-PROCESSED AND  WILL LET PT KNOW THAT IT IS READY!  PA #/Case ID/Reference #: [id number]

## 2024-08-17 ENCOUNTER — Telehealth: Payer: Self-pay | Admitting: Internal Medicine

## 2024-08-17 ENCOUNTER — Encounter: Admitting: Physical Therapy

## 2024-08-17 DIAGNOSIS — E039 Hypothyroidism, unspecified: Secondary | ICD-10-CM | POA: Diagnosis not present

## 2024-08-17 DIAGNOSIS — G629 Polyneuropathy, unspecified: Secondary | ICD-10-CM

## 2024-08-17 DIAGNOSIS — K219 Gastro-esophageal reflux disease without esophagitis: Secondary | ICD-10-CM | POA: Diagnosis not present

## 2024-08-17 DIAGNOSIS — L4050 Arthropathic psoriasis, unspecified: Secondary | ICD-10-CM | POA: Diagnosis not present

## 2024-08-17 DIAGNOSIS — H9313 Tinnitus, bilateral: Secondary | ICD-10-CM | POA: Diagnosis not present

## 2024-08-17 DIAGNOSIS — R490 Dysphonia: Secondary | ICD-10-CM | POA: Diagnosis not present

## 2024-08-17 DIAGNOSIS — E785 Hyperlipidemia, unspecified: Secondary | ICD-10-CM | POA: Diagnosis not present

## 2024-08-17 DIAGNOSIS — R2689 Other abnormalities of gait and mobility: Secondary | ICD-10-CM | POA: Diagnosis not present

## 2024-08-17 NOTE — Telephone Encounter (Signed)
 Copied from CRM #[id number]. Topic: Referral - Request for Referral >> Aug 17, 2024  2:14 PM Suzen RAMAN wrote: Did the patient discuss referral with their provider in the last year? Yes (If No - schedule appointment) (If Yes - send message)  Appointment offered? No  Type of order/referral and detailed reason for visit: Neurology   Preference of office, provider, location: within [facility]  If referral order, have you been seen by this specialty before? Yes (If Yes, this issue or another issue? When? Where?[facility], [facility] per caller patient would like a second opinion  Can we respond through MyChart? Yes

## 2024-08-17 NOTE — Telephone Encounter (Signed)
 Error CRM sent, neurology referral for?

## 2024-08-18 ENCOUNTER — Ambulatory Visit

## 2024-08-18 DIAGNOSIS — Z23 Encounter for immunization: Secondary | ICD-10-CM | POA: Diagnosis not present

## 2024-08-18 NOTE — Telephone Encounter (Signed)
 Error/Investigation Details: Delanna Carpen with [facility] called in to see if she could get a referral for a new Neurologists for patient. Per Delanna the current neurologist the patient has he is not satisfied with and need assistance with getting a new one. Patient would like a Neurologist in the [place] area. Patients current Neurologist is Surveyor, quantity. Per Delanna she just spoke with medical records and sent over a consent to be able to talk with the office. Specialist never asked Delanna what the patient was seeing a Neurologist for. Specialist should have been more detailed in the referral CRM and also did not get a contact number for Sade.  Pulled call, Call ID/JTAPI ID: [id number]

## 2024-08-18 NOTE — Telephone Encounter (Signed)
 Spoke w/ Martin Rubio- wanted to confirm that he is requesting this neurology referral. Martin Rubio stated yes, he would like to discuss his neuropathy with another neurologist. Referral placed to [facility]

## 2024-08-23 ENCOUNTER — Telehealth: Payer: Self-pay

## 2024-08-23 ENCOUNTER — Ambulatory Visit: Admitting: Rehabilitation

## 2024-08-23 NOTE — Telephone Encounter (Signed)
 Patient called and says someone called him from this number. Advised nothing is in the chart indicating a call was made to him.

## 2024-08-28 ENCOUNTER — Other Ambulatory Visit: Payer: Self-pay | Admitting: Internal Medicine

## 2024-08-29 ENCOUNTER — Ambulatory Visit: Admitting: Rehabilitation

## 2024-08-29 DIAGNOSIS — M6281 Muscle weakness (generalized): Secondary | ICD-10-CM | POA: Diagnosis not present

## 2024-08-29 DIAGNOSIS — R262 Difficulty in walking, not elsewhere classified: Secondary | ICD-10-CM | POA: Diagnosis not present

## 2024-08-29 DIAGNOSIS — R2681 Unsteadiness on feet: Secondary | ICD-10-CM | POA: Diagnosis not present

## 2024-08-29 DIAGNOSIS — M62830 Muscle spasm of back: Secondary | ICD-10-CM

## 2024-08-29 DIAGNOSIS — M5459 Other low back pain: Secondary | ICD-10-CM

## 2024-08-29 DIAGNOSIS — R293 Abnormal posture: Secondary | ICD-10-CM

## 2024-08-29 NOTE — Therapy (Signed)
 OUTPATIENT PHYSICAL THERAPY THORACOLUMBAR TREATMENT   Patient Name: Martin Rubio MRN: [id number] DOB:1946/09/25, 78 y.o., male Today's Date: 08/29/2024  END OF SESSION:  PT End of Session - 08/29/24 1303     Visit Number 11    Date for Recertification  08/29/24    Authorization Type Medicare & [place]  Life    PT Start Time 1300    PT Stop Time 1355    PT Time Calculation (min) 55 min    Activity Tolerance Patient tolerated treatment well    Behavior During Therapy [facility] for tasks assessed/performed               Past Medical History:  Diagnosis Date   Allergy    Bronchitis    Cataract    Chronic kidney disease    Diverticulosis    FH: BPH (benign prostatic hypertrophy)    GERD (gastroesophageal reflux disease)    History of Graves' disease 01/19/2018   S/p ablation   HTN (hypertension)    Hyperlipidemia    Neuromuscular disorder (HCC)    Obesity    Psoriatic arthritis (HCC)    Rheumatoid arthritis (HCC) 08/07/2014   Sleep apnea    CPAP   Thyroid  disease    Past Surgical History:  Procedure Laterality Date   ABDOMINAL AORTOGRAM W/LOWER EXTREMITY N/A 01/14/2022   Procedure: ABDOMINAL AORTOGRAM W/LOWER EXTREMITY;  Surgeon: Darron Deatrice LABOR, MD;  Location: [facility] IN[facility];  Service: Cardiovascular;  Laterality: N/A;   ATRIAL FIBRILLATION ABLATION N/A 12/24/2022   Procedure: ATRIAL FIBRILLATION ABLATION;  Surgeon: Nancey Eulas BRAVO, MD;  Location: [facility] INV[facility];  Service: Cardiovascular;  Laterality: N/A;   CHOLECYSTECTOMY N/A 09/18/2023   Procedure: LAPAROSCOPIC CHOLECYSTECTOMY WITH INTRAOPERATIVE CHOLANGIOGRAM;  Surgeon: Kinsinger, Herlene Righter, MD;  Location: [facility] OR;  Service: General;  Laterality: N/A;   INGUINAL HERNIA REPAIR     bilateral   KNEE SURGERY     bilateral arthroscopic   l foot surgery Left 12/2020   PACEMAKER IMPLANT N/A 12/13/2019   Procedure: PACEMAKER IMPLANT;  Surgeon: Waddell Danelle ORN, MD;  Location: [facility];  Service:  Cardiovascular;  Laterality: N/A;   TRANSURETHRAL RESECTION OF PROSTATE     UMBILICAL HERNIA REPAIR     Patient Active Problem List   Diagnosis Date Noted   Chronic diastolic heart failure (HCC) 08/01/2024   B12 deficiency 04/05/2024   Obesity (BMI 30-39.9) 09/17/2023   Benign paroxysmal positional vertigo 06/11/2023   Aortic atherosclerosis 03/18/2021   Bruit 02/09/2021   Aortic valve sclerosis 02/09/2021   PCP notes >>>>>>>>>>>>>>> 01/10/2020   Age related osteoporosis 01/10/2020   CKD stage 3b, GFR 30-44 ml/min (HCC) 12/16/2019   Pacemaker 12/16/2019   Sick sinus syndrome (HCC) 12/16/2019   Paroxysmal atrial fibrillation (HCC) 10/26/2019   Nodule of flexor tendon sheath 04/13/2019   Low bone mass 01/19/2018   Iliac aneurysm 06/16/2017   Hypertrophic cardiomyopathy (HCC) 06/16/2017   Essential hypertension 12/16/2016   Hyperlipidemia LDL goal <100 12/16/2016   Asthma in adult, mild intermittent, uncomplicated 10/12/2016   Collapsed vertebra, not elsewhere classified, cervical region, initial encounter for fracture (HCC) 10/08/2016   Annual physical exam 10/08/2016   Neuropathy 10/08/2016   Contracture of ankle and foot joint 08/07/2014   Esophageal reflux 08/07/2014   Organic impotence 08/07/2014   Obstructive sleep apnea 08/07/2014   Osteoarthritis 08/07/2014   Sensorineural hearing loss 08/07/2014   Tinnitus 08/07/2014   LVH (left ventricular hypertrophy) 12/25/2012   Diverticulosis 09/01/2011  Hypothyroidism 06/09/2011   Postablative hypothyroidism 06/09/2011   Psoriatic arthritis (HCC) 06/09/2011   BPH with obstruction/lower urinary tract symptoms 06/09/2011    PCP: Amon Aloysius BRAVO, MD   REFERRING PROVIDER: Amon Aloysius BRAVO, MD   REFERRING DIAG: M54.9 (ICD-10-CM) - Acute back pain, unspecified back location, unspecified back pain laterality   Rationale for Evaluation and Treatment: Rehabilitation  THERAPY DIAG:  Other low back pain  Muscle spasm of back  Muscle  weakness (generalized)  Abnormal posture  Unsteadiness on feet  Difficulty in walking, not elsewhere classified  ONSET DATE: ~ Oct 2024  NEXT MD VISIT: 08/01/2024   SUBJECTIVE:                                                                                                                                                                                           SUBJECTIVE STATEMENT: Patient rates back pain 1-2/10 today.  Patient states feels about 90% improvement from initially.  Pain is more centralized with minimal to no lateral R sided pain.    EVAL: Pt reports pain across his back on both sides beneath ribs. He has his gallbladder removed (Oct 2024) and he thought that was the cause of his pain but he is unsure. The pain came on after that surgery that he can recall. He says that he thinks he woke up one morning with his pain. Difficulty w/ picking up things and getting in and out of car   PERTINENT HISTORY:  Atrial fibrillation ablation 12/24/22; SSS s/p pacemaker 12/13/19; B knee scope; L foot surgery; umbilical and B inguinal hernia repairs; HTN; osteoporosis; OA; psoriatic arthritis; RA; cervical vertebral fracture; thyroid  disease - hypothyroidism; h/o Graves' disease; CKD; GERD; neuropathy; hearing loss; acute vertigo as of early June   PAIN:  Are you having pain? Yes: NPRS scale: 1/10; 2/10 in R foot/heel Pain location: across lower back, beneath level of ribs , sometimes to R abdominal area  Pain description: constant, sharp, breath taking Aggravating factors: bending forward, rolling over in bed Relieving factors: muscle relaxers taken recently   PRECAUTIONS: ICD/Pacemaker  RED FLAGS: None   WEIGHT BEARING RESTRICTIONS: No  FALLS:  Has patient fallen in last 6 months? No-pt has come close d/t pain and neuropathy   LIVING ENVIRONMENT: Lives with: lives with their spouse Lives in: House/apartment Stairs: Yes: Internal: 8 or 7 steps; on right going up and down and  External: 1 steps; none Has following equipment at home: Quad cane small base and Grab bars  OCCUPATION: Retired  PLOF: Independent and Leisure: walking with wife at [facility] [employer] [facility], formerly participated in [employer] at the  [employer]   PATIENT GOALS: no pain in general, return to exercise/walking w/o hurting, wants to get into archery   OBJECTIVE:  Note: Objective measures were completed at Evaluation unless otherwise noted.  DIAGNOSTIC FINDINGS:  EXAM: LUMBAR SPINE - COMPLETE 4+ VIEW; THORACIC SPINE 2 VIEW 06/14/24 FINDINGS: Right convex thoracolumbar curve.No acute fracture or evidence of traumatic listhesis in the thoracolumbar spine. Multilevel spondylosis with anterior osteophytes throughout the thoracic and lumbar spine. Intervertebral disc space height is maintained. Mild lower lumbar facet arthropathy.  IMPRESSION: 1. No acute fracture or traumatic listhesis in the thoracolumbar spine. 2. Mild multilevel spondylosis.  PATIENT SURVEYS:  Modified Oswestry: 38% Interpretation of scores: Score Category Description  0-20% Minimal Disability The patient can cope with most living activities. Usually no treatment is indicated apart from advice on lifting, sitting and exercise  21-40% Moderate Disability The patient experiences more pain and difficulty with sitting, lifting and standing. Travel and social life are more difficult and they may be disabled from work. Personal care, sexual activity and sleeping are not grossly affected, and the patient can usually be managed by conservative means  41-60% Severe Disability Pain remains the main problem in this group, but activities of daily living are affected. These patients require a detailed investigation  61-80% Crippled Back pain impinges on all aspects of the patient's life. Positive intervention is required  81-100% Bed-bound  These patients are either bed-bound or exaggerating their symptoms  Bluford FORBES Zoe DELENA Karon DELENA, et al. Surgery versus conservative management of stable thoracolumbar fracture: the PRESTO feasibility RCT. [place] ([place]): [employer]; 2021 Nov. [facility] Technology Assessment, No. 25.62.) Appendix 3, Oswestry Disability Index category descriptors. Available from: FindJewelers.cz  Minimally Clinically Important Difference (MCID) = 12.8%  COGNITION: Overall cognitive status: Within functional limits for tasks assessed     SENSATION: WFL- pt has known peripheral neuropathy in his B feet, diminished sensation into legs some above the ankle    MUSCLE LENGTH: Hamstrings: Right mod tight; Left mod-severe tightness  Hip Flexors: R mod tight, L mod tight  Quads: R mod-severe, L mod severe Piriformis: L mod-severe tight, R mod tight  Hip IR: L mod-severe tight, R mod tight  POSTURE: rounded shoulders, forward head, increased lumbar lordosis, and increased thoracic kyphosis  PALPATION: TTP: R > L thoracolumbar paraspinals, tightness, QL (R>L tightness),   LUMBAR ROM:   AROM eval  Flexion WFL- bends knees to touch toes   Extension 25% limited   Right lateral flexion Mid calf   Left lateral flexion Mid calf  Right rotation Note p! On L (less than R)  Left rotation Limited p! On R side    (Blank rows = not tested)  LOWER EXTREMITY MMT:    MMT Right eval Left eval R 08/15/24 L 08/15/24 RLE 08/29/24 LLE 08/29/24  Hip flexion 4+ 4+   5 5  Hip extension 3- 3- 4 4 4+ 4+  Hip abduction 4- 4- 4 4 5 5   Hip adduction 4 3+ 4- 4- 5 5  Hip internal rotation 4 4 4+ 4+- hip p! 5 5  Hip external rotation 4 4 4+ 4 5 5   Knee flexion 4+ 4+   5 5  Knee extension 5 5   5 5   Ankle dorsiflexion 4- 4- 4+ 4+ 5 5  Ankle plantarflexion        Ankle inversion        Ankle eversion         (Blank  rows = not tested)  LUMBAR SPECIAL TESTS:  Not assessed   FUNCTIONAL TESTS:  Not assessed   GAIT: Distance walked: clinic distances  Assistive device  utilized: None Level of assistance: Complete Independence Comments: pt ambulated into clinic w/o any devices. Displays rounded shoulder, some thoracic kyphosis   TREATMENT DATE:                                                                                                                               08/29/24 THERAPEUTIC EXERCISE: To improve strength.  Demonstration, verbal and tactile cues throughout for technique. NuStep L5 x  8'  THERAPEUTIC ACTIVITIES: To improve functional performance.  Demonstration, verbal and tactile cues throughout for technique. Seated ham string stretch x 1' x 3 BLE Seated L Sidebend over peanut ball reaching overhead with pull from contralateral UE stretch x 1' x 3  Standing L SB stretch arms holding door jam on L, hip lateral shift to the R x 1' x 3 Seated R latissimus stretch holding door jam on L with RUE leaning forward x 1' x 3 Standing trunk rotation stretch w/ hands on wall, turning body away x 1' x 2 each side Recheck strength  NEUROMUSCULAR RE-EDUCATION: To improve kinesthesia, posture, and proprioception. Sideplank from knees x 10 B Quadruped marching w/ TA x 10 BUE Quadruped hip ext x 10 BLE  08/15/24 Nustep L5x52min Side plank x 10 B Seated on green pball:  Shoulder rows x 20 GTB  Pallof press x 20 GTB FGA and LE MMT completed  [facility] PT Assessment - 08/15/24 0001       Functional Gait  Assessment   Gait Level Surface Walks 20 ft in less than 7 sec but greater than 5.5 sec, uses assistive device, slower speed, mild gait deviations, or deviates 6-10 in outside of the 12 in walkway width.    Change in Gait Speed Able to smoothly change walking speed without loss of balance or gait deviation. Deviate no more than 6 in outside of the 12 in walkway width.    Gait with Horizontal Head Turns Performs head turns smoothly with slight change in gait velocity (eg, minor disruption to smooth gait path), deviates 6-10 in outside 12 in walkway width, or uses  an assistive device.    Gait with Vertical Head Turns Performs head turns with no change in gait. Deviates no more than 6 in outside 12 in walkway width.    Gait and Pivot Turn Pivot turns safely within 3 sec and stops quickly with no loss of balance.    Step Over Obstacle Is able to step over 2 stacked shoe boxes taped together (9 in total height) without changing gait speed. No evidence of imbalance.    Gait with Narrow Base of Support Ambulates 7-9 steps.    Gait with Eyes Closed Walks 20 ft, uses assistive device, slower speed, mild gait deviations, deviates 6-10 in outside 12 in walkway width. Ambulates  20 ft in less than 9 sec but greater than 7 sec.    Ambulating Backwards Walks 20 ft, uses assistive device, slower speed, mild gait deviations, deviates 6-10 in outside 12 in walkway width.    Steps Alternating feet, must use rail.    Total Score 24           PATIENT EDUCATION:  Education details: instructed on thoracic rotational and sidbending stretching  Person educated: Patient Education method: Explanation, Demonstration, Verbal cues, and MedBridgeGO app access provided Education comprehension: verbalized understanding, returned demonstration, verbal cues required, and needs further education   HOME EXERCISE PROGRAM: Access Code: [email or web address] URL: [email or web address] Date: 07/27/2024 Prepared by: Garnette Montclair  Exercises - Seated 3 Way Exercise Ball Roll Out Stretch  - 1 x daily - 7 x weekly - 3 sets - 30 hold - Seated Hamstring Stretch (BKA)  - 1 x daily - 7 x weekly - 2 sets - 2 reps - 30 sec hold - Supine Posterior Pelvic Tilt  - 1 x daily - 7 x weekly - 2 sets - 10 reps - 5-10 sec hold - Hooklying Clamshell with Resistance  - 1 x daily - 7 x weekly - 2 sets - 10 reps - Supine March with Resistance Band  - 1 x daily - 7 x weekly - 2 sets - 10 reps - Sidebending to Elbow Short Sit  - 1 x daily - 7 x weekly - 1 sets - 2 reps - 1 min hold - Seated Quadratus  Lumborum Stretch in Chair  - 1 x daily - 7 x weekly - 1 sets - 2 reps - 1 min hold - Standing Lumbar Rotation Stretch  - 1 x daily - 7 x weekly - 3 sets - 10 reps  ASSESSMENT:  CLINICAL IMPRESSION: Patient has been seen x 2 months PT for back pain.  He has made good improvement and states he feels 80% improved.  He reports his pain is now intermittent and central thoracic spine only.  He is not having R radiation around his trunk as he did initially.  He has met all of his goals for PT with the exception of his balance goal on the Functional Gait assessment.  He made improvement to 24/30, though which is good.   He is to continue with his HEP and home and call us  with any further questions.      EVAL: Santos Sollenberger is a 78 y/o M referred to PT for evaluation and treatment of back pain. Pt states that his back pain is localized across his mid to lower back & sometimes seems to wrap around to his R abdominal area. Pt reports that he recalls his pain coming on after he had his gallbladder removed last October but, he does not know for sure if that is the cause of it. Pt says that his pain is bothersome most with bending forward to pick up objects, rolling over in bed, and getting in and out of his car at times. Today's assessment findings include deficits in lumbar ROM, B LE strength, and LE flexibility (as shown in charts above). Pt reported inc'd pain with rotational movements of the lumbar spine. Noted mild tenderness of the pt's R thorocalumbar paraspinals > L. Pt stated that although he has not fully fallen, he often feels unsteady on his feet d/t his B peripheral neuropathy.  Dave scored a 38% on the Modified Oswestry Low Back Pain Disability Questionnaire which represents moderate level of disability  in activities such as personal care, sleeping, traveling, and homemaking. [place] Dora) will benefit from skilled PT intervention to address above deficits to improve mobility, function, and  activity tolerance w/ dec'd pain interference.   OBJECTIVE IMPAIRMENTS: decreased activity tolerance, decreased balance, decreased endurance, decreased knowledge of condition, decreased mobility, difficulty walking, decreased ROM, decreased strength, increased fascial restrictions, impaired perceived functional ability, increased muscle spasms, impaired flexibility, impaired sensation, improper body mechanics, postural dysfunction, and pain.   ACTIVITY LIMITATIONS: carrying, lifting, bending, sitting, standing, squatting, sleeping, stairs, transfers, bed mobility, bathing, dressing, and caring for others  PARTICIPATION LIMITATIONS: cleaning, laundry, driving, shopping, and community activity  PERSONAL FACTORS: Age, Fitness, Time since onset of injury/illness/exacerbation, and 3+ comorbidities: HTN; osteoporosis; OA; psoriatic arthritis; RA; cervical vertebral fracture; thyroid  disease - hypothyroidism; h/o Graves' disease; CKD; GERD; neuropathy are also affecting patient's functional outcome.   REHAB POTENTIAL: Good  CLINICAL DECISION MAKING: Evolving/moderate complexity  EVALUATION COMPLEXITY: Moderate   GOALS: Goals reviewed with patient? Yes  SHORT TERM GOALS: Target date: 08/01/2024    Patient will be independent with initial HEP.  Baseline: Goal status: MET_ 07/31/24  2.  Patient will report 25% improvement in pain to improve activity tolerance.  Baseline: best: 3/10, worst:8-9/10 Goal status: 07/19/24- MET  3.  Patient will perform FGA to establish current risk of fall.  Baseline: To be assessed  Goal status: MET- 07/11/2024   LONG TERM GOALS: Target date: 08/29/2024    Patient will be independent with advanced HEP  Baseline: 07/27/24:  added SB and rotational strretching Goal status: MET 9/23  2.  Patient will increase his score on the Mod Oswestry LBP Disability Questionnaire by at least 12% for improved in functional ability.  Baseline: 38%  8/27:  22 / 50 = 44.0  % 9/23: 18% Goal status: MET (above goal should read decrease his score....SABRANOT increase his score)  3.  Patient will report 50% improvement in back pain to improve patient's activity tolerance and participation in leisure activities (walking, archery). Baseline: 8-9/10 worst 07/27/24:  6/10 worst over last week Goal status: met- 08/15/24 no pain today  4.  Patient will be able to ascend and descend at least 8 steps to ensure patient safety to negotiate stair at home.  Baseline:  Goal status: MET- 08/10/24  5.  Patient will be able to score at least a 28 on the FGA for safe community ambulation.  Baseline: 07/11/24- 24/30 Goal status: REVISED   6.  Patient will increase B LE strength grossly to 4+/5 to increase patient function and activity tolerance.  Baseline:  Goal status: MET  PLAN:  PT FREQUENCY: 2x/week  PT DURATION: 8 weeks  PLANNED INTERVENTIONS: 97164- PT Re-evaluation, 97750- Physical Performance Testing, 97110-Therapeutic exercises, 97530- Therapeutic activity, 97112- Neuromuscular re-education, 97535- Self Care, 02859- Manual therapy, [phone]- Gait training, [phone]- Aquatic Therapy, [phone]- Electrical stimulation (unattended), [id number]- Ultrasound, [id number]- Traction (mechanical), [id number]- Ionotophoresis 4mg /ml Dexamethasone , 79439 (1-2 muscles), [id number] (3+ muscles)- Dry Needling, Patient/Family education, Balance training, Stair training, Taping, Joint mobilization, Spinal mobilization, Cryotherapy, and Moist heat.  PLAN FOR NEXT SESSION: DC PT  PHYSICAL THERAPY DISCHARGE SUMMARY  Visits from Start of Care: 11  Current functional level related to goals / functional outcomes: Independent with HEP, Rates pain 1-2/10 to 0/10 a lot of the time; pain has centralized and no longer laterally; ODI has improved to 18%;  strength is 5/5 BLE   Remaining deficits: Residual pain and FGA only made it to 24/30 for balance improvement but not  the goal of 77   Education / Equipment: Patient is  independent with all home exercises and advised to continue daily as tolerated and call us  with any questions    Patient agrees to discharge. Patient goals were met. Patient is being discharged due to meeting the stated rehab goals.   Pax Reasoner, PT 08/29/2024, 8:05 PM

## 2024-08-30 ENCOUNTER — Telehealth: Payer: Self-pay | Admitting: Neurology

## 2024-08-30 NOTE — Telephone Encounter (Signed)
 Sade Bosewell([facility]) which identified herself as a health care advocate is asking it be noted she is sending a fax of introductory that she is a assisting pt with care and would like to talk to Dr Vear about plan of care for pt.  She is sending a fax with her information: NPI and such.

## 2024-09-05 DIAGNOSIS — L718 Other rosacea: Secondary | ICD-10-CM | POA: Diagnosis not present

## 2024-09-05 DIAGNOSIS — K219 Gastro-esophageal reflux disease without esophagitis: Secondary | ICD-10-CM | POA: Diagnosis not present

## 2024-09-05 DIAGNOSIS — Z85828 Personal history of other malignant neoplasm of skin: Secondary | ICD-10-CM | POA: Diagnosis not present

## 2024-09-05 DIAGNOSIS — D1801 Hemangioma of skin and subcutaneous tissue: Secondary | ICD-10-CM | POA: Diagnosis not present

## 2024-09-05 DIAGNOSIS — E039 Hypothyroidism, unspecified: Secondary | ICD-10-CM | POA: Diagnosis not present

## 2024-09-05 DIAGNOSIS — H9313 Tinnitus, bilateral: Secondary | ICD-10-CM | POA: Diagnosis not present

## 2024-09-05 DIAGNOSIS — E785 Hyperlipidemia, unspecified: Secondary | ICD-10-CM | POA: Diagnosis not present

## 2024-09-05 DIAGNOSIS — L2089 Other atopic dermatitis: Secondary | ICD-10-CM | POA: Diagnosis not present

## 2024-09-05 DIAGNOSIS — Z129 Encounter for screening for malignant neoplasm, site unspecified: Secondary | ICD-10-CM | POA: Diagnosis not present

## 2024-09-05 DIAGNOSIS — R2689 Other abnormalities of gait and mobility: Secondary | ICD-10-CM | POA: Diagnosis not present

## 2024-09-05 DIAGNOSIS — L4050 Arthropathic psoriasis, unspecified: Secondary | ICD-10-CM | POA: Diagnosis not present

## 2024-09-05 DIAGNOSIS — G629 Polyneuropathy, unspecified: Secondary | ICD-10-CM | POA: Diagnosis not present

## 2024-09-05 DIAGNOSIS — R490 Dysphonia: Secondary | ICD-10-CM | POA: Diagnosis not present

## 2024-09-05 DIAGNOSIS — L218 Other seborrheic dermatitis: Secondary | ICD-10-CM | POA: Diagnosis not present

## 2024-09-05 DIAGNOSIS — L821 Other seborrheic keratosis: Secondary | ICD-10-CM | POA: Diagnosis not present

## 2024-09-13 ENCOUNTER — Ambulatory Visit: Payer: Medicare Other

## 2024-09-13 DIAGNOSIS — I495 Sick sinus syndrome: Secondary | ICD-10-CM

## 2024-09-14 LAB — CUP PACEART REMOTE DEVICE CHECK
Battery Remaining Longevity: 103 mo
Battery Voltage: 2.99 V
Brady Statistic AP VP Percent: 29.69 %
Brady Statistic AP VS Percent: 60.6 %
Brady Statistic AS VP Percent: 0.35 %
Brady Statistic AS VS Percent: 9.36 %
Brady Statistic RA Percent Paced: 90.13 %
Brady Statistic RV Percent Paced: 30.04 %
Date Time Interrogation Session: 20251008121928
Implantable Lead Connection Status: 753985
Implantable Lead Connection Status: 753985
Implantable Lead Implant Date: 20210106
Implantable Lead Implant Date: 20210106
Implantable Lead Location: 753859
Implantable Lead Location: 753860
Implantable Lead Model: 3830
Implantable Lead Model: 5076
Implantable Pulse Generator Implant Date: 20210106
Lead Channel Impedance Value: 323 Ohm
Lead Channel Impedance Value: 361 Ohm
Lead Channel Impedance Value: 380 Ohm
Lead Channel Impedance Value: 513 Ohm
Lead Channel Pacing Threshold Amplitude: 0.625 V
Lead Channel Pacing Threshold Amplitude: 0.75 V
Lead Channel Pacing Threshold Pulse Width: 0.4 ms
Lead Channel Pacing Threshold Pulse Width: 0.4 ms
Lead Channel Sensing Intrinsic Amplitude: 1.625 mV
Lead Channel Sensing Intrinsic Amplitude: 1.625 mV
Lead Channel Sensing Intrinsic Amplitude: 26.375 mV
Lead Channel Sensing Intrinsic Amplitude: 26.375 mV
Lead Channel Setting Pacing Amplitude: 1.5 V
Lead Channel Setting Pacing Amplitude: 2.5 V
Lead Channel Setting Pacing Pulse Width: 0.4 ms
Lead Channel Setting Sensing Sensitivity: 2 mV
Zone Setting Status: 755011
Zone Setting Status: 755011

## 2024-09-15 ENCOUNTER — Ambulatory Visit

## 2024-09-15 DIAGNOSIS — E538 Deficiency of other specified B group vitamins: Secondary | ICD-10-CM

## 2024-09-15 MED ORDER — CYANOCOBALAMIN 1000 MCG/ML IJ SOLN
1000.0000 ug | Freq: Once | INTRAMUSCULAR | Status: AC
  Administered 2024-09-15: 1000 ug via INTRAMUSCULAR

## 2024-09-15 NOTE — Progress Notes (Signed)
 Remote PPM Transmission

## 2024-09-15 NOTE — Progress Notes (Signed)
 Pt here for monthly B12 injection per Jose Paz MD   B12 1000mcg given left deltoid IM and pt tolerated injection well.   Next B12 injection scheduled for next month

## 2024-09-17 NOTE — Progress Notes (Deleted)
 Cardiology Office Note:   Date:  09/17/2024  ID:  Martin Rubio, DOB 1946/01/23, MRN [id number] PCP: Amon Aloysius BRAVO, MD  [facility] Providers Cardiologist:  Lynwood Schilling, MD Electrophysiologist:  Eulas BRAVO Furbish, MD {  History of Present Illness:   Martin Rubio is a 78 y.o. male for follow up of HTN.  His echo in Nov of 2016 demonstrated no septal hypertrophy.  He has had atrial fib/flutter paroxysmally with slow ventricular rates.  He had pacemaker placement.   He continued to have PAF and was started on Tikosyn .   Angiography showed small to medium size aortic aneurysm above the iliac bifurcation, severe occlusive disease affecting bilateral common iliac artery worse on the left side with large sized aneurysm on the left side and medium size on the right side with very tortuous iliac arteries.  Endovascular options were limited .  The patient was seen by Dr. Lanis for consultation.  Given that his claudication was not severe and aneurysm size was below the threshold for repair, it was elected to continue medical therapy and observation for now.     ***   ***  Since I last saw him he has seen EP.   He had atrial fibrillation in 2024.  He is status post pacemaker placement 2021 for heart block.  From a cardiac standpoint he had no problems.  He did see Dr. Furbish and it was suggested that he might want to come off his Tikosyn  but it was left up to him.  The patient is actually done quite well from a cardiovascular standpoint.  He does not feel fibrillation.  He does not have any presyncope or syncope.  He is not having any chest pressure, neck or arm discomfort.  He tries to go to the gym.  He has been most bothered by some joint pains in his right foot pain and is going to see his orthopedist.  His biggest issue has been ongoing nausea.  He has a lot of this with excessive saliva production at times.  He says it is related to  ulcers in his stomach.  As he was told this years ago  by gastroenterologist although he has not actively seen a gastroenterologist.  ROS: ***  Studies Reviewed:    EKG:       ***  Risk Assessment/Calculations:   {Does this patient have ATRIAL FIBRILLATION?:[phone]} No BP recorded.  {Refresh Note OR Click here to enter BP  :1}***        Physical Exam:   VS:  There were no vitals taken for this visit.   Wt Readings from Last 3 Encounters:  08/01/24 213 lb 6 oz (96.8 kg)  07/24/24 215 lb 8 oz (97.8 kg)  06/14/24 215 lb 8 oz (97.8 kg)     GEN: Well nourished, well developed in no acute distress NECK: No JVD; No carotid bruits CARDIAC: ***RR, *** murmurs, rubs, gallops RESPIRATORY:  Clear to auscultation without rales, wheezing or rhonchi  ABDOMEN: Soft, non-tender, non-distended EXTREMITIES:  No edema; No deformity   ASSESSMENT AND PLAN:   Atrial fib:  ***  I have suggested that he stay on the Tikosyn  because previously he had such problems with this rhythm.  Even though this might have been managed successfully with ablation alone he is not having any trouble with the Tikosyn  and tolerates it.  He will remain on this and the meds as listed.  He tolerates anticoagulation.    Secondary Hypercoagulable State:  ***  Continue current therapy.    HTN:    The blood pressure is at target.  No change in therapy.   Obstructive sleep apnea: **  Compliance with sleep apnea has been encouraged.   CHB:  ***   He is up-to-date with follow-up.   CAD: ***  He has no anginal send continue with risk reduction.   CKD: Creatinine is ***  1.67 and he is followed by nephrology.   Nausea: This has been a big issue and so I am going to get him to see a gastroenterologist.     Follow up ***  Signed, Lynwood Schilling, MD

## 2024-09-18 ENCOUNTER — Ambulatory Visit: Admitting: Cardiology

## 2024-09-18 ENCOUNTER — Other Ambulatory Visit: Payer: Self-pay | Admitting: Internal Medicine

## 2024-09-18 DIAGNOSIS — I480 Paroxysmal atrial fibrillation: Secondary | ICD-10-CM

## 2024-09-18 DIAGNOSIS — I2510 Atherosclerotic heart disease of native coronary artery without angina pectoris: Secondary | ICD-10-CM

## 2024-09-18 DIAGNOSIS — I10 Essential (primary) hypertension: Secondary | ICD-10-CM

## 2024-09-20 ENCOUNTER — Ambulatory Visit: Payer: Self-pay | Admitting: Internal Medicine

## 2024-09-20 DIAGNOSIS — M25562 Pain in left knee: Secondary | ICD-10-CM | POA: Diagnosis not present

## 2024-09-20 DIAGNOSIS — M79671 Pain in right foot: Secondary | ICD-10-CM | POA: Diagnosis not present

## 2024-09-20 DIAGNOSIS — N1832 Chronic kidney disease, stage 3b: Secondary | ICD-10-CM | POA: Diagnosis not present

## 2024-09-20 DIAGNOSIS — M25561 Pain in right knee: Secondary | ICD-10-CM | POA: Diagnosis not present

## 2024-09-20 DIAGNOSIS — L4050 Arthropathic psoriasis, unspecified: Secondary | ICD-10-CM | POA: Diagnosis not present

## 2024-09-20 DIAGNOSIS — M65331 Trigger finger, right middle finger: Secondary | ICD-10-CM | POA: Diagnosis not present

## 2024-09-20 DIAGNOSIS — M5136 Other intervertebral disc degeneration, lumbar region with discogenic back pain only: Secondary | ICD-10-CM | POA: Diagnosis not present

## 2024-09-20 DIAGNOSIS — Z6830 Body mass index (BMI) 30.0-30.9, adult: Secondary | ICD-10-CM | POA: Diagnosis not present

## 2024-09-20 DIAGNOSIS — M1991 Primary osteoarthritis, unspecified site: Secondary | ICD-10-CM | POA: Diagnosis not present

## 2024-09-20 DIAGNOSIS — G6289 Other specified polyneuropathies: Secondary | ICD-10-CM | POA: Diagnosis not present

## 2024-09-20 DIAGNOSIS — L409 Psoriasis, unspecified: Secondary | ICD-10-CM | POA: Diagnosis not present

## 2024-09-20 DIAGNOSIS — E669 Obesity, unspecified: Secondary | ICD-10-CM | POA: Diagnosis not present

## 2024-09-25 ENCOUNTER — Ambulatory Visit (INDEPENDENT_AMBULATORY_CARE_PROVIDER_SITE_OTHER): Admitting: Nurse Practitioner

## 2024-09-25 ENCOUNTER — Ambulatory Visit: Payer: Self-pay

## 2024-09-25 ENCOUNTER — Encounter: Payer: Self-pay | Admitting: Nurse Practitioner

## 2024-09-25 VITALS — BP 124/70 | HR 63 | Temp 98.4°F | Ht 70.0 in | Wt 215.4 lb

## 2024-09-25 DIAGNOSIS — J209 Acute bronchitis, unspecified: Secondary | ICD-10-CM | POA: Diagnosis not present

## 2024-09-25 DIAGNOSIS — J069 Acute upper respiratory infection, unspecified: Secondary | ICD-10-CM

## 2024-09-25 DIAGNOSIS — J4521 Mild intermittent asthma with (acute) exacerbation: Secondary | ICD-10-CM

## 2024-09-25 LAB — POCT INFLUENZA A/B
Influenza A, POC: NEGATIVE
Influenza B, POC: NEGATIVE

## 2024-09-25 LAB — POC COVID19 BINAXNOW: SARS Coronavirus 2 Ag: NEGATIVE

## 2024-09-25 MED ORDER — BENZONATATE 100 MG PO CAPS: 100.0000 mg | ORAL_CAPSULE | Freq: Three times a day (TID) | ORAL | 0 refills | Status: DC | PRN

## 2024-09-25 MED ORDER — ALBUTEROL SULFATE HFA 108 (90 BASE) MCG/ACT IN AERS: 2.0000 | INHALATION_SPRAY | Freq: Four times a day (QID) | RESPIRATORY_TRACT | 0 refills | Status: AC | PRN

## 2024-09-25 NOTE — Progress Notes (Signed)
 Acute Office Visit  Subjective:    Patient ID: Martin Rubio, male    DOB: [birth year], 78 y.o.   MRN: [id number]  Chief Complaint  Patient presents with   Cough    Started last Wednesday, deep cough not productive wheezing started this morning     URI  This is a new problem. The current episode started in the past 7 days. The problem has been unchanged. Maximum temperature: chills, unmeasured temperature. Pertinent negatives include no abdominal pain, diarrhea, dysuria, joint pain, joint swelling, nausea, neck pain, plugged ear sensation, swollen glands or vomiting. He has tried antihistamine (mucinex Dm) for the symptoms. The treatment provided mild relief.  Embrel dose administered on Saturday.  Accompanied by wife.  Outpatient Medications Prior to Visit  Medication Sig   alfuzosin  (UROXATRAL ) 10 MG 24 hr tablet Take 1 tablet (10 mg total) by mouth daily with breakfast.   amLODipine  (NORVASC ) 5 MG tablet Take 1 tablet (5 mg total) by mouth daily.   cholecalciferol  (VITAMIN D3) 25 MCG (1000 UNIT) tablet Take 2,000 Units by mouth daily in the afternoon.   Cyanocobalamin  (VITAMIN B 12) 500 MCG TABS Take 1,000 mcg by mouth daily in the afternoon.   diclofenac  Sodium (VOLTAREN ) 1 % GEL Apply 4 g topically in the morning and at bedtime. (Patient taking differently: Apply 4 g topically in the morning and at bedtime.)   dicyclomine  (BENTYL ) 20 MG tablet TAKE ONE TABLET BY MOUTH EVERY 6 HOURS AS NEEDED FOR ABDOMINAL CRAMPING/DIARRHEA   dofetilide  (TIKOSYN ) 250 MCG capsule TAKE ONE CAPSULE BY MOUTH TWICE A DAY   esomeprazole  (NEXIUM ) 40 MG capsule Take 1 capsule (40 mg total) by mouth 2 (two) times daily before a meal. For 4 weeks, then decrease to once a day dose   etanercept (ENBREL SURECLICK) 50 MG/ML injection Per rheumatology   Evolocumab  (REPATHA  SURECLICK) 140 MG/ML SOAJ INJECT 140 MG UNDER THE SKIN EVERY TWO WEEKS INTO THE THIGH, STOMACH, OR UPPER ARM   famotidine  (PEPCID ) 40 MG  tablet Take 1 tablet (40 mg total) by mouth at bedtime.   ferrous fumarate  (FERRETTS) 325 (106 Fe) MG TABS tablet Take 1 tablet (106 mg of iron total) by mouth 2 (two) times daily. (Patient taking differently: Take 1 tablet by mouth daily.)   fluticasone  (FLONASE ) 50 MCG/ACT nasal spray Place 1 spray into both nostrils as needed for allergies.   hydrOXYzine  (VISTARIL ) 25 MG capsule Take 1 capsule (25 mg total) by mouth every 8 (eight) hours as needed.   levothyroxine  (SYNTHROID ) 137 MCG tablet Take 1 tablet (137 mcg total) by mouth daily before breakfast.   losartan  (COZAAR ) 50 MG tablet TAKE ONE TABLET BY MOUTH EVERY MORNING AND TAKE ONE TABLET BY MOUTH AT BEDTIME   meclizine  (ANTIVERT ) 25 MG tablet Take 25 mg by mouth 3 (three) times daily as needed for dizziness.   Melatonin 5 MG TABS Take 5 mg by mouth at bedtime.   methocarbamol  (ROBAXIN ) 500 MG tablet Take 1 tablet (500 mg total) by mouth every 8 (eight) hours as needed for muscle spasms.   rivaroxaban  (XARELTO ) 20 MG TABS tablet Take 1 tablet (20 mg total) by mouth daily with supper.   sildenafil  (REVATIO ) 20 MG tablet Take 1 tablet  by mouth 30-60 minutes before intercourse   tadalafil (CIALIS) 20 MG tablet Take 20 mg by mouth daily as needed.   triamcinolone  cream (KENALOG ) 0.1 % Apply 1 application  topically daily as needed for itching.   vardenafil  (LEVITRA )  20 MG tablet Take 1 tablet (20 mg total) by mouth daily as needed for erectile dysfunction.   [DISCONTINUED] OVER THE COUNTER MEDICATION Take 2 capsules by mouth daily in the afternoon. Nervprin - Healthy Nerve Support - Peripheral Neuropathy Relief (Patient not taking: Reported on 09/25/2024)   [DISCONTINUED] oxybutynin  (DITROPAN -XL) 10 MG 24 hr tablet Take 10 mg by mouth daily. (Patient not taking: Reported on 09/25/2024)   No facility-administered medications prior to visit.    Reviewed past medical and social history.  Review of Systems  Gastrointestinal:  Negative for  abdominal pain, diarrhea, nausea and vomiting.  Genitourinary:  Negative for dysuria.  Musculoskeletal:  Negative for joint pain and neck pain.   Per HPI     Objective:    Physical Exam Vitals and nursing note reviewed.  Constitutional:      General: He is not in acute distress. HENT:     Nose: Congestion and rhinorrhea present. No nasal tenderness or mucosal edema.     Right Nostril: No occlusion.     Left Nostril: No occlusion.     Right Turbinates: Not enlarged, swollen or pale.     Left Turbinates: Not enlarged, swollen or pale.     Right Sinus: No maxillary sinus tenderness or frontal sinus tenderness.     Left Sinus: No maxillary sinus tenderness or frontal sinus tenderness.     Mouth/Throat:     Pharynx: Oropharynx is clear. Uvula midline. Posterior oropharyngeal erythema and postnasal drip present. No pharyngeal swelling, oropharyngeal exudate or uvula swelling.     Tonsils: No tonsillar exudate or tonsillar abscesses.  Eyes:     Extraocular Movements: Extraocular movements intact.     Conjunctiva/sclera: Conjunctivae normal.  Cardiovascular:     Rate and Rhythm: Normal rate and regular rhythm.     Pulses: Normal pulses.     Heart sounds: Murmur heard.  Pulmonary:     Effort: Pulmonary effort is normal.     Breath sounds: Normal breath sounds.  Musculoskeletal:     Cervical back: Normal range of motion and neck supple.  Lymphadenopathy:     Cervical: No cervical adenopathy.  Neurological:     Mental Status: He is alert and oriented to person, place, and time.    BP 124/70 (BP Location: Right Arm, Patient Position: Sitting, Cuff Size: Large)   Pulse 63   Temp 98.4 F (36.9 C) (Oral)   Ht 5' 10 (1.778 m)   Wt 215 lb 6.4 oz (97.7 kg)   SpO2 97%   BMI 30.91 kg/m    Results for orders placed or performed in visit on 09/25/24  POC COVID-19  Result Value Ref Range   SARS Coronavirus 2 Ag Negative Negative  POCT Influenza A/B  Result Value Ref Range    Influenza A, POC Negative Negative   Influenza B, POC Negative Negative       Assessment & Plan:   Problem List Items Addressed This Visit     Acute bronchitis   Relevant Medications   albuterol  (VENTOLIN  HFA) 108 (90 Base) MCG/ACT inhaler   Other Visit Diagnoses       Viral upper respiratory tract infection    -  Primary   Relevant Medications   benzonatate  (TESSALON ) 100 MG capsule   Other Relevant Orders   POC COVID-19 (Completed)   POCT Influenza A/B (Completed)      Meds ordered this encounter  Medications   benzonatate  (TESSALON ) 100 MG capsule    Sig: Take 1-2  capsules (100-200 mg total) by mouth 3 (three) times daily as needed.    Dispense:  21 capsule    Refill:  0    Supervising Provider:   BERNETA FALLOW ALFRED [[id number]]   albuterol  (VENTOLIN  HFA) 108 (90 Base) MCG/ACT inhaler    Sig: Inhale 2 puffs into the lungs every 6 (six) hours as needed.    Dispense:  8 g    Refill:  0    Supervising Provider:   BERNETA FALLOW SAYRE [[id number]]   Call office if no improvement by Thurdays Start flonase  2sprays in each nare daily Encourage adequate oral hydration. Ok to alternate between mucinex Dm and benzonatate  for cough Use albuterol  as needed for wheezing and chest tightness You can use plain Tylenol  for fever, chills and achyness. Use cool mist humidifier at bedtime to help with nasal congestion and cough. Cold/cough medications may have tylenol  or ibuprofen or guaifenesin or dextromethophan in them, so be careful not to take beyond the recommended dose for each of these medications.   Return if symptoms worsen or fail to improve.    Roselie Mood, NP

## 2024-09-25 NOTE — Patient Instructions (Addendum)
 Call office if no improvement by Thurdays Start flonase  2sprays in each nare daily Encourage adequate oral hydration. Ok to alternate between mucinex Dm and benzonatate  for cough Use albuterol  as needed for wheezing and chest tightness You can use plain Tylenol  for fever, chills and achyness. Use cool mist humidifier at bedtime to help with nasal congestion and cough.  Cold/cough medications may have tylenol  or ibuprofen or guaifenesin or dextromethophan in them, so be careful not to take beyond the recommended dose for each of these medications.   Common cold symptoms are usually triggered by a virus.  The antibiotics are usually not necessary. On average, a viral cold illness may take 7-10 days to resolve. Please, make an appointment if you are not better or if you're worse.

## 2024-09-25 NOTE — Telephone Encounter (Signed)
 FYI Only or Action Required?: Action required by provider: request for appointment.  Patient was last seen in primary care on 08/01/2024 by Amon Aloysius BRAVO, MD.  Called Nurse Triage reporting Cough.  Symptoms began several days ago.  Interventions attempted: OTC medications: Mucinex.  Symptoms are: unchanged.  Triage Disposition: See HCP Within 4 Hours (Or PCP Triage)  Patient/caregiver understands and will follow disposition?: Yes     Copied from CRM #[id number]. Topic: Clinical - Red Word Triage >> Sep 25, 2024  8:36 AM Carlyon D wrote: Red Word that prompted transfer to Nurse Triage: Pt has bad cough, possible low grade fever, wheezing in the chest, body aches, chest and stomach have pain from coughing so much. Answer Assessment - Initial Assessment Questions 1. ONSET: When did the cough begin?      3 days  2. SEVERITY: How bad is the cough today?      severe 3. SPUTUM: Describe the color of your sputum (e.g., none, dry cough; clear, white, yellow, green)     clear 4. HEMOPTYSIS: Are you coughing up any blood? If Yes, ask: How much? (e.g., flecks, streaks, tablespoons, etc.)     no 5. DIFFICULTY BREATHING: Are you having difficulty breathing? If Yes, ask: How bad is it? (e.g., mild, moderate, severe)      mild 6. FEVER: Do you have a fever? If Yes, ask: What is your temperature, how was it measured, and when did it start?     no 7. CARDIAC HISTORY: Do you have any history of heart disease? (e.g., heart attack, congestive heart failure)      pacemaker 8. LUNG HISTORY: Do you have any history of lung disease?  (e.g., pulmonary embolus, asthma, emphysema)     no 9. PE RISK FACTORS: Do you have a history of blood clots? (or: recent major surgery, recent prolonged travel, bedridden)     no 10. OTHER SYMPTOMS: Do you have any other symptoms? (e.g., runny nose, wheezing, chest pain)       Runny nose, wheezing 11. PREGNANCY: Is there any chance you are  pregnant? When was your last menstrual period?       N/a 12. TRAVEL: Have you traveled out of the country in the last month? (e.g., travel history, exposures)       no  Protocols used: Cough - Acute Productive-A-AH  Reason for Disposition  [1] MILD difficulty breathing (e.g., minimal/no SOB at rest, SOB with walking, pulse < 100) AND [2] still present when not coughing  Answer Assessment - Initial Assessment Questions 1. ONSET: When did the cough begin?      3 days  2. SEVERITY: How bad is the cough today?      severe 3. SPUTUM: Describe the color of your sputum (e.g., none, dry cough; clear, white, yellow, green)     clear 4. HEMOPTYSIS: Are you coughing up any blood? If Yes, ask: How much? (e.g., flecks, streaks, tablespoons, etc.)     no 5. DIFFICULTY BREATHING: Are you having difficulty breathing? If Yes, ask: How bad is it? (e.g., mild, moderate, severe)      mild 6. FEVER: Do you have a fever? If Yes, ask: What is your temperature, how was it measured, and when did it start?     no 7. CARDIAC HISTORY: Do you have any history of heart disease? (e.g., heart attack, congestive heart failure)      pacemaker 8. LUNG HISTORY: Do you have any history of lung disease?  (e.g., pulmonary  embolus, asthma, emphysema)     no 9. PE RISK FACTORS: Do you have a history of blood clots? (or: recent major surgery, recent prolonged travel, bedridden)     no 10. OTHER SYMPTOMS: Do you have any other symptoms? (e.g., runny nose, wheezing, chest pain)       Runny nose, wheezing 11. PREGNANCY: Is there any chance you are pregnant? When was your last menstrual period?       N/a 12. TRAVEL: Have you traveled out of the country in the last month? (e.g., travel history, exposures)       no  Protocols used: Cough - Acute Productive-A-AH

## 2024-09-27 DIAGNOSIS — G629 Polyneuropathy, unspecified: Secondary | ICD-10-CM | POA: Diagnosis not present

## 2024-09-29 ENCOUNTER — Telehealth: Payer: Self-pay | Admitting: Nurse Practitioner

## 2024-09-29 DIAGNOSIS — J069 Acute upper respiratory infection, unspecified: Secondary | ICD-10-CM

## 2024-09-29 NOTE — Telephone Encounter (Signed)
 Copied from CRM [id number]. Topic: Clinical - Medication Refill >> Sep 29, 2024  2:09 PM Franky GRADE wrote: Medication: benzonatate  (TESSALON ) 100 MG capsule [[id number]]  Has the patient contacted their pharmacy? No (Agent: If no, request that the patient contact the pharmacy for the refill. If patient does not wish to contact the pharmacy document the reason why and proceed with request.) (Agent: If yes, when and what did the pharmacy advise?)  This is the patient's preferred pharmacy:  [id number] [place] - [place],  - [address] [place]. AT [facility] [address] [place]. [place] [address] Phone: [phone] Fax: [phone]  Is this the correct pharmacy for this prescription? Yes If no, delete pharmacy and type the correct one.   Has the prescription been filled recently? Yes, by Roselie Mood at [facility]  Is the patient out of the medication? Yes  Has the patient been seen for an appointment in the last year OR does the patient have an upcoming appointment? Yes  Can we respond through MyChart? Yes  Agent: Please be advised that Rx refills may take up to 3 business days. We ask that you follow-up with your pharmacy.

## 2024-10-03 ENCOUNTER — Other Ambulatory Visit: Payer: Self-pay

## 2024-10-03 MED ORDER — ESOMEPRAZOLE MAGNESIUM 40 MG PO CPDR: 40.0000 mg | DELAYED_RELEASE_CAPSULE | Freq: Every day | ORAL | 6 refills | Status: AC

## 2024-10-09 ENCOUNTER — Encounter: Payer: Self-pay | Admitting: Radiology

## 2024-10-10 ENCOUNTER — Ambulatory Visit: Admitting: Student

## 2024-10-10 DIAGNOSIS — I442 Atrioventricular block, complete: Secondary | ICD-10-CM | POA: Insufficient documentation

## 2024-10-10 DIAGNOSIS — D6869 Other thrombophilia: Secondary | ICD-10-CM | POA: Insufficient documentation

## 2024-10-10 DIAGNOSIS — N1831 Chronic kidney disease, stage 3a: Secondary | ICD-10-CM | POA: Insufficient documentation

## 2024-10-10 NOTE — Progress Notes (Unsigned)
 Cardiology Office Note:   Date:  10/12/2024  ID:  Martin Rubio, DOB August 17, 1946, MRN [id number] PCP: Martin Aloysius BRAVO, MD  [facility] Providers Cardiologist:  Martin Schilling, MD Electrophysiologist:  Eulas Rubio Furbish, MD {  History of Present Illness:   Martin Rubio is a 78 y.o. male for follow up of HTN.  His echo in Nov of 2016 demonstrated no septal hypertrophy.  He has had atrial fib/flutter paroxysmally with slow ventricular rates.  He had pacemaker placement.   He continued to have PAF and was started on Tikosyn .   Angiography showed small to medium size aortic aneurysm above the iliac bifurcation, severe occlusive disease affecting bilateral common iliac artery worse on the left side with large sized aneurysm on the left side and medium size on the right side with very tortuous iliac arteries.  Endovascular options were limited.  The patient was seen by Dr. Lanis for consultation.  Given that his claudication was not severe and aneurysm size was below the threshold for repair, it was elected to continue medical therapy.    He had his gallbladder removed and he is having problems with stomach issues following this.  He is not having any cardiac issues however.  He is not doing as much as he is trying to recover from cholecystectomy.  He has a home elliptical that he does occasionally and does a little stretching. The patient denies any new symptoms such as chest discomfort, neck or arm discomfort. There has been no new shortness of breath, PND or orthopnea. There have been no reported palpitations, presyncope or syncope.   ROS: As stated in the HPI and negative for all other systems.  Studies Reviewed:    EKG:     NA  Risk Assessment/Calculations:    CHA2DS2-VASc Score = 4   This indicates a 4.8% annual risk of stroke. The patient's score is based upon: CHF History: 0 HTN History: 1 Diabetes History: 0 Stroke History: 0 Vascular Disease History: 1 Age Score: 2 Gender  Score: 0   Physical Exam:   VS:  BP 138/73   Pulse 68   Ht 5' 10 (1.778 m)   Wt 218 lb (98.9 kg)   SpO2 95%   BMI 31.28 kg/m    Wt Readings from Last 3 Encounters:  10/12/24 218 lb (98.9 kg)  09/25/24 215 lb 6.4 oz (97.7 kg)  08/01/24 213 lb 6 oz (96.8 kg)     GEN: Well nourished, well developed in no acute distress NECK: No JVD; No carotid bruits CARDIAC: RRR, 2 out of 6 brief apical systolic murmur early peaking and nonradiating, no diastolic murmurs, rubs, gallops RESPIRATORY:  Clear to auscultation without rales, wheezing or rhonchi  ABDOMEN: Soft, non-tender, non-distended EXTREMITIES:  No edema; No deformity   ASSESSMENT AND PLAN:   Atrial fib: He seems to maintaining sinus rhythm.  He and I have discussed the Tikosyn  and he wants to continue with this.  Tolerates anticoagulation.  No change in therapy.  I am going to have him get a basic metabolic profile and magnesium  in a couple of weeks.  He has not been taking his over-the-counter magnesium  as consistently.  He is gena restart this and then we can measure the labs as above.  Secondary Hypercoagulable State: Has had no problems with anticoagulation.  He is up-to-date with blood work.  Continue with meds as listed.  HTN:    The blood pressure is at target.  No change in therapy.  Obstructive sleep apnea:   Compliance with sleep apnea has been encouraged.   CHB: He is up-to-date with follow-up.  I reviewed his most recent interrogation done 09/14/2024   CAD: He is having no unstable symptoms and will continue with risk reduction.    CKD: Creatinine is unchanged at 1.63.  Dyslipidemia: 61 with Repatha .  Continue meds as listed.     Follow up with me in 1 year.  Signed, Martin Schilling, MD

## 2024-10-11 ENCOUNTER — Encounter: Payer: Self-pay | Admitting: Gastroenterology

## 2024-10-11 NOTE — Progress Notes (Unsigned)
 Electrophysiology Office Note:   Date:  10/12/2024  ID:  Martin Rubio, DOB [birth year], MRN [id number]  Primary Cardiologist: Lynwood Schilling, MD Primary Heart Failure: None Electrophysiologist: Eulas FORBES Furbish, MD       History of Present Illness:   Martin Rubio is a 78 y.o. male with h/o paroxysmal AF, SSS s/p PPM, HTN, Hypertrophic Cardiomyopathy, AV sclerosis seen today for routine electrophysiology followup.   Since last being seen in our clinic the patient reports doing largely well.  He has noted episodes of dizziness - he has taken his meclizine  and it helps at times. He has not passed out or felt as thought he might pass out. He is not aware of any AF episodes.  He takes his Tikosyn  on time at 9A/9P.  He has not missed doses.   He denies chest pain, palpitations, dyspnea, PND, orthopnea, nausea, vomiting, dizziness, syncope, edema, weight gain, or early satiety.   Review of systems complete and found to be negative unless listed in HPI.   EP Information / Studies Reviewed:    EKG is ordered today. Personal review as below.  EKG Interpretation Date/Time:  Thursday October 12 2024 15:08:38 EST Ventricular Rate:  79 PR Interval:  320 QRS Duration:  94 QT Interval:  390 QTC Calculation: 447 R Axis:   -38  Text Interpretation: Atrial-paced rhythm with prolonged AV conduction Left axis deviation Confirmed by Aniceto Jarvis ([id number]) on 10/12/2024 3:17:05 PM   PPM Interrogation-  reviewed in detail today,  See PACEART report.  Device History: Medtronic Dual Chamber PPM implanted 12/13/19 for Sinus Node Dysfunction   Arrhythmia / AAD / Pertinent EP Studies Paroxysmal AF > initial dx 09/2009 Tikosyn  > loading admit 02/2020 EPS 12/24/22 > PVI ablation    Risk Assessment/Calculations:    CHA2DS2-VASc Score = 4   This indicates a 4.8% annual risk of stroke. The patient's score is based upon: CHF History: 0 HTN History: 1 Diabetes History: 0 Stroke History: 0 Vascular  Disease History: 1 Age Score: 2 Gender Score: 0             Physical Exam:   VS:  BP 138/73   Pulse 79   Ht 5' 10 (1.778 m)   Wt 218 lb (98.9 kg)   SpO2 95%   BMI 31.28 kg/m    Wt Readings from Last 3 Encounters:  10/12/24 218 lb (98.9 kg)  10/12/24 218 lb (98.9 kg)  09/25/24 215 lb 6.4 oz (97.7 kg)     GEN: Well nourished, well developed in no acute distress NECK: No JVD; No carotid bruits CARDIAC: Regular rate and rhythm, 2/6 systolic murmurs, rubs, gallops. PPM site wnl - no tethering  RESPIRATORY:  Clear to auscultation without rales, wheezing or rhonchi  ABDOMEN: Soft, non-tender, non-distended EXTREMITIES:  No edema; No deformity   ASSESSMENT AND PLAN:    SND s/p Medtronic PPM  -Normal PPM function -See Pace Art report -No changes today  Paroxysmal Atrial Fibrillation  High Risk Medication Monitoring: Tikosyn   CHA2DS2-VASc 4, s/p PVI 12/2022  -EKG with NSR, QTc -<0.1% burden by device  -Tikosyn  250 mcg BID  -update Tikosyn  labs > BMP, Mg+   -OAC for stroke prophylaxis   Secondary Hypercoagulable State  -continue Xarelto  20 mg daily  NSVT  -noted on device, very brief / seconds long episodes (2-3 sec) -monitor burden   Disposition:   Follow up with Dr. Furbish / EP APP in 6 months for Tikosyn  and 1 year for  device follow up.  Signed, Daphne Barrack, NP-C, AGACNP-BC [facility] - Electrophysiology  10/12/2024, 3:17 PM

## 2024-10-12 ENCOUNTER — Encounter: Payer: Self-pay | Admitting: Pulmonary Disease

## 2024-10-12 ENCOUNTER — Ambulatory Visit: Attending: Cardiology | Admitting: Cardiology

## 2024-10-12 ENCOUNTER — Ambulatory Visit (INDEPENDENT_AMBULATORY_CARE_PROVIDER_SITE_OTHER): Admitting: Pulmonary Disease

## 2024-10-12 ENCOUNTER — Encounter: Payer: Self-pay | Admitting: Cardiology

## 2024-10-12 VITALS — BP 138/73 | HR 79 | Ht 70.0 in | Wt 218.0 lb

## 2024-10-12 VITALS — BP 138/73 | HR 68 | Ht 70.0 in | Wt 218.0 lb

## 2024-10-12 DIAGNOSIS — Z950 Presence of cardiac pacemaker: Secondary | ICD-10-CM | POA: Insufficient documentation

## 2024-10-12 DIAGNOSIS — D6869 Other thrombophilia: Secondary | ICD-10-CM | POA: Insufficient documentation

## 2024-10-12 DIAGNOSIS — I480 Paroxysmal atrial fibrillation: Secondary | ICD-10-CM | POA: Insufficient documentation

## 2024-10-12 DIAGNOSIS — I442 Atrioventricular block, complete: Secondary | ICD-10-CM | POA: Insufficient documentation

## 2024-10-12 DIAGNOSIS — N1831 Chronic kidney disease, stage 3a: Secondary | ICD-10-CM | POA: Insufficient documentation

## 2024-10-12 DIAGNOSIS — I495 Sick sinus syndrome: Secondary | ICD-10-CM | POA: Insufficient documentation

## 2024-10-12 DIAGNOSIS — I10 Essential (primary) hypertension: Secondary | ICD-10-CM | POA: Diagnosis not present

## 2024-10-12 LAB — CUP PACEART INCLINIC DEVICE CHECK
Date Time Interrogation Session: 20251106160709
Implantable Lead Connection Status: 753985
Implantable Lead Connection Status: 753985
Implantable Lead Implant Date: 20210106
Implantable Lead Implant Date: 20210106
Implantable Lead Location: 753859
Implantable Lead Location: 753860
Implantable Lead Model: 3830
Implantable Lead Model: 5076
Implantable Pulse Generator Implant Date: 20210106

## 2024-10-12 NOTE — Patient Instructions (Signed)
 Medication Instructions:  Your physician recommends that you continue on your current medications as directed. Please refer to the Current Medication list given to you today.  *If you need a refill on your cardiac medications before your next appointment, please call your pharmacy*  Lab Work: BMET, Magnesium  at [employer] If you have labs (blood work) drawn today and your tests are completely normal, you will receive your results only by: MyChart Message (if you have MyChart) OR A paper copy in the mail If you have any lab test that is abnormal or we need to change your treatment, we will call you to review the results.  Testing/Procedures: NONE  Follow-Up: At [facility], you and your health needs are our priority.  As part of our continuing mission to provide you with exceptional heart care, our providers are all part of one team.  This team includes your primary Cardiologist (physician) and Advanced Practice Providers or APPs (Physician Assistants and Nurse Practitioners) who all work together to provide you with the care you need, when you need it.  Your next appointment:   1 year(s)  Provider:   Lynwood Schilling, MD    We recommend signing up for the patient portal called MyChart.  Sign up information is provided on this After Visit Summary.  MyChart is used to connect with patients for Virtual Visits (Telemedicine).  Patients are able to view lab/test results, encounter notes, upcoming appointments, etc.  Non-urgent messages can be sent to your provider as well.   To learn more about what you can do with MyChart, go to [email or web address].

## 2024-10-12 NOTE — Patient Instructions (Signed)
 Medication Instructions:  Your physician recommends that you continue on your current medications as directed. Please refer to the Current Medication list given to you today.  *If you need a refill on your cardiac medications before your next appointment, please call your pharmacy*  Lab Work: None ordered If you have labs (blood work) drawn today and your tests are completely normal, you will receive your results only by: MyChart Message (if you have MyChart) OR A paper copy in the mail If you have any lab test that is abnormal or we need to change your treatment, we will call you to review the results.  Follow-Up: At [facility], you and your health needs are our priority.  As part of our continuing mission to provide you with exceptional heart care, our providers are all part of one team.  This team includes your primary Cardiologist (physician) and Advanced Practice Providers or APPs (Physician Assistants and Nurse Practitioners) who all work together to provide you with the care you need, when you need it.  Your next appointment:   6 month(s)  Provider:   Marlane Silver, MD or Creighton Doffing, NP

## 2024-10-13 ENCOUNTER — Ambulatory Visit: Payer: Self-pay | Admitting: Cardiology

## 2024-10-13 LAB — BASIC METABOLIC PANEL WITH GFR
BUN/Creatinine Ratio: 9 — ABNORMAL LOW (ref 10–24)
BUN: 15 mg/dL (ref 8–27)
CO2: 21 mmol/L (ref 20–29)
Calcium: 9.5 mg/dL (ref 8.6–10.2)
Chloride: 101 mmol/L (ref 96–106)
Creatinine, Ser: 1.71 mg/dL — ABNORMAL HIGH (ref 0.76–1.27)
Glucose: 84 mg/dL (ref 70–99)
Potassium: 3.9 mmol/L (ref 3.5–5.2)
Sodium: 139 mmol/L (ref 134–144)
eGFR: 40 mL/min/1.73 — ABNORMAL LOW (ref >59)

## 2024-10-13 LAB — MAGNESIUM: Magnesium: 2 mg/dL (ref 1.6–2.3)

## 2024-10-17 NOTE — Telephone Encounter (Signed)
Patient called to follow-up on his test results. 

## 2024-10-19 ENCOUNTER — Telehealth: Payer: Self-pay | Admitting: Cardiovascular Disease

## 2024-10-19 NOTE — Telephone Encounter (Signed)
 Patient stated he is returning a phone call from [facility]. Please advise.

## 2024-10-20 ENCOUNTER — Ambulatory Visit: Admitting: Medical

## 2024-10-20 ENCOUNTER — Ambulatory Visit (INDEPENDENT_AMBULATORY_CARE_PROVIDER_SITE_OTHER)

## 2024-10-20 DIAGNOSIS — E538 Deficiency of other specified B group vitamins: Secondary | ICD-10-CM

## 2024-10-20 MED ORDER — CYANOCOBALAMIN 1000 MCG/ML IJ SOLN
1000.0000 ug | Freq: Once | INTRAMUSCULAR | Status: AC
  Administered 2024-10-20: 1000 ug via INTRAMUSCULAR

## 2024-10-20 NOTE — Telephone Encounter (Signed)
 Spoke with patient, I do not recall calling him however pt said that the voicemail was from a week or so ago. The only thing that I can see that it would have been was possibly regarding his lab results which he states that he did see in his mychart and he did have a question which I was able to answer. Pt will call back should he need anything further.

## 2024-10-20 NOTE — Progress Notes (Signed)
 Pt here for monthly B12 injection per original order dated:   Last B12 injection:09/15/2024  Last B12 level: 04/04/2024   B12 1000mcg given IM left deltoid, and pt tolerated injection well.  Next B12 injection scheduled for: 11/21/2024 @11 :00 AM

## 2024-10-24 ENCOUNTER — Encounter: Payer: Self-pay | Admitting: Podiatry

## 2024-10-24 ENCOUNTER — Ambulatory Visit (INDEPENDENT_AMBULATORY_CARE_PROVIDER_SITE_OTHER): Admitting: Podiatry

## 2024-10-24 DIAGNOSIS — B351 Tinea unguium: Secondary | ICD-10-CM | POA: Diagnosis not present

## 2024-10-24 DIAGNOSIS — N1831 Chronic kidney disease, stage 3a: Secondary | ICD-10-CM | POA: Diagnosis not present

## 2024-10-24 DIAGNOSIS — M79675 Pain in left toe(s): Secondary | ICD-10-CM | POA: Diagnosis not present

## 2024-10-24 DIAGNOSIS — M79674 Pain in right toe(s): Secondary | ICD-10-CM | POA: Diagnosis not present

## 2024-10-24 DIAGNOSIS — G629 Polyneuropathy, unspecified: Secondary | ICD-10-CM

## 2024-10-24 NOTE — Progress Notes (Signed)
 This patient returns to my office for at risk foot care.  This patient requires this care by a professional since this patient will be at risk due to having neuropathy and CKD.  This patient is unable to cut nails himself since the patient cannot reach his nails.These nails are painful walking and wearing shoes.  This patient presents for at risk foot care today.  General Appearance  Alert, conversant and in no acute stress.  Vascular  Dorsalis pedis and posterior tibial  pulses are palpable  bilaterally.  Capillary return is within normal limits  bilaterally. Temperature is within normal limits  bilaterally.  Neurologic  Senn-Weinstein monofilament wire test within normal limits  bilaterally. Muscle power within normal limits bilaterally.  Nails Thick disfigured discolored nails with subungual debris  from hallux to fifth toes bilaterally. No evidence of bacterial infection or drainage bilaterally.  Orthopedic  No limitations of motion  feet .  No crepitus or effusions noted. Hammer toes 2-4  left.  Skin  normotropic skin with no porokeratosis noted bilaterally.  No signs of infections or ulcers noted.   Diffuse callus sub 2,3 left.  Onychomycosis  Pain in right toes  Pain in left toes  Consent was obtained for treatment procedures.   Mechanical debridement of nails 1-5  bilaterally performed with a nail nipper.  Filed with dremel without incident.    Return office visit    3 months                  Told patient to return for periodic foot care and evaluation due to potential at risk complications.   Cordella Bold DPM

## 2024-11-01 ENCOUNTER — Ambulatory Visit: Admitting: Internal Medicine

## 2024-11-05 DIAGNOSIS — G629 Polyneuropathy, unspecified: Secondary | ICD-10-CM | POA: Diagnosis not present

## 2024-11-05 DIAGNOSIS — L4050 Arthropathic psoriasis, unspecified: Secondary | ICD-10-CM | POA: Diagnosis not present

## 2024-11-05 DIAGNOSIS — E785 Hyperlipidemia, unspecified: Secondary | ICD-10-CM | POA: Diagnosis not present

## 2024-11-05 DIAGNOSIS — K219 Gastro-esophageal reflux disease without esophagitis: Secondary | ICD-10-CM | POA: Diagnosis not present

## 2024-11-05 DIAGNOSIS — H9313 Tinnitus, bilateral: Secondary | ICD-10-CM | POA: Diagnosis not present

## 2024-11-05 DIAGNOSIS — E039 Hypothyroidism, unspecified: Secondary | ICD-10-CM | POA: Diagnosis not present

## 2024-11-05 DIAGNOSIS — R490 Dysphonia: Secondary | ICD-10-CM | POA: Diagnosis not present

## 2024-11-05 DIAGNOSIS — R2689 Other abnormalities of gait and mobility: Secondary | ICD-10-CM | POA: Diagnosis not present

## 2024-11-06 ENCOUNTER — Encounter: Payer: Self-pay | Admitting: Pharmacist Clinician (PhC)/ Clinical Pharmacy Specialist

## 2024-11-12 ENCOUNTER — Other Ambulatory Visit: Payer: Self-pay | Admitting: Internal Medicine

## 2024-11-12 ENCOUNTER — Other Ambulatory Visit: Payer: Self-pay | Admitting: Cardiology

## 2024-11-14 ENCOUNTER — Telehealth: Payer: Self-pay | Admitting: Internal Medicine

## 2024-11-14 NOTE — Telephone Encounter (Signed)
 Please advise

## 2024-11-14 NOTE — Telephone Encounter (Signed)
 Copied from CRM #[id number]. Topic: Referral - Request for Referral >> Nov 14, 2024 11:13 AM Eva FALCON wrote: Did the patient discuss referral with their provider in the last year?  Advocate who called on his behalf is unsure. (If No - schedule appointment) (If Yes - send message)  Appointment offered? No, states they were just seen.   Type of order/referral and detailed reason for visit: Nutritionist   Preference of office, provider, location: [facility] in [place].   If referral order, have you been seen by this specialty before? No (If Yes, this issue or another issue? When? Where?  Can we respond through MyChart? Yes

## 2024-11-15 ENCOUNTER — Telehealth: Payer: Self-pay | Admitting: Cardiology

## 2024-11-15 NOTE — Telephone Encounter (Signed)
 Pt would a c/b from Pharmacist Kristin Alvstad please advise

## 2024-11-15 NOTE — Addendum Note (Signed)
 Addended by: Huie Ghuman D on: 11/15/2024 08:02 AM   Modules accepted: Orders

## 2024-11-15 NOTE — Telephone Encounter (Signed)
 Patient identification verified by 2 forms.   Called and spoke to patient  Patient states:  -Received letter saying [employer] grant was updated.  -Wants to know what else is needed from him.  Patient agrees with plan, no questions at this time

## 2024-11-15 NOTE — Telephone Encounter (Signed)
 Nutrition referral placed.

## 2024-11-21 ENCOUNTER — Ambulatory Visit

## 2024-11-21 DIAGNOSIS — E538 Deficiency of other specified B group vitamins: Secondary | ICD-10-CM | POA: Diagnosis not present

## 2024-11-21 MED ORDER — CYANOCOBALAMIN 1000 MCG/ML IJ SOLN
1000.0000 ug | Freq: Once | INTRAMUSCULAR | Status: AC
  Administered 2024-11-21: 11:00:00 1000 ug via INTRAMUSCULAR

## 2024-11-21 NOTE — Progress Notes (Signed)
 Pt here for monthly B12 injection per original order dated: on 04/07/24, per Dr/ Amon I recommend to start B12 shots monthly to see if that helps your neuropathy. Please call the office and arrange for monthly B12 shots by my nurse  Last B12 injection:10/20/24  Last B12 level: 04/04/24   B12 1000mcg given IM, left deltoid and pt tolerated injection well.  Next B12 injection scheduled for: 12/22/24  @ 11:00 AM

## 2024-11-29 NOTE — Telephone Encounter (Signed)
Nothing else needs to be done.

## 2024-12-01 ENCOUNTER — Other Ambulatory Visit: Payer: Self-pay | Admitting: Cardiology

## 2024-12-04 ENCOUNTER — Encounter: Payer: Self-pay | Admitting: Urology

## 2024-12-04 ENCOUNTER — Ambulatory Visit: Admitting: Urology

## 2024-12-04 ENCOUNTER — Other Ambulatory Visit (HOSPITAL_BASED_OUTPATIENT_CLINIC_OR_DEPARTMENT_OTHER): Payer: Self-pay

## 2024-12-04 VITALS — BP 120/70 | HR 78 | Ht 70.0 in | Wt 220.0 lb

## 2024-12-04 DIAGNOSIS — N529 Male erectile dysfunction, unspecified: Secondary | ICD-10-CM | POA: Diagnosis not present

## 2024-12-04 DIAGNOSIS — R3915 Urgency of urination: Secondary | ICD-10-CM | POA: Diagnosis not present

## 2024-12-04 DIAGNOSIS — N451 Epididymitis: Secondary | ICD-10-CM

## 2024-12-04 DIAGNOSIS — Z87898 Personal history of other specified conditions: Secondary | ICD-10-CM

## 2024-12-04 DIAGNOSIS — R350 Frequency of micturition: Secondary | ICD-10-CM | POA: Diagnosis not present

## 2024-12-04 DIAGNOSIS — Z87448 Personal history of other diseases of urinary system: Secondary | ICD-10-CM

## 2024-12-04 DIAGNOSIS — N401 Enlarged prostate with lower urinary tract symptoms: Secondary | ICD-10-CM | POA: Diagnosis not present

## 2024-12-04 DIAGNOSIS — R351 Nocturia: Secondary | ICD-10-CM

## 2024-12-04 DIAGNOSIS — N138 Other obstructive and reflux uropathy: Secondary | ICD-10-CM | POA: Diagnosis not present

## 2024-12-04 LAB — URINALYSIS, ROUTINE W REFLEX MICROSCOPIC
Bilirubin, UA: NEGATIVE
Glucose, UA: NEGATIVE
Ketones, UA: NEGATIVE
Leukocytes,UA: NEGATIVE
Nitrite, UA: NEGATIVE
Specific Gravity, UA: 1.01 (ref 1.005–1.030)
Urobilinogen, Ur: 0.2 mg/dL (ref 0.2–1.0)
pH, UA: 5.5 (ref 5.0–7.5)

## 2024-12-04 LAB — MICROSCOPIC EXAMINATION

## 2024-12-04 MED ORDER — FLUZONE HIGH-DOSE 0.5 ML IM SUSY
0.5000 mL | PREFILLED_SYRINGE | Freq: Once | INTRAMUSCULAR | 0 refills | Status: AC
  Filled 2024-12-04: qty 0.5, 1d supply, fill #0

## 2024-12-04 MED ORDER — DOXYCYCLINE HYCLATE 100 MG PO CAPS: 100.0000 mg | ORAL_CAPSULE | Freq: Two times a day (BID) | ORAL | 0 refills | Status: AC

## 2024-12-04 NOTE — Progress Notes (Signed)
 "  Assessment: 1. Epididymitis, right   2. BPH with obstruction/lower urinary tract symptoms   3. History of urinary retention   4. Organic impotence     Plan: Diagnosis and management of epididymitis discussed with the patient today. Recommend a course of antibiotics -doxycycline  100 mg twice daily x 10 days.  Prescription sent. Continue alfuzosin  10 mg daily  Continue vardenafil  20 mg prn.  He would like to continue to use sildenafil  20 mg as needed along with the vardenafil .  I advised caution with combining medications but with this low dose this is probably reasonable. Return to office in 6 weeks  Chief Complaint:  Chief Complaint  Patient presents with   Benign Prostatic Hypertrophy    History of Present Illness:  Martin Rubio is a 78 y.o. male who is seen for continued evaluation of BPH with obstruction, lower urinary tract symptoms, and history of urinary retention. He was previously followed at [facility] in [facility].   Urologic History: History of BPH with obstruction.  He is status post a greenlight PVP on 06/01/2013.  His urinary symptoms improved significantly following the procedure.  Cystoscopy in September 2014 showed a widely patent prostatic urethra.  At his visit in 1/22, he noted some slight increase in his urinary symptoms with decreased dream, urgency, and nocturia.  He was not having any dysuria or gross hematuria. IPSS = 14.  PSA results: 11/18 1.37 11/19 1.80 12/20 0.93 1/22 1.48  He has history of erectile dysfunction.  He has previously used tadalafil 5 mg daily as well as generic sildenafil .  He reported some flushing with 60 mg of sildenafil .  He has most recently been using tadalafil 20 mg as needed.  He has been managed with tamsulosin  with fairly good control of his urinary symptoms.  He was given a trial of Myrbetriq in April 2023.  He was changed to oxybutynin  XL 10 mg daily due to cost of Myrbetriq.  He was referred to  nephrology for chronic kidney disease.  CT imaging from 2023 showed mild renal atrophy without hydronephrosis.  Renal ultrasound in July 2023 showed bilateral renal calculi.  KUB from 7/23 showed no obvious stones.  He had an episode of urinary retention following laparoscopic cholecystectomy on 09/18/2023.  A Foley catheter was left in place and he was taking tamsulosin .  His catheter was removed after a voiding trial on 09/27/2023. He was seen in urgent care on 09/30/2023 with dysuria.  Urinalysis was unremarkable. He was voiding spontaneously after the catheter was removed.  The dysuria improved.  He continued with urgency and some hesitancy.  No gross hematuria or flank pain.  He continued on tamsulosin  and oxybutynin  XL. IPSS = 13. PVR = 90 ml.  At his visit in November 2024, he continued on tamsulosin .  He reported voiding with a good stream.  No dysuria.  His main complaint was nocturia 1-2 times with urgency, primarily at night. IPSS = 12. PVR = 0 ml He was given a trial of trospium  20 mg nightly.  At his visit in January 2025, he continued on tamsulosin .  He was taking the trospium  20 mg at night but had not seen any significant change in his symptoms.  He continued to have frequency, urgency, slowing of his stream. IPSS = 14 QOL = 4/6. PVR = 75 mL. The trospium  was discontinued and he was given samples of Gemtesa .  He was also changed from tamsulosin  to alfuzosin .  At his visit in  February 2025, he continued on alfuzosin  and Gemtesa  with some improvement in his frequency, urgency, and nocturia with the new combination.  No side effects.  No dysuria or gross hematuria. IPSS = 12.  At his visit in March 2024, he continued on alfuzosin .  He had discontinued the Gemtesa  due to cost.  He reported urgency, frequency, and nocturia x 2.  He felt like his urinary symptoms were slightly improved despite being off of the Gemtesa . IPSS = 14/4. He continues to have problems with erectile  dysfunction.  No benefit with tadalafil.  He was given a trial of vardenafil  20 mg as needed.  At his visit in June 2025, he continued on alfuzosin .  His lower urinary tract symptoms remained fairly stable.  He continued to have some frequency, urgency, and nocturia.  No dysuria or gross hematuria. IPSS = 13/4. He had noted improvement in his erectile function with the vardenafil .  He had used 20 mg of sildenafil  along with this with improved results.  No side effects.  He returns today for follow-up.  He continues on alfuzosin .  He has noted some increase in his lower urinary tract symptoms.  He reports increased nocturia, frequency, and urgency.  He also reports some episodes of urge incontinence.  No dysuria or gross hematuria. IPSS = 14/4. He has a 2-week history of discomfort and a mass in the right scrotum.  No obvious scrotal erythema or edema.  No history of scrotal trauma.  Portions of the above documentation were copied from a prior visit for review purposes only.   Past Medical History:  Past Medical History:  Diagnosis Date   Allergy    Bronchitis    Cataract    Chronic kidney disease    Diverticulosis    FH: BPH (benign prostatic hypertrophy)    GERD (gastroesophageal reflux disease)    History of Graves' disease 01/19/2018   S/p ablation   HTN (hypertension)    Hyperlipidemia    Neuromuscular disorder (HCC)    Obesity    Psoriatic arthritis (HCC)    Rheumatoid arthritis (HCC) 08/07/2014   Sleep apnea    CPAP   Thyroid  disease     Past Surgical History:  Past Surgical History:  Procedure Laterality Date   ABDOMINAL AORTOGRAM W/LOWER EXTREMITY N/A 01/14/2022   Procedure: ABDOMINAL AORTOGRAM W/LOWER EXTREMITY;  Surgeon: Darron Deatrice LABOR, MD;  Location: [facility];  Service: Cardiovascular;  Laterality: N/A;   ATRIAL FIBRILLATION ABLATION N/A 12/24/2022   Procedure: ATRIAL FIBRILLATION ABLATION;  Surgeon: Nancey Eulas BRAVO, MD;  Location: [facility];   Service: Cardiovascular;  Laterality: N/A;   CHOLECYSTECTOMY N/A 09/18/2023   Procedure: LAPAROSCOPIC CHOLECYSTECTOMY WITH INTRAOPERATIVE CHOLANGIOGRAM;  Surgeon: Kinsinger, Herlene Righter, MD;  Location: [facility] OR;  Service: General;  Laterality: N/A;   INGUINAL HERNIA REPAIR     bilateral   KNEE SURGERY     bilateral arthroscopic   l foot surgery Left 12/2020   PACEMAKER IMPLANT N/A 12/13/2019   Procedure: PACEMAKER IMPLANT;  Surgeon: Waddell Danelle ORN, MD;  Location: [facility];  Service: Cardiovascular;  Laterality: N/A;   TRANSURETHRAL RESECTION OF PROSTATE     UMBILICAL HERNIA REPAIR      Allergies:  Allergies  Allergen Reactions   Codeine Other (See Comments)    Tension, nasty feeling    Nsaids Other (See Comments)    Renal insufficiency   Prednisone Other (See Comments)    Unknown/ sometimes takes with lower dose  Statins Other (See Comments)    Joint pain    Sulfa Antibiotics Other (See Comments)    Joint pain    Family History:  Family History  Problem Relation Age of Onset   CAD Mother 34       CABG   CAD Father 36       Died age 47   Diabetes Brother    Lung cancer Maternal Grandmother    Prostate cancer Neg Hx    Colon cancer Neg Hx    Esophageal cancer Neg Hx    Rectal cancer Neg Hx    Stomach cancer Neg Hx     Social History:  Social History   Tobacco Use   Smoking status: Former    Types: Cigarettes   Smokeless tobacco: Never   Tobacco comments:    Former smoker Quit 46 years ago. 01/21/23  Vaping Use   Vaping status: Never Used  Substance Use Topics   Alcohol use: No   Drug use: No    ROS: Constitutional:  Negative for fever, chills, weight loss CV: Negative for chest pain, previous MI, hypertension Respiratory:  Negative for shortness of breath, wheezing, sleep apnea, frequent cough GI:  Negative for nausea, vomiting, bloody stool, GERD  Physical exam: BP 120/70   Pulse 78   Ht 5' 10 (1.778 m)   Wt 220 lb (99.8 kg)   BMI  31.57 kg/m  GENERAL APPEARANCE:  Well appearing, well developed, well nourished, NAD HEENT:  Atraumatic, normocephalic, oropharynx clear NECK:  Supple without lymphadenopathy or thyromegaly ABDOMEN:  Soft, non-tender, no masses EXTREMITIES:  Moves all extremities well, without clubbing, cyanosis, or edema NEUROLOGIC:  Alert and oriented x 3, normal gait, CN II-XII grossly intact MENTAL STATUS:  appropriate BACK:  Non-tender to palpation, No CVAT SKIN:  Warm, dry, and intact GU: Scrotum: no erythema or edema Testis: NT, no masses Epididymis: tenderness: right Prostate: 40 g, NT, no nodules Rectum: Normal tone,  no masses or tenderness   Results: U/A:-5 WBCs, 0-2 RBCs, few bacteria "

## 2024-12-05 ENCOUNTER — Encounter: Admitting: Skilled Nursing Facility1

## 2024-12-05 ENCOUNTER — Encounter: Payer: Self-pay | Admitting: Skilled Nursing Facility1

## 2024-12-05 DIAGNOSIS — N1832 Chronic kidney disease, stage 3b: Secondary | ICD-10-CM | POA: Insufficient documentation

## 2024-12-05 NOTE — Progress Notes (Unsigned)
 Medical Nutrition Therapy  Appointment Start time:  3:11  Appointment End time:  4:15  Primary concerns today: stopping diarrhea  Referral diagnosis: N18.4   NUTRITION ASSESSMENT    Clinical Medical Hx: CKD stage 3, OSA, diverticulosis, hyperlipidemia  Medications: see list Labs: creatinine 1.71, eGFR 40, BUN/creatinine ratio 9 Notable Signs/Symptoms: none reported   Lifestyle & Dietary Hx  Pt arrives with his supportive wife.  Pt states she has had diarrhea about 2-3 days a week.   Estimated daily fluid intake:  oz Supplements:  Sleep:  Stress / self-care: low stress level Current average weekly physical activity: ADL's  24-Hr Dietary Recall First Meal: cereal or eggs + cheese + toast or jimmy deans breakfast cresaint  Snack:  Second Meal: salad or hamburger Snack:  Third Meal: popcorn or chips  Snack:  Beverages: water , teas, lattes    NUTRITION INTERVENTION  Nutrition education (E-1) on the following topics:   Educated pt on CKD management through nutrition education  Creation of balanced and diverse meals to increase the intake of nutrient-rich foods that provide essential vitamins, minerals, fiber, and phytonutrients Variety of Fruits and Vegetables: Aim for a colorful array of fruits and vegetables to ensure a wide range of nutrients. Include a mix of leafy greens, berries, citrus fruits, cruciferous vegetables, and more. Whole Grains: Choose whole grains over refined grains. Examples include brown rice, quinoa, oats, whole wheat, and barley. Lean Proteins: Include lean sources of protein, such as poultry, fish, tofu, legumes, beans, lentils, and low-fat dairy products. Limit red and processed meats. Healthy Fats: Incorporate sources of healthy fats, including avocados, nuts, seeds, and olive oil. Limit saturated and trans fats found in fried and processed foods. Dairy or Dairy Alternatives: Choose low-fat or fat-free dairy products, or plant-based  alternatives like almond or soy milk. Portion Control: Be mindful of portion sizes to avoid overeating. Pay attention to hunger and satisfaction cues. Limit Added Sugars: Minimize the consumption of sugary beverages, snacks, and desserts. Check food labels for added sugars and opt for natural sources of sweetness such as whole fruits. Hydration: Drink plenty of water  throughout the day. Limit sugary drinks and excessive caffeine intake. Moderate Sodium Intake: Reduce the consumption of high-sodium foods. Use herbs and spices for flavor instead of excessive salt. Meal Planning and Preparation: Plan and prepare meals ahead of time to make healthier choices more convenient. Include a mix of food groups in each meal. Limit Processed Foods: Minimize the intake of highly processed and packaged foods that are often high in added sugars, salt, and unhealthy fats. Regular Physical Activity: Combine a healthy diet with regular physical activity for overall well-being. Aim for at least 150 minutes of moderate-intensity aerobic exercise per week, along with strength training. Moderation and Balance: Enjoy treats and indulgent foods in moderation, emphasizing balance rather than strict restriction.  Handouts Provided Include  Detailed MyPlate  Learning Style & Readiness for Change Teaching method utilized: Visual & Auditory  Demonstrated degree of understanding via: Teach Back  Barriers to learning/adherence to lifestyle change: unidentified   Goals Established by Pt Tea or coffee Lattes: reduce creamer and choose a low fat version; 2 Tablespoons creamer and then add in some 2% milk Watch your choose do a pinch a garnish after the food is cooked Try nutritional Yeast in your eggs When buttering toast cut that 1T so half a T for each slice Get out of the doughnut phase One ice cream a day for your ice cream serving if doing  the [place] limit to  cu Have diet dr pepper every other day and the  regular dr pepper in between  Try stopping eating 3 hours before bed and stop drinking 2 hours before bed    MONITORING & EVALUATION Dietary intake, weekly physical activity  Next Steps  Patient is to return in 2 months.

## 2024-12-13 ENCOUNTER — Ambulatory Visit

## 2024-12-13 DIAGNOSIS — I495 Sick sinus syndrome: Secondary | ICD-10-CM | POA: Diagnosis not present

## 2024-12-14 ENCOUNTER — Ambulatory Visit: Payer: Self-pay | Admitting: Cardiology

## 2024-12-14 LAB — CUP PACEART REMOTE DEVICE CHECK
Battery Remaining Longevity: 101 mo
Battery Voltage: 2.99 V
Brady Statistic AP VP Percent: 4.34 %
Brady Statistic AP VS Percent: 83.38 %
Brady Statistic AS VP Percent: 0.04 %
Brady Statistic AS VS Percent: 12.24 %
Brady Statistic RA Percent Paced: 87.6 %
Brady Statistic RV Percent Paced: 4.37 %
Date Time Interrogation Session: 20260107191433
Implantable Lead Connection Status: 753985
Implantable Lead Connection Status: 753985
Implantable Lead Implant Date: 20210106
Implantable Lead Implant Date: 20210106
Implantable Lead Location: 753859
Implantable Lead Location: 753860
Implantable Lead Model: 3830
Implantable Lead Model: 5076
Implantable Pulse Generator Implant Date: 20210106
Lead Channel Impedance Value: 323 Ohm
Lead Channel Impedance Value: 361 Ohm
Lead Channel Impedance Value: 380 Ohm
Lead Channel Impedance Value: 513 Ohm
Lead Channel Pacing Threshold Amplitude: 0.5 V
Lead Channel Pacing Threshold Amplitude: 0.625 V
Lead Channel Pacing Threshold Pulse Width: 0.4 ms
Lead Channel Pacing Threshold Pulse Width: 0.4 ms
Lead Channel Sensing Intrinsic Amplitude: 1.25 mV
Lead Channel Sensing Intrinsic Amplitude: 1.25 mV
Lead Channel Sensing Intrinsic Amplitude: 23.375 mV
Lead Channel Sensing Intrinsic Amplitude: 23.375 mV
Lead Channel Setting Pacing Amplitude: 1.5 V
Lead Channel Setting Pacing Amplitude: 2.5 V
Lead Channel Setting Pacing Pulse Width: 0.4 ms
Lead Channel Setting Sensing Sensitivity: 2 mV
Zone Setting Status: 755011
Zone Setting Status: 755011

## 2024-12-18 ENCOUNTER — Other Ambulatory Visit: Payer: Self-pay | Admitting: Urology

## 2024-12-18 DIAGNOSIS — N138 Other obstructive and reflux uropathy: Secondary | ICD-10-CM

## 2024-12-18 NOTE — Progress Notes (Signed)
 Remote PPM Transmission

## 2024-12-22 ENCOUNTER — Ambulatory Visit

## 2024-12-22 ENCOUNTER — Telehealth: Payer: Self-pay | Admitting: Pharmacy Technician

## 2024-12-22 DIAGNOSIS — E538 Deficiency of other specified B group vitamins: Secondary | ICD-10-CM

## 2024-12-22 MED ORDER — CYANOCOBALAMIN 1000 MCG/ML IJ SOLN
1000.0000 ug | Freq: Once | INTRAMUSCULAR | Status: AC
  Administered 2024-12-22: 1000 ug via INTRAMUSCULAR

## 2024-12-22 NOTE — Telephone Encounter (Signed)
 Pharmacy Patient Advocate Encounter   Received notification from CoverMyMeds that prior authorization for repatha  is required/requested.   Insurance verification completed.   The patient is insured through [employer].   Per test claim: PA required; PA submitted to above mentioned insurance via Latent Key/confirmation #/EOC [id number] Status is pending

## 2024-12-22 NOTE — Progress Notes (Signed)
 Pt here for monthly B12 injection per Paz, Jose E, MD   B12 1000mcg given IM left deltoid and pt tolerated injection well.  Next B12 injection scheduled for monthly

## 2024-12-22 NOTE — Telephone Encounter (Signed)
 Pharmacy Patient Advocate Encounter  Received notification from [employer] that Prior Authorization for repatha  has been APPROVED from 09/06/24 to 12/06/25   PA #/Case ID/Reference #: [id number]  [employer] renewed the original

## 2024-12-25 ENCOUNTER — Telehealth: Payer: Self-pay | Admitting: Cardiology

## 2024-12-25 NOTE — Telephone Encounter (Signed)
" °*  STAT* If patient is at the pharmacy, call can be transferred to refill team.   1. Which medications need to be refilled? (please list name of each medication and dose if known)   rivaroxaban  (XARELTO ) 20 MG TABS tablet     2. Would you like to learn more about the convenience, safety, & potential cost savings by using the [facility]? No    3. Are you open to using the [facility] (Type [facility]. ). No    4. Which pharmacy/location (including street and city if local pharmacy) is medication to be sent to? [facility] [address] [place], [place] - [address] AT [facility] [address]     5. Do they need a 30 day or 90 day supply? 90  Pt is currently out   "

## 2024-12-26 ENCOUNTER — Telehealth: Payer: Self-pay

## 2024-12-26 NOTE — Telephone Encounter (Signed)
 Prescription refill request for Xarelto  received.  Indication:afib Last office visit:11/25 Weight:99.8  kg Age:79 Scr:1.71 CrCl:48.64  ml/min  Under review

## 2024-12-27 ENCOUNTER — Other Ambulatory Visit: Payer: Self-pay

## 2024-12-27 DIAGNOSIS — I480 Paroxysmal atrial fibrillation: Secondary | ICD-10-CM

## 2024-12-27 MED ORDER — RIVAROXABAN 15 MG PO TABS: 15.0000 mg | ORAL_TABLET | Freq: Every day | ORAL | 5 refills | Status: DC

## 2024-12-27 NOTE — Telephone Encounter (Signed)
 Rx already responded

## 2024-12-27 NOTE — Telephone Encounter (Signed)
 SCR continues to increase. Lets reduce to 15mg  once daily in the evening

## 2024-12-28 ENCOUNTER — Encounter: Payer: Self-pay | Admitting: Cardiology

## 2024-12-28 DIAGNOSIS — I480 Paroxysmal atrial fibrillation: Secondary | ICD-10-CM

## 2024-12-29 MED ORDER — RIVAROXABAN 15 MG PO TABS: 15.0000 mg | ORAL_TABLET | Freq: Every day | ORAL | 5 refills | Status: AC

## 2025-01-02 NOTE — Progress Notes (Unsigned)
 "  Chief Complaint:LUQ pain Primary GI Doctor:Dr. San  HPI: 79 y.o. male with a past medical history of complete heart block s/p pacemaker placement in 2021, A-fib s/p ablation 12/24/2022 (on xarelto ), HTN, HLD, PUD, Graves' disease, OSA (on CPAP), rheumatoid arthritis, psoriatic arthritis, BPH with LUTS, CKD 3, who follows in the GI clinic for LUQ pain.  Previous followed at [facility]. - 06/24/2018: EGD: Normal esophagus with benign esophageal biopsies - 09/01/2021: Evaluation for IDA and recommended EGD and colonoscopy.  Reports not available for review - 03/25/2022: VCE: Normal   - 09/16/2023-09/19/2023 patient admitted to the hospital for acute gallstone pancreatitis.  Patient initially had a CT with no evidence of cholecystitis, LFTs elevated initially but improved.  Seen by general surgery and underwent lap cholecystectomy with IOC with no evidence of choledocholithiasis.  Did well postoperatively and was cleared for discharge. - 09/22/2023 office visit with PCP for generalized abdominal pain, had been on Oxycodone  and had not had a bowel movement since before his surgery.  Then had trouble urinating.  Patient had a DRE with abundant stools.  Postop patient had urinary retention from fecal impaction and possibly early SBO.  Patient sent to the ER. - 09/22/2023 ER visit with platelets of 134, creatinine 1.54.  Bladder scan revealed 400 cc of urine.  Patient had a In-N-Out catheter placed.  Patient started having bowel movements.  Abdominal pain resolved. - 09/27/2023 patient seen by urology who did a bladder catheterization for benign prostatic hyperplasia with urinary retention.  Foley was then removed.  Patient told to take Flomax . - 09/28/2023 office visit with Delon Failing, PA-C for reflux and to establish care, continued on pantoprazole  40 mg daily, Pepcid  at night and as needed. - 01/13/2024 RUQ US   for abdominal pain postcholecystectomy showed increased hepatic  echotexture most likely steatosis surgically absent gallbladder no biliary dilation. -01/25/2024 follow-up visit with Alan Coombs, PA-C.  Had been having diarrhea since his cholecystectomy, treating with OTC antidiarrheal medications, fiber supplement, and dicyclomine .  Was also having RUQ pain and GERD.  Recommended OTC lidocaine  patches for pain, continued pantoprazole , added Pepcid  at nighttime --05/04/24 follow-up with Dr. San for reflux and dyspepsia. RUQ pain has since resolved. No lower GI symptoms. EGD scheduled/ --06/07/24 EGD: normal esophagus, z line regular, A few gastric polyps. Normal mucosa in stomach. Normal examined duodenum. Biopsies negative of small intestine, stomach, and gastric polyps.  Interval History Patient last seen in GI office on 05/04/24 by Dr. San for abdominal pain, GERD.  Patient presents for evaluation of chronic intermittent abdominal pain for over a year now.  Patient reports he has regular BM most days. Denies any known food triggers. Patient admits his diet Cheng Dec not be the best. He eats ice cream before bedtime. He eats a lot of BLT sandwiches, pork loins, and red meat. The pain Spenser Harren be on the RUQ and at other times on the LUQ. He reports the dicyclomine  prescribed at last appt helps with diarrhea and abdominal cramping, but causes constipation so he takes it sparingly. Denies flatulence or bloating. He has been taking OTC probiotics he thinks Euline Kimbler have expired due to odor they have. He reports the probiotics help keep him regular. He does mention he would like to lose weight, but unable to on own. He would like to discuss with his PCP about weight loss drugs to help decrease his appetite.   Patient takes the esomeprazole  40 mg prn for reflux issues.  Patient taking OTC Mylanta and  ginger chews which helps with the epigastric pain/dyspepsia prn.  We reviewed his endoscopic results today.  Wt Readings from Last 3 Encounters:  01/03/25 218 lb (98.9 kg)   12/04/24 220 lb (99.8 kg)  10/12/24 218 lb (98.9 kg)   Past Medical History:  Diagnosis Date   Allergy    Bronchitis    Cataract    Chronic kidney disease    Diverticulosis    FH: BPH (benign prostatic hypertrophy)    GERD (gastroesophageal reflux disease)    History of Graves' disease 01/19/2018   S/p ablation   HTN (hypertension)    Hyperlipidemia    Neuromuscular disorder (HCC)    Obesity    Psoriatic arthritis (HCC)    Rheumatoid arthritis (HCC) 08/07/2014   Sleep apnea    CPAP   Thyroid  disease     Past Surgical History:  Procedure Laterality Date   ABDOMINAL AORTOGRAM W/LOWER EXTREMITY N/A 01/14/2022   Procedure: ABDOMINAL AORTOGRAM W/LOWER EXTREMITY;  Surgeon: Darron Deatrice LABOR, MD;  Location: [facility];  Service: Cardiovascular;  Laterality: N/A;   ATRIAL FIBRILLATION ABLATION N/A 12/24/2022   Procedure: ATRIAL FIBRILLATION ABLATION;  Surgeon: Nancey Eulas BRAVO, MD;  Location: [facility];  Service: Cardiovascular;  Laterality: N/A;   CHOLECYSTECTOMY N/A 09/18/2023   Procedure: LAPAROSCOPIC CHOLECYSTECTOMY WITH INTRAOPERATIVE CHOLANGIOGRAM;  Surgeon: Kinsinger, Herlene Righter, MD;  Location: [facility] OR;  Service: General;  Laterality: N/A;   INGUINAL HERNIA REPAIR     bilateral   KNEE SURGERY     bilateral arthroscopic   l foot surgery Left 12/2020   PACEMAKER IMPLANT N/A 12/13/2019   Procedure: PACEMAKER IMPLANT;  Surgeon: Waddell Danelle ORN, MD;  Location: [facility];  Service: Cardiovascular;  Laterality: N/A;   TRANSURETHRAL RESECTION OF PROSTATE     UMBILICAL HERNIA REPAIR      Current Outpatient Medications  Medication Sig Dispense Refill   albuterol  (VENTOLIN  HFA) 108 (90 Base) MCG/ACT inhaler Inhale 2 puffs into the lungs every 6 (six) hours as needed. 8 g 0   alfuzosin  (UROXATRAL ) 10 MG 24 hr tablet TAKE ONE TABLET BY MOUTH ONE TIME DAILY WITH BREAKFAST 30 tablet 11   amLODipine  (NORVASC ) 5 MG tablet TAKE ONE TABLET BY MOUTH ONE TIME DAILY  90 tablet 3   cholecalciferol  (VITAMIN D3) 25 MCG (1000 UNIT) tablet Take 2,000 Units by mouth daily in the afternoon.     Cyanocobalamin  (VITAMIN B 12) 500 MCG TABS Take 1,000 mcg by mouth daily in the afternoon.     diclofenac  Sodium (VOLTAREN ) 1 % GEL Apply 4 g topically in the morning and at bedtime. 100 g 6   dofetilide  (TIKOSYN ) 250 MCG capsule TAKE ONE CAPSULE BY MOUTH TWICE A DAY 180 capsule 2   esomeprazole  (NEXIUM ) 40 MG capsule Take 1 capsule (40 mg total) by mouth daily. 30 capsule 6   etanercept (ENBREL SURECLICK) 50 MG/ML injection Per rheumatology     Evolocumab  (REPATHA  SURECLICK) 140 MG/ML SOAJ INJECT 140 MG UNDER THE SKIN EVERY TWO WEEKS INTO THE THIGH, STOMACH, OR UPPER ARM 2 mL 11   famotidine  (PEPCID ) 40 MG tablet Take 1 tablet (40 mg total) by mouth at bedtime. 90 tablet 2   ferrous fumarate  (FERRETTS) 325 (106 Fe) MG TABS tablet Take 1 tablet (106 mg of iron total) by mouth 2 (two) times daily. (Patient taking differently: Take 1 tablet by mouth daily.) 180 tablet 1   fluticasone  (FLONASE ) 50 MCG/ACT nasal spray Place 1 spray into  both nostrils as needed for allergies.     hydrOXYzine  (VISTARIL ) 25 MG capsule Take 1 capsule (25 mg total) by mouth every 8 (eight) hours as needed. 60 capsule 3   hyoscyamine  (LEVSIN  SL) 0.125 MG SL tablet Place 1 tablet (0.125 mg total) under the tongue 3 (three) times daily as needed for cramping. 30 tablet 0   ketoconazole (NIZORAL) 2 % shampoo Apply topically.     levothyroxine  (SYNTHROID ) 137 MCG tablet Take 1 tablet (137 mcg total) by mouth daily before breakfast. 90 tablet 1   losartan  (COZAAR ) 50 MG tablet TAKE ONE TABLET BY MOUTH EVERY MORNING AND TAKE ONE TABLET AT BEDTIME 180 tablet 3   meclizine  (ANTIVERT ) 25 MG tablet Take 25 mg by mouth 3 (three) times daily as needed for dizziness.     Melatonin 5 MG TABS Take 5 mg by mouth at bedtime.     methocarbamol  (ROBAXIN ) 500 MG tablet TAKE ONE TABLET BY MOUTH EVERY 8 HOURS AS NEEDED FOR  MUSCLE SPASM 21 tablet 0   pregabalin  (LYRICA ) 50 MG capsule 1 capsule.     Rivaroxaban  (XARELTO ) 15 MG TABS tablet Take 1 tablet (15 mg total) by mouth daily with supper. 30 tablet 5   secukinumab (COSENTYX) SOLN 1.75mg /kg Intravenous every 4 weeks     sildenafil  (REVATIO ) 20 MG tablet Take 1 tablet  by mouth 30-60 minutes before intercourse 10 tablet 11   tadalafil (CIALIS) 20 MG tablet Take 20 mg by mouth daily as needed.     triamcinolone  cream (KENALOG ) 0.1 % Apply 1 application  topically daily as needed for itching.     vardenafil  (LEVITRA ) 20 MG tablet Take 1 tablet (20 mg total) by mouth daily as needed for erectile dysfunction. 10 tablet 11   No current facility-administered medications for this visit.    Allergies as of 01/03/2025 - Review Complete 01/03/2025  Allergen Reaction Noted   Codeine Other (See Comments) 09/07/2013   Nsaids Other (See Comments) 01/21/2018   Prednisone Other (See Comments) 01/27/2016   Statins Other (See Comments) 06/09/2011   Sulfa antibiotics Other (See Comments) 05/15/2014    Family History  Problem Relation Age of Onset   CAD Mother 64       CABG   CAD Father 51       Died age 79   Diabetes Brother    Lung cancer Maternal Grandmother    Prostate cancer Neg Hx    Colon cancer Neg Hx    Esophageal cancer Neg Hx    Rectal cancer Neg Hx    Stomach cancer Neg Hx     Review of Systems:    Constitutional: No weight loss, fever, chills, weakness or fatigue HEENT: Eyes: No change in vision               Ears, Nose, Throat:  No change in hearing or congestion Skin: No rash or itching Cardiovascular: No chest pain, chest pressure or palpitations   Respiratory: No SOB or cough Gastrointestinal: See HPI and otherwise negative Genitourinary: No dysuria or change in urinary frequency Neurological: No headache, dizziness or syncope Musculoskeletal: No new muscle or joint pain Hematologic: No bleeding or bruising Psychiatric: No history of  depression or anxiety    Physical Exam:  Vital signs: BP 122/68   Pulse 74   Ht 5' 10 (1.778 m)   Wt 218 lb (98.9 kg)   BMI 31.28 kg/m   Constitutional:   Pleasant male appears to be in NAD, Well  developed, Well nourished, alert and cooperative Eyes:   PEERL, EOMI. No icterus. Conjunctiva pink. Neck:  Supple Throat: Oral cavity and pharynx without inflammation, swelling or lesion.  Respiratory: Respirations even and unlabored. Lungs clear to auscultation bilaterally.   No wheezes, crackles, or rhonchi.  Cardiovascular: Normal S1, S2. Regular rate and rhythm. No peripheral edema, cyanosis or pallor.  Gastrointestinal:  Soft, obese, nondistended, LUQ abdominal tenderness with palpation. No rebound or guarding. Normal bowel sounds. No appreciable masses or hepatomegaly. Rectal:  Not performed.  Msk:  Symmetrical without gross deformities. Without edema, no deformity or joint abnormality.  Neurologic:  Alert and  oriented x4;  grossly normal neurologically.  Skin:   Dry and intact without significant lesions or rashes.  RELEVANT LABS AND IMAGING: CBC    Latest Ref Rng & Units 03/15/2024   12:00 AM 09/22/2023   10:46 AM 09/18/2023    3:27 AM  CBC  WBC  5.1     6.0  7.3   Hemoglobin 13.5 - 17.5 16.0     15.7  15.0   Hematocrit 41 - 53 49     47.2  46.7   Platelets 150 - 400 K/uL 171     134  149      This result is from an external source.     CMP     Latest Ref Rng & Units 10/12/2024    4:18 PM 04/17/2024   11:48 AM 03/15/2024   12:00 AM  CMP  Glucose 70 - 99 mg/dL 84  96    BUN 8 - 27 mg/dL 15  15  14       Creatinine 0.76 - 1.27 mg/dL 8.28  8.42  1.5      Sodium 134 - 144 mmol/L 139  142  143      Potassium 3.5 - 5.2 mmol/L 3.9  4.7  4.3      Chloride 96 - 106 mmol/L 101  106  104      CO2 20 - 29 mmol/L 21  20  24       Calcium 8.6 - 10.2 mg/dL 9.5  9.4  9.5      Alkaline Phos 25 - 125   88      AST 14 - 40   27      ALT 10 - 40 U/L   24         This result is from  an external source.     Lab Results  Component Value Date   TSH 0.68 08/01/2024    Imaging: 01/2024 US  abdomen RUQ IMPRESSION: 1. Increased and heterogeneous hepatic echotexture, most commonly seen with steatosis. Correlation with LFT's is recommended. 2.  Surgically absent gallbladder.  No biliary dilatation.  10/24 CTAP IMPRESSION: 1. No CT evidence for acute intra-abdominal or pelvic abnormality. 2. Gallstones. 3. 3.1 cm infrarenal abdominal aortic aneurysm. Recommend follow-up ultrasound every 3 years. (Ref.: J Vasc Surg. 2018; 67:2-77 and J Am Coll Radiol 2013;10(10):789-794.) 4. Enlarged prostate. 5. Mild diverticular disease of the colon without acute inflammation. 6. Aortic atherosclerosis.     Assessment: Encounter Diagnoses  Name Primary?   Abdominal discomfort Yes   Altered bowel habits    Gastroesophageal reflux disease without esophagitis    Dyspepsia    Diverticulosis of colon without hemorrhage    Hepatic steatosis    1 Chronic abdominal pain, suspect spastic colon, dicyclomine  constipates him will try anticholinergic hyoscyamine . Denies diarrhea or constipation today. Admits to poor diet. We  discussed dietary changes today and will provide print out. 01/2024 abd xray - showed scattered stool throughout. 01/2024 Abd u/s- showed fatty liver, otherwise normal exam 09/2023 CTAP- gallstones. Lap chole. Mild diverticular disease. 06/2024 normal EGD. 11/2021 colonoscopy- normal. -Switch to Hyoscyamine  0.125 mg SL prn   -stop dicyclomine  -discussed pursuing imaging , declined -discussed pursuing Sibo test, declined GERD, controlled -Continue esomeprazole   -Continue ginger chews  -Reinforced GERD diet 3  Dyspepsia, negative EGD  -continue current therapy 4. Altered bowel habits, reports normal today. Hx of bile salt diarrhea. -continue high fiber diet 5 Atrial fibrillation 6 Chronic anticoagulation -on xarelto  7. Hepatic steatosis Fatty liver infiltration noted  on recent ultrasound.  Extended serologic workup otherwise unrevealing.  Ultrasound elastography essentially normal in 02/2024.  Does have mild thrombocytopenia, but otherwise most recent set of liver enzymes with normal albumin, AST/ALT, ALP, T. bili.  -encouraged weight loss -dietary recommendations provided  - Dietitian referral, declined 8. Diverticulosis -high fiber diet  Thank you for the courtesy of this consult. Please call me with any questions or concerns.   Zaylynn Rickett, FNP-C [facility] 01/03/2025, 12:23 PM  Cc: Amon Aloysius BRAVO, MD  "

## 2025-01-03 ENCOUNTER — Ambulatory Visit: Admitting: Gastroenterology

## 2025-01-03 ENCOUNTER — Encounter: Payer: Self-pay | Admitting: Gastroenterology

## 2025-01-03 VITALS — BP 122/68 | HR 74 | Ht 70.0 in | Wt 218.0 lb

## 2025-01-03 DIAGNOSIS — D696 Thrombocytopenia, unspecified: Secondary | ICD-10-CM | POA: Diagnosis not present

## 2025-01-03 DIAGNOSIS — R109 Unspecified abdominal pain: Secondary | ICD-10-CM

## 2025-01-03 DIAGNOSIS — K760 Fatty (change of) liver, not elsewhere classified: Secondary | ICD-10-CM

## 2025-01-03 DIAGNOSIS — K5730 Diverticulosis of large intestine without perforation or abscess without bleeding: Secondary | ICD-10-CM | POA: Diagnosis not present

## 2025-01-03 DIAGNOSIS — R1013 Epigastric pain: Secondary | ICD-10-CM | POA: Diagnosis not present

## 2025-01-03 DIAGNOSIS — Z7901 Long term (current) use of anticoagulants: Secondary | ICD-10-CM | POA: Diagnosis not present

## 2025-01-03 DIAGNOSIS — R194 Change in bowel habit: Secondary | ICD-10-CM

## 2025-01-03 DIAGNOSIS — K219 Gastro-esophageal reflux disease without esophagitis: Secondary | ICD-10-CM

## 2025-01-03 MED ORDER — HYOSCYAMINE SULFATE 0.125 MG SL SUBL: 0.1250 mg | SUBLINGUAL_TABLET | Freq: Three times a day (TID) | SUBLINGUAL | 0 refills | Status: AC | PRN

## 2025-01-03 NOTE — Patient Instructions (Addendum)
 Diverticulosis Pamphlet attached  Diverticular spasm refers to the muscle contractions in the colon that can cause symptoms like cramping pain.  GERD  Continue esomeprazole  40 mg po daily Continue ginger chews as needed Recommend GERD diet  Abdominal discomfort Can try new medication hyoscyamine  prn as needed for abdominal cramping Stop dicyclomine   Fatty liver Diets attached:  Recommend Mediterranean diet Weight loss Exercise as tolerated If you need further assistanc eplease let us  know and we can refer you to dietitian  [email or web address]   _______________________________________________________  If your blood pressure at your visit was 140/90 or greater, please contact your primary care physician to follow up on this.  _______________________________________________________  If you are age 39 or older, your body mass index should be between 23-30. Your Body mass index is 31.28 kg/m. If this is out of the aforementioned range listed, please consider follow up with your Primary Care Provider.  If you are age 54 or younger, your body mass index should be between 19-25. Your Body mass index is 31.28 kg/m. If this is out of the aformentioned range listed, please consider follow up with your Primary Care Provider.   ________________________________________________________  The [facility] providers would like to encourage you to use MYCHART to communicate with providers for non-urgent requests or questions.  Due to long hold times on the telephone, sending your provider a message by [facility] may be a faster and more efficient way to get a response.  Please allow 48 business hours for a response.  Please remember that this is for non-urgent requests.   _______________________________________________________  [facility] is using a team-based approach to care.  Your team is made up of your doctor and two to three APPS. Our APPS (Nurse Practitioners and Physician Assistants) work with your physician to ensure care continuity for you. They are fully qualified to address your health concerns and develop a treatment plan. They communicate directly with your gastroenterologist to care for you. Seeing the Advanced Practice Practitioners on your physician's team can help you by facilitating care more promptly, often allowing for earlier appointments, access to diagnostic testing, procedures, and other specialty referrals.   Thank you for trusting me with your gastrointestinal care. Deanna May, FNP-C

## 2025-01-15 ENCOUNTER — Ambulatory Visit: Admitting: Urology

## 2025-01-16 ENCOUNTER — Encounter: Admitting: Skilled Nursing Facility1

## 2025-01-19 ENCOUNTER — Ambulatory Visit

## 2025-01-23 ENCOUNTER — Ambulatory Visit: Admitting: Internal Medicine

## 2025-01-24 ENCOUNTER — Ambulatory Visit: Admitting: Podiatry

## 2025-03-14 ENCOUNTER — Encounter

## 2025-03-15 ENCOUNTER — Encounter

## 2025-05-03 ENCOUNTER — Ambulatory Visit: Admitting: Neurology

## 2025-06-13 ENCOUNTER — Encounter

## 2025-06-14 ENCOUNTER — Encounter

## 2025-09-12 ENCOUNTER — Encounter

## 2025-09-13 ENCOUNTER — Encounter

## 2025-12-13 ENCOUNTER — Encounter

## 2026-03-14 ENCOUNTER — Encounter

## 2026-06-13 ENCOUNTER — Encounter

## 2026-09-12 ENCOUNTER — Encounter
